# Patient Record
Sex: Female | Born: 1955
Health system: Southern US, Community
[De-identification: ages and names within clinical notes are randomized; demographics above are authoritative.]

## PROBLEM LIST (undated history)

## (undated) DIAGNOSIS — R0609 Other forms of dyspnea: Secondary | ICD-10-CM

## (undated) DIAGNOSIS — Z9981 Dependence on supplemental oxygen: Secondary | ICD-10-CM

## (undated) DIAGNOSIS — U099 Post covid-19 condition, unspecified: Secondary | ICD-10-CM

## (undated) DIAGNOSIS — R0600 Dyspnea, unspecified: Secondary | ICD-10-CM

## (undated) DIAGNOSIS — F32A Depression, unspecified: Secondary | ICD-10-CM

## (undated) DIAGNOSIS — I739 Peripheral vascular disease, unspecified: Secondary | ICD-10-CM

## (undated) DIAGNOSIS — F419 Anxiety disorder, unspecified: Secondary | ICD-10-CM

## (undated) DIAGNOSIS — J449 Chronic obstructive pulmonary disease, unspecified: Secondary | ICD-10-CM

## (undated) DIAGNOSIS — I209 Angina pectoris, unspecified: Secondary | ICD-10-CM

## (undated) DIAGNOSIS — I2510 Atherosclerotic heart disease of native coronary artery without angina pectoris: Secondary | ICD-10-CM

## (undated) DIAGNOSIS — I7090 Unspecified atherosclerosis: Secondary | ICD-10-CM

## (undated) DIAGNOSIS — C801 Malignant (primary) neoplasm, unspecified: Secondary | ICD-10-CM

## (undated) DIAGNOSIS — J189 Pneumonia, unspecified organism: Secondary | ICD-10-CM

## (undated) DIAGNOSIS — R079 Chest pain, unspecified: Secondary | ICD-10-CM

## (undated) HISTORY — PX: COLONOSCOPY: SHX5424

## (undated) HISTORY — PX: ESOPHAGOGASTRODUODENOSCOPY: SHX1529

## (undated) HISTORY — DX: Anxiety disorder, unspecified: F41.9

## (undated) HISTORY — PX: COLONOSCOPY: SHX174

## (undated) HISTORY — DX: Chest pain, unspecified: R07.9

## (undated) HISTORY — DX: Unspecified atherosclerosis: I70.90

## (undated) HISTORY — DX: Malignant (primary) neoplasm, unspecified: C80.1

## (undated) HISTORY — PX: SPINE SURGERY: SHX786

---

## 1976-07-18 HISTORY — PX: ABDOMINAL HYSTERECTOMY: SHX81

## 1986-07-18 HISTORY — PX: OVARIAN CYST REMOVAL: SHX89

## 1997-07-18 HISTORY — PX: NECK SURGERY: SHX720

## 1998-07-30 ENCOUNTER — Inpatient Hospital Stay (HOSPITAL_COMMUNITY): Admission: RE | Admit: 1998-07-30 | Discharge: 1998-07-31 | Payer: Self-pay | Admitting: Neurosurgery

## 1998-07-30 ENCOUNTER — Encounter: Payer: Self-pay | Admitting: Neurosurgery

## 2000-05-30 ENCOUNTER — Encounter: Admission: RE | Admit: 2000-05-30 | Discharge: 2000-05-30 | Payer: Self-pay | Admitting: Internal Medicine

## 2000-05-30 ENCOUNTER — Encounter: Payer: Self-pay | Admitting: Internal Medicine

## 2002-05-16 ENCOUNTER — Inpatient Hospital Stay (HOSPITAL_COMMUNITY): Admission: AD | Admit: 2002-05-16 | Discharge: 2002-05-25 | Payer: Self-pay | Admitting: Internal Medicine

## 2002-05-17 ENCOUNTER — Encounter: Payer: Self-pay | Admitting: Internal Medicine

## 2002-05-19 ENCOUNTER — Encounter (INDEPENDENT_AMBULATORY_CARE_PROVIDER_SITE_OTHER): Payer: Self-pay | Admitting: *Deleted

## 2002-05-20 ENCOUNTER — Encounter: Payer: Self-pay | Admitting: *Deleted

## 2002-05-21 ENCOUNTER — Encounter (INDEPENDENT_AMBULATORY_CARE_PROVIDER_SITE_OTHER): Payer: Self-pay | Admitting: Cardiology

## 2002-08-06 ENCOUNTER — Encounter: Payer: Self-pay | Admitting: Internal Medicine

## 2002-08-06 ENCOUNTER — Encounter: Admission: RE | Admit: 2002-08-06 | Discharge: 2002-08-06 | Payer: Self-pay | Admitting: Internal Medicine

## 2002-08-23 ENCOUNTER — Ambulatory Visit (HOSPITAL_COMMUNITY): Admission: RE | Admit: 2002-08-23 | Discharge: 2002-08-23 | Payer: Self-pay | Admitting: Obstetrics and Gynecology

## 2002-08-23 ENCOUNTER — Encounter: Payer: Self-pay | Admitting: Obstetrics and Gynecology

## 2002-10-08 ENCOUNTER — Encounter: Payer: Self-pay | Admitting: Internal Medicine

## 2002-10-08 ENCOUNTER — Encounter: Admission: RE | Admit: 2002-10-08 | Discharge: 2002-10-08 | Payer: Self-pay | Admitting: Internal Medicine

## 2002-10-28 ENCOUNTER — Emergency Department (HOSPITAL_COMMUNITY): Admission: EM | Admit: 2002-10-28 | Discharge: 2002-10-28 | Payer: Self-pay

## 2002-10-28 ENCOUNTER — Encounter: Payer: Self-pay | Admitting: Emergency Medicine

## 2003-07-19 DIAGNOSIS — Z5189 Encounter for other specified aftercare: Secondary | ICD-10-CM

## 2003-07-19 HISTORY — DX: Encounter for other specified aftercare: Z51.89

## 2004-12-09 ENCOUNTER — Ambulatory Visit: Payer: Self-pay | Admitting: Cardiology

## 2004-12-09 ENCOUNTER — Encounter: Payer: Self-pay | Admitting: Cardiology

## 2004-12-09 ENCOUNTER — Inpatient Hospital Stay (HOSPITAL_COMMUNITY): Admission: EM | Admit: 2004-12-09 | Discharge: 2004-12-11 | Payer: Self-pay | Admitting: Emergency Medicine

## 2004-12-10 ENCOUNTER — Encounter (INDEPENDENT_AMBULATORY_CARE_PROVIDER_SITE_OTHER): Payer: Self-pay | Admitting: Specialist

## 2004-12-29 ENCOUNTER — Encounter (HOSPITAL_COMMUNITY): Admission: RE | Admit: 2004-12-29 | Discharge: 2005-03-29 | Payer: Self-pay | Admitting: Cardiology

## 2005-12-09 ENCOUNTER — Inpatient Hospital Stay (HOSPITAL_COMMUNITY): Admission: AD | Admit: 2005-12-09 | Discharge: 2005-12-09 | Payer: Self-pay | Admitting: Obstetrics and Gynecology

## 2005-12-11 ENCOUNTER — Inpatient Hospital Stay (HOSPITAL_COMMUNITY): Admission: AD | Admit: 2005-12-11 | Discharge: 2005-12-11 | Payer: Self-pay | Admitting: Obstetrics and Gynecology

## 2007-09-27 ENCOUNTER — Encounter: Admission: RE | Admit: 2007-09-27 | Discharge: 2007-09-27 | Payer: Self-pay | Admitting: Neurosurgery

## 2007-10-11 ENCOUNTER — Encounter: Admission: RE | Admit: 2007-10-11 | Discharge: 2007-10-11 | Payer: Self-pay | Admitting: Neurosurgery

## 2007-10-18 ENCOUNTER — Ambulatory Visit (HOSPITAL_BASED_OUTPATIENT_CLINIC_OR_DEPARTMENT_OTHER): Admission: RE | Admit: 2007-10-18 | Discharge: 2007-10-18 | Payer: Self-pay | Admitting: Orthopedic Surgery

## 2008-02-11 DIAGNOSIS — F32A Depression, unspecified: Secondary | ICD-10-CM | POA: Insufficient documentation

## 2008-07-18 HISTORY — PX: CHOLECYSTECTOMY: SHX55

## 2008-10-03 ENCOUNTER — Inpatient Hospital Stay (HOSPITAL_COMMUNITY): Admission: EM | Admit: 2008-10-03 | Discharge: 2008-10-06 | Payer: Self-pay | Admitting: Emergency Medicine

## 2010-05-21 DIAGNOSIS — Z86718 Personal history of other venous thrombosis and embolism: Secondary | ICD-10-CM | POA: Insufficient documentation

## 2010-08-10 DIAGNOSIS — M109 Gout, unspecified: Secondary | ICD-10-CM | POA: Insufficient documentation

## 2010-10-28 LAB — CARDIAC PANEL(CRET KIN+CKTOT+MB+TROPI)
CK, MB: 2.4 ng/mL (ref 0.3–4.0)
Relative Index: INVALID (ref 0.0–2.5)
Total CK: 108 U/L (ref 7–177)
Troponin I: <0.01 ng/mL (ref 0.00–0.06)
Troponin I: <0.01 ng/mL (ref 0.00–0.06)

## 2010-10-28 LAB — BASIC METABOLIC PANEL
CO2: 22 mEq/L (ref 19–32)
Creatinine, Ser: 0.75 mg/dL (ref 0.4–1.2)
GFR calc non Af Amer: >60 mL/min (ref >60)
Glucose, Bld: 99 mg/dL (ref 70–99)

## 2010-10-28 LAB — CBC
Hemoglobin: 13.7 g/dL (ref 12.0–15.0)
MCHC: 34.8 g/dL (ref 30.0–36.0)
MCHC: 35.1 g/dL (ref 30.0–36.0)
MCV: 95.1 fL (ref 78.0–100.0)
Platelets: 208 10*3/uL (ref 150–400)
RDW: 13.9 % (ref 11.5–15.5)

## 2010-10-28 LAB — DIFFERENTIAL
Basophils Absolute: 0 10*3/uL (ref 0.0–0.1)
Eosinophils Absolute: 0.1 10*3/uL (ref 0.0–0.7)
Eosinophils Relative: 1 % (ref 0–5)
Monocytes Absolute: 0.5 10*3/uL (ref 0.1–1.0)

## 2010-10-28 LAB — PROTIME-INR
INR: 1 (ref 0.00–1.49)
INR: 1 (ref 0.00–1.49)
Prothrombin Time: 12.9 seconds (ref 11.6–15.2)
Prothrombin Time: 13.3 seconds (ref 11.6–15.2)

## 2010-10-28 LAB — LIPID PANEL
HDL: 31 mg/dL — ABNORMAL LOW (ref >39)
LDL Cholesterol: 83 mg/dL (ref 0–99)
Total CHOL/HDL Ratio: 4.4 RATIO

## 2010-10-28 LAB — CK TOTAL AND CKMB (NOT AT ARMC)
CK, MB: 2.2 ng/mL (ref 0.3–4.0)
Total CK: 49 U/L (ref 7–177)

## 2010-10-28 LAB — CULTURE, BLOOD (ROUTINE X 2)

## 2010-10-28 LAB — COMPREHENSIVE METABOLIC PANEL
BUN: 14 mg/dL (ref 6–23)
CO2: 24 mEq/L (ref 19–32)
Calcium: 8 mg/dL — ABNORMAL LOW (ref 8.4–10.5)
GFR calc Af Amer: >60 mL/min (ref >60)

## 2010-10-28 LAB — POCT CARDIAC MARKERS
Myoglobin, poc: 94 ng/mL (ref 12–200)
Troponin i, poc: <0.05 ng/mL (ref 0.00–0.09)

## 2010-10-28 LAB — APTT
aPTT: 30 seconds (ref 24–37)
aPTT: 32 seconds (ref 24–37)

## 2010-10-28 LAB — C-REACTIVE PROTEIN: CRP: 0.3 mg/dL — ABNORMAL LOW (ref <0.6)

## 2010-11-30 NOTE — Op Note (Signed)
Maria [name], SCHILLACI                ACCOUNT NO.:  [id number]   MEDICAL RECORD NO.:  [id number]          PATIENT TYPE:  AMB   LOCATION:  DSC                          FACILITY:  [facility]   PHYSICIAN:  Eulas Post, MD    DATE OF BIRTH:  [birth year]   DATE OF PROCEDURE:  10/18/2007  DATE OF DISCHARGE:                               OPERATIVE REPORT   PREOPERATIVE DIAGNOSIS:  Right adhesive capsulitis.   POSTOPERATIVE DIAGNOSIS:  Right adhesive capsulitis.   OPERATIVE PROCEDURE:  1. Right shoulder manipulation under anesthesia.  2. Right shoulder intra-articular injection of Depo-Medrol and      Marcaine.   ANESTHESIA:  General with a regional block.   OPERATIVE FINDINGS:  Preoperatively in the clinic she could only forward  flex the shoulder to about 30 degrees and she could externally rotate to  -10.  Once she was asleep I could forward  flex her to about 110 degrees  and externally rotate to about 25.  After the manipulations her forward  flexion was  to 180 degrees and she externally rotated at 0 degrees of  abduction to 70 degrees.  Her external rotation at 90 degrees of  abduction was to 100 degrees.  The internal rotation at 90 degrees was  to 10 degrees of internal rotation.   OPERATIVE PROCEDURE:  The patient was brought to the operating room and  placed in the supine position.  An interscalene block was administered.  The general anesthesia was then administered.  Manipulation under  anesthesia was performed.  First the arm was brought into external  rotation with the arm at its side.  Then we forwarded flexed maximally.  Satisfactory lysis of the adhesions was achieved audibly.  All of the  above range of motions were tested.  Cross-arm adduction was also  satisfactory.  We then prepped the Neviaser portal and injected a total  of 9 mL of Marcaine and 1 mL of Depo-Medrol into the shoulder.  This was  into the glenohumeral joint itself.  The patient tolerated the procedure  well and there was no complications.  She returned to the PACU with her  right arm elevated over her head with her hand behind her head.  She is  going to plan to begin physical therapy tomorrow to try and maintain the  motion that we have achieved here in the operating room.      Eulas Post, MD  Electronically Signed     JPL/MEDQ  D:  10/18/2007  T:  10/18/2007  Job:  [id number]

## 2010-11-30 NOTE — Discharge Summary (Signed)
NAMEKATHLEENA, [name]                ACCOUNT NO.:  [id number]   MEDICAL RECORD NO.:  [id number]          PATIENT TYPE:  INP   LOCATION:  5153                         FACILITY:  [facility]   PHYSICIAN:  Mohan N. Sharyn Lull, M.D. DATE OF BIRTH:  08/24/1955   DATE OF ADMISSION:  10/02/2008  DATE OF DISCHARGE:  10/06/2008                               DISCHARGE SUMMARY   ADMITTING DIAGNOSES:  1. Chest pain, rule out myocardial infarction.  2. Tobacco abuse.  3. Depression.  4. History of pulmonary embolism.  5. Positive family history of coronary artery disease.   DISCHARGE DIAGNOSES:  1. Possible early resolving pneumonia.  2. History of tobacco abuse.  3. Depression.  4. History of pulmonary embolism in the past.  5. Positive family history of coronary artery disease.   DISCHARGE HOME MEDICATIONS:  1. Enteric-coated aspirin 81 mg 1 tablet daily.  2. Avelox 400 mg 1 tablet daily for 7 days.  3. Nitrostat 0.4 mg sublingual use as directed.  4. Amitriptyline 1 tablet daily at night as before.   DIET:  Low-salt, low-cholesterol.   Increase activity as tolerated.   Repeat chest x-ray in 1 week.   Follow up with me in 1 week.   CONDITION AT DISCHARGE:  Stable.   BRIEF HISTORY AND HOSPITAL COURSE:  [name] is a 55 year old white  female with past medical history significant for depression, tobacco  abuse, history of PE in the past, morbid obesity, and positive family  history of coronary artery disease.  She came to the ER complaining of  retrosternal chest pain as if someone is sitting on the chest.  The  patient described the chest pain as sharp and increases occasionally  with deep breathing and radiating to the left arm, associated with  nausea and mild shortness of breath and diaphoresis.  The patient denies  exertional chest pain.  Denies palpitation, lightheadedness, or syncope.  Denies chest pain at present.  States had pulmonary embolism  approximately 6 or 7 years ago and  took Coumadin for less than 1 year.  The patient denies any cough, fever, or chills.  Denies any urinary  complaints.   PAST MEDICAL HISTORY:  As above.   PAST SURGICAL HISTORY:  Had hysterectomy in the past, cholecystectomy in  the past, and tonsillectomy in the past.   SOCIAL HISTORY:  She is married, has 4 children.  Smoked 1-pack per day  for 40 years.  No history of alcohol abuse.  Worked in Estate manager/land agent.   FAMILY HISTORY:  Positive for coronary artery disease.   PHYSICAL EXAMINATION:  GENERAL:  She is alert, awake, and oriented x3,  in no acute distress.  VITAL SIGNS:  Blood pressure 94/59, pulse was 59 and regular, and  afebrile.  HEENT:  Conjunctivae pink.  NECK: Supple.  No JVD.  No bruit.  LUNGS:  Clear to auscultation without rhonchi or rales.  CARDIOVASCULAR:  S1 and S2 was normal.  There was soft systolic murmur.  ABDOMEN:  Soft.  Bowel sounds are present and nontender.  EXTREMITIES:  There is no clubbing, cyanosis, or  edema.   LABORATORIES:  CT of the chest showed no evidence of dissection nor PE.  EKG showed normal sinus rhythm with nonspecific T-wave changes.  Her  hemoglobin was 13.7, hematocrit 39.1, and white count of 12.7.  Today's  labs; hemoglobin is 11.2, hematocrit 32.3, and white count of 8.6.  Blood cultures have been negative.  Cholesterol is 136, triglyceride  111, HDL 31 which is low, and LDL is 83.  C-reactive protein is 0.3.  Two sets of cardiac enzymes were negative.  Chest x-ray showed  questionable early developing right lower lobe infiltrate, chronic  interstitial lung changes.  Persantine Myoview scan showed no evidence  of reversible ischemia with EF of 74%.   BRIEF HISTORY AND HOSPITAL COURSE:  The patient was admitted to  Telemetry Unit.  MI was ruled out by serial enzymes and EKG.  The  patient subsequently underwent Persantine Myoview, which showed no  evidence of reversible ischemia as above.  The patient developed cough   and pleuritic chest pain, was started on IV Rocephin with improvement in  her symptoms.  The patient is eager to go home and all dressed up.  The  patient remained afebrile during the hospital stay.  We will change her  Rocephin IV to Avelox p.o.  The patient is advised to go to ED if she  develops high-grade fever or worsening cough with mucus production or  high-grade fever.  The patient will be discharged home on above  medications.  We will repeat the chest x-ray in 1 week.  The patient has  been advised to avoid smoking to which she agrees.  The patient will be  followed up in my office in 1 week.      Eduardo Osier. Sharyn Lull, M.D.  Electronically Signed     MNH/MEDQ  D:  10/06/2008  T:  10/07/2008  Job:  [id number]

## 2010-12-03 NOTE — Discharge Summary (Signed)
[name], Maria [name]                ACCOUNT NO.:  [id number]   MEDICAL RECORD NO.:  [id number]          PATIENT TYPE:  INP   LOCATION:  4743                         FACILITY:  [facility]   PHYSICIAN:  Corinna L. Lendell Caprice, MDDATE OF BIRTH:  Aug 12, 1955   DATE OF ADMISSION:  12/08/2004  DATE OF DISCHARGE:  12/11/2004                                 DISCHARGE SUMMARY   DISCHARGE DIAGNOSES:  1.  Chest slash/abdominal pain secondary to biliary colic.  2.  Cholelithiasis.  3.  Chronic cholecystitis.  4.  A history of pulmonary embolus.  5.  History of hematoma of the neck requiring  emergent tracheostomy.  6.  Tobacco abuse, counseled against.  7.  Status post laparoscopic cholecystectomy by Dr. Abbey Chatters on      12/10/2004.   CONSULTATIONS:  Dr. Luther Parody, Dr. Abbey Chatters, Dr. Sharyn Lull.   DISCHARGE MEDICATIONS:  Tylox one to two p.o. q.4 h p.r.n. pain.  She may  continue her other outpatient medications.   PROCEDURE:  As above.   FOLLOW UP:  Follow up with Dr. Abbey Chatters in 2 weeks.   ACTIVITY:  No heavy exertion or lifting for 2 weeks.   PERTINENT LABORATORY DATA:  Multiple cardiac enzymes were negative. A  complete metabolic panel was unremarkable. Coagulation panel was normal.  D-  dimer 0.48. CBC unremarkable. A CT of the chest  showed no pulmonary embolus, mild COPD, cholelithiasis with mild  intrahepatic and extrahepatic biliary ductal dilatation. chest x-ray  Negative. Ultrasound of the abdomen showed cholelithiasis and common bile  duct dilatation, suggesting common bile duct stones. Repeat chest x-ray  showed minimal basilar atelectasis. Multiple EKGs showed initially normal  sinus rhythm with possible anterior infarct. Subsequent EKG showed sinus  bradycardia. Echocardiogram showed ejection fraction of 55-65%. No regional  wall motion abnormalities, cholangiogram intraoperatively showed no stones   HISTORY AND HOSPITAL COURSE:  [name] is a pleasant 55 year old white  female, unassigned patient who presented with atypical epigastric and  substernal chest pressure and pain. She ruled out for myocardial infarction  and had a normal echocardiogram. Given her cholelithiasis and dilated common  duct there was concern that the pain may be due to biliary colic. GI was  consulted and recommended surgery consult. Surgery agreed and performed a  laparoscopic cholecystectomy. When the patient awoke the PACU area she began  complaining of 10 at of 10 severe  crushing substernal chest pressure. Workup was negative. However, cardiology  was consulted and felt that this was not a cardiac issue but that supportive  care should be done. The patient remained in sinus rhythm on telemetry he  had no further chest pain and is being discharged to home.      CLS/MEDQ  D:  12/11/2004  T:  12/11/2004  Job:  [id number]

## 2010-12-03 NOTE — Consult Note (Signed)
NAME:  Maria [name], Maria [name]                          ACCOUNT NO.:  [id number]   MEDICAL RECORD NO.:  [id number]                   PATIENT TYPE:  INP   LOCATION:  4706                                 FACILITY:  [facility]   PHYSICIAN:  Lowell C. Catha Gosselin, M.D.               DATE OF BIRTH:  07-13-1956   DATE OF CONSULTATION:  05/21/2002  DATE OF DISCHARGE:                                   CONSULTATION   REASON FOR CONSULTATION:  Hypercoagulable workup.   REFERRING PHYSICIAN:  Antony Madura, M.D.   HISTORY OF PRESENT ILLNESS:  The patient is a 55 year old woman who  presented on May 16, 2002, with sudden onset of shortness of breath and  chest pain.  Chest CT (done at [facility]) revealed pulmonary embolism  with a fairly large filling defect demonstrated in the proximal right  pulmonary artery.  The patient was admitted to [facility] and  started on heparin.  Abdominal/pelvic CTs were done to evaluate for a  possible occult malignancy with findings of biliary obstruction,  cholelithiasis, COPD, linear atelectasis or scar formation both posterior  lung bases and two possible small rectal masses posteriorly on the right.  A  GI consult was obtained with the patient evaluated by Dr. Luther Parody.  She  underwent EGD which showed a questionable prominent ampulla which was  brushed with cytology from the [id number] (negative for malignant cells) and  an otherwise normal examination.  Colonoscopy was negative with no pathology  seen in the rectal area.  MRCP showed common bile duct dilatation down to  the distal most common bile duct and extensive cholelithiasis.  Pelvic  ultrasound showed no evidence of pelvic or adnexal mass, normal appearance  of the right ovary, nonvisualization of the left ovary, and patient noted to  be status post hysterectomy.  Lower extremity venous Dopplers were negative  for DVT.  Results of a two-dimensional echocardiogram are pending.  Surgery  is  following due to cholelithiasis, elevated LFTs with no surgery planned  presently.  The patient is currently heparin and being converted to  Coumadin.   PAST MEDICAL HISTORY:  1. Migraines.  2. Status post partial hysterectomy in 1979; status post what the patient     thought was removal of both ovaries 15 years ago.  3. Status post neck surgery.   MEDICATIONS:  Heparin and Coumadin.   HOME MEDICATIONS:  Herbal estrogen product for the past two weeks.  Prior to  that the patient had been on a prescription estrogen medication.   ALLERGIES:  No known drug allergies.   FAMILY HISTORY:  Mother is living and has a history of diabetes mellitus,  MI, CAD status post CABG. Father has a history of COPD; four brothers and  one sister living all reported to be in good health.  Maternal grandmother  deceased with stomach cancer.   SOCIAL HISTORY:  The patient lives  in [place]. She is married.  She has  four children. She is employed as a Sport and exercise psychologist. She reports a  positive tobacco history stating that she quit smoking six weeks ago at 1/2  pack per day for 35 years. She denies EtOH use.    GYN HISTORY:  1. Pap/pelvic examination in September of 2003; negative.  2. Mammogram in November of 2001; negative.  3. Hormone replacement therapy for 15 years up until approximately two to     four weeks ago at which time she started an over-the-counter herbal     estrogen supplement.   She denies no personal or family history of miscarriage/difficulty  conceiving.   REVIEW OF SYSTEMS:  The patient denies any weight loss or anorexia. Her  energy has been poor for the past several weeks. She denies any fever or  nightsweats.  On the day of admission, she reports sudden onset of chest  pain and shortness of breath.  She denies any unusual headaches or visual  changes. She denies any bleeding. She denies any calf pain or lower  extremity swelling.  She has had no recent change in her  bowel habits and  denies any rectal bleeding. She has had no hematuria or dysuria.  She denies  any vaginal discharge or bleeding. She has noticed no breast masses.  She  does report breast tenderness approximately two weeks.   PHYSICAL EXAMINATION:  VITAL SIGNS:  Temperature 99.2, heart rate 60,  respirations 20, blood pressure 85/51, oxygen saturation 97% on room air.  GENERAL:  Pleasant, well-nourished, Caucasian female in no acute distress.  HEENT:  Normocephalic and atraumatic.  Pupils equal, round, and reactive to  light.  Extraocular movements are intact.  Sclerae anicteric.  Oropharynx is  clear; full upper dentures, partial lower plate.  LYMPH: No palpable cervical, supraclavicular, axillary, or inguinal lymph  nodes.  CHEST:  Breath sounds are markedly diminished.  No breast masses palpated.  HEART:  Regular rate and rhythm.  ABDOMEN:  Soft, tender over the right upper quadrant.  EXTREMITIES:  No edema; calves are soft and nontender.  NEUROLOGY:  Alert and oriented x3.  Motor strength is 5/5.   LABORATORY DATA:  Hemoglobin 10.7, white count 7, platelets 206,000, MCV 96,  total bilirubin 0.8, alkaline phosphatase 92, SGOT 64, SGPT 174, total  protein 5.7, albumin 3.0. CEA less than 0.5 and AFP 3.9.   ADMISSION LABS:  Hemoglobin 12.2, white count 9.3, platelets 265,000. Sodium  135, potassium 3.5, BUN 13, creatinine 0.8, glucose 97, calcium 8.2, total  protein 6.1, albumin 3.2, alkaline phosphatase 50, SGOT 37, SGPT 30.   RADIOLOGY:  1. Chest CT; large filling defect proximal right pulmonary artery. There may     be some minimal clot formation in the proximal left pulmonary artery as     well.  2. Abdominal CT; biliary obstruction, cholelithiasis, linear atelectasis or     scar formation at both posterior lung bases, COPD.  3. Pelvic CT; two possible small rectal masses with associated mild     perirectal adenopathy.  IMPRESSION:  The patient is a 55 year old woman who  presented with sudden  onset of chest pain and dyspnea and was found to have pulmonary emboli, as  well as biliary obstruction and cholelithiasis.  Two weeks prior she had  started an over-the-counter herbal estrogen product.   Lower extremity Dopplers were negative for evidence of DVT.  Colonoscopy was  negative.  EGD with question of prominent ampulla with negative  cytology.  Pelvic ultrasound was negative for mass.  Two-dimensional echocardiogram  results are pending. We will begin a hypercoagulable workup and chest factor  V Leiden, prothrombin gene mutation, and anticardiolipin antibody panel.  She will need at least six months of Coumadin therapy.  If she is found to  have a hypercoagulable state, she would need lifelong anticoagulation.  We  will arrange for her to have follow-up in approximately four weeks with Dr.  Truett Perna to follow-up on the pending studies and potentially check an AT3  level (off heparin). We cannot check protein S and C levels until she is off  of Coumadin.  We would also recommend that she have a mammogram as an  outpatient.  We will continue to follow with you. The patient is seen and  examined by Dr. Catha Gosselin; chart reviewed.     Lonna Cobb, N.P.                         Quintin Alto C. Catha Gosselin, M.D.    LT/MEDQ  D:  05/23/2002  T:  05/24/2002  Job:  [id number]   cc:   Antony Madura, M.D.  [address]  [place]  [place] [address]  Fax: [phone]   Velora Heckler, M.D.  [phone] [address] [place]  [place] [address]  Fax: [phone]   Althea Grimmer. Luther Parody, M.D.  [address]  [place]  [place] [address]  Fax: [phone]

## 2010-12-03 NOTE — Consult Note (Signed)
NAME:  Maria [name], Maria [name]                          ACCOUNT NO.:  [id number]   MEDICAL RECORD NO.:  [id number]                   PATIENT TYPE:  INP   LOCATION:  4706                                 FACILITY:  [facility]   PHYSICIAN:  Althea Grimmer. Luther Parody, M.D.            DATE OF BIRTH:  [birth year]   DATE OF CONSULTATION:  05/18/2002  DATE OF DISCHARGE:                                   CONSULTATION   REASON FOR CONSULTATION:  The patient is a 55 year old female who entered  the hospital on May 16, 2002 with sudden onset of substernal chest  discomfort and shortness of breath.  This was established to be secondary to  pulmonary embolism seen on chest CT and she is currently heparinized.  She  denies any obvious overt reason for her pulmonary emboli.  She has no  gastrointestinal symptoms, extremity swelling, trauma, or cellulitis.  She  specifically denies change in appetite, dysphagia, heartburn, epigastric or  lower abdominal pain, constipation, diarrhea, melena, or hematochezia.  She  says she has recently lost 10 pounds through intentional dieting.  She has  been a smoker and quit smoking five years ago with the use of an over-the-  counter herbal supplement.  In addition, she is status post hysterectomy in  the remote past.  She was on prescribed estrogen supplements but began  taking an over-the-counter herbal estrogen several weeks ago.   PAST MEDICAL HISTORY:  Pertinent for migraines.   PAST SURGICAL HISTORY:  Total hysterectomy.  It is noted that her ovaries  were removed, yet the recent pelvic CT suggests that ovaries are still in  place and this needs to be sorted out with the radiologist.]   CURRENT MEDICATIONS:  At home include natural estrogen.  In hospital, she  has been on heparin drip.  Coumadin has just been started and she is on  Xanax.   ALLERGIES:  She denies any allergies.   FAMILY HISTORY:  Notable for a mother with gallstones and a father with  ulcer disease  requiring partial gastrectomy and two grandparents with  stomach cancer.   REVIEW OF SYMPTOMS:  GENERAL:  A 10 pound intentional weight loss.  No night  sweats.  ENDOCRINE:  No known diabetes or thyroid problems.  SKIN:  No rash  or pruritus.  EYES:  No icterus or change in vision.  ENT:  No aphthous,  ulcers, or chronic sore throat.  RESPIRATORY:  Recent onset of shortness of  breath with exertion.  See above.  CARDIAC:  No palpitations or history of  valvular heart disease.  GASTROINTESTINAL:  As above.  GENITOURINARY:  No  dysuria or hematuria.  Remainder of review of systems is negative.   PHYSICAL EXAMINATION:  GENERAL:  She is a well-developed, well-nourished  adult female in no acute distress.  VITAL SIGNS:  Afebrile.  Blood pressure 99/58, pulse is 55 and regular.  SKIN:  Normal.  HEENT:  Eyes are anicteric.  Oropharynx reveals upper dentures without  aphthous ulcers.  NECK:  Supple without thyromegaly or adenopathy.  There is no cervical or  inguinal adenopathy.  CHEST:  Clear.  HEART:  Regular rate and rhythm without murmurs, rubs, or gallops.  ABDOMEN:  Soft without mass or organomegaly.  There is mild right lower  quadrant tenderness to deep palpation.  There is no rebound or hernia.  Bowel sounds are normal.  RECTAL:  Not performed.  EXTREMITIES:  Without clubbing, cyanosis, edema, or rash.   LABORATORY DATA:  Laboratory test reveal hemoglobin of 12.1 with a white  blood count of 9.4, platelet count is normal.  Prothrombin time is 14, PTT  161, albumin 3.2.  Liver function tests are completely normal.  CT scan  suggests COPD with pulmonary emboli.  Abdominal CT scan demonstrates biliary  dilatation with a common bile duct of 12.6 mm.  There are stones within the  gallbladder.  There is no pancreatic head mass seen.  There are perirectal  small masses versus adenopathy seen.   IMPRESSION:  A 55 year old female who presented with pulmonary emboli of  unclear etiology.   Possibilities would include an undetected malignancy or  possible even these herbal supplements which she has been using which could  affect her coagulation status.  Her CT scan of the abdomen and pelvis is of  some concern in that the common bile duct is considerably dilated and she  does have gallstones, yet no obstruction point is seen.  The question would  be raised of an ampullary mass.  Her family history of stomach cancer and  ulcer disease is also of concern.  The CT scan of the rectum raises a  question of a rectal masses or perirectal adenopathy.   RECOMMENDATIONS:  I believe the patient should have both upper and lower  endoscopy in order to rule out neoplastic lesions of the bowel, the stomach,  or the ampulla.  She may indeed require ERCP or MRCP to further sort out the  reason for dilation of her bowel duct.  Her gallstones at this point seem to  be asymptomatic.  Both upper and lower endoscopy are discussed with the  patient in terms of technique, preparation, and risks of complications  including bleeding and perforation.  She agrees to proceed and they will be  performed tomorrow morning after a clear liquid diet and Phospho-Soda prep.  Please see the orders.  Her heparin will be held beginning at 4 a.m.  May 19, 2002 and Coumadin should not be started until her  gastrointestinal evaluation is completed.  The CT scan will be reviewed with  the radiologist in order to clarify what he read as ovaries which should not  be present.                                                Althea Grimmer. Luther Parody, M.D.    PJS/MEDQ  D:  05/18/2002  T:  05/20/2002  Job:  [id number]   cc:   Antony Madura, M.D.  [address]  [place]  [place] [address]  Fax: [phone]

## 2010-12-03 NOTE — Consult Note (Signed)
NAME:  Maria [name], Maria [name] NO.:  [id number]   MEDICAL RECORD NO.:  [id number]                   PATIENT TYPE:  INP   LOCATION:  4706                                 FACILITY:  [facility]   PHYSICIAN:  Velora Heckler, M.D.                DATE OF BIRTH:  08-Aug-1955   DATE OF CONSULTATION:  05/20/2002  DATE OF DISCHARGE:                                   CONSULTATION   PRIMARY CARE PHYSICIAN:  Antony Madura, M.D.   REASON FOR CONSULTATION:  Cholelithiasis, chest pain, abnormal liver  function tests, biliary dilatation.   HISTORY OF PRESENT ILLNESS:  The patient is a 55 year old white female in  her normal state of good health until May 16, 2002, when she experienced  sudden onset of substernal chest pain. The patient notes pain to be  substernal and right anterior chest wall radiating directly to the back.  This occurred with concurrent shortness of breath.  The patient was seen by  Dr. Su Hilt and underwent CT scan of the chest at [facility]. This  demonstrated a right pulmonary artery embolism.  The patient was admitted  emergently and started on a heparin infusion.  Work-up for source of  pulmonary embolism included laboratory studies, a CT scan of the chest, a CT  scan of the abdomen, an upper and lower endoscopy.  CT scan of the abdomen  demonstrated a dilated common bile duct at 12 mm.  The patient, on May 19, 2002, developed mildly elevated liver transaminases.  Subsequently MR  cholangiogram was performed today, May 20, 2002, which was negative for  common bile duct stones.  The patient does have gallstones. The patient also  underwent ERCP with brushings and cytology was negative for malignancy.  General surgery is now consulted for consideration of cholecystectomy and  recommendations regarding pulmonary embolism.   PAST MEDICAL HISTORY:  Status post abdominal hysterectomy 20 years ago.  The  patient was told both ovaries were  removed but pelvic CT scan suggests that  the right ovary remains and appears normal.   MEDICATIONS:  Estrogen supplementation.   ALLERGIES:  No known drug allergies.   SOCIAL HISTORY:  The patient is married.  She denies alcohol use.  She  denies drug abuse.  She is a smoker, having a one pack a day history for 35  years and quit four to five weeks ago while taking an herbal supplement.   FAMILY HISTORY:  This is notable for gallstones in the patient's mother and  sisters both of whom required cholecystectomy.   REVIEW OF SYSTEMS:  A 15-system review is without significant other  positives except as noted above.   PHYSICAL EXAMINATION:  GENERAL APPEARANCE:  A 55 year old well-developed,  well-nourished white female on ward 4700 at [place]. [facility].  VITAL SIGNS:  Afebrile.  Vital signs stable.  HEENT:  Normocephalic and atraumatic.  Sclerae are clear.  Dentition is  good.  Voice quality is normal.  NECK:  Symmetric. Thyroid is normal without nodularity.  There is no  anterior or posterior cervical adenopathy.  There are no supraclavicular  masses.  LUNGS:  Clear to auscultation, although breath sounds are diminished on the  right secondary to splinting due to pain. There is no rub on auscultation.  There is no costovertebral angle tenderness.  CARDIOVASCULAR:  Regular rate and rhythm.  ABDOMEN:  Soft without distention.  There are no masses.  There is no  tenderness.  There are bowel sounds present.  EXTREMITIES:  Nontender without edema.  There is no ecchymosis.  There are  no palpable cords in either calf.  There is no Homan's sign bilaterally.  NEUROLOGIC:  The patient is alert and oriented without focal deficit.   LABORATORY DATA:  May 19, 2002, white count 8.2, hemoglobin 12.4. SGOT  56, SGPT 219.   IMPRESSION:  1. Pulmonary embolus to right pulmonary artery of undetermined source or     etiology.  2. Cholelithiasis.  3. Elevated liver function  tests.  4. Dilated common bile duct.   PLAN:  1. Continue heparin drip.  2. Repeat liver function tests and serum amylase levels in a.m. on May 21, 2002.  3. Have ordered venous Duplex study and vascular laboratory of the lower     extremities to rule out deep venous thrombosis as source of pulmonary     embolus.  4. Consider hematology consult to work up procoagulable states including     protein C, protein S and antithrombin III among others.  5. Consider cardiac echo if venous Duplex of the lower extremities is     negative to rule out an intercardiac thrombus or patent foramen ovale.  6. If liver function tests decrease to normal range and the patient remains     asymptomatic from gallstones, I would hold off on cholecystectomy for the     foreseeable future and treat with therapeutic anticoagulation for six     months or as per recommended by hematology.  Certainly if the patient     becomes symptomatic from her underlying cholelithiasis, we will have to     consider interrupting her anticoagulation and preceding with     cholecystectomy and cholangiography.                                               Velora Heckler, M.D.    TMG/MEDQ  D:  05/20/2002  T:  05/21/2002  Job:  [id number]   cc:   Antony Madura, M.D.  [address]  [place]  [place] [address]  Fax: [phone]   Althea Grimmer. Luther Parody, M.D.  [address], [address]  [place]  [place] [address]  Fax: [phone]

## 2010-12-03 NOTE — H&P (Signed)
NAMEPALMER, SHOREY                ACCOUNT NO.:  [id number]   MEDICAL RECORD NO.:  [id number]          PATIENT TYPE:  EMS   LOCATION:  MAJO                         FACILITY:  [facility]   PHYSICIAN:  Melissa L. Ladona Ridgel, MD  DATE OF BIRTH:  May 20, 1956   DATE OF ADMISSION:  12/08/2004  DATE OF DISCHARGE:                                HISTORY & PHYSICAL   CHIEF COMPLAINT:  Chest pain, acute in onset.   PRIMARY CARE PHYSICIAN:  [facility], Dr. Estevan Oaks.  She is scheduled  to see him again on December 30, 2004.   HISTORY OF PRESENT ILLNESS:  The patient is a 55 year old white female with  a past medical history only for a pulmonary embolism, likely related to  being on hormone therapy.  She was sitting in church this evening when she  developed the acute onset of sharp chest discomfort located in the xiphoid  area which is where she points.  She states it went to her back and it was  associated with lightheadedness, sweating, slight nausea which resolved.  No  changes in her vision or headache.  She had no pain in her arms or neck.  The patient was given nitroglycerin in the EMS rig which eased her pain, but  not the pressure.   REVIEW OF SYSTEMS:  Reveals no fever or chills.  No history of reflux or  heartburn.  No shortness of breath.  She is currently having  a lot of job  and home stress.  She denies dysuria, constipation, diarrhea, myalgias.  All  other review of systems are negative.   PAST MEDICAL HISTORY:  As stated she had a pulmonary embolism secondary to  likely hormone therapy with the diagnosis.  She denies any hypertension or  diabetes.   PAST SURGICAL HISTORY:  She has had a hysterectomy.  She had an emergent  trache secondary to a hematoma that formed spontaneously while she was on  Coumadin being treated for a blood clot.  Since that event, she has not had  any blood thinners.   SOCIAL HISTORY:  She smokes 1/2 pack a day of cigarettes.  She denies any  ethanol or  illicit drug use.  Mother is living with CABG x4, diabetes, and  dialysis.  Father is deceased secondary to emphysema.   ALLERGIES:  No known drug allergies.   MEDICATIONS:  She takes no medications or supplements.   PHYSICAL EXAMINATION:  VITAL SIGNS:  Temperature is 98.4, blood pressure  107/49, pulse is 65, respirations 16, saturation is 100% on 2 liters.  GENERAL:  This is a well-developed, well-nourished, white female in no acute  distress.  She describes a mild pressure sensation at the xiphoid area, but  states that the pain is no longer there.  HEENT:  She is normocephalic and atraumatic.  Pupils equal, round, and  reactive to light.  Extraocular muscles are intact.  She does have dentures.  NECK:  Supple.  There is no JVD.  No lymphadenopathy and no carotid bruits.  CHEST:  Crackles at the left base that clear with deep inspiration.  She has  no rhonchi, rales, or wheezes.  HEART:  Regular rate and rhythm.  Positive S1 and S2.  No S3 or S4.  No  murmurs, rubs, or gallops.  ABDOMEN:  Soft, nondistended, with minimal tenderness in the mid epigastrium  area which is reproducible with palpation over the xiphoid and lower sternal  area.  She has positive bowel sounds and no evidence for hepatosplenomegaly.  EXTREMITIES:  No cyanosis, clubbing, or edema.  NEUROLOGY:  Awake, alert and oriented x3.  Cranial nerves II-XII grossly  intact.  Power is 5/5.  DTR's are 2+.  Sensation is grossly intact.   LABORATORY DATA:  Sodium 138, potassium 3.6, chloride 108, CO2 25.  D-dimer  within normal limits.  BUN is 17, creatinine 0.7, glucose is 103, hemoglobin  is 13.6, hematocrit is 40.0.  Point of care enzymes are negative x2.   The CT scan of the chest shows some ductal dilatation and cholelithiasis.  Negative for PE.  X-ray shows COPD.   EKG is 84 beats per minute with no acute ST T wave changes.   ASSESSMENT:  This is a 55 year old white female with a past medical history  for PE who  felt to be secondary to hypercoagulable state related to taking  hormones.  The patient also had a complicated course related to being  anticoagulated and then she developed a spontaneous hematoma that  compromised her airway requiring a tracheostomy.  The patient presents with  the acute onset of chest pain/pressure.  Possible etiologies mainly cardiac,  but the CT shows cholelithiasis with extrahepatic ductal dilatation and I  question whether or not she may be passing a gallstone.  CT is negative for  pulmonary embolism.   Problem 1.  Cardiovascular.  We will admit her to telemetry, continue serial  cardiac enzymes, nitroglycerin for now.  Continue her aspirin.  At this  time, I will not anticoagulate her unless her cardiac enzymes become  positive.  She has had a history of bleeding spontaneously with hematoma  formation.  Will add Protonix and I will write an order for a low dose beta  blocker with parameters as her heart rate is 65 and she may not tolerate  this.  Will check a fasting lipid panel and risk factors like hemoglobin  A1C.   Problem 2.  GI.  I have a higher suspicion for possible passage of  gallstone.  Will check LFT's, amylase and lipase, and PPI and consider  giving her a low dose morphine for pain.   Problem 3.  GU.  Check a UA, C&S, and UDS.   Problem 4.  Endocrine.  There is no current issue.  Will check a hemoglobin  A1C and fasting lipid panel as stated.   Problem 5.  Pulmonary.  She has a history of PE related to hormone use.  She  has been off hormones for some time and has not been on any blood thinners  secondary to the hematoma that compromised her airway back in 2003 with her  original PE.   Problem 6.  DVT prophylaxis.  For now with SED.     MLT/MEDQ  D:  12/09/2004  T:  12/09/2004  Job:  [id number]   cc:   Estevan Oaks, M.D., [facility]

## 2010-12-03 NOTE — Op Note (Signed)
Maria [name], [name]                ACCOUNT NO.:  [id number]   MEDICAL RECORD NO.:  [id number]          PATIENT TYPE:  INP   LOCATION:  4743                         FACILITY:  [facility]   PHYSICIAN:  Adolph Pollack, M.D.DATE OF BIRTH:  1956-06-07   DATE OF PROCEDURE:  12/10/2004  DATE OF DISCHARGE:                                 OPERATIVE REPORT   PREOPERATIVE DIAGNOSIS:  Symptomatic cholelithiasis.   POSTOPERATIVE DIAGNOSIS:  Symptomatic cholelithiasis with chronic  cholecystitis.   PROCEDURE:  Laparoscopic cholecystectomy with intraoperative cholangiogram.   SURGEON:  Adolph Pollack, M.D.   ASSISTANT:  Gabrielle Dare. Janee Morn, M.D.   ANESTHESIA:  General anesthesia.   INDICATIONS FOR PROCEDURE:  Maria [name] is a 55 year old female who has had  known gallbladder disease and even a history of common bile duct dilatation.  She had some substernal right-sided chest-type pain and was admitted on Dec 09, 2004.  She was ruled out for cardiac disease.  Of note was that she had  an ultrasound which again substantiated gallstones and mildly dilated common  bile duct.  Liver function tests were negative.  Cardiac work-up has been  complete.  Now she presents for laparoscopic cholecystectomy.  The procedure  and the risks were discussed with her preoperatively.   DESCRIPTION OF PROCEDURE:  She is seen in the holding area and brought to  the operating room, placed supine on the operating table and general  anesthetic was administered.  The abdominal wall was sterilely prepped and  draped.  Dilute Marcaine solution was infiltrated in the subumbilical region  and a small subumbilical incision was made through the skin and subcutaneous  tissue, fascia and peritoneum, entering the peritoneal cavity under direct  vision.  The pursestring suture of 0 Vicryl was placed around the fascial  edges.  A Hasson trocar was introduced to the peritoneal cavity and  pneumoperitoneum was created by  insufflation of CO2 gas.   Next, the laparoscope introduced.  She was placed in reverse Trendelenburg  position, the right side tilted slightly upward.  An 11 mm trocar was placed  through an epigastric incision and two 5 mm trocars placed through the right  mid abdomen.  The fundus of the gallbladder was grasped and had chronic  inflammatory changes.  It was retracted toward the shoulder.  There were  omental duodenal adhesions which were taken down without injury to the  duodenum.  The infundibulum was then grasped and retracted laterally.  Using  blunt dissection the infundibulum was mobilized.  I then isolated the cystic  duct.  I created a window around it.  A clip was placed in the cystic duct  gallbladder junction.  A small incision was made in the cystic duct with  some reflux of bile.  A cholangiocatheter was passed through the anterior  abdominal wall, placed into the cystic duct and a cholangiogram was  performed.   Under real time fluoroscopy, dilute contrast material was injected into the  cystic duct which was a moderate to long length.  Common bile duct appeared  to be slightly dilated but I  did not see any obvious obstructing stones.  Common hepatic, small mound left hepatic and right hepatic ducts were also  seen.  Final report were pending radiologist interpretation.   The cholangiocatheter was removed, and the cystic duct was clipped three  times proximally and divided.  I then identified an anterior and posterior  branch of the cystic artery, clipped them and divided them.  The gallbladder  was dissected free from the liver bed intact using electrocautery.  It was  placed in an Endopouch bag.   The gallbladder fossa was irrigated.  Bleeding points controlled with  cautery.  It was irrigated again and inspected and no bile leakage or  bleeding was noted.  The irrigation fluid was evacuated.   The gallbladder and the Endopouch bag was removed through the  subumbilical  port and the subumbilical fascial defect was closed by tightening up and  tying down the pursestring suture.  The remaining trocars were removed and a  pneumoperitoneum was released.  Skin incisions were closed with 4-0 Monocryl  subcuticular stitches followed by Steri-Strips and sterile dressings.   She tolerated the procedure well without any apparent complications and was  taken to the recovery room in satisfactory condition.      TJR/MEDQ  D:  12/10/2004  T:  12/10/2004  Job:  [id number]   cc:   Althea Grimmer. Luther Parody, M.D.  [address], [address]  [place]  [place] [address]  Fax: [phone]

## 2010-12-03 NOTE — Consult Note (Signed)
NAMESERENIDY, WALTZ                ACCOUNT NO.:  [id number]   MEDICAL RECORD NO.:  [id number]          PATIENT TYPE:  INP   LOCATION:  1823                         FACILITY:  [facility]   PHYSICIAN:  Althea Grimmer. Santogade, M.D.DATE OF BIRTH:  1956-04-18   DATE OF CONSULTATION:  12/09/2004  DATE OF DISCHARGE:                                   CONSULTATION   Maria [name] is a 55 year old female whom I am asked to see for chest discomfort  and gallstones with an abnormal imaging of her biliary system.  She  presented to the emergency room last night complaining of substernal chest  pain.  She was treated with morphine and IV nitroglycerin.  She says that  the sharp component of the chest discomfort is gone but she still feels like  there is a load of bricks sitting on her chest.  An EKG does not clearly  show any ischemic changes, however, it is read as showing rule out  anteroseptal MI of indeterminate duration.  She previously had a history of  a pulmonary embolism with chest pain.  She is not on anticoagulation at  present and a current CT scan is negative for PE.  In November of 2003, when  she presented with chest discomfort and had a pulmonary embolism, she was  noted to have an abnormal imaging of her biliary system with a common bile  duct that was dilated to at least 1.2 cm.  An MRI was performed that was not  a definite bile duct stone seen, although she had gallstones, she has not  had a cholecystectomy.  She currently denies epigastric pain, nausea or  vomiting, or melena.  Liver function tests, amylase and lipase are normal.  Currently, both ultrasound and CT scan demonstrate a contracted gallbladder  with multiple stones.  Her CBD is 9 mm.  No common bile duct stones are  demonstrated, however.  In November of 2003, she also had an upper endoscopy  that was normal.  Cytology of the ampullary area, which seemed somewhat  prominent was negative.  Colonoscopy was negative.   PAST MEDICAL  HISTORY:  1.  Migraines.  2.  Total abdominal hysterectomy.  3.  Above noted  history of pulmonary embolism.   CURRENT MEDICATIONS:  None.   ALLERGIES:  None.   FAMILY HISTORY:  Pertinent for gallstones.   SOCIAL HISTORY:  She is married.  Smokes a pack of cigarettes per day.  Does  not drink alcohol.   REVIEW OF SYMPTOMS:  GENERAL:  No weight loss or night sweats.  ENDOCRINE:  No history of diabetes or thyroid problems.  SKIN:  No rash or pruritus.  EYES:  No icterus or change in vision.  ENT:  No aphthous ulcers or chronic  sore throat.  RESPIRATORY:  No shortness of breath, cough or wheezing.  CARDIAC:  See above.  GI:  See above.  GU:  No dysuria or hematuria.  The  remainder of her review of systems is negative.   PHYSICAL EXAMINATION:  GENERAL APPEARANCE:  She is a well-developed, well-  nourished adult female  appearing in no acute distress.  VITAL SIGNS:  Afebrile, blood pressure 140/70, pulse 96.  SKIN:  Normal.  HEENT:  Eyes:  Anicteric.  Oropharynx unremarkable without aphthous ulcers.  NECK:  Supple without thyromegaly.  There is no cervical adenopathy.  CHEST:  Clear.  CARDIOVASCULAR:  Regular rate and rhythm.  ABDOMEN:  Soft with active bowel sounds.  There is minimal right upper  quadrant tenderness to deep palpation. There is no mass, rebound or hernia.  RECTAL:  Exam not performed  EXTREMITIES:  Without clubbing, cyanosis, or edema.  No rash.   IMPRESSION AND RECOMMENDATIONS:  I suspect this female's chest discomfort  and minimal right upper quadrant discomfort is indeed related to gallstones.  She may have passed a common bile duct stone or may indeed still be passing  one.  However, with ultrasound and CT scan that do not image a stone in the  common bile duct and with normal liver function tests and lipase, I do not  think there are strong indications at this time to proceed with an ERCP.  Similarly, an MRCP might not be necessary at this time.  I would  suggest  surgical consultation for laparoscopic cholecystectomy if her cardiac status  remains stable.  Repeat liver function tests and lipase are ordered to make  certain she does not have a developing biliary obstruction or cholangitis.       PJS/MEDQ  D:  12/09/2004  T:  12/09/2004  Job:  [id number]

## 2010-12-03 NOTE — Consult Note (Signed)
Maria Maria [name], Maria [name]                ACCOUNT NO.:  [id number]   MEDICAL RECORD NO.:  [id number]          PATIENT TYPE:  INP   LOCATION:  4743                         FACILITY:  [facility]   PHYSICIAN:  Adolph Pollack, M.D.DATE OF BIRTH:  Aug 13, 1955   DATE OF CONSULTATION:  DATE OF DISCHARGE:                                   CONSULTATION   REFERRING PHYSICIAN:  Althea Grimmer. Luther Parody, M.D.   REASON FOR CONSULTATION:  Substernal and epigastric pain, cholelithiasis,  dilated common bile duct.   HISTORY OF PRESENT ILLNESS:  [name] is a 55 year old female who had the  abrupt onset of lower substernal chest pain that was sharp in nature,  radiating to the back and had a little radiation to the left shoulder.  Once  the sharp pain subsided, then she felt a pressure type sensation in the  epigastrium and substernal area.  No nausea.  No fever or chills.  Of note  was that she had had a previous episode like this and was felt to have  cholelithiasis and possible had passed a common duct stone, however, she is  also diagnosed with a pulmonary embolism at that time and placed on  anticoagulation.  She was lost to follow-up with respect to her  cholecystectomy which was recommended at the time.  She presented to the  emergency department and a slight abnormality of the EKG was noted.  She  subsequently was admitted to the hospital to rule out a myocardial  infarction but I was asked to see her as well for this possibly being  biliary colic in nature.   PAST MEDICAL HISTORY:  1. Pulmonary embolus.  2. Spontaneous bleed in the neck secondary to overanticoagulation.  3. Pneumonia.     PAST SURGICAL HISTORY:  1. Tracheostomy.  2. Hysterectomy.     ALLERGIES:  None.   MEDICATIONS:  None.   SOCIAL HISTORY:  Married.  Smokes a half pack of cigarettes a day.  Denies  alcohol use.  Works at [employer].   FAMILY HISTORY:  Positive for some heart disease as well as strokes and  diabetes.   REVIEW OF SYMPTOMS:  CARDIAC:  She denies any known heart disease or  hypertension.  PULMONARY:  She denies any COPD, denies any asthma.  GI:  She  denies peptic ulcer disease, hepatitis, diverticulitis.  She does have known  cholelithiasis and had a common bile duct dilatation in the past up to 1.2  cm.  Because of the pulmonary embolus and complications of anticoagulation,  she was not able to make arrangements for elective cholecystectomy.  GU:  No  kidney stones or urinary tract infections.  ENDOCRINE:  Denies thyroid  disease, diabetes, hypercholesterolemia.  NEUROLOGIC:  Denies strokes or  seizures.  HEMATOLOGIC:  He has had a blood transfusion and pulmonary  embolism.  Currently no longer anticoagulated.   PHYSICAL EXAMINATION:  GENERAL APPEARANCE:  A well-developed, well-nourished  female who is in no acute distress.  Very pleasant and cooperative.  VITAL SIGNS:  Temperature 97.7, pulse 60, blood pressure 87/54.  HEENT:  Eyes:  Extraocular movements intact.  There is no icterus present.  NECK:  A lower midline scar.  No palpable thyromegaly.  LUNGS:  Breath sounds are equal and clear.  Respirations unlabored.  CARDIOVASCULAR:  Heart demonstrates a regular rate and rhythm, no murmur  heard, no JVD or lower extremity edema.  ABDOMEN:  Soft.  Lower transverse scar is present.  There is mild epigastric  and right upper quadrant tenderness but no guarding and no Murphy's sign.  No hepatomegaly.  No palpable masses.  MUSCULOSKELETAL:  She has good muscle tone, full range of motion, no back  lesions.  SKIN:  No jaundice.   LABORATORY DATA:  Liver function tests are normal.  Lipase normal.  White  cell count 10,000 within normal range, hemoglobin 11.6.   Ultrasound was reviewed and demonstrates some cholelithiasis with a  contracted gallbladder.  There is no pericholecystic fluid and no  gallbladder wall thickening.  There is a dilated common bile duct measuring  about 9  mm but no obvious stone seen.   IMPRESSION:  Substernal chest pain/epigastric pain - nature of the pain  seems to be more consistent with biliary colic.  So far cardiac enzymes are  negative, however, this needs to be ruled out.  She did have a CT to rule  out a recurrent pulmonary embolism and this is negative as well.  If the  cardiac work-ups end up being negative in the next  eight to 12 hours, then  I have suggested she could proceed with laparoscopic cholecystectomy.  I  went over the  procedure and the risks.  The risks include but are not limited bleeding,  infection, wound healing problems, injury to common bile duct, liver or  intestine, bile leak and risk of general anesthesia. She seems to understand  this and agrees with the plan.      TJR/MEDQ  D:  12/09/2004  T:  12/09/2004  Job:  [id number]   cc:   Althea Grimmer. Luther Parody, M.D.  [address]  [place]  [place] [address]  Fax: [phone]   Karel Jarvis, M.D.  [facility]

## 2010-12-03 NOTE — Discharge Summary (Signed)
NAME:  Maria [name], Maria [name]                          ACCOUNT NO.:  [id number]   MEDICAL RECORD NO.:  [id number]                   PATIENT TYPE:  INP   LOCATION:  4706                                 FACILITY:  [facility]   PHYSICIAN:  Antony Madura, M.D.             DATE OF BIRTH:  10/15/1955   DATE OF ADMISSION:  05/16/2002  DATE OF DISCHARGE:  05/25/2002                                 DISCHARGE SUMMARY   FINAL DIAGNOSES:  1. Pulmonary embolus this admission.  2. Asymptomatic gallstones with increased liver function tests and increased     common bile duct.   HISTORY OF PRESENT ILLNESS:  The patient is a 55 year old white female who  was admitted because of sudden onset of chest pain and shortness of breath  with associated dizziness the day of admission.  A spiral CT scan at [facility] on  the day of admission showed pulmonary embolus to the right pulmonary artery  with associated D-dimer elevated at 0.58.  Admission to the hospital was  felt necessary for supportive care of pulmonary embolus.   HOSPITAL COURSE:  The patient was admitted to the telemetry unit where  telemetry remained normal.  The patient was immediately started on IV  heparin as well as Coumadin.  The patient was subjected to a CAT scan of the  abdomen to rule out occult carcinoma, and this showed gallstones that  appeared to be asymptomatic and also an increased common bile duct measuring  12.5 mm.  Also in the rectal area, the CAT scan was suspicious for rectal  nodules, as well as pericolonic lymph nodes.  For this reason, a consult was  placed to GI, and the patient was seen at the courtesy of Dr. Roosvelt Harps who performed upper endoscopy and colonoscopy.  Specifically, the  patient received endoscopic retrograde cholangiopancreatography with  brushings of the ampulla, and these were negative for malignancy.  Also,  colonoscopy was normal, showing no colonic pathology.  Because of  asymptomatic gallstones, the  patient was also seen at the courtesy of Dr.  Darnell Level who after reviewing the history recommended that the patient  receive a cholecystectomy and possible cholangiography only after treatment  of the pulmonary embolus, but not in the foreseeable future.  Also  recommended was a 2-D echocardiogram which was performed and this was  normal.  Also recommended was a hematology consult to workup for a  hypercoagulable state, and the patient was seen at the courtesy of  hematology who ordered multiple tests, including factor 5 leiden,  prothrombin gene mutation testing, and anti-cardiolipin antibodies.  The  preliminary data on these were normal with further outpatient followup  pending with Dr. Mancel Bale as an outpatient.  The patient also received  a venous duplex scan of the lower extremities and this was negative for deep  vein thrombophlebitis.  The remainder of the hospital course was  unremarkable, and  discharge planning was arranged.  The patient was started  on Coumadin with two day overlap of therapeutic Coumadin's with a discharge  INR being in the 1.9 to 2.2 range.  Although the etiology of the patient's  pulmonary embolus was uncertain, it was speculated that it may have been  related to the patient's outpatient estrogen therapy which was discontinued.  Possibly interacting with some outpatient herbal therapies that the patient  was taking at the time of admission.  In any case, the current  recommendation is to maintain the patient on therapeutic Coumadin for six  months following discharge without outpatient followup with Dr. Darnell Level  in the event that liver function tests become progressively elevated or if  the gallstones become symptomatic.   LABORATORY DATA:  Factor 5 leiden mutation was non-detected.  Prothrombin  gene mutation:  No mutation detected.  White blood cell count 9.6,  hemoglobin 12.2, hematocrit 36.1.  INR 1.9 on the a.m. of dictation.  SGOT  30 on  admission with increases after admission to 219. SGPT 50 on admission  with slight increase up to 105 after admission.  CA normal.  AFB tumor  marker normal.  CK-MB normal.  Urinalysis normal.   MRCP showed no gallstones in the common bile duct.  Transabdominal pelvic  ultrasound showed no evidence of pelvic or adnexal mass, normal appearing  right ovary.  A 2-D echocardiogram was normal in all respects.   DISCHARGE MEDICATIONS:  1. Coumadin 6 mg q.d.  2. Actigall 300 mg b.i.d.  3. Darvocet-N 100 one q.4h. p.r.n. pain.   DIET:  Regular.   ACTIVITY:  Sedentary around the house until seen as an outpatient.   FOLLOWUP:  In my office on Tuesday, with followup INR.   CONDITION ON DISCHARGE:  Improved.                                                 Antony Madura, M.D.    RWR/MEDQ  D:  05/25/2002  T:  05/27/2002  Job:  [id number]   cc:   Althea Grimmer. Luther Parody, M.D.  [address]  [place]  [place] [address]  Fax: [phone]

## 2011-04-12 LAB — POCT HEMOGLOBIN-HEMACUE: Hemoglobin: 15.5 — ABNORMAL HIGH

## 2012-06-04 DIAGNOSIS — D351 Benign neoplasm of parathyroid gland: Secondary | ICD-10-CM | POA: Insufficient documentation

## 2012-06-04 DIAGNOSIS — M21619 Bunion of unspecified foot: Secondary | ICD-10-CM | POA: Insufficient documentation

## 2012-06-04 DIAGNOSIS — M7750 Other enthesopathy of unspecified foot: Secondary | ICD-10-CM | POA: Insufficient documentation

## 2012-07-20 ENCOUNTER — Other Ambulatory Visit (HOSPITAL_COMMUNITY): Payer: Self-pay | Admitting: Cardiovascular Disease

## 2012-07-20 DIAGNOSIS — M79673 Pain in unspecified foot: Secondary | ICD-10-CM

## 2012-07-20 DIAGNOSIS — I739 Peripheral vascular disease, unspecified: Secondary | ICD-10-CM

## 2012-07-23 ENCOUNTER — Ambulatory Visit (HOSPITAL_COMMUNITY)
Admission: RE | Admit: 2012-07-23 | Discharge: 2012-07-23 | Disposition: A | Discharge status: Home / Self Care | Payer: BC Managed Care – PPO | Source: Ambulatory Visit | Attending: Cardiovascular Disease | Admitting: Cardiovascular Disease

## 2012-07-23 DIAGNOSIS — I739 Peripheral vascular disease, unspecified: Secondary | ICD-10-CM

## 2012-07-23 DIAGNOSIS — M79609 Pain in unspecified limb: Secondary | ICD-10-CM | POA: Insufficient documentation

## 2012-07-23 DIAGNOSIS — M79673 Pain in unspecified foot: Secondary | ICD-10-CM

## 2012-07-23 NOTE — Progress Notes (Signed)
Lower extremity arterial duplex completed 

## 2014-01-02 ENCOUNTER — Ambulatory Visit: Payer: Self-pay | Admitting: Obstetrics & Gynecology

## 2014-07-14 ENCOUNTER — Encounter: Payer: Self-pay | Admitting: *Deleted

## 2014-07-15 ENCOUNTER — Encounter: Payer: Self-pay | Admitting: Obstetrics & Gynecology

## 2015-02-06 ENCOUNTER — Encounter: Payer: Self-pay | Admitting: *Deleted

## 2015-04-15 DIAGNOSIS — Z87891 Personal history of nicotine dependence: Secondary | ICD-10-CM | POA: Insufficient documentation

## 2015-07-14 DIAGNOSIS — J42 Unspecified chronic bronchitis: Secondary | ICD-10-CM | POA: Insufficient documentation

## 2016-02-15 LAB — HM COLONOSCOPY

## 2016-06-01 LAB — LIPID PANEL
Cholesterol: 161 (ref 0–200)
HDL: 58 (ref 35–70)
LDL Cholesterol: 81

## 2016-06-01 LAB — HEMOGLOBIN A1C: Hemoglobin A1C: 5.9

## 2016-10-31 LAB — HM HEPATITIS C SCREENING LAB: HM Hepatitis Screen: NEGATIVE

## 2016-11-04 DIAGNOSIS — K5909 Other constipation: Secondary | ICD-10-CM | POA: Insufficient documentation

## 2016-11-09 ENCOUNTER — Encounter (HOSPITAL_COMMUNITY): Payer: Self-pay | Admitting: *Deleted

## 2016-11-09 ENCOUNTER — Emergency Department (HOSPITAL_COMMUNITY)
Admission: EM | Admit: 2016-11-09 | Discharge: 2016-11-10 | Disposition: A | Discharge status: Home / Self Care | Payer: BLUE CROSS/BLUE SHIELD | Attending: Emergency Medicine | Admitting: Emergency Medicine

## 2016-11-09 ENCOUNTER — Emergency Department (HOSPITAL_COMMUNITY): Payer: BLUE CROSS/BLUE SHIELD

## 2016-11-09 DIAGNOSIS — K5901 Slow transit constipation: Secondary | ICD-10-CM

## 2016-11-09 DIAGNOSIS — R140 Abdominal distension (gaseous): Secondary | ICD-10-CM | POA: Insufficient documentation

## 2016-11-09 DIAGNOSIS — F17210 Nicotine dependence, cigarettes, uncomplicated: Secondary | ICD-10-CM | POA: Diagnosis not present

## 2016-11-09 DIAGNOSIS — K5900 Constipation, unspecified: Secondary | ICD-10-CM | POA: Diagnosis present

## 2016-11-09 LAB — COMPREHENSIVE METABOLIC PANEL
ALT: 17 U/L (ref 14–54)
ANION GAP: 10 (ref 5–15)
AST: 19 U/L (ref 15–41)
Albumin: 3.9 g/dL (ref 3.5–5.0)
Alkaline Phosphatase: 68 U/L (ref 38–126)
BILIRUBIN TOTAL: 0.6 mg/dL (ref 0.3–1.2)
BUN: 10 mg/dL (ref 6–20)
CHLORIDE: 105 mmol/L (ref 101–111)
CO2: 23 mmol/L (ref 22–32)
Calcium: 9.4 mg/dL (ref 8.9–10.3)
Creatinine, Ser: 0.69 mg/dL (ref 0.44–1.00)
GFR calc non Af Amer: >60 mL/min (ref >60)
Glucose, Bld: 91 mg/dL (ref 65–99)
Potassium: 3.3 mmol/L — ABNORMAL LOW (ref 3.5–5.1)
Sodium: 138 mmol/L (ref 135–145)
TOTAL PROTEIN: 6.8 g/dL (ref 6.5–8.1)

## 2016-11-09 LAB — CBC WITH DIFFERENTIAL/PLATELET
BASOS ABS: 0 10*3/uL (ref 0.0–0.1)
Basophils Relative: 0 %
EOS PCT: 1 %
Eosinophils Absolute: 0.2 10*3/uL (ref 0.0–0.7)
HEMATOCRIT: 39.2 % (ref 36.0–46.0)
Hemoglobin: 13.1 g/dL (ref 12.0–15.0)
LYMPHS PCT: 39 %
Lymphs Abs: 4.1 10*3/uL — ABNORMAL HIGH (ref 0.7–4.0)
MCH: 30.8 pg (ref 26.0–34.0)
MCHC: 33.4 g/dL (ref 30.0–36.0)
MCV: 92 fL (ref 78.0–100.0)
MONO ABS: 0.6 10*3/uL (ref 0.1–1.0)
MONOS PCT: 6 %
Neutro Abs: 5.8 10*3/uL (ref 1.7–7.7)
Neutrophils Relative %: 54 %
PLATELETS: 304 10*3/uL (ref 150–400)
RBC: 4.26 MIL/uL (ref 3.87–5.11)
RDW: 13 % (ref 11.5–15.5)
WBC: 10.7 10*3/uL — ABNORMAL HIGH (ref 4.0–10.5)

## 2016-11-09 LAB — LIPASE, BLOOD: Lipase: 19 U/L (ref 11–51)

## 2016-11-09 MED ORDER — PEG 3350-KCL-NABCB-NACL-NASULF 236 G PO SOLR
4000.0000 mL | Freq: Once | ORAL | 0 refills | Status: AC
Start: 2016-11-09 — End: 2016-11-09

## 2016-11-09 MED ORDER — POLYETHYLENE GLYCOL 3350 17 GM/SCOOP PO POWD: ORAL | 0 refills | Status: DC

## 2016-11-09 NOTE — ED Provider Notes (Signed)
[facility] DEPT Provider Note   CSN: [id number] Arrival date & time: 11/09/16  1814     History   Chief Complaint Chief Complaint  Patient presents with  . Abdominal Pain  . Constipation    HPI Maria [name] is a 61 y.o. female.  The history is provided by the patient.  Constipation   This is a new problem. Episode onset: 5 weeks ago. The stool is described as pellet like. Pertinent negatives include no abdominal pain. There is no fiber in the patient's diet. She does not exercise regularly. There has been adequate water intake. She has tried osmotic agents and psyllium for the symptoms. The treatment provided no relief. Risk factors include obesity. Her past medical history does not include irritable bowel syndrome.    Past Medical History:  Diagnosis Date  . Atherosclerosis 1/0612014   aorta, iliacs and CFA bilaterally no greater than 0-49% - lower arterial doppler.  . Chest pain     There are no active problems to display for this patient.   Past Surgical History:  Procedure Laterality Date  . ABDOMINAL HYSTERECTOMY  1978  . NECK SURGERY  1999  . OVARIAN CYST REMOVAL  1988    OB History    No data available       Home Medications    Prior to Admission medications   Not on File    Family History Family History  Problem Relation Age of Onset  . Heart failure Mother   . Diabetes Mother   . Kidney disease Mother   . Hypertension Mother   . COPD Father   . Cancer Brother     lung  . Diabetes Daughter     Social History Social History  Substance Use Topics  . Smoking status: Current Every Day Smoker  . Smokeless tobacco: Not on file  . Alcohol use No     Allergies   Patient has no known allergies.   Review of Systems Review of Systems  Gastrointestinal: Positive for constipation. Negative for abdominal pain.  All other systems reviewed and are negative.    Physical Exam Updated Vital Signs BP 108/60   Pulse 62   Temp 98.7 F  (37.1 C) (Oral)   Resp 18   SpO2 96%   Physical Exam  Constitutional: She is oriented to person, place, and time. She appears well-developed and well-nourished. No distress.  HENT:  Head: Normocephalic.  Nose: Nose normal.  Eyes: Conjunctivae are normal.  Neck: Neck supple. No tracheal deviation present.  Cardiovascular: Normal rate, regular rhythm and normal heart sounds.   Pulmonary/Chest: Effort normal and breath sounds normal. No respiratory distress.  Abdominal: Soft. She exhibits no distension. There is no tenderness. There is no rebound and no guarding.  Neurological: She is alert and oriented to person, place, and time.  Skin: Skin is warm and dry.  Psychiatric: She has a normal mood and affect.  Vitals reviewed.    ED Treatments / Results  Labs (all labs ordered are listed, but only abnormal results are displayed) Labs Reviewed  CBC WITH DIFFERENTIAL/PLATELET - Abnormal; Notable for the following:       Result Value   WBC 10.7 (*)    Lymphs Abs 4.1 (*)    All other components within normal limits  COMPREHENSIVE METABOLIC PANEL - Abnormal; Notable for the following:    Potassium 3.3 (*)    All other components within normal limits  LIPASE, BLOOD    EKG  EKG Interpretation  None       Radiology Dg Abd 2 Views  Result Date: 11/09/2016 CLINICAL DATA:  Chronic constipation and subacute onset of generalized abdominal distention. Initial encounter. EXAM: ABDOMEN - 2 VIEW COMPARISON:  Abdominal ultrasound performed 12/09/2004 FINDINGS: The visualized bowel gas pattern is unremarkable. Scattered air and stool filled loops of colon are seen; no abnormal dilatation of small bowel loops is seen to suggest small bowel obstruction. No free intra-abdominal air is identified on the provided upright view. Clips are noted within the right upper quadrant, reflecting prior cholecystectomy. The visualized osseous structures are within normal limits; the sacroiliac joints are  unremarkable in appearance. The visualized lung bases are essentially clear. IMPRESSION: Unremarkable bowel gas pattern; no free intra-abdominal air seen. Moderate amount of stool noted in the colon. Electronically Signed   By: Garald Balding M.D.   On: 11/09/2016 22:29    Procedures Procedures (including critical care time)  Medications Ordered in ED Medications - No data to display   Initial Impression / Assessment and Plan / ED Course  I have reviewed the triage vital signs and the nursing notes.  Pertinent labs & imaging results that were available during my care of the patient were reviewed by me and considered in my medical decision making (see chart for details).     61 y.o. female presents with lack of bowel movement for 5 weeks according to her. I find this unlikely given her moderate amount of stool on plain film and lack of stool impaction in the rectal vault. She was instructed on MiraLAX cleanout and was provided GoLYTELY for home to help with her symptoms.  Final Clinical Impressions(s) / ED Diagnoses   Final diagnoses:  Abdominal distention  Slow transit constipation    New Prescriptions New Prescriptions   No medications on file     [name], MD 11/10/16 1249

## 2016-11-09 NOTE — ED Triage Notes (Signed)
Pt reports being sent here from an ucc for constipation, no bowel movement x 5 weeks. Reports no relief with multiple otc laxatives. Has nausea, no vomiting.

## 2017-01-30 DIAGNOSIS — F419 Anxiety disorder, unspecified: Secondary | ICD-10-CM | POA: Insufficient documentation

## 2017-07-18 DIAGNOSIS — J439 Emphysema, unspecified: Secondary | ICD-10-CM

## 2017-07-18 HISTORY — DX: Emphysema, unspecified: J43.9

## 2018-03-16 DIAGNOSIS — M7602 Gluteal tendinitis, left hip: Secondary | ICD-10-CM | POA: Diagnosis not present

## 2018-03-16 DIAGNOSIS — M7062 Trochanteric bursitis, left hip: Secondary | ICD-10-CM | POA: Diagnosis not present

## 2018-03-16 DIAGNOSIS — Z9049 Acquired absence of other specified parts of digestive tract: Secondary | ICD-10-CM | POA: Diagnosis not present

## 2018-03-16 DIAGNOSIS — M25552 Pain in left hip: Secondary | ICD-10-CM | POA: Diagnosis not present

## 2018-03-16 DIAGNOSIS — Z86718 Personal history of other venous thrombosis and embolism: Secondary | ICD-10-CM | POA: Diagnosis not present

## 2018-03-16 DIAGNOSIS — M1612 Unilateral primary osteoarthritis, left hip: Secondary | ICD-10-CM | POA: Diagnosis not present

## 2018-03-16 DIAGNOSIS — Z90722 Acquired absence of ovaries, bilateral: Secondary | ICD-10-CM | POA: Diagnosis not present

## 2018-03-16 DIAGNOSIS — J449 Chronic obstructive pulmonary disease, unspecified: Secondary | ICD-10-CM | POA: Diagnosis not present

## 2018-03-23 DIAGNOSIS — M25552 Pain in left hip: Secondary | ICD-10-CM | POA: Diagnosis not present

## 2018-03-27 DIAGNOSIS — M25552 Pain in left hip: Secondary | ICD-10-CM | POA: Diagnosis not present

## 2018-04-04 DIAGNOSIS — M7602 Gluteal tendinitis, left hip: Secondary | ICD-10-CM | POA: Diagnosis not present

## 2018-04-04 DIAGNOSIS — R29898 Other symptoms and signs involving the musculoskeletal system: Secondary | ICD-10-CM | POA: Diagnosis not present

## 2018-04-04 DIAGNOSIS — M7601 Gluteal tendinitis, right hip: Secondary | ICD-10-CM | POA: Diagnosis not present

## 2018-04-04 DIAGNOSIS — M25652 Stiffness of left hip, not elsewhere classified: Secondary | ICD-10-CM | POA: Diagnosis not present

## 2018-04-04 DIAGNOSIS — M25552 Pain in left hip: Secondary | ICD-10-CM | POA: Diagnosis not present

## 2018-04-04 DIAGNOSIS — R102 Pelvic and perineal pain: Secondary | ICD-10-CM | POA: Diagnosis not present

## 2018-04-11 DIAGNOSIS — M779 Enthesopathy, unspecified: Secondary | ICD-10-CM | POA: Diagnosis not present

## 2018-04-12 DIAGNOSIS — R262 Difficulty in walking, not elsewhere classified: Secondary | ICD-10-CM | POA: Diagnosis not present

## 2018-04-12 DIAGNOSIS — J449 Chronic obstructive pulmonary disease, unspecified: Secondary | ICD-10-CM | POA: Diagnosis not present

## 2018-04-12 DIAGNOSIS — Z9049 Acquired absence of other specified parts of digestive tract: Secondary | ICD-10-CM | POA: Diagnosis not present

## 2018-04-12 DIAGNOSIS — Z87891 Personal history of nicotine dependence: Secondary | ICD-10-CM | POA: Diagnosis not present

## 2018-04-12 DIAGNOSIS — Z86711 Personal history of pulmonary embolism: Secondary | ICD-10-CM | POA: Diagnosis not present

## 2018-04-12 DIAGNOSIS — F329 Major depressive disorder, single episode, unspecified: Secondary | ICD-10-CM | POA: Diagnosis not present

## 2018-04-12 DIAGNOSIS — M545 Low back pain: Secondary | ICD-10-CM | POA: Diagnosis not present

## 2018-04-12 DIAGNOSIS — M25552 Pain in left hip: Secondary | ICD-10-CM | POA: Diagnosis not present

## 2018-04-12 DIAGNOSIS — M7918 Myalgia, other site: Secondary | ICD-10-CM | POA: Insufficient documentation

## 2018-04-12 DIAGNOSIS — M6788 Other specified disorders of synovium and tendon, other site: Secondary | ICD-10-CM | POA: Diagnosis not present

## 2018-04-12 DIAGNOSIS — Z90722 Acquired absence of ovaries, bilateral: Secondary | ICD-10-CM | POA: Diagnosis not present

## 2018-04-12 DIAGNOSIS — K5909 Other constipation: Secondary | ICD-10-CM | POA: Diagnosis not present

## 2018-04-12 DIAGNOSIS — D72829 Elevated white blood cell count, unspecified: Secondary | ICD-10-CM | POA: Diagnosis not present

## 2018-04-12 DIAGNOSIS — G8929 Other chronic pain: Secondary | ICD-10-CM | POA: Diagnosis not present

## 2018-04-12 DIAGNOSIS — Z86718 Personal history of other venous thrombosis and embolism: Secondary | ICD-10-CM | POA: Diagnosis not present

## 2018-04-12 DIAGNOSIS — F419 Anxiety disorder, unspecified: Secondary | ICD-10-CM | POA: Diagnosis not present

## 2018-04-13 DIAGNOSIS — R262 Difficulty in walking, not elsewhere classified: Secondary | ICD-10-CM | POA: Diagnosis not present

## 2018-04-13 DIAGNOSIS — K5909 Other constipation: Secondary | ICD-10-CM | POA: Diagnosis not present

## 2018-04-13 DIAGNOSIS — F329 Major depressive disorder, single episode, unspecified: Secondary | ICD-10-CM | POA: Diagnosis not present

## 2018-04-13 DIAGNOSIS — Z6829 Body mass index (BMI) 29.0-29.9, adult: Secondary | ICD-10-CM | POA: Diagnosis not present

## 2018-04-13 DIAGNOSIS — M545 Low back pain: Secondary | ICD-10-CM | POA: Diagnosis not present

## 2018-04-13 DIAGNOSIS — Z86718 Personal history of other venous thrombosis and embolism: Secondary | ICD-10-CM | POA: Diagnosis not present

## 2018-04-13 DIAGNOSIS — M25552 Pain in left hip: Secondary | ICD-10-CM | POA: Diagnosis not present

## 2018-04-13 DIAGNOSIS — Z87891 Personal history of nicotine dependence: Secondary | ICD-10-CM | POA: Diagnosis not present

## 2018-04-13 DIAGNOSIS — G8929 Other chronic pain: Secondary | ICD-10-CM | POA: Diagnosis not present

## 2018-04-13 DIAGNOSIS — J449 Chronic obstructive pulmonary disease, unspecified: Secondary | ICD-10-CM | POA: Diagnosis not present

## 2018-04-13 DIAGNOSIS — M7602 Gluteal tendinitis, left hip: Secondary | ICD-10-CM | POA: Diagnosis not present

## 2018-04-13 DIAGNOSIS — Z90722 Acquired absence of ovaries, bilateral: Secondary | ICD-10-CM | POA: Diagnosis not present

## 2018-04-13 DIAGNOSIS — D72829 Elevated white blood cell count, unspecified: Secondary | ICD-10-CM | POA: Diagnosis not present

## 2018-04-13 DIAGNOSIS — Z9049 Acquired absence of other specified parts of digestive tract: Secondary | ICD-10-CM | POA: Diagnosis not present

## 2018-04-13 DIAGNOSIS — R102 Pelvic and perineal pain: Secondary | ICD-10-CM | POA: Diagnosis not present

## 2018-04-13 DIAGNOSIS — Z86711 Personal history of pulmonary embolism: Secondary | ICD-10-CM | POA: Diagnosis not present

## 2018-04-13 DIAGNOSIS — M6788 Other specified disorders of synovium and tendon, other site: Secondary | ICD-10-CM | POA: Diagnosis not present

## 2018-04-13 DIAGNOSIS — F419 Anxiety disorder, unspecified: Secondary | ICD-10-CM | POA: Diagnosis not present

## 2018-04-20 DIAGNOSIS — Z6829 Body mass index (BMI) 29.0-29.9, adult: Secondary | ICD-10-CM | POA: Diagnosis not present

## 2018-04-20 DIAGNOSIS — M7062 Trochanteric bursitis, left hip: Secondary | ICD-10-CM | POA: Diagnosis not present

## 2018-04-30 DIAGNOSIS — Z6829 Body mass index (BMI) 29.0-29.9, adult: Secondary | ICD-10-CM | POA: Diagnosis not present

## 2018-04-30 DIAGNOSIS — M7062 Trochanteric bursitis, left hip: Secondary | ICD-10-CM | POA: Diagnosis not present

## 2018-05-11 DIAGNOSIS — Z1239 Encounter for other screening for malignant neoplasm of breast: Secondary | ICD-10-CM | POA: Diagnosis not present

## 2018-05-11 DIAGNOSIS — Z006 Encounter for examination for normal comparison and control in clinical research program: Secondary | ICD-10-CM | POA: Diagnosis not present

## 2018-05-11 DIAGNOSIS — Z1231 Encounter for screening mammogram for malignant neoplasm of breast: Secondary | ICD-10-CM | POA: Diagnosis not present

## 2018-06-18 DIAGNOSIS — G8929 Other chronic pain: Secondary | ICD-10-CM | POA: Diagnosis not present

## 2018-06-18 DIAGNOSIS — I10 Essential (primary) hypertension: Secondary | ICD-10-CM | POA: Diagnosis not present

## 2018-06-18 DIAGNOSIS — M25552 Pain in left hip: Secondary | ICD-10-CM | POA: Diagnosis not present

## 2018-06-18 DIAGNOSIS — J449 Chronic obstructive pulmonary disease, unspecified: Secondary | ICD-10-CM | POA: Diagnosis not present

## 2018-07-02 DIAGNOSIS — J09X9 Influenza due to identified novel influenza A virus with other manifestations: Secondary | ICD-10-CM | POA: Diagnosis not present

## 2018-08-22 DIAGNOSIS — Z6830 Body mass index (BMI) 30.0-30.9, adult: Secondary | ICD-10-CM | POA: Diagnosis not present

## 2018-08-22 DIAGNOSIS — J111 Influenza due to unidentified influenza virus with other respiratory manifestations: Secondary | ICD-10-CM | POA: Diagnosis not present

## 2018-12-11 DIAGNOSIS — G4733 Obstructive sleep apnea (adult) (pediatric): Secondary | ICD-10-CM | POA: Diagnosis not present

## 2018-12-11 DIAGNOSIS — G479 Sleep disorder, unspecified: Secondary | ICD-10-CM | POA: Diagnosis not present

## 2018-12-24 DIAGNOSIS — R51 Headache: Secondary | ICD-10-CM | POA: Diagnosis not present

## 2018-12-24 DIAGNOSIS — H02401 Unspecified ptosis of right eyelid: Secondary | ICD-10-CM | POA: Diagnosis not present

## 2018-12-24 DIAGNOSIS — R05 Cough: Secondary | ICD-10-CM | POA: Diagnosis not present

## 2018-12-26 DIAGNOSIS — I609 Nontraumatic subarachnoid hemorrhage, unspecified: Secondary | ICD-10-CM | POA: Diagnosis not present

## 2018-12-26 DIAGNOSIS — Z6830 Body mass index (BMI) 30.0-30.9, adult: Secondary | ICD-10-CM | POA: Diagnosis not present

## 2018-12-27 DIAGNOSIS — R51 Headache: Secondary | ICD-10-CM | POA: Diagnosis not present

## 2018-12-27 DIAGNOSIS — I609 Nontraumatic subarachnoid hemorrhage, unspecified: Secondary | ICD-10-CM | POA: Diagnosis not present

## 2018-12-28 DIAGNOSIS — M5481 Occipital neuralgia: Secondary | ICD-10-CM | POA: Diagnosis not present

## 2019-01-10 DIAGNOSIS — D485 Neoplasm of uncertain behavior of skin: Secondary | ICD-10-CM | POA: Diagnosis not present

## 2019-01-24 DIAGNOSIS — D485 Neoplasm of uncertain behavior of skin: Secondary | ICD-10-CM | POA: Diagnosis not present

## 2019-01-31 DIAGNOSIS — M76892 Other specified enthesopathies of left lower limb, excluding foot: Secondary | ICD-10-CM | POA: Diagnosis not present

## 2019-01-31 DIAGNOSIS — M1612 Unilateral primary osteoarthritis, left hip: Secondary | ICD-10-CM | POA: Diagnosis not present

## 2019-02-01 DIAGNOSIS — G479 Sleep disorder, unspecified: Secondary | ICD-10-CM | POA: Diagnosis not present

## 2019-02-01 DIAGNOSIS — G4733 Obstructive sleep apnea (adult) (pediatric): Secondary | ICD-10-CM | POA: Diagnosis not present

## 2019-02-20 DIAGNOSIS — X58XXXA Exposure to other specified factors, initial encounter: Secondary | ICD-10-CM | POA: Diagnosis not present

## 2019-02-20 DIAGNOSIS — M25552 Pain in left hip: Secondary | ICD-10-CM | POA: Diagnosis not present

## 2019-02-20 DIAGNOSIS — S76012A Strain of muscle, fascia and tendon of left hip, initial encounter: Secondary | ICD-10-CM | POA: Diagnosis not present

## 2019-02-25 ENCOUNTER — Encounter: Payer: Self-pay | Admitting: Physical Therapy

## 2019-02-25 ENCOUNTER — Other Ambulatory Visit: Payer: Self-pay

## 2019-02-25 ENCOUNTER — Ambulatory Visit: Payer: BC Managed Care – PPO | Attending: Internal Medicine | Admitting: Physical Therapy

## 2019-02-25 DIAGNOSIS — M25552 Pain in left hip: Secondary | ICD-10-CM | POA: Diagnosis not present

## 2019-02-25 DIAGNOSIS — M25652 Stiffness of left hip, not elsewhere classified: Secondary | ICD-10-CM

## 2019-02-25 DIAGNOSIS — R262 Difficulty in walking, not elsewhere classified: Secondary | ICD-10-CM | POA: Insufficient documentation

## 2019-02-25 NOTE — Therapy (Signed)
[facility] [address]. [place], [place], [address] Phone: [phone]   Fax:  [phone]  Physical Therapy Evaluation  Patient Details  Name: Maria [name] MRN: [id number] Date of Birth: 11-Mar-1956 Referring Provider (PT): Chelsea Primus, MD   Encounter Date: 02/25/2019    Past Medical History:  Diagnosis Date  . Atherosclerosis 1/0612014   aorta, iliacs and CFA bilaterally no greater than 0-49% - lower arterial doppler.  . Chest pain     Past Surgical History:  Procedure Laterality Date  . ABDOMINAL HYSTERECTOMY  1978  . NECK SURGERY  1999  . OVARIAN CYST REMOVAL  1988    There were no vitals filed for this visit.       [facility] PT Assessment - 02/25/19 0001      Assessment   Onset Date/Surgical Date  12/26/18    Next MD Visit  not yet scheduled    Prior Therapy  Not for this issue      Precautions   Precautions  Fall      Balance Screen   Has the patient fallen in the past 6 months  Yes      [place] residence    Living Arrangements  Spouse/significant other    Type of [place] to enter    Entrance Stairs-Number of Steps  4; 8    Entrance Stairs-Rails  None      Prior Function   Level of Independence  Independent        SUBJECTIVE Chief complaint:  Patient reports this has happened before and felt like a severe cramp in the L hip. Patient notes that time the hip bone hurt and she received injections to address the pain. She notes this time it hurts twice as bad as it did then. She has to prop up with pillows to get comfortable to sleep. Patient notes that the injections did not work this time. The MD is ordering an MRI of the spine to rule in/out anything of a nerve origin. Pain is severely on the proximal lateral aspect of the thigh with some radiating pain to the inguinal region. Patient notes that she has had urgency incontinence, but no  changes to bowel/bladder otherwise. Patient notes night sweats. Patient also reports decreased appetite. Patient notes weight gain of 20 lbs over the past few months. Patient notes increased cramping in L lower leg. Referring Dx: chronic left hip pain MD: Adonis Housekeeper MD Pain: 10/10 Present, 10/10 Best, 10-11/10 Worst: Aggravating factors: weight bearing, pivoting on painful leg,  Easing factors: ice, pain meds,  24 hour pain behavior: patient notes it stays the same throughout How long can you sit: 0 min How long can you stand: 0 min How long can you walk: 0 min Recent back/hip trauma: No Prior history of L hip injury or pain: No Pain quality: pain quality: sharp Radiating pain: Yes  Numbness/Tingling: No Follow-up appointment with MD: No Dominant hand: right Imaging: Yes ; positive for glute min tear and mild osseous changes; lumbar spine MRI ordered    OBJECTIVE  Mental Status Patient is oriented to person, place and time.  Recent memory is intact.  Remote memory is intact.  Attention span and concentration are intact.  Expressive speech is intact.  Patient's fund of knowledge is within normal limits for educational level.  SENSATION: Grossly intact to light touch bilateral LEs as determined  by testing dermatomes L2-S2 Proprioception and hot/cold testing deferred on this date   MUSCULOSKELETAL: Tremor: None Bulk: Normal Tone: Normal  Posture R lateral lean with L hip in 0 degrees of flexion  Gait Quad cane, step-to gait, R lateral lean, limited weight bearing on LLE   Palpation Patient has increased pain with light touch to the lateral L hip   Strength (out of 5): unable to formally assess LLE due to 11/10 pain R 5 Hip flexion 4 Hip ER 4 Hip IR 4 Hip abduction 4 Hip adduction 4 Hip extension 4 Knee extension 4 Knee flexion 4/Ankle dorsiflexion *Indicates pain   AROM (degrees): unable to formally assess LLE due to 11/10 pain R/L (all movements  include overpressure unless otherwise stated) Lumbar forward flexion (0-65):  Lumbar extension (0-30): Lumbar lateral flexion (0-25): R:  L:  Lumbar rotation: Hip IR (0-45): R: L: Hip ER (0-45): R: L: Hip Flexion (0-125):  Hip Abduction (0-40): R: L: Hip extension (0-15): R: L: *Indicates pain  Joint Mobility Long axis L hip distraction, grade I, not tolerated well   ASSESSMENT Patient is a 63 year old presenting to clinic with chief complaints of L hip pain of high intensity and decreased function. Upon examination, patient demonstrates deficits in pain, gait, ROM, and strength as evidenced by decreased tolerance to weight bearing on LLE, inability to actively flex L hip, and step-to gait pattern with quad cane with antalgic pattern. Patient's responses on FOTO (LEFS) outcome measures (18) indicate severe functional limitations/disability/distress. Patient's progress may be limited due to high pain intensity and unknown origin of pain; however, patient's motivation is advantageous. Patient was able to achieve diaphragmatic breathing pain coping skill during today's evaluation and responded positively to educational interventions. At this time, further physical therapy will be dependent on the results of her lumbar MRI to rule out anything more significant. Patient will benefit from continued skilled therapeutic intervention to address deficits in pain, gait, LLE ROM, and LLE strength in order to increase function, and improve overall QOL.  TREATMENT  Neuromuscular Re-education: Pain coping skills training:   Diaphragmatic breathing for nervous system downtraining  Guided Imagery meditation  Patient educated on prognosis, POC, and provided with HEP including: diaphragmatic breathing and guided imagery. Patient articulated understanding and returned demonstration. Patient will benefit from further education in order to maximize compliance and understanding for long-term therapeutic gains.     Objective measurements completed on examination: See above findings.     PT Long Term Goals - 02/27/19 1250      PT LONG TERM GOAL #1   Title  Pt will be independent with HEP in order to improve strength and decrease back pain in order to improve pain-free function at home and work.    Baseline  IE: provided    Time  8    Period  Weeks    Status  New    Target Date  04/24/19      PT LONG TERM GOAL #2   Title  Pt will improve FOTO (LEFS) score to 43 in order to indicate a clinically significant improvement in function.    Baseline  IE: 18    Time  8    Period  Weeks    Status  New    Target Date  04/24/19      PT LONG TERM GOAL #3   Title  Pt will decrease worst hip pain as reported on NPRS by at least 2 points in order to demonstrate  clinically significant reduction in hip pain.    Baseline  IE: 11/10    Time  8    Period  Weeks    Status  New    Target Date  04/24/19      PT LONG TERM GOAL #4   Title  Patient will demonstrate step-through gait pattern during ambulation with symmetrical weight bearing on BLE with LRAD for distances > 500 feet in order to return to PLOF.    Baseline  IE: quad cane, <50% WB on LLE, step-to gait, <100 feet    Time  8    Period  Weeks    Status  New    Target Date  04/24/19       Plan - 02/27/19 1243    Clinical Impression Statement  Patient is a 63 year old presenting to clinic with chief complaints of L hip pain of high intensity and decreased function. Upon examination, patient demonstrates deficits in pain, gait, ROM, and strength as evidenced by decreased tolerance to weight bearing on LLE, inability to actively flex L hip, and step-to gait pattern with quad cane with antalgic pattern. Patient's responses on FOTO (LEFS) outcome measures (18) indicate severe functional limitations/disability/distress. Patient's progress may be limited due to high pain intensity and unknown origin of pain; however, patient's motivation is advantageous.  Patient was able to achieve diaphragmatic breathing pain coping skill during today's evaluation and responded positively to educational interventions. At this time, further physical therapy will be dependent on the results of her lumbar MRI to rule out anything more significant. Patient will benefit from continued skilled therapeutic intervention to address deficits in pain, gait, LLE ROM, and LLE strength in order to increase function, and improve overall QOL.    Personal Factors and Comorbidities  Fitness;Behavior Pattern;Past/Current Experience;Profession;Sex;Time since onset of injury/illness/exacerbation    Examination-Activity Limitations  Bathing;Bed Mobility;Bend;Sit;Sleep;Squat;Stairs;Stand;Lift    Examination-Participation Restrictions  Meal Prep;Cleaning;Community Activity;Yard Work;Laundry;Interpersonal Relationship;Driving;Shop    Stability/Clinical Decision Making  Evolving/Moderate complexity    Clinical Decision Making  Moderate    Rehab Potential  Fair    PT Frequency  2x / week    PT Duration  8 weeks    PT Treatment/Interventions  ADLs/Self Care Home Management;Cryotherapy;Moist Heat;Functional mobility training;Therapeutic activities;Therapeutic exercise;Balance training;Gait training;Stair training;Neuromuscular re-education;Patient/family education;Dry needling;Passive range of motion;Manual techniques;Taping;Joint Manipulations    PT Next Visit Plan  pain coping skills training    PT Home Exercise Plan  Diaphragmatic breathing    Consulted and Agree with Plan of Care  Patient          Patient will benefit from skilled therapeutic intervention in order to improve the following deficits and impairments:  Abnormal gait, Decreased range of motion, Difficulty walking, Pain, Decreased activity tolerance, Decreased balance, Hypomobility, Improper body mechanics, Decreased strength, Decreased mobility  Visit Diagnosis: 1. Pain in left hip   2. Difficulty in walking, not  elsewhere classified   3. Decreased range of left hip movement        Problem List There are no active problems to display for this patient.  Myles Gip PT, DPT [phone] 02/25/2019, 3:52 PM  [facility] [address] [place], [place], [address] Phone: [phone]   Fax:  [phone]  Name: FAYE STROHMAN MRN: [id number] Date of Birth: 01-08-1956

## 2019-02-26 ENCOUNTER — Emergency Department (HOSPITAL_COMMUNITY): Payer: BC Managed Care – PPO

## 2019-02-26 ENCOUNTER — Encounter (HOSPITAL_COMMUNITY): Payer: Self-pay | Admitting: Emergency Medicine

## 2019-02-26 ENCOUNTER — Other Ambulatory Visit: Payer: Self-pay

## 2019-02-26 ENCOUNTER — Inpatient Hospital Stay (HOSPITAL_COMMUNITY)
Admission: EM | Admit: 2019-02-26 | Discharge: 2019-03-03 | DRG: 177 | Disposition: A | Discharge status: Home Health | Payer: BC Managed Care – PPO | Attending: Internal Medicine | Admitting: Internal Medicine

## 2019-02-26 DIAGNOSIS — Z86718 Personal history of other venous thrombosis and embolism: Secondary | ICD-10-CM | POA: Diagnosis not present

## 2019-02-26 DIAGNOSIS — I4510 Unspecified right bundle-branch block: Secondary | ICD-10-CM | POA: Diagnosis present

## 2019-02-26 DIAGNOSIS — A09 Infectious gastroenteritis and colitis, unspecified: Secondary | ICD-10-CM

## 2019-02-26 DIAGNOSIS — R110 Nausea: Secondary | ICD-10-CM | POA: Diagnosis not present

## 2019-02-26 DIAGNOSIS — I951 Orthostatic hypotension: Secondary | ICD-10-CM | POA: Diagnosis not present

## 2019-02-26 DIAGNOSIS — J9811 Atelectasis: Secondary | ICD-10-CM | POA: Diagnosis present

## 2019-02-26 DIAGNOSIS — R4020 Unspecified coma: Secondary | ICD-10-CM | POA: Diagnosis not present

## 2019-02-26 DIAGNOSIS — G8929 Other chronic pain: Secondary | ICD-10-CM | POA: Diagnosis present

## 2019-02-26 DIAGNOSIS — Z801 Family history of malignant neoplasm of trachea, bronchus and lung: Secondary | ICD-10-CM | POA: Diagnosis not present

## 2019-02-26 DIAGNOSIS — Z841 Family history of disorders of kidney and ureter: Secondary | ICD-10-CM

## 2019-02-26 DIAGNOSIS — E785 Hyperlipidemia, unspecified: Secondary | ICD-10-CM | POA: Diagnosis present

## 2019-02-26 DIAGNOSIS — J1282 Pneumonia due to coronavirus disease 2019: Secondary | ICD-10-CM | POA: Diagnosis present

## 2019-02-26 DIAGNOSIS — M25652 Stiffness of left hip, not elsewhere classified: Secondary | ICD-10-CM | POA: Diagnosis not present

## 2019-02-26 DIAGNOSIS — Z825 Family history of asthma and other chronic lower respiratory diseases: Secondary | ICD-10-CM

## 2019-02-26 DIAGNOSIS — Z86711 Personal history of pulmonary embolism: Secondary | ICD-10-CM | POA: Diagnosis not present

## 2019-02-26 DIAGNOSIS — Z90710 Acquired absence of both cervix and uterus: Secondary | ICD-10-CM

## 2019-02-26 DIAGNOSIS — J42 Unspecified chronic bronchitis: Secondary | ICD-10-CM

## 2019-02-26 DIAGNOSIS — Z8542 Personal history of malignant neoplasm of other parts of uterus: Secondary | ICD-10-CM | POA: Diagnosis not present

## 2019-02-26 DIAGNOSIS — Z79899 Other long term (current) drug therapy: Secondary | ICD-10-CM

## 2019-02-26 DIAGNOSIS — Z833 Family history of diabetes mellitus: Secondary | ICD-10-CM | POA: Diagnosis not present

## 2019-02-26 DIAGNOSIS — J449 Chronic obstructive pulmonary disease, unspecified: Secondary | ICD-10-CM

## 2019-02-26 DIAGNOSIS — R55 Syncope and collapse: Secondary | ICD-10-CM | POA: Diagnosis not present

## 2019-02-26 DIAGNOSIS — J9601 Acute respiratory failure with hypoxia: Secondary | ICD-10-CM | POA: Diagnosis present

## 2019-02-26 DIAGNOSIS — R0902 Hypoxemia: Secondary | ICD-10-CM | POA: Diagnosis not present

## 2019-02-26 DIAGNOSIS — M25552 Pain in left hip: Secondary | ICD-10-CM | POA: Diagnosis not present

## 2019-02-26 DIAGNOSIS — E860 Dehydration: Secondary | ICD-10-CM | POA: Diagnosis present

## 2019-02-26 DIAGNOSIS — U071 COVID-19: Principal | ICD-10-CM | POA: Diagnosis present

## 2019-02-26 DIAGNOSIS — R262 Difficulty in walking, not elsewhere classified: Secondary | ICD-10-CM | POA: Diagnosis not present

## 2019-02-26 DIAGNOSIS — I2510 Atherosclerotic heart disease of native coronary artery without angina pectoris: Secondary | ICD-10-CM | POA: Diagnosis present

## 2019-02-26 DIAGNOSIS — J1289 Other viral pneumonia: Secondary | ICD-10-CM | POA: Diagnosis present

## 2019-02-26 DIAGNOSIS — J440 Chronic obstructive pulmonary disease with acute lower respiratory infection: Secondary | ICD-10-CM | POA: Diagnosis present

## 2019-02-26 DIAGNOSIS — Z8249 Family history of ischemic heart disease and other diseases of the circulatory system: Secondary | ICD-10-CM

## 2019-02-26 DIAGNOSIS — F17210 Nicotine dependence, cigarettes, uncomplicated: Secondary | ICD-10-CM | POA: Diagnosis present

## 2019-02-26 DIAGNOSIS — I10 Essential (primary) hypertension: Secondary | ICD-10-CM | POA: Diagnosis not present

## 2019-02-26 HISTORY — DX: Chronic obstructive pulmonary disease, unspecified: J44.9

## 2019-02-26 LAB — BASIC METABOLIC PANEL
Anion gap: 13 (ref 5–15)
BUN: 15 mg/dL (ref 8–23)
CO2: 20 mmol/L — ABNORMAL LOW (ref 22–32)
Calcium: 8.7 mg/dL — ABNORMAL LOW (ref 8.9–10.3)
Chloride: 102 mmol/L (ref 98–111)
Creatinine, Ser: 0.75 mg/dL (ref 0.44–1.00)
GFR calc Af Amer: >60 mL/min (ref >60)
GFR calc non Af Amer: >60 mL/min (ref >60)
Glucose, Bld: 92 mg/dL (ref 70–99)
Potassium: 3.2 mmol/L — ABNORMAL LOW (ref 3.5–5.1)
Sodium: 135 mmol/L (ref 135–145)

## 2019-02-26 LAB — SARS CORONAVIRUS 2 BY RT PCR (HOSPITAL ORDER, PERFORMED IN ~~LOC~~ HOSPITAL LAB): SARS Coronavirus 2: POSITIVE — AB

## 2019-02-26 LAB — TROPONIN I (HIGH SENSITIVITY): Troponin I (High Sensitivity): 7 ng/L (ref <18)

## 2019-02-26 LAB — CBC
HCT: 44.6 % (ref 36.0–46.0)
Hemoglobin: 14.8 g/dL (ref 12.0–15.0)
MCH: 31 pg (ref 26.0–34.0)
MCHC: 33.2 g/dL (ref 30.0–36.0)
MCV: 93.3 fL (ref 80.0–100.0)
Platelets: 255 10*3/uL (ref 150–400)
RBC: 4.78 MIL/uL (ref 3.87–5.11)
RDW: 12.7 % (ref 11.5–15.5)
WBC: 3.9 10*3/uL — ABNORMAL LOW (ref 4.0–10.5)
nRBC: 0 % (ref 0.0–0.2)

## 2019-02-26 LAB — CBG MONITORING, ED: Glucose-Capillary: 93 mg/dL (ref 70–99)

## 2019-02-26 MED ORDER — SENNA 8.6 MG PO TABS
1.0000 | ORAL_TABLET | Freq: Two times a day (BID) | ORAL | Status: DC
  Administered 2019-02-27 – 2019-03-01 (×5): 8.6 mg via ORAL
  Filled 2019-02-26 (×9): qty 1

## 2019-02-26 MED ORDER — FLUTICASONE-UMECLIDIN-VILANT 100-62.5-25 MCG/INH IN AEPB: 1.0000 | INHALATION_SPRAY | Freq: Every day | RESPIRATORY_TRACT | Status: DC

## 2019-02-26 MED ORDER — AEROCHAMBER PLUS FLO-VU LARGE MISC
1.0000 | Freq: Once | Status: AC
  Administered 2019-02-27: 1
  Filled 2019-02-26: qty 1

## 2019-02-26 MED ORDER — VENLAFAXINE HCL ER 75 MG PO CP24
75.0000 mg | ORAL_CAPSULE | Freq: Every day | ORAL | Status: DC
  Administered 2019-02-26 – 2019-03-03 (×6): 75 mg via ORAL
  Filled 2019-02-26 (×7): qty 1

## 2019-02-26 MED ORDER — FLUTICASONE FUROATE-VILANTEROL 100-25 MCG/INH IN AEPB
1.0000 | INHALATION_SPRAY | Freq: Every day | RESPIRATORY_TRACT | Status: DC
  Administered 2019-02-27 – 2019-03-03 (×5): 1 via RESPIRATORY_TRACT
  Filled 2019-02-26: qty 28

## 2019-02-26 MED ORDER — AMLODIPINE BESYLATE 5 MG PO TABS
10.0000 mg | ORAL_TABLET | Freq: Every day | ORAL | Status: DC
  Administered 2019-02-27: 10 mg via ORAL
  Filled 2019-02-26: qty 2

## 2019-02-26 MED ORDER — UMECLIDINIUM BROMIDE 62.5 MCG/INH IN AEPB
1.0000 | INHALATION_SPRAY | Freq: Every day | RESPIRATORY_TRACT | Status: DC
  Administered 2019-02-27 – 2019-03-03 (×5): 1 via RESPIRATORY_TRACT
  Filled 2019-02-26: qty 7

## 2019-02-26 MED ORDER — SODIUM CHLORIDE 0.9 % IV BOLUS
1000.0000 mL | Freq: Once | INTRAVENOUS | Status: AC
  Administered 2019-02-26: 1000 mL via INTRAVENOUS

## 2019-02-26 MED ORDER — TRAMADOL HCL 50 MG PO TABS
50.0000 mg | ORAL_TABLET | Freq: Four times a day (QID) | ORAL | Status: DC | PRN
  Administered 2019-03-01: 50 mg via ORAL
  Filled 2019-02-26: qty 1

## 2019-02-26 MED ORDER — NORTRIPTYLINE HCL 25 MG PO CAPS
25.0000 mg | ORAL_CAPSULE | Freq: Every day | ORAL | Status: DC
  Administered 2019-02-26 – 2019-03-02 (×5): 25 mg via ORAL
  Filled 2019-02-26 (×6): qty 1

## 2019-02-26 MED ORDER — SODIUM CHLORIDE 0.9% FLUSH: 3.0000 mL | Freq: Once | INTRAVENOUS | Status: DC

## 2019-02-26 MED ORDER — SODIUM CHLORIDE 0.9 % IV BOLUS
1000.0000 mL | Freq: Once | INTRAVENOUS | Status: AC
  Administered 2019-02-26: 15:00:00 1000 mL via INTRAVENOUS

## 2019-02-26 MED ORDER — ACETAMINOPHEN 500 MG PO TABS
1000.0000 mg | ORAL_TABLET | Freq: Once | ORAL | Status: AC
  Administered 2019-02-26: 1000 mg via ORAL
  Filled 2019-02-26: qty 2

## 2019-02-26 MED ORDER — PREGABALIN 25 MG PO CAPS
50.0000 mg | ORAL_CAPSULE | Freq: Three times a day (TID) | ORAL | Status: DC
  Administered 2019-02-26 – 2019-03-03 (×14): 50 mg via ORAL
  Filled 2019-02-26 (×14): qty 2

## 2019-02-26 MED ORDER — DEXTROSE-NACL 5-0.45 % IV SOLN
INTRAVENOUS | Status: DC
  Administered 2019-02-27: 02:00:00 via INTRAVENOUS

## 2019-02-26 MED ORDER — ACETAMINOPHEN 325 MG PO TABS
650.0000 mg | ORAL_TABLET | Freq: Four times a day (QID) | ORAL | Status: DC | PRN
  Administered 2019-02-28 – 2019-03-01 (×2): 650 mg via ORAL
  Filled 2019-02-26 (×2): qty 2

## 2019-02-26 MED ORDER — ALBUTEROL SULFATE HFA 108 (90 BASE) MCG/ACT IN AERS
2.0000 | INHALATION_SPRAY | Freq: Four times a day (QID) | RESPIRATORY_TRACT | Status: DC | PRN
  Administered 2019-02-27 – 2019-03-03 (×4): 2 via RESPIRATORY_TRACT
  Filled 2019-02-26: qty 6.7

## 2019-02-26 MED ORDER — ONDANSETRON 4 MG PO TBDP
4.0000 mg | ORAL_TABLET | Freq: Once | ORAL | Status: AC
  Administered 2019-02-26: 4 mg via ORAL
  Filled 2019-02-26: qty 1

## 2019-02-26 MED ORDER — ENOXAPARIN SODIUM 40 MG/0.4ML ~~LOC~~ SOLN
40.0000 mg | SUBCUTANEOUS | Status: DC
  Administered 2019-02-27 – 2019-03-03 (×5): 40 mg via SUBCUTANEOUS
  Filled 2019-02-26 (×5): qty 0.4

## 2019-02-26 NOTE — ED Notes (Signed)
Pt unable to ambulate in the room due to feeling too weak and dizzy, pt orthostatic upon assessment. EDP made aware, more fluids ordered

## 2019-02-26 NOTE — ED Notes (Signed)
[facility] Call

## 2019-02-26 NOTE — ED Provider Notes (Signed)
[facility] EMERGENCY DEPARTMENT Provider Note   CSN: [id number] Arrival date & time: 02/26/19  1300    History   Chief Complaint Chief Complaint  Patient presents with   Loss of Consciousness   Chest Pain    HPI Maria [name] is a 63 y.o. female.     HPI  Patient presents from home for evaluation of nausea, diarrhea, chest heaviness/shortness of breath, and 2 episodes of syncope.  She reports that she had been well until yesterday morning when she had persistent nausea.  Multiple episodes of diarrhea yesterday.  Felt general malaise.  Earlier this morning she woke up to go to the bathroom.  Her husband observed her and provide a history that she was on the toilet, got up and felt dizzy, sat back down.  He turned to do something and turned back around to see that she had stood up and started to walk, and fell to the floor.  Had around 10 seconds of syncope.  No seizure activity.  Awoke at baseline.  Back to sleep for several hours and had a very similar episode later in the morning which went to the bathroom, had large-volume diarrhea.  Felt too weak to rise from the toilet.  He attempted to help her and went to get something.  Return and she had ambulated into the bedroom and syncopized onto their bed for around a minute.  Awoke at baseline again like previous.  On my exam the patient endorses chest heaviness, shortness of breath, and full body weakness/fatigue.   Past Medical History:  Diagnosis Date   Atherosclerosis 1/0612014   aorta, iliacs and CFA bilaterally no greater than 0-49% - lower arterial doppler.   Chest pain    COPD (chronic obstructive pulmonary disease) ([place])     There are no active problems to display for this patient.   Past Surgical History:  Procedure Laterality Date   ABDOMINAL HYSTERECTOMY  [place]   OVARIAN CYST REMOVAL  1988     OB History   No obstetric history on file.      Home Medications     Prior to Admission medications   Medication Sig Start Date End Date Taking? Authorizing Provider  amLODipine (NORVASC) 5 MG tablet Take 10 mg by mouth daily. 09/17/18 09/17/19 Yes [provider]  celecoxib (CELEBREX) 200 MG capsule Take 200 mg by mouth daily. 02/21/19  Yes [provider]  diclofenac sodium (VOLTAREN) 1 % GEL Apply 2 g topically 4 (four) times daily as needed (pain).  11/26/18 11/26/19 Yes [provider]  Fluticasone-Umeclidin-Vilant (TRELEGY ELLIPTA) 100-62.5-25 MCG/INH AEPB Inhale 1 puff into the lungs daily. 01/04/19  Yes [provider]  nortriptyline (PAMELOR) 25 MG capsule Take 25 mg by mouth at bedtime. 12/28/18 12/28/19 Yes [provider]  pregabalin (LYRICA) 50 MG capsule Take 50 mg by mouth 3 (three) times daily.  12/03/18  Yes [provider]  venlafaxine XR (EFFEXOR-XR) 75 MG 24 hr capsule Take 75 mg by mouth daily. 02/21/19  Yes [provider]  VENTOLIN HFA 108 (90 Base) MCG/ACT inhaler Inhale 2 puffs into the lungs every 6 (six) hours as needed for shortness of breath. 02/23/19  Yes [provider]    Family History Family History  Problem Relation Age of Onset   Heart failure Mother    Diabetes Mother    Kidney disease Mother    Hypertension Mother    COPD Father  Cancer Brother        lung   Diabetes Daughter     Social History Social History   Tobacco Use   Smoking status: Current Every Day Smoker   Smokeless tobacco: Never Used  Substance Use Topics   Alcohol use: No    Alcohol/week: 0.0 standard drinks   Drug use: No     Allergies   Patient has no known allergies.   Review of Systems Review of Systems  Constitutional: Negative for fever.  HENT: Negative for ear pain and sore throat.   Eyes: Negative for pain and visual disturbance.  Respiratory: Positive for chest tightness and shortness of breath. Negative for cough.   Cardiovascular: Negative for chest  pain and palpitations.  Gastrointestinal: Positive for diarrhea and nausea. Negative for abdominal pain and vomiting.  Genitourinary: Negative for dysuria and hematuria.  Musculoskeletal: Negative for arthralgias and back pain.  Skin: Negative for color change and rash.  Neurological: Positive for syncope and light-headedness. Negative for seizures.  Hematological: Negative for adenopathy.  All other systems reviewed and are negative.    Physical Exam Updated Vital Signs BP (!) 105/55    Pulse 63    Temp 99.9 F (37.7 C) (Oral)    Resp (!) 21    Ht 5\' 6"  (1.676 m)    Wt 82.6 kg    SpO2 97%    BMI 29.38 kg/m   Physical Exam Vitals signs and nursing note reviewed.  Constitutional:      General: She is not in acute distress.    Appearance: She is well-developed. She is ill-appearing. She is not toxic-appearing.  HENT:     Head: Normocephalic and atraumatic.  Eyes:     Conjunctiva/sclera: Conjunctivae normal.  Neck:     Musculoskeletal: Neck supple.  Cardiovascular:     Rate and Rhythm: Normal rate and regular rhythm.     Heart sounds: No murmur.  Pulmonary:     Effort: Pulmonary effort is normal. No respiratory distress.     Comments: Clear equal bilaterally sounds in the upper lobes.  In bilateral lower lobes crackles with diminished air movement. Abdominal:     Palpations: Abdomen is soft.     Tenderness: There is no abdominal tenderness.  Musculoskeletal:     Right lower leg: No edema.     Left lower leg: No edema.  Skin:    General: Skin is warm and dry.  Neurological:     Mental Status: She is alert and oriented to person, place, and time.      ED Treatments / Results  Labs (all labs ordered are listed, but only abnormal results are displayed) Labs Reviewed  SARS CORONAVIRUS 2 ([facility]) - Abnormal; Notable for the following components:      Result Value   SARS Coronavirus 2 POSITIVE (*)    All other components  within normal limits  BASIC METABOLIC PANEL - Abnormal; Notable for the following components:   Potassium 3.2 (*)    CO2 20 (*)    Calcium 8.7 (*)    All other components within normal limits  CBC - Abnormal; Notable for the following components:   WBC 3.9 (*)    All other components within normal limits  CBG MONITORING, ED  TROPONIN I (HIGH SENSITIVITY)  TROPONIN I (HIGH SENSITIVITY)    EKG EKG Interpretation  Date/Time:  Tuesday February 26 2019 12:59:30 EDT Ventricular Rate:  74 PR Interval:  132 QRS  Duration: 92 QT Interval:  414 QTC Calculation: 459 R Axis:   57 Text Interpretation:  Normal sinus rhythm Low voltage QRS Incomplete right bundle branch block Septal infarct , age undetermined Abnormal ECG similar to Mar 2010 Confirmed by Sherwood Gambler [phone]) on 02/26/2019 2:10:21 PM   Radiology Dg Chest Portable 1 View  Result Date: 02/26/2019 CLINICAL DATA:  Weakness with syncope EXAM: PORTABLE CHEST 1 VIEW COMPARISON:  October 02, 2008 FINDINGS: There is slight bibasilar atelectasis. There is no edema or consolidation. Heart size and pulmonary vascularity are normal. No adenopathy. There is aortic atherosclerosis. There is postoperative change in the lower cervical region. IMPRESSION: Bibasilar atelectasis. No edema or consolidation. Heart size within normal limits. Aortic Atherosclerosis (ICD10-I70.0). Electronically Signed   By: Lowella Grip III M.D.   On: 02/26/2019 15:35    Procedures Procedures (including critical care time)  Medications Ordered in ED Medications  sodium chloride flush (NS) 0.9 % injection 3 mL (0 mLs Intravenous Hold 02/26/19 1402)  ondansetron (ZOFRAN-ODT) disintegrating tablet 4 mg (4 mg Oral Given 02/26/19 1457)  acetaminophen (TYLENOL) tablet 1,000 mg (1,000 mg Oral Given 02/26/19 1457)  sodium chloride 0.9 % bolus 1,000 mL (0 mLs Intravenous Stopped 02/26/19 1639)  sodium chloride 0.9 % bolus 1,000 mL (1,000 mLs Intravenous New Bag/Given 02/26/19  1700)     Initial Impression / Assessment and Plan / ED Course  I have reviewed the triage vital signs and the nursing notes.  Pertinent labs & imaging results that were available during my care of the patient were reviewed by me and considered in my medical decision making (see chart for details).        Ms. Mcgonagle is a 63 year old female with a history of COPD, constipation, anxiety, depressive disorder, history of DVT not on anticoagulation, OSA, occipital neuralgia, hypertension, chronic left hip pain.  On amlodipine for hypertension.  I reviewed her medical records.  Here with primary complaint of weakness and syncope.  Findings are ultimately notable for mild hypokalemia at 3.2, slightly low bicarbonate 20.  I suspect these are from dehydration.  She has mild leukopenia at 3.9 with no anemia.  She was positive for coronavirus.  Chest x-ray shows bibasilar atelectasis.  She was satting around 96% on 2 L of oxygen.  On room air satting around 90 to 91%.  She does not have significantly increased work of breathing.  She exhibits orthostasis and had difficulty tolerating standing blood pressure, having to sit before reading is complete.  Nursing staff made multiple attempts to ambulate her but she continues to complain of weakness and lightheadedness.  This seems more consistent with orthostasis then respiratory etiology, but either way she appears to be the most appropriate for admission for continued treatment and observation.  Discussed with hospitalist service who is in agreement.  Patient was subsequently admitted to the hospitalist service.  Final Clinical Impressions(s) / ED Diagnoses   Final diagnoses:  Syncope and collapse  COVID-19 virus infection  Diarrhea of infectious origin  Orthostatic hypotension  Chronic bronchitis, unspecified chronic bronchitis type [facility])    ED Discharge Orders    None       Tillie Fantasia, MD 02/26/19 Erskin Burnet    Sherwood Gambler, MD 02/27/19  [phone]

## 2019-02-26 NOTE — ED Triage Notes (Signed)
Patient presents to the ED by EMS with c/o weakness with syncope today witnessed by husband. She is a/o x4 at triage and reports 2-3 days of diarrhea along with chest tightness. Zofran 4mg  PTA. Denies blood thinners.

## 2019-02-26 NOTE — ED Notes (Signed)
Pt given water, offered food but pt refused

## 2019-02-26 NOTE — H&P (Signed)
History and Physical    Maria [name]:[id number] DOB: 1955-07-21 DOA: 02/26/2019  PCP: Chelsea Primus, MD (Confirm with patient/family/NH records and if not entered, this has to be entered at [facility] point of entry) Patient coming from: Coming from home  I have personally briefly reviewed patient's old medical records in [place]  Chief Complaint: Syncope x2  HPI: Maria [name] is a 63 y.o. female with medical history significant for COPD atherosclerosis.  Reports a several day history of increasing weakness, mild shortness of breath worse than her usual, nausea and multiple loose stools.  Admits that she has had poor p.o. intake for several days.  He does not report having any high fevers.  Because of her persistent episodes of lightheadedness and 2 episodes of syncope he presents for evaluation to the [facility] emergency department.    ED Course: Patient is hemodynamically stable in the emergency department.  She was COVID tested and was positive.  Chest x-ray revealed bibasilar atelectasis but was otherwise unremarkable.  She was monitored on telemetry with no arrhythmias noted.  Patient was orthostatic when she was moved from a supine to standing position.  She is referred for admission for symptoms of COVID-19 including significant dehydration.  Review of Systems: As per HPI otherwise 10 point review of systems negative.    Past Medical History:  Diagnosis Date   Atherosclerosis [id number]   aorta, iliacs and CFA bilaterally no greater than 0-49% - lower arterial doppler.   Chest pain    COPD (chronic obstructive pulmonary disease) (HCC)   History of DVT-PE 12 years ago with hemorrhage secondary to anticoagulation.  Required tracheostomy  Past Surgical History:  Procedure Laterality Date   ABDOMINAL HYSTERECTOMY  [place]   OVARIAN CYST REMOVAL  1988     reports that she has been smoking. She has never used smokeless tobacco. She reports that she does  not drink alcohol or use drugs.  No Known Allergies  Family History  Problem Relation Age of Onset   Heart failure Mother    Diabetes Mother    Kidney disease Mother    Hypertension Mother    COPD Father    Cancer Brother        lung   Diabetes Daughter    Social history -patient is married.  4 children, 2 boys 2 girls.  4 grandchildren.  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  She lives with her husband.  Prior to Admission medications   Medication Sig Start Date End Date Taking? Authorizing Provider  amLODipine (NORVASC) 5 MG tablet Take 10 mg by mouth daily. 09/17/18 09/17/19 Yes [provider]  celecoxib (CELEBREX) 200 MG capsule Take 200 mg by mouth daily. 02/21/19  Yes [provider]  diclofenac sodium (VOLTAREN) 1 % GEL Apply 2 g topically 4 (four) times daily as needed (pain).  11/26/18 11/26/19 Yes [provider]  Fluticasone-Umeclidin-Vilant (TRELEGY ELLIPTA) 100-62.5-25 MCG/INH AEPB Inhale 1 puff into the lungs daily. 01/04/19  Yes [provider]  nortriptyline (PAMELOR) 25 MG capsule Take 25 mg by mouth at bedtime. 12/28/18 12/28/19 Yes [provider]  pregabalin (LYRICA) 50 MG capsule Take 50 mg by mouth 3 (three) times daily.  12/03/18  Yes [provider]  venlafaxine XR (EFFEXOR-XR) 75 MG 24 hr capsule Take 75 mg by mouth daily. 02/21/19  Yes [provider]  VENTOLIN HFA 108 (90 Base) MCG/ACT inhaler Inhale 2 puffs  into the lungs every 6 (six) hours as needed for shortness of breath. 02/23/19  Yes [provider]    Physical Exam: Vitals:   02/26/19 1930 02/26/19 2000 02/26/19 2030 02/26/19 2100  BP: 110/61 (!) 104/58 (!) 108/59 (!) 112/59  Pulse: (!) 57 (!) 55 (!) 55 (!) 57  Resp: 18 20 18 19   Temp:      TempSrc:      SpO2: 99% 98% 98% 97%  Weight:      Height:        Constitutional: NAD, calm, comfortable Vitals:   02/26/19 1930 02/26/19 2000 02/26/19 2030  02/26/19 2100  BP: 110/61 (!) 104/58 (!) 108/59 (!) 112/59  Pulse: (!) 57 (!) 55 (!) 55 (!) 57  Resp: 18 20 18 19   Temp:      TempSrc:      SpO2: 99% 98% 98% 97%  Weight:      Height:       General appearance: Overweight woman in no acute distress who looks fatigued  eyes: PERRL, lids and conjunctivae normal ENMT: Mucous membranes are moist. Posterior pharynx clear of any exudate or lesions.edentulous with full dentures Neck: Anterior neck with an old tracheostomy scar normal, supple, no masses, no thyromegaly Respiratory: clear to auscultation bilaterally, no wheezing, minimal crackles left base. Normal respiratory effort. No accessory muscle use.  Cardiovascular: Regular rate and rhythm, no murmurs / rubs / gallops. No extremity edema. 2+ pedal pulses. No carotid bruits.  Abdomen: Obese, no tenderness, no masses palpated. No hepatosplenomegaly. Bowel sounds positive.  Musculoskeletal: no clubbing / cyanosis. No joint deformity upper and lower extremities. Good ROM, no contractures. Normal muscle tone.  Skin: no rashes, lesions, ulcers. No induration Neurologic: CN 2-12 grossly intact. Sensation intact. . Strength 5/5 in all 4.  Psychiatric: Normal judgment and insight. Alert and oriented x 3. Normal mood.     Labs on Admission: I have personally reviewed following labs and imaging studies  CBC: Recent Labs  Lab 02/26/19 1314  WBC 3.9*  HGB 14.8  HCT 44.6  MCV 93.3  PLT 681   Basic Metabolic Panel: Recent Labs  Lab 02/26/19 1314  NA 135  K 3.2*  CL 102  CO2 20*  GLUCOSE 92  BUN 15  CREATININE 0.75  CALCIUM 8.7*   GFR: Estimated Creatinine Clearance: 79 mL/min (by C-G formula based on SCr of 0.75 mg/dL). Liver Function Tests: No results for input(s): AST, ALT, ALKPHOS, BILITOT, PROT, ALBUMIN in the last 168 hours. No results for input(s): LIPASE, AMYLASE in the last 168 hours. No results for input(s): AMMONIA in the last 168 hours. Coagulation Profile: No  results for input(s): INR, PROTIME in the last 168 hours. Cardiac Enzymes: No results for input(s): CKTOTAL, CKMB, CKMBINDEX, TROPONINI in the last 168 hours. BNP (last 3 results) No results for input(s): PROBNP in the last 8760 hours. HbA1C: No results for input(s): HGBA1C in the last 72 hours. CBG: Recent Labs  Lab 02/26/19 1405  GLUCAP 93   Lipid Profile: No results for input(s): CHOL, HDL, LDLCALC, TRIG, CHOLHDL, LDLDIRECT in the last 72 hours. Thyroid Function Tests: No results for input(s): TSH, T4TOTAL, FREET4, T3FREE, THYROIDAB in the last 72 hours. Anemia Panel: No results for input(s): VITAMINB12, FOLATE, FERRITIN, TIBC, IRON, RETICCTPCT in the last 72 hours. Urine analysis: No results found for: COLORURINE, APPEARANCEUR, LABSPEC, PHURINE, GLUCOSEU, HGBUR, BILIRUBINUR, KETONESUR, PROTEINUR, UROBILINOGEN, NITRITE, LEUKOCYTESUR  Radiological Exams on Admission: Dg Chest Portable 1 View  Result Date: 02/26/2019 CLINICAL  DATA:  Weakness with syncope EXAM: PORTABLE CHEST 1 VIEW COMPARISON:  October 02, 2008 FINDINGS: There is slight bibasilar atelectasis. There is no edema or consolidation. Heart size and pulmonary vascularity are normal. No adenopathy. There is aortic atherosclerosis. There is postoperative change in the lower cervical region. IMPRESSION: Bibasilar atelectasis. No edema or consolidation. Heart size within normal limits. Aortic Atherosclerosis (ICD10-I70.0). Electronically Signed   By: Lowella Grip III M.D.   On: 02/26/2019 15:35    EKG: Independently reviewed. NSR, low voltage. Old septal infarct  Assessment/Plan Active Problems:   COVID-19 virus infection   COPD (chronic obstructive pulmonary disease) (HCC)   Orthostatic hypotension   Syncope  (please populate well all problems here in Problem List. (For example, if patient is on BP meds at home and you resume or decide to hold them, it is a problem that needs to be her. Same for CAD, COPD, HLD and so  on)   1.  Syncope -patient with episodes of syncope and near syncope most likely secondary to orthostatic hypotension due to dehydration.  History of cardiac arrhythmias noted.  He has had no arrhythmias on telemetry. Plan telemetry observation  Hydration with IV fluids  Monitor orthostatic vital signs daily  2.  COVID-19 virus infection -patient with very typical symptoms of weakness and fatigue, nausea, diarrhea, malaise and mild increase in her baseline shortness of breath.  Patient has no acute changes on chest x-ray.  Room air oxygen saturation was 90%.  Given her age and her COPD history she is at increased risk for progressive symptoms. Plan admit to [facility]  No steroids or antiviral agents unless her condition should worsen.  3.  COPD -patient has COPD and is managed at home with her dose inhalers. Plan continue home medication.  We will add an AeroChamber to her metered-dose inhalers for better   penetration.  DVT prophylaxis: Lovenox (Lovenox/Heparin/SCD's/anticoagulated/None (if comfort care) Code Status: Full code (Full/Partial (specify details) Family Communication: Spoke with the patient's husband by telephone explaining her diagnosis and the plan of treatment.  (Specify name, relationship. Do not write "discussed with patient". Specify tel # if discussed over the phone) Disposition Plan: Home in 2 midnights (specify when and where you expect patient to be discharged) Consults called: None (with names) Admission status: Observation-telemetry (inpatient / obs / tele / medical floor / SDU)  Examiner was in full PPE: Double gloves, gown, N 95 mask, surgical mask, face shield.  Appropriate and doffing procedures were followed as well as hand hygiene.  Adella Hare MD [facility] Pager [phone]  If 7PM-7AM, please contact night-coverage [email or web address] Password [facility]  02/26/2019, 9:40 PM

## 2019-02-27 ENCOUNTER — Other Ambulatory Visit: Payer: Self-pay

## 2019-02-27 DIAGNOSIS — M25552 Pain in left hip: Secondary | ICD-10-CM | POA: Diagnosis present

## 2019-02-27 DIAGNOSIS — E785 Hyperlipidemia, unspecified: Secondary | ICD-10-CM | POA: Diagnosis present

## 2019-02-27 DIAGNOSIS — I10 Essential (primary) hypertension: Secondary | ICD-10-CM | POA: Diagnosis present

## 2019-02-27 DIAGNOSIS — I4510 Unspecified right bundle-branch block: Secondary | ICD-10-CM | POA: Diagnosis present

## 2019-02-27 DIAGNOSIS — A09 Infectious gastroenteritis and colitis, unspecified: Secondary | ICD-10-CM

## 2019-02-27 DIAGNOSIS — J440 Chronic obstructive pulmonary disease with acute lower respiratory infection: Secondary | ICD-10-CM | POA: Diagnosis present

## 2019-02-27 DIAGNOSIS — U071 COVID-19: Secondary | ICD-10-CM | POA: Diagnosis present

## 2019-02-27 DIAGNOSIS — Z86711 Personal history of pulmonary embolism: Secondary | ICD-10-CM | POA: Diagnosis not present

## 2019-02-27 DIAGNOSIS — G8929 Other chronic pain: Secondary | ICD-10-CM | POA: Diagnosis present

## 2019-02-27 DIAGNOSIS — Z79899 Other long term (current) drug therapy: Secondary | ICD-10-CM | POA: Diagnosis not present

## 2019-02-27 DIAGNOSIS — F17210 Nicotine dependence, cigarettes, uncomplicated: Secondary | ICD-10-CM | POA: Diagnosis present

## 2019-02-27 DIAGNOSIS — Z8542 Personal history of malignant neoplasm of other parts of uterus: Secondary | ICD-10-CM | POA: Diagnosis not present

## 2019-02-27 DIAGNOSIS — Z90710 Acquired absence of both cervix and uterus: Secondary | ICD-10-CM | POA: Diagnosis not present

## 2019-02-27 DIAGNOSIS — Z825 Family history of asthma and other chronic lower respiratory diseases: Secondary | ICD-10-CM | POA: Diagnosis not present

## 2019-02-27 DIAGNOSIS — R55 Syncope and collapse: Secondary | ICD-10-CM | POA: Diagnosis not present

## 2019-02-27 DIAGNOSIS — Z86718 Personal history of other venous thrombosis and embolism: Secondary | ICD-10-CM | POA: Diagnosis not present

## 2019-02-27 DIAGNOSIS — I2510 Atherosclerotic heart disease of native coronary artery without angina pectoris: Secondary | ICD-10-CM | POA: Diagnosis present

## 2019-02-27 DIAGNOSIS — Z801 Family history of malignant neoplasm of trachea, bronchus and lung: Secondary | ICD-10-CM | POA: Diagnosis not present

## 2019-02-27 DIAGNOSIS — Z841 Family history of disorders of kidney and ureter: Secondary | ICD-10-CM | POA: Diagnosis not present

## 2019-02-27 DIAGNOSIS — J9811 Atelectasis: Secondary | ICD-10-CM | POA: Diagnosis present

## 2019-02-27 DIAGNOSIS — J1289 Other viral pneumonia: Secondary | ICD-10-CM | POA: Diagnosis present

## 2019-02-27 DIAGNOSIS — Z8249 Family history of ischemic heart disease and other diseases of the circulatory system: Secondary | ICD-10-CM | POA: Diagnosis not present

## 2019-02-27 DIAGNOSIS — I951 Orthostatic hypotension: Secondary | ICD-10-CM | POA: Diagnosis present

## 2019-02-27 DIAGNOSIS — Z833 Family history of diabetes mellitus: Secondary | ICD-10-CM | POA: Diagnosis not present

## 2019-02-27 DIAGNOSIS — J9601 Acute respiratory failure with hypoxia: Secondary | ICD-10-CM | POA: Diagnosis present

## 2019-02-27 DIAGNOSIS — E860 Dehydration: Secondary | ICD-10-CM | POA: Diagnosis present

## 2019-02-27 LAB — COMPREHENSIVE METABOLIC PANEL
ALT: 27 U/L (ref 0–44)
AST: 31 U/L (ref 15–41)
Albumin: 3.1 g/dL — ABNORMAL LOW (ref 3.5–5.0)
Alkaline Phosphatase: 65 U/L (ref 38–126)
Anion gap: 10 (ref 5–15)
BUN: 14 mg/dL (ref 8–23)
CO2: 21 mmol/L — ABNORMAL LOW (ref 22–32)
Calcium: 7.9 mg/dL — ABNORMAL LOW (ref 8.9–10.3)
Chloride: 107 mmol/L (ref 98–111)
Creatinine, Ser: 0.6 mg/dL (ref 0.44–1.00)
GFR calc Af Amer: >60 mL/min (ref >60)
GFR calc non Af Amer: >60 mL/min (ref >60)
Glucose, Bld: 89 mg/dL (ref 70–99)
Potassium: 3.2 mmol/L — ABNORMAL LOW (ref 3.5–5.1)
Sodium: 138 mmol/L (ref 135–145)
Total Bilirubin: 0.2 mg/dL — ABNORMAL LOW (ref 0.3–1.2)
Total Protein: 6.1 g/dL — ABNORMAL LOW (ref 6.5–8.1)

## 2019-02-27 LAB — CBC WITH DIFFERENTIAL/PLATELET
Abs Immature Granulocytes: 0.01 10*3/uL (ref 0.00–0.07)
Basophils Absolute: 0 10*3/uL (ref 0.0–0.1)
Basophils Relative: 0 %
Eosinophils Absolute: 0 10*3/uL (ref 0.0–0.5)
Eosinophils Relative: 0 %
HCT: 39.2 % (ref 36.0–46.0)
Hemoglobin: 12.8 g/dL (ref 12.0–15.0)
Immature Granulocytes: 0 %
Lymphocytes Relative: 32 %
Lymphs Abs: 1.1 10*3/uL (ref 0.7–4.0)
MCH: 31 pg (ref 26.0–34.0)
MCHC: 32.7 g/dL (ref 30.0–36.0)
MCV: 94.9 fL (ref 80.0–100.0)
Monocytes Absolute: 0.3 10*3/uL (ref 0.1–1.0)
Monocytes Relative: 8 %
Neutro Abs: 2.1 10*3/uL (ref 1.7–7.7)
Neutrophils Relative %: 60 %
Platelets: 205 10*3/uL (ref 150–400)
RBC: 4.13 MIL/uL (ref 3.87–5.11)
RDW: 13 % (ref 11.5–15.5)
WBC: 3.5 10*3/uL — ABNORMAL LOW (ref 4.0–10.5)
nRBC: 0 % (ref 0.0–0.2)

## 2019-02-27 LAB — C-REACTIVE PROTEIN: CRP: 1.9 mg/dL — ABNORMAL HIGH (ref <1.0)

## 2019-02-27 LAB — PHOSPHORUS: Phosphorus: 2.9 mg/dL (ref 2.5–4.6)

## 2019-02-27 LAB — MAGNESIUM: Magnesium: 2.1 mg/dL (ref 1.7–2.4)

## 2019-02-27 LAB — D-DIMER, QUANTITATIVE: D-Dimer, Quant: 1.95 ug/mL-FEU — ABNORMAL HIGH (ref 0.00–0.50)

## 2019-02-27 LAB — FERRITIN: Ferritin: 384 ng/mL — ABNORMAL HIGH (ref 11–307)

## 2019-02-27 MED ORDER — SODIUM CHLORIDE 0.9 % IV SOLN
Freq: Once | INTRAVENOUS | Status: AC
  Administered 2019-02-27: 04:00:00 via INTRAVENOUS

## 2019-02-27 MED ORDER — POTASSIUM CHLORIDE CRYS ER 20 MEQ PO TBCR
40.0000 meq | EXTENDED_RELEASE_TABLET | Freq: Once | ORAL | Status: AC
  Administered 2019-02-27: 40 meq via ORAL
  Filled 2019-02-27: qty 2

## 2019-02-27 MED ORDER — DEXAMETHASONE 6 MG PO TABS: 6.0000 mg | ORAL_TABLET | Freq: Every day | ORAL | Status: DC

## 2019-02-27 MED ORDER — SODIUM CHLORIDE 0.9 % IV SOLN
200.0000 mg | Freq: Once | INTRAVENOUS | Status: AC
  Administered 2019-02-27: 200 mg via INTRAVENOUS
  Filled 2019-02-27: qty 40

## 2019-02-27 MED ORDER — METHYLPREDNISOLONE SODIUM SUCC 40 MG IJ SOLR
40.0000 mg | Freq: Two times a day (BID) | INTRAMUSCULAR | Status: DC
  Administered 2019-02-27 – 2019-03-02 (×7): 40 mg via INTRAVENOUS
  Filled 2019-02-27 (×7): qty 1

## 2019-02-27 MED ORDER — SODIUM CHLORIDE 0.9 % IV SOLN
100.0000 mg | INTRAVENOUS | Status: AC
  Administered 2019-02-28 – 2019-03-03 (×4): 100 mg via INTRAVENOUS
  Filled 2019-02-27 (×4): qty 20

## 2019-02-27 MED ORDER — SODIUM CHLORIDE 0.9 % IV SOLN
INTRAVENOUS | Status: AC
  Administered 2019-02-27 – 2019-02-28 (×2): via INTRAVENOUS

## 2019-02-27 NOTE — Progress Notes (Signed)
PROGRESS NOTE                                                                                                                                                                                                             Patient Demographics:    Maria [name], is a 63 y.o. female, DOB - 1955/11/30, HWT:[id number]  Outpatient Primary MD for the patient is Chelsea Primus, MD   Admit date - 02/26/2019   LOS - 0  Chief Complaint  Patient presents with  . Loss of Consciousness  . Chest Pain       Brief Narrative: Patient is a 63 y.o. female with PMHx of COPD, remote history of PE approximately 15 years back, remote history of uterine cancer s/p hysterectomy-presenting with 3-day history of nausea, diarrhea and 2 episodes of syncope.  She also has complaints of mild cough and worsening shortness of breath with ambulation.  She was found to have COVID-19 and admitted to the hospitalist service.   Subjective:    Maria [name] today still feels terrible-and diarrhea has slowed down-but she has myalgias and generalized weakness/fatigue.  Her O2 saturation on room air this morning (per RN) was around 84-86%-she now is requiring 2 L of oxygen to maintain O2 saturation   Assessment  & Plan :   Acute Hypoxic Resp Failure due to Covid 19 Viral pneumonia: Requiring 2 L of oxygen to maintain O2 saturation (O2 saturation fell to 84-86% on room air).  Will change Decadron to Solu-Medrol, appears to have chest x-ray changes consistent with COVID pneumonia (my personal reading)-we will order Remdesivir.  She is very early into her illness-and could a worsening of her hypoxemia.  Fever: Afebrile  O2 requirements: 2 L via [place]  COVID-19 Labs: Recent Labs    02/27/19 0345  DDIMER 1.95*  FERRITIN 384*  CRP 1.9*    COVID-19 Medications: Steroids:Decardron 8/11>>8/12, Solumedrol 8/12 Remdesivir:8/12>> Actemra:Not indicated Convalescent  Plasma:N/A N/A Research Studies:  Other medications: Diuretics:no indication Antibiotics:no indication  Diarrhea: Secondary to COVID-19-has improved-continue PRN Imodium  Syncope: History very suggestive of orthostatic hypotension due to diarrhea-continue to hydrate, recheck orthostatic vital signs.  Ambulate with PT.  Telemetry appears unremarkable.  HTN: Blood pressure currently stable-given concern for orthostatic hypotension-stop all antihypertensives and monitor.  COPD: No signs of exacerbation-continue bronchodilators  Remote history of pulmonary embolism (>  15years back): No longer on anticoagulation-on prophylactic anticoagulation with Lovenox  Remote history of uterine cancer s/p hysterectomy (> 30 years back)  ABG: No results found for: PHART, PCO2ART, PO2ART, HCO3, TCO2, ACIDBASEDEF, O2SAT  Vent Settings:N/A  Condition - Stable  Family Communication  :  Spouse updated over the phone  Code Status :  Full Code  Diet :  Diet Order            Diet Heart Room service appropriate? Yes; Fluid consistency: Thin  Diet effective now               Disposition Plan  :  Remain hospitalized-not stable for discharge at this point  Consults  :  None  Procedures  :  None  DVT Prophylaxis  :  Lovenox  Lab Results  Component Value Date   PLT 205 02/27/2019    Antibiotics  :    Anti-infectives (From admission, onward)   None      Inpatient Medications  Scheduled Meds: . amLODipine  10 mg Oral Daily  . enoxaparin (LOVENOX) injection  40 mg Subcutaneous Q24H  . fluticasone furoate-vilanterol  1 puff Inhalation Daily   And  . umeclidinium bromide  1 puff Inhalation Daily  . methylPREDNISolone (SOLU-MEDROL) injection  40 mg Intravenous Q12H  . nortriptyline  25 mg Oral QHS  . pregabalin  50 mg Oral TID  . senna  1 tablet Oral BID  . sodium chloride flush  3 mL Intravenous Once  . venlafaxine XR  75 mg Oral Daily   Continuous Infusions: . sodium chloride  75 mL/hr at 02/27/19 1045   PRN Meds:.acetaminophen, albuterol, traMADol   Time Spent in minutes  25   See all Orders from today for further details   Oren Binet M.D on 02/27/2019 at 11:40 AM  To page go to [email or web address] - use universal password  [facility] -  Office  [phone]    Objective:   Vitals:   02/26/19 2337 02/27/19 0000 02/27/19 0100 02/27/19 0205  BP: (!) 105/47 (!) 113/57 (!) 119/52   Pulse: (!) 57 (!) 56 (!) 59   Resp:  20 (!) 23   Temp: 98.6 F (37 C)   98.8 F (37.1 C)  TempSrc: Oral   Oral  SpO2: 97% 98% 97%   Weight:      Height:        Wt Readings from Last 3 Encounters:  02/26/19 82.6 kg     Intake/Output Summary (Last 24 hours) at 02/27/2019 1140 Last data filed at 02/27/2019 2836 Gross per 24 hour  Intake 2593.13 ml  Output -  Net 2593.13 ml     Physical Exam Gen Exam:Alert awake-not in any distress HEENT:atraumatic, normocephalic Chest: B/L clear to auscultation anteriorly CVS:S1S2 regular Abdomen:soft non tender, non distended Extremities:no edema Neurology: Non focal Skin: no rash   Data Review:    CBC Recent Labs  Lab 02/26/19 1314 02/27/19 0345  WBC 3.9* 3.5*  HGB 14.8 12.8  HCT 44.6 39.2  PLT 255 205  MCV 93.3 94.9  MCH 31.0 31.0  MCHC 33.2 32.7  RDW 12.7 13.0  LYMPHSABS  --  1.1  MONOABS  --  0.3  EOSABS  --  0.0  BASOSABS  --  0.0    Chemistries  Recent Labs  Lab 02/26/19 1314 02/27/19 0345  NA 135 138  K 3.2* 3.2*  CL 102 107  CO2 20* 21*  GLUCOSE 92 89  BUN 15 14  CREATININE  0.75 0.60  CALCIUM 8.7* 7.9*  MG  --  2.1  AST  --  31  ALT  --  27  ALKPHOS  --  65  BILITOT  --  0.2*   ------------------------------------------------------------------------------------------------------------------ No results for input(s): CHOL, HDL, LDLCALC, TRIG, CHOLHDL, LDLDIRECT in the last 72 hours.  No results found for: HGBA1C  ------------------------------------------------------------------------------------------------------------------ No results for input(s): TSH, T4TOTAL, T3FREE, THYROIDAB in the last 72 hours.  Invalid input(s): FREET3 ------------------------------------------------------------------------------------------------------------------ Recent Labs    02/27/19 0345  FERRITIN 384*    Coagulation profile No results for input(s): INR, PROTIME in the last 168 hours.  Recent Labs    02/27/19 0345  DDIMER 1.95*    Cardiac Enzymes No results for input(s): CKMB, TROPONINI, MYOGLOBIN in the last 168 hours.  Invalid input(s): CK ------------------------------------------------------------------------------------------------------------------ No results found for: BNP  Micro Results Recent Results (from the past 240 hour(s))  SARS Coronavirus 2 [facility] order, Performed in [facility] hospital lab) Nasopharyngeal Nasopharyngeal Swab     Status: Abnormal   Collection Time: 02/26/19  2:22 PM   Specimen: Nasopharyngeal Swab  Result Value Ref Range Status   SARS Coronavirus 2 POSITIVE (A) NEGATIVE Final    Comment: RESULT CALLED TO, READ BACK BY AND VERIFIED WITH: Sheilah Pigeon RN 15:30 02/26/19 ([email or web address]) (NOTE) If result is NEGATIVE SARS-CoV-2 target nucleic acids are NOT DETECTED. The SARS-CoV-2 RNA is generally detectable in upper and lower  respiratory specimens during the acute phase of infection. The lowest  concentration of SARS-CoV-2 viral copies this assay can detect is 250  copies / mL. A negative result does not preclude SARS-CoV-2 infection  and should not be used as the sole basis for treatment or other  patient management decisions.  A negative result may occur with  improper specimen collection / handling, submission of specimen other  than nasopharyngeal swab, presence of viral mutation(s) within the  areas targeted by this assay, and inadequate number of viral copies  (<250  copies / mL). A negative result must be combined with clinical  observations, patient history, and epidemiological information. If result is POSITIVE SARS-CoV-2 target nucleic acids are DETECTED.  The SARS-CoV-2 RNA is generally detectable in upper and lower  respiratory specimens during the acute phase of infection.  Positive  results are indicative of active infection with SARS-CoV-2.  Clinical  correlation with patient history and other diagnostic information is  necessary to determine patient infection status.  Positive results do  not rule out bacterial infection or co-infection with other viruses. If result is PRESUMPTIVE POSTIVE SARS-CoV-2 nucleic acids MAY BE PRESENT.   A presumptive positive result was obtained on the submitted specimen  and confirmed on repeat testing.  While 2019 novel coronavirus  (SARS-CoV-2) nucleic acids may be present in the submitted sample  additional confirmatory testing may be necessary for epidemiological  and / or clinical management purposes  to differentiate between  SARS-CoV-2 and other Sarbecovirus currently known to infect humans.  If clinically indicated additional testing with an alternate test  methodology [phone]) i s advised. The SARS-CoV-2 RNA is generally  detectable in upper and lower respiratory specimens during the acute  phase of infection. The expected result is Negative. Fact Sheet for Patients:  StrictlyIdeas.no Fact Sheet for Healthcare Providers: BankingDealers.co.za This test is not yet approved or cleared by the [place] FDA and has been authorized for detection and/or diagnosis of SARS-CoV-2 by FDA under an Emergency Use Authorization (EUA).  This EUA will remain in effect (meaning this  test can be used) for the duration of the COVID-19 declaration under Section 564(b)(1) of the Act, 21 U.S.C. section 360bbb-3(b)(1), unless the authorization is terminated or revoked  sooner. Performed at [facility], [place] [address] [place], [place] [address]     Radiology Reports Dg Chest Portable 1 View  Result Date: 02/26/2019 CLINICAL DATA:  Weakness with syncope EXAM: PORTABLE CHEST 1 VIEW COMPARISON:  October 02, 2008 FINDINGS: There is slight bibasilar atelectasis. There is no edema or consolidation. Heart size and pulmonary vascularity are normal. No adenopathy. There is aortic atherosclerosis. There is postoperative change in the lower cervical region. IMPRESSION: Bibasilar atelectasis. No edema or consolidation. Heart size within normal limits. Aortic Atherosclerosis (ICD10-I70.0). Electronically Signed   By: Lowella Grip III M.D.   On: 02/26/2019 15:35

## 2019-02-27 NOTE — Progress Notes (Signed)
Pt was admitted for COVID. O2 sats drop to mid 80s today. Suspect for COVID PNA. Remdesivir started.   ALT wnl Scr<1  Remdesivir 200mg  IV x1 then 100mg  IV q24 x4  Onnie Boer, PharmD, [place], AAHIVP, CPP Infectious Disease Pharmacist 02/27/2019 12:17 PM

## 2019-02-27 NOTE — Plan of Care (Addendum)
Patient on 2L oxygen via [place],  alert and responsive with no distress noted.  Tolerated medications with no adverse reactions noted.  Denies pain/discomfort at this time.   Bed in lowest position, callbell within reach and continue to monitor for safety.  Problem: Respiratory: Goal: Will maintain a patent airway Outcome: Progressing   Problem: Activity: Goal: Ability to tolerate increased activity will improve Outcome: Progressing Goal: Will verbalize the importance of balancing activity with adequate rest periods Outcome: Progressing   Problem: Respiratory: Goal: Ability to maintain adequate ventilation will improve Outcome: Progressing

## 2019-02-27 NOTE — Progress Notes (Signed)
Patients oxygen saturation dropped into 80s with just room air today. Had to apply oxygen at 2 lpm via nasal cannula. sats at 95%.

## 2019-02-27 NOTE — ED Notes (Signed)
[facility] eta about 40 mins

## 2019-02-28 DIAGNOSIS — J1289 Other viral pneumonia: Secondary | ICD-10-CM

## 2019-02-28 LAB — CBC WITH DIFFERENTIAL/PLATELET
Abs Immature Granulocytes: 0.01 10*3/uL (ref 0.00–0.07)
Basophils Absolute: 0 10*3/uL (ref 0.0–0.1)
Basophils Relative: 0 %
Eosinophils Absolute: 0 10*3/uL (ref 0.0–0.5)
Eosinophils Relative: 0 %
HCT: 38 % (ref 36.0–46.0)
Hemoglobin: 12.2 g/dL (ref 12.0–15.0)
Immature Granulocytes: 0 %
Lymphocytes Relative: 34 %
Lymphs Abs: 0.8 10*3/uL (ref 0.7–4.0)
MCH: 30.8 pg (ref 26.0–34.0)
MCHC: 32.1 g/dL (ref 30.0–36.0)
MCV: 96 fL (ref 80.0–100.0)
Monocytes Absolute: 0.1 10*3/uL (ref 0.1–1.0)
Monocytes Relative: 6 %
Neutro Abs: 1.4 10*3/uL — ABNORMAL LOW (ref 1.7–7.7)
Neutrophils Relative %: 60 %
Platelets: 179 10*3/uL (ref 150–400)
RBC: 3.96 MIL/uL (ref 3.87–5.11)
RDW: 12.9 % (ref 11.5–15.5)
WBC: 2.3 10*3/uL — ABNORMAL LOW (ref 4.0–10.5)
nRBC: 0 % (ref 0.0–0.2)

## 2019-02-28 LAB — COMPREHENSIVE METABOLIC PANEL
ALT: 32 U/L (ref 0–44)
AST: 40 U/L (ref 15–41)
Albumin: 2.9 g/dL — ABNORMAL LOW (ref 3.5–5.0)
Alkaline Phosphatase: 65 U/L (ref 38–126)
Anion gap: 8 (ref 5–15)
BUN: 14 mg/dL (ref 8–23)
CO2: 22 mmol/L (ref 22–32)
Calcium: 8.2 mg/dL — ABNORMAL LOW (ref 8.9–10.3)
Chloride: 107 mmol/L (ref 98–111)
Creatinine, Ser: 0.6 mg/dL (ref 0.44–1.00)
GFR calc Af Amer: >60 mL/min (ref >60)
GFR calc non Af Amer: >60 mL/min (ref >60)
Glucose, Bld: 104 mg/dL — ABNORMAL HIGH (ref 70–99)
Potassium: 4.3 mmol/L (ref 3.5–5.1)
Sodium: 137 mmol/L (ref 135–145)
Total Bilirubin: 0.3 mg/dL (ref 0.3–1.2)
Total Protein: 5.9 g/dL — ABNORMAL LOW (ref 6.5–8.1)

## 2019-02-28 LAB — FERRITIN: Ferritin: 536 ng/mL — ABNORMAL HIGH (ref 11–307)

## 2019-02-28 LAB — C-REACTIVE PROTEIN: CRP: 1.5 mg/dL — ABNORMAL HIGH (ref <1.0)

## 2019-02-28 LAB — MAGNESIUM: Magnesium: 1.9 mg/dL (ref 1.7–2.4)

## 2019-02-28 LAB — D-DIMER, QUANTITATIVE: D-Dimer, Quant: 1.44 ug/mL-FEU — ABNORMAL HIGH (ref 0.00–0.50)

## 2019-02-28 MED ORDER — BENZONATATE 100 MG PO CAPS
200.0000 mg | ORAL_CAPSULE | Freq: Three times a day (TID) | ORAL | Status: DC
  Administered 2019-02-28 – 2019-03-03 (×9): 200 mg via ORAL
  Filled 2019-02-28 (×9): qty 2

## 2019-02-28 MED ORDER — HYDROCOD POLST-CPM POLST ER 10-8 MG/5ML PO SUER: 5.0000 mL | Freq: Two times a day (BID) | ORAL | Status: DC | PRN

## 2019-02-28 NOTE — Progress Notes (Signed)
PROGRESS NOTE                                                                                                                                                                                                             Patient Demographics:    Maria [name], is a 63 y.o. female, DOB - 1955/10/12, DZH:[id number]  Outpatient Primary MD for the patient is Chelsea Primus, MD   Admit date - 02/26/2019   LOS - 1  Chief Complaint  Patient presents with  . Loss of Consciousness  . Chest Pain       Brief Narrative: Patient is a 63 y.o. female with PMHx of COPD, remote history of PE approximately 15 years back, remote history of uterine cancer s/p hysterectomy-presenting with 3-day history of nausea, diarrhea and 2 episodes of syncope.  She also has complaints of mild cough and worsening shortness of breath with ambulation.  She was found to have COVID-19 and admitted to the hospitalist service.   Subjective:    Maria [name] today feels better-does not feel dizzy when standing up today.  No diarrhea.  Cough is better.   Assessment  & Plan :   Acute Hypoxic Resp Failure due to Covid 19 Viral pneumonia: Improved-titrated to room air.  Continue Solu-Medrol and Remdesivir.  Fever: Afebrile  O2 requirements: Titrated to room air (2 L yesterday)  COVID-19 Labs: Recent Labs    02/27/19 0345 02/28/19 0115  DDIMER 1.95* 1.44*  FERRITIN 384* 536*  CRP 1.9* 1.5*    COVID-19 Medications: Steroids:Decardron 8/11>>8/12, Solumedrol 8/12 Remdesivir:8/12>> Actemra:Not indicated Convalescent Plasma:N/A N/A Research Studies:  Other medications: Diuretics:no indication Antibiotics:no indication  Diarrhea: Secondary to COVID-19-has essentially resolved.    Syncope: History very suggestive of orthostatic hypotension due to diarrhea-symptomatology has improved today.  Recheck orthostatic vital signs tomorrow morning.  Continue to  ambulate with PT.  Telemetry unremarkable.  HTN: BP controlled-continue to hold all antihypertensives for now.   COPD: No signs of exacerbation-continue bronchodilators  Chronic left hip pain: Has been having this pain for the past 2 months-walks with the help of walker-follows with orthopedics at [facility]-orthopedics arranging for outpatient MRI per patient/family.  Continue supportive care.  Remote history of pulmonary embolism (> 15years back): No longer on anticoagulation-on prophylactic anticoagulation with Lovenox  Remote history of uterine cancer s/p hysterectomy (> 30 years  back)  ABG: No results found for: PHART, PCO2ART, PO2ART, HCO3, TCO2, ACIDBASEDEF, O2SAT  Vent Settings:N/A  Condition - Stable  Family Communication  :  Spouse updated over the phone 8/13  Code Status :  Full Code  Diet :  Diet Order            Diet Heart Room service appropriate? Yes; Fluid consistency: Thin  Diet effective now               Disposition Plan  :  Remain hospitalized-not stable for discharge at this point  Consults  :  None  Procedures  :  None  DVT Prophylaxis  :  Lovenox  Lab Results  Component Value Date   PLT 179 02/28/2019    Antibiotics  :    Anti-infectives (From admission, onward)   Start     Dose/Rate Route Frequency Ordered Stop   02/28/19 1300  remdesivir 100 mg in sodium chloride 0.9 % 250 mL IVPB     100 mg 500 mL/hr over 30 Minutes Intravenous Every 24 hours 02/27/19 1203 03/04/19 1259   02/27/19 1300  remdesivir 200 mg in sodium chloride 0.9 % 250 mL IVPB     200 mg 500 mL/hr over 30 Minutes Intravenous Once 02/27/19 1203 02/27/19 1357      Inpatient Medications  Scheduled Meds: . benzonatate  200 mg Oral TID  . enoxaparin (LOVENOX) injection  40 mg Subcutaneous Q24H  . fluticasone furoate-vilanterol  1 puff Inhalation Daily   And  . umeclidinium bromide  1 puff Inhalation Daily  . methylPREDNISolone (SOLU-MEDROL) injection  40 mg Intravenous Q12H   . nortriptyline  25 mg Oral QHS  . pregabalin  50 mg Oral TID  . senna  1 tablet Oral BID  . sodium chloride flush  3 mL Intravenous Once  . venlafaxine XR  75 mg Oral Daily   Continuous Infusions: . remdesivir 100 mg in NS 250 mL 100 mg (02/28/19 1200)   PRN Meds:.acetaminophen, albuterol, chlorpheniramine-HYDROcodone, traMADol   Time Spent in minutes  25   See all Orders from today for further details   Oren Binet M.D on 02/28/2019 at 2:54 PM  To page go to [email or web address] - use universal password  [facility] -  Office  [phone]    Objective:   Vitals:   02/27/19 0205 02/27/19 1945 02/28/19 0427 02/28/19 0725  BP:  117/60 124/61 (!) 115/58  Pulse:  62 (!) 56 65  Resp:  18  17  Temp: 98.8 F (37.1 C) 99.6 F (37.6 C) 97.7 F (36.5 C) 97.9 F (36.6 C)  TempSrc: Oral Oral Oral Oral  SpO2:  95% 96% 96%  Weight:      Height:        Wt Readings from Last 3 Encounters:  02/26/19 82.6 kg     Intake/Output Summary (Last 24 hours) at 02/28/2019 1454 Last data filed at 02/28/2019 0940 Gross per 24 hour  Intake 2920 ml  Output 900 ml  Net 2020 ml   Physical Exam Gen Exam:Alert awake-not in any distress HEENT:atraumatic, normocephalic Chest: B/L clear to auscultation anteriorly CVS:S1S2 regular Abdomen:soft non tender, non distended Extremities:no edema Neurology: Non focal Skin: no rash   Data Review:    CBC Recent Labs  Lab 02/26/19 1314 02/27/19 0345 02/28/19 0115  WBC 3.9* 3.5* 2.3*  HGB 14.8 12.8 12.2  HCT 44.6 39.2 38.0  PLT 255 205 179  MCV 93.3 94.9 96.0  MCH 31.0 31.0 30.8  MCHC 33.2 32.7 32.1  RDW 12.7 13.0 12.9  LYMPHSABS  --  1.1 0.8  MONOABS  --  0.3 0.1  EOSABS  --  0.0 0.0  BASOSABS  --  0.0 0.0    Chemistries  Recent Labs  Lab 02/26/19 1314 02/27/19 0345 02/28/19 0115  NA 135 138 137  K 3.2* 3.2* 4.3  CL 102 107 107  CO2 20* 21* 22  GLUCOSE 92 89 104*  BUN 15 14 14   CREATININE 0.75 0.60 0.60   CALCIUM 8.7* 7.9* 8.2*  MG  --  2.1 1.9  AST  --  31 40  ALT  --  27 32  ALKPHOS  --  65 65  BILITOT  --  0.2* 0.3   ------------------------------------------------------------------------------------------------------------------ No results for input(s): CHOL, HDL, LDLCALC, TRIG, CHOLHDL, LDLDIRECT in the last 72 hours.  No results found for: HGBA1C ------------------------------------------------------------------------------------------------------------------ No results for input(s): TSH, T4TOTAL, T3FREE, THYROIDAB in the last 72 hours.  Invalid input(s): FREET3 ------------------------------------------------------------------------------------------------------------------ Recent Labs    02/27/19 0345 02/28/19 0115  FERRITIN 384* 536*    Coagulation profile No results for input(s): INR, PROTIME in the last 168 hours.  Recent Labs    02/27/19 0345 02/28/19 0115  DDIMER 1.95* 1.44*    Cardiac Enzymes No results for input(s): CKMB, TROPONINI, MYOGLOBIN in the last 168 hours.  Invalid input(s): CK ------------------------------------------------------------------------------------------------------------------ No results found for: BNP  Micro Results Recent Results (from the past 240 hour(s))  SARS Coronavirus 2 [facility] order, Performed in [facility] hospital lab) Nasopharyngeal Nasopharyngeal Swab     Status: Abnormal   Collection Time: 02/26/19  2:22 PM   Specimen: Nasopharyngeal Swab  Result Value Ref Range Status   SARS Coronavirus 2 POSITIVE (A) NEGATIVE Final    Comment: RESULT CALLED TO, READ BACK BY AND VERIFIED WITH: Sheilah Pigeon RN 15:30 02/26/19 ([email or web address]) (NOTE) If result is NEGATIVE SARS-CoV-2 target nucleic acids are NOT DETECTED. The SARS-CoV-2 RNA is generally detectable in upper and lower  respiratory specimens during the acute phase of infection. The lowest  concentration of SARS-CoV-2 viral copies this assay can detect is 250  copies / mL. A  negative result does not preclude SARS-CoV-2 infection  and should not be used as the sole basis for treatment or other  patient management decisions.  A negative result may occur with  improper specimen collection / handling, submission of specimen other  than nasopharyngeal swab, presence of viral mutation(s) within the  areas targeted by this assay, and inadequate number of viral copies  (<250 copies / mL). A negative result must be combined with clinical  observations, patient history, and epidemiological information. If result is POSITIVE SARS-CoV-2 target nucleic acids are DETECTED.  The SARS-CoV-2 RNA is generally detectable in upper and lower  respiratory specimens during the acute phase of infection.  Positive  results are indicative of active infection with SARS-CoV-2.  Clinical  correlation with patient history and other diagnostic information is  necessary to determine patient infection status.  Positive results do  not rule out bacterial infection or co-infection with other viruses. If result is PRESUMPTIVE POSTIVE SARS-CoV-2 nucleic acids MAY BE PRESENT.   A presumptive positive result was obtained on the submitted specimen  and confirmed on repeat testing.  While 2019 novel coronavirus  (SARS-CoV-2) nucleic acids may be present in the submitted sample  additional confirmatory testing may be necessary for epidemiological  and / or clinical management purposes  to differentiate between  SARS-CoV-2 and other Sarbecovirus currently  known to infect humans.  If clinically indicated additional testing with an alternate test  methodology [id number]) i s advised. The SARS-CoV-2 RNA is generally  detectable in upper and lower respiratory specimens during the acute  phase of infection. The expected result is Negative. Fact Sheet for Patients:  StrictlyIdeas.no Fact Sheet for Healthcare Providers: BankingDealers.co.za This test is not  yet approved or cleared by the [place] FDA and has been authorized for detection and/or diagnosis of SARS-CoV-2 by FDA under an Emergency Use Authorization (EUA).  This EUA will remain in effect (meaning this test can be used) for the duration of the COVID-19 declaration under Section 564(b)(1) of the Act, 21 U.S.C. section 360bbb-3(b)(1), unless the authorization is terminated or revoked sooner. Performed at [facility], [place] [address], [place],  [address]     Radiology Reports Dg Chest Portable 1 View  Result Date: 02/26/2019 CLINICAL DATA:  Weakness with syncope EXAM: PORTABLE CHEST 1 VIEW COMPARISON:  October 02, 2008 FINDINGS: There is slight bibasilar atelectasis. There is no edema or consolidation. Heart size and pulmonary vascularity are normal. No adenopathy. There is aortic atherosclerosis. There is postoperative change in the lower cervical region. IMPRESSION: Bibasilar atelectasis. No edema or consolidation. Heart size within normal limits. Aortic Atherosclerosis (ICD10-I70.0). Electronically Signed   By: Lowella Grip III M.D.   On: 02/26/2019 15:35

## 2019-02-28 NOTE — Progress Notes (Signed)
Delivered to patient: Purple cell phone, white charger, and pair of black glasses

## 2019-02-28 NOTE — Plan of Care (Signed)
Patient on 2L oxygen and is alert and responsive with no distress noted.  Tolerated her medications with no adverse reactions manifested.  Denies pain and complains.  Bed in lowest position, callbell within reach and continue to monitor for safety. Problem: Education: Goal: Knowledge of risk factors and measures for prevention of condition will improve Outcome: Progressing   Problem: Respiratory: Goal: Will maintain a patent airway Outcome: Progressing Goal: Complications related to the disease process, condition or treatment will be avoided or minimized Outcome: Progressing   Problem: Activity: Goal: Ability to tolerate increased activity will improve Outcome: Progressing Goal: Will verbalize the importance of balancing activity with adequate rest periods Outcome: Progressing   Problem: Respiratory: Goal: Ability to maintain adequate ventilation will improve Outcome: Progressing

## 2019-02-28 NOTE — Evaluation (Signed)
Physical Therapy Evaluation Patient Details Name: Maria [name] MRN: [id number] DOB: 06/05/1956 Today's Date: 02/28/2019   History of Present Illness  Patient is a 63 y.o. female with PMHx of COPD, remote history of PE approximately 15 years back, remote history of uterine cancer s/p hysterectomy-presenting with 3-day history of nausea, diarrhea and 2 episodes of syncope.  She also has complaints of mild cough and worsening shortness of breath with ambulation.  She was found to have COVID-19 and admitted to the hospitalist service. Patient seen at [facility] on8/11 for left hip pain. Per PT, waitingg for MRI of lumbar spine to r/O othr pathology-has not been done as pt. now in hospital.  Clinical Impression  The patient reports Left hip pain for several weeks. Had OPPT eval 8/11. On hold for further treatment pending  MRI results(not  Yet done). The patient presents with LLE antalgic gait, relies on the RW. Pt admitted with above diagnosis. Pt currently with functional limitations due to the deficits listed below (see PT Problem List). Pt will benefit from skilled PT to increase their independence and safety with mobility to allow discharge to the venue listed below.       Follow Up Recommendations Outpatient PT(once MRI completed if cleared.)    Equipment Recommendations  None recommended by PT    Recommendations for Other Services   OT    Precautions / Restrictions Precautions Precautions: Fall Precaution Comments: syncopal episodes PTA      Mobility  Bed Mobility               General bed mobility comments: in recliner  Transfers Overall transfer level: Needs assistance Equipment used: Rolling walker (2 wheeled) Transfers: Sit to/from Stand Sit to Stand: Min assist         General transfer comment: extra effort to lean forward in recliner  Ambulation/Gait Ambulation/Gait assistance: Min assist Gait Distance (Feet): 25 Feet Assistive device: Rolling walker (2  wheeled) Gait Pattern/deviations: Step-to pattern;Antalgic Gait velocity: decr   General Gait Details: antalgic left leg, pt reports increased pain L hip/pelvis  Stairs            Wheelchair Mobility    Modified Rankin (Stroke Patients Only)       Balance Overall balance assessment: No apparent balance deficits (not formally assessed)                                           Pertinent Vitals/Pain Pain Assessment: 0-10 Pain Score: 8  Pain Location: left hip and posterior pelvis Pain Descriptors / Indicators: Aching;Cramping;Discomfort;Penetrating Pain Intervention(s): Repositioned    Home Living Family/patient expects to be discharged to:: Private residence Living Arrangements: Spouse/significant other Available Help at Discharge: Family Type of Home: House Home Access: Stairs to enter Entrance Stairs-Rails: Psychiatric nurse of Steps: 4 Home Layout: One level Home Equipment: Environmental consultant - 2 wheels;Cane - single point;Grab bars - tub/shower;Hand held shower head;Shower seat - built in      Prior Function Level of Independence: Independent with assistive device(s)         Comments: was still working, 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     [employer]        Extremity/Trunk Assessment   Upper Extremity Assessment Upper Extremity Assessment: Overall WFL for tasks assessed    Lower Extremity Assessment Lower Extremity Assessment: LLE deficits/detail LLE Deficits / Details: limited hip  extension, tends to keep flexed when standing LLE Sensation: WNL    Cervical / Trunk Assessment Cervical / Trunk Assessment: (rotated to right, offseting WB on LLE)  Communication      Cognition   Behavior During Therapy: [facility] for tasks assessed/performed Overall Cognitive Status: Within Functional Limits for tasks assessed                                        General Comments General comments (skin  integrity, edema, etc.): used RW for decr. WB LLE    Exercises     Assessment/Plan    PT Assessment Patient needs continued PT services  PT Problem List Decreased strength;Decreased mobility;Decreased range of motion;Decreased activity tolerance;Pain;Decreased balance       PT Treatment Interventions DME instruction;Therapeutic activities;Gait training;Therapeutic exercise;Stair training;Functional mobility training;Patient/family education    PT Goals (Current goals can be found in the Care Plan section)  Acute Rehab PT Goals Patient Stated Goal: pain relief, be back normal PT Goal Formulation: With patient Time For Goal Achievement: 03/14/19 Potential to Achieve Goals: Good    Frequency Min 3X/week   Barriers to discharge        Co-evaluation               AM-PAC PT "6 Clicks" Mobility  Outcome Measure Help needed turning from your back to your side while in a flat bed without using bedrails?: A Little Help needed moving from lying on your back to sitting on the side of a flat bed without using bedrails?: A Little Help needed moving to and from a bed to a chair (including a wheelchair)?: A Little Help needed standing up from a chair using your arms (e.g., wheelchair or bedside chair)?: A Little Help needed to walk in hospital room?: A Little Help needed climbing 3-5 steps with a railing? : A Lot 6 Click Score: 17    End of Session   Activity Tolerance: Patient limited by pain Patient left: in chair;with call bell/phone within reach Nurse Communication: Mobility status PT Visit Diagnosis: Difficulty in walking, not elsewhere classified (R26.2);Pain Pain - Right/Left: Left Pain - part of body: Hip    Time: 1010-1040 PT Time Calculation (min) (ACUTE ONLY): 30 min   Charges:   PT Evaluation $PT Eval Low Complexity: 1 Low PT Treatments $Gait Training: 8-22 mins        Tresa Endo PT Acute Rehabilitation Services Pager [phone] Office  [phone]   Claretha Cooper 02/28/2019, 10:56 AM

## 2019-03-01 LAB — COMPREHENSIVE METABOLIC PANEL
ALT: 51 U/L — ABNORMAL HIGH (ref 0–44)
AST: 46 U/L — ABNORMAL HIGH (ref 15–41)
Albumin: 3.1 g/dL — ABNORMAL LOW (ref 3.5–5.0)
Alkaline Phosphatase: 71 U/L (ref 38–126)
Anion gap: 8 (ref 5–15)
BUN: 16 mg/dL (ref 8–23)
CO2: 25 mmol/L (ref 22–32)
Calcium: 8.6 mg/dL — ABNORMAL LOW (ref 8.9–10.3)
Chloride: 105 mmol/L (ref 98–111)
Creatinine, Ser: 0.57 mg/dL (ref 0.44–1.00)
GFR calc Af Amer: >60 mL/min (ref >60)
GFR calc non Af Amer: >60 mL/min (ref >60)
Glucose, Bld: 146 mg/dL — ABNORMAL HIGH (ref 70–99)
Potassium: 4 mmol/L (ref 3.5–5.1)
Sodium: 138 mmol/L (ref 135–145)
Total Bilirubin: 0.2 mg/dL — ABNORMAL LOW (ref 0.3–1.2)
Total Protein: 6.4 g/dL — ABNORMAL LOW (ref 6.5–8.1)

## 2019-03-01 LAB — CBC WITH DIFFERENTIAL/PLATELET
Abs Immature Granulocytes: 0.04 10*3/uL (ref 0.00–0.07)
Basophils Absolute: 0 10*3/uL (ref 0.0–0.1)
Basophils Relative: 0 %
Eosinophils Absolute: 0 10*3/uL (ref 0.0–0.5)
Eosinophils Relative: 0 %
HCT: 39.4 % (ref 36.0–46.0)
Hemoglobin: 12.8 g/dL (ref 12.0–15.0)
Immature Granulocytes: 1 %
Lymphocytes Relative: 17 %
Lymphs Abs: 1 10*3/uL (ref 0.7–4.0)
MCH: 30.5 pg (ref 26.0–34.0)
MCHC: 32.5 g/dL (ref 30.0–36.0)
MCV: 94 fL (ref 80.0–100.0)
Monocytes Absolute: 0.3 10*3/uL (ref 0.1–1.0)
Monocytes Relative: 5 %
Neutro Abs: 4.6 10*3/uL (ref 1.7–7.7)
Neutrophils Relative %: 77 %
Platelets: 199 10*3/uL (ref 150–400)
RBC: 4.19 MIL/uL (ref 3.87–5.11)
RDW: 12.9 % (ref 11.5–15.5)
WBC: 5.9 10*3/uL (ref 4.0–10.5)
nRBC: 0 % (ref 0.0–0.2)

## 2019-03-01 LAB — FERRITIN: Ferritin: 745 ng/mL — ABNORMAL HIGH (ref 11–307)

## 2019-03-01 LAB — HIV ANTIBODY (ROUTINE TESTING W REFLEX): HIV Screen 4th Generation wRfx: NONREACTIVE

## 2019-03-01 LAB — MAGNESIUM: Magnesium: 1.9 mg/dL (ref 1.7–2.4)

## 2019-03-01 LAB — D-DIMER, QUANTITATIVE: D-Dimer, Quant: 1.34 ug/mL-FEU — ABNORMAL HIGH (ref 0.00–0.50)

## 2019-03-01 LAB — C-REACTIVE PROTEIN: CRP: 0.8 mg/dL (ref <1.0)

## 2019-03-01 NOTE — Progress Notes (Signed)
Occupational Therapy Evaluation Patient Details Name: Maria [name] MRN: [id number] DOB: 1955-12-02 Today's Date: 03/01/2019    History of Present Illness Patient is a 63 y.o. female with PMHx of COPD, remote history of PE approximately 15 years back, remote history of uterine cancer s/p hysterectomy-presenting with 3-day history of nausea, diarrhea and 2 episodes of syncope.  She also has complaints of mild cough and worsening shortness of breath with ambulation.  She was found to have COVID-19 and admitted to the hospitalist service. Patient [facility] at [facility] on[facility]/11 for left hip pain. Per PT, waitingg for MRI of lumbar spine to r/O othr pathology-has not been done as pt. now in hospital.   Clinical Impression   PTA, pt modified independent with mobility @ RW level, but had difficulty with LB ADL due to pain. 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   VSS during assessment on RA. Pt states she feels "much better". Will follow acutely to educate on use of AE for LB ADL to maximize functional level of independence and facilitate safe DC home.     Follow Up Recommendations  No OT follow up;Supervision - Intermittent    Equipment Recommendations  3 in 1 bedside commode    Recommendations for Other Services       Precautions / Restrictions Precautions Precautions: Fall      Mobility Bed Mobility Overal bed mobility: Modified Independent                Transfers Overall transfer level: Modified independent Equipment used: Rolling walker (2 wheeled) Transfers: Sit to/from [facility] Sit to Stand: Modified independent (Device/Increase time) Stand pivot transfers: Modified independent (Device/Increase time)            Balance Overall balance assessment: No apparent balance deficits (not formally assessed)                                         ADL either performed or assessed with clinical judgement   ADL Overall ADL's :  Needs assistance/impaired     Grooming: Set up;Standing   Upper Body Bathing: Set up;Sitting   Lower Body Bathing: Minimal assistance;Sit to/from stand   Upper Body Dressing : Set up;Sitting   Lower Body Dressing: Minimal assistance;Sit to/from stand   Toilet Transfer: BSC;RW;Modified Independent;Stand-pivot   Toileting- Clothing Manipulation and Hygiene: Set up;Sit to/from stand       Functional mobility during ADLs: Rolling walker;Modified independent General ADL Comments: Difficulty reaching feet; would benefit from AE     Vision Baseline Vision/History: Wears glasses       Perception     Praxis      Pertinent Vitals/Pain Pain Assessment: 0-10 Pain Score: 8  Pain Location: left hip and posterior pelvis Pain Descriptors / Indicators: Aching;Cramping;Discomfort;Penetrating Pain Intervention(s): Limited activity within patient's tolerance(walking on it makes it worse)     Hand Dominance Right   Extremity/Trunk Assessment Upper Extremity Assessment Upper Extremity Assessment: LUE deficits/detail LUE Deficits / Details: L shoulder pain occasionally   Lower Extremity Assessment Lower Extremity Assessment: LLE deficits/detail LLE Deficits / Details: limited hip extension, tends to keep flexed when standing   Cervical / Trunk Assessment Cervical / Trunk Assessment: Normal   Communication     Cognition Arousal/Alertness: Awake/alert Behavior During Therapy: WFL for tasks assessed/performed Overall Cognitive Status: Within Functional Limits for tasks assessed  General Comments       Exercises     Shoulder Instructions      Home Living Family/patient expects to be discharged to:: Private residence Living Arrangements: Spouse/significant other Available Help at Discharge: Family;Available 24 hours/day Type of Home: House Home Access: Stairs to enter [employer] of Steps: 4 Entrance  Stairs-Rails: Right;Left Home Layout: One level     Bathroom Shower/Tub: Occupational psychologist: Standard Bathroom Accessibility: Yes How Accessible: Accessible via walker Home Equipment: [place] - 2 wheels;Cane - single point;Grab bars - tub/shower;Hand held shower head;Shower seat - built in          Prior Functioning/Environment Level of Independence: Independent with assistive device(s)        Comments: was still working, 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 [place]        OT Problem List: Decreased strength;Decreased range of motion;Decreased activity tolerance;Decreased knowledge of use of DME or AE;Cardiopulmonary status limiting activity;Pain      OT Treatment/Interventions: Self-care/ADL training;Energy conservation;DME and/or AE instruction;Therapeutic activities;Patient/family education    OT Goals(Current goals can be found in the care plan section) Acute Rehab OT Goals Patient Stated Goal: pain relief, be back normal OT Goal Formulation: With patient Time For Goal Achievement: 03/15/19 Potential to Achieve Goals: Good  OT Frequency: Min 2X/week   Barriers to D/C:            Co-evaluation              AM-PAC OT "6 Clicks" Daily Activity     Outcome Measure Help from another person eating meals?: None Help from another person taking care of personal grooming?: A Little Help from another person toileting, which includes using toliet, bedpan, or urinal?: A Little Help from another person bathing (including washing, rinsing, drying)?: A Little Help from another person to put on and taking off regular upper body clothing?: A Little Help from another person to put on and taking off regular lower body clothing?: A Little 6 Click Score: 19   End of Session Equipment Utilized During Treatment: Rolling walker Nurse Communication: Mobility status  Activity Tolerance: Patient tolerated treatment well Patient left: in bed;with call  bell/phone within reach(per pt request due to painful L hip)  OT Visit Diagnosis: Other abnormalities of gait and mobility (R26.89);Muscle weakness (generalized) (M62.81);Pain Pain - Right/Left: Left Pain - part of body: Hip                Time: 1346-1410 OT Time Calculation (min): 24 min Charges:  OT General Charges $OT Visit: 1 Visit OT Evaluation $OT Eval Moderate Complexity: 1 Mod OT Treatments $Self Care/Home Management : 8-22 mins  Maurie Boettcher, OT/L   Acute OT Clinical Specialist [place] Pager [phone] Office [phone]   [facility] 03/01/2019, 2:12 PM

## 2019-03-01 NOTE — Progress Notes (Signed)
OT Treatment Note  Completed education regarding AE to assist with ADL. Pt modified independent with use of AE. Pt states she feels much better. OT goals met. OT signing off.     03/01/19 1700  OT Visit Information  Last OT Received On 03/01/19  Assistance Needed +1  History of Present Illness Patient is a 63 y.o. female with PMHx of COPD, remote history of PE approximately 15 years back, remote history of uterine cancer s/p hysterectomy-presenting with 3-day history of nausea, diarrhea and 2 episodes of syncope.  She also has complaints of mild cough and worsening shortness of breath with ambulation.  She was found to have COVID-19 and admitted to the hospitalist service. Patient seen at [facility] on8/11 for left hip pain. Per PT, waitingg for MRI of lumbar spine to r/O othr pathology-has not been done as pt. now in hospital.  Precautions  Precautions Fall  Pain Assessment  Pain Assessment 0-10  Pain Score 8  Pain Location left hip and posterior pelvis  Pain Descriptors / Indicators Aching;Cramping;Discomfort;Penetrating  Pain Intervention(s) Limited activity within patient's tolerance;Ice applied  Cognition  Arousal/Alertness Awake/alert  Behavior During Therapy WFL for tasks assessed/performed  Overall Cognitive Status Within Functional Limits for tasks assessed  Upper Extremity Assessment  Upper Extremity Assessment LUE deficits/detail  ADL  General ADL Comments Pt educated on use of AE for LB ADL. PT modified independent with use of AE with greatly reduce pain. Using reacher, sock aid, long handled shoehorna dn elastic shoestrings  General Comments  General comments (skin integrity, edema, etc.) Educated on strategies to reduce risk of falls  OT - End of Session  Activity Tolerance Patient tolerated treatment well  Patient left in bed;with call bell/phone within reach  Nurse Communication Mobility status  OT Assessment/Plan  OT Plan All goals met and education completed, patient  discharged from OT services  OT Visit Diagnosis Other abnormalities of gait and mobility (R26.89);Muscle weakness (generalized) (M62.81);Pain  Pain - Right/Left Left  Pain - part of body Hip  Follow Up Recommendations No OT follow up;Supervision - Intermittent  OT Equipment 3 in 1 bedside commode  AM-PAC OT "6 Clicks" Daily Activity Outcome Measure (Version 2)  Help from another person eating meals? 4  Help from another person taking care of personal grooming? 3  Help from another person toileting, which includes using toliet, bedpan, or urinal? 3  Help from another person bathing (including washing, rinsing, drying)? 3  Help from another person to put on and taking off regular upper body clothing? 3  Help from another person to put on and taking off regular lower body clothing? 3  6 Click Score 19  OT Goal Progression  Progress towards OT goals Goals met/education completed, patient discharged from OT  Acute Rehab OT Goals  Patient Stated Goal pain relief, be back normal  OT Goal Formulation With patient  Time For Goal Achievement 03/15/19  Potential to Achieve Goals Good  ADL Goals  Pt Will Perform Lower Body Bathing with modified independence;with adaptive equipment;sit to/from stand  Pt Will Perform Lower Body Dressing with modified independence;with adaptive equipment;sit to/from stand  Additional ADL Goal #1 Pt will independently verbalize 3 strategies to reduce risk of falls  OT Time Calculation  OT Start Time (ACUTE ONLY) 1622  OT Stop Time (ACUTE ONLY) 1639  OT Time Calculation (min) 17 min  OT General Charges  $OT Visit 1 Visit  OT Treatments  $Self Care/Home Management  8-22 mins  [facility], OT/L  Acute OT Clinical Specialist [place] Pager [phone] Office [phone]

## 2019-03-01 NOTE — Progress Notes (Signed)
PROGRESS NOTE                                                                                                                                                                                                             Patient Demographics:    Maria [name], is a 63 y.o. female, DOB - 13-Mar-1956, EXN:[id number]  Outpatient Primary MD for the patient is Chelsea Primus, MD   Admit date - 02/26/2019   LOS - 2  Chief Complaint  Patient presents with  . Loss of Consciousness  . Chest Pain       Brief Narrative: Patient is a 63 y.o. female with PMHx of COPD, remote history of PE approximately 15 years back, remote history of uterine cancer s/p hysterectomy-presenting with 3-day history of nausea, diarrhea, cough and shortness of breath along with 2 episodes of syncope.  She was found to have orthostatic hypotension.  She was subsequently admitted to the hospitalist service.    Subjective:    Maria [name] continues to feel better.  No vomiting or diarrhea.  She is no longer dizzy on standing up.   Assessment  & Plan :   Acute Hypoxic Resp Failure due to Covid 19 Viral pneumonia: Improved-continue Solu-Medrol and Remdesivir.  Fever: Afebrile  O2 requirements: Remains on room air (on 8/12-developed hypoxemia today mid 80s range and required oxygen)  COVID-19 Labs: Recent Labs    02/27/19 0345 02/28/19 0115 03/01/19 0322  DDIMER 1.95* 1.44* 1.34*  FERRITIN 384* 536* 745*  CRP 1.9* 1.5* 0.8    COVID-19 Medications: Steroids:Decardron 8/11>>8/12, Solumedrol 8/12>> Remdesivir:8/12>> Actemra:Not indicated Convalescent Plasma:N/A N/A Research Studies:  Other medications: Diuretics:no indication Antibiotics:no indication  Diarrhea: Secondary to COVID-19-has essentially resolved.    Syncope: History very suggestive of orthostatic hypotension due to diarrhea-symptomatology has improved today.  Orthostatic vital signs  are now negative.  Telemetry unremarkable.    HTN: BP controlled-but slowly creeping up-continue to hold antihypertensive and resume in the next few days    COPD: No signs of exacerbation-continue bronchodilators  Chronic left hip pain: Has been having this pain for the past 2 months-walks with the help of walker-follows with orthopedics at [facility]-orthopedics arranging for outpatient MRI per patient/family.  Continue supportive care.  Remote history of pulmonary embolism (> 15years back): No longer on anticoagulation-on prophylactic anticoagulation with Lovenox  Remote history  of uterine cancer s/p hysterectomy (> 30 years back)  ABG: No results found for: PHART, PCO2ART, PO2ART, HCO3, TCO2, ACIDBASEDEF, O2SAT  Vent Settings:N/A  Condition - Stable  Family Communication  :  Spouse updated over the phone 8/14  Code Status :  Full Code  Diet :  Diet Order            Diet Heart Room service appropriate? Yes; Fluid consistency: Thin  Diet effective now               Disposition Plan  :  Remain hospitalized-clinically improving-potential discharge on 8/16 or 8/17 after she completes 5 days of Remdesivir  Consults  :  None  Procedures  :  None  DVT Prophylaxis  :  Lovenox  Lab Results  Component Value Date   PLT 199 03/01/2019    Antibiotics  :    Anti-infectives (From admission, onward)   Start     Dose/Rate Route Frequency Ordered Stop   02/28/19 1300  remdesivir 100 mg in sodium chloride 0.9 % 250 mL IVPB     100 mg 500 mL/hr over 30 Minutes Intravenous Every 24 hours 02/27/19 1203 03/04/19 1259   02/27/19 1300  remdesivir 200 mg in sodium chloride 0.9 % 250 mL IVPB     200 mg 500 mL/hr over 30 Minutes Intravenous Once 02/27/19 1203 02/27/19 1357      Inpatient Medications  Scheduled Meds: . benzonatate  200 mg Oral TID  . enoxaparin (LOVENOX) injection  40 mg Subcutaneous Q24H  . fluticasone furoate-vilanterol  1 puff Inhalation Daily   And  . umeclidinium  bromide  1 puff Inhalation Daily  . methylPREDNISolone (SOLU-MEDROL) injection  40 mg Intravenous Q12H  . nortriptyline  25 mg Oral QHS  . pregabalin  50 mg Oral TID  . senna  1 tablet Oral BID  . sodium chloride flush  3 mL Intravenous Once  . venlafaxine XR  75 mg Oral Daily   Continuous Infusions: . remdesivir 100 mg in NS 250 mL 100 mg (03/01/19 1215)   PRN Meds:.acetaminophen, albuterol, chlorpheniramine-HYDROcodone, traMADol   Time Spent in minutes  25   See all Orders from today for further details   Oren Binet M.D on 03/01/2019 at 3:20 PM  To page go to [email or web address] - use universal password  [facility] -  Office  [phone]    Objective:   Vitals:   03/01/19 0300 03/01/19 0400 03/01/19 0730 03/01/19 1140  BP:   135/65 131/71  Pulse: (!) 57 (!) 56 63 63  Resp: (!) 23 (!) 23 14 15   Temp:   98.6 F (37 C) 98.5 F (36.9 C)  TempSrc:      SpO2: 90% 91% 92% 91%  Weight:      Height:        Wt Readings from Last 3 Encounters:  02/26/19 82.6 kg     Intake/Output Summary (Last 24 hours) at 03/01/2019 1520 Last data filed at 02/28/2019 1808 Gross per 24 hour  Intake 850 ml  Output -  Net 850 ml   Physical Exam Gen Exam:Alert awake-not in any distress HEENT:atraumatic, normocephalic Chest: B/L clear to auscultation anteriorly CVS:S1S2 regular Abdomen:soft non tender, non distended Extremities:no edema Neurology: Non focal Skin: no rash   Data Review:    CBC Recent Labs  Lab 02/26/19 1314 02/27/19 0345 02/28/19 0115 03/01/19 0322  WBC 3.9* 3.5* 2.3* 5.9  HGB 14.8 12.8 12.2 12.8  HCT 44.6 39.2 38.0 39.4  PLT 255 205 179 199  MCV 93.3 94.9 96.0 94.0  MCH 31.0 31.0 30.8 30.5  MCHC 33.2 32.7 32.1 32.5  RDW 12.7 13.0 12.9 12.9  LYMPHSABS  --  1.1 0.8 1.0  MONOABS  --  0.3 0.1 0.3  EOSABS  --  0.0 0.0 0.0  BASOSABS  --  0.0 0.0 0.0    Chemistries  Recent Labs  Lab 02/26/19 1314 02/27/19 0345 02/28/19 0115 03/01/19 0322   NA 135 138 137 138  K 3.2* 3.2* 4.3 4.0  CL 102 107 107 105  CO2 20* 21* 22 25  GLUCOSE 92 89 104* 146*  BUN 15 14 14 16   CREATININE 0.75 0.60 0.60 0.57  CALCIUM 8.7* 7.9* 8.2* 8.6*  MG  --  2.1 1.9 1.9  AST  --  31 40 46*  ALT  --  27 32 51*  ALKPHOS  --  65 65 71  BILITOT  --  0.2* 0.3 0.2*   ------------------------------------------------------------------------------------------------------------------ No results for input(s): CHOL, HDL, LDLCALC, TRIG, CHOLHDL, LDLDIRECT in the last 72 hours.  No results found for: HGBA1C ------------------------------------------------------------------------------------------------------------------ No results for input(s): TSH, T4TOTAL, T3FREE, THYROIDAB in the last 72 hours.  Invalid input(s): FREET3 ------------------------------------------------------------------------------------------------------------------ Recent Labs    02/28/19 0115 03/01/19 0322  FERRITIN 536* 745*    Coagulation profile No results for input(s): INR, PROTIME in the last 168 hours.  Recent Labs    02/28/19 0115 03/01/19 0322  DDIMER 1.44* 1.34*    Cardiac Enzymes No results for input(s): CKMB, TROPONINI, MYOGLOBIN in the last 168 hours.  Invalid input(s): CK ------------------------------------------------------------------------------------------------------------------ No results found for: BNP  Micro Results Recent Results (from the past 240 hour(s))  SARS Coronavirus 2 [facility] order, Performed in [facility] hospital lab) Nasopharyngeal Nasopharyngeal Swab     Status: Abnormal   Collection Time: 02/26/19  2:22 PM   Specimen: Nasopharyngeal Swab  Result Value Ref Range Status   SARS Coronavirus 2 POSITIVE (A) NEGATIVE Final    Comment: RESULT CALLED TO, READ BACK BY AND VERIFIED WITH: Sheilah Pigeon RN 15:30 02/26/19 ([email or web address]) (NOTE) If result is NEGATIVE SARS-CoV-2 target nucleic acids are NOT DETECTED. The SARS-CoV-2 RNA is generally  detectable in upper and lower  respiratory specimens during the acute phase of infection. The lowest  concentration of SARS-CoV-2 viral copies this assay can detect is 250  copies / mL. A negative result does not preclude SARS-CoV-2 infection  and should not be used as the sole basis for treatment or other  patient management decisions.  A negative result may occur with  improper specimen collection / handling, submission of specimen other  than nasopharyngeal swab, presence of viral mutation(s) within the  areas targeted by this assay, and inadequate number of viral copies  (<250 copies / mL). A negative result must be combined with clinical  observations, patient history, and epidemiological information. If result is POSITIVE SARS-CoV-2 target nucleic acids are DETECTED.  The SARS-CoV-2 RNA is generally detectable in upper and lower  respiratory specimens during the acute phase of infection.  Positive  results are indicative of active infection with SARS-CoV-2.  Clinical  correlation with patient history and other diagnostic information is  necessary to determine patient infection status.  Positive results do  not rule out bacterial infection or co-infection with other viruses. If result is PRESUMPTIVE POSTIVE SARS-CoV-2 nucleic acids MAY BE PRESENT.   A presumptive positive result was obtained on the submitted specimen  and confirmed on repeat testing.  While 2019  novel coronavirus  (SARS-CoV-2) nucleic acids may be present in the submitted sample  additional confirmatory testing may be necessary for epidemiological  and / or clinical management purposes  to differentiate between  SARS-CoV-2 and other Sarbecovirus currently known to infect humans.  If clinically indicated additional testing with an alternate test  methodology [phone]) i s advised. The SARS-CoV-2 RNA is generally  detectable in upper and lower respiratory specimens during the acute  phase of infection. The  expected result is Negative. Fact Sheet for Patients:  StrictlyIdeas.no Fact Sheet for Healthcare Providers: BankingDealers.co.za This test is not yet approved or cleared by the [place] FDA and has been authorized for detection and/or diagnosis of SARS-CoV-2 by FDA under an Emergency Use Authorization (EUA).  This EUA will remain in effect (meaning this test can be used) for the duration of the COVID-19 declaration under Section 564(b)(1) of the Act, 21 U.S.C. section 360bbb-3(b)(1), unless the authorization is terminated or revoked sooner. Performed at [facility], [place] [address], [place], [place] [address]     Radiology Reports Dg Chest Portable 1 View  Result Date: 02/26/2019 CLINICAL DATA:  Weakness with syncope EXAM: PORTABLE CHEST 1 VIEW COMPARISON:  October 02, 2008 FINDINGS: There is slight bibasilar atelectasis. There is no edema or consolidation. Heart size and pulmonary vascularity are normal. No adenopathy. There is aortic atherosclerosis. There is postoperative change in the lower cervical region. IMPRESSION: Bibasilar atelectasis. No edema or consolidation. Heart size within normal limits. Aortic Atherosclerosis (ICD10-I70.0). Electronically Signed   By: Lowella Grip III M.D.   On: 02/26/2019 15:35

## 2019-03-02 LAB — CBC WITH DIFFERENTIAL/PLATELET
Abs Immature Granulocytes: 0.04 10*3/uL (ref 0.00–0.07)
Basophils Absolute: 0 10*3/uL (ref 0.0–0.1)
Basophils Relative: 0 %
Eosinophils Absolute: 0 10*3/uL (ref 0.0–0.5)
Eosinophils Relative: 0 %
HCT: 40.3 % (ref 36.0–46.0)
Hemoglobin: 13.3 g/dL (ref 12.0–15.0)
Immature Granulocytes: 1 %
Lymphocytes Relative: 23 %
Lymphs Abs: 1.1 10*3/uL (ref 0.7–4.0)
MCH: 30.6 pg (ref 26.0–34.0)
MCHC: 33 g/dL (ref 30.0–36.0)
MCV: 92.9 fL (ref 80.0–100.0)
Monocytes Absolute: 0.3 10*3/uL (ref 0.1–1.0)
Monocytes Relative: 6 %
Neutro Abs: 3.4 10*3/uL (ref 1.7–7.7)
Neutrophils Relative %: 70 %
Platelets: 211 10*3/uL (ref 150–400)
RBC: 4.34 MIL/uL (ref 3.87–5.11)
RDW: 12.9 % (ref 11.5–15.5)
WBC: 4.9 10*3/uL (ref 4.0–10.5)
nRBC: 0 % (ref 0.0–0.2)

## 2019-03-02 LAB — COMPREHENSIVE METABOLIC PANEL
ALT: 131 U/L — ABNORMAL HIGH (ref 0–44)
AST: 95 U/L — ABNORMAL HIGH (ref 15–41)
Albumin: 3.2 g/dL — ABNORMAL LOW (ref 3.5–5.0)
Alkaline Phosphatase: 83 U/L (ref 38–126)
Anion gap: 8 (ref 5–15)
BUN: 17 mg/dL (ref 8–23)
CO2: 26 mmol/L (ref 22–32)
Calcium: 8.7 mg/dL — ABNORMAL LOW (ref 8.9–10.3)
Chloride: 105 mmol/L (ref 98–111)
Creatinine, Ser: 0.55 mg/dL (ref 0.44–1.00)
GFR calc Af Amer: >60 mL/min (ref >60)
GFR calc non Af Amer: >60 mL/min (ref >60)
Glucose, Bld: 147 mg/dL — ABNORMAL HIGH (ref 70–99)
Potassium: 3.8 mmol/L (ref 3.5–5.1)
Sodium: 139 mmol/L (ref 135–145)
Total Bilirubin: 0.4 mg/dL (ref 0.3–1.2)
Total Protein: 6.6 g/dL (ref 6.5–8.1)

## 2019-03-02 LAB — C-REACTIVE PROTEIN: CRP: 1.2 mg/dL — ABNORMAL HIGH (ref <1.0)

## 2019-03-02 LAB — D-DIMER, QUANTITATIVE: D-Dimer, Quant: 1.12 ug/mL-FEU — ABNORMAL HIGH (ref 0.00–0.50)

## 2019-03-02 LAB — MAGNESIUM: Magnesium: 2 mg/dL (ref 1.7–2.4)

## 2019-03-02 LAB — FERRITIN: Ferritin: 799 ng/mL — ABNORMAL HIGH (ref 11–307)

## 2019-03-02 MED ORDER — METHYLPREDNISOLONE SODIUM SUCC 40 MG IJ SOLR
40.0000 mg | Freq: Every day | INTRAMUSCULAR | Status: DC
  Administered 2019-03-03: 40 mg via INTRAVENOUS
  Filled 2019-03-02: qty 1

## 2019-03-02 NOTE — Progress Notes (Signed)
PROGRESS NOTE                                                                                                                                                                                                             Patient Demographics:    Maria [name], is a 63 y.o. female, DOB - 02/23/1956, XVQ:[id number]  Outpatient Primary MD for the patient is Chelsea Primus, MD   Admit date - 02/26/2019   LOS - 3  Chief Complaint  Patient presents with  . Loss of Consciousness  . Chest Pain       Brief Narrative: Patient is a 63 y.o. female with PMHx of COPD, remote history of PE approximately 15 years back, remote history of uterine cancer s/p hysterectomy-presenting with 3-day history of nausea, diarrhea, cough and shortness of breath along with 2 episodes of syncope.  She was found to have orthostatic hypotension.  She was subsequently admitted to the hospitalist service.    Subjective:   Patient in bed, appears comfortable, denies any headache, no fever, no chest pain or pressure, no shortness of breath , no abdominal pain. No focal weakness. Not dizzy.   Assessment  & Plan :   Acute Hypoxic Resp Failure due to Covid 19 Viral pneumonia: Improved-continue Solu-Medrol and Remdesivir.  Fever: Afebrile  O2 requirements: Remains on room air (on 8/12-developed hypoxemia today mid 80s range and required oxygen)  COVID-19 Labs: Recent Labs    02/28/19 0115 03/01/19 0322 03/02/19 0155  DDIMER 1.44* 1.34* 1.12*  FERRITIN 536* 745* 799*  CRP 1.5* 0.8 1.2*    COVID-19 Medications: Steroids:Decardron 8/11>>8/12, Solumedrol 8/12>> Remdesivir:8/12>> Actemra:Not indicated Convalescent Plasma:N/A N/A Research Studies:  Other medications: Diuretics:no indication Antibiotics:no indication  Diarrhea: Secondary to COVID-19-has essentially resolved.    Syncope: History very suggestive of orthostatic hypotension due to  diarrhea-symptomatology has improved today.  Orthostatic vital signs are now negative.  Telemetry unremarkable.    HTN: BP controlled-but slowly creeping up-continue to hold antihypertensive and resume in the next few days    COPD: No signs of exacerbation-continue bronchodilators  Chronic left hip pain: Has been having this pain for the past 2 months-walks with the help of walker-follows with orthopedics at [facility]-orthopedics arranging for outpatient MRI per patient/family.  Continue supportive care.  Remote history of pulmonary embolism (> 15years back): No longer on anticoagulation-on  prophylactic anticoagulation with Lovenox  Remote history of uterine cancer s/p hysterectomy (> 30 years back)  ABG: No results found for: PHART, PCO2ART, PO2ART, HCO3, TCO2, ACIDBASEDEF, O2SAT  Vent Settings:N/A  Condition - Stable  Family Communication  :  Spouse updated over the phone 8/14  Code Status :  Full Code  Diet :  Diet Order            Diet Heart Room service appropriate? Yes; Fluid consistency: Thin  Diet effective now               Disposition Plan  : Discharge on 03/03/2019 after she finishes her Remdisvir course.  Consults  :  None  Procedures  :  None  DVT Prophylaxis  :  Lovenox  Lab Results  Component Value Date   PLT 211 03/02/2019    Antibiotics  :    Anti-infectives (From admission, onward)   Start     Dose/Rate Route Frequency Ordered Stop   02/28/19 1300  remdesivir 100 mg in sodium chloride 0.9 % 250 mL IVPB     100 mg 500 mL/hr over 30 Minutes Intravenous Every 24 hours 02/27/19 1203 03/04/19 1259   02/27/19 1300  remdesivir 200 mg in sodium chloride 0.9 % 250 mL IVPB     200 mg 500 mL/hr over 30 Minutes Intravenous Once 02/27/19 1203 02/27/19 1357      Inpatient Medications  Scheduled Meds: . benzonatate  200 mg Oral TID  . enoxaparin (LOVENOX) injection  40 mg Subcutaneous Q24H  . fluticasone furoate-vilanterol  1 puff Inhalation Daily   And  .  umeclidinium bromide  1 puff Inhalation Daily  . [START ON 03/03/2019] methylPREDNISolone (SOLU-MEDROL) injection  40 mg Intravenous Daily  . nortriptyline  25 mg Oral QHS  . pregabalin  50 mg Oral TID  . senna  1 tablet Oral BID  . sodium chloride flush  3 mL Intravenous Once  . venlafaxine XR  75 mg Oral Daily   Continuous Infusions: . remdesivir 100 mg in NS 250 mL 100 mg (03/01/19 1215)   PRN Meds:.acetaminophen, albuterol, chlorpheniramine-HYDROcodone, traMADol   Time Spent in minutes  25   See all Orders from today for further details   Lala Lund M.D on 03/02/2019 at 11:06 AM  To page go to [email or web address] - use universal password  [facility] -  Office  [phone]    Objective:   Vitals:   03/01/19 1900 03/02/19 0448 03/02/19 0700 03/02/19 0800  BP: 114/67 128/67 120/62   Pulse: 64 (!) 57 (!) 55 (!) 55  Resp: 17 (!) 21 20 19   Temp: 97.9 F (36.6 C) 98.1 F (36.7 C) 98.1 F (36.7 C)   TempSrc: Oral Oral Oral   SpO2: 96% 93% 91% 92%  Weight:      Height:        Wt Readings from Last 3 Encounters:  02/26/19 82.6 kg     Intake/Output Summary (Last 24 hours) at 03/02/2019 1106 Last data filed at 03/01/2019 1500 Gross per 24 hour  Intake -  Output 350 ml  Net -350 ml   Physical Exam  Awake Alert,   No new F.N deficits, Normal affect [place].AT,PERRAL Supple Neck,No JVD, No cervical lymphadenopathy appriciated.  Symmetrical Chest wall movement, Good air movement bilaterally, CTAB RRR,No Gallops, Rubs or new Murmurs, No Parasternal Heave +ve B.Sounds, Abd Soft, No tenderness, No organomegaly appriciated, No rebound - guarding or rigidity. No Cyanosis, Clubbing or edema, No new Rash or  bruise    Data Review:    CBC Recent Labs  Lab 02/26/19 1314 02/27/19 0345 02/28/19 0115 03/01/19 0322 03/02/19 0155  WBC 3.9* 3.5* 2.3* 5.9 4.9  HGB 14.8 12.8 12.2 12.8 13.3  HCT 44.6 39.2 38.0 39.4 40.3  PLT 255 205 179 199 211  MCV 93.3 94.9 96.0 94.0  92.9  MCH 31.0 31.0 30.8 30.5 30.6  MCHC 33.2 32.7 32.1 32.5 33.0  RDW 12.7 13.0 12.9 12.9 12.9  LYMPHSABS  --  1.1 0.8 1.0 1.1  MONOABS  --  0.3 0.1 0.3 0.3  EOSABS  --  0.0 0.0 0.0 0.0  BASOSABS  --  0.0 0.0 0.0 0.0    Chemistries  Recent Labs  Lab 02/26/19 1314 02/27/19 0345 02/28/19 0115 03/01/19 0322 03/02/19 0155  NA 135 138 137 138 139  K 3.2* 3.2* 4.3 4.0 3.8  CL 102 107 107 105 105  CO2 20* 21* 22 25 26   GLUCOSE 92 89 104* 146* 147*  BUN 15 14 14 16 17   CREATININE 0.75 0.60 0.60 0.57 0.55  CALCIUM 8.7* 7.9* 8.2* 8.6* 8.7*  MG  --  2.1 1.9 1.9 2.0  AST  --  31 40 46* 95*  ALT  --  27 32 51* 131*  ALKPHOS  --  65 65 71 83  BILITOT  --  0.2* 0.3 0.2* 0.4   ------------------------------------------------------------------------------------------------------------------ No results for input(s): CHOL, HDL, LDLCALC, TRIG, CHOLHDL, LDLDIRECT in the last 72 hours.  No results found for: HGBA1C ------------------------------------------------------------------------------------------------------------------ No results for input(s): TSH, T4TOTAL, T3FREE, THYROIDAB in the last 72 hours.  Invalid input(s): FREET3 ------------------------------------------------------------------------------------------------------------------ Recent Labs    03/01/19 0322 03/02/19 0155  FERRITIN 745* 799*    Coagulation profile No results for input(s): INR, PROTIME in the last 168 hours.  Recent Labs    03/01/19 0322 03/02/19 0155  DDIMER 1.34* 1.12*    Cardiac Enzymes No results for input(s): CKMB, TROPONINI, MYOGLOBIN in the last 168 hours.  Invalid input(s): CK ------------------------------------------------------------------------------------------------------------------ No results found for: BNP  Micro Results Recent Results (from the past 240 hour(s))  SARS Coronavirus 2 [facility] order, Performed in [facility] hospital lab) Nasopharyngeal Nasopharyngeal Swab      Status: Abnormal   Collection Time: 02/26/19  2:22 PM   Specimen: Nasopharyngeal Swab  Result Value Ref Range Status   SARS Coronavirus 2 POSITIVE (A) NEGATIVE Final    Comment: RESULT CALLED TO, READ BACK BY AND VERIFIED WITH: Sheilah Pigeon RN 15:30 02/26/19 ([email or web address]) (NOTE) If result is NEGATIVE SARS-CoV-2 target nucleic acids are NOT DETECTED. The SARS-CoV-2 RNA is generally detectable in upper and lower  respiratory specimens during the acute phase of infection. The lowest  concentration of SARS-CoV-2 viral copies this assay can detect is 250  copies / mL. A negative result does not preclude SARS-CoV-2 infection  and should not be used as the sole basis for treatment or other  patient management decisions.  A negative result may occur with  improper specimen collection / handling, submission of specimen other  than nasopharyngeal swab, presence of viral mutation(s) within the  areas targeted by this assay, and inadequate number of viral copies  (<250 copies / mL). A negative result must be combined with clinical  observations, patient history, and epidemiological information. If result is POSITIVE SARS-CoV-2 target nucleic acids are DETECTED.  The SARS-CoV-2 RNA is generally detectable in upper and lower  respiratory specimens during the acute phase of infection.  Positive  results are indicative of  active infection with SARS-CoV-2.  Clinical  correlation with patient history and other diagnostic information is  necessary to determine patient infection status.  Positive results do  not rule out bacterial infection or co-infection with other viruses. If result is PRESUMPTIVE POSTIVE SARS-CoV-2 nucleic acids MAY BE PRESENT.   A presumptive positive result was obtained on the submitted specimen  and confirmed on repeat testing.  While 2019 novel coronavirus  (SARS-CoV-2) nucleic acids may be present in the submitted sample  additional confirmatory testing may be necessary for  epidemiological  and / or clinical management purposes  to differentiate between  SARS-CoV-2 and other Sarbecovirus currently known to infect humans.  If clinically indicated additional testing with an alternate test  methodology [phone]) i s advised. The SARS-CoV-2 RNA is generally  detectable in upper and lower respiratory specimens during the acute  phase of infection. The expected result is Negative. Fact Sheet for Patients:  StrictlyIdeas.no Fact Sheet for Healthcare Providers: BankingDealers.co.za This test is not yet approved or cleared by the [place] FDA and has been authorized for detection and/or diagnosis of SARS-CoV-2 by FDA under an Emergency Use Authorization (EUA).  This EUA will remain in effect (meaning this test can be used) for the duration of the COVID-19 declaration under Section 564(b)(1) of the Act, 21 U.S.C. section 360bbb-3(b)(1), unless the authorization is terminated or revoked sooner. Performed at [facility], [place] [address], [place], [place] [address]     Radiology Reports Dg Chest Portable 1 View  Result Date: 02/26/2019 CLINICAL DATA:  Weakness with syncope EXAM: PORTABLE CHEST 1 VIEW COMPARISON:  October 02, 2008 FINDINGS: There is slight bibasilar atelectasis. There is no edema or consolidation. Heart size and pulmonary vascularity are normal. No adenopathy. There is aortic atherosclerosis. There is postoperative change in the lower cervical region. IMPRESSION: Bibasilar atelectasis. No edema or consolidation. Heart size within normal limits. Aortic Atherosclerosis (ICD10-I70.0). Electronically Signed   By: Lowella Grip III M.D.   On: 02/26/2019 15:35

## 2019-03-02 NOTE — Progress Notes (Signed)
Spoke with Spouse questions answered and updated.

## 2019-03-03 LAB — COMPREHENSIVE METABOLIC PANEL
ALT: 139 U/L — ABNORMAL HIGH (ref 0–44)
AST: 67 U/L — ABNORMAL HIGH (ref 15–41)
Albumin: 2.8 g/dL — ABNORMAL LOW (ref 3.5–5.0)
Alkaline Phosphatase: 77 U/L (ref 38–126)
Anion gap: 8 (ref 5–15)
BUN: 20 mg/dL (ref 8–23)
CO2: 25 mmol/L (ref 22–32)
Calcium: 8.7 mg/dL — ABNORMAL LOW (ref 8.9–10.3)
Chloride: 105 mmol/L (ref 98–111)
Creatinine, Ser: 0.52 mg/dL (ref 0.44–1.00)
GFR calc Af Amer: >60 mL/min (ref >60)
GFR calc non Af Amer: >60 mL/min (ref >60)
Glucose, Bld: 164 mg/dL — ABNORMAL HIGH (ref 70–99)
Potassium: 3.6 mmol/L (ref 3.5–5.1)
Sodium: 138 mmol/L (ref 135–145)
Total Bilirubin: 0.3 mg/dL (ref 0.3–1.2)
Total Protein: 5.9 g/dL — ABNORMAL LOW (ref 6.5–8.1)

## 2019-03-03 LAB — CBC WITH DIFFERENTIAL/PLATELET
Abs Immature Granulocytes: 0.07 10*3/uL (ref 0.00–0.07)
Basophils Absolute: 0 10*3/uL (ref 0.0–0.1)
Basophils Relative: 1 %
Eosinophils Absolute: 0 10*3/uL (ref 0.0–0.5)
Eosinophils Relative: 0 %
HCT: 39.4 % (ref 36.0–46.0)
Hemoglobin: 13.3 g/dL (ref 12.0–15.0)
Immature Granulocytes: 1 %
Lymphocytes Relative: 34 %
Lymphs Abs: 2.1 10*3/uL (ref 0.7–4.0)
MCH: 31 pg (ref 26.0–34.0)
MCHC: 33.8 g/dL (ref 30.0–36.0)
MCV: 91.8 fL (ref 80.0–100.0)
Monocytes Absolute: 0.7 10*3/uL (ref 0.1–1.0)
Monocytes Relative: 11 %
Neutro Abs: 3.2 10*3/uL (ref 1.7–7.7)
Neutrophils Relative %: 53 %
Platelets: 229 10*3/uL (ref 150–400)
RBC: 4.29 MIL/uL (ref 3.87–5.11)
RDW: 12.8 % (ref 11.5–15.5)
WBC: 6.1 10*3/uL (ref 4.0–10.5)
nRBC: 0 % (ref 0.0–0.2)

## 2019-03-03 LAB — FERRITIN: Ferritin: 794 ng/mL — ABNORMAL HIGH (ref 11–307)

## 2019-03-03 LAB — MAGNESIUM: Magnesium: 2 mg/dL (ref 1.7–2.4)

## 2019-03-03 LAB — D-DIMER, QUANTITATIVE: D-Dimer, Quant: 1.28 ug/mL-FEU — ABNORMAL HIGH (ref 0.00–0.50)

## 2019-03-03 LAB — C-REACTIVE PROTEIN: CRP: 1.3 mg/dL — ABNORMAL HIGH (ref <1.0)

## 2019-03-03 MED ORDER — PREDNISONE 5 MG PO TABS: ORAL_TABLET | ORAL | 0 refills | Status: DC

## 2019-03-03 NOTE — TOC Initial Note (Signed)
Transition of Care [facility]) - Initial/Assessment Note    Patient Details  Name: Maria [name] MRN: [id number] Date of Birth: [birth year]  Transition of Care [facility]) CM/SW Contact:    Erenest Rasher, RN Phone Number: [phone] 03/03/2019, 10:44 AM  Clinical Narrative:                 Spoke to pt and offered choice for [facility]. She is agreeable to [facility]. Alvis Lemmings can accept referral. Contacted [facility] to provide her a RW and 3n1 bedside commode to pt's room from [place].   Expected Discharge Plan: [place] Barriers to Discharge: No Barriers Identified   Patient Goals and CMS Choice Patient states their goals for this hospitalization and ongoing recovery are:: get better CMS Medicare.gov Compare Post Acute Care list provided to:: Patient Choice offered to / list presented to : Patient  Expected Discharge Plan and Services Expected Discharge Plan: [place]   Discharge Planning Services: CM Consult Post Acute Care Choice: Home Health, Durable Medical Equipment Living arrangements for the past 2 months: Single Family Home Expected Discharge Date: 03/03/19               DME Arranged: 3-N-1, Gilford Rile rolling DME Agency: [employer] Date DME Agency Contacted: 03/03/19 Time DME Agency Contacted: 53 Representative spoke with at DME Agency: [place] Arranged: RN, PT, OT          Prior Living Arrangements/Services Living arrangements for the past 2 months: [place] with:: Spouse Patient language and need for interpreter reviewed:: Yes Do you feel safe going back to the place where you live?: Yes      Need for Family Participation in Patient Care: Yes (Comment) Care giver support system in place?: Yes (comment)   Criminal Activity/Legal Involvement Pertinent to Current Situation/Hospitalization: No - Comment as needed  Activities of Daily Living Home Assistive Devices/Equipment: Cane (specify quad or straight) ADL Screening  (condition at time of admission) Patient's cognitive ability adequate to safely complete daily activities?: Yes Is the patient deaf or have difficulty hearing?: No Does the patient have difficulty seeing, even when wearing glasses/contacts?: No Does the patient have difficulty concentrating, remembering, or making decisions?: No Patient able to express need for assistance with ADLs?: Yes Does the patient have difficulty dressing or bathing?: Yes Independently performs ADLs?: No Communication: Independent Dressing (OT): Needs assistance Is this a change from baseline?: Pre-admission baseline Grooming: Needs assistance Is this a change from baseline?: Pre-admission baseline Feeding: Independent Bathing: Needs assistance Is this a change from baseline?: Pre-admission baseline Toileting: Needs assistance Is this a change from baseline?: Pre-admission baseline In/Out Bed: Needs assistance Is this a change from baseline?: Pre-admission baseline Walks in Home: Needs assistance Is this a change from baseline?: Pre-admission baseline Does the patient have difficulty walking or climbing stairs?: Yes Weakness of Legs: Both Weakness of Arms/Hands: Both  Permission Sought/Granted Permission sought to share information with : Case Manager Permission granted to share information with : Yes, Verbal Permission Granted  Share Information with NAME: Dreonna Hussein  Permission granted to share info w AGENCY: [place] [facility]  Permission granted to share info w Relationship: husband  Permission granted to share info w Contact Information: [phone]  Emotional Assessment       Orientation: : Oriented to Self, Oriented to Place, Oriented to  Time, Oriented to Situation      Admission diagnosis:  Orthostatic hypotension [I95.1] Syncope and collapse [R55]  Diarrhea of infectious origin [A09] Chronic bronchitis, unspecified chronic bronchitis type ([place]) [J42] COVID-19 virus infection [U07.1] Patient  Active Problem List   Diagnosis Date Noted  . Pneumonia due to COVID-19 virus 02/27/2019  . COVID-19 virus infection 02/26/2019  . COPD (chronic obstructive pulmonary disease) ([place]) 02/26/2019  . Syncope 02/26/2019  . Orthostatic hypotension 02/26/2019   PCP:  Chelsea Primus, MD Pharmacy:   [facility] [address], [place] - [place] [address] [place] [address] Phone: [phone] Fax: [phone]     Social Determinants of Health (SDOH) Interventions    Readmission Risk Interventions No flowsheet data found.

## 2019-03-03 NOTE — Discharge Instructions (Signed)
COVID-19 Frequently Asked Questions COVID-19 (coronavirus disease) is an infection that is caused by a large family of viruses. Some viruses cause illness in people and others cause illness in animals like camels, cats, and bats. In some cases, the viruses that cause illness in animals can spread to humans. Where did the coronavirus come from? In December 2019, [place] told the [facility]) of several cases of lung disease (human respiratory illness). These cases were linked to an open seafood and livestock market in the city of [place]. The link to the seafood and livestock market suggests that the virus may have spread from animals to humans. However, since that first outbreak in December, the virus has also been shown to spread from person to person. What is the name of the disease and the virus? Disease name Early on, this disease was called novel coronavirus. This is because scientists determined that the disease was caused by a new (novel) respiratory virus. The World Health Organization [facility]) has now named the disease COVID-19, or coronavirus disease. Virus name The virus that causes the disease is called severe acute respiratory syndrome coronavirus 2 (SARS-CoV-2). More information on disease and virus naming World Health Organization [facility]): www.who.int/emergencies/diseases/novel-coronavirus-2019/technical-guidance/naming-the-coronavirus-disease-(covid-2019)-and-the-virus-that-causes-it Who is at risk for complications from coronavirus disease? Some people may be at higher risk for complications from coronavirus disease. This includes older adults and people who have chronic diseases, such as heart disease, diabetes, and lung disease. If you are at higher risk for complications, take these extra precautions:  Avoid close contact with people who are sick or have a fever or cough. Stay at least 3-6 ft (1-2 m) away from them, if possible.  Wash your hands often with soap and  water for at least 20 seconds.  Avoid touching your face, mouth, nose, or eyes.  Keep supplies on hand at home, such as food, medicine, and cleaning supplies.  Stay home as much as possible.  Avoid social gatherings and travel. How does coronavirus disease spread? The virus that causes coronavirus disease spreads easily from person to person (is contagious). There are also cases of community-spread disease. This means the disease has spread to:  People who have no known contact with other infected people.  People who have not traveled to areas where there are known cases. It appears to spread from one person to another through droplets from coughing or sneezing. Can I get the virus from touching surfaces or objects? There is still a lot that we do not know about the virus that causes coronavirus disease. Scientists are basing a lot of information on what they know about similar viruses, such as:  Viruses cannot generally survive on surfaces for long. They need a human body (host) to survive.  It is more likely that the virus is spread by close contact with people who are sick (direct contact), such as through: ? Shaking hands or hugging. ? Breathing in respiratory droplets that travel through the air. This can happen when an infected person coughs or sneezes on or near other people.  It is less likely that the virus is spread when a person touches a surface or object that has the virus on it (indirect contact). The virus may be able to enter the body if the person touches a surface or object and then touches his or her face, eyes, nose, or mouth. Can a person spread the virus without having symptoms of the disease? It may be possible for the virus to spread before a  person has symptoms of the disease, but this is most likely not the main way the virus is spreading. It is more likely for the virus to spread by being in close contact with people who are sick and breathing in the respiratory  droplets of a sick person's cough or sneeze. What are the symptoms of coronavirus disease? Symptoms vary from person to person and can range from mild to severe. Symptoms may include:  Fever.  Cough.  Tiredness, weakness, or fatigue.  Fast breathing or feeling short of breath. These symptoms can appear anywhere from 2 to 14 days after you have been exposed to the virus. If you develop symptoms, call your health care provider. People with severe symptoms may need hospital care. If I am exposed to the virus, how long does it take before symptoms start? Symptoms of coronavirus disease may appear anywhere from 2 to 14 days after a person has been exposed to the virus. If you develop symptoms, call your health care provider. Should I be tested for this virus? Your health care provider will decide whether to test you based on your symptoms, history of exposure, and your risk factors. How does a health care provider test for this virus? Health care providers will collect samples to send for testing. Samples may include:  Taking a swab of fluid from the nose.  Taking fluid from the lungs by having you cough up mucus (sputum) into a sterile cup.  Taking a blood sample.  Taking a stool or urine sample. Is there a treatment or vaccine for this virus? Currently, there is no vaccine to prevent coronavirus disease. Also, there are no medicines like antibiotics or antivirals to treat the virus. A person who becomes sick is given supportive care, which means rest and fluids. A person may also relieve his or her symptoms by using over-the-counter medicines that treat sneezing, coughing, and runny nose. These are the same medicines that a person takes for the common cold. If you develop symptoms, call your health care provider. People with severe symptoms may need hospital care. What can I do to protect myself and my family from this virus?     You can protect yourself and your family by taking the  same actions that you would take to prevent the spread of other viruses. Take the following actions:  Wash your hands often with soap and water for at least 20 seconds. If soap and water are not available, use alcohol-based hand sanitizer.  Avoid touching your face, mouth, nose, or eyes.  Cough or sneeze into a tissue, sleeve, or elbow. Do not cough or sneeze into your hand or the air. ? If you cough or sneeze into a tissue, throw it away immediately and wash your hands.  Disinfect objects and surfaces that you frequently touch every day.  Avoid close contact with people who are sick or have a fever or cough. Stay at least 3-6 ft (1-2 m) away from them, if possible.  Stay home if you are sick, except to get medical care. Call your health care provider before you get medical care.  Make sure your vaccines are up to date. Ask your health care provider what vaccines you need. What should I do if I need to travel? Follow travel recommendations from your local health authority, the CDC, and WHO. Travel information and advice  Centers for Disease Control and Prevention (CDC): BodyEditor.hu  World Health Organization [facility]): ThirdIncome.ca Know the risks and take action to protect your  health  You are at higher risk of getting coronavirus disease if you are traveling to areas with an outbreak or if you are exposed to travelers from areas with an outbreak.  Wash your hands often and practice good hygiene to lower the risk of catching or spreading the virus. What should I do if I am sick? General instructions to stop the spread of infection  Wash your hands often with soap and water for at least 20 seconds. If soap and water are not available, use alcohol-based hand sanitizer.  Cough or sneeze into a tissue, sleeve, or elbow. Do not cough or sneeze into your hand or the air.  If you cough or  sneeze into a tissue, throw it away immediately and wash your hands.  Stay home unless you must get medical care. Call your health care provider or local health authority before you get medical care.  Avoid public areas. Do not take public transportation, if possible.  If you can, wear a mask if you must go out of the house or if you are in close contact with someone who is not sick. Keep your home clean  Disinfect objects and surfaces that are frequently touched every day. This may include: ? Counters and tables. ? Doorknobs and light switches. ? Sinks and faucets. ? Electronics such as phones, remote controls, keyboards, computers, and tablets.  Wash dishes in hot, soapy water or use a dishwasher. Air-dry your dishes.  Wash laundry in hot water. Prevent infecting other household members  Let healthy household members care for children and pets, if possible. If you have to care for children or pets, wash your hands often and wear a mask.  Sleep in a different bedroom or bed, if possible.  Do not share personal items, such as razors, toothbrushes, deodorant, combs, brushes, towels, and washcloths. Where to find more information Centers for Disease Control and Prevention (CDC)  Information and news updates: https://www.butler-gonzalez.com/ World Health Organization [facility])  Information and news updates: MissExecutive.com.ee  Coronavirus health topic: https://www.castaneda.info/  Questions and answers on COVID-19: OpportunityDebt.at  Global tracker: who.sprinklr.com American Academy of Pediatrics (AAP)  Information for families: www.healthychildren.org/English/health-issues/conditions/chest-lungs/Pages/2019-Novel-Coronavirus.aspx The coronavirus situation is changing rapidly. Check your local health authority website or the CDC and [facility] websites for updates and news. When should I contact a health care  provider?  Contact your health care provider if you have symptoms of an infection, such as fever or cough, and you: ? Have been near anyone who is known to have coronavirus disease. ? Have come into contact with a person who is suspected to have coronavirus disease. ? Have traveled outside of the country. When should I get emergency medical care?  Get help right away by calling your local emergency services (911 in the [place] if you have: ? Trouble breathing. ? Pain or pressure in your chest. ? Confusion. ? Blue-tinged lips and fingernails. ? Difficulty waking from sleep. ? Symptoms that get worse. Let the emergency medical personnel know if you think you have coronavirus disease. Summary  A new respiratory virus is spreading from person to person and causing COVID-19 (coronavirus disease).  The virus that causes COVID-19 appears to spread easily. It spreads from one person to another through droplets from coughing or sneezing.  Older adults and those with chronic diseases are at higher risk of disease. If you are at higher risk for complications, take extra precautions.  There is currently no vaccine to prevent coronavirus disease. There are no medicines, such as  antibiotics or antivirals, to treat the virus.  You can protect yourself and your family by washing your hands often, avoiding touching your face, and covering your coughs and sneezes. This information is not intended to replace advice given to you by your health care provider. Make sure you discuss any questions you have with your health care provider. Document Released: 10/30/2018 Document Revised: 10/30/2018 Document Reviewed: 10/30/2018 Elsevier Patient Education  2020 [place].  COVID-19: How to Protect Yourself and Others Know how it spreads  There is currently no vaccine to prevent coronavirus disease 2019 (COVID-19).  The best way to prevent illness is to avoid being exposed to this virus.  The virus is  thought to spread mainly from person-to-person. ? Between people who are in close contact with one another (within about 6 feet). ? Through respiratory droplets produced when an infected person coughs, sneezes or talks. ? These droplets can land in the mouths or noses of people who are nearby or possibly be inhaled into the lungs. ? Some recent studies have suggested that COVID-19 may be spread by people who are not showing symptoms. Everyone should Clean your hands often  Wash your hands often with soap and water for at least 20 seconds especially after you have been in a public place, or after blowing your nose, coughing, or sneezing.  If soap and water are not readily available, use a hand sanitizer that contains at least 60% alcohol. Cover all surfaces of your hands and rub them together until they feel dry.  Avoid touching your eyes, nose, and mouth with unwashed hands. Avoid close contact  Stay home if you are sick.  Avoid close contact with people who are sick.  Put distance between yourself and other people. ? Remember that some people without symptoms may be able to spread virus. ? This is especially important for people who are at higher risk of getting very GainPain.com.cy Cover your mouth and nose with a cloth face cover when around others  You could spread COVID-19 to others even if you do not feel sick.  Everyone should wear a cloth face cover when they have to go out in public, for example to the grocery store or to pick up other necessities. ? Cloth face coverings should not be placed on young children under age 32, anyone who has trouble breathing, or is unconscious, incapacitated or otherwise unable to remove the mask without assistance.  The cloth face cover is meant to protect other people in case you are infected.  Do NOT use a facemask meant for a Dietitian.  Continue to keep about 6  feet between yourself and others. The cloth face cover is not a substitute for social distancing. Cover coughs and sneezes  If you are in a private setting and do not have on your cloth face covering, remember to always cover your mouth and nose with a tissue when you cough or sneeze or use the inside of your elbow.  Throw used tissues in the trash.  Immediately wash your hands with soap and water for at least 20 seconds. If soap and water are not readily available, clean your hands with a hand sanitizer that contains at least 60% alcohol. Clean and disinfect  Clean AND disinfect frequently touched surfaces daily. This includes tables, doorknobs, light switches, countertops, handles, desks, phones, keyboards, toilets, faucets, and sinks. RackRewards.fr  If surfaces are dirty, clean them: Use detergent or soap and water prior to disinfection.  Then, use a  household disinfectant. You can see a list of EPA-registered household disinfectants here. michellinders.com 11/20/2018 This information is not intended to replace advice given to you by your health care provider. Make sure you discuss any questions you have with your health care provider. Document Released: 10/30/2018 Document Revised: 11/28/2018 Document Reviewed: 10/30/2018 Elsevier Patient Education  2020 [place]. Follow with Primary MD Chelsea Primus, MD in 7 days   Get CBC, CMP, 2 view Chest X ray -  checked next visit within 1 week by Primary MD   Activity: As tolerated with Full fall precautions use walker/cane & assistance as needed  Disposition Home    Diet: Heart Healthy    Special Instructions: If you have smoked or chewed Tobacco  in the last 2 yrs please stop smoking, stop any regular Alcohol  and or any Recreational drug use.  On your next visit with your primary care physician please Get Medicines reviewed and adjusted.  Please request your  Prim.MD to go over all Hospital Tests and Procedure/Radiological results at the follow up, please get all Hospital records sent to your Prim MD by signing hospital release before you go home.  If you experience worsening of your admission symptoms, develop shortness of breath, life threatening emergency, suicidal or homicidal thoughts you must seek medical attention immediately by calling 911 or calling your MD immediately  if symptoms less severe.  You Must read complete instructions/literature along with all the possible adverse reactions/side effects for all the Medicines you take and that have been prescribed to you. Take any new Medicines after you have completely understood and accpet all the possible adverse reactions/side effects.      Person Under Monitoring Name: Maria [name]  Location: [place] [address]   Infection Prevention Recommendations for Individuals Confirmed to have, or Being Evaluated for, 2019 Novel Coronavirus (COVID-19) Infection Who Receive Care at Home  Individuals who are confirmed to have, or are being evaluated for, COVID-19 should follow the prevention steps below until a healthcare provider or local or state health department says they can return to normal activities.  Stay home except to get medical care You should restrict activities outside your home, except for getting medical care. Do not go to work, school, or public areas, and do not use public transportation or taxis.  Call ahead before visiting your doctor Before your medical appointment, call the healthcare provider and tell them that you have, or are being evaluated for, COVID-19 infection. This will help the healthcare providers office take steps to keep other people from getting infected. Ask your healthcare provider to call the local or state health department.  Monitor your symptoms Seek prompt medical attention if your illness is worsening (e.g., difficulty  breathing). Before going to your medical appointment, call the healthcare provider and tell them that you have, or are being evaluated for, COVID-19 infection. Ask your healthcare provider to call the local or state health department.  Wear a facemask You should wear a facemask that covers your nose and mouth when you are in the same room with other people and when you visit a healthcare provider. People who live with or visit you should also wear a facemask while they are in the same room with you.  Separate yourself from other people in your home As much as possible, you should stay in a different room from other people in your home. Also, you should use a separate bathroom, if available.  Avoid sharing household items  You should not share dishes, drinking glasses, cups, eating utensils, towels, bedding, or other items with other people in your home. After using these items, you should wash them thoroughly with soap and water.  Cover your coughs and sneezes Cover your mouth and nose with a tissue when you cough or sneeze, or you can cough or sneeze into your sleeve. Throw used tissues in a lined trash can, and immediately wash your hands with soap and water for at least 20 seconds or use an alcohol-based hand rub.  Wash your [facility] your hands often and thoroughly with soap and water for at least 20 seconds. You can use an alcohol-based hand sanitizer if soap and water are not available and if your hands are not visibly dirty. Avoid touching your eyes, nose, and mouth with unwashed hands.   Prevention Steps for Caregivers and Household Members of Individuals Confirmed to have, or Being Evaluated for, COVID-19 Infection Being Cared for in the Home  If you live with, or provide care at home for, a person confirmed to have, or being evaluated for, COVID-19 infection please follow these guidelines to prevent infection:  Follow healthcare providers instructions Make sure that you  understand and can help the patient follow any healthcare provider instructions for all care.  Provide for the patients basic needs You should help the patient with basic needs in the home and provide support for getting groceries, prescriptions, and other personal needs.  Monitor the patients symptoms If they are getting sicker, call his or her medical provider and tell them that the patient has, or is being evaluated for, COVID-19 infection. This will help the healthcare providers office take steps to keep other people from getting infected. Ask the healthcare provider to call the local or state health department.  Limit the number of people who have contact with the patient  If possible, have only one caregiver for the patient.  Other household members should stay in another home or place of residence. If this is not possible, they should stay  in another room, or be separated from the patient as much as possible. Use a separate bathroom, if available.  Restrict visitors who do not have an essential need to be in the home.  Keep older adults, very young children, and other sick people away from the patient Keep older adults, very young children, and those who have compromised immune systems or chronic health conditions away from the patient. This includes people with chronic heart, lung, or kidney conditions, diabetes, and cancer.  Ensure good ventilation Make sure that shared spaces in the home have good air flow, such as from an air conditioner or an opened window, weather permitting.  Wash your hands often  Wash your hands often and thoroughly with soap and water for at least 20 seconds. You can use an alcohol based hand sanitizer if soap and water are not available and if your hands are not visibly dirty.  Avoid touching your eyes, nose, and mouth with unwashed hands.  Use disposable paper towels to dry your hands. If not available, use dedicated cloth towels and replace  them when they become wet.  Wear a facemask and gloves  Wear a disposable facemask at all times in the room and gloves when you touch or have contact with the patients blood, body fluids, and/or secretions or excretions, such as sweat, saliva, sputum, nasal mucus, vomit, urine, or feces.  Ensure the mask fits over your nose and mouth tightly, and do  not touch it during use.  Throw out disposable facemasks and gloves after using them. Do not reuse.  Wash your hands immediately after removing your facemask and gloves.  If your personal clothing becomes contaminated, carefully remove clothing and launder. Wash your hands after handling contaminated clothing.  Place all used disposable facemasks, gloves, and other waste in a lined container before disposing them with other household waste.  Remove gloves and wash your hands immediately after handling these items.  Do not share dishes, glasses, or other household items with the patient  Avoid sharing household items. You should not share dishes, drinking glasses, cups, eating utensils, towels, bedding, or other items with a patient who is confirmed to have, or being evaluated for, COVID-19 infection.  After the person uses these items, you should wash them thoroughly with soap and water.  Wash laundry thoroughly  Immediately remove and wash clothes or bedding that have blood, body fluids, and/or secretions or excretions, such as sweat, saliva, sputum, nasal mucus, vomit, urine, or feces, on them.  Wear gloves when handling laundry from the patient.  Read and follow directions on labels of laundry or clothing items and detergent. In general, wash and dry with the warmest temperatures recommended on the label.  Clean all areas the individual has used often  Clean all touchable surfaces, such as counters, tabletops, doorknobs, bathroom fixtures, toilets, phones, keyboards, tablets, and bedside tables, every day. Also, clean any surfaces that  may have blood, body fluids, and/or secretions or excretions on them.  Wear gloves when cleaning surfaces the patient has come in contact with.  Use a diluted bleach solution (e.g., dilute bleach with 1 part bleach and 10 parts water) or a household disinfectant with a label that says EPA-registered for coronaviruses. To make a bleach solution at home, add 1 tablespoon of bleach to 1 quart (4 cups) of water. For a larger supply, add  cup of bleach to 1 gallon (16 cups) of water.  Read labels of cleaning products and follow recommendations provided on product labels. Labels contain instructions for safe and effective use of the cleaning product including precautions you should take when applying the product, such as wearing gloves or eye protection and making sure you have good ventilation during use of the product.  Remove gloves and wash hands immediately after cleaning.  Monitor yourself for signs and symptoms of illness Caregivers and household members are considered close contacts, should monitor their health, and will be asked to limit movement outside of the home to the extent possible. Follow the monitoring steps for close contacts listed on the symptom monitoring form.   ? If you have additional questions, contact your local health department or call the epidemiologist on call at [phone] (available 24/7). ? This guidance is subject to change. For the most up-to-date guidance from [facility], please refer to their website: [email or web address]

## 2019-03-03 NOTE — Discharge Summary (Signed)
Maria [name] [id number] DOB: [birth year] DOA: 02/26/2019  PCP: Chelsea Primus, MD  Admit date: 02/26/2019  Discharge date: 03/03/2019  Admitted From: Home   Disposition:  Home   Recommendations for Outpatient Follow-up:   Follow up with PCP in 1-2 weeks  PCP Please obtain BMP/CBC, 2 view CXR in 1week,  (see Discharge instructions)   PCP Please follow up on the following pending results:    Home Health: PT,RN   Equipment/Devices: Rolling walker, 3in1  Consultations: None   Discharge Condition: Stable    CODE STATUS: Full    Diet Recommendation: Heart Healthy     Chief Complaint  Patient presents with  . Loss of Consciousness  . Chest Pain     Brief history of present illness from the day of admission and additional interim summary    Patient is a 63 y.o. female with PMHx of COPD, remote history of PE approximately 15 years back, remote history of uterine cancer s/p hysterectomy-presenting with 3-day history of nausea, diarrhea, cough and shortness of breath along with 2 episodes of syncope.  She was found to have orthostatic hypotension.  She was subsequently admitted to the hospitalist service.                                                                  Hospital Course    Acute Hypoxic Resp Failure due to Covid 19 Viral pneumonia: Improved-continue Solu-Medrol and Remdesivir.  Now stable on room air and completely symptom-free, finished her Remdisvir dose today.  Will be placed on a short course of oral steroids and discharged home with PCP follow-up.  Home health PT and RN also ordered.  Diarrhea: Secondary to COVID-19-has essentially resolved.    Syncope: History very suggestive of orthostatic hypotension due to diarrhea-symptomatology has improved today.  Orthostatic vital signs are now  negative.  Telemetry unremarkable.    She is now completely symptom-free.  HTN: BP now stable continue home regimen.   COPD: No signs of exacerbation-continue bronchodilators  Chronic left hip pain: Has been having this pain for the past 2 months-walks with the help of walker-follows with orthopedics at [facility]-orthopedics arranging for outpatient MRI per patient/family.  Continue supportive care.  Remote history of pulmonary embolism (> 15years back): No longer on anticoagulation-on prophylactic anticoagulation with Lovenox  Remote history of uterine cancer s/p hysterectomy (> 30 years back)  Discharge diagnosis     Active Problems:   COVID-19 virus infection   COPD (chronic obstructive pulmonary disease) (HCC)   Syncope   Orthostatic hypotension   Pneumonia due to COVID-19 virus    Discharge instructions    Discharge Instructions    Diet - low sodium heart healthy   Complete by: As directed    Discharge instructions   Complete by: As directed  Follow with Primary MD Chelsea Primus, MD in 7 days   Get CBC, CMP, 2 view Chest X ray -  checked next visit within 1 week by Primary MD   Activity: As tolerated with Full fall precautions use walker/cane & assistance as needed  Disposition Home    Diet: Heart Healthy    Special Instructions: If you have smoked or chewed Tobacco  in the last 2 yrs please stop smoking, stop any regular Alcohol  and or any Recreational drug use.  On your next visit with your primary care physician please Get Medicines reviewed and adjusted.  Please request your Prim.MD to go over all Hospital Tests and Procedure/Radiological results at the follow up, please get all Hospital records sent to your Prim MD by signing hospital release before you go home.  If you experience worsening of your admission symptoms, develop shortness of breath, life threatening emergency, suicidal or homicidal thoughts you must seek medical attention immediately by  calling 911 or calling your MD immediately  if symptoms less severe.  You Must read complete instructions/literature along with all the possible adverse reactions/side effects for all the Medicines you take and that have been prescribed to you. Take any new Medicines after you have completely understood and accpet all the possible adverse reactions/side effects.   Increase activity slowly   Complete by: As directed       Discharge Medications   Allergies as of 03/03/2019   No Known Allergies     Medication List    STOP taking these medications   celecoxib 200 MG capsule Commonly known as: CELEBREX     TAKE these medications   amLODipine 5 MG tablet Commonly known as: NORVASC Take 10 mg by mouth daily.   diclofenac sodium 1 % Gel Commonly known as: VOLTAREN Apply 2 g topically 4 (four) times daily as needed (pain).   nortriptyline 25 MG capsule Commonly known as: PAMELOR Take 25 mg by mouth at bedtime.   predniSONE 5 MG tablet Commonly known as: DELTASONE Label  & dispense according to the schedule below. take 8 Pills PO for 3 days, 6 Pills PO for 3 days, 4 Pills PO for 3 days, 2 Pills PO for 3 days, 1 Pills PO for 3 days, 1/2 Pill  PO for 3 days then STOP. Total 65 pills.   pregabalin 50 MG capsule Commonly known as: LYRICA Take 50 mg by mouth 3 (three) times daily.   Trelegy Ellipta 100-62.5-25 MCG/INH Aepb Generic drug: Fluticasone-Umeclidin-Vilant Inhale 1 puff into the lungs daily.   venlafaxine XR 75 MG 24 hr capsule Commonly known as: EFFEXOR-XR Take 75 mg by mouth daily.   Ventolin HFA 108 (90 Base) MCG/ACT inhaler Generic drug: albuterol Inhale 2 puffs into the lungs every 6 (six) hours as needed for shortness of breath.            Durable Medical Equipment  (From admission, onward)         Start     Ordered   03/02/19 1103  For home use only DME Walker rolling  Once    Comments: 5 wheel  Question:  Patient needs a walker to treat with the  following condition  Answer:  Syncope   03/02/19 1103          Follow-up Information    Chelminski, Eddie Dibbles, MD. Schedule an appointment as soon as possible for a visit in 1 week(s).   Specialty: Internal Medicine Contact information: [id number] [facility]  [place] [address] [phone]           Major procedures and Radiology Reports - PLEASE review detailed and final reports thoroughly  -         Dg Chest Portable 1 View  Result Date: 02/26/2019 CLINICAL DATA:  Weakness with syncope EXAM: PORTABLE CHEST 1 VIEW COMPARISON:  October 02, 2008 FINDINGS: There is slight bibasilar atelectasis. There is no edema or consolidation. Heart size and pulmonary vascularity are normal. No adenopathy. There is aortic atherosclerosis. There is postoperative change in the lower cervical region. IMPRESSION: Bibasilar atelectasis. No edema or consolidation. Heart size within normal limits. Aortic Atherosclerosis (ICD10-I70.0). Electronically Signed   By: Lowella Grip III M.D.   On: 02/26/2019 15:35    Micro Results     Recent Results (from the past 240 hour(s))  SARS Coronavirus 2 [facility] order, Performed in [facility] hospital lab) Nasopharyngeal Nasopharyngeal Swab     Status: Abnormal   Collection Time: 02/26/19  2:22 PM   Specimen: Nasopharyngeal Swab  Result Value Ref Range Status   SARS Coronavirus 2 POSITIVE (A) NEGATIVE Final    Comment: RESULT CALLED TO, READ BACK BY AND VERIFIED WITH: Sheilah Pigeon RN 15:30 02/26/19 ([email or web address]) (NOTE) If result is NEGATIVE SARS-CoV-2 target nucleic acids are NOT DETECTED. The SARS-CoV-2 RNA is generally detectable in upper and lower  respiratory specimens during the acute phase of infection. The lowest  concentration of SARS-CoV-2 viral copies this assay can detect is 250  copies / mL. A negative result does not preclude SARS-CoV-2 infection  and should not be used as the sole basis for treatment or other  patient  management decisions.  A negative result may occur with  improper specimen collection / handling, submission of specimen other  than nasopharyngeal swab, presence of viral mutation(s) within the  areas targeted by this assay, and inadequate number of viral copies  (<250 copies / mL). A negative result must be combined with clinical  observations, patient history, and epidemiological information. If result is POSITIVE SARS-CoV-2 target nucleic acids are DETECTED.  The SARS-CoV-2 RNA is generally detectable in upper and lower  respiratory specimens during the acute phase of infection.  Positive  results are indicative of active infection with SARS-CoV-2.  Clinical  correlation with patient history and other diagnostic information is  necessary to determine patient infection status.  Positive results do  not rule out bacterial infection or co-infection with other viruses. If result is PRESUMPTIVE POSTIVE SARS-CoV-2 nucleic acids MAY BE PRESENT.   A presumptive positive result was obtained on the submitted specimen  and confirmed on repeat testing.  While 2019 novel coronavirus  (SARS-CoV-2) nucleic acids may be present in the submitted sample  additional confirmatory testing may be necessary for epidemiological  and / or clinical management purposes  to differentiate between  SARS-CoV-2 and other Sarbecovirus currently known to infect humans.  If clinically indicated additional testing with an alternate test  methodology [phone]) i s advised. The SARS-CoV-2 RNA is generally  detectable in upper and lower respiratory specimens during the acute  phase of infection. The expected result is Negative. Fact Sheet for Patients:  StrictlyIdeas.no Fact Sheet for Healthcare Providers: BankingDealers.co.za This test is not yet approved or cleared by the [place] FDA and has been authorized for detection and/or diagnosis of SARS-CoV-2 by FDA under  an Emergency Use Authorization (EUA).  This EUA will remain in effect (meaning this test can be used) for the duration of the  COVID-19 declaration under Section 564(b)(1) of the Act, 21 U.S.C. section 360bbb-3(b)(1), unless the authorization is terminated or revoked sooner. Performed at [facility], [place] [address], [place], [place] [address]     Today   Subjective    Maria [name] today has no headache,no chest abdominal pain,no new weakness tingling or numbness, feels much better wants to go home today.     Objective   Blood pressure 136/71, pulse (!) 56, temperature 98.5 F (36.9 C), temperature source Oral, resp. rate 20, height 5\' 6"  (1.676 m), weight 82.6 kg, SpO2 92 %.   Intake/Output Summary (Last 24 hours) at 03/03/2019 0901 Last data filed at 03/02/2019 1800 Gross per 24 hour  Intake 730 ml  Output -  Net 730 ml    Exam  Awake Alert, Oriented x 3, No new F.N deficits, Normal affect [place].AT,PERRAL Supple Neck,No JVD, No cervical lymphadenopathy appriciated.  Symmetrical Chest wall movement, Good air movement bilaterally, CTAB RRR,No Gallops,Rubs or new Murmurs, No Parasternal Heave +ve B.Sounds, Abd Soft, Non tender, No organomegaly appriciated, No rebound -guarding or rigidity. No Cyanosis, Clubbing or edema, No new Rash or bruise   Data Review   CBC w Diff:  Lab Results  Component Value Date   WBC 6.1 03/03/2019   HGB 13.3 03/03/2019   HCT 39.4 03/03/2019   PLT 229 03/03/2019   LYMPHOPCT 34 03/03/2019   MONOPCT 11 03/03/2019   EOSPCT 0 03/03/2019   BASOPCT 1 03/03/2019    CMP:  Lab Results  Component Value Date   NA 138 03/03/2019   K 3.6 03/03/2019   CL 105 03/03/2019   CO2 25 03/03/2019   BUN 20 03/03/2019   CREATININE 0.52 03/03/2019   PROT 5.9 (L) 03/03/2019   ALBUMIN 2.8 (L) 03/03/2019   BILITOT 0.3 03/03/2019   ALKPHOS 77 03/03/2019   AST 67 (H) 03/03/2019   ALT 139 (H) 03/03/2019  .   Total Time in preparing paper work, data  evaluation and todays exam - 49 minutes  Lala Lund M.D on 03/03/2019 at 9:01 AM  [facility]   Office  [phone]

## 2019-03-03 NOTE — Progress Notes (Signed)
Pt receiving last dose of remdesivir, d/c education given to pt. BSC and walker delivered to pt for discharge. Pt has no further questions at this time. Husband will arrive for pick up around 12:30

## 2019-03-12 DIAGNOSIS — Z20828 Contact with and (suspected) exposure to other viral communicable diseases: Secondary | ICD-10-CM | POA: Diagnosis not present

## 2019-03-12 DIAGNOSIS — M5416 Radiculopathy, lumbar region: Secondary | ICD-10-CM | POA: Diagnosis not present

## 2019-03-13 ENCOUNTER — Telehealth: Payer: Self-pay

## 2019-03-13 NOTE — Telephone Encounter (Signed)
Left message for patient to call back  

## 2019-03-13 NOTE — Telephone Encounter (Signed)
Called spoke with patient. She states she wants to be seen for her COPD. Patient was just in [place] from 8/11-8/16 for a positive Covid screen on 8/11. Patient states her symptoms started on then evening of 8/10 with diarrhea.  Per our protocol for seeing patient who were hospitalized for covid 19 <21 of onset should be isolated or video visit. Patient would be a consult. I asked patient if she feels from a health standpoint could she wait until 03/19/19 which would be 21 days from onset of symptoms. She wasn't sure.   Patient is having SOB with exertion states her sats drop from 85-89% but sats sitting still are 92-95%. She states she went to her PCP and they only checked her sitting and stated she was fine didn't send with O2. Patient also states her lungs burn when she takes a deep breath and she is sleeping sitting up because she cant when she lays down.   SG please advise if patient needs to be seen prior to 03/19/19 and we wear all PPE or what other options are for the patient.

## 2019-03-13 NOTE — Telephone Encounter (Signed)
She needs to be seen with a video visit first, and then we can determine if she needs to be seen in the office. Does she have an oxygen saturation monitor? If she does and she can walk while on the video call we can qualify her for oxygen with ambulation if she drops below 88%. We can determine if she needs a CXR during that virtual visit. Remember to tell her that if she develops worsening shortness of breath that does not resolve with sitting down, she needs to seek emergency care. Thanks

## 2019-03-14 NOTE — Telephone Encounter (Signed)
Spoke with patient. She stated that the first day of symptoms started on August 10th with diarrhea. Then she went downhill after August 10th.

## 2019-03-14 NOTE — Telephone Encounter (Signed)
No need for another covid test but 03/19/2019 puts her exactly at 21 days from admission (not > 21 days) . I Think her symptoms started some days before the admit. Could you pleas ask her onset of first symptom so we are all comfortable that 03/19/2019 is > 21 days since first symptom

## 2019-03-14 NOTE — Telephone Encounter (Signed)
Spoke with the pt and notified ok to keep appt date and time

## 2019-03-14 NOTE — Telephone Encounter (Signed)
Spoke with Maria [name], Supervisor, and was advised the pt is unable to do a video visit as a consult. Called and spoke to pt. Informed her of the inability to do a video visit but we can go ahead and schedule a consult appt for 9/1. This has been scheduled with MR for 03/19/2019 at 0900. Pt aware. Pt is currently waiting for her pulse/ox to be delivered. Pt aware to seek emergency care if she were to develop sudden increase in SOB, CP, fever, swelling.   MR please advise if you would like to have another COVID test prior to pt's appt with you on 03/19/2019. Thanks.

## 2019-03-14 NOTE — Telephone Encounter (Signed)
Ok then thanks. 03/19/2019 will put her at 22 days. So good to come to office

## 2019-03-18 ENCOUNTER — Telehealth: Payer: Self-pay | Admitting: Internal Medicine

## 2019-03-18 NOTE — Telephone Encounter (Signed)
See 8/26 phone note.  Also re-verified with MR who advised again that pt is ok to keep her 9/1 consult appt.   Spoke with pt, aware of recs.  Nothing further needed at this time- will close encounter.

## 2019-03-19 ENCOUNTER — Encounter: Payer: Self-pay | Admitting: Internal Medicine

## 2019-03-19 ENCOUNTER — Other Ambulatory Visit: Payer: Self-pay

## 2019-03-19 ENCOUNTER — Ambulatory Visit: Payer: BC Managed Care – PPO | Admitting: Internal Medicine

## 2019-03-19 ENCOUNTER — Other Ambulatory Visit: Payer: Self-pay | Admitting: General Surgery

## 2019-03-19 DIAGNOSIS — G933 Postviral fatigue syndrome: Secondary | ICD-10-CM | POA: Diagnosis not present

## 2019-03-19 DIAGNOSIS — Z8709 Personal history of other diseases of the respiratory system: Secondary | ICD-10-CM

## 2019-03-19 DIAGNOSIS — R0600 Dyspnea, unspecified: Secondary | ICD-10-CM

## 2019-03-19 DIAGNOSIS — Z8619 Personal history of other infectious and parasitic diseases: Secondary | ICD-10-CM

## 2019-03-19 DIAGNOSIS — E559 Vitamin D deficiency, unspecified: Secondary | ICD-10-CM

## 2019-03-19 DIAGNOSIS — R0602 Shortness of breath: Secondary | ICD-10-CM

## 2019-03-19 DIAGNOSIS — Z87891 Personal history of nicotine dependence: Secondary | ICD-10-CM

## 2019-03-19 DIAGNOSIS — R0902 Hypoxemia: Secondary | ICD-10-CM

## 2019-03-19 DIAGNOSIS — Z8616 Personal history of COVID-19: Secondary | ICD-10-CM

## 2019-03-19 DIAGNOSIS — G9331 Postviral fatigue syndrome: Secondary | ICD-10-CM

## 2019-03-19 HISTORY — DX: Hypoxemia: R09.02

## 2019-03-19 LAB — HEPATIC FUNCTION PANEL
ALT: 20 U/L (ref 0–35)
AST: 14 U/L (ref 0–37)
Albumin: 3.7 g/dL (ref 3.5–5.2)
Alkaline Phosphatase: 73 U/L (ref 39–117)
Bilirubin, Direct: 0.1 mg/dL (ref 0.0–0.3)
Total Bilirubin: 0.5 mg/dL (ref 0.2–1.2)
Total Protein: 6.9 g/dL (ref 6.0–8.3)

## 2019-03-19 LAB — CBC WITH DIFFERENTIAL/PLATELET
Basophils Absolute: 0.1 10*3/uL (ref 0.0–0.1)
Basophils Relative: 0.4 % (ref 0.0–3.0)
Eosinophils Absolute: 0.1 10*3/uL (ref 0.0–0.7)
Eosinophils Relative: 1 % (ref 0.0–5.0)
HCT: 40 % (ref 36.0–46.0)
Hemoglobin: 13.3 g/dL (ref 12.0–15.0)
Lymphocytes Relative: 31 % (ref 12.0–46.0)
Lymphs Abs: 3.7 10*3/uL (ref 0.7–4.0)
MCHC: 33.3 g/dL (ref 30.0–36.0)
MCV: 94 fl (ref 78.0–100.0)
Monocytes Absolute: 0.5 10*3/uL (ref 0.1–1.0)
Monocytes Relative: 4.4 % (ref 3.0–12.0)
Neutro Abs: 7.5 10*3/uL (ref 1.4–7.7)
Neutrophils Relative %: 63.2 % (ref 43.0–77.0)
Platelets: 306 10*3/uL (ref 150.0–400.0)
RBC: 4.26 Mil/uL (ref 3.87–5.11)
RDW: 13.7 % (ref 11.5–15.5)
WBC: 11.9 10*3/uL — ABNORMAL HIGH (ref 4.0–10.5)

## 2019-03-19 LAB — MAGNESIUM: Magnesium: 2 mg/dL (ref 1.5–2.5)

## 2019-03-19 LAB — VITAMIN D 25 HYDROXY (VIT D DEFICIENCY, FRACTURES): VITD: 22.37 ng/mL — ABNORMAL LOW (ref 30.00–100.00)

## 2019-03-19 LAB — BASIC METABOLIC PANEL
BUN: 17 mg/dL (ref 6–23)
CO2: 28 mEq/L (ref 19–32)
Calcium: 9.3 mg/dL (ref 8.4–10.5)
Chloride: 99 mEq/L (ref 96–112)
Creatinine, Ser: 0.8 mg/dL (ref 0.40–1.20)
GFR: 72.66 mL/min (ref >60.00)
Glucose, Bld: 80 mg/dL (ref 70–99)
Potassium: 3.7 mEq/L (ref 3.5–5.1)
Sodium: 138 mEq/L (ref 135–145)

## 2019-03-19 LAB — TSH: TSH: 3.04 u[IU]/mL (ref 0.35–4.50)

## 2019-03-19 LAB — PHOSPHORUS: Phosphorus: 3.4 mg/dL (ref 2.3–4.6)

## 2019-03-19 MED ORDER — IPRATROPIUM-ALBUTEROL 0.5-2.5 (3) MG/3ML IN SOLN: RESPIRATORY_TRACT | 2 refills | Status: DC

## 2019-03-19 MED ORDER — BUDESONIDE 0.25 MG/2ML IN SUSP: 0.2500 mg | Freq: Two times a day (BID) | RESPIRATORY_TRACT | 2 refills | Status: DC

## 2019-03-19 NOTE — Patient Instructions (Addendum)
ICD-10-CM   1. History of COPD  Z87.09   2. History of cigarette smoking  Z87.891   3. History of 2019 novel coronavirus disease (COVID-19)  Z86.19   4. Postviral fatigue syndrome  G93.3   5. Dyspnea, unspecified type  R06.00     Do cbc, bmet, mag, phos, lft, TSH,  Do Vit D level Do HRCT Do ECho Stop  trelegy  STart  duoneb 4 times daily + as needed Start pulmicort nebulizer twice daily  Check ONO   Followup - with  APP next 1-2 weeks but after completing above - flu shot at followup based on lab

## 2019-03-19 NOTE — Addendum Note (Signed)
Addended by: Suzzanne Cloud E on: 03/19/2019 09:56 AM   Modules accepted: Orders

## 2019-03-19 NOTE — Progress Notes (Signed)
OV 03/19/2019  Subjective:  Patient ID: Maria [name], female , DOB: 1955/09/16 , age 63 y.o. , MRN: [id number] , ADDRESS: [id number]   03/19/2019 -   Chief Complaint  Patient presents with   Hospitalization Follow-up    Post covid symptoms   Post COVID  follow-up in the pulmonary office  HPI Maria [name] 63 y.o. 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  And also poor intake.  Was diagnosed to have acute COVID-19.  She remembers being on oxygen.  At the time prior to admission she is had chronic hip pain based on her history and review of the records for which she was using a cane.  She spent a total of 5 days at [facility] [place] and was discharged on March 03, 2019.  She was treated with oxygen, Solu-Medrol and REM does Advair.  Last set of laboratories reviewed on March 03, 2019 which showed that she had normal hemoglobin and creatinine and his CRP had improved.  The only chest x-ray that was available is 1 on admission in February 26, 2019 but she has some basal infiltrates.  She now presents to the pulmonary clinic on March 19, 2019.  It is 21 days since admission and she is over 21 days since her first set of symptoms.  She tells me that she is a lot more fatigue.  She is so fatigued that she is not even able to take her inhaler properly which is Trelegy for her COPD.  She feels short of breath all the time.  She is requiring a lot of help taking a shower.  Her hip pain continues.  She is now needing the use of a walker.  Her ECOG is 4.  Her appetite is good.  She is currently not using oxygen either at night or in the daytime.     ROS - per HPI     has a past  medical history of Atherosclerosis [id number]), Chest pain, and COPD (chronic obstructive pulmonary disease) ([place]).   reports that she quit smoking about 3 years ago. Her smoking use included cigarettes. She has never used smokeless tobacco.  Past Surgical History:  Procedure Laterality Date   ABDOMINAL HYSTERECTOMY  [place]   OVARIAN CYST REMOVAL  1988    No Known Allergies  Immunization History  Administered Date(s) Administered   Influenza, Seasonal, Injecte, Preservative Fre 04/28/2008, 04/09/2010   Influenza-Unspecified 06/01/2008, 05/10/2016, 03/18/2017   Pneumococcal Polysaccharide-23 04/15/2015   Tdap 11/17/2009    Family History  Problem Relation Age of Onset   Heart failure Mother    Diabetes Mother    Kidney disease Mother    Hypertension Mother    COPD Father    Cancer Brother        lung   Diabetes Daughter      Current Outpatient Medications:    amLODipine (NORVASC) 5 MG tablet, Take 10 mg by mouth daily. Per patient on hold until her BP gets back to normal as of 03/19/19 office visit., Disp: , Rfl:    diclofenac sodium (VOLTAREN) 1 % GEL, Apply 2 g topically 4 (four) times daily as needed (pain). , Disp: ,  Rfl:    Fluticasone-Umeclidin-Vilant (TRELEGY ELLIPTA) 100-62.5-25 MCG/INH AEPB, Inhale 1 puff into the lungs daily., Disp: , Rfl:    nortriptyline (PAMELOR) 25 MG capsule, Take 25 mg by mouth at bedtime., Disp: , Rfl:    predniSONE (DELTASONE) 5 MG tablet, Label  & dispense according to the schedule below. take 8 Pills PO for 3 days, 6 Pills PO for 3 days, 4 Pills PO for 3 days, 2 Pills PO for 3 days, 1 Pills PO for 3 days, 1/2 Pill  PO for 3 days then STOP. Total 65 pills., Disp: 65 tablet, Rfl: 0   pregabalin (LYRICA) 50 MG capsule, Take 50 mg by mouth 3 (three) times daily. Per patient on hold until her BP gets back to normal as of 03/19/19 office visit., Disp: , Rfl:    venlafaxine XR (EFFEXOR-XR) 75 MG 24 hr capsule,  Take 75 mg by mouth daily., Disp: , Rfl:    VENTOLIN HFA 108 (90 Base) MCG/ACT inhaler, Inhale 2 puffs into the lungs every 6 (six) hours as needed for shortness of breath., Disp: , Rfl:       Objective:   Vitals:   03/19/19 0857  BP: 106/68  Pulse: 77  Temp: 97.7 F (36.5 C)  SpO2: 99%  Weight: 186 lb 3.2 oz (84.5 kg)  Height: 5' 6.5" (1.689 m)    Estimated body mass index is 29.6 kg/m as calculated from the following:   Height as of this encounter: 5' 6.5" (1.689 m).   Weight as of this encounter: 186 lb 3.2 oz (84.5 kg).  @WEIGHTCHANGE @  [employer]   03/19/19 0857  Weight: 186 lb 3.2 oz (84.5 kg)     Physical Exam  General Appearance:    Alert, cooperative, no distress, appears stated age -looks older, Deconditioned looking -very much so, OBESE  -yes, Sitting on Wheelchair -sitting on her walker  Head:    Normocephalic, without obvious abnormality, atraumatic  Eyes:    PERRL, conjunctiva/corneas clear,  Ears:    Normal TM's and external ear canals, both ears  Nose:   Nares normal, septum midline, mucosa normal, no drainage    or sinus tenderness. OXYGEN ON  - m . Patient is @ ra   Throat:  She has a facemask on  Neck:   Supple, symmetrical, trachea midline, no adenopathy;    thyroid:  no enlargement/tenderness/nodules; no carotid   bruit or JVD  Back:     Symmetric, no curvature, ROM normal, no CVA tenderness  Lungs:     Distress - mp , Wheeze mp, Barrell Chest - mp, Purse lip breathing - no, Crackles -some basal dry crackles.  She did cough  Chest Wall:    No tenderness or deformity.    Heart:    Regular rate and rhythm, S1 and S2 normal, no rub   or gallop, Murmur - no  Breast Exam:    NOT DONE  Abdomen:     Soft, non-tender, bowel sounds active all four quadrants,    no masses, no organomegaly. Visceral obesity - uyes  Genitalia:   NOT DONE  Rectal:   NOT DONE  Extremities:   Extremities - normal, Has Cane - at home, Clubbing - no, Edema - no  Pulses:    2+ and symmetric all extremities  Skin:   Stigmata of Connective Tissue Disease - no  Lymph nodes:   Cervical, supraclavicular, and axillary nodes normal  Psychiatric:  Neurologic:   Pleasant - yes, Anxious - yes,  Flat affect - yes  CAm-ICU - neg, Alert and Oriented x 3 - yes, Moves all 4s - yes, Speech - normal, Cognition - intact           Assessment:       ICD-10-CM   1. History of COPD  Z87.09   2. History of cigarette smoking  Z87.891   3. History of 2019 novel coronavirus disease (COVID-19)  Z86.19   4. Postviral fatigue syndrome  G93.3   5. Dyspnea, unspecified type  R06.00 CBC w/Diff    Basic Metabolic Panel (BMET)    Magnesium    Phosphorus    Hepatic function panel    TSH    ECHOCARDIOGRAM COMPLETE    Pulse oximetry, overnight  6. Vitamin D deficiency  E55.9 Vitamin D (25 hydroxy)  7. Shortness of breath  R06.02        Plan:     Patient Instructions     ICD-10-CM   1. History of COPD  Z87.09   2. History of cigarette smoking  Z87.891   3. History of 2019 novel coronavirus disease (COVID-19)  Z86.19   4. Postviral fatigue syndrome  G93.3   5. Dyspnea, unspecified type  R06.00     Do cbc, bmet, mag, phos, lft, TSH,  Do Vit D level Do HRCT Do ECho Stop  trelegy  STart  duoneb 4 times daily + as needed Start pulmicort nebulizer twice daily  Check ONO   Followup - with  APP next 1-2 weeks but after completing above - flu shot at followup based on lab      SIGNATURE    Dr. Brand Males, M.D., F.C.C.P,  Pulmonary and Critical Care Medicine Staff Physician, [place] Director - Interstitial Lung Disease  Program  Pulmonary [place] [facility] [place], [place], [address]  Pager: [phone], If no answer or between  15:00h - 7:00h: call [phone] Telephone: [phone]  9:40 AM 03/19/2019

## 2019-03-21 ENCOUNTER — Telehealth: Payer: Self-pay | Admitting: Internal Medicine

## 2019-03-21 DIAGNOSIS — Z8709 Personal history of other diseases of the respiratory system: Secondary | ICD-10-CM

## 2019-03-21 DIAGNOSIS — J449 Chronic obstructive pulmonary disease, unspecified: Secondary | ICD-10-CM | POA: Diagnosis not present

## 2019-03-21 NOTE — Telephone Encounter (Signed)
Called and spoke to pt. Pt states she hasnt received a call yet about her neb machine. Per pt's chart this order was never placed. Order has now been placed. Pt states she has already picked up her neb meds. Pt is aware she will receive a call from DME for neb machine.   PCCs please advise. The pt does not have a DME but needs an ONO and neb machine, figured they could come from the same DME. Thanks.

## 2019-03-21 NOTE — Telephone Encounter (Signed)
I have sent both the ONO order & the Nebulizer order to [facility].

## 2019-03-21 NOTE — Telephone Encounter (Signed)
Noted Will sign off 

## 2019-03-22 ENCOUNTER — Ambulatory Visit (HOSPITAL_COMMUNITY): Payer: BC Managed Care – PPO | Attending: Cardiovascular Disease

## 2019-03-22 ENCOUNTER — Telehealth: Payer: Self-pay | Admitting: Internal Medicine

## 2019-03-22 ENCOUNTER — Other Ambulatory Visit: Payer: Self-pay

## 2019-03-22 DIAGNOSIS — R0600 Dyspnea, unspecified: Secondary | ICD-10-CM | POA: Diagnosis not present

## 2019-03-22 NOTE — Telephone Encounter (Signed)
Called and spoke with pt. Pt is inquiring if she would be able to have a portable oxygen tank when moving around the house or when going out. Pt states she has a pulse oximeter which she keeps on her finger and she states it has read in 84-85% after exertion with HR of 110. She states it resolves >90% after catching her breath an resting.   Pt states she is not currently on any home O2 and reports having a nebulizer compressor with [facility]. She has an appt scheduled with [facility] 04/02/2019 and, as I was about to offer a sooner appt for POC qualification, pt stated she had tested positive for COVID 02/26/2019 and stayed at the [facility] for five days. I let her know we will plan on keeping the original appt 04/02/2019 with [facility] and suggested she call our office on Tuesday 03/26/2019 (since Eustaquio Maize is not in the office today) to update [name] on how she feels and see if she would be able to have a sooner appt based on what our providers are comfortable with. [facility] protocol states that pts do not require a repeat COVID test (as long as they have quarantined after discharge for 2 weeks and are not showing symptoms) to be see in-office. Pt verbalized understanding to this with no further questions. I am routing this message to [place] as an [place]. Nothing further needed at this time.

## 2019-03-23 DIAGNOSIS — J449 Chronic obstructive pulmonary disease, unspecified: Secondary | ICD-10-CM | POA: Diagnosis not present

## 2019-03-23 DIAGNOSIS — R0902 Hypoxemia: Secondary | ICD-10-CM | POA: Diagnosis not present

## 2019-03-26 ENCOUNTER — Telehealth: Payer: Self-pay | Admitting: Internal Medicine

## 2019-03-26 DIAGNOSIS — Z8616 Personal history of COVID-19: Secondary | ICD-10-CM

## 2019-03-26 DIAGNOSIS — Z8619 Personal history of other infectious and parasitic diseases: Secondary | ICD-10-CM

## 2019-03-26 NOTE — Telephone Encounter (Signed)
Spoke with pt, she states when she is sitting still she can breathe fine. When she gets up to do anything, she is unable to catch her breath. She tested positive for Covid on 02/26/2019 and was hospitalized at [facility]. She still has a loss of taste and smell and increased SOB but doesn't have any other Covid symptoms. She denies fever and wants to know if she can come in sooner to see a NP to see if she qualifies for oxygen. She would have to come into the office to qualify but wanted to see ifr she needed Covid testing before she comes in. She does have an appt with BW on 04/03/2019 but would like this appt moved up. Beth has openings tomorrow. Please advise.

## 2019-03-26 NOTE — Telephone Encounter (Signed)
Spoke with patient. She is aware of the COVID testing procedure. Since she lives in [place], I advised her to go to [place] her that I would place order today, she verbalized understanding.   Nothing further needed at time of call.

## 2019-03-26 NOTE — Telephone Encounter (Signed)
Yes please order repeat covid testing and then can be seen in office earlier

## 2019-03-27 ENCOUNTER — Other Ambulatory Visit: Payer: Self-pay

## 2019-03-27 ENCOUNTER — Other Ambulatory Visit
Admission: RE | Admit: 2019-03-27 | Discharge: 2019-03-27 | Disposition: A | Discharge status: Home / Self Care | Payer: BC Managed Care – PPO | Source: Ambulatory Visit | Attending: Primary Care | Admitting: Primary Care

## 2019-03-27 ENCOUNTER — Ambulatory Visit: Payer: BC Managed Care – PPO | Admitting: Primary Care

## 2019-03-27 DIAGNOSIS — Z01812 Encounter for preprocedural laboratory examination: Secondary | ICD-10-CM | POA: Insufficient documentation

## 2019-03-27 DIAGNOSIS — U071 COVID-19: Secondary | ICD-10-CM | POA: Insufficient documentation

## 2019-03-27 DIAGNOSIS — Z20828 Contact with and (suspected) exposure to other viral communicable diseases: Secondary | ICD-10-CM | POA: Diagnosis not present

## 2019-03-27 LAB — SARS CORONAVIRUS 2 (TAT 6-24 HRS): SARS Coronavirus 2: POSITIVE — AB

## 2019-03-27 NOTE — Progress Notes (Signed)
Covid testing was still positive, needs to be televisit. I will discuss further with MR.

## 2019-03-30 ENCOUNTER — Telehealth: Payer: Self-pay | Admitting: Pulmonary Disease

## 2019-03-30 MED ORDER — PREDNISONE 20 MG PO TABS: 40.0000 mg | ORAL_TABLET | Freq: Every day | ORAL | 0 refills | Status: AC

## 2019-03-30 NOTE — Telephone Encounter (Signed)
[place] Telephone Encounter  Patient contacted weekend answering page system. I called back and patient reports hypoxemia to 85-86% with exertion that resolves with rest to >90%. She recently completed her steroids and has noticed the hypoxemia in the last week. She is scheduled to be seen in clinic for ambulatory O2 however calls to request oxygen now if possible.  I advised her to present to the ED for evaluation and stated that she may need to be admitted. She became tearful about returning and stated she could not afford another hospitalization at this time. I will prescribe steroids for now until her next office appointment but strongly advised her to come to the ED if her saturations were <88% with improvement with rest.  Plan: Recommended ED evaluation. Patient declined. Prednisone 40 mg x 5 days  Rodman Pickle, M.D. [facility] Pulmonary/Critical Care Medicine 03/30/2019 2:02 PM

## 2019-04-01 ENCOUNTER — Telehealth: Payer: Self-pay | Admitting: Internal Medicine

## 2019-04-01 NOTE — Telephone Encounter (Signed)
Call returned to patient, she states she would like oxygen sent to her home. She reports her oxygen is dropping down to 85% with exertion. She reports that her appt was made a televisit due to her positive covid test but she is having a hard time with her oxygen. Patient is barely able to speak a complete sentence. I explained that unfortunatley oxygen is something that you have to be in office to be qualified for. I explained that because her COVID test is current she would probably need to present to ER however I would touch bases with provider first. Voiced understanding. Advised patient to stay as immobile as possible until she hears back from [name]. If symptoms worsen to call 911. She reports nebs are not working and inhaler gives her no relief.   B Mack please advise. See other phone messages. Advised to go to ER 09/12 and pred taper sent in. Pt confirms she is taking the prednisone.

## 2019-04-01 NOTE — Telephone Encounter (Signed)
Call returned to patient, made aware of recommendations. She declines to go to ER. She reports she does not want to go to ER. I made her aware if her symptoms worsen she needs to call 911. I advised her to no wait until it was too late to contact emergency help as these things can happen fast. She voiced understanding. Declined tele-visit. Nothing further needed at this time.

## 2019-04-01 NOTE — Telephone Encounter (Signed)
Agree with stated information above.  If patient needs oxygen then likely she will need to present to our office.  With the acute worsening symptoms as well as lower oxygen levels.  And difficulty to talk in complete sentences on triage phone per this message.  I would recommend the patient presents to the emergency room for further evaluation.  If patient's unwilling to do this then pt can have a televisit first with APP. Likely pt will need emergency eval based off of information so far.   Wyn Quaker FNP

## 2019-04-02 ENCOUNTER — Ambulatory Visit: Payer: BC Managed Care – PPO | Admitting: Primary Care

## 2019-04-02 NOTE — Progress Notes (Signed)
Virtual Visit via Telephone Note  I connected with Maria [name] on 04/02/19 at  9:30 AM EDT by telephone and verified that I am speaking with the correct person using two identifiers.  Location: Patient: Home Provider: Office   I discussed the limitations, risks, security and privacy concerns of performing an evaluation and management service by telephone and the availability of in person appointments. I also discussed with the patient that there may be a patient responsible charge related to this service. The patient expressed understanding and agreed to proceed.   History of Present Illness: 63 year old female, former smoker quit in 2017 (0-pack-year history).  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  Patient of [name], seen for initial consult/post COVID follow-up on 03/19/2019.  Admitted on February 26, 2019 with several day history of increased weakness, shortness of breath and nausea/diarrhea.  He was diagnosed with COVID-19 and spent a total of 5 days at [facility].  Chest x-ray on admission showed some basal infiltrates.  Treated with oxygen, Solu-Medrol and Advair.  During follow-up visit she reports feeling a lot more fatigued and reports feeling short of breath all the time.  She is maintained on Trelegy for COPD.  Not currently on oxygen.  Labs, HRCT, ONO and echocardiogram ordered.  Trelegy was stopped and she was started on Pulmicort nebulizer twice daily and DuoNeb's 4 times a day and as needed.  Ordered for new nebulizer machine with [facility]. Follow-up in 1 to 2 weeks after testing and needs flu shot during that visit.  04/03/2019 Presents today for a 2-week follow-up.  Echocardiogram showed normal systolic function with estimated EF 60 to 65%.  Left ventricle diastolic doppler consistent with impaired relaxation-determinant filling  pressures.  Right ventricle with normal systolic function, increased wall thickness-systolic pressure could not be assessed.  HRCT is scheduled for 9/29.  Patient called on 9/4 asking about portable oxygen tank order.  Reports that her O2 level has read 84 to 85% after exertion with a heart rate of 110.  She reports that her oxygen level recovers greater than 90% after resting.  Is not currently on oxygen at home will need to be walked in office to qualify for POC. Repeat COVID testing was positive, patient reports that she still has lost her taste and smell with increased shortness of breath.   She is feeling a little better today. Family is checking on her. No other help at home. Reports feeling fine when sitting but her O2 drops as low as 85% on exertion. She is able to recover with rest. Taking prednisone taper as prescribed, feels it has helped her chest tightness. She has dry/hacking cough with no mucus production. She is eating and drinking ok. Denies fever.   Observations/Objective:  - No significant wheezing or cough - Mild shortness of breath while speaking  Assessment and Plan: She is feeling a little better today. Labs reviewed and appear normal except for vit D level. WBC mildly elevated likely d/t current prednisone use. Completed ONO last week with [facility], awaiting results to be faxed. HRCT scheduled for 9/29. Echocardiogram appears normal other than left ventricle with impaired relaxation.   Covid-19: - Slowly improving, reports decreased stamina. Afebrile.  - Needs home health to check ambulatory O2 - Awaiting ONO results, requesting from [place] care  - HRCT scheduled for 9/29 - Advised patient if respiratory status worsens or  O2 sustaining <88% present to ED   COPD: - Continue Pulmicort twice daily and Duoneb's QID/prn - Continue prednisone 20mg  x 1 additional week; then taper to 10mg  x 1 week  - RX tessalon perles 200mg  TID prn cough   Vit D deficiency: - Vit D level  22 - Rx 2,000 units PO daily   Follow Up Instructions:   - Televisit in 2 weeks with NP  I discussed the assessment and treatment plan with the patient. The patient was provided an opportunity to ask questions and all were answered. The patient agreed with the plan and demonstrated an understanding of the instructions.   The patient was advised to call back or seek an in-person evaluation if the symptoms worsen or if the condition fails to improve as anticipated.  I provided 22 minutes of non-face-to-face time during this encounter.   Martyn Ehrich, NP

## 2019-04-03 ENCOUNTER — Encounter: Payer: Self-pay | Admitting: Primary Care

## 2019-04-03 ENCOUNTER — Ambulatory Visit (INDEPENDENT_AMBULATORY_CARE_PROVIDER_SITE_OTHER): Payer: BC Managed Care – PPO | Admitting: Primary Care

## 2019-04-03 ENCOUNTER — Other Ambulatory Visit: Payer: Self-pay

## 2019-04-03 DIAGNOSIS — Z8709 Personal history of other diseases of the respiratory system: Secondary | ICD-10-CM

## 2019-04-03 DIAGNOSIS — R0602 Shortness of breath: Secondary | ICD-10-CM | POA: Diagnosis not present

## 2019-04-03 DIAGNOSIS — Z8619 Personal history of other infectious and parasitic diseases: Secondary | ICD-10-CM

## 2019-04-03 DIAGNOSIS — Z8616 Personal history of COVID-19: Secondary | ICD-10-CM

## 2019-04-03 MED ORDER — VITAMIN D3 50 MCG (2000 UT) PO CAPS: 2000.0000 [IU] | ORAL_CAPSULE | Freq: Every day | ORAL | 1 refills | Status: DC

## 2019-04-03 MED ORDER — PREDNISONE 10 MG PO TABS: ORAL_TABLET | ORAL | 0 refills | Status: DC

## 2019-04-03 MED ORDER — BENZONATATE 200 MG PO CAPS: 200.0000 mg | ORAL_CAPSULE | Freq: Three times a day (TID) | ORAL | 1 refills | Status: DC | PRN

## 2019-04-03 NOTE — Patient Instructions (Addendum)
It was a pleasure speaking with you today Maria [name]   Results: - Labs look pretty good except for low vitamin D level and slight WBC elevation - I am requesting results of your overnight oximetry test with [facility] and will review later today or tomorrow  Recommendations:  - Please take 2,000 units Vitamin D - over the counter  - Delsym cough medication twice daily for cough  - Prednisone 20mg  x 1 week; 10 mg x 1 week  RX: - Tessalone perles three times a day for cough   Referral: - Home health eval/treat - check ambulatory O2 level   We will hopefully get a nurse out to you as soon as possible to check your O2 saturation on ambulation and if you need oxygen will place order (last resort would be a VIDEO visit so we could visualize your O2 reading)  Follow-up: - Televisit 2 weeks with NP  - HRCT is schedule for 9/29  - Please go to ED if you develop acute respiratory symptoms or O2 sustaining <88 %    COVID-19 COVID-19 is a respiratory infection that is caused by a virus called severe acute respiratory syndrome coronavirus 2 (SARS-CoV-2). The disease is also known as coronavirus disease or novel coronavirus. In some people, the virus may not cause any symptoms. In others, it may cause a serious infection. The infection can get worse quickly and can lead to complications, such as:  Pneumonia, or infection of the lungs.  Acute respiratory distress syndrome or ARDS. This is fluid build-up in the lungs.  Acute respiratory failure. This is a condition in which there is not enough oxygen passing from the lungs to the body.  Sepsis or septic shock. This is a serious bodily reaction to an infection.  Blood clotting problems.  Secondary infections due to bacteria or fungus. The virus that causes COVID-19 is contagious. This means that it can spread from person to person through droplets from coughs and sneezes (respiratory secretions). What are the causes? This illness is caused by a  virus. You may catch the virus by:  Breathing in droplets from an infected person's cough or sneeze.  Touching something, like a table or a doorknob, that was exposed to the virus (contaminated) and then touching your mouth, nose, or eyes. What increases the risk? Risk for infection You are more likely to be infected with this virus if you:  Live in or travel to an area with a COVID-19 outbreak.  Come in contact with a sick person who recently traveled to an area with a COVID-19 outbreak.  Provide care for or live with a person who is infected with COVID-19. Risk for serious illness You are more likely to become seriously ill from the virus if you:  Are 52 years of age or older.  Have a long-term disease that lowers your body's ability to fight infection (immunocompromised).  Live in a nursing home or long-term care facility.  Have a long-term (chronic) disease such as: ? Chronic lung disease, including chronic obstructive pulmonary disease or asthma ? Heart disease. ? Diabetes. ? Chronic kidney disease. ? Liver disease.  Are obese. What are the signs or symptoms? Symptoms of this condition can range from mild to severe. Symptoms may appear any time from 2 to 14 days after being exposed to the virus. They include:  A fever.  A cough.  Difficulty breathing.  Chills.  Muscle pains.  A sore throat.  Loss of taste or smell. Some people  may also have stomach problems, such as nausea, vomiting, or diarrhea. Other people may not have any symptoms of COVID-19. How is this diagnosed? This condition may be diagnosed based on:  Your signs and symptoms, especially if: ? You live in an area with a COVID-19 outbreak. ? You recently traveled to or from an area where the virus is common. ? You provide care for or live with a person who was diagnosed with COVID-19.  A physical exam.  Lab tests, which may include: ? A nasal swab to take a sample of fluid from your nose. ? A  throat swab to take a sample of fluid from your throat. ? A sample of mucus from your lungs (sputum). ? Blood tests.  Imaging tests, which may include, X-rays, CT scan, or ultrasound. How is this treated? At present, there is no medicine to treat COVID-19. Medicines that treat other diseases are being used on a trial basis to see if they are effective against COVID-19. Your health care provider will talk with you about ways to treat your symptoms. For most people, the infection is mild and can be managed at home with rest, fluids, and over-the-counter medicines. Treatment for a serious infection usually takes places in a hospital intensive care unit (ICU). It may include one or more of the following treatments. These treatments are given until your symptoms improve.  Receiving fluids and medicines through an IV.  Supplemental oxygen. Extra oxygen is given through a tube in the nose, a face mask, or a hood.  Positioning you to lie on your stomach (prone position). This makes it easier for oxygen to get into the lungs.  Continuous positive airway pressure (CPAP) or bi-level positive airway pressure (BPAP) machine. This treatment uses mild air pressure to keep the airways open. A tube that is connected to a motor delivers oxygen to the body.  Ventilator. This treatment moves air into and out of the lungs by using a tube that is placed in your windpipe.  Tracheostomy. This is a procedure to create a hole in the neck so that a breathing tube can be inserted.  Extracorporeal membrane oxygenation (ECMO). This procedure gives the lungs a chance to recover by taking over the functions of the heart and lungs. It supplies oxygen to the body and removes carbon dioxide. Follow these instructions at home: Lifestyle  If you are sick, stay home except to get medical care. Your health care provider will tell you how long to stay home. Call your health care provider before you go for medical care.  Rest at  home as told by your health care provider.  Do not use any products that contain nicotine or tobacco, such as cigarettes, e-cigarettes, and chewing tobacco. If you need help quitting, ask your health care provider.  Return to your normal activities as told by your health care provider. Ask your health care provider what activities are safe for you. General instructions  Take over-the-counter and prescription medicines only as told by your health care provider.  Drink enough fluid to keep your urine pale yellow.  Keep all follow-up visits as told by your health care provider. This is important. How is this prevented?  There is no vaccine to help prevent COVID-19 infection. However, there are steps you can take to protect yourself and others from this virus. To protect yourself:   Do not travel to areas where COVID-19 is a risk. The areas where COVID-19 is reported change often. To identify high-risk areas  and travel restrictions, check the CDC travel website: FatFares.com.br  If you live in, or must travel to, an area where COVID-19 is a risk, take precautions to avoid infection. ? Stay away from people who are sick. ? Wash your hands often with soap and water for 20 seconds. If soap and water are not available, use an alcohol-based hand sanitizer. ? Avoid touching your mouth, face, eyes, or nose. ? Avoid going out in public, follow guidance from your state and local health authorities. ? If you must go out in public, wear a cloth face covering or face mask. ? Disinfect objects and surfaces that are frequently touched every day. This may include:  Counters and tables.  Doorknobs and light switches.  Sinks and faucets.  Electronics, such as phones, remote controls, keyboards, computers, and tablets. To protect others: If you have symptoms of COVID-19, take steps to prevent the virus from spreading to others.  If you think you have a COVID-19 infection, contact your  health care provider right away. Tell your health care team that you think you may have a COVID-19 infection.  Stay home. Leave your house only to seek medical care. Do not use public transport.  Do not travel while you are sick.  Wash your hands often with soap and water for 20 seconds. If soap and water are not available, use alcohol-based hand sanitizer.  Stay away from other members of your household. Let healthy household members care for children and pets, if possible. If you have to care for children or pets, wash your hands often and wear a mask. If possible, stay in your own room, separate from others. Use a different bathroom.  Make sure that all people in your household wash their hands well and often.  Cough or sneeze into a tissue or your sleeve or elbow. Do not cough or sneeze into your hand or into the air.  Wear a cloth face covering or face mask. Where to find more information  Centers for Disease Control and Prevention: PurpleGadgets.be  World Health Organization: https://www.castaneda.info/ Contact a health care provider if:  You live in or have traveled to an area where COVID-19 is a risk and you have symptoms of the infection.  You have had contact with someone who has COVID-19 and you have symptoms of the infection. Get help right away if:  You have trouble breathing.  You have pain or pressure in your chest.  You have confusion.  You have bluish lips and fingernails.  You have difficulty waking from sleep.  You have symptoms that get worse. These symptoms may represent a serious problem that is an emergency. Do not wait to see if the symptoms will go away. Get medical help right away. Call your local emergency services (911 in the [place] Do not drive yourself to the hospital. Let the emergency medical personnel know if you think you have COVID-19. Summary  COVID-19 is a respiratory infection that is caused by a  virus. It is also known as coronavirus disease or novel coronavirus. It can cause serious infections, such as pneumonia, acute respiratory distress syndrome, acute respiratory failure, or sepsis.  The virus that causes COVID-19 is contagious. This means that it can spread from person to person through droplets from coughs and sneezes.  You are more likely to develop a serious illness if you are 63 years of age or older, have a weak immunity, live in a nursing home, or have chronic disease.  There is no medicine  to treat COVID-19. Your health care provider will talk with you about ways to treat your symptoms.  Take steps to protect yourself and others from infection. Wash your hands often and disinfect objects and surfaces that are frequently touched every day. Stay away from people who are sick and wear a mask if you are sick. This information is not intended to replace advice given to you by your health care provider. Make sure you discuss any questions you have with your health care provider. Document Released: 08/09/2018 Document Revised: 11/29/2018 Document Reviewed: 08/09/2018 Elsevier Patient Education  2020 [employer].   This information is directly available on the CDC website: RunningShows.co.za.html    Source:CDC Reference to specific commercial products, manufacturers, companies, or trademarks does not constitute its endorsement or recommendation by the [place], [place], or Centers for [employer] and Prevention.

## 2019-04-05 ENCOUNTER — Telehealth: Payer: Self-pay | Admitting: Primary Care

## 2019-04-05 DIAGNOSIS — G933 Postviral fatigue syndrome: Secondary | ICD-10-CM

## 2019-04-05 DIAGNOSIS — R0602 Shortness of breath: Secondary | ICD-10-CM

## 2019-04-05 DIAGNOSIS — I951 Orthostatic hypotension: Secondary | ICD-10-CM

## 2019-04-05 DIAGNOSIS — J449 Chronic obstructive pulmonary disease, unspecified: Secondary | ICD-10-CM

## 2019-04-05 DIAGNOSIS — G9331 Postviral fatigue syndrome: Secondary | ICD-10-CM

## 2019-04-05 DIAGNOSIS — G4734 Idiopathic sleep related nonobstructive alveolar hypoventilation: Secondary | ICD-10-CM

## 2019-04-05 NOTE — Telephone Encounter (Signed)
Call returned to patient, confirmed DOB, made aware of results per BW. Voiced understanding. Order placed. Also inquring about home health referral. I made her aware I do not see that the order was placed however I would go ahead and place the order today and due to timing (Friday at 5pm) they would be reaching out to her next week. Voiced understanding. Nothing further needed at this time.

## 2019-04-05 NOTE — Telephone Encounter (Signed)
ONO on RA showed O2 low 70% with baseline 87%. Spent 10 hours 38 mins <88%. Needs order for nocturnal oxygen 2L.   Please check on home health referral

## 2019-04-08 ENCOUNTER — Telehealth: Payer: Self-pay

## 2019-04-08 NOTE — Telephone Encounter (Signed)
Pt needs O2 qualification based on last week's telephone visit with Beth.  We have tried to schedule home nursing O2 eval, but d/t pt's insurance and recent covid + status (5.5 wks out from original covid testing), is not eligible for home health nursing. Spoke with [place], Tammy Parrett, Burman Nieves, and Dr. Halford Chessman, who verified that while we do not have an office protocol for these situations, at [facility] their protocol is to take a pt off of negative pressure rooms after 20 days, so pt should be considered safe to come in to office as long as pt is not experiencing fevers or new symptoms.    I spoke with pt who verified that she has not been experiencing any fevers.  Pt is scheduled to come in tomorrow at 9:30 to see [facility] and have a qualifying walk test- this will need to include movement in the room per Burman Nieves.    Nothing further needed at this time- will close encounter.

## 2019-04-08 NOTE — Telephone Encounter (Signed)
Order addended at [facility] spoke with Judeen Hammans and order needed to be redone d/t insurance purposes.  Nothing further needed at this time- will close encounter.

## 2019-04-08 NOTE — Addendum Note (Signed)
Addended by: Len Blalock on: 04/08/2019 09:57 AM   Modules accepted: Orders

## 2019-04-08 NOTE — Addendum Note (Signed)
Addended by: Len Blalock on: 04/08/2019 09:36 AM   Modules accepted: Orders

## 2019-04-09 ENCOUNTER — Ambulatory Visit: Payer: BC Managed Care – PPO | Admitting: Primary Care

## 2019-04-09 ENCOUNTER — Other Ambulatory Visit: Payer: Self-pay

## 2019-04-09 ENCOUNTER — Encounter: Payer: Self-pay | Admitting: Primary Care

## 2019-04-09 VITALS — BP 128/70 | HR 73 | Ht 66.0 in | Wt 187.4 lb

## 2019-04-09 DIAGNOSIS — U071 COVID-19: Secondary | ICD-10-CM

## 2019-04-09 DIAGNOSIS — Z9981 Dependence on supplemental oxygen: Secondary | ICD-10-CM

## 2019-04-09 DIAGNOSIS — J9611 Chronic respiratory failure with hypoxia: Secondary | ICD-10-CM | POA: Diagnosis not present

## 2019-04-09 DIAGNOSIS — J449 Chronic obstructive pulmonary disease, unspecified: Secondary | ICD-10-CM

## 2019-04-09 DIAGNOSIS — J1289 Other viral pneumonia: Secondary | ICD-10-CM

## 2019-04-09 DIAGNOSIS — R0902 Hypoxemia: Secondary | ICD-10-CM | POA: Diagnosis not present

## 2019-04-09 MED ORDER — PROMETHAZINE-CODEINE 6.25-10 MG/5ML PO SYRP: 5.0000 mL | ORAL_SOLUTION | Freq: Three times a day (TID) | ORAL | 0 refills | Status: DC | PRN

## 2019-04-09 NOTE — Assessment & Plan Note (Signed)
-   Lungs mostly clear on exam, faint rales at bases  - Referral to DME for incentive spirometer (5-10/hr) - Scheduled for HRCT on 9/29

## 2019-04-09 NOTE — Assessment & Plan Note (Addendum)
-   Continue Pulmicort neb twice daily and Duoneb QID

## 2019-04-09 NOTE — Patient Instructions (Addendum)
It was a pleasure meeting you today, I think you are making progress slowly  Recommendations: - Continue Pulmicort twice daily - Continue Duoneb four times day - Use incentive spirometer 5 deep breaths every hour you are awake   Oxygen: - Referral to aero care for new oxygen - Wear 3L on exertion and at night - Monitor O2 level and keep >88-90%  Cough: - Continue Delsym cough syrup twice daily for another 1-2 weeks - Take tessalon perles three times a day as needed for cough - RX promethazine with codeine- take 19ml every 8 hours as needed for cough  (do not combine with alcohol or other sedation medication. Do not drive while taking)  Follow-up: - Change follow-up visit to the week of Oct 5th (MR if available, otherwise Beth NP)   Home Oxygen Use, Adult When a medical condition keeps you from getting enough oxygen, your health care provider may instruct you to take extra oxygen at home. Your health care provider will let you know:  When to take oxygen.  For how long to take oxygen.  How quickly oxygen should be delivered (flow rate), in liters per minute (LPM or L/M). Home oxygen can be given through:  A mask.  A nasal cannula. This is a device or tube that goes in the nostrils.  A transtracheal catheter. This is a small, flexible tube placed in the trachea.  A tracheostomy. This is a surgically made opening in the trachea. These devices are connected with tubing to an oxygen source, such as:  A tank. Tanks hold oxygen in gas form. They must be replaced when the oxygen is used up.  A liquid oxygen device. This holds oxygen in liquid form. It must be replaced when the oxygen is used up.  An oxygen concentrator machine. This filters oxygen in the room. It uses electricity, so you must have a backup cylinder of oxygen in case the power goes out. Supplies needed: To use oxygen, you will need:  A mask, nasal cannula, transtracheal catheter, or tracheostomy.  An oxygen  tank, a liquid oxygen device, or an oxygen concentrator.  The tape that your health care provider recommends (optional). If you use a transtracheal catheter and your prescribed flow rate is 1 LPM or greater, you will also need a humidifier. Risks and complications  Fire. This can happen if the oxygen is exposed to a heat source, flame, or spark.  Injury to skin. This can happen if liquid oxygen touches your skin.  Organ damage. This can happen if you get too little oxygen. How to use oxygen Your health care provider or a representative from your [facility] will show you how to use your oxygen device. Follow her or his instructions. The instructions may look something like this: 1. Wash your hands. 2. If you use an oxygen concentrator, make sure it is plugged in. 3. Place one end of the tube into the port on the tank, device, or machine. 4. Place the mask over your nose and mouth. Or, place the nasal cannula and secure it with tape if instructed. If you use a tracheostomy or transtracheal catheter, connect it to the oxygen source as directed. 5. Make sure the liter-flow setting on the machine is at the level prescribed by your health care provider. 6. Turn on the machine or adjust the knob on the tank or device to the correct liter-flow setting. 7. When you are done, turn off and unplug the machine, or turn the knob  to OFF. How to clean and care for the oxygen supplies Nasal cannula  Clean it with a warm, wet cloth daily or as needed.  Wash it with a liquid soap once a week.  Rinse it thoroughly once or twice a week.  Replace it every 2-4 weeks.  If you have an infection, such as a cold or pneumonia, change the cannula when you get better. Mask  Replace it every 2-4 weeks.  If you have an infection, such as a cold or pneumonia, change the mask when you get better. Humidifier bottle  Wash the bottle between each refill: ? Wash it with soap and warm water. ? Rinse it  thoroughly. ? Disinfect it and its top. ? Air-dry it.  Make sure it is dry before you refill it. Oxygen concentrator  Clean the air filter at least twice a week according to directions from your home medical equipment and service company.  Wipe down the cabinet every day. To do this: ? Unplug the unit. ? Wipe down the cabinet with a damp cloth. ? Dry the cabinet. Other equipment  Change any extra tubing every 1-3 months.  Follow instructions from your health care provider about taking care of any other equipment. Safety tips Fire safety tips   Keep your oxygen and oxygen supplies at least 5 ft away from sources of heat, flames, and sparks at all times.  Do not allow smoking near your oxygen. Put up "no smoking" signs in your home. Avoid smoking areas when in public.  Do not use materials that can burn (are flammable) while you use oxygen.  When you go to a restaurant with portable oxygen, ask to be seated in the nonsmoking section.  Keep a Data processing manager close by. Let your fire department know that you have oxygen in your home.  Test your home smoke detectors regularly. Traveling  Secure your oxygen tank in the vehicle so that it does not move around. Follow instructions from your medical device company about how to safely secure your tank.  Make sure you have enough oxygen for the amount of time you will be away from home.  If you are planning air travel, contact the airline to find out if they allow the use of an approved portable oxygen concentrator. You may also need documents from your health care provider and medical device company before you travel. General safety tips  If you use an oxygen cylinder, make sure it is in a stand or secured to an object that will not move (fixed object).  If you use liquid oxygen, make sure its container is kept upright.  If you use an oxygen concentrator: ? Dance movement psychotherapist company. Make sure you are given priority service in  the event that your power goes out. ? Avoid using extension cords, if possible. Follow these instructions at home:  Use oxygen only as told by your health care provider.  Do not use alcohol or other drugs that make you relax (sedating drugs) unless instructed. They can slow down your breathing rate and make it hard to get in enough oxygen.  Know how and when to order a refill of oxygen.  Always keep a spare tank of oxygen. Plan ahead for holidays when you may not be able to get a prescription filled.  Use water-based lubricants on your lips or nostrils. Do not use oil-based products like petroleum jelly.  To prevent skin irritation on your cheeks or behind your ears, tuck some gauze under the  tubing. Contact a health care provider if:  You get headaches often.  You have shortness of breath.  You have a lasting cough.  You have anxiety.  You are sleepy all the time.  You develop an illness that affects your breathing.  You cannot exercise at your regular level.  You are restless.  You have difficult or irregular breathing, and it is getting worse.  You have a fever.  You have persistent redness under your nose. Get help right away if:  You are confused.  You have blue lips or fingernails.  You are struggling to breathe. Summary  Your health care provider or a representative from your [facility] will show you how to use your oxygen device. Follow her or his instructions.  If you use an oxygen concentrator, make sure it is plugged in.  Make sure the liter-flow setting on the machine is at the level prescribed by your health care provider.  Keep your oxygen and oxygen supplies at least 5 ft away from sources of heat, flames, and sparks at all times. This information is not intended to replace advice given to you by your health care provider. Make sure you discuss any questions you have with your health care provider. Document Released: 09/24/2003  Document Revised: 12/21/2017 Document Reviewed: 01/26/2016 Elsevier Patient Education  2020 [employer].

## 2019-04-09 NOTE — Assessment & Plan Note (Addendum)
-   She is 42 days out since her first positive COVID test. Reports ongoing fatigue, dyspnea and dry cough. Afebrile. She is making slow progress but continues to improve a little each day - Qualified for home oxygen today, requiring 3L on exertion and at night  - HRCT scheduled for next week

## 2019-04-09 NOTE — Assessment & Plan Note (Signed)
-   Ambulatory walk showed desaturation to 85% on RA - Requiring 3L on exertion to keep O2>90% and 2-3 L at night  - Referral to [facility] care for new oxygen start - Follow up in 2 weeks

## 2019-04-09 NOTE — Addendum Note (Signed)
Addended by: Martyn Ehrich on: 04/09/2019 11:28 AM   Modules accepted: Orders

## 2019-04-09 NOTE — Progress Notes (Addendum)
@Patient  ID: Maria [name], female    DOB: February 19, 1956, 63 y.o.   MRN: [id number]  Chief Complaint  Patient presents with  . Follow-up    needs O2 qualifying walk.      Referring provider: Chelsea Primus, MD  HPI: 63 year old female, former smoker quit in 2017 (0-pack-year history).  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  Patient of [name], seen for initial consult/post COVID follow-up on 03/19/2019.  Admitted on February 26, 2019 with several day history of increased weakness, shortness of breath and nausea/diarrhea.  He was diagnosed with COVID-19 and spent a total of 5 days at [facility].  Chest x-ray on admission showed some basal infiltrates.  Treated with oxygen, Solu-Medrol and Advair.  During follow-up visit she reports feeling a lot more fatigued and reports feeling short of breath all the time.  She is maintained on Trelegy for COPD.  Not currently on oxygen.  Labs, HRCT, ONO and echocardiogram ordered.  Trelegy was stopped and she was started on Pulmicort nebulizer twice daily and DuoNeb's 4 times a day and as needed.  Ordered for new nebulizer machine with [facility] care. Follow-up in 1 to 2 weeks after testing and needs flu shot during that visit.  Previous [facility] pulmonary encounters: 04/03/2019 Presents today for a 2-week follow-up.  Echocardiogram showed normal systolic function with estimated EF 60 to 65%.  Left ventricle diastolic doppler consistent with impaired relaxation-determinant filling pressures.  Right ventricle with normal systolic function, increased wall thickness-systolic pressure could not be assessed.  HRCT is scheduled for 9/29.  Patient called on 9/4 asking about portable oxygen tank order.  Reports that her O2 level has read 84 to 85% after exertion with a heart rate of 110.  She reports that her oxygen level recovers greater  than 90% after resting.  Is not currently on oxygen at home will need to be walked in office to qualify for POC. Repeat COVID testing was positive, patient reports that she still has lost her taste and smell with increased shortness of breath.   She is feeling a little better today. Family is checking on her. No other help at home. Reports feeling fine when sitting but her O2 drops as low as 85% on exertion. She is able to recover with rest. Taking prednisone taper as prescribed, feels it has helped her chest tightness. She has dry/hacking cough with no mucus production. She is eating and drinking ok. Denies fever.   04/09/2019 Patient presents today for 1 week follow-up. Unfortunately we were unable to get her set up with home health d/t her insurance. She is here today for qualifying O2 walk. She continues to have some shortness of breath and dry cough. Low energy. Her taste and smell as returned some. She is drinking plenty of fluids.  Reports that her cough is not quite as bad, states the more she talks she has to cough. Has trouble sleeping at night d/t her respiratory symptoms. Feels delsym and tessalon perles have been helpful. ONO on RA showed O2 low 70% with baseline 87%. Ambulatory O2 today showed O2 desaturation 84% on RA, requiring 3L. Using [facility]ero care for nebulizer supplies. Denies fever, weight loss, hemoptysis, purulent mucus.   No Known Allergies  Immunization History  Administered Date(s) Administered  . Influenza, Seasonal, Injecte, Preservative Fre 04/28/2008, 04/09/2010  . Influenza-Unspecified 06/01/2008, 05/10/2016, 03/18/2017  . Pneumococcal Polysaccharide-23 04/15/2015  .  Tdap 11/17/2009    Past Medical History:  Diagnosis Date  . Atherosclerosis [id number]   aorta, iliacs and CFA bilaterally no greater than 0-49% - lower arterial doppler.  . Chest pain   . COPD (chronic obstructive pulmonary disease) (HCC)     Tobacco History: Social History   Tobacco Use  Smoking  Status Former Smoker  . Types: Cigarettes  . Quit date: 03/18/2016  . Years since quitting: 3.0  Smokeless Tobacco Never Used   Counseling given: Not Answered   Outpatient Medications Prior to Visit  Medication Sig Dispense Refill  . amLODipine (NORVASC) 5 MG tablet Take 10 mg by mouth daily. Per patient on hold until her BP gets back to normal as of 03/19/19 office visit.    Marland Kitchen benzonatate (TESSALON) 200 MG capsule Take 1 capsule (200 mg total) by mouth 3 (three) times daily as needed for cough. 30 capsule 1  . budesonide (PULMICORT) 0.25 MG/2ML nebulizer solution Take 2 mLs (0.25 mg total) by nebulization 2 (two) times daily. 120 mL 2  . Cholecalciferol (VITAMIN D3) 50 MCG (2000 UT) capsule Take 1 capsule (2,000 Units total) by mouth daily. 30 capsule 1  . diclofenac sodium (VOLTAREN) 1 % GEL Apply 2 g topically 4 (four) times daily as needed (pain).     Marland Kitchen ipratropium-albuterol (DUONEB) 0.5-2.5 (3) MG/3ML SOLN 4 x daily and as neededDx J44.9 360 mL 2  . nortriptyline (PAMELOR) 25 MG capsule Take 25 mg by mouth at bedtime.    . predniSONE (DELTASONE) 10 MG tablet Take 2 tabs x 1 week; 1 tab x 1 week 21 tablet 0  . pregabalin (LYRICA) 50 MG capsule Take 50 mg by mouth 3 (three) times daily. Per patient on hold until her BP gets back to normal as of 03/19/19 office visit.    Marland Kitchen venlafaxine XR (EFFEXOR-XR) 75 MG 24 hr capsule Take 75 mg by mouth daily.    . VENTOLIN HFA 108 (90 Base) MCG/ACT inhaler Inhale 2 puffs into the lungs every 6 (six) hours as needed for shortness of breath.     No facility-administered medications prior to visit.     Review of Systems  Review of Systems  Constitutional: Positive for fatigue. Negative for fever.  HENT: Negative.   Respiratory: Positive for cough and shortness of breath. Negative for wheezing.   Cardiovascular: Negative.    Physical Exam  BP 128/70 (BP Location: Right Arm, Cuff Size: Normal)   Pulse 73   Ht 5\' 6"  (1.676 m)   Wt 187 lb 6.4 oz (85  kg)   SpO2 97%   BMI 30.25 kg/m  Physical Exam Constitutional:      Appearance: Normal appearance.     Comments: Appears fatigued  HENT:     Head: Normocephalic and atraumatic.     Right Ear: Tympanic membrane normal.     Left Ear: Tympanic membrane normal.     Mouth/Throat:     Mouth: Mucous membranes are moist.     Pharynx: Oropharynx is clear.  Neck:     Musculoskeletal: Normal range of motion and neck supple.  Cardiovascular:     Rate and Rhythm: Normal rate and regular rhythm.  Pulmonary:     Effort: Pulmonary effort is normal. No respiratory distress.     Breath sounds: No wheezing or rhonchi.     Comments: Mostly clear, slight rale right lower base Skin:    General: Skin is warm and dry.  Neurological:     General: No  focal deficit present.     Mental Status: She is alert and oriented to person, place, and time. Mental status is at baseline.  Psychiatric:        Mood and Affect: Mood normal.        Behavior: Behavior normal.        Thought Content: Thought content normal.        Judgment: Judgment normal.      Lab Results:  CBC    Component Value Date/Time   WBC 11.9 (H) 03/19/2019 0956   RBC 4.26 03/19/2019 0956   HGB 13.3 03/19/2019 0956   HCT 40.0 03/19/2019 0956   PLT 306.0 03/19/2019 0956   MCV 94.0 03/19/2019 0956   MCH 31.0 03/03/2019 0154   MCHC 33.3 03/19/2019 0956   RDW 13.7 03/19/2019 0956   LYMPHSABS 3.7 03/19/2019 0956   MONOABS 0.5 03/19/2019 0956   EOSABS 0.1 03/19/2019 0956   BASOSABS 0.1 03/19/2019 0956    BMET    Component Value Date/Time   NA 138 03/19/2019 0956   K 3.7 03/19/2019 0956   CL 99 03/19/2019 0956   CO2 28 03/19/2019 0956   GLUCOSE 80 03/19/2019 0956   BUN 17 03/19/2019 0956   CREATININE 0.80 03/19/2019 0956   CALCIUM 9.3 03/19/2019 0956   GFRNONAA >60 03/03/2019 0154   GFRAA >60 03/03/2019 0154    BNP No results found for: BNP  ProBNP No results found for: PROBNP  Imaging: No results found.    Assessment & Plan:   COPD (chronic obstructive pulmonary disease) (HCC) - Continue Pulmicort neb twice daily and Duoneb QID  COVID-19 virus infection - She is 42 days out since her first positive COVID test. Reports ongoing fatigue, dyspnea and dry cough. Afebrile. She is making slow progress but continues to improve a little each day - Qualified for home oxygen today, requiring 3L on exertion and at night  - HRCT scheduled for next week  Pneumonia due to COVID-19 virus - Lungs mostly clear on exam, faint rales at bases  - Referral to DME for incentive spirometer (5-10/hr) - Scheduled for HRCT on 9/29   Chronic respiratory failure with hypoxia ([place]) - Ambulatory walk showed desaturation to 85% on RA - Requiring 3L on exertion to keep O2>90% and 2-3 L at night  - Referral to [facility] for new oxygen start - Follow up in 2 weeks    Martyn Ehrich, NP 04/09/2019

## 2019-04-10 DIAGNOSIS — J1289 Other viral pneumonia: Secondary | ICD-10-CM | POA: Diagnosis not present

## 2019-04-10 DIAGNOSIS — J449 Chronic obstructive pulmonary disease, unspecified: Secondary | ICD-10-CM | POA: Diagnosis not present

## 2019-04-10 DIAGNOSIS — J9611 Chronic respiratory failure with hypoxia: Secondary | ICD-10-CM | POA: Diagnosis not present

## 2019-04-10 DIAGNOSIS — U071 COVID-19: Secondary | ICD-10-CM | POA: Diagnosis not present

## 2019-04-16 ENCOUNTER — Ambulatory Visit (INDEPENDENT_AMBULATORY_CARE_PROVIDER_SITE_OTHER)
Admission: RE | Admit: 2019-04-16 | Discharge: 2019-04-16 | Disposition: A | Discharge status: Home / Self Care | Payer: BC Managed Care – PPO | Source: Ambulatory Visit | Attending: Internal Medicine | Admitting: Internal Medicine

## 2019-04-16 ENCOUNTER — Other Ambulatory Visit: Payer: Self-pay

## 2019-04-16 ENCOUNTER — Inpatient Hospital Stay: Admission: RE | Admit: 2019-04-16 | Payer: BC Managed Care – PPO | Source: Ambulatory Visit

## 2019-04-16 DIAGNOSIS — J9809 Other diseases of bronchus, not elsewhere classified: Secondary | ICD-10-CM | POA: Diagnosis not present

## 2019-04-16 DIAGNOSIS — J432 Centrilobular emphysema: Secondary | ICD-10-CM | POA: Diagnosis not present

## 2019-04-16 DIAGNOSIS — R0602 Shortness of breath: Secondary | ICD-10-CM

## 2019-04-17 ENCOUNTER — Ambulatory Visit: Payer: BC Managed Care – PPO | Admitting: Primary Care

## 2019-04-22 ENCOUNTER — Encounter: Payer: Self-pay | Admitting: Primary Care

## 2019-04-22 ENCOUNTER — Telehealth: Payer: Self-pay | Admitting: Primary Care

## 2019-04-22 ENCOUNTER — Ambulatory Visit: Payer: BC Managed Care – PPO | Admitting: Primary Care

## 2019-04-22 ENCOUNTER — Encounter (HOSPITAL_COMMUNITY): Payer: Self-pay | Admitting: *Deleted

## 2019-04-22 ENCOUNTER — Other Ambulatory Visit: Payer: Self-pay

## 2019-04-22 ENCOUNTER — Telehealth (HOSPITAL_COMMUNITY): Payer: Self-pay

## 2019-04-22 VITALS — BP 118/76 | HR 99 | Ht 66.0 in | Wt 187.4 lb

## 2019-04-22 DIAGNOSIS — J1289 Other viral pneumonia: Secondary | ICD-10-CM

## 2019-04-22 DIAGNOSIS — J9611 Chronic respiratory failure with hypoxia: Secondary | ICD-10-CM

## 2019-04-22 DIAGNOSIS — J84112 Idiopathic pulmonary fibrosis: Secondary | ICD-10-CM

## 2019-04-22 DIAGNOSIS — Z23 Encounter for immunization: Secondary | ICD-10-CM

## 2019-04-22 DIAGNOSIS — J479 Bronchiectasis, uncomplicated: Secondary | ICD-10-CM | POA: Insufficient documentation

## 2019-04-22 DIAGNOSIS — J1282 Pneumonia due to coronavirus disease 2019: Secondary | ICD-10-CM

## 2019-04-22 DIAGNOSIS — J449 Chronic obstructive pulmonary disease, unspecified: Secondary | ICD-10-CM | POA: Diagnosis not present

## 2019-04-22 DIAGNOSIS — J441 Chronic obstructive pulmonary disease with (acute) exacerbation: Secondary | ICD-10-CM | POA: Diagnosis not present

## 2019-04-22 DIAGNOSIS — U071 COVID-19: Secondary | ICD-10-CM

## 2019-04-22 MED ORDER — FLUTTER DEVI: 0 refills | Status: AC

## 2019-04-22 NOTE — Telephone Encounter (Signed)
The facility calling (not listed in phone note) is pulmonary rehab. Attempted to call, but they are closed for the day.  wcb 11/21/2018

## 2019-04-22 NOTE — Assessment & Plan Note (Addendum)
-   Continue Pulmicort nebulizer twice daily and Duoneb QID  - Consider changing to inhaler form if doing well at next follow-up  - Repeat PFTs 3 months  - Received influenza vaccine today

## 2019-04-22 NOTE — Telephone Encounter (Signed)
Called [name] and spoke with [name] adv her the referral that was put in was for physical therapy and signed by NP, also we could not accept the Dx that was put in. [name] a new order would need to be put in with [name] signature and new DX. She stated she would have DR put new order in.

## 2019-04-22 NOTE — Telephone Encounter (Signed)
Called and spoke with pt in regards to PR, pt stated she would like to participate.

## 2019-04-22 NOTE — Patient Instructions (Addendum)
Mucinex twice daily as needed for chest congestion   Use Flutter valve every day 2-3 times a day  Continue nebulizer as prescribed  Follow up in 2 months with PFTs/ office visit with [name] only please   Refer to pulmonary rehab or physical therapy if not re: deconditioning/COPD    Bronchiectasis  Bronchiectasis is a condition in which the airways in the lungs (bronchi) are damaged and widened. The condition makes it hard for the lungs to get rid of mucus, and it causes mucus to gather in the bronchi. This condition often leads to lung infections, which can make the condition worse. What are the causes? You can be born with this condition or you can develop it later in life. Common causes of this condition include:  Cystic fibrosis.  Repeated lung infections, such as pneumonia or tuberculosis.  An object or other blockage in the lungs.  Breathing in fluid, food, or other objects (aspiration).  A problem with the immune system and lung structure that is present at birth (congenital). Sometimes the cause is not known. What are the signs or symptoms? Common symptoms of this condition include:  A daily cough that brings up mucus and lasts for more than 3 weeks.  Lung infections that happen often.  Shortness of breath and wheezing.  Weakness and fatigue. How is this diagnosed? This condition is diagnosed with tests, such as:  Chest X-rays or CT scans. These are done to check for changes in the lungs.  Breathing tests. These are done to check how well your lungs are working.  A test of a sample of your saliva (sputum culture). This test is done to check for infection.  Blood tests and other tests. These are done to check for related diseases or causes. How is this treated? Treatment for this condition depends on the severity of the illness and its cause. Treatment may include:  Medicines that loosen mucus so it can be coughed up (expectorants).  Medicines that  relax the muscles of the bronchi (bronchodilators).  Antibiotic medicines to prevent or treat infection.  Physical therapy to help clear mucus from the lungs. Techniques may include: ? Postural drainage. This is when you sit or lie in certain positions so that mucus can drain by gravity. ? Chest percussion. This involves tapping the chest or back with a cupped hand. ? Chest vibration. For this therapy, a hand or special equipment vibrates your chest and back.  Surgery to remove the affected part of the lung. This may be done in severe cases. Follow these instructions at home: Medicines  Take over-the-counter and prescription medicines only as told by your health care provider.  If you were prescribed an antibiotic medicine, take it as told by your health care provider. Do not stop taking the antibiotic even if you start to feel better.  Avoid taking sedatives and antihistamines unless your health care provider tells you to take them. These medicines tend to thicken the mucus in the lungs. Managing symptoms  Perform breathing exercises or techniques to clear your lungs as told by your health care provider.  Consider using a cold steam vaporizer or humidifier in your room or home to help loosen secretions.  If you have a cough that gets worse at night, try sleeping in a semi-upright position. General instructions  Get plenty of rest.  Drink enough fluid to keep your urine clear or pale yellow.  Stay inside when pollution and ozone levels are high.  Stay up to  date with vaccinations and immunizations.  Avoid cigarette smoke and other lung irritants.  Do not use any products that contain nicotine or tobacco, such as cigarettes and e-cigarettes. If you need help quitting, ask your health care provider.  Keep all follow-up visits as told by your health care provider. This is important. Contact a health care provider if:  You cough up more sputum than before and the sputum is  yellow or green in color.  You have a fever.  You cannot control your cough and are losing sleep. Get help right away if:  You cough up blood.  You have chest pain.  You have increasing shortness of breath.  You have pain that gets worse or is not controlled with medicines.  You have a fever and your symptoms suddenly get worse. Summary  Bronchiectasis is a condition in which the airways in the lungs (bronchi) are damaged and widened. The condition makes it hard for the lungs to get rid of mucus, and it causes mucus to gather in the bronchi.  Treatment usually includes therapy to help clear mucus from the lungs.  Stay up to date with vaccinations and immunizations. This information is not intended to replace advice given to you by your health care provider. Make sure you discuss any questions you have with your health care provider. Document Released: 05/01/2007 Document Revised: 06/16/2017 Document Reviewed: 08/08/2016 Elsevier Patient Education  2020 [employer].    Pulmonary Fibrosis  Pulmonary fibrosis is a type of lung disease that causes scarring. Over time, the scar tissue builds up in the air sacs of your lungs (alveoli). This makes it hard for you to breathe. Less oxygen can get into your blood. Scarring from pulmonary fibrosis gets worse over time. This damage is permanent and may lead to other serious health problems. What are the causes? There are many different causes of pulmonary fibrosis. Sometimes the cause is not known. This is called idiopathic pulmonary fibrosis. Other causes include:  Exposure to chemicals and substances found in agricultural, farm, Architect, or factory work. These include mold, asbestos, silica, metal dusts, and toxic fumes.  Sarcoidosis. In this disease, areas of inflammatory cells (granulomas) form and most often affect the lungs.  Autoimmune diseases. These include diseases such as rheumatoid arthritis, systemic sclerosis, or  connective tissue disease.  Taking certain medicines. These include drugs used in radiation therapy or used to treat seizures, heart problems, and some infections. What increases the risk? You are more likely to develop this condition if:  You have a family history of the disease.  You are older. The condition is more common in older adults.  You have a history of smoking.  You have a job that exposes you to certain chemicals.  You have gastroesophageal reflux disease (GERD). What are the signs or symptoms? Symptoms of this condition include:  Difficulty breathing that gets worse with activity.  Shortness of breath (dyspnea).  Dry, hacking cough.  Rapid, shallow breathing during exercise or while at rest.  Bluish skin and lips.  Loss of appetite.  Weakness.  Weight loss and fatigue.  Rounded and enlarged fingertips (clubbing). How is this diagnosed? This condition may be diagnosed based on:  Your symptoms and medical history.  A physical exam. You may also have tests, including:  A test that involves looking inside your lungs with an instrument (bronchoscopy).  Imaging studies of your lungs and heart.  Tests to measure how well you are breathing (pulmonary function tests).  Blood tests.  Tests to see how well your lungs work while you are walking (pulmonary stress test).  A procedure to remove a lung tissue sample to look at it under a microscope (biopsy). How is this treated? There is no cure for pulmonary fibrosis. Treatment focuses on managing symptoms and preventing scarring from getting worse. This may include:  Medicines, such as: ? Steroids to prevent permanent lung changes. ? Medicines to suppress your body's defense system (immune system). ? Medicines to help with lung function by reducing inflammation or scarring.  Ongoing monitoring with X-rays and lab work.  Oxygen therapy.  Pulmonary rehabilitation.  Surgery. In some cases, a lung  transplant is possible. Follow these instructions at home:     Medicines  Take over-the-counter and prescription medicines only as told by your health care provider.  Keep your vaccinations up to date as recommended by your health care provider. General instructions  Do not use any products that contain nicotine or tobacco, such as cigarettes and e-cigarettes. If you need help quitting, ask your health care provider.  Get regular exercise, but do not overexert yourself. Ask your health care provider to suggest some activities that are safe for you to do. ? If you have physical limitations, you may get exercise by walking, using a stationary bike, or doing chair exercises. ? Ask your health care provider about using oxygen while exercising.  If you are exposed to chemicals and substances at work, make sure that you wear a mask or respirator at all times.  Join a pulmonary rehabilitation program or a support group for people with pulmonary fibrosis.  Eat small meals often so you do not get too full. Overeating can make breathing trouble worse.  Maintain a healthy weight. Lose weight if you need to.  Do breathing exercises as directed by your health care provider.  Keep all follow-up visits as told by your health care provider. This is important. Contact a health care provider if you:  Have symptoms that do not get better with medicines.  Are not able to be as active as usual.  Have trouble taking a deep breath.  Have a fever or chills.  Have blue lips or skin.  Have clubbing of your fingers. Get help right away if you:  Have a sudden worsening of your symptoms.  Have chest pain.  Cough up mucus that is dark in color.  Have a lot of headaches.  Get very confused or sleepy. Summary  Pulmonary fibrosis is a type of lung disease that causes scar tissue to build up in the air sacs of your lungs (alveoli) over time. Less oxygen can get into your blood. This makes it  hard for you to breathe.  Scarring from pulmonary fibrosis gets worse over time. This damage is permanent and may lead to other serious health problems.  You are more likely to develop this condition if you have a family history of the condition or a job that exposes you to certain chemicals.  There is no cure for pulmonary fibrosis. Treatment focuses on managing symptoms and preventing scarring from getting worse. This information is not intended to replace advice given to you by your health care provider. Make sure you discuss any questions you have with your health care provider. Document Released: 09/24/2003 Document Revised: 08/09/2017 Document Reviewed: 08/09/2017 Elsevier Patient Education  2020 [employer].

## 2019-04-22 NOTE — Assessment & Plan Note (Deleted)
-   Resolved, no active symptoms

## 2019-04-22 NOTE — Assessment & Plan Note (Signed)
-   O2 stable at rest; requires 2-3 L on exertion - Recommending pulmonary rehab

## 2019-04-22 NOTE — Progress Notes (Signed)
@Patient  ID: Maria [name], female    DOB: May 31, 1956, 63 y.o.   MRN: [id number]  Chief Complaint  Patient presents with   Follow-up    sob-same,doing good with oxygen on 3L, tried walking around at home without gets to sob,cough-dry,Incentive spir. using,no fcs    Referring provider: Chelsea Primus, MD  HPI: 63 year old female, former smoker quit in 2017 (0-pack-year history).  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  Patient of [name], seen for initial consult/post COVID follow-up on 03/19/2019.  Admitted on February 26, 2019 with several day history of increased weakness, shortness of breath and nausea/diarrhea.  He was diagnosed with COVID-19 and spent a total of 5 days at [facility].  Chest x-ray on admission showed some basal infiltrates.  Treated with oxygen, Solu-Medrol and Advair.  During follow-up visit she reports feeling a lot more fatigued and reports feeling short of breath all the time.  She is maintained on Trelegy for COPD.  Not currently on oxygen.  Labs, HRCT, ONO and echocardiogram ordered.  Trelegy was stopped and she was started on Pulmicort nebulizer twice daily and DuoNeb's 4 times a day and as needed.  Ordered for new nebulizer machine with [facility]. Follow-up in 1 to 2 weeks after testing and needs flu shot during that visit.  Previous [facility] pulmonary encounters: 04/03/2019 Presents today for a 2-week follow-up.  Echocardiogram showed normal systolic function with estimated EF 60 to 65%.  Left ventricle diastolic doppler consistent with impaired relaxation-determinant filling pressures.  Right ventricle with normal systolic function, increased wall thickness-systolic pressure could not be assessed.  HRCT is scheduled for 9/29.  Patient called on 9/4 asking about portable oxygen tank order.  Reports that her O2 level has read 84 to  85% after exertion with a heart rate of 110.  She reports that her oxygen level recovers greater than 90% after resting.  Is not currently on oxygen at home will need to be walked in office to qualify for POC. Repeat COVID testing was positive, patient reports that she still has lost her taste and smell with increased shortness of breath.   She is feeling a little better today. Family is checking on her. No other help at home. Reports feeling fine when sitting but her O2 drops as low as 85% on exertion. She is able to recover with rest. Taking prednisone taper as prescribed, feels it has helped her chest tightness. She has dry/hacking cough with no mucus production. She is eating and drinking ok. Denies fever.   04/09/2019 Patient presents today for 1 week follow-up. Unfortunately we were unable to get her set up with home health d/t her insurance. She is here today for qualifying O2 walk. She continues to have some shortness of breath and dry cough. Low energy. Her taste and smell as returned some. She is drinking plenty of fluids. Reports that her cough is not quite as bad, states the more she talks she has to cough. Has trouble sleeping at night d/t her respiratory symptoms. Feels delsym and tessalon perles have been helpful. ONO on RA showed O2 low 70% with baseline 87%. Ambulatory O2 today showed O2 desaturation 84% on RA, requiring 3L. Using [facility] [facility] for nebulizer supplies. Denies fever, weight loss, hemoptysis, purulent mucus.   04/22/2019 Patient presents today for 2 week follow-up. Breathing is the same. Continues to have a dry cough. Completed  prednisone 2 days ago. Using incentive spirometry. She has tried to go without her oxygen at home but states that she gets too short winded. 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 Diffuse bronchial wall thickening with moderate  centrilobular and paraseptal emphysema; imaging findings suggestive of underlying COPD. She had COPD symptoms before getting COVID. Her breathing was getting a little worse before her diagnosis.  Sleeping better at night with oxygen. Ambulating with rolling walker, still deconditioned/weak. Due for influenza vaccine today.   Oxygen readings:  Resting O2 93% on RA  Ambulatory O2 98-100 on 3L Ambulatory O2 85% on RA  No Known Allergies  Immunization History  Administered Date(s) Administered   Influenza, Seasonal, Injecte, Preservative Fre 04/28/2008, 04/09/2010   Influenza,inj,Quad PF,6+ Mos 04/22/2019   Influenza-Unspecified 06/01/2008, 05/10/2016, 03/18/2017   Pneumococcal Polysaccharide-23 04/15/2015   Tdap 11/17/2009    Past Medical History:  Diagnosis Date   Atherosclerosis [id number]/0612014   aorta, iliacs and CFA bilaterally no greater than 0-49% - lower arterial doppler.   Chest pain    COPD (chronic obstructive pulmonary disease) (HCC)     Tobacco History: Social History   Tobacco Use  Smoking Status Former Smoker   Types: Cigarettes   Quit date: 03/18/2016   Years since quitting: 3.0  Smokeless Tobacco Never Used   Counseling given: Not Answered   Outpatient Medications Prior to Visit  Medication Sig Dispense Refill   amLODipine (NORVASC) 5 MG tablet Take 10 mg by mouth daily. Per patient on hold until her BP gets back to normal as of 03/19/19 office visit.     benzonatate (TESSALON) 200 MG capsule Take 1 capsule (200 mg total) by mouth 3 (three) times daily as needed for cough. 30 capsule 1   budesonide (PULMICORT) 0.25 MG/2ML nebulizer solution Take 2 mLs (0.25 mg total) by nebulization 2 (two) times daily. 120 mL 2   Cholecalciferol (VITAMIN D3) 50 MCG (2000 UT) capsule Take 1 capsule (2,000 Units total) by mouth daily. 30 capsule 1   diclofenac sodium (VOLTAREN) 1 % GEL Apply 2 g topically 4 (four) times daily as needed (pain).       ipratropium-albuterol (DUONEB) 0.5-2.5 (3) MG/3ML SOLN 4 x daily and as neededDx J44.9 360 mL 2   nortriptyline (PAMELOR) 25 MG capsule Take 25 mg by mouth at bedtime.     pregabalin (LYRICA) 50 MG capsule Take 50 mg by mouth 3 (three) times daily. Per patient on hold until her BP gets back to normal as of 03/19/19 office visit.     promethazine-codeine (PHENERGAN WITH CODEINE) 6.25-10 MG/5ML syrup Take 5 mLs by mouth every 8 (eight) hours as needed for cough. 200 mL 0   venlafaxine XR (EFFEXOR-XR) 75 MG 24 hr capsule Take 75 mg by mouth daily.     VENTOLIN HFA 108 (90 Base) MCG/ACT inhaler Inhale 2 puffs into the lungs every 6 (six) hours as needed for shortness of breath.     predniSONE (DELTASONE) 10 MG tablet Take 2 tabs x 1 week; 1 tab x 1 week (Patient not taking: Reported on 04/22/2019) 21 tablet 0   No facility-administered medications prior to visit.    Review of Systems  Review of Systems  Constitutional: Positive for fatigue.  HENT: Negative.   Respiratory: Positive for cough and shortness of breath. Negative for wheezing.   Cardiovascular: Negative.   Neurological: Positive for weakness.  Physical Exam  BP 118/76 (BP Location: Right Arm, Cuff Size: Normal)    Pulse 99    Ht 5\' 6"  (1.676 m)    Wt 187 lb 6.4 oz (85 kg)    SpO2 100%    BMI 30.25 kg/m  Physical Exam Constitutional:      Appearance: Normal appearance.  HENT:     Head: Normocephalic and atraumatic.  Neck:     Musculoskeletal: Normal range of motion and neck supple.  Cardiovascular:     Rate and Rhythm: Normal rate and regular rhythm.  Pulmonary:     Effort: Pulmonary effort is normal. No respiratory distress.     Comments: Fine crackles bilateral bases Musculoskeletal:     Comments: Ambulating with rolling walker  Skin:    General: Skin is warm and dry.  Neurological:     General: No focal deficit present.     Mental Status: She is alert and oriented to person, place, and time. Mental status is at  baseline.  Psychiatric:        Mood and Affect: Mood normal.        Behavior: Behavior normal.        Thought Content: Thought content normal.        Judgment: Judgment normal.      Lab Results:  CBC    Component Value Date/Time   WBC 11.9 (H) 03/19/2019 0956   RBC 4.26 03/19/2019 0956   HGB 13.3 03/19/2019 0956   HCT 40.0 03/19/2019 0956   PLT 306.0 03/19/2019 0956   MCV 94.0 03/19/2019 0956   MCH 31.0 03/03/2019 0154   MCHC 33.3 03/19/2019 0956   RDW 13.7 03/19/2019 0956   LYMPHSABS 3.7 03/19/2019 0956   MONOABS 0.5 03/19/2019 0956   EOSABS 0.1 03/19/2019 0956   BASOSABS 0.1 03/19/2019 0956    BMET    Component Value Date/Time   NA 138 03/19/2019 0956   K 3.7 03/19/2019 0956   CL 99 03/19/2019 0956   CO2 28 03/19/2019 0956   GLUCOSE 80 03/19/2019 0956   BUN 17 03/19/2019 0956   CREATININE 0.80 03/19/2019 0956   CALCIUM 9.3 03/19/2019 0956   GFRNONAA >60 03/03/2019 0154   GFRAA >60 03/03/2019 0154    BNP No results found for: BNP  ProBNP No results found for: PROBNP  Imaging: Ct Chest High Resolution  Result Date: 04/16/2019 CLINICAL DATA:  63 year old female with history of shortness of breath. EXAM: CT CHEST WITHOUT CONTRAST TECHNIQUE: Multidetector CT imaging of the chest was performed following the standard protocol without intravenous contrast. High resolution imaging of the lungs, as well as inspiratory and expiratory imaging, was performed. COMPARISON:  Chest CT 10/02/2008. FINDINGS: Cardiovascular: Heart size is normal. There is no significant pericardial fluid, thickening or pericardial calcification. There is aortic atherosclerosis, as well as atherosclerosis of the great vessels of the mediastinum and the coronary arteries, including calcified atherosclerotic plaque in the right coronary artery. Mediastinum/Nodes: No pathologically enlarged mediastinal or hilar lymph nodes. Please note that accurate exclusion of hilar adenopathy is limited on  noncontrast CT scans. Esophagus is unremarkable in appearance. No axillary lymphadenopathy. Lungs/Pleura: Diffuse bronchial wall thickening with moderate centrilobular and paraseptal emphysema. High-resolution images also demonstrate areas of septal thickening, thickening of the peribronchovascular interstitium and mild cylindrical bronchiectasis in the lower lobes of the lungs bilaterally (right greater than left), where there is also some regional architectural distortion. No honeycombing. Similar findings are present to a lesser degree in the posterior aspect of  the right upper lobe. Inspiratory and expiratory imaging is unremarkable. No acute consolidative airspace disease. No pleural effusions. No suspicious appearing pulmonary nodules or masses are noted. Upper Abdomen: Aortic atherosclerosis.  Status post cholecystectomy. Musculoskeletal: Orthopedic fixation hardware in the lower cervical spine incidentally noted. There are no aggressive appearing lytic or blastic lesions noted in the visualized portions of the skeleton. IMPRESSION: 1. 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 Given the presence of some bronchiectasis, this is technically characterized as probable UIP per current ATS guidelines, however, that is not favored. If there is persistent clinical concern for interstitial lung disease, repeat high-resolution chest CT would be recommended in 12 months to assess for temporal changes in the appearance of the lung parenchyma. 2. Diffuse bronchial wall thickening with moderate centrilobular and paraseptal emphysema; imaging findings suggestive of underlying COPD. 3. Aortic atherosclerosis, in addition to right coronary artery disease. Please note that although the presence of coronary artery calcium documents the presence of coronary artery disease, the severity of this  disease and any potential stenosis cannot be assessed on this non-gated CT examination. Assessment for potential risk factor modification, dietary therapy or pharmacologic therapy may be warranted, if clinically indicated. Aortic Atherosclerosis (ICD10-I70.0) and Emphysema (ICD10-J43.9). Electronically Signed   By: Vinnie Langton M.D.   On: 04/16/2019 13:12     Assessment & Plan:   COVID-19 virus infection - Stable; She is 55 days out since first testing positive for COVID. 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   Pneumonia due to COVID-19 virus - O2 stable at rest; requires 2-3 L on exertion - Recommending pulmonary rehab   COPD (chronic obstructive pulmonary disease) (HCC) - Continue Pulmicort nebulizer twice daily and Duoneb QID  - Consider changing to inhaler form if doing well at next follow-up  - Repeat PFTs 3 months  - Received influenza vaccine today   Bronchiectasis without complication (HCC) - Dry cough, no mucus production - PRN mucinex 600mg  twice daily  - Flutter 2-3 times a day    Martyn Ehrich, NP 04/22/2019

## 2019-04-22 NOTE — Assessment & Plan Note (Deleted)
-   O2 stable at rest; requires 2-3 L on exertion - Recommending pulmonary rehab

## 2019-04-22 NOTE — Progress Notes (Signed)
Conferred with Dr. Nelda Marseille Pulmonary Rehab Medical Director regarding pt appropriateness to proceed with group exercise.  Pt with known history and hospitalization for Covid on 8/11-8/16.  Pt with onset of symptoms on 8/10.Marland Kitchen  Pt tested positive with subsequent testing on 9/8.  Pt with lingering symptoms associated with covid.  Shortness of breath continues, dependent on oxygen therapy, fatigue, cough and some improvement in taste and smell.  Dr. Glorious Peach feels retestest is necessary and warranted.  Pt  will need a negative test prior to moving forward with scheduling.  Pt seen by Geraldo Pitter Np with [name] notified of this request.  Will hold for now for scheduling as we await results from the test and new referral signed by MD. .Maurice Small RN, BSN Cardiac and Pulmonary Rehab Nurse Navigator

## 2019-04-22 NOTE — Assessment & Plan Note (Addendum)
-   Stable; She is 55 days out since first testing positive for COVID. 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

## 2019-04-22 NOTE — Assessment & Plan Note (Signed)
-   Dry cough, no mucus production - PRN mucinex 600mg  twice daily  - Flutter 2-3 times a day

## 2019-04-23 ENCOUNTER — Telehealth: Payer: Self-pay | Admitting: Primary Care

## 2019-04-23 DIAGNOSIS — J449 Chronic obstructive pulmonary disease, unspecified: Secondary | ICD-10-CM | POA: Diagnosis not present

## 2019-04-23 NOTE — Telephone Encounter (Signed)
Immunization records has been printed and faxed to the number the pt provided in her email. Nothing further needed at this time.

## 2019-04-23 NOTE — Telephone Encounter (Signed)
Please see referral to pulmonary rehab, there is documentation stating that this order has been placed on hold at this time. Message will be closed.

## 2019-04-23 NOTE — Telephone Encounter (Signed)
Pulmonary rehab needs negative test before working with patient. Please order and let patient know.

## 2019-04-26 NOTE — Telephone Encounter (Signed)
Can you schedule this for pt? 

## 2019-04-26 NOTE — Telephone Encounter (Signed)
Talked with Jonelle Sidle about this.

## 2019-05-01 NOTE — Telephone Encounter (Signed)
Spoke with pt, she is going to wait until 05/29/2019 to have another Covid test because MR advised her that she would test positive for 90 days. The referral looks like its on hold until then. Nothing further is needed.

## 2019-05-09 DIAGNOSIS — J449 Chronic obstructive pulmonary disease, unspecified: Secondary | ICD-10-CM | POA: Diagnosis not present

## 2019-05-24 ENCOUNTER — Other Ambulatory Visit: Payer: Self-pay | Admitting: Primary Care

## 2019-05-24 DIAGNOSIS — E559 Vitamin D deficiency, unspecified: Secondary | ICD-10-CM

## 2019-05-24 DIAGNOSIS — J449 Chronic obstructive pulmonary disease, unspecified: Secondary | ICD-10-CM | POA: Diagnosis not present

## 2019-05-29 NOTE — Telephone Encounter (Signed)
Yes that is fine, sent Needs vit d level check, lab ordered

## 2019-05-29 NOTE — Telephone Encounter (Signed)
Called and spoke with pt letting her know Beth refilled med and stated to her that we needed her to come by office to have vit d level rechecked. Pt verbalized understanding and said she would try to come by office tomorrow. Nothing further needed.

## 2019-05-29 NOTE — Telephone Encounter (Signed)
Beth, please advise if it is okay to refill med for pt or if it needs to be sent to pcp.

## 2019-06-03 ENCOUNTER — Other Ambulatory Visit (INDEPENDENT_AMBULATORY_CARE_PROVIDER_SITE_OTHER): Payer: BC Managed Care – PPO

## 2019-06-03 DIAGNOSIS — E559 Vitamin D deficiency, unspecified: Secondary | ICD-10-CM | POA: Diagnosis not present

## 2019-06-04 LAB — VITAMIN D 25 HYDROXY (VIT D DEFICIENCY, FRACTURES): VITD: 35.22 ng/mL (ref 30.00–100.00)

## 2019-06-04 NOTE — Progress Notes (Signed)
Please let patient know vit d level is normal. I believe I refilled medication, recommend she follow up with PCP for further refills/management

## 2019-06-05 DIAGNOSIS — M25552 Pain in left hip: Secondary | ICD-10-CM | POA: Diagnosis not present

## 2019-06-05 DIAGNOSIS — J9611 Chronic respiratory failure with hypoxia: Secondary | ICD-10-CM | POA: Diagnosis not present

## 2019-06-05 DIAGNOSIS — G8929 Other chronic pain: Secondary | ICD-10-CM | POA: Diagnosis not present

## 2019-06-05 DIAGNOSIS — I10 Essential (primary) hypertension: Secondary | ICD-10-CM | POA: Diagnosis not present

## 2019-06-05 NOTE — Progress Notes (Signed)
LMOMTCB x 1 

## 2019-06-09 DIAGNOSIS — J449 Chronic obstructive pulmonary disease, unspecified: Secondary | ICD-10-CM | POA: Diagnosis not present

## 2019-06-11 NOTE — Progress Notes (Signed)
LMOM TCB x2

## 2019-06-12 DIAGNOSIS — Z1231 Encounter for screening mammogram for malignant neoplasm of breast: Secondary | ICD-10-CM | POA: Diagnosis not present

## 2019-06-12 DIAGNOSIS — Z006 Encounter for examination for normal comparison and control in clinical research program: Secondary | ICD-10-CM | POA: Diagnosis not present

## 2019-06-12 LAB — HM MAMMOGRAPHY

## 2019-06-18 ENCOUNTER — Telehealth: Payer: Self-pay | Admitting: Internal Medicine

## 2019-06-18 ENCOUNTER — Telehealth: Payer: Self-pay

## 2019-06-18 NOTE — Telephone Encounter (Signed)
Error

## 2019-06-18 NOTE — Telephone Encounter (Signed)
Called to schedule COVID test for pt for 12/7 PFT.  Pt returned my call & states that she gets tested at work & wants to know if she can use the COVID test from her work instead of getting tested by a [facility] facility.  Please advise.  Pt can be reached from 7:30-5 at [phone] or after 5 on her cell # [phone]

## 2019-06-18 NOTE — Telephone Encounter (Signed)
LMTCB she will need to be tested by a [facility] facility.

## 2019-06-19 NOTE — Telephone Encounter (Signed)
Patient is returning [place] phone call to schedule Covid 19 test.  Would like to go to [facility] on 06/21/2019.  Patient phone number is [phone].

## 2019-06-20 NOTE — Telephone Encounter (Signed)
Spoke to pt & gave her appt info.  Nothing further needed. 

## 2019-06-20 NOTE — Telephone Encounter (Signed)
I have pt scheduled at [facility] on 12/4.  Per Caryl Pina pt is to come from 8-10:30.  Left vm for pt to call me back for appt info.

## 2019-06-21 ENCOUNTER — Other Ambulatory Visit
Admission: RE | Admit: 2019-06-21 | Discharge: 2019-06-21 | Disposition: A | Discharge status: Home / Self Care | Payer: BC Managed Care – PPO | Source: Ambulatory Visit | Attending: Primary Care | Admitting: Primary Care

## 2019-06-21 DIAGNOSIS — Z20828 Contact with and (suspected) exposure to other viral communicable diseases: Secondary | ICD-10-CM | POA: Insufficient documentation

## 2019-06-21 DIAGNOSIS — Z01812 Encounter for preprocedural laboratory examination: Secondary | ICD-10-CM | POA: Diagnosis not present

## 2019-06-21 LAB — SARS CORONAVIRUS 2 (TAT 6-24 HRS): SARS Coronavirus 2: NEGATIVE

## 2019-06-23 DIAGNOSIS — J449 Chronic obstructive pulmonary disease, unspecified: Secondary | ICD-10-CM | POA: Diagnosis not present

## 2019-06-24 ENCOUNTER — Ambulatory Visit (INDEPENDENT_AMBULATORY_CARE_PROVIDER_SITE_OTHER): Payer: BC Managed Care – PPO | Admitting: Internal Medicine

## 2019-06-24 ENCOUNTER — Other Ambulatory Visit: Payer: Self-pay

## 2019-06-24 DIAGNOSIS — J84112 Idiopathic pulmonary fibrosis: Secondary | ICD-10-CM

## 2019-06-24 LAB — PULMONARY FUNCTION TEST
DL/VA % pred: 60 %
DL/VA: 2.51 ml/min/mmHg/L
DLCO unc % pred: 57 %
DLCO unc: 12.24 ml/min/mmHg
FEF 25-75 Post: 1.25 L/sec
FEF 25-75 Pre: 0.61 L/sec
FEF2575-%Change-Post: 104 %
FEF2575-%Pred-Post: 52 %
FEF2575-%Pred-Pre: 25 %
FEV1-%Change-Post: 21 %
FEV1-%Pred-Post: 73 %
FEV1-%Pred-Pre: 60 %
FEV1-Post: 1.99 L
FEV1-Pre: 1.64 L
FEV1FVC-%Change-Post: 4 %
FEV1FVC-%Pred-Pre: 72 %
FEV6-%Change-Post: 18 %
FEV6-%Pred-Post: 98 %
FEV6-%Pred-Pre: 83 %
FEV6-Post: 3.32 L
FEV6-Pre: 2.81 L
FEV6FVC-%Change-Post: 2 %
FEV6FVC-%Pred-Post: 102 %
FEV6FVC-%Pred-Pre: 99 %
FVC-%Change-Post: 15 %
FVC-%Pred-Post: 96 %
FVC-%Pred-Pre: 83 %
FVC-Post: 3.39 L
FVC-Pre: 2.93 L
Post FEV1/FVC ratio: 59 %
Post FEV6/FVC ratio: 98 %
Pre FEV1/FVC ratio: 56 %
Pre FEV6/FVC Ratio: 96 %
RV % pred: 110 %
RV: 2.35 L
TLC % pred: 108 %
TLC: 5.81 L

## 2019-06-24 NOTE — Progress Notes (Signed)
PFT done today. 

## 2019-07-02 ENCOUNTER — Telehealth: Payer: Self-pay | Admitting: Internal Medicine

## 2019-07-02 NOTE — Telephone Encounter (Signed)
Pt had the covid test performed due to it needing to be done prior to PFT which pt had done 06/24/19.  Called and spoke with pt in regards to this and asked pt why she had to get tested today. 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 Pt said she has no symptoms.  Spoke with Aaron Edelman in regards to this and he said with pt not having any symptoms and this being required for her job, she is still fine to come to office. Stated this to pt and she verbalized understanding. Nothing further needed.

## 2019-07-03 ENCOUNTER — Encounter: Payer: Self-pay | Admitting: Internal Medicine

## 2019-07-03 ENCOUNTER — Encounter (HOSPITAL_COMMUNITY): Payer: Self-pay | Admitting: *Deleted

## 2019-07-03 ENCOUNTER — Ambulatory Visit: Payer: BC Managed Care – PPO | Admitting: Internal Medicine

## 2019-07-03 ENCOUNTER — Other Ambulatory Visit: Payer: Self-pay

## 2019-07-03 VITALS — BP 118/70 | HR 83 | Ht 66.0 in | Wt 184.8 lb

## 2019-07-03 DIAGNOSIS — J849 Interstitial pulmonary disease, unspecified: Secondary | ICD-10-CM | POA: Diagnosis not present

## 2019-07-03 DIAGNOSIS — Z8619 Personal history of other infectious and parasitic diseases: Secondary | ICD-10-CM

## 2019-07-03 DIAGNOSIS — Z8616 Personal history of COVID-19: Secondary | ICD-10-CM

## 2019-07-03 DIAGNOSIS — J439 Emphysema, unspecified: Secondary | ICD-10-CM

## 2019-07-03 DIAGNOSIS — J8410 Pulmonary fibrosis, unspecified: Secondary | ICD-10-CM | POA: Diagnosis not present

## 2019-07-03 LAB — SEDIMENTATION RATE: Sed Rate: 41 mm/hr — ABNORMAL HIGH (ref 0–30)

## 2019-07-03 NOTE — Progress Notes (Signed)
Received second referral from [name] for this pt to participate in Pulmonary with the diagnosis of ILD.  Pt was referred to [facility] in October, however she had two positive COVID test along with complaint of symptoms that were concerning for continued COVID.  Referred with Dr. Nelda Marseille, Medical Director of [facility] Pulmonary Rehab, who advised [name] to wait until pt had a negative test before engaging in group exercise. Pt was advised and wished to wait until she completed scheduled PFT which required pre testing for Covid. Clinical review of pt follow up appt on 12/16  Pulmonary office note.  Pt with Covid Risk Score - 2.  Pt appropriate for scheduling for Pulmonary rehab with noted weakness and fatigue.  Will forward to support staff for scheduling and verification of insurance eligibility/benefits with pt consent. Cherre Huger, BSN Cardiac and Training and development officer

## 2019-07-03 NOTE — Progress Notes (Signed)
OV 03/19/2019  Subjective:  Patient ID: Maria [name], female , DOB: 1955/07/31 , age 63 y.o. , MRN: [id number] , ADDRESS: [place] [id number]   03/19/2019 -   Chief Complaint  Patient presents with  . Hospitalization Follow-up    Post covid symptoms   Post COVID  follow-up in the pulmonary office  HPI Maria [name] 63 y.o. 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  And also poor intake.  Was diagnosed to have acute COVID-19.  She remembers being on oxygen.  At the time prior to admission she is had chronic hip pain based on her history and review of the records for which she was using a cane.  She spent a total of 5 days at [facility] and was discharged on March 03, 2019.  She was treated with oxygen, Solu-Medrol and REM does Advair.  Last set of laboratories reviewed on March 03, 2019 which showed that she had normal hemoglobin and creatinine and his CRP had improved.  The only chest x-ray that was available is 1 on admission in February 26, 2019 but she has some basal infiltrates.  She now presents to the pulmonary clinic on March 19, 2019.  It is 21 days since admission and she is over 21 days since her first set of symptoms.  She tells me that she is a lot more fatigue.  She is so fatigued that she is not even able to take her inhaler properly which is Trelegy for her COPD.  She feels short of breath all the time.  She is requiring a lot of help taking a shower.  Her hip pain continues.  She is now needing the use of a walker.  Her ECOG is 4.  Her appetite is good.  She is currently not using oxygen either at night or in the daytime.        04/03/2019 Presents today for a  2-week follow-up.  Echocardiogram showed normal systolic function with estimated EF 60 to 65%.  Left ventricle diastolic doppler consistent with impaired relaxation-determinant filling pressures.  Right ventricle with normal systolic function, increased wall thickness-systolic pressure could not be assessed.  HRCT is scheduled for 9/29.  Patient called on 9/4 asking about portable oxygen tank order.  Reports that her O2 level has read 84 to 85% after exertion with a heart rate of 110.  She reports that her oxygen level recovers greater than 90% after resting.  Is not currently on oxygen at home will need to be walked in office to qualify for POC. Repeat COVID testing was positive, patient reports that she still has lost her taste and smell with increased shortness of breath.   She is feeling a little better today. Family is checking on her. No other help at home. Reports feeling fine when sitting but her O2 drops as low as 85% on exertion. She is able to recover with rest. Taking prednisone taper as prescribed, feels it has helped her chest tightness. She has dry/hacking cough with no mucus production. She is eating and drinking ok. Denies fever.   04/09/2019 Patient presents today for 1 week follow-up. Unfortunately we were unable to get her set up with home health d/t  her insurance. She is here today for qualifying O2 walk. She continues to have some shortness of breath and dry cough. Low energy. Her taste and smell as returned some. She is drinking plenty of fluids. Reports that her cough is not quite as bad, states the more she talks she has to cough. Has trouble sleeping at night d/t her respiratory symptoms. Feels delsym and tessalon perles have been helpful. ONO on RA showed O2 low 70% with baseline 87%. Ambulatory O2 today showed O2 desaturation 84% on RA, requiring 3L. Using [employer] for nebulizer supplies. Denies fever, weight loss, hemoptysis, purulent mucus.   04/22/2019 Patient presents today  for 2 week follow-up. Breathing is the same. Continues to have a dry cough. Completed prednisone 2 days ago. Using incentive spirometry. She has tried to go without her oxygen at home but states that she gets too short winded. 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 Diffuse bronchial wall thickening with moderate centrilobular and paraseptal emphysema; imaging findings suggestive of underlying COPD. She had COPD symptoms before getting COVID. Her breathing was getting a little worse before her diagnosis.  Sleeping better at night with oxygen. Ambulating with rolling walker, still deconditioned/weak. Due for influenza vaccine today.   Oxygen readings:  Resting O2 93% on RA  Ambulatory O2 98-100 on 3L Ambulatory O2 85% on RA  OV 07/03/2019  Subjective:  Patient ID: Maria [name], female , DOB: 28-Mar-1956 , age 63 y.o. , MRN: [id number] , ADDRESS: [place] [id number]   07/03/2019 -   Chief Complaint  Patient presents with  . Follow-up    PFT performed 12/7 and pt is here following up after that. Pt states she is still becoming SOB with exertion.  Follow-up emphysema Follow-up interstitial lung disease post COVID-19 in August 2020 Follow-up chronic hypoxemic respiratory failure secondary to both   HPI Maria [name] 63 y.o. -presents for the above issues.  She says her condition is returned to normal.  She is back working as a Forensic psychologist.  However she continues to be still significantly symptomatic.  She had a pulmonary function test June 24, 2019.  I visualized the result.  Shows isolated reduction in diffusion capacity was only moderate at 57%.  FVC and FEV1 are normal.  She continues to be significantly symptomatic as documented below.  She is taking Trelegy.  She says she desaturates to 85% with exertion echocardiogram recently was  normal.  She is asking if she should take the Moderna COVID-19 vaccine available in January 2021  SYMPTOM SCALE - ILD 07/03/2019   O2 use 2L witg exertion  Shortness of Breath 0 -> 5 scale with 5 being worst (score 6 If unable to do)  At rest 2  Simple tasks - showers, clothes change, eating, shaving 2  Household (dishes, doing bed, laundry) 4.54  Shopping 5  Walking level at own pace 5  Walking keeping up with others of same age 51  Walking up Stairs 5  Walking up [place] 5  Total (40 - 48) Dyspnea Score 33.5  How bad is your cough? moderte  How bad is your fatigue moderate        IMPRESSION: CT chest 04/16/2019 1. 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  Given the presence of some bronchiectasis, this is technically characterized as probable UIP per current ATS guidelines, however, that is not favored. If there is persistent clinical concern for interstitial lung disease, repeat high-resolution chest CT would be recommended in 12 months to assess for temporal changes in the appearance of the lung parenchyma. 2. Diffuse bronchial wall thickening with moderate centrilobular and paraseptal emphysema; imaging findings suggestive of underlying COPD. 3. Aortic atherosclerosis, in addition to right coronary artery disease. Please note that although the presence of coronary artery calcium documents the presence of coronary artery disease, the severity of this disease and any potential stenosis cannot be assessed on this non-gated CT examination. Assessment for potential risk factor modification, dietary therapy or pharmacologic therapy may be warranted, if clinically indicated.  Aortic Atherosclerosis (ICD10-I70.0) and Emphysema (ICD10-J43.9).   Electronically Signed   By: Vinnie Langton M.D.   On: 04/16/2019 13:12  ROS - per HPI      has a past medical history of Atherosclerosis (5/0932671), Chest pain, and COPD (chronic obstructive pulmonary disease) ([place]).   reports that she quit smoking about 3 years ago. Her smoking use included cigarettes. She has never used smokeless tobacco.  Past Surgical History:  Procedure Laterality Date  . ABDOMINAL HYSTERECTOMY  1978  . NECK SURGERY  1999  . OVARIAN CYST REMOVAL  1988    No Known Allergies  Immunization History  Administered Date(s) Administered  . Influenza, Seasonal, Injecte, Preservative Fre 04/28/2008, 04/09/2010  . Influenza,inj,Quad PF,6+ Mos 04/22/2019  . Influenza-Unspecified 06/01/2008, 05/10/2016, 03/18/2017  . Pneumococcal Polysaccharide-23 04/15/2015  . Tdap 11/17/2009    Family History  Problem Relation Age of Onset  . Heart failure Mother   . Diabetes Mother   . Kidney disease Mother   . Hypertension Mother   . COPD Father   . Cancer Brother        lung  . Diabetes Daughter      Current Outpatient Medications:  .  benzonatate (TESSALON) 200 MG capsule, Take 1 capsule (200 mg total) by mouth 3 (three) times daily as needed for cough., Disp: 30 capsule, Rfl: 1 .  budesonide (PULMICORT) 0.25 MG/2ML nebulizer solution, Take 2 mLs (0.25 mg total) by nebulization 2 (two) times daily., Disp: 120 mL, Rfl: 2 .  diclofenac sodium (VOLTAREN) 1 % GEL, Apply 2 g topically 4 (four) times daily as needed (pain). , Disp: , Rfl:  .  ipratropium-albuterol (DUONEB) 0.5-2.5 (3) MG/3ML SOLN, 4 x daily and as neededDx J44.9, Disp: 360 mL, Rfl: 2 .  nortriptyline (PAMELOR) 25 MG capsule, Take 25 mg by mouth at bedtime., Disp: , Rfl:  .  promethazine-codeine (PHENERGAN WITH CODEINE) 6.25-10 MG/5ML syrup, Take 5 mLs by mouth every 8 (eight) hours as needed for cough., Disp: 200 mL, Rfl: 0 .  Respiratory Therapy Supplies (FLUTTER) DEVI, Use as directed, Disp: 1 each, Rfl: 0 .  VENTOLIN HFA 108 (90 Base) MCG/ACT inhaler, Inhale 2 puffs into the lungs every 6 (six)  hours as needed for shortness of breath., Disp: , Rfl:  .  amLODipine (NORVASC) 5 MG tablet, Take 10 mg by mouth daily. Per patient on hold until her BP gets back to normal as of 03/19/19 office visit., Disp: , Rfl:  .  Cholecalciferol (VITAMIN D3) 50 MCG (2000 UT) capsule, TAKE 1 CAPSULE BY MOUTH EVERY DAY, Disp: 30 capsule, Rfl: 1 .  Fluticasone-Umeclidin-Vilant (TRELEGY ELLIPTA) 100-62.5-25 MCG/INH AEPB, INHALE 1 PUFF ONCE DAILY, Disp: , Rfl:  .  pregabalin (LYRICA)  50 MG capsule, Take 50 mg by mouth 3 (three) times daily. Per patient on hold until her BP gets back to normal as of 03/19/19 office visit., Disp: , Rfl:  .  venlafaxine XR (EFFEXOR-XR) 75 MG 24 hr capsule, Take 75 mg by mouth daily., Disp: , Rfl:       Objective:   Vitals:   07/03/19 0846  BP: 118/70  Pulse: 83  SpO2: 98%  Weight: 184 lb 12.8 oz (83.8 kg)  Height: _0  (1.676 m)    Estimated body mass index is 29.83 kg/m as calculated from the following:   Height as of this encounter: _1  (1.676 m).   Weight as of this encounter: 184 lb 12.8 oz (83.8 kg).  _2 @  [employer]   07/03/19 0846  Weight: 184 lb 12.8 oz (83.8 kg)     Physical Exam  General Appearance:    Alert, cooperative, no distress, appears stated age - yes , Deconditioned looking - no , OBESE  - no, Sitting on Wheelchair -  no  Head:    Normocephalic, without obvious abnormality, atraumatic  Eyes:    PERRL, conjunctiva/corneas clear,  Ears:    Normal TM's and external ear canals, both ears  Nose:   Nares normal, septum midline, mucosa normal, no drainage    or sinus tenderness. OXYGEN ON  - yes . Patient is @ 2L   Throat:   Lips, mucosa, and tongue normal; teeth and gums normal. Cyanosis on lips - no  Neck:   Supple, symmetrical, trachea midline, no adenopathy;    thyroid:  no enlargement/tenderness/nodules; no carotid   bruit or JVD  Back:     Symmetric, no curvature, ROM normal, no CVA tenderness  Lungs:     Distress - no ,  Wheeze no, Barrell Chest - no, Purse lip breathing - no, Crackles - no   Chest Wall:    No tenderness or deformity.    Heart:    Regular rate and rhythm, S1 and S2 normal, no rub   or gallop, Murmur - no  Breast Exam:    NOT DONE  Abdomen:     Soft, non-tender, bowel sounds active all four quadrants,    no masses, no organomegaly. Visceral obesity - yes  Genitalia:   NOT DONE  Rectal:   NOT DONE  Extremities:   Extremities - normal, Has Cane - no, Clubbing - no, Edema - no  Pulses:   2+ and symmetric all extremities  Skin:   Stigmata of Connective Tissue Disease - no  Lymph nodes:   Cervical, supraclavicular, and axillary nodes normal  Psychiatric:  Neurologic:   Pleasant - yes, Anxious - no, Flat affect - no  CAm-ICU - neg, Alert and Oriented x 3 - yes, Moves all 4s - yes, Speech - normal, Cognition - intact           Assessment:       ICD-10-CM   1. History of 2019 novel coronavirus disease (COVID-19)  Z86.19   2. Pulmonary fibrosis, postinflammatory (HCC)  J84.10   3. ILD (interstitial lung disease) ([place])  J84.9   4. Pulmonary emphysema, unspecified emphysema type ([place])  J43.9        Plan:     Patient Instructions     ICD-10-CM   1. History of 2019 novel coronavirus disease (COVID-19)  Z86.19   2. Pulmonary fibrosis, postinflammatory (HCC)  J84.10   3. ILD (interstitial lung disease) ([place])  J84.9  4. Pulmonary emphysema, unspecified emphysema type ([place])  J43.9      Covid-19 vaccine: tough to say if vaccine will benefit you. Ultimately is a personal choice. On balance I think you will gain benefit if you take it 4 or 6 months after your first covid-19 vaccine  ILD  - do Serum: ESR, ACE, ANA, DS-DNA, RF, anti-CCP,  Total CK,  Aldolase,  SsA, ssb, scl-70, HP panel - if these are abnormal we can consider steroid treatment  - natural course of post covid-19 fibrosis is not known  - if getting worse then we can cosndier non-steroidal anti-fibrotics - refer pulmnary  rehab  Emphysema  - check alpha 1 07/03/2019   - continue trelegy scheduled and nebulizer prn  Chronic resp failuyre  - continue o2 with exertiona - do repeat spirometry/dlco in march/april 2021  Followup  ILD clinic or 30 minute slot in march / April 2021 after breathing test    > 50% of this > 25 min visit spent in  face to face counseling or coordination of care - by this undersigned MD - Dr Brand Males. This includes one or more of the following documented above: discussion of test results, diagnostic or treatment recommendations, prognosis, risks and benefits of management options, instructions, education, compliance or risk-factor reduction    SIGNATURE    Dr. Brand Males, M.D., F.C.C.P,  Pulmonary and Critical Care Medicine Staff Physician, [place] Director - Interstitial Lung Disease  Program  Pulmonary [facility] [place], [place], [address]  Pager: [phone], If no answer or between  15:00h - 7:00h: call [phone] Telephone: [phone]  9:26 AM 07/03/2019

## 2019-07-03 NOTE — Patient Instructions (Addendum)
ICD-10-CM   1. History of 2019 novel coronavirus disease (COVID-19)  Z86.19   2. Pulmonary fibrosis, postinflammatory (HCC)  J84.10   3. ILD (interstitial lung disease) (HCC)  J84.9   4. Pulmonary emphysema, unspecified emphysema type ([place])  J43.9      Covid-19 vaccine: tough to say if vaccine will benefit you. Ultimately is a personal choice. On balance I think you will gain benefit if you take it 4 or 6 months after your first covid-19 vaccine  ILD  - do Serum: ESR, ACE, ANA, DS-DNA, RF, anti-CCP,  Total CK,  Aldolase,  SsA, ssb, scl-70, HP panel - if these are abnormal we can consider steroid treatment  - natural course of post covid-19 fibrosis is not known  - if getting worse then we can cosndier non-steroidal anti-fibrotics - refer pulmnary rehab  Emphysema  - check alpha 1 07/03/2019   - continue trelegy scheduled and nebulizer prn  Chronic resp failuyre  - continue o2 with exertiona - do repeat spirometry/dlco in march/april 2021  Followup  ILD clinic or 30 minute slot in march / April 2021 after breathing test

## 2019-07-04 ENCOUNTER — Other Ambulatory Visit: Payer: Self-pay | Admitting: Internal Medicine

## 2019-07-04 DIAGNOSIS — Z8709 Personal history of other diseases of the respiratory system: Secondary | ICD-10-CM

## 2019-07-09 ENCOUNTER — Telehealth (HOSPITAL_COMMUNITY): Payer: Self-pay

## 2019-07-09 DIAGNOSIS — J449 Chronic obstructive pulmonary disease, unspecified: Secondary | ICD-10-CM | POA: Diagnosis not present

## 2019-07-09 NOTE — Telephone Encounter (Signed)
Pt insurance is active and benefits verified through [facility]. Co-pay $0.00, DED $1.500.00/$1,500.00 met, out of pocket $4,000.00/$4,000.00 met, co-insurance 20%. No pre-authorization required. Nash S./[employer], 07/09/2019 @ 419PM, PFR#[id number]  Pass to [place] for scheduling.

## 2019-07-11 LAB — ANGIOTENSIN CONVERTING ENZYME: Angiotensin-Converting Enzyme: 21 U/L (ref 9–67)

## 2019-07-11 LAB — HYPERSENSITIVITY PNUEMONITIS PROFILE
ASPERGILLUS FUMIGATUS: NEGATIVE
Faenia retivirgula: NEGATIVE
Pigeon Serum: NEGATIVE
S. VIRIDIS: NEGATIVE
T. CANDIDUS: NEGATIVE
T. VULGARIS: NEGATIVE

## 2019-07-11 LAB — ANA: Anti Nuclear Antibody (ANA): POSITIVE — AB

## 2019-07-11 LAB — ALDOLASE: Aldolase: 5.3 U/L (ref <=8.1)

## 2019-07-11 LAB — SJOGREN'S SYNDROME ANTIBODS(SSA + SSB)
SSA (Ro) (ENA) Antibody, IgG: <1 AI
SSB (La) (ENA) Antibody, IgG: <1 AI

## 2019-07-11 LAB — ANTI-NUCLEAR AB-TITER (ANA TITER): ANA Titer 1: 1:40 {titer} — ABNORMAL HIGH

## 2019-07-11 LAB — CK TOTAL AND CKMB (NOT AT ARMC)
CK, MB: 2.2 ng/mL (ref 0–5.0)
Relative Index: 5.2 — ABNORMAL HIGH (ref 0–4.0)
Total CK: 42 U/L (ref 29–143)

## 2019-07-11 LAB — ALPHA-1 ANTITRYPSIN PHENOTYPE: A-1 Antitrypsin, Ser: 103 mg/dL (ref 83–199)

## 2019-07-11 LAB — ANTI-SCLERODERMA ANTIBODY: Scleroderma (Scl-70) (ENA) Antibody, IgG: <1 AI

## 2019-07-11 LAB — CYCLIC CITRUL PEPTIDE ANTIBODY, IGG: Cyclic Citrullin Peptide Ab: 17 UNITS

## 2019-07-11 LAB — RHEUMATOID FACTOR: Rheumatoid fact SerPl-aCnc: 23 IU/mL — ABNORMAL HIGH (ref <14)

## 2019-07-11 LAB — ANTI-DNA ANTIBODY, DOUBLE-STRANDED: ds DNA Ab: 1 IU/mL

## 2019-07-23 ENCOUNTER — Encounter (HOSPITAL_COMMUNITY): Payer: Self-pay

## 2019-07-24 DIAGNOSIS — J449 Chronic obstructive pulmonary disease, unspecified: Secondary | ICD-10-CM | POA: Diagnosis not present

## 2019-08-01 ENCOUNTER — Telehealth: Payer: Self-pay | Admitting: Primary Care

## 2019-08-01 DIAGNOSIS — J8410 Pulmonary fibrosis, unspecified: Secondary | ICD-10-CM

## 2019-08-01 NOTE — Telephone Encounter (Signed)
Her ANA, sed rate and RF were elevated being fibrosis could be due to connective tissue disease. I am reaching out to MR to see if he wants [name] to refer her to rheumatology to rule this out as a causes (these can be falsely elevated at times).

## 2019-08-01 NOTE — Telephone Encounter (Signed)
ESR 42 and so cosndier 4-6 week steroid taper. Make sure glucose etc., are being monitoried  ANA, RF elevations are barely elevated. Would consider that as normal for age/gender. No need for rheum referralk  Needs to see me midway through pre taper

## 2019-08-01 NOTE — Telephone Encounter (Signed)
Hx covid pneumonia, fibrosis on HRCT favoring post infectious or inflammatory fibrosis. Labs showed elevated ANA (nuclear, speckled), sed rate and RF  Do you want me to refer her to rheumatology or started steroid therapy per your note?

## 2019-08-02 MED ORDER — PREDNISONE 10 MG PO TABS: ORAL_TABLET | ORAL | 0 refills | Status: DC

## 2019-08-02 NOTE — Telephone Encounter (Signed)
Attempted to call pt but unable to reach. Left message for pt to return call. 

## 2019-08-02 NOTE — Telephone Encounter (Signed)
[phone] pt calling back

## 2019-08-02 NOTE — Telephone Encounter (Signed)
Spoke with the pt  She is aware of response per [facility]  Lab order placed  I have scheduled her with MR for 08/22/19 ok per Raquel Sarna

## 2019-08-02 NOTE — Telephone Encounter (Signed)
Please let patient know I spoke with [name], he does not feel she needs to see rheum. Elevated likely d/t age. He recommend steroid taper. Needs glucose checked in 1 week. Needs FU with MR only in 2-3 weeks.

## 2019-08-05 ENCOUNTER — Other Ambulatory Visit: Payer: Self-pay

## 2019-08-05 ENCOUNTER — Telehealth (HOSPITAL_COMMUNITY): Payer: Self-pay | Admitting: *Deleted

## 2019-08-05 ENCOUNTER — Encounter (HOSPITAL_COMMUNITY)
Admission: RE | Admit: 2019-08-05 | Discharge: 2019-08-05 | Disposition: A | Discharge status: Home / Self Care | Payer: BC Managed Care – PPO | Source: Ambulatory Visit | Attending: Internal Medicine | Admitting: Internal Medicine

## 2019-08-05 DIAGNOSIS — Z8616 Personal history of COVID-19: Secondary | ICD-10-CM | POA: Insufficient documentation

## 2019-08-05 NOTE — Telephone Encounter (Signed)
Called to confirm her appointment for walk test /orientation to virtual pulmonary rehab on Tuesday, August 06, 2019 @ 3 pm.

## 2019-08-05 NOTE — Telephone Encounter (Signed)
Patient confirmed appt in pulmonary rehab for walk test/orientation for tomorrow 08/06/2019 @ 3 pm.           Confirm Consent - In the setting of the current Covid19 crisis, you are scheduled for a phone visit with your Cardiac or Pulmonary team member.  Just as we do with many in-gym visits, in order for you to participate in this visit, we must obtain consent.  If you'd like, I can send this to your mychart (if signed up) or email for you to review.  Otherwise, I can obtain your verbal consent now.  By agreeing to a telephone visit, we'd like you to understand that the technology does not allow for your Cardiac or Pulmonary Rehab team member to perform a physical assessment, and thus may limit their ability to fully assess your ability to perform exercise programs. If your provider identifies any concerns that need to be evaluated in person, we will make arrangements to do so.  Finally, though the technology is pretty good, we cannot assure that it will always work on either your or our end and we cannot ensure that we have a secure connection.  Cardiac and Pulmonary Rehab Telehealth visits and "At Home" cardiac and pulmonary rehab are provided at no cost to you.        Are you willing to proceed?"        STAFF: Did the patient verbally acknowledge consent to telehealth visit? Document YES/NO here: Yes     Rosebud Poles RN  Cardiac and Pulmonary Rehab Staff        Date 08/05/19     @ Time 2:06 PM

## 2019-08-06 ENCOUNTER — Encounter (HOSPITAL_COMMUNITY)
Admission: RE | Admit: 2019-08-06 | Discharge: 2019-08-06 | Disposition: A | Discharge status: Home / Self Care | Payer: BC Managed Care – PPO | Source: Ambulatory Visit | Attending: Internal Medicine | Admitting: Internal Medicine

## 2019-08-06 VITALS — BP 132/70 | HR 92 | Ht 66.0 in | Wt 182.5 lb

## 2019-08-06 DIAGNOSIS — Z8616 Personal history of COVID-19: Secondary | ICD-10-CM

## 2019-08-06 NOTE — Progress Notes (Signed)
Maria [name] 64 y.o. female Pulmonary Rehab Orientation Note Patient arrived today in Cardiac and Pulmonary Rehab for orientation and walk test  to Pulmonary Rehab. She walked from the [facility] parking lot without difficulty. She does carry portable oxygen. Per pt, she uses oxygen intermittently. She sleeps with 3L continuous at night, 3 L pulsed with her POC with activity, and no oxygen when at rest.  Color good, skin warm and dry. Patient is oriented to time and place. Patient's medical history, psychosocial health, and medications reviewed. Psychosocial assessment reveals pt lives with their spouse. 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 Pt hobbies include crocheting and reading. Pt reports her stress level is low. Areas of stress/anxiety include Health.  Pt does not exhibit  signs of depression. She does have difficulty staying asleep, but does not feel it is due to depression. PHQ2/9 score 0/6. Pt shows good  coping skills with positive outlook . Will continue to monitor and evaluate progress toward psychosocial goal(s) of continue mental wellbeing. Physical assessment reveals heart rate is normal, breath sounds clear to auscultation, no wheezes, rales, or rhonchi. Grip strength equal, strong. Distal pulses 3+ bilateral posterior tibial pulses . Patient reports she does take medications as prescribed. Patient states she follows a Regular diet. The patient reports no specific efforts to gain or lose weight.. Patient's weight will be monitored closely. Demonstration and practice of PLB using pulse oximeter. Patient able to return demonstration satisfactorily. Safety and hand hygiene in the exercise area reviewed with patient. Patient voices understanding of the information reviewed. Department expectations discussed with patient and achievable goals were set. The patient shows enthusiasm about attending the program and we look forward to working with  this nice lady. The patient completed a 6 min walk test today, 08/06/19. Patient will be participating in virtual pulmonary rehab. [phone]

## 2019-08-07 NOTE — Progress Notes (Signed)
Reviewed home exercise guidelines with patient including endpoints, temperature precautions, target heart rate and rate of perceived exertion. Pt plans to use NuStep machine as her mode of home exercise. Pt instructed to always wear her oxygen during exercise titrating as necessary to maintain SaO2 above 88%. Pt has a pulse oximeter to measure heart rate and SaO2. Patient will exercise 3 minutes, rest 1 minute for 30 minutes, 2-3 days/week. Pt voices understanding of instructions given.  Sol Passer, MS, ACSM CEP

## 2019-08-07 NOTE — Progress Notes (Signed)
Pulmonary Individual Treatment Plan  Patient Details  Name: Maria [name] MRN: [id number] Date of Birth: 1956/05/29 Referring Provider:     Pulmonary Rehab Walk Test from 08/06/2019 in [place]  Referring Provider  Brand Males, MD      Initial Encounter Date:    Pulmonary Rehab Walk Test from 08/06/2019 in [place]  Date  08/06/19      Visit Diagnosis: History of 2019 novel coronavirus disease (COVID-19)  Patient's Home Medications on Admission:   Current Outpatient Medications:  .  budesonide (PULMICORT) 0.25 MG/2ML nebulizer solution, USE 1 VIAL IN NEBULIZER TWICE A DAY, Disp: 120 mL, Rfl: 1 .  diclofenac sodium (VOLTAREN) 1 % GEL, Apply 2 g topically 4 (four) times daily as needed (pain). , Disp: , Rfl:  .  Fluticasone-Umeclidin-Vilant (TRELEGY ELLIPTA) 100-62.5-25 MCG/INH AEPB, INHALE 1 PUFF ONCE DAILY, Disp: , Rfl:  .  ipratropium-albuterol (DUONEB) 0.5-2.5 (3) MG/3ML SOLN, USE 1 AMPULE IN NEBULIZER 4 TIMES A DAY AS NEEDED, Disp: 360 mL, Rfl: 1 .  predniSONE (DELTASONE) 10 MG tablet, Take 4 tabs qd x 1 week; then 3 tabs qd x 1 week; then 2 tabs qd x 1 week; then 1 tab qd x 1 week; then 1/2 tab qd x 1 week, Disp: 75 tablet, Rfl: 0 .  Respiratory Therapy Supplies (FLUTTER) DEVI, Use as directed, Disp: 1 each, Rfl: 0 .  VENTOLIN HFA 108 (90 Base) MCG/ACT inhaler, Inhale 2 puffs into the lungs every 6 (six) hours as needed for shortness of breath., Disp: , Rfl:  .  amLODipine (NORVASC) 5 MG tablet, Take 10 mg by mouth daily. Per patient on hold until her BP gets back to normal as of 03/19/19 office visit., Disp: , Rfl:  .  benzonatate (TESSALON) 200 MG capsule, Take 1 capsule (200 mg total) by mouth 3 (three) times daily as needed for cough. (Patient not taking: Reported on 08/06/2019), Disp: 30 capsule, Rfl: 1 .  Cholecalciferol (VITAMIN D3) 50 MCG (2000 UT) capsule, TAKE 1 CAPSULE BY MOUTH EVERY DAY (Patient not  taking: Reported on 08/06/2019), Disp: 30 capsule, Rfl: 1 .  nortriptyline (PAMELOR) 25 MG capsule, Take 25 mg by mouth at bedtime., Disp: , Rfl:  .  pregabalin (LYRICA) 50 MG capsule, Take 50 mg by mouth 3 (three) times daily. Per patient on hold until her BP gets back to normal as of 03/19/19 office visit., Disp: , Rfl:  .  promethazine-codeine (PHENERGAN WITH CODEINE) 6.25-10 MG/5ML syrup, Take 5 mLs by mouth every 8 (eight) hours as needed for cough. (Patient not taking: Reported on 08/06/2019), Disp: 200 mL, Rfl: 0 .  venlafaxine XR (EFFEXOR-XR) 75 MG 24 hr capsule, Take 75 mg by mouth daily., Disp: , Rfl:   Past Medical History: Past Medical History:  Diagnosis Date  . Atherosclerosis 1/[id number]   aorta, iliacs and CFA bilaterally no greater than 0-49% - lower arterial doppler.  . Chest pain   . COPD (chronic obstructive pulmonary disease) (HCC)     Tobacco Use: Social History   Tobacco Use  Smoking Status Former Smoker  . Types: Cigarettes  . Quit date: 03/18/2016  . Years since quitting: 3.3  Smokeless Tobacco Never Used    Labs: Recent Review Scientist, physiological    Labs for ITP Cardiac and Pulmonary Rehab Latest Ref Rng & Units 10/04/2008   Cholestrol 0 - 200 mg/dL 136 ATP III CLASSIFICATION: <200     mg/dL  Desirable 200-239  mg/dL   Borderline High >=240    mg/dL   High   LDLCALC 0 - 99 mg/dL 83 Total Cholesterol/HDL:CHD Risk Coronary Heart Disease Risk Table Men   Women 1/2 Average Risk   3.4   3.3 Average Risk       5.0   4.4 2 X Average Risk   9.6   7.1 3 X Average Risk  23.4   11.0 Use the calculated Patient Ratio above and the CHD Risk Table to determine the patient's CHD Risk. ATP III CLASSIFICATION (LDL): <100     mg/dL   Optimal 100-129  mg/dL   Near or Above Optimal 130-159  mg/dL   Borderline 160-189  mg/dL   High >190     mg/dL   Very High   HDL >39 mg/dL 31(L)   Trlycerides <150 mg/dL 111      Capillary Blood Glucose: Lab Results  Component Value  Date   GLUCAP 93 02/26/2019     Pulmonary Assessment Scores: Pulmonary Assessment Scores    Row Name 08/06/19 1545         ADL UCSD   ADL Phase  Entry     SOB Score total  91       CAT Score   CAT Score  26       mMRC Score   mMRC Score  4       UCSD: Self-administered rating of dyspnea associated with activities of daily living (ADLs) 6-point scale (0 = "not at all" to 5 = "maximal or unable to do because of breathlessness")  Scoring Scores range from 0 to 120.  Minimally important difference is 5 units  CAT: CAT can identify the health impairment of COPD patients and is better correlated with disease progression.  CAT has a scoring range of zero to 40. The CAT score is classified into four groups of low (less than 10), medium (10 - 20), high (21-30) and very high (31-40) based on the impact level of disease on health status. A CAT score over 10 suggests significant symptoms.  A worsening CAT score could be explained by an exacerbation, poor medication adherence, poor inhaler technique, or progression of COPD or comorbid conditions.  CAT MCID is 2 points  mMRC: mMRC (Modified Medical Research Council) Dyspnea Scale is used to assess the degree of baseline functional disability in patients of respiratory disease due to dyspnea. No minimal important difference is established. A decrease in score of 1 point or greater is considered a positive change.   Pulmonary Function Assessment: Pulmonary Function Assessment - 08/06/19 1523      Breath   Shortness of Breath  Yes;Limiting activity;Fear of Shortness of Breath       Exercise Target Goals: Exercise Program Goal: Individual exercise prescription set using results from initial 6 min walk test and THRR while considering  patient's activity barriers and safety.   Exercise Prescription Goal: Initial exercise prescription builds to 30-45 minutes a day of aerobic activity, 2-3 days per week.  Home exercise guidelines will be  given to patient during program as part of exercise prescription that the participant will acknowledge.  Activity Barriers & Risk Stratification: Activity Barriers & Cardiac Risk Stratification - 08/06/19 1519      Activity Barriers & Cardiac Risk Stratification   Activity Barriers  Joint Problems;Deconditioning;Muscular Weakness;Shortness of Breath;Other (comment)    Comments  Patient has chronic left hip pain and shoulder pain.       6  Minute Walk: 6 Minute Walk    Row Name 08/06/19 1544         6 Minute Walk   Phase  Initial     Distance  587 feet     Walk Time  6 minutes     # of Rest Breaks  3     MPH  1.11     METS  2.13     RPE  14     Perceived Dyspnea   3     VO2 Peak  7.46     Symptoms  Yes (comment)     Comments  Patient c/o lightheadedness/dizziness. Resolved with rest.     Resting HR  84 bpm     Resting BP  150/70     Resting Oxygen Saturation   97 % 3 L pulsed     Exercise Oxygen Saturation  during 6 min walk  94 % 3LP     Max Ex. HR  97 bpm     Max Ex. BP  158/78     2 Minute Post BP  128/78       Interval HR   1 Minute HR  89     2 Minute HR  95     3 Minute HR  95     4 Minute HR  97     5 Minute HR  91     6 Minute HR  82     2 Minute Post HR  77     Interval Heart Rate?  Yes       Interval Oxygen   Interval Oxygen?  Yes     Baseline Oxygen Saturation %  97 %     1 Minute Oxygen Saturation %  95 %     1 Minute Liters of Oxygen  3 L pulsed     2 Minute Oxygen Saturation %  94 %     2 Minute Liters of Oxygen  3 L pulsed     3 Minute Oxygen Saturation %  95 %     3 Minute Liters of Oxygen  3 L pulsed     4 Minute Oxygen Saturation %  95 %     4 Minute Liters of Oxygen  3 L pulsed     5 Minute Oxygen Saturation %  97 %     5 Minute Liters of Oxygen  3 L pulsed     6 Minute Oxygen Saturation %  97 %     6 Minute Liters of Oxygen  3 L pulsed     2 Minute Post Oxygen Saturation %  97 %     2 Minute Post Liters of Oxygen  3 L pulsed         Oxygen Initial Assessment: Oxygen Initial Assessment - 08/06/19 1522      Home Oxygen   Home Oxygen Device  Portable Concentrator;Home Concentrator;E-Tanks    Sleep Oxygen Prescription  Continuous    Liters per minute  3    Home Exercise Oxygen Prescription  Pulsed    Liters per minute  3    Home at Rest Exercise Oxygen Prescription  None    Compliance with Home Oxygen Use  No      Initial 6 min Walk   Oxygen Used  Pulsed    Liters per minute  3      Program Oxygen Prescription   Program Oxygen Prescription  Pulsed    Liters per  minute  3      Intervention   Short Term Goals  To learn and exhibit compliance with exercise, home and travel O2 prescription;To learn and understand importance of maintaining oxygen saturations>88%;To learn and demonstrate proper use of respiratory medications;To learn and understand importance of monitoring SPO2 with pulse oximeter and demonstrate accurate use of the pulse oximeter.;To learn and demonstrate proper pursed lip breathing techniques or other breathing techniques.    Long  Term Goals  Exhibits compliance with exercise, home and travel O2 prescription;Verbalizes importance of monitoring SPO2 with pulse oximeter and return demonstration;Maintenance of O2 saturations>88%;Exhibits proper breathing techniques, such as pursed lip breathing or other method taught during program session;Compliance with respiratory medication       Oxygen Re-Evaluation:   Oxygen Discharge (Final Oxygen Re-Evaluation):   Initial Exercise Prescription: Initial Exercise Prescription - 08/07/19 1100      Date of Initial Exercise RX and Referring Provider   Date  08/06/19    Referring Provider  Brand Males, MD      Oxygen   Oxygen  Intermittent    Liters  3      NuStep   Level  1    Minutes  30      Prescription Details   Frequency (times per week)  2-3    Duration  Progress to 30 minutes of continuous aerobic without signs/symptoms of physical  distress      Intensity   THRR 40-80% of Max Heartrate  63-126    Ratings of Perceived Exertion  11-13    Perceived Dyspnea  0-4      Progression   Progression  Continue to progress workloads to maintain intensity without signs/symptoms of physical distress.      Resistance Training   Training Prescription  Yes    Weight  --   orange band   Reps  10-15       Perform Capillary Blood Glucose checks as needed.  Exercise Prescription Changes:   Exercise Comments: Exercise Comments    Row Name 08/06/19 1600           Exercise Comments  Reviewed home exercise plan with patient.          Exercise Goals and Review: Exercise Goals    Row Name 08/06/19 1600             Exercise Goals   Increase Physical Activity  Yes       Intervention  Provide advice, education, support and counseling about physical activity/exercise needs.;Develop an individualized exercise prescription for aerobic and resistive training based on initial evaluation findings, risk stratification, comorbidities and participant's personal goals.       Expected Outcomes  Short Term: Attend rehab on a regular basis to increase amount of physical activity.;Long Term: Exercising regularly at least 3-5 days a week.;Long Term: Add in home exercise to make exercise part of routine and to increase amount of physical activity.       Increase Strength and Stamina  Yes       Intervention  Provide advice, education, support and counseling about physical activity/exercise needs.;Develop an individualized exercise prescription for aerobic and resistive training based on initial evaluation findings, risk stratification, comorbidities and participant's personal goals.       Expected Outcomes  Short Term: Increase workloads from initial exercise prescription for resistance, speed, and METs.;Short Term: Perform resistance training exercises routinely during rehab and add in resistance training at home;Long Term: Improve  cardiorespiratory fitness, muscular endurance and strength  as measured by increased METs and functional capacity (6MWT)       Able to understand and use rate of perceived exertion (RPE) scale  Yes       Intervention  Provide education and explanation on how to use RPE scale       Expected Outcomes  Short Term: Able to use RPE daily in rehab to express subjective intensity level;Long Term:  Able to use RPE to guide intensity level when exercising independently       Able to understand and use Dyspnea scale  Yes       Intervention  Provide education and explanation on how to use Dyspnea scale       Expected Outcomes  Short Term: Able to use Dyspnea scale daily in rehab to express subjective sense of shortness of breath during exertion;Long Term: Able to use Dyspnea scale to guide intensity level when exercising independently       Knowledge and understanding of Target Heart Rate Range (THRR)  Yes       Intervention  Provide education and explanation of THRR including how the numbers were predicted and where they are located for reference       Expected Outcomes  Short Term: Able to state/look up THRR;Long Term: Able to use THRR to govern intensity when exercising independently;Short Term: Able to use daily as guideline for intensity in rehab       Understanding of Exercise Prescription  Yes       Intervention  Provide education, explanation, and written materials on patient's individual exercise prescription       Expected Outcomes  Short Term: Able to explain program exercise prescription;Long Term: Able to explain home exercise prescription to exercise independently          Exercise Goals Re-Evaluation :   Discharge Exercise Prescription (Final Exercise Prescription Changes):   Nutrition:  Target Goals: Understanding of nutrition guidelines, daily intake of sodium 1500mg , cholesterol 200mg , calories 30% from fat and 7% or less from saturated fats, daily to have 5 or more servings of fruits  and vegetables.  Biometrics: Pre Biometrics - 08/06/19 1520      Pre Biometrics   Height  5\' 6"  (1.676 m)    Weight  82.8 kg    BMI (Calculated)  29.48    Grip Strength  25.5 kg        Nutrition Therapy Plan and Nutrition Goals:   Nutrition Assessments:   Nutrition Goals Re-Evaluation:   Nutrition Goals Discharge (Final Nutrition Goals Re-Evaluation):   Psychosocial: Target Goals: Acknowledge presence or absence of significant depression and/or stress, maximize coping skills, provide positive support system. Participant is able to verbalize types and ability to use techniques and skills needed for reducing stress and depression.  Initial Review & Psychosocial Screening: Initial Psych Review & Screening - 08/06/19 1524      Initial Review   Current issues with  None Identified      Family Dynamics   Good Support System?  Yes      Barriers   Psychosocial barriers to participate in program  There are no identifiable barriers or psychosocial needs.      Screening Interventions   Interventions  Encouraged to exercise       Quality of Life Scores:  Scores of 19 and below usually indicate a poorer quality of life in these areas.  A difference of  2-3 points is a clinically meaningful difference.  A difference of 2-3 points in the  total score of the Quality of Life Index has been associated with significant improvement in overall quality of life, self-image, physical symptoms, and general health in studies assessing change in quality of life.  PHQ-9: Recent Review Flowsheet Data    Depression screen [facility] 2/9 08/06/2019   Decreased Interest 0   Down, Depressed, Hopeless 0   PHQ - 2 Score 0   Altered sleeping 3    Tired, decreased energy 3    Change in appetite 0   Feeling bad or failure about yourself  0   Trouble concentrating 0   Moving slowly or fidgety/restless 0   Suicidal thoughts 0   PHQ-9 Score 6   Difficult doing work/chores Not difficult at all      Interpretation of Total Score  Total Score Depression Severity:  1-4 = Minimal depression, 5-9 = Mild depression, 10-14 = Moderate depression, 15-19 = Moderately severe depression, 20-27 = Severe depression   Psychosocial Evaluation and Intervention: Psychosocial Evaluation - 08/06/19 1524      Psychosocial Evaluation & Interventions   Interventions  Encouraged to exercise with the program and follow exercise prescription    Continue Psychosocial Services   No Follow up required       Psychosocial Re-Evaluation:   Psychosocial Discharge (Final Psychosocial Re-Evaluation):   Education: Education Goals: Education classes will be provided on a weekly basis, covering required topics. Participant will state understanding/return demonstration of topics presented.  Learning Barriers/Preferences: Learning Barriers/Preferences - 08/06/19 1524      Learning Barriers/Preferences   Learning Barriers  None    Learning Preferences  Audio;Computer/Internet;Group Instruction;Individual Instruction;Pictoral;Skilled Demonstration;Verbal Instruction;Video;Written Material       Education Topics: Risk Factor Reduction:  -Group instruction that is supported by a PowerPoint presentation. Instructor discusses the definition of a risk factor, different risk factors for pulmonary disease, and how the heart and lungs work together.     Nutrition for Pulmonary Patient:  -Group instruction provided by PowerPoint slides, verbal discussion, and written materials to support subject matter. The instructor gives an explanation and review of healthy diet recommendations, which includes a discussion on weight management, recommendations for fruit and vegetable consumption, as well as protein, fluid, caffeine, fiber, sodium, sugar, and alcohol. Tips for eating when patients are short of breath are discussed.   Pursed Lip Breathing:  -Group instruction that is supported by demonstration and informational  handouts. Instructor discusses the benefits of pursed lip and diaphragmatic breathing and detailed demonstration on how to preform both.     Oxygen Safety:  -Group instruction provided by PowerPoint, verbal discussion, and written material to support subject matter. There is an overview of "What is Oxygen" and "Why do we need it".  Instructor also reviews how to create a safe environment for oxygen use, the importance of using oxygen as prescribed, and the risks of noncompliance. There is a brief discussion on traveling with oxygen and resources the patient may utilize.   Oxygen Equipment:  -Group instruction provided by [facility] Staff utilizing handouts, written materials, and equipment demonstrations.   Signs and Symptoms:  -Group instruction provided by written material and verbal discussion to support subject matter. Warning signs and symptoms of infection, stroke, and heart attack are reviewed and when to call the physician/911 reinforced. Tips for preventing the spread of infection discussed.   Advanced Directives:  -Group instruction provided by verbal instruction and written material to support subject matter. Instructor reviews Advanced Directive laws and proper instruction for filling out document.  Pulmonary Video:  -Group video education that reviews the importance of medication and oxygen compliance, exercise, good nutrition, pulmonary hygiene, and pursed lip and diaphragmatic breathing for the pulmonary patient.   Exercise for the Pulmonary Patient:  -Group instruction that is supported by a PowerPoint presentation. Instructor discusses benefits of exercise, core components of exercise, frequency, duration, and intensity of an exercise routine, importance of utilizing pulse oximetry during exercise, safety while exercising, and options of places to exercise outside of rehab.     Pulmonary Medications:  -Verbally interactive group education provided by instructor with  focus on inhaled medications and proper administration.   Anatomy and Physiology of the Respiratory System and Intimacy:  -Group instruction provided by PowerPoint, verbal discussion, and written material to support subject matter. Instructor reviews respiratory cycle and anatomical components of the respiratory system and their functions. Instructor also reviews differences in obstructive and restrictive respiratory diseases with examples of each. Intimacy, Sex, and Sexuality differences are reviewed with a discussion on how relationships can change when diagnosed with pulmonary disease. Common sexual concerns are reviewed.   MD DAY -A group question and answer session with a medical doctor that allows participants to ask questions that relate to their pulmonary disease state.   OTHER EDUCATION -Group or individual verbal, written, or video instructions that support the educational goals of the pulmonary rehab program.   Holiday Eating Survival Tips:  -Group instruction provided by PowerPoint slides, verbal discussion, and written materials to support subject matter. The instructor gives patients tips, tricks, and techniques to help them not only survive but enjoy the holidays despite the onslaught of food that accompanies the holidays.   Knowledge Questionnaire Score: Knowledge Questionnaire Score - 08/06/19 1545      Knowledge Questionnaire Score   Pre Score  17/18       Core Components/Risk Factors/Patient Goals at Admission: Personal Goals and Risk Factors at Admission - 08/06/19 1525      Core Components/Risk Factors/Patient Goals on Admission   Improve shortness of breath with ADL's  Yes    Intervention  Provide education, individualized exercise plan and daily activity instruction to help decrease symptoms of SOB with activities of daily living.    Expected Outcomes  Short Term: Improve cardiorespiratory fitness to achieve a reduction of symptoms when performing ADLs;Long  Term: Be able to perform more ADLs without symptoms or delay the onset of symptoms       Core Components/Risk Factors/Patient Goals Review:  Goals and Risk Factor Review    Row Name 08/06/19 1525             Core Components/Risk Factors/Patient Goals Review   Personal Goals Review  Develop more efficient breathing techniques such as purse lipped breathing and diaphragmatic breathing and practicing self-pacing with activity.;Increase knowledge of respiratory medications and ability to use respiratory devices properly.;Improve shortness of breath with ADL's          Core Components/Risk Factors/Patient Goals at Discharge (Final Review):  Goals and Risk Factor Review - 08/06/19 1525      Core Components/Risk Factors/Patient Goals Review   Personal Goals Review  Develop more efficient breathing techniques such as purse lipped breathing and diaphragmatic breathing and practicing self-pacing with activity.;Increase knowledge of respiratory medications and ability to use respiratory devices properly.;Improve shortness of breath with ADL's       ITP Comments:   Comments: Reviewed home exercise guidelines with patient including endpoints, temperature precautions, target heart rate and rate of perceived exertion. Pt plans  to use NuStep machine as her mode of home exercise. Pt instructed to always wear her oxygen during exercise titrating as necessary to maintain SaO2 above 88%. Pt has a pulse oximeter to measure heart rate and SaO2. Patient will exercise 3 minutes, rest 1 minute for 30 minutes, 2-3 days/week. Pt voices understanding of instructions given. Sol Passer, MS, ACSM CEP

## 2019-08-09 ENCOUNTER — Other Ambulatory Visit (INDEPENDENT_AMBULATORY_CARE_PROVIDER_SITE_OTHER): Payer: BC Managed Care – PPO

## 2019-08-09 DIAGNOSIS — J8410 Pulmonary fibrosis, unspecified: Secondary | ICD-10-CM

## 2019-08-09 DIAGNOSIS — J449 Chronic obstructive pulmonary disease, unspecified: Secondary | ICD-10-CM | POA: Diagnosis not present

## 2019-08-09 LAB — BASIC METABOLIC PANEL
BUN: 18 mg/dL (ref 6–23)
CO2: 26 mEq/L (ref 19–32)
Calcium: 9.9 mg/dL (ref 8.4–10.5)
Chloride: 104 mEq/L (ref 96–112)
Creatinine, Ser: 0.72 mg/dL (ref 0.40–1.20)
GFR: 81.95 mL/min (ref >60.00)
Glucose, Bld: 88 mg/dL (ref 70–99)
Potassium: 3.6 mEq/L (ref 3.5–5.1)
Sodium: 141 mEq/L (ref 135–145)

## 2019-08-14 ENCOUNTER — Telehealth (HOSPITAL_COMMUNITY): Payer: Self-pay

## 2019-08-14 ENCOUNTER — Encounter (HOSPITAL_COMMUNITY)
Admission: RE | Admit: 2019-08-14 | Discharge: 2019-08-14 | Disposition: A | Discharge status: Home / Self Care | Payer: Self-pay | Source: Ambulatory Visit | Attending: Internal Medicine | Admitting: Internal Medicine

## 2019-08-14 ENCOUNTER — Other Ambulatory Visit: Payer: Self-pay

## 2019-08-14 ENCOUNTER — Telehealth: Payer: Self-pay | Admitting: Internal Medicine

## 2019-08-14 DIAGNOSIS — Z8616 Personal history of COVID-19: Secondary | ICD-10-CM | POA: Insufficient documentation

## 2019-08-14 NOTE — Telephone Encounter (Signed)
Yes that is fine. They have a visit with MR on 2/4. If anything changes ED

## 2019-08-14 NOTE — Progress Notes (Signed)
Virtual Cardiac Rehab Note:  Successful telephone encounter with [name] to discuss recent symptoms of dizziness and lightheadedness reported via [employer] app 08/14/19 post exercise. [name] states she is currently using 3 L O2 continuous flow at night and 3 L pulse flow via portable concentrator during day/with exercise. [name] has to walk a significant distance to get to the gym at her employment. States she has to stop 4 times for symptomatic desaturation. Reports lowest sats with exertion 82. States HR "was extremely high" but can't remember the exact number. Sats recover with rest breaks and symptoms of dizziness/lightheadedness/SOB improve. Encouraged patient to utilize pursed lip breathing with exertion and not wait to use for symptomatic shortness of breath.  This RN contacted [facility] to discuss symptoms and to request a possible increase in patients pulse flow during exertion. Magda Paganini states she will send message to [name].   Plan: Will follow up with patient once new orders received  Sanskriti Greenlaw E. Rollene Rotunda RN, BSN [place]. [facility] Cardiac and Pulmonary Rehab [place] Direct: [phone]

## 2019-08-14 NOTE — Telephone Encounter (Signed)
Cardiac Rehab Virtual Note:  Unsuccessful telephone encounter to Maria [name] to follow up on cardiac rehab plan of care and home exercise, and symptoms with exercise logged today on the [employer] Virtual Cardiac Rehab App. Ms. Trammell documents she is having dizziness or lightheadedness post 23min exercise on the [employer].  HIPAA compliant VM message left requesting call back to [phone]. Message also sent through [employer] app.  Plan: Will await call back  Starlina Lapre E. Rollene Rotunda RN, BSN . [facility] [place] Direct: [phone]

## 2019-08-14 NOTE — Telephone Encounter (Signed)
Spoke with pt and notified of recs per [facility] and she verbalized understanding  Nothing further needed

## 2019-08-14 NOTE — Progress Notes (Signed)
Maria [name] 64 y.o. female Nutrition Note - VIRTUAL PULMONARY REHAB  Past Medical History:  Diagnosis Date  . Atherosclerosis [id number]   aorta, iliacs and CFA bilaterally no greater than 0-49% - lower arterial doppler.  . Chest pain   . COPD (chronic obstructive pulmonary disease) (HCC)      Medications reviewed.   Current Outpatient Medications:  .  amLODipine (NORVASC) 5 MG tablet, Take 10 mg by mouth daily. Per patient on hold until her BP gets back to normal as of 03/19/19 office visit., Disp: , Rfl:  .  benzonatate (TESSALON) 200 MG capsule, Take 1 capsule (200 mg total) by mouth 3 (three) times daily as needed for cough. (Patient not taking: Reported on 08/06/2019), Disp: 30 capsule, Rfl: 1 .  budesonide (PULMICORT) 0.25 MG/2ML nebulizer solution, USE 1 VIAL IN NEBULIZER TWICE A DAY, Disp: 120 mL, Rfl: 1 .  Cholecalciferol (VITAMIN D3) 50 MCG (2000 UT) capsule, TAKE 1 CAPSULE BY MOUTH EVERY DAY (Patient not taking: Reported on 08/06/2019), Disp: 30 capsule, Rfl: 1 .  diclofenac sodium (VOLTAREN) 1 % GEL, Apply 2 g topically 4 (four) times daily as needed (pain). , Disp: , Rfl:  .  Fluticasone-Umeclidin-Vilant (TRELEGY ELLIPTA) 100-62.5-25 MCG/INH AEPB, INHALE 1 PUFF ONCE DAILY, Disp: , Rfl:  .  ipratropium-albuterol (DUONEB) 0.5-2.5 (3) MG/3ML SOLN, USE 1 AMPULE IN NEBULIZER 4 TIMES A DAY AS NEEDED, Disp: 360 mL, Rfl: 1 .  nortriptyline (PAMELOR) 25 MG capsule, Take 25 mg by mouth at bedtime., Disp: , Rfl:  .  predniSONE (DELTASONE) 10 MG tablet, Take 4 tabs qd x 1 week; then 3 tabs qd x 1 week; then 2 tabs qd x 1 week; then 1 tab qd x 1 week; then 1/2 tab qd x 1 week, Disp: 75 tablet, Rfl: 0 .  pregabalin (LYRICA) 50 MG capsule, Take 50 mg by mouth 3 (three) times daily. Per patient on hold until her BP gets back to normal as of 03/19/19 office visit., Disp: , Rfl:  .  promethazine-codeine (PHENERGAN WITH CODEINE) 6.25-10 MG/5ML syrup, Take 5 mLs by mouth every 8 (eight) hours as needed  for cough. (Patient not taking: Reported on 08/06/2019), Disp: 200 mL, Rfl: 0 .  Respiratory Therapy Supplies (FLUTTER) DEVI, Use as directed, Disp: 1 each, Rfl: 0 .  venlafaxine XR (EFFEXOR-XR) 75 MG 24 hr capsule, Take 75 mg by mouth daily., Disp: , Rfl:  .  VENTOLIN HFA 108 (90 Base) MCG/ACT inhaler, Inhale 2 puffs into the lungs every 6 (six) hours as needed for shortness of breath., Disp: , Rfl:    Ht Readings from Last 1 Encounters:  08/06/19 5\' 6"  (1.676 m)     Wt Readings from Last 3 Encounters:  08/06/19 182 lb 8.7 oz (82.8 kg)  07/03/19 184 lb 12.8 oz (83.8 kg)  04/22/19 187 lb 6.4 oz (85 kg)     There is no height or weight on file to calculate BMI.   Social History   Tobacco Use  Smoking Status Former Smoker  . Types: Cigarettes  . Quit date: 03/18/2016  . Years since quitting: 3.4  Smokeless Tobacco Never Used     Nutrition Note  Spoke with pt. Nutrition Plan and Nutrition Survey goals reviewed with pt.  Maria [name] reports standing to eat because that helps her breathing. She does not typically skip meals and if she does, she has a protein shake. She has not had any unintended weight loss nor poor appetite. She tries to avoid  large meals for breathing sake. We discussed limiting sodium. Maria [name] reports leg swelling that she has not disclosed to her doctor. We discussed and cardiac rehab RN has provided information to Maria [name]'s  Pulmonologist.  She is wearing compression sock when she can.  She denies reflux with food intake. Recommended adequate protein, fruits, and veggies.   Pt with dx of COPD. Per discussion, pt does not use canned/convenience foods often. Pt does not add salt to food. Pt does not eat out frequently.   Pt expressed understanding of the information reviewed.   Nutrition Diagnosis ? Food-and nutrition-related knowledge deficit related to lack of exposure to information as related to diagnosis of COPD  Nutrition Intervention ? Pt's individual nutrition  plan reviewed with pt.  ? Continue client-centered nutrition education by RD, as part of interdisciplinary care.  Goal(s) ? Pt to build a healthy plate including vegetables, fruits, whole grains, and low-fat dairy products in a heart healthy meal plan. ? Pt to read labels and begin limiting foods with >300 mg sodium ? Pt to have quick and easy meals to have when she feels too tired to cook   Plan:   Will provide client-centered nutrition education as part of interdisciplinary care  Monitor and evaluate progress toward nutrition goal with team.   Michaele Offer, MS, RDN, LDN

## 2019-08-14 NOTE — Telephone Encounter (Signed)
Spoke with Truddie Crumble with Pulmonary Rehab  Pt is attending virtual rehab and logged symptoms of dizziness and lightheadedness with exertion today  She states that she called the pt and pt reported to her that her sats with exertion on 3lpm pulsed are in the low 80's  Portia asking if we can have pt increase her o2 to 4-6 lpm pulsed with exertion  Please advise thanks

## 2019-08-21 ENCOUNTER — Other Ambulatory Visit: Payer: Self-pay | Admitting: Internal Medicine

## 2019-08-22 ENCOUNTER — Other Ambulatory Visit (INDEPENDENT_AMBULATORY_CARE_PROVIDER_SITE_OTHER): Payer: BC Managed Care – PPO

## 2019-08-22 ENCOUNTER — Other Ambulatory Visit: Payer: Self-pay | Admitting: General Surgery

## 2019-08-22 ENCOUNTER — Telehealth (HOSPITAL_COMMUNITY): Payer: Self-pay | Admitting: *Deleted

## 2019-08-22 ENCOUNTER — Other Ambulatory Visit: Payer: Self-pay

## 2019-08-22 ENCOUNTER — Encounter: Payer: Self-pay | Admitting: Internal Medicine

## 2019-08-22 ENCOUNTER — Ambulatory Visit: Payer: BC Managed Care – PPO | Admitting: Internal Medicine

## 2019-08-22 ENCOUNTER — Telehealth: Payer: Self-pay | Admitting: Internal Medicine

## 2019-08-22 VITALS — BP 122/76 | HR 96 | Temp 97.2°F | Ht 66.0 in | Wt 182.4 lb

## 2019-08-22 DIAGNOSIS — J9611 Chronic respiratory failure with hypoxia: Secondary | ICD-10-CM

## 2019-08-22 DIAGNOSIS — R0609 Other forms of dyspnea: Secondary | ICD-10-CM

## 2019-08-22 DIAGNOSIS — I519 Heart disease, unspecified: Secondary | ICD-10-CM | POA: Diagnosis not present

## 2019-08-22 DIAGNOSIS — Z8616 Personal history of COVID-19: Secondary | ICD-10-CM

## 2019-08-22 DIAGNOSIS — R0600 Dyspnea, unspecified: Secondary | ICD-10-CM

## 2019-08-22 DIAGNOSIS — R0602 Shortness of breath: Secondary | ICD-10-CM

## 2019-08-22 DIAGNOSIS — Z148 Genetic carrier of other disease: Secondary | ICD-10-CM

## 2019-08-22 DIAGNOSIS — J849 Interstitial pulmonary disease, unspecified: Secondary | ICD-10-CM | POA: Diagnosis not present

## 2019-08-22 DIAGNOSIS — J439 Emphysema, unspecified: Secondary | ICD-10-CM

## 2019-08-22 LAB — BASIC METABOLIC PANEL
BUN: 13 mg/dL (ref 6–23)
CO2: 28 mEq/L (ref 19–32)
Calcium: 9.7 mg/dL (ref 8.4–10.5)
Chloride: 103 mEq/L (ref 96–112)
Creatinine, Ser: 0.73 mg/dL (ref 0.40–1.20)
GFR: 80.65 mL/min (ref >60.00)
Glucose, Bld: 116 mg/dL — ABNORMAL HIGH (ref 70–99)
Potassium: 4.3 mEq/L (ref 3.5–5.1)
Sodium: 140 mEq/L (ref 135–145)

## 2019-08-22 NOTE — Patient Instructions (Addendum)
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  - wondering if this is worse despite ongoing prednisone burst  Pulmonary emphysema, unspecified emphysema type (HCC) Alpha-1-antitrypsin deficiency carrier  - you have emphysema due to prior smoking but there is also genetic component to this - at this point just do trelegy - we will have to monitor lung function closely over time - because there is an infusion for the alpha 1 but you will only qualify if there is decline in lung function  Diastolic dysfunction, left ventricle  - contributing to shortness of breath  Plan 0 check cbc with diff and bmet - check blood d-dimer 08/22/2019   - if high - will do CTA Chest and HRCT    - If negative- get repeat HRCT supine and prone alone  - get 2D echo next few to several days   - finish prednisone taper as before  - use o2 - 2L at rest but with exertion increase such that pulse ox is > 88%  Followup  - telephone visit with me on APP in next 5-15 days to discuss results  - if tests are non contributory might need more encouragement with rehab

## 2019-08-22 NOTE — Progress Notes (Signed)
2020  Subjective:  Patient ID: Maria [name], female , DOB: 1956-07-16 , age 64 y.o. , MRN: [id number] , ADDRESS: [place] [id number]   03/19/2019 -   Chief Complaint  Patient presents with  . Hospitalization Follow-up    Post covid symptoms   Post COVID  follow-up in the pulmonary office  HPI Maria [name] 64 y.o. 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  And also poor intake.  Was diagnosed to have acute COVID-19.  She remembers being on oxygen.  At the time prior to admission she is had chronic hip pain based on her history and review of the records for which she was using a cane.  She spent a total of 5 days at [facility] the [place] [facility] and was discharged on March 03, 2019.  She was treated with oxygen, Solu-Medrol and REM does Advair.  Last set of laboratories reviewed on March 03, 2019 which showed that she had normal hemoglobin and creatinine and his CRP had improved.  The only chest x-ray that was available is 1 on admission in February 26, 2019 but she has some basal infiltrates.  She now presents to the pulmonary clinic on March 19, 2019.  It is 21 days since admission and she is over 21 days since her first set of symptoms.  She tells me that she is a lot more fatigue.  She is so fatigued that she is not even able to take her inhaler properly which is Trelegy for her COPD.  She feels short of breath all the time.  She is requiring a lot of help taking a shower.  Her hip pain continues.  She is now needing the use of a walker.  Her ECOG is 4.  Her appetite is good.  She is currently not using oxygen either at night or in the daytime.   Results for DOTSIE, GILLETTE" (MRN [id number]) as  of 08/22/2019 12:57  Ref. Range 03/01/2019 03:22 03/02/2019 01:55 03/03/2019 01:54  D-Dimer, America Brown Latest Ref Range: 0.00 - 0.50 ug/mL-FEU 1.34 (H) 1.12 (H) 1.28 (H)       04/03/2019 Presents today for a 2-week follow-up.  Echocardiogram showed normal systolic function with estimated EF 60 to 65%.  Left ventricle diastolic doppler consistent with impaired relaxation-determinant filling pressures.  Right ventricle with normal systolic function, increased wall thickness-systolic pressure could not be assessed.  HRCT is scheduled for 9/29.  Patient called on 9/4 asking about portable oxygen tank order.  Reports that her O2 level has read 84 to 85% after exertion with a heart rate of 110.  She reports that her oxygen level recovers greater than 90% after resting.  Is not currently on oxygen at home will need to be walked in office to qualify for POC. Repeat COVID testing was positive, patient reports that she still has lost her taste and smell with increased shortness of breath.   She is feeling a little better today. Family is checking on her. No other help at home. Reports feeling fine when sitting but her O2 drops as low as 85% on exertion. She is able to recover with rest. Taking prednisone taper as prescribed, feels it has helped her chest tightness. She has dry/hacking  cough with no mucus production. She is eating and drinking ok. Denies fever.   04/09/2019 Patient presents today for 1 week follow-up. Unfortunately we were unable to get her set up with home health d/t her insurance. She is here today for qualifying O2 walk. She continues to have some shortness of breath and dry cough. Low energy. Her taste and smell as returned some. She is drinking plenty of fluids. Reports that her cough is not quite as bad, states the more she talks she has to cough. Has trouble sleeping at night d/t her respiratory symptoms. Feels delsym and tessalon perles have been helpful. ONO on RA showed O2 low 70% with baseline  87%. Ambulatory O2 today showed O2 desaturation 84% on RA, requiring 3L. Using [employer] for nebulizer supplies. Denies fever, weight loss, hemoptysis, purulent mucus.   04/22/2019 Patient presents today for 2 week follow-up. Breathing is the same. Continues to have a dry cough. Completed prednisone 2 days ago. Using incentive spirometry. She has tried to go without her oxygen at home but states that she gets too short winded. 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 Diffuse bronchial wall thickening with moderate centrilobular and paraseptal emphysema; imaging findings suggestive of underlying COPD. She had COPD symptoms before getting COVID. Her breathing was getting a little worse before her diagnosis.  Sleeping better at night with oxygen. Ambulating with rolling walker, still deconditioned/weak. Due for influenza vaccine today.   Oxygen readings:  Resting O2 93% on RA  Ambulatory O2 98-100 on 3L Ambulatory O2 85% on RA  OV 07/03/2019  Subjective:  Patient ID: Maria [name], female , DOB: [birth year] , age 64 y.o. , MRN: [id number] , ADDRESS: [place] [id number]   07/03/2019 -   Chief Complaint  Patient presents with  . Follow-up    PFT performed 12/7 and pt is here following up after that. Pt states she is still becoming SOB with exertion.  Follow-up emphysema Follow-up interstitial lung disease post COVID-19 in August 2020 (had elevatd d-dimer) Follow-up chronic hypoxemic respiratory failure secondary to both   HPI Maria [name] 64 y.o. -presents for the above issues.  She says her condition is returned to normal.  She is back working as a Forensic psychologist.  However she continues to be still significantly symptomatic.  She had a pulmonary function test June 24, 2019.  I visualized the result.  Shows isolated reduction in diffusion  capacity was only moderate at 57%.  FVC and FEV1 are normal.  She continues to be significantly symptomatic as documented below.  She is taking Trelegy.  She says she desaturates to 85% with exertion echocardiogram recently was normal.  She is asking if she should take the Moderna COVID-19 vaccine available in January 2021         IMPRESSION: CT chest 04/16/2019 1. 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 Given the presence of some bronchiectasis, this is technically characterized as probable UIP per current ATS guidelines, however, that is not favored. If there is persistent clinical concern for interstitial lung disease, repeat high-resolution chest CT would be recommended in 12 months to assess for temporal changes in the appearance of the lung parenchyma. 2. Diffuse bronchial wall thickening with moderate centrilobular and paraseptal  emphysema; imaging findings suggestive of underlying COPD. 3. Aortic atherosclerosis, in addition to right coronary artery disease. Please note that although the presence of coronary artery calcium documents the presence of coronary artery disease, the severity of this disease and any potential stenosis cannot be assessed on this non-gated CT examination. Assessment for potential risk factor modification, dietary therapy or pharmacologic therapy may be warranted, if clinically indicated.  Aortic Atherosclerosis (ICD10-I70.0) and Emphysema (ICD10-J43.9).   Electronically Signed   By: Vinnie Langton M.D.   On: 04/16/2019 13:12   OV 08/22/2019  Subjective:  Patient ID: Maria [name], female , DOB: [birth year] , age 20 y.o. , MRN: [id number] , ADDRESS: [place] [id number]   08/22/2019 -   Chief Complaint  Patient presents with  . Follow-up    History of 2019 Novel coronavirus disease  (COVID-19)   Follow-up emphysema - alpha 1 MZ in dec 2020 Follow-up interstitial lung disease post COVID-19 in August 2020 Follow-up chronic hypoxemic respiratory failure secondary to both Ex smoker Echo sept 202 0 - ef 65% and diast dysfn  HPI Maria [name] 64 y.o. -presents for follow-up of the above issues.  In the interim she did have serologic work-up for her ILD that was discovered post Covid setting.  Her ANA and rheumatoid factor were trace positive.  Her ESR was 41.  Therefore we committed her to few to several week prednisone taper.  She says she is on a 5-week taper.  She is got 2 more weeks of this.  She says since her last visit she is feeling worse.  The worsening is in terms of shortness of breath.  Also has dizziness and floaters when she exerts.  In fact on a virtual rehab class she had to get on more oxygen.  Her dyspnea symptom scores are worse than before but cough and fatigue around the same.  She is requiring 3 L now with exertion.  Although in rehab the asked her to increase it to 6 L which helped but this was not documented because of worsening hypoxemia.  Today when we walked her she had to stop several times because of dizziness and fatigue although she did not desaturate.  Her lung function itself in December 2020 shows only isolated reduction in DLCO.   Of note she is alpha-1 MZ from her labs at last visit.  SYMPTOM SCALE - ILD 07/03/2019  08/22/2019   O2 use 2L witg exertion 2L rest, 3L sexertion  Shortness of Breath 0 -> 5 scale with 5 being worst (score 6 If unable to do)   At rest 2 3  Simple tasks - showers, clothes change, eating, shaving 2 4  Household (dishes, doing bed, laundry) 4.54 5  Shopping 5 5  Walking level at own pace 5 4  Walking up Stairs 5 5  Total (40 - 48) Dyspnea Score 23.5 26  How bad is your cough? moderte 3  How bad is your fatigue moderate 3   Simple office walk 185 feet x  3 laps goal with forehead probe 08/22/2019   O2 used 3L    Number laps completed Did only 2 of 3. Stopped 8 times and site down x 2. Got dizzy and floaters in eyes  Comments about pace Very slow pace  Resting Pulse Ox/HR 99% and 91/min  Final Pulse Ox/HR 99% during entire walk  Desaturated </= 88% x  Desaturated <= 3% points x  Got Tachycardic >/= 90/min yes  Symptoms  at end of test As above  Miscellaneous comments Sub maximal exertion     Las\bs - alph 1 MZ in dec 2020 - autoimmune  - ANA 1:40, RF 23, and ESR 40s   PFT dec 2020  - isolated reduction in dlco 57%  ROS - per HPI     has a past medical history of Atherosclerosis ([id number]), Chest pain, and COPD (chronic obstructive pulmonary disease) ([place]).   reports that she quit smoking about 3 years ago. Her smoking use included cigarettes. She has never used smokeless tobacco.  Past Surgical History:  Procedure Laterality Date  . ABDOMINAL HYSTERECTOMY  1978  . NECK SURGERY  1999  . OVARIAN CYST REMOVAL  1988    No Known Allergies  Immunization History  Administered Date(s) Administered  . Influenza, Seasonal, Injecte, Preservative Fre 04/28/2008, 04/09/2010  . Influenza,inj,Quad PF,6+ Mos 04/22/2019  . Influenza-Unspecified 06/01/2008, 05/10/2016, 03/18/2017  . Pneumococcal Polysaccharide-23 04/15/2015  . Tdap 11/17/2009    Family History  Problem Relation Age of Onset  . Heart failure Mother   . Diabetes Mother   . Kidney disease Mother   . Hypertension Mother   . COPD Father   . Cancer Brother        lung  . Diabetes Daughter      Current Outpatient Medications:  .  budesonide (PULMICORT) 0.25 MG/2ML nebulizer solution, USE 1 VIAL IN NEBULIZER TWICE A DAY, Disp: 120 mL, Rfl: 1 .  diclofenac sodium (VOLTAREN) 1 % GEL, Apply 2 g topically 4 (four) times daily as needed (pain). , Disp: , Rfl:  .  Fluticasone-Umeclidin-Vilant (TRELEGY ELLIPTA) 100-62.5-25 MCG/INH AEPB, INHALE 1 PUFF ONCE DAILY, Disp: , Rfl:  .  ipratropium-albuterol (DUONEB) 0.5-2.5 (3)  MG/3ML SOLN, USE 1 AMPULE IN NEBULIZER 4 TIMES A DAY AS NEEDED, Disp: 360 mL, Rfl: 1 .  nortriptyline (PAMELOR) 25 MG capsule, Take 25 mg by mouth at bedtime., Disp: , Rfl:  .  predniSONE (DELTASONE) 10 MG tablet, Take 4 tabs qd x 1 week; then 3 tabs qd x 1 week; then 2 tabs qd x 1 week; then 1 tab qd x 1 week; then 1/2 tab qd x 1 week, Disp: 75 tablet, Rfl: 0 .  Respiratory Therapy Supplies (FLUTTER) DEVI, Use as directed, Disp: 1 each, Rfl: 0 .  VENTOLIN HFA 108 (90 Base) MCG/ACT inhaler, Inhale 2 puffs into the lungs every 6 (six) hours as needed for shortness of breath., Disp: , Rfl:       Objective:   Vitals:   08/22/19 1227  BP: 122/76  Pulse: 96  Temp: (!) 97.2 F (36.2 C)  SpO2: 98%  Weight: 182 lb 6.4 oz (82.7 kg)  Height: '5\' 6"'  (1.676 m)    Estimated body mass index is 29.44 kg/m as calculated from the following:   Height as of this encounter: '5\' 6"'  (1.676 m).   Weight as of this encounter: 182 lb 6.4 oz (82.7 kg).  '@WEIGHTCHANGE' @  [employer]   08/22/19 1227  Weight: 182 lb 6.4 oz (82.7 kg)     Physical Exam  General Appearance:    Alert, cooperative, no distress, appears stated age - yes , Deconditioned looking - no , OBESE  - no, Sitting on Wheelchair -  o  Head:    Normocephalic, without obvious abnormality, atraumatic  Eyes:    PERRL, conjunctiva/corneas clear,  Ears:    Normal TM's and external ear canals, both ears  Nose:   Nares normal, septum midline, mucosa  normal, no drainage    or sinus tenderness. OXYGEN ON  - yes . Patient is @ 2L   Throat:   Lips, mucosa, and tongue normal; teeth and gums normal. Cyanosis on lips - no  Neck:   Supple, symmetrical, trachea midline, no adenopathy;    thyroid:  no enlargement/tenderness/nodules; no carotid   bruit or JVD  Back:     Symmetric, no curvature, ROM normal, no CVA tenderness  Lungs:     Distress - no , Wheeze no, Barrell Chest - no, Purse lip breathing - no, Crackles - no   Chest Wall:    No tenderness  or deformity.    Heart:    Regular rate and rhythm, S1 and S2 normal, no rub   or gallop, Murmur - no  Breast Exam:    NOT DONE  Abdomen:     Soft, non-tender, bowel sounds active all four quadrants,    no masses, no organomegaly. Visceral obesity - yes  Genitalia:   NOT DONE  Rectal:   NOT DONE  Extremities:   Extremities - normal, Has Cane - no, Clubbing - no, Edema - no  Pulses:   2+ and symmetric all extremities  Skin:   Stigmata of Connective Tissue Disease - no  Lymph nodes:   Cervical, supraclavicular, and axillary nodes normal  Psychiatric:  Neurologic:   Pleasant - yes, Anxious - no, Flat affect - no  CAm-ICU - neg, Alert and Oriented x 3 - yes, Moves all 4s - yes, Speech - normal, Cognition - intact           Assessment:       ICD-10-CM   1. Chronic respiratory failure with hypoxia (HCC)  J96.11   2. Dyspnea on exertion  R06.00   3. History of 2019 novel coronavirus disease (COVID-19)  Z86.16   4. ILD (interstitial lung disease) ([place])  J84.9   5. Pulmonary emphysema, unspecified emphysema type ([place])  J43.9   6. Alpha-1-antitrypsin deficiency carrier  Z14.8   7. Diastolic dysfunction, left ventricle  I51.9    She clearly has symptoms out of proportion to the level of her ILD.  She has dizziness and fatigue as well.  This is relatively new and worse since her last visit.      Plan:     Patient Instructions  Chronic respiratory failure with hypoxia (HCC) Dyspnea on exertion  - due to all of above but need to see if any new issue or any of below is worse  - there might be deconditioning too   History of 2019 novel coronavirus disease (COVID-19) ILD (interstitial lung disease) ([place])  - most likely post inflammatory fibrosis  - wondering if this is worse despite ongoing prednisone burst  Pulmonary emphysema, unspecified emphysema type (HCC) Alpha-1-antitrypsin deficiency carrier  - you have emphysema due to prior smoking but there is also genetic component  to this - at this point just do trelegy - we will have to monitor lung function closely over time - because there is an infusion for the alpha 1 but you will only qualify if there is decline in lung function  Diastolic dysfunction, left ventricle  - contributing to shortness of breath  Plan 0 check cbc with diff and bmet - check blood d-dimer 08/22/2019   - if high - will do CTA Chest and HRCT    - If negative- get repeat HRCT supine and prone alone  - get 2D echo next few to several days   -  finish prednisone taper as before  - use o2 - 2L at rest but with exertion increase such that pulse ox is > 88%  Followup  - telephone visit with me on APP in next 5-15 days to discuss results  - if tests are non contributory might need more encouragement with rehab       After the visit some of the lab return have been seen.  Her blood sugar slightly higher than usual.  Will check hemoglobin A1c in case her symptoms are from diabetes related to steroids.   ( Level 05 visit: Estb 40-54 min   in  visit type: on-site physical face to visit  in total care time and counseling or/and coordination of care by this undersigned MD - Dr Brand Males. This includes one or more of the following on this same day 08/22/2019: pre-charting, chart review, note writing, documentation discussion of test results, diagnostic or treatment recommendations, prognosis, risks and benefits of management options, instructions, education, compliance or risk-factor reduction. It excludes time spent by the [place] or office staff in the care of the patient. Actual time 63 min)   SIGNATURE    Dr. Brand Males, M.D., F.C.C.P,  Pulmonary and Critical Care Medicine Staff Physician, [place] Director - Interstitial Lung Disease  Program  Pulmonary [place] at [place], [place], [address]  Pager: [phone], If no answer or between  15:00h - 7:00h: call [phone] Telephone: [phone]  1:15 PM 08/22/2019

## 2019-08-22 NOTE — Telephone Encounter (Signed)
Thanks

## 2019-08-22 NOTE — Telephone Encounter (Signed)
Follow up call to patient re: virtual pulmonary rehab.  LMTCB for questions or concerns re: her home exercise.

## 2019-08-22 NOTE — Telephone Encounter (Signed)
Sending to triage - this lady complaints of exertional dizziness and floaters in her eyes. She is on prednisone . On bmet today sugars slightly high - can you please call lab and see if they can run HgbA1C?  Thanks    SIGNATURE    Dr. Brand Males, M.D., F.C.C.P,  Pulmonary and Critical Care Medicine Staff Physician, [place] Director - Interstitial Lung Disease  Program  Pulmonary [place] at [place], [place], [address]  Pager: [phone], If no answer or between  15:00h - 7:00h: call [phone] Telephone: [phone]  4:10 PM 08/22/2019

## 2019-08-22 NOTE — Telephone Encounter (Signed)
Called the [facility] lab and was advised to send an 'add on' form and fax to [phone]. This has been completed and faxed, confirmation received. Nothing further needed at this time.    Will send to MR as FYI.

## 2019-08-23 ENCOUNTER — Telehealth: Payer: Self-pay | Admitting: Internal Medicine

## 2019-08-23 LAB — CBC WITH DIFFERENTIAL/PLATELET
Basophils Absolute: 0 10*3/uL (ref 0.0–0.2)
Basos: 0 %
EOS (ABSOLUTE): 0 10*3/uL (ref 0.0–0.4)
Eos: 0 %
Hematocrit: 39.3 % (ref 34.0–46.6)
Hemoglobin: 13.6 g/dL (ref 11.1–15.9)
Immature Grans (Abs): 0 10*3/uL (ref 0.0–0.1)
Immature Granulocytes: 0 %
Lymphocytes Absolute: 2.5 10*3/uL (ref 0.7–3.1)
Lymphs: 20 %
MCH: 31.6 pg (ref 26.6–33.0)
MCHC: 34.6 g/dL (ref 31.5–35.7)
MCV: 91 fL (ref 79–97)
Monocytes Absolute: 0.4 10*3/uL (ref 0.1–0.9)
Monocytes: 3 %
Neutrophils Absolute: 9.3 10*3/uL — ABNORMAL HIGH (ref 1.4–7.0)
Neutrophils: 77 %
Platelets: 381 10*3/uL (ref 150–450)
RBC: 4.31 x10E6/uL (ref 3.77–5.28)
RDW: 12.5 % (ref 11.7–15.4)
WBC: 12.3 10*3/uL — ABNORMAL HIGH (ref 3.4–10.8)

## 2019-08-23 LAB — D-DIMER, QUANTITATIVE: D-DIMER: 0.56 mg/L FEU — ABNORMAL HIGH (ref 0.00–0.49)

## 2019-08-23 NOTE — Telephone Encounter (Signed)
ATC the lab - line rang numerous times with no answer and no option to LM.

## 2019-08-23 NOTE — Telephone Encounter (Signed)
Results - given to patient [name]    -> Blood labs shows sligh bump in d-dimer - > get duplex LE venous rule out DVT . In interim if worse go to ER    -> high sugar relatively -> Check HgbA1c - alsi informed to patient  - keep CT for 08/29/2019    SIGNATURE    Dr. Brand Males, M.D., F.C.C.P,  Pulmonary and Critical Care Medicine Staff Physician, [place] Director - Interstitial Lung Disease  Program  Pulmonary [place] at [place], [place], [address]  Pager: [phone], If no answer or between  15:00h - 7:00h: call [phone] Telephone: [phone]  5:39 PM 08/23/2019      LABS  Results for Maria [name], Maria [name]" (MRN [id number]) as of 08/23/2019 17:32  Ref. Range 08/22/2019 13:59  D-dimer Latest Ref Range: 0.00 - 0.49 mg/L FEU 0.56 (H)    PULMONARY No results for input(s): PHART, PCO2ART, PO2ART, HCO3, TCO2, O2SAT in the last 168 hours.  Invalid input(s): PCO2, PO2  CBC Recent Labs  Lab 08/22/19 1359  HGB 13.6  HCT 39.3  WBC 12.3*  PLT 381    COAGULATION No results for input(s): INR in the last 168 hours.  CARDIAC  No results for input(s): TROPONINI in the last 168 hours. No results for input(s): PROBNP in the last 168 hours.   CHEMISTRY Recent Labs  Lab 08/22/19 1359  NA 140  K 4.3  CL 103  CO2 28  GLUCOSE 116*  BUN 13  CREATININE 0.73  CALCIUM 9.7   Estimated Creatinine Clearance: 79.1 mL/min (by C-G formula based on SCr of 0.73 mg/dL).   LIVER No results for input(s): AST, ALT, ALKPHOS, BILITOT, PROT, ALBUMIN, INR in the last 168 hours.   INFECTIOUS No results for input(s): LATICACIDVEN, PROCALCITON in the last 168 hours.   ENDOCRINE CBG (last 3)  No results for input(s): GLUCAP in the last 72 hours.       IMAGING x48h  - image(s) personally visualized  -   highlighted in bold No results found.

## 2019-08-24 DIAGNOSIS — J449 Chronic obstructive pulmonary disease, unspecified: Secondary | ICD-10-CM | POA: Diagnosis not present

## 2019-08-26 ENCOUNTER — Other Ambulatory Visit: Payer: Self-pay

## 2019-08-26 ENCOUNTER — Encounter (HOSPITAL_COMMUNITY)
Admission: RE | Admit: 2019-08-26 | Discharge: 2019-08-26 | Disposition: A | Discharge status: Home / Self Care | Payer: BC Managed Care – PPO | Source: Ambulatory Visit | Attending: Internal Medicine | Admitting: Internal Medicine

## 2019-08-26 DIAGNOSIS — Z8616 Personal history of COVID-19: Secondary | ICD-10-CM | POA: Insufficient documentation

## 2019-08-26 NOTE — Progress Notes (Signed)
Called patient re: logged symptom of shortness of breath in the [facility] virtual app.  She had an in person appointment with [name] 08/22/19 and lab work showed glucose 116, fasting.  MD stopped her prednisone which she has been on for several months.  He is also wondering her c/o dizziness and seeing "spots" is related to elevated glucose. She also had a mildly elevated d-dimer and is scheduled for a duplex scan to r/o DVT, also scheduled for CT scan 08/29/19. He instructed her to take it easy and not to do strenuous activity. She is experiencing more shortness of breath than normal with exercise today and required 6 L of oxygen to keep her saturation 90-92%. After looking at the whole senario, I instructed patient to not exercise until all her test results are back.  I do not think she realized she should not be exercising.  She agreed and will not exercise until after she receives those results.

## 2019-08-26 NOTE — Telephone Encounter (Signed)
noted 

## 2019-08-27 ENCOUNTER — Ambulatory Visit: Payer: Self-pay | Admitting: Internal Medicine

## 2019-08-27 ENCOUNTER — Telehealth: Payer: Self-pay | Admitting: Internal Medicine

## 2019-08-27 ENCOUNTER — Encounter: Payer: Self-pay | Admitting: Family Medicine

## 2019-08-27 ENCOUNTER — Ambulatory Visit: Payer: BC Managed Care – PPO | Admitting: Family Medicine

## 2019-08-27 ENCOUNTER — Other Ambulatory Visit: Payer: Self-pay

## 2019-08-27 VITALS — BP 112/84 | HR 84 | Temp 97.4°F | Ht 65.25 in | Wt 178.2 lb

## 2019-08-27 DIAGNOSIS — K130 Diseases of lips: Secondary | ICD-10-CM | POA: Insufficient documentation

## 2019-08-27 DIAGNOSIS — R350 Frequency of micturition: Secondary | ICD-10-CM | POA: Diagnosis not present

## 2019-08-27 DIAGNOSIS — R739 Hyperglycemia, unspecified: Secondary | ICD-10-CM

## 2019-08-27 DIAGNOSIS — J9611 Chronic respiratory failure with hypoxia: Secondary | ICD-10-CM

## 2019-08-27 DIAGNOSIS — U071 COVID-19: Secondary | ICD-10-CM

## 2019-08-27 DIAGNOSIS — R0600 Dyspnea, unspecified: Secondary | ICD-10-CM

## 2019-08-27 DIAGNOSIS — R3129 Other microscopic hematuria: Secondary | ICD-10-CM | POA: Diagnosis not present

## 2019-08-27 LAB — POCT URINALYSIS DIPSTICK
Bilirubin, UA: NEGATIVE
Blood, UA: POSITIVE
Glucose, UA: NEGATIVE
Ketones, UA: NEGATIVE
Leukocytes, UA: NEGATIVE
Nitrite, UA: NEGATIVE
Protein, UA: NEGATIVE
Spec Grav, UA: 1.015 (ref 1.010–1.025)
Urobilinogen, UA: 0.2 E.U./dL
pH, UA: 6.5 (ref 5.0–8.0)

## 2019-08-27 NOTE — Telephone Encounter (Signed)
lmtcb for pt.  Orders placed for doppler, A1c, and HRCT.   PCC's the current HRCT the pt is scheduled for is under the incorrect 'ordering provider', can you link the new HRCT order to the date of the scan?

## 2019-08-27 NOTE — Assessment & Plan Note (Signed)
Worsening SOB. Reviewed Pulm note. In rehab and on inhalers. Planning repeat ECHO and [name] for DVT rule out. Cont pulm care.

## 2019-08-27 NOTE — Telephone Encounter (Signed)
Have linked the correct order dated 2/9 by MR.

## 2019-08-27 NOTE — Telephone Encounter (Signed)
Maria [name]  Same as note from 08/23/2019   -> Blood labs shows sligh bump in d-dimer - > get duplex LE venous rule out DVT . In interim if worse go to ER    -> high sugar relatively -> Check HgbA1c - alsi informed to patient  - keep CT for 08/29/2019 but ensure is HRCT   - make results followup with me via telephone  - also please get me start date/dose of prednisone and when it will end? - was not clear from BE[facility] notes

## 2019-08-27 NOTE — Progress Notes (Signed)
Subjective:     Maria [name] is a 64 y.o. female presenting for Establish Care (previous PCP was Dr Tempie Donning with [facility]) and Shortness of Breath (follows Dr Chase Caller and has a test ordered for this week to follow up and ? blood clot)     HPI   #SOB - s/p Covid - seeing pulmonology - oxygen dependent - has been back to work since October - some limitations but able to do her job  Will get a film in her mouth - off and on during the day - feels like her mouth is stuck together - has dentures - and removes these at night - stays very dry with the oxygen - drinking water - white film  #urine concern - drinks a lot of water - feeling pressure over the last few days - no burning - no frequency - worsening urgency from baseline     Review of Systems   Social History   Tobacco Use  Smoking Status Former Smoker  . Packs/day: 1.00  . Years: 45.00  . Pack years: 45.00  . Types: Cigarettes  . Quit date: 03/18/2016  . Years since quitting: 3.4  Smokeless Tobacco Never Used        Objective:    BP Readings from Last 3 Encounters:  08/27/19 112/84  08/22/19 122/76  08/06/19 132/70   Wt Readings from Last 3 Encounters:  08/27/19 178 lb 4 oz (80.9 kg)  08/22/19 182 lb 6.4 oz (82.7 kg)  08/06/19 182 lb 8.7 oz (82.8 kg)    BP 112/84   Pulse 84   Temp (!) 97.4 F (36.3 C)   Ht 5' 5.25" (1.657 m)   Wt 178 lb 4 oz (80.9 kg)   SpO2 99% Comment: on 2 liters of oxygen  BMI 29.44 kg/m    Physical Exam Constitutional:      General: She is not in acute distress.    Appearance: She is well-developed. She is not diaphoretic.  HENT:     Head: Normocephalic and atraumatic.     Right Ear: External ear normal.     Left Ear: External ear normal.     Nose: Nose normal.  Eyes:     Conjunctiva/sclera: Conjunctivae normal.  Cardiovascular:     Rate and Rhythm: Normal rate and regular rhythm.     Heart sounds: No murmur.  Pulmonary:     Effort: Pulmonary  effort is normal.     Breath sounds: Decreased breath sounds present. No wheezing or rhonchi.     Comments: On oxygen Musculoskeletal:     Cervical back: Neck supple.  Skin:    General: Skin is warm and dry.     Capillary Refill: Capillary refill takes less than 2 seconds.  Neurological:     Mental Status: She is alert. Mental status is at baseline.  Psychiatric:        Mood and Affect: Mood normal.        Behavior: Behavior normal.           Assessment & Plan:   Problem List Items Addressed This Visit      Respiratory   Chronic respiratory failure with hypoxia (HCC)    Worsening SOB. Reviewed Pulm note. In rehab and on inhalers. Planning repeat ECHO and [name] for DVT rule out. Cont pulm care.         Digestive   Cheilitis    Lip symptom possible cheilitis. As no findings on exam today -  advised getting dentures re-fitted and using vaseline. Return if worsening.         Genitourinary   Other microscopic hematuria - Primary    Pt with some pressure. Advised monitoring symptoms. Repeat urine in 1 month. Urine micro to further evaluate blood on UA. Urology if persisting.       Relevant Orders   Urinalysis, microscopic only    Other Visit Diagnoses    Urinary frequency       Relevant Orders   POCT urinalysis dipstick (Completed)       Return in about 4 weeks (around 09/24/2019).  [name], MD

## 2019-08-27 NOTE — Telephone Encounter (Signed)
Patient is returning phone call.  Patient phone number is [phone].

## 2019-08-27 NOTE — Patient Instructions (Addendum)
#   Film on mouth - consider getting dentures adjusted - use Vaseline - continue good oral care  #Breathing - continue with pulmonology and I will continue to follow along  #Urine  - shows some blood   - continue to watch your symptoms - if worsening - belly pain, difficulty urinating then let me know - return in 1 month for urine recheck

## 2019-08-27 NOTE — Assessment & Plan Note (Signed)
Pt with some pressure. Advised monitoring symptoms. Repeat urine in 1 month. Urine micro to further evaluate blood on UA. Urology if persisting.

## 2019-08-27 NOTE — Assessment & Plan Note (Signed)
Lip symptom possible cheilitis. As no findings on exam today - advised getting dentures re-fitted and using vaseline. Return if worsening.

## 2019-08-27 NOTE — Telephone Encounter (Signed)
I called and spoke with the pt and notified of all results and recs per Dr Chase Caller  She verbalized understanding  Pred started on 08/02/2019 and will end on 09/08/2019  MR- your first opening is not until 09/23/2019 and she did not really want to wait this long for telephone results   Forwarding to MR and also to [facility] to see about getting new HRCT order linked to date of scan scheduled for 08/29/19, thanks

## 2019-08-28 ENCOUNTER — Encounter (HOSPITAL_COMMUNITY): Payer: BC Managed Care – PPO

## 2019-08-28 ENCOUNTER — Encounter: Payer: Self-pay | Admitting: Family Medicine

## 2019-08-28 LAB — URINALYSIS, MICROSCOPIC ONLY

## 2019-08-29 ENCOUNTER — Other Ambulatory Visit (INDEPENDENT_AMBULATORY_CARE_PROVIDER_SITE_OTHER): Payer: BC Managed Care – PPO

## 2019-08-29 ENCOUNTER — Encounter (HOSPITAL_COMMUNITY): Payer: Self-pay

## 2019-08-29 ENCOUNTER — Ambulatory Visit (HOSPITAL_COMMUNITY)
Admission: RE | Admit: 2019-08-29 | Discharge: 2019-08-29 | Disposition: A | Discharge status: Home / Self Care | Payer: BC Managed Care – PPO | Source: Ambulatory Visit | Attending: Internal Medicine | Admitting: Internal Medicine

## 2019-08-29 ENCOUNTER — Other Ambulatory Visit: Payer: Self-pay

## 2019-08-29 DIAGNOSIS — J432 Centrilobular emphysema: Secondary | ICD-10-CM | POA: Diagnosis not present

## 2019-08-29 DIAGNOSIS — R739 Hyperglycemia, unspecified: Secondary | ICD-10-CM

## 2019-08-29 DIAGNOSIS — U071 COVID-19: Secondary | ICD-10-CM | POA: Diagnosis not present

## 2019-08-29 DIAGNOSIS — J1282 Pneumonia due to coronavirus disease 2019: Secondary | ICD-10-CM | POA: Insufficient documentation

## 2019-08-29 LAB — HEMOGLOBIN A1C: Hgb A1c MFr Bld: 6.2 % (ref 4.6–6.5)

## 2019-08-30 ENCOUNTER — Telehealth: Payer: Self-pay | Admitting: Internal Medicine

## 2019-08-30 NOTE — Telephone Encounter (Signed)
Spoke with pt, advised her of test results from CT and MR recommendations. She understood  Her start date, she couldn't remember and reported in Jan (5 weeks) but her finish date is 08/31/2019  Her echo is scheduled to be done 2/18

## 2019-08-30 NOTE — Telephone Encounter (Signed)
   CT does not show any evidence of post Covid fibrosis.  She only has emphysema.  She is already on Trelegy.  I do not understand the worsening shortness of breath particularly with prednisone on board.  It could be a prednisone side effect.  Or it could be cardiac issues.  Plan  -Can you find out from her please to start date of the prednisone and the final stop date of the prednisone and the current dosing  -Also get echocardiogram to make sure the heart is okay.  Last echocardiogram was in September 2020 and she only had diastolic dysfunction.  CT CHEST HIGH RESOLUTION  Result Date: 08/30/2019 CLINICAL DATA:  Dyspnea on exertion for several weeks with chest pressure and cough. EXAM: CT CHEST WITHOUT CONTRAST TECHNIQUE: Multidetector CT imaging of the chest was performed following the standard protocol without intravenous contrast. High resolution imaging of the lungs, as well as inspiratory and expiratory imaging, was performed. COMPARISON:  04/16/2019 high-resolution chest CT. FINDINGS: Cardiovascular: Normal heart size. No significant pericardial effusion/thickening. Atherosclerotic nonaneurysmal thoracic aorta. Top-normal caliber main pulmonary artery (3.0 cm diameter), stable. Mediastinum/Nodes: No discrete thyroid nodules. Unremarkable esophagus. No pathologically enlarged axillary, mediastinal or hilar lymph nodes, noting limited sensitivity for the detection of hilar adenopathy on this noncontrast study. Lungs/Pleura: No pneumothorax. No pleural effusion. Moderate to severe centrilobular and paraseptal emphysema with diffuse bronchial wall thickening. No central airway stenoses. No acute consolidative airspace disease, lung masses or significant pulmonary nodules. No significant lobular air trapping or significant evidence of tracheobronchomalacia on the expiration sequence. Similar to decreased parenchymal band at the dependent right lung base. No significant regions of subpleural reticulation,  ground-glass attenuation, traction bronchiectasis, architectural distortion or frank honeycombing. Upper abdomen: Cholecystectomy. Musculoskeletal: No aggressive appearing focal osseous lesions. Surgical hardware from ACDF in the lower cervical spine. Mild thoracic spondylosis. IMPRESSION: 1. No compelling evidence of interstitial lung disease. Chronic parenchymal band at the dependent right lung base compatible with nonspecific postinfectious/postinflammatory scarring. 2. Moderate to severe centrilobular and paraseptal emphysema with diffuse bronchial wall thickening, suggesting COPD. 3. Aortic Atherosclerosis (ICD10-I70.0) and Emphysema (ICD10-J43.9). Electronically Signed   By: Ilona Sorrel M.D.   On: 08/30/2019 08:52

## 2019-08-30 NOTE — Progress Notes (Signed)
Hgba1c is adequate < 6.5 - suggesting no diabetes though PCP [name], MD could want it below 6. But there does not seem to be frank diabetes though is possioble that prednisone is making her have blurred vision

## 2019-09-02 NOTE — Telephone Encounter (Signed)
mychart message sent by pt requesting to know the results of the CT. [name], please advise on this for pt.

## 2019-09-03 ENCOUNTER — Telehealth (HOSPITAL_COMMUNITY): Payer: Self-pay | Admitting: *Deleted

## 2019-09-03 NOTE — Telephone Encounter (Signed)
Called to check on patient, she has experienced more shortness of breath than normal the last week, has had a CT scan done and will have an echo and lower extremity doppler studies to rule out DVT.  LMTCB for an update.

## 2019-09-04 NOTE — Telephone Encounter (Signed)
Will close this message. See another older thread that I sent back to triage 09/04/2019
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
## 2019-09-04 NOTE — Telephone Encounter (Signed)
I am closing this thread because there is another thread going on from today

## 2019-09-04 NOTE — Telephone Encounter (Signed)
She sent another phone message a MyChart message asking for clear explanation of the CT chest results  . -I think it will be difficult to have further clarity on explaining the CT chest results through the triage desk   Plan  -Let her finish her echocardiogram and lower extremity duplex on September 09, 2019  -She needs a face-to-face or video visit or a telephone visit with myself or nurse practitioner probably next week to go over the results and current work-up todate -that will include the duplex, echo and a CT chest

## 2019-09-05 ENCOUNTER — Ambulatory Visit (HOSPITAL_COMMUNITY): Payer: BC Managed Care – PPO

## 2019-09-09 ENCOUNTER — Ambulatory Visit (HOSPITAL_COMMUNITY)
Admission: RE | Admit: 2019-09-09 | Discharge: 2019-09-09 | Disposition: A | Discharge status: Home / Self Care | Payer: BC Managed Care – PPO | Source: Ambulatory Visit | Attending: Internal Medicine | Admitting: Internal Medicine

## 2019-09-09 ENCOUNTER — Other Ambulatory Visit: Payer: Self-pay

## 2019-09-09 ENCOUNTER — Ambulatory Visit (HOSPITAL_BASED_OUTPATIENT_CLINIC_OR_DEPARTMENT_OTHER)
Admission: RE | Admit: 2019-09-09 | Discharge: 2019-09-09 | Disposition: A | Discharge status: Home / Self Care | Payer: BC Managed Care – PPO | Source: Ambulatory Visit | Attending: Internal Medicine | Admitting: Internal Medicine

## 2019-09-09 DIAGNOSIS — I519 Heart disease, unspecified: Secondary | ICD-10-CM | POA: Diagnosis not present

## 2019-09-09 DIAGNOSIS — R0600 Dyspnea, unspecified: Secondary | ICD-10-CM

## 2019-09-09 DIAGNOSIS — J449 Chronic obstructive pulmonary disease, unspecified: Secondary | ICD-10-CM | POA: Diagnosis not present

## 2019-09-09 DIAGNOSIS — J9611 Chronic respiratory failure with hypoxia: Secondary | ICD-10-CM | POA: Diagnosis not present

## 2019-09-09 DIAGNOSIS — R0609 Other forms of dyspnea: Secondary | ICD-10-CM

## 2019-09-09 NOTE — Progress Notes (Signed)
  Echocardiogram 2D Echocardiogram has been performed.  Darlina Sicilian M 09/09/2019, 3:28 PM

## 2019-09-09 NOTE — Progress Notes (Signed)
VASCULAR LAB PRELIMINARY  PRELIMINARY  PRELIMINARY  PRELIMINARY  Bilateral lower extremity venous duplex completed.    Preliminary report:  Se CV proc for preliminary results.   Heyli Min, RVT 09/09/2019, 2:27 PM

## 2019-09-10 NOTE — Progress Notes (Signed)
Interstitial Lung Disease Multidisciplinary Conference   Maria [name]    MRN [id number]    DOB [birth year]  Primary Care Physician:Cody, Jobe Marker, MD  Referring Physician: Ann Lions  Time of Conference: 7.30am- 8.30am Date of conference: 08/27/2019 Location of Conference: -  Virtual  Participating Pulmonary: Dr. Brand Males, MD - yes,  Dr Marshell Garfinkel, MD - yes Pathology: Dr Jaquita Folds, MD - yes , Dr Enid Cutter - n Radiology: Dr Salvatore Marvel MD - yes, Dr Vinnie Langton MD - no,  Dr Lorin Picket, MD - no , Dr Eddie Candle - no Others: Alethia Berthold and Dr Elsworth Soho  Brief History:Maria [name] is prior smoker, alpha 1 MZ with emphysema, and diast dysfn. Maria [name] had covid in augu 2020 and then hypoxemic at discharged. Maria [name] went from ECOG 4 on wheelchair to walking with 2-3L [place]. Seen in late 2020 -> ANA and RF trace positive, and ESR 41. On pred taper x 4 week and mid way through right now. But feeling worse - symptom score went from 20 0> 23 for dyspnea, Walked in office today and got dizzy with floaters - did not desaturate. Repeat workup in progress. Repeat CT and ECHO being done. What is case conference thinks of her HRCT In sept 2020  Serology: Done on July 03, 2019 shows normal alpha-1 antitrypsin:.  Rheumatoid factor XX 3 and ANA 1: 40 nuclear speckled pattern.  Not significant.  Rest of serology negative.  MDD discussion of CT scan    - Date or time period of scan: -April 16, 2019 and high-resolution CT chest again on 10/27/2019.  The February 2021 CT chest shows emphysema and right lower lobe scarring band but otherwise no ILD.  Conference radiologist agreed with the official reading.  There is no interstitial lung disease.  There is just right lower lobe parenchymal band.  No honeycombing.  No progression.  - What is the final conclusion per 2018 ATS/Fleischner Criteria -  -no interstitial lung disease.  Only emphysema present.  Pathology discussion of biopsy  -no  biopsy  PFTs: Pulmonary function test on June 24, 2019 shows isolated reduction in diffusion capacity to 57%.  Labs: x*  MDD Impression/Recs:  -Patient does not have interstitial lung disease   Time Spent in preparation and discussion:  > 71 min    SIGNATURE   Dr. Brand Males, M.D., F.C.C.P,  Pulmonary and Critical Care Medicine Staff Physician, [place] Director - Interstitial Lung Disease  Program  Pulmonary [place] [facility] [place], [place], [address]  Pager: [phone], If no answer or between  15:00h - 7:00h: call [phone] Telephone: [phone]  10:38 AM 09/10/2019 ...................................................................................................................Marland Kitchen References: Diagnosis of Hypersensitivity Pneumonitis in Adults. An Official ATS/JRS/ALAT Clinical Practice Guideline. Ragu G et al, [place] Aug 1;202(3):e36-e69.       Diagnosis of Idiopathic Pulmonary Fibrosis. An Official ATS/ERS/JRS/ALAT Clinical Practice Guideline. Raghu G et al, [place]. 2018 Sep 1;198(5):e44-e68.   IPF Suspected   Histopath ology Pattern      UIP  Probable UIP  Indeterminate for  UIP  Alternative  diagnosis    UIP  IPF  IPF  IPF  Non-IPF dx   HRCT   Probabe UIP  IPF  IPF  IPF (Likely)**  Non-IPF dx  Pattern  Indeterminate for UIP  IPF  IPF (Likely)**  Indeterminate  for IPF**  Non-IPF dx    Alternative diagnosis  IPF (Likely)**/ non-IPF dx  Non-IPF dx  Non-IPF dx  Non-IPF dx     Idiopathic pulmonary fibrosis diagnosis based upon HRCT and Biopsy paterns.  ** IPF is the likely diagnosis when any of following features are present:  . Moderate-to-severe traction bronchiectasis/bronchiolectasis (defined as mild traction bronchiectasis/bronchiolectasis in four or more lobes including the lingual as a lobe, or moderate to  severe traction bronchiectasis in two or more lobes) in a man over age 64 years or in a woman over age 24 years . Extensive (>30%) reticulation on HRCT and an age >70 years  . Increased neutrophils and/or absence of lymphocytosis in BAL fluid  . Multidisciplinary discussion reaches a confident diagnosis of IPF.   **Indeterminate for IPF  . Without an adequate biopsy is unlikely to be IPF  . With an adequate biopsy may be reclassified to a more specific diagnosis after multidisciplinary discussion and/or additional consultation.   dx = diagnosis; HRCT = high-resolution computed tomography; IPF = idiopathic pulmonary fibrosis; UIP = usual interstitial pneumonia.

## 2019-09-11 ENCOUNTER — Telehealth (HOSPITAL_COMMUNITY): Payer: Self-pay | Admitting: *Deleted

## 2019-09-11 NOTE — Telephone Encounter (Signed)
Called to follow up with patient, she has had a CT scan of lung, echo, and doppler studies to rule out deep vein thrombus.  She continues to be short of breath with any activity and is requiring oxygen all the time.  She meets with [name] 09/16/2019 to discuss results, she is not exercising until she meets with him.

## 2019-09-12 ENCOUNTER — Other Ambulatory Visit: Payer: Self-pay | Admitting: Internal Medicine

## 2019-09-12 DIAGNOSIS — Z8709 Personal history of other diseases of the respiratory system: Secondary | ICD-10-CM

## 2019-09-12 NOTE — Progress Notes (Signed)
No dvt

## 2019-09-12 NOTE — Progress Notes (Signed)
Echo normal

## 2019-09-13 ENCOUNTER — Telehealth: Payer: Self-pay | Admitting: Internal Medicine

## 2019-09-13 NOTE — Telephone Encounter (Signed)
I spoke with the pt and notified of results/recs per MR and she verbalized understanding  Nothing further needed Will keep appt 09/16/19 with [facility]

## 2019-09-13 NOTE — Telephone Encounter (Signed)
She has emphysema with alpha 1 MZ  Plan  - this requires annula PFT monitrign - she is seeing Derl Barrow 09/16/2019 as part of followup

## 2019-09-13 NOTE — Telephone Encounter (Signed)
-----   Message from Len Blalock, [place] sent at 07/09/2019  8:34 AM EST ----- MR please advise on results.  Thanks!

## 2019-09-16 ENCOUNTER — Other Ambulatory Visit: Payer: Self-pay | Admitting: General Surgery

## 2019-09-16 ENCOUNTER — Ambulatory Visit: Payer: BC Managed Care – PPO | Admitting: Primary Care

## 2019-09-16 ENCOUNTER — Other Ambulatory Visit: Payer: Self-pay

## 2019-09-16 ENCOUNTER — Encounter: Payer: Self-pay | Admitting: Primary Care

## 2019-09-16 DIAGNOSIS — J849 Interstitial pulmonary disease, unspecified: Secondary | ICD-10-CM

## 2019-09-16 DIAGNOSIS — E8801 Alpha-1-antitrypsin deficiency: Secondary | ICD-10-CM | POA: Insufficient documentation

## 2019-09-16 DIAGNOSIS — R0609 Other forms of dyspnea: Secondary | ICD-10-CM

## 2019-09-16 DIAGNOSIS — J449 Chronic obstructive pulmonary disease, unspecified: Secondary | ICD-10-CM

## 2019-09-16 DIAGNOSIS — R0602 Shortness of breath: Secondary | ICD-10-CM

## 2019-09-16 DIAGNOSIS — U071 COVID-19: Secondary | ICD-10-CM

## 2019-09-16 DIAGNOSIS — J1282 Pneumonia due to coronavirus disease 2019: Secondary | ICD-10-CM

## 2019-09-16 DIAGNOSIS — J9611 Chronic respiratory failure with hypoxia: Secondary | ICD-10-CM

## 2019-09-16 DIAGNOSIS — R0600 Dyspnea, unspecified: Secondary | ICD-10-CM

## 2019-09-16 DIAGNOSIS — J438 Other emphysema: Secondary | ICD-10-CM

## 2019-09-16 MED ORDER — TRELEGY ELLIPTA 100-62.5-25 MCG/INH IN AEPB: 2.0000 | INHALATION_SPRAY | Freq: Every day | RESPIRATORY_TRACT | 6 refills | Status: DC

## 2019-09-16 NOTE — Assessment & Plan Note (Addendum)
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

## 2019-09-16 NOTE — Assessment & Plan Note (Addendum)
-   Attending pulmonary rehab - Using 2L at rest and 4-5L on exertion to keep O2 >88%

## 2019-09-16 NOTE — Assessment & Plan Note (Addendum)
-   PFTs in Dec 2020 showed FEV1 1.99 (73%), ratio 59, +BD - Continue Trelegy 1 puff daily; prn albuterol rescue inhaler 2 puffs every 4-6 hours as needed for shortness of breath/wheezing

## 2019-09-16 NOTE — Progress Notes (Signed)
$'@Patient'A$  ID: Maria [name], female    DOB: 09/16/1955, 64 y.o.   MRN: [id number]  Chief Complaint  Patient presents with  . Results    Discuss results of HRCT, Echocardiogram, Vascular Ultrasound    Referring provider: [name], MD  HPI:  64 year old female, former smoker quit in 2017 (0-pack-year history).  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  Patient of [name], seen for initial consult/post COVID follow-up on 03/19/2019.  Admitted on February 26, 2019 with several day history of increased weakness, shortness of breath and nausea/diarrhea.  He was diagnosed with COVID-19 and spent a total of 5 days at [facility].  Chest x-ray on admission showed some basal infiltrates.  Treated with oxygen, Solu-Medrol and Advair.  During follow-up visit she reports feeling a lot more fatigued and reports feeling short of breath all the time.  She is maintained on Trelegy for COPD.  Not currently on oxygen.  Labs, HRCT, ONO and echocardiogram ordered.  Trelegy was stopped and she was started on Pulmicort nebulizer twice daily and DuoNeb's 4 times a day and as needed.  Ordered for new nebulizer machine with aero care. Follow-up in 1 to 2 weeks after testing and needs flu shot during that visit.  Previous [facility] pulmonary encounters: 04/03/2019- FU with Volanda Napoleon, NP Presents today for a 2-week follow-up.  Echocardiogram showed normal systolic function with estimated EF 60 to 65%.  Left ventricle diastolic doppler consistent with impaired relaxation-determinant filling pressures.  Right ventricle with normal systolic function, increased wall thickness-systolic pressure could not be assessed.  HRCT is scheduled for 9/29.  Patient called on 9/4 asking about portable oxygen tank order.  Reports that her O2 level has read 84 to 85% after exertion with a heart rate of 110.   She reports that her oxygen level recovers greater than 90% after resting.  Is not currently on oxygen at home will need to be walked in office to qualify for POC. Repeat COVID testing was positive, patient reports that she still has lost her taste and smell with increased shortness of breath.   She is feeling a little better today. Family is checking on her. No other help at home. Reports feeling fine when sitting but her O2 drops as low as 85% on exertion. She is able to recover with rest. Taking prednisone taper as prescribed, feels it has helped her chest tightness. She has dry/hacking cough with no mucus production. She is eating and drinking ok. Denies fever.   04/09/2019- FU with Volanda Napoleon, NP Patient presents today for 1 week follow-up. Unfortunately we were unable to get her set up with home health d/t her insurance. She is here today for qualifying O2 walk. She continues to have some shortness of breath and dry cough. Low energy. Her taste and smell as returned some. She is drinking plenty of fluids. Reports that her cough is not quite as bad, states the more she talks she has to cough. Has trouble sleeping at night d/t her respiratory symptoms. Feels delsym and tessalon perles have been helpful. ONO on RA showed O2 low 70% with baseline 87%. Ambulatory O2 today showed O2 desaturation 84% on RA, requiring 3L. Using [employer] for nebulizer supplies. Denies fever, weight loss, hemoptysis, purulent mucus.   04/22/2019- FU with Volanda Napoleon, NP Patient presents today for 2 week follow-up. Breathing is the same. Continues to have a  dry cough. Completed prednisone 2 days ago. Using incentive spirometry. She has tried to go without her oxygen at home but states that she gets too short winded. 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 Diffuse bronchial wall thickening with moderate  centrilobular and paraseptal emphysema; imaging findings suggestive of underlying COPD. She had COPD symptoms before getting COVID. Her breathing was getting a little worse before her diagnosis.  Sleeping better at night with oxygen. Ambulating with rolling walker, still deconditioned/weak. Due for influenza vaccine today.   07/03/19- FU with [name]  Follow-up emphysema  Follow-up interstitial lung disease post COVID-19 in August 2020 (had elevatd d-dimer) Follow-up chronic hypoxemic respiratory failure secondary to both Maria [name] 64 y.o. -presents for the above issues.  She says her condition is returned to normal.  She is back working as a Forensic psychologist.  However she continues to be still significantly symptomatic.  She had a pulmonary function test June 24, 2019.  I visualized the result.  Shows isolated reduction in diffusion capacity was only moderate at 57%.  FVC and FEV1 are normal.  She continues to be significantly symptomatic as documented below.  She is taking Trelegy.  She says she desaturates to 85% with exertion echocardiogram recently was normal.  She is asking if she should take the Moderna COVID-19 vaccine available in January 2021  08/22/19- FU with [name] Follow-up emphysema - alpha 1 MZ in dec 2020 Follow-up interstitial lung disease post COVID-19 in August 2020 Follow-up chronic hypoxemic respiratory failure secondary to both Ex smoker Echo sept 202 0 - ef 65% and diast dysfn Maria [name] 64 y.o. -presents for follow-up of the above issues.  In the interim she did have serologic work-up for her ILD that was discovered post Covid setting.  Her ANA and rheumatoid factor were trace positive.  Her ESR was 41.  Therefore we committed her to few to several week prednisone taper.  She says she is on a 5-week taper.  She is got 2 more weeks of this.  She says since her last visit she is feeling worse.  The worsening is in terms of shortness of breath.  Also  has dizziness and floaters when she exerts.  In fact on a virtual rehab class she had to get on more oxygen.  Her dyspnea symptom scores are worse than before but cough and fatigue around the same.  She is requiring 3 L now with exertion.  Although in rehab the asked her to increase it to 6 L which helped but this was not documented because of worsening hypoxemia.  Today when we walked her she had to stop several times because of dizziness and fatigue although she did not desaturate.  Her lung function itself in December 2020 shows only isolated reduction in DLCO.  09/16/2019 Patient presents today for 2 week follow-up visit to discuss recent testing results. She is at baseline. No acute complaints.  She is attending pulmonary rehab. Ran out of Trelegy inhaler three days ago, needs refill. Using 2L oxygen at rest and 4-5L on exertion. HRCT showed no evidence of ILD, nonspecific postinfectious/inflammatory scarring right lung base. Echocardiogram was normal. Bilateral lower extremity ultrasound negative for DVT. Alpha-1 showed MZ phenotype. Her father had emphysema. Reviewed all testing with patient at length and answered any questions. Plan repeat PFTs annually, if decline in lung function may qualify  for infusion for alpha-1 deficiency.     SIGNIFICANT TESTING:  Pulmonary: Dec 2020- FEV1 1.99 (73%), ratio 59, +BD, isolated reduction in DLCO 57%  Dec 2020- alpha 1 MZ   Cardiac: 09/09/19- Echocardiogram normal; EF 60-65% Lower extremity doppler- negative for dvt   Imaging: HRCT 08/30/19 1. No compelling evidence of interstitial lung disease. Chronic parenchymal band at the dependent right lung base compatible with nonspecific postinfectious/postinflammatory scarring. 2. Moderate to severe centrilobular and paraseptal emphysema with diffuse bronchial wall thickening, suggesting COPD. 3. Aortic Atherosclerosis (ICD10-I70.0) and Emphysema (ICD10-J43.9).  CT CHEST 04/16/2019 1. Although there are some  fibrotic changes in the lower lobes of the lungs bilaterally, the distribution in the appearance favors areas of post infectious or inflammatory fibrosis. Given the presence of some bronchiectasis, this is technically characterized as probable UIP per current ATS guidelines, however, that is not favored. If there is persistent clinical concern for interstitial lung disease, repeat high-resolution chest CT would be recommended in 12 months to assess for temporal changes in the appearance of the lung parenchyma. 2. Diffuse bronchial wall thickening with moderate centrilobular and paraseptal emphysema; imaging findings suggestive of underlying COPD.  No Known Allergies  Immunization History  Administered Date(s) Administered  . Influenza, Seasonal, Injecte, Preservative Fre 04/28/2008, 04/09/2010  . Influenza,inj,Quad PF,6+ Mos 04/22/2019  . Influenza-Unspecified 06/01/2008, 05/10/2016, 03/18/2017  . Pneumococcal Polysaccharide-23 04/15/2015  . Tdap 11/17/2009    Past Medical History:  Diagnosis Date  . Atherosclerosis 1/0612014   aorta, iliacs and CFA bilaterally no greater than 0-49% - lower arterial doppler.  . Blood transfusion without reported diagnosis 2005  . Cancer ([place]) Ovarian 1978  . Chest pain   . COPD (chronic obstructive pulmonary disease) ([place])   . Emphysema of lung ([place]) 2019  . Oxygen deficiency Sept 2020    Tobacco History: Social History   Tobacco Use  Smoking Status Former Smoker  . Packs/day: 1.00  . Years: 45.00  . Pack years: 45.00  . Types: Cigarettes  . Quit date: 03/18/2016  . Years since quitting: 3.4  Smokeless Tobacco Never Used   Counseling given: Not Answered   Outpatient Medications Prior to Visit  Medication Sig Dispense Refill  . budesonide (PULMICORT) 0.25 MG/2ML nebulizer solution USE 1 VIAL IN NEBULIZER TWICE A DAY 120 mL 1  . diclofenac sodium (VOLTAREN) 1 % GEL Apply 2 g topically 4 (four) times daily as needed (pain).     Marland Kitchen  ipratropium-albuterol (DUONEB) 0.5-2.5 (3) MG/3ML SOLN USE 1 AMPULE IN NEBULIZER 4 TIMES A DAY AS NEEDED    (Dx: J84.112, J44.9) 360 mL 5  . nortriptyline (PAMELOR) 25 MG capsule Take 25 mg by mouth at bedtime.    . OXYGEN Inhale into the lungs. 2 liters at rest and 5 liters with any activity.-    . predniSONE (DELTASONE) 10 MG tablet Take 4 tabs qd x 1 week; then 3 tabs qd x 1 week; then 2 tabs qd x 1 week; then 1 tab qd x 1 week; then 1/2 tab qd x 1 week 75 tablet 0  . Respiratory Therapy Supplies (FLUTTER) DEVI Use as directed 1 each 0  . VENTOLIN HFA 108 (90 Base) MCG/ACT inhaler Inhale 2 puffs into the lungs every 6 (six) hours as needed for shortness of breath.    . Fluticasone-Umeclidin-Vilant (TRELEGY ELLIPTA) 100-62.5-25 MCG/INH AEPB INHALE 1 PUFF ONCE DAILY     No facility-administered medications prior to visit.   Review of Systems  Review of Systems  Constitutional:  Negative.   Respiratory: Positive for shortness of breath and wheezing. Negative for cough and chest tightness.   Cardiovascular: Negative.    Physical Exam  BP 132/70 (BP Location: Left Arm, Patient Position: Sitting, Cuff Size: Normal)   Pulse 91   Temp 98.1 F (36.7 C)   Ht '5\' 6"'$  (1.676 m)   Wt 179 lb (81.2 kg)   SpO2 99% Comment: on room air  BMI 28.89 kg/m  Physical Exam Constitutional:      General: She is not in acute distress.    Appearance: Normal appearance. She is not ill-appearing.  HENT:     Head: Normocephalic and atraumatic.     Mouth/Throat:     Mouth: Mucous membranes are moist.     Pharynx: Oropharynx is clear. No oropharyngeal exudate or posterior oropharyngeal erythema.  Cardiovascular:     Rate and Rhythm: Normal rate and regular rhythm.  Pulmonary:     Effort: Pulmonary effort is normal.     Breath sounds: No wheezing or rhonchi.     Comments: Diminished; wearing pulsed oxygen  Musculoskeletal:     Cervical back: Normal range of motion and neck supple.  Skin:    General: Skin  is warm and dry.  Neurological:     General: No focal deficit present.     Mental Status: She is alert and oriented to person, place, and time. Mental status is at baseline.  Psychiatric:        Mood and Affect: Mood normal.        Behavior: Behavior normal.        Thought Content: Thought content normal.        Judgment: Judgment normal.      Lab Results:  CBC    Component Value Date/Time   WBC 12.3 (H) 08/22/2019 1359   WBC 11.9 (H) 03/19/2019 0956   RBC 4.31 08/22/2019 1359   RBC 4.26 03/19/2019 0956   HGB 13.6 08/22/2019 1359   HCT 39.3 08/22/2019 1359   PLT 381 08/22/2019 1359   MCV 91 08/22/2019 1359   MCH 31.6 08/22/2019 1359   MCH 31.0 03/03/2019 0154   MCHC 34.6 08/22/2019 1359   MCHC 33.3 03/19/2019 0956   RDW 12.5 08/22/2019 1359   LYMPHSABS 2.5 08/22/2019 1359   MONOABS 0.5 03/19/2019 0956   EOSABS 0.0 08/22/2019 1359   BASOSABS 0.0 08/22/2019 1359    BMET    Component Value Date/Time   NA 140 08/22/2019 1359   K 4.3 08/22/2019 1359   CL 103 08/22/2019 1359   CO2 28 08/22/2019 1359   GLUCOSE 116 (H) 08/22/2019 1359   BUN 13 08/22/2019 1359   CREATININE 0.73 08/22/2019 1359   CALCIUM 9.7 08/22/2019 1359   GFRNONAA >60 03/03/2019 0154   GFRAA >60 03/03/2019 0154    BNP No results found for: BNP  ProBNP No results found for: PROBNP  Imaging: CT CHEST HIGH RESOLUTION  Result Date: 08/30/2019 CLINICAL DATA:  Dyspnea on exertion for several weeks with chest pressure and cough. EXAM: CT CHEST WITHOUT CONTRAST TECHNIQUE: Multidetector CT imaging of the chest was performed following the standard protocol without intravenous contrast. High resolution imaging of the lungs, as well as inspiratory and expiratory imaging, was performed. COMPARISON:  04/16/2019 high-resolution chest CT. FINDINGS: Cardiovascular: Normal heart size. No significant pericardial effusion/thickening. Atherosclerotic nonaneurysmal thoracic aorta. Top-normal caliber main pulmonary  artery (3.0 cm diameter), stable. Mediastinum/Nodes: No discrete thyroid nodules. Unremarkable esophagus. No pathologically enlarged axillary, mediastinal or hilar lymph  nodes, noting limited sensitivity for the detection of hilar adenopathy on this noncontrast study. Lungs/Pleura: No pneumothorax. No pleural effusion. Moderate to severe centrilobular and paraseptal emphysema with diffuse bronchial wall thickening. No central airway stenoses. No acute consolidative airspace disease, lung masses or significant pulmonary nodules. No significant lobular air trapping or significant evidence of tracheobronchomalacia on the expiration sequence. Similar to decreased parenchymal band at the dependent right lung base. No significant regions of subpleural reticulation, ground-glass attenuation, traction bronchiectasis, architectural distortion or frank honeycombing. Upper abdomen: Cholecystectomy. Musculoskeletal: No aggressive appearing focal osseous lesions. Surgical hardware from ACDF in the lower cervical spine. Mild thoracic spondylosis. IMPRESSION: 1. No compelling evidence of interstitial lung disease. Chronic parenchymal band at the dependent right lung base compatible with nonspecific postinfectious/postinflammatory scarring. 2. Moderate to severe centrilobular and paraseptal emphysema with diffuse bronchial wall thickening, suggesting COPD. 3. Aortic Atherosclerosis (ICD10-I70.0) and Emphysema (ICD10-J43.9). Electronically Signed   By: Ilona Sorrel M.D.   On: 08/30/2019 08:52   ECHOCARDIOGRAM COMPLETE  Result Date: 09/09/2019    ECHOCARDIOGRAM REPORT   Patient Name:   Maria [name] Date of Exam: 09/09/2019 Medical Rec #:  [id number]      Height:       65.2 in Accession #:    [id number]     Weight:       178.2 lb Date of Birth:  1955/12/06       BSA:          1.889 m Patient Age:    9 years       BP:           112/84 mmHg Patient Gender: F              HR:           84 bpm. Exam Location:  Outpatient Procedure: 2D  Echo and Strain Analysis Indications:    Dyspnea 786.09 / R06.00  History:        Patient has prior history of Echocardiogram examinations, most                 recent 03/22/2019. COPD, Signs/Symptoms:Shortness of Breath and                 Chest Pain; Risk Factors:Former Smoker. History of COVID.  Sonographer:    Darlina Sicilian RDCS Referring Phys: [place]  1. Normal GLS -18.7. Left ventricular ejection fraction, by estimation, is 60 to 65%. The left ventricle has normal function. The left ventricle has no regional wall motion abnormalities. Left ventricular diastolic parameters were normal.  2. Right ventricular systolic function is normal. The right ventricular size is normal.  3. The mitral valve is normal in structure and function. No evidence of mitral valve regurgitation. No evidence of mitral stenosis.  4. The aortic valve is normal in structure and function. Aortic valve regurgitation is not visualized. No aortic stenosis is present.  5. The inferior vena cava is normal in size with greater than 50% respiratory variability, suggesting right atrial pressure of 3 mmHg. FINDINGS  Left Ventricle: Normal GLS -18.7. Left ventricular ejection fraction, by estimation, is 60 to 65%. The left ventricle has normal function. The left ventricle has no regional wall motion abnormalities. The left ventricular internal cavity size was normal  in size. There is no left ventricular hypertrophy. Left ventricular diastolic parameters were normal. Right Ventricle: The right ventricular size is normal. No increase in right ventricular wall thickness. Right ventricular systolic function is normal.  Left Atrium: Left atrial size was normal in size. Right Atrium: Right atrial size was normal in size. Pericardium: There is no evidence of pericardial effusion. Mitral Valve: The mitral valve is normal in structure and function. Normal mobility of the mitral valve leaflets. No evidence of mitral valve  regurgitation. No evidence of mitral valve stenosis. Tricuspid Valve: The tricuspid valve is normal in structure. Tricuspid valve regurgitation is not demonstrated. No evidence of tricuspid stenosis. Aortic Valve: The aortic valve is normal in structure and function. Aortic valve regurgitation is not visualized. No aortic stenosis is present. Pulmonic Valve: The pulmonic valve was normal in structure. Pulmonic valve regurgitation is not visualized. No evidence of pulmonic stenosis. Aorta: The aortic root is normal in size and structure. Venous: The inferior vena cava is normal in size with greater than 50% respiratory variability, suggesting right atrial pressure of 3 mmHg. IAS/Shunts: No atrial level shunt detected by color flow Doppler.  LEFT VENTRICLE PLAX 2D LVIDd:         4.20 cm LVIDs:         3.10 cm LV PW:         0.90 cm LV IVS:        0.90 cm LVOT diam:     1.80 cm LVOT Area:     2.54 cm  RIGHT VENTRICLE RV S prime:     12.30 cm/s TAPSE (M-mode): 1.8 cm LEFT ATRIUM             Index       RIGHT ATRIUM          Index LA diam:        3.20 cm 1.69 cm/m  RA Area:     8.04 cm LA Vol (A2C):   18.4 ml 9.74 ml/m  RA Volume:   15.50 ml 8.21 ml/m LA Vol (A4C):   24.9 ml 13.18 ml/m LA Biplane Vol: 22.5 ml 11.91 ml/m   AORTA Ao Root diam: 2.90 cm MITRAL VALVE MV Area (PHT): 4.21 cm     SHUNTS MV Decel Time: 180 msec     Systemic Diam: 1.80 cm MV E velocity: 119.00 cm/s MV A velocity: 61.80 cm/s MV E/A ratio:  1.93 Jenkins Rouge MD Electronically signed by Jenkins Rouge MD Signature Date/Time: 09/09/2019/4:39:23 PM    Final    VAS [name] LOWER EXTREMITY VENOUS (DVT)  Result Date: 09/10/2019  Lower Venous DVTStudy Indications: SOB. Other Indications: Recent Covid-19 infection. Comparison Study: No prior study on file Performing Technologist: Sharion Dove RVS  Examination Guidelines: A complete evaluation includes B-mode imaging, spectral Doppler, color Doppler, and power Doppler as needed of all accessible  portions of each vessel. Bilateral testing is considered an integral part of a complete examination. Limited examinations for reoccurring indications may be performed as noted. The reflux portion of the exam is performed with the patient in reverse Trendelenburg.  +---------+---------------+---------+-----------+----------+--------------+ RIGHT    CompressibilityPhasicitySpontaneityPropertiesThrombus Aging +---------+---------------+---------+-----------+----------+--------------+ CFV      Full           Yes      Yes                                 +---------+---------------+---------+-----------+----------+--------------+ SFJ      Full                                                        +---------+---------------+---------+-----------+----------+--------------+  FV Prox  Full                                                        +---------+---------------+---------+-----------+----------+--------------+ FV Mid   Full                                                        +---------+---------------+---------+-----------+----------+--------------+ FV DistalFull                                                        +---------+---------------+---------+-----------+----------+--------------+ PFV      Full                                                        +---------+---------------+---------+-----------+----------+--------------+ POP      Full           Yes      Yes                                 +---------+---------------+---------+-----------+----------+--------------+ PTV      Full                                                        +---------+---------------+---------+-----------+----------+--------------+ PERO     Full                                                        +---------+---------------+---------+-----------+----------+--------------+   +---------+---------------+---------+-----------+----------+--------------+ LEFT      CompressibilityPhasicitySpontaneityPropertiesThrombus Aging +---------+---------------+---------+-----------+----------+--------------+ CFV      Full           Yes      Yes                                 +---------+---------------+---------+-----------+----------+--------------+ SFJ      Full                                                        +---------+---------------+---------+-----------+----------+--------------+ FV Prox  Full                                                        +---------+---------------+---------+-----------+----------+--------------+  FV Mid   Full                                                        +---------+---------------+---------+-----------+----------+--------------+ FV DistalFull                                                        +---------+---------------+---------+-----------+----------+--------------+ PFV      Full                                                        +---------+---------------+---------+-----------+----------+--------------+ POP      Full                    Yes                                 +---------+---------------+---------+-----------+----------+--------------+ PTV      Full                                                        +---------+---------------+---------+-----------+----------+--------------+ PERO     Full                                                        +---------+---------------+---------+-----------+----------+--------------+     Summary: BILATERAL: - No evidence of deep vein thrombosis seen in the lower extremities, bilaterally.   *See table(s) above for measurements and observations. Electronically signed by Ruta Hinds MD on 09/10/2019 at 11:47:04 AM.    Final      Assessment & Plan:   Pneumonia due to COVID-19 virus - ILD felt to be post inflammatory/infectious  - 08/30/19 HRCT showed no compelling evidence of ILD, chronic parenchymal band  depended right lung base compatible with nonspecific postinfectious/postinflammatory scarring   Stage 2 moderate COPD by GOLD classification ([place]) - PFTs in Dec 2020 showed FEV1 1.99 (73%), ratio 59, +BD - Continue Trelegy 1 puff daily; prn albuterol rescue inhaler 2 puffs every 4-6 hours as needed for shortness of breath/wheezing   Emphysema due to alpha-1-antitrypsin deficiency ([facility]) - Moderate to severe emphysema from prior smoking history with genetic component - Alpha 1 phenotype MZ in Dec 2020; DLCO 57% on pulmonary function testing - Plan repeat PFTs annually, next due December 2021. If decline in lung function consider infusion for alpha-1 deficiency   Chronic respiratory failure with hypoxia [facility]) - Attending pulmonary rehab - Using 2L at rest and 4-5L on exertion to keep O2 >88%   Martyn Ehrich, NP 09/16/2019

## 2019-09-16 NOTE — Assessment & Plan Note (Addendum)
-   Moderate to severe emphysema from prior smoking history with genetic component - Alpha 1 phenotype MZ in Dec 2020; DLCO 57% on pulmonary function testing - Plan repeat PFTs annually, next due December 2021. If decline in lung function consider infusion for alpha-1 deficiency

## 2019-09-16 NOTE — Patient Instructions (Addendum)
Testing results: - HRCT no compelling evidence of ILD. Banding Right lung base compatible with postinfectious/inflammatory scarring . Moderate to sever emphysema.   - Alpha 1 phenotype MZ (genetic comonent to emphysema as well as smoking history,  there is an infusion for alpha 1 but only qualify if there is a decline in lung function)  - Echocardiogram normal  Recommendations: - Annual PFTs to monitor lung function  - Continue Trelegy 1 puff daily and Albuterol rescue inhaler 2 puffs every 4-6 hours as needed for shortness of breath/wheezing   Orders: FULL PFTs due December 2021 with DLCO   Follow-up: 3 months with [name] or Eustaquio Maize NP

## 2019-09-18 ENCOUNTER — Other Ambulatory Visit: Payer: Self-pay

## 2019-09-18 ENCOUNTER — Telehealth (HOSPITAL_COMMUNITY): Payer: Self-pay | Admitting: *Deleted

## 2019-09-18 ENCOUNTER — Encounter (HOSPITAL_COMMUNITY)
Admission: RE | Admit: 2019-09-18 | Discharge: 2019-09-18 | Disposition: A | Discharge status: Home / Self Care | Payer: BC Managed Care – PPO | Source: Ambulatory Visit | Attending: Internal Medicine | Admitting: Internal Medicine

## 2019-09-18 ENCOUNTER — Encounter: Payer: Self-pay | Admitting: Family Medicine

## 2019-09-18 DIAGNOSIS — Z8616 Personal history of COVID-19: Secondary | ICD-10-CM | POA: Insufficient documentation

## 2019-09-18 NOTE — Telephone Encounter (Signed)
Follow up call to Maria [name], she has been advised that she may resume exercising.  All her testing for DVT's and pulmonary embolism are negative.  She resumed exercising on the nustep 3 minutes with a 1 minute break for 30 minutes.  Today she increased to 4 minutes and then a 1 minute break and will continue adding a minute each day to her exercise time until she reaches 30 minutes without a break.  Encouraged to call [phone] for any questions.

## 2019-09-25 ENCOUNTER — Ambulatory Visit: Payer: BC Managed Care – PPO | Admitting: Family Medicine

## 2019-09-30 ENCOUNTER — Encounter: Payer: Self-pay | Admitting: Family Medicine

## 2019-09-30 ENCOUNTER — Other Ambulatory Visit: Payer: Self-pay

## 2019-09-30 ENCOUNTER — Ambulatory Visit (INDEPENDENT_AMBULATORY_CARE_PROVIDER_SITE_OTHER): Payer: BC Managed Care – PPO | Admitting: Family Medicine

## 2019-09-30 VITALS — BP 124/62 | HR 71 | Temp 97.8°F | Resp 12 | Ht 65.5 in | Wt 179.5 lb

## 2019-09-30 DIAGNOSIS — R7303 Prediabetes: Secondary | ICD-10-CM | POA: Insufficient documentation

## 2019-09-30 DIAGNOSIS — Z13220 Encounter for screening for lipoid disorders: Secondary | ICD-10-CM

## 2019-09-30 DIAGNOSIS — I700 Atherosclerosis of aorta: Secondary | ICD-10-CM | POA: Insufficient documentation

## 2019-09-30 DIAGNOSIS — Z0000 Encounter for general adult medical examination without abnormal findings: Secondary | ICD-10-CM | POA: Diagnosis not present

## 2019-09-30 DIAGNOSIS — S76012D Strain of muscle, fascia and tendon of left hip, subsequent encounter: Secondary | ICD-10-CM | POA: Diagnosis not present

## 2019-09-30 DIAGNOSIS — S76012A Strain of muscle, fascia and tendon of left hip, initial encounter: Secondary | ICD-10-CM | POA: Insufficient documentation

## 2019-09-30 LAB — LIPID PANEL
Cholesterol: 193 mg/dL (ref 0–200)
HDL: 56 mg/dL (ref >39.00)
LDL Cholesterol: 122 mg/dL — ABNORMAL HIGH (ref 0–99)
NonHDL: 136.89
Total CHOL/HDL Ratio: 3
Triglycerides: 76 mg/dL (ref 0.0–149.0)
VLDL: 15.2 mg/dL (ref 0.0–40.0)

## 2019-09-30 MED ORDER — ATORVASTATIN CALCIUM 10 MG PO TABS: 10.0000 mg | ORAL_TABLET | Freq: Every day | ORAL | 3 refills | Status: DC

## 2019-09-30 MED ORDER — ASPIRIN EC 81 MG PO TBEC: 81.0000 mg | DELAYED_RELEASE_TABLET | Freq: Every day | ORAL | 3 refills | Status: DC

## 2019-09-30 NOTE — Progress Notes (Signed)
Annual Exam   Chief Complaint:  Chief Complaint  Patient presents with  . Annual Exam    discuss hip pain x 2 years.    History of Present Illness:  Ms. KERSTEN SALMONS is a 64 y.o. No obstetric history on file. who LMP was No LMP recorded. Patient has had a hysterectomy., presents today for her annual examination.   #Hip pain - feels like the bone is twisting the more that she is walking - has tried medicines and shots before - was primary care and went to an orthopedic  - shots would only last a few days - having to grab her cane to walk - PT made it worse  Nutrition She does get adequate calcium and Vitamin D in her diet. Diet: veggies, red meat, more chicken than anything else, fruit Exercise: rehab - pulmonary  Safety The patient wears seatbelts: yes.     The patient feels safe at home and in their relationships: yes.   Menstrual S/p hysterectomy  GYN She is not sexually active.    Cervical Cancer Screening:   No cervix  Breast Cancer Screening There is no\ FH of breast cancer. There is no\ FH of ovarian cancer. BRCA screening Not Indicated.  Last Mammogram: negative The patient does want a mammogram this year.    Colon Cancer Screening Age 7-75 yo - benefits outweigh the risk. Adults 22-85 yo who have never been screened benefit.  Benefits: 134000 people in 2016 will be diagnosed and 49,000 will die - early detection helps [place]: Complications 2/2 to colonoscopy High Risk (Colonoscopy): genetic disorder (Lynch syndrome or familial adenomatous polyposis), personal hx of IBD, previous adenomatous polyp, or previous colorectal cancer, FamHx start 10 years before the age at diagnosis, increased in males and black race  Options:  FIT - looks for hemoglobin (blood in the stool) - specific and fairly sensitive - must be done annually Cologuard - looks for DNA and blood - more sensitive - therefore can have more false positives, every 3 years Colonoscopy  - every 10 years if normal - sedation, bowl prep, must have someone drive you  Up to date until 2027  Lung Cancer Screening Annual screening for adults age 25-80 yo with 30 year pack history? Yes Current Tobacco user? No Quit less than 15 years ago? Yes Interested in low dose CT for lung cancer screening? Already has CT  Weight Wt Readings from Last 3 Encounters:  09/30/19 179 lb 8 oz (81.4 kg)  09/16/19 179 lb (81.2 kg)  08/27/19 178 lb 4 oz (80.9 kg)   Patient has high BMI  BMI Readings from Last 1 Encounters:  09/30/19 29.42 kg/m     Chronic disease screening Blood pressure monitoring:  BP Readings from Last 3 Encounters:  09/30/19 124/62  09/16/19 132/70  08/27/19 112/84    Lipid Monitoring: Indication for screening: age >4, obesity, diabetes, family hx, CV risk factors.  Lipid screening: yes  Lab Results  Component Value Date   CHOL 161 06/01/2016   HDL 58 06/01/2016   Thor 81 06/01/2016   TRIG 111 10/04/2008   CHOLHDL 4.4 10/04/2008     Diabetes Screening: age >41, overweight, family hx, PCOS, hx of gestational diabetes, at risk ethnicity Diabetes Screening screening: Yes  Lab Results  Component Value Date   HGBA1C 6.2 08/29/2019     Past Medical History:  Diagnosis Date  . Atherosclerosis 1/0612014   aorta, iliacs and CFA bilaterally no greater than 0-49% - lower arterial  doppler.  . Blood transfusion without reported diagnosis 2005  . Cancer ([place]) Ovarian 1978  . Chest pain   . COPD (chronic obstructive pulmonary disease) ([place])   . Emphysema of lung ([place]) 2019  . Oxygen deficiency Sept 2020    Past Surgical History:  Procedure Laterality Date  . ABDOMINAL HYSTERECTOMY  1978  . CHOLECYSTECTOMY  2010  . NECK SURGERY  1999  . OVARIAN CYST REMOVAL  1988  . SPINE SURGERY  Neck 2002    Prior to Admission medications   Medication Sig Start Date End Date Taking? Authorizing Provider  budesonide (PULMICORT) 0.25 MG/2ML nebulizer solution  USE 1 VIAL IN NEBULIZER TWICE A DAY 08/21/19  Yes Brand Males, MD  diclofenac sodium (VOLTAREN) 1 % GEL Apply 2 g topically 4 (four) times daily as needed (pain).  11/26/18 11/26/19 Yes [provider]  Fluticasone-Umeclidin-Vilant (TRELEGY ELLIPTA) 100-62.5-25 MCG/INH AEPB Take 2 puffs by mouth daily before breakfast. 09/16/19  Yes Martyn Ehrich, NP  ipratropium-albuterol (DUONEB) 0.5-2.5 (3) MG/3ML SOLN USE 1 AMPULE IN NEBULIZER 4 TIMES A DAY AS NEEDED    (Dx: O70.962, J44.9) 09/12/19  Yes Brand Males, MD  nortriptyline (PAMELOR) 25 MG capsule Take 25 mg by mouth at bedtime. 12/28/18 12/28/19 Yes [provider]  OXYGEN Inhale into the lungs. 2 liters at rest and 5 liters with any activity.-   Yes [provider]  Respiratory Therapy Supplies (FLUTTER) DEVI Use as directed 04/22/19  Yes Martyn Ehrich, NP  VENTOLIN HFA 108 (90 Base) MCG/ACT inhaler Inhale 2 puffs into the lungs every 6 (six) hours as needed for shortness of breath. 02/23/19  Yes [provider]  aspirin EC 81 MG tablet Take 1 tablet (81 mg total) by mouth daily. 09/30/19   [name], MD  atorvastatin (LIPITOR) 10 MG tablet Take 1 tablet (10 mg total) by mouth daily. 09/30/19   [name], MD    No Known Allergies  Gynecologic History: No LMP recorded. Patient has had a hysterectomy.  Obstetric History: No obstetric history on file.  Social History   Socioeconomic History  . Marital status: Married    Spouse name: Christia Reading ([place])  . Number of children: 3  . Years of education: Some college  . Highest education level: Not on file  Occupational History  . Not on file  Tobacco Use  . Smoking status: Former Smoker    Packs/day: 1.00    Years: 45.00    Pack years: 45.00    Types: Cigarettes    Quit date: 03/18/2016    Years since quitting: 3.5  . Smokeless tobacco: Never Used  Substance and Sexual Activity  . Alcohol use: No    Alcohol/week: 0.0 standard drinks   . Drug use: No  . Sexual activity: Not Currently  Other Topics Concern  . 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 Difficulty of Paying Living Expenses:   Food Insecurity:   . Worried About Charity fundraiser in the Last Year:   . Arboriculturist in the Last Year:   Transportation Needs:   . Film/video editor (Medical):   Marland Kitchen Lack of Transportation (Non-Medical):   Physical Activity:   . Days of Exercise per Week:   . Minutes of Exercise per Session:   Stress:   . Feeling of Stress :   Social Connections:   . Frequency of Communication with Friends and Family:   . Frequency of Social Gatherings with Friends and Family:   . Attends Religious Services:   . Active Member of Clubs or Organizations:   . Attends Archivist Meetings:   Marland Kitchen Marital Status:   Intimate Partner Violence:   . Fear of Current or Ex-Partner:   . Emotionally Abused:   Marland Kitchen Physically Abused:   . Sexually Abused:     Family History  Problem Relation Age of Onset  . Heart failure Mother   . Diabetes Mother   . Kidney disease Mother   . Hypertension Mother   . Arthritis Mother   . Heart disease Mother   . Obesity Mother   . COPD Father   . Hearing loss Father   . Lung cancer Brother   . Asthma Son   . Diabetes Son   . Birth defects Son   . COPD Brother   . COPD Brother     Review of Systems  Constitutional: Negative for chills and fever.  HENT:  Negative for congestion and sore throat.   Eyes: Negative for blurred vision and double vision.  Respiratory: Negative for shortness of breath.   Cardiovascular: Negative for chest pain.  Gastrointestinal: Negative for heartburn, nausea and vomiting.  Genitourinary: Negative.   Musculoskeletal: Positive for joint pain (hip). Negative for myalgias.  Skin: Negative for rash.  Neurological: Negative for dizziness and headaches.  Endo/Heme/Allergies: Does not bruise/bleed easily.  Psychiatric/Behavioral: Negative for depression. The patient is not nervous/anxious.      Physical Exam BP 124/62   Pulse 71   Temp 97.8 F (36.6 C)   Resp 12   Ht 5' 5.5" (1.664 m)   Wt 179 lb 8 oz (81.4 kg)   SpO2 100% Comment: on 3 liters of O2  BMI 29.42 kg/m    BP Readings from Last 3 Encounters:  09/30/19 124/62  09/16/19 132/70  08/27/19 112/84      Physical Exam Constitutional:      General: She is not in acute distress.    Appearance: She is well-developed. She is not diaphoretic.  HENT:     Head: Normocephalic and atraumatic.     Right Ear: External ear normal.     Left Ear: External ear normal.     Nose: Nose normal.  Eyes:     General: No scleral icterus.    Conjunctiva/sclera: Conjunctivae normal.  Cardiovascular:     Rate and Rhythm: Normal rate and regular rhythm.     Heart sounds: No murmur.  Pulmonary:     Effort: Pulmonary effort is normal. No respiratory distress.     Breath sounds: Normal breath sounds. No wheezing.     Comments: Wearing [place] 2L Abdominal:     General: Bowel sounds are normal. There is no distension.     Palpations: Abdomen is  soft. There is no mass.     Tenderness: There is no abdominal tenderness. There is no guarding or rebound.  Musculoskeletal:        General: Normal range of motion.     Cervical back: Neck supple.  Lymphadenopathy:     Cervical: No cervical adenopathy.  Skin:    General: Skin is warm and dry.     Capillary Refill: Capillary  refill takes less than 2 seconds.  Neurological:     Mental Status: She is alert and oriented to person, place, and time.     Deep Tendon Reflexes: Reflexes normal.  Psychiatric:        Behavior: Behavior normal.      Results:  PHQ-9:    Pulmonary Rehab Walk Test from 08/06/2019 in [facility]  PHQ-9 Total Score  6        Assessment: 64 y.o. No obstetric history on file. female here for routine annual physical examination.  Plan: Problem List Items Addressed This Visit      Cardiovascular and Mediastinum   Aortic atherosclerosis ([place])   Relevant Medications   aspirin EC 81 MG tablet   atorvastatin (LIPITOR) 10 MG tablet     Musculoskeletal and Integument   Tear of left gluteus minimus tendon    Reviewed care everywhere with MRI in 02/2019 and noting tear. Advised PT but patient would like ortho second opinion as PT made it worse the first attempt at going. Has also failed several treatments including injections and pain medication.       Relevant Orders   Ambulatory referral to Orthopedic Surgery     Other   Prediabetes    Other Visit Diagnoses    Screening for hyperlipidemia    -  Primary   Relevant Orders   Lipid panel   Annual physical exam          Screening: -- Blood pressure screen normal -- cholesterol screening: will obtain -- Weight screening: overweight: continue to monitor -- Diabetes Screening: prediabetes, will repeat - anticipate it could be related to steroids -- Nutrition: normal - encouraged healthy eating habits  The ASCVD Risk score Mikey Bussing DC Jr., et al., 2013) failed to calculate for the following reasons:   Cannot find a previous HDL lab   Cannot find a previous total cholesterol lab  -- Statin therapy for Age 30-75 with CVD risk >7.5%  Psych -- Depression screening (PHQ-9):    Pulmonary Rehab Walk Test from 08/06/2019 in [facility]  PHQ-9 Total Score  6        Safety -- tobacco screening: not using -- alcohol screening:  low-risk usage. -- no evidence of domestic violence or intimate partner violence.   Cancer Screening -- pap smear not collected per ASCCP guidelines -- family history of breast cancer screening: done. not at high risk. -- Mammogram - information for getting discussed with patient  Immunizations -- flu vaccine up to date -- TDAP q10 years up to date -- PPSV-23 (19-64 with chronic disease or smoking) up to date   Encouraged regular vision (has dentures) and healthy lifestyle    [name], MD

## 2019-09-30 NOTE — Patient Instructions (Addendum)
Check cholesterol today Orthopedic referral today for hip pain Keep eating more veggies      Preventive Care 100-63 Years Old, Female Preventive care refers to visits with your health care provider and lifestyle choices that can promote health and wellness. This includes:  A yearly physical exam. This may also be called an annual well check.  Regular dental visits and eye exams.  Immunizations.  Screening for certain conditions.  Healthy lifestyle choices, such as eating a healthy diet, getting regular exercise, not using drugs or products that contain nicotine and tobacco, and limiting alcohol use. What can I expect for my preventive care visit? Physical exam Your health care provider will check your:  Height and weight. This may be used to calculate body mass index (BMI), which tells if you are at a healthy weight.  Heart rate and blood pressure.  Skin for abnormal spots. Counseling Your health care provider may ask you questions about your:  Alcohol, tobacco, and drug use.  Emotional well-being.  Home and relationship well-being.  Sexual activity.  Eating habits.  Work and work Statistician.  Method of birth control.  Menstrual cycle.  Pregnancy history. What immunizations do I need?  Influenza (flu) vaccine  This is recommended every year. Tetanus, diphtheria, and pertussis (Tdap) vaccine  You may need a Td booster every 10 years. Varicella (chickenpox) vaccine  You may need this if you have not been vaccinated. Zoster (shingles) vaccine  You may need this after age 85. Measles, mumps, and rubella (MMR) vaccine  You may need at least one dose of MMR if you were born in 1957 or later. You may also need a second dose. Pneumococcal conjugate (PCV13) vaccine  You may need this if you have certain conditions and were not previously vaccinated. Pneumococcal polysaccharide (PPSV23) vaccine  You may need one or two doses if you smoke cigarettes or if  you have certain conditions. Meningococcal conjugate (MenACWY) vaccine  You may need this if you have certain conditions. Hepatitis A vaccine  You may need this if you have certain conditions or if you travel or work in places where you may be exposed to hepatitis A. Hepatitis B vaccine  You may need this if you have certain conditions or if you travel or work in places where you may be exposed to hepatitis B. Haemophilus influenzae type b (Hib) vaccine  You may need this if you have certain conditions. Human papillomavirus (HPV) vaccine  If recommended by your health care provider, you may need three doses over 6 months. You may receive vaccines as individual doses or as more than one vaccine together in one shot (combination vaccines). Talk with your health care provider about the risks and benefits of combination vaccines. What tests do I need? Blood tests  Lipid and cholesterol levels. These may be checked every 5 years, or more frequently if you are over 64 years old.  Hepatitis C test.  Hepatitis B test. Screening  Lung cancer screening. You may have this screening every year starting at age 62 if you have a 30-pack-year history of smoking and currently smoke or have quit within the past 15 years.  Colorectal cancer screening. All adults should have this screening starting at age 77 and continuing until age 9. Your health care provider may recommend screening at age 17 if you are at increased risk. You will have tests every 1-10 years, depending on your results and the type of screening test.  Diabetes screening. This is done by  checking your blood sugar (glucose) after you have not eaten for a while (fasting). You may have this done every 1-3 years.  Mammogram. This may be done every 1-2 years. Talk with your health care provider about when you should start having regular mammograms. This may depend on whether you have a family history of breast cancer.  BRCA-related cancer  screening. This may be done if you have a family history of breast, ovarian, tubal, or peritoneal cancers.  Pelvic exam and Pap test. This may be done every 3 years starting at age 37. Starting at age 44, this may be done every 5 years if you have a Pap test in combination with an HPV test. Other tests  Sexually transmitted disease (STD) testing.  Bone density scan. This is done to screen for osteoporosis. You may have this scan if you are at high risk for osteoporosis. Follow these instructions at home: Eating and drinking  Eat a diet that includes fresh fruits and vegetables, whole grains, lean protein, and low-fat dairy.  Take vitamin and mineral supplements as recommended by your health care provider.  Do not drink alcohol if: ? Your health care provider tells you not to drink. ? You are pregnant, may be pregnant, or are planning to become pregnant.  If you drink alcohol: ? Limit how much you have to 0-1 drink a day. ? Be aware of how much alcohol is in your drink. In the [place] one drink equals one 12 oz bottle of beer (355 mL), one 5 oz glass of wine (148 mL), or one 1 oz glass of hard liquor (44 mL). Lifestyle  Take daily care of your teeth and gums.  Stay active. Exercise for at least 30 minutes on 5 or more days each week.  Do not use any products that contain nicotine or tobacco, such as cigarettes, e-cigarettes, and chewing tobacco. If you need help quitting, ask your health care provider.  If you are sexually active, practice safe sex. Use a condom or other form of birth control (contraception) in order to prevent pregnancy and STIs (sexually transmitted infections).  If told by your health care provider, take low-dose aspirin daily starting at age 10. What's next?  Visit your health care provider once a year for a well check visit.  Ask your health care provider how often you should have your eyes and teeth checked.  Stay up to date on all vaccines. This  information is not intended to replace advice given to you by your health care provider. Make sure you discuss any questions you have with your health care provider. Document Revised: 03/15/2018 Document Reviewed: 03/15/2018 Elsevier Patient Education  2020 [employer].

## 2019-09-30 NOTE — Assessment & Plan Note (Signed)
Reviewed care everywhere with MRI in 02/2019 and noting tear. Advised PT but patient would like ortho second opinion as PT made it worse the first attempt at going. Has also failed several treatments including injections and pain medication.

## 2019-10-01 ENCOUNTER — Telehealth: Payer: Self-pay | Admitting: Internal Medicine

## 2019-10-01 NOTE — Telephone Encounter (Signed)
Routing to myself to follow up on. Will give to MR tomorrow 3/17.

## 2019-10-04 NOTE — Telephone Encounter (Signed)
Forms have been given to MR. Will update once received back from him.

## 2019-10-07 DIAGNOSIS — M1612 Unilateral primary osteoarthritis, left hip: Secondary | ICD-10-CM | POA: Diagnosis not present

## 2019-10-07 DIAGNOSIS — M25552 Pain in left hip: Secondary | ICD-10-CM | POA: Diagnosis not present

## 2019-10-07 DIAGNOSIS — S76012A Strain of muscle, fascia and tendon of left hip, initial encounter: Secondary | ICD-10-CM | POA: Diagnosis not present

## 2019-10-07 DIAGNOSIS — G8929 Other chronic pain: Secondary | ICD-10-CM | POA: Diagnosis not present

## 2019-10-07 NOTE — Telephone Encounter (Signed)
Forms sent back to [facility]

## 2019-10-16 ENCOUNTER — Other Ambulatory Visit: Payer: Self-pay | Admitting: Internal Medicine

## 2019-10-16 DIAGNOSIS — J849 Interstitial pulmonary disease, unspecified: Secondary | ICD-10-CM

## 2019-10-22 DIAGNOSIS — J449 Chronic obstructive pulmonary disease, unspecified: Secondary | ICD-10-CM | POA: Diagnosis not present

## 2019-10-23 ENCOUNTER — Encounter: Payer: Self-pay | Admitting: Family Medicine

## 2019-11-07 DIAGNOSIS — J449 Chronic obstructive pulmonary disease, unspecified: Secondary | ICD-10-CM | POA: Diagnosis not present

## 2019-11-18 ENCOUNTER — Encounter: Payer: Self-pay | Admitting: Primary Care

## 2019-11-18 ENCOUNTER — Other Ambulatory Visit: Payer: Self-pay

## 2019-11-18 ENCOUNTER — Ambulatory Visit: Payer: BC Managed Care – PPO | Admitting: Primary Care

## 2019-11-18 ENCOUNTER — Other Ambulatory Visit (INDEPENDENT_AMBULATORY_CARE_PROVIDER_SITE_OTHER): Payer: BC Managed Care – PPO

## 2019-11-18 VITALS — BP 108/68 | HR 77 | Temp 97.9°F | Ht 65.0 in | Wt 186.0 lb

## 2019-11-18 DIAGNOSIS — J9611 Chronic respiratory failure with hypoxia: Secondary | ICD-10-CM | POA: Diagnosis not present

## 2019-11-18 DIAGNOSIS — R0602 Shortness of breath: Secondary | ICD-10-CM | POA: Diagnosis not present

## 2019-11-18 DIAGNOSIS — J438 Other emphysema: Secondary | ICD-10-CM | POA: Diagnosis not present

## 2019-11-18 DIAGNOSIS — J449 Chronic obstructive pulmonary disease, unspecified: Secondary | ICD-10-CM

## 2019-11-18 DIAGNOSIS — E8801 Alpha-1-antitrypsin deficiency: Secondary | ICD-10-CM

## 2019-11-18 LAB — CBC WITH DIFFERENTIAL/PLATELET
Basophils Absolute: 0 10*3/uL (ref 0.0–0.1)
Basophils Relative: 0.4 % (ref 0.0–3.0)
Eosinophils Absolute: 0.2 10*3/uL (ref 0.0–0.7)
Eosinophils Relative: 1.5 % (ref 0.0–5.0)
HCT: 36.2 % (ref 36.0–46.0)
Hemoglobin: 12.1 g/dL (ref 12.0–15.0)
Lymphocytes Relative: 26.7 % (ref 12.0–46.0)
Lymphs Abs: 3.1 10*3/uL (ref 0.7–4.0)
MCHC: 33.5 g/dL (ref 30.0–36.0)
MCV: 93.9 fl (ref 78.0–100.0)
Monocytes Absolute: 0.6 10*3/uL (ref 0.1–1.0)
Monocytes Relative: 5.3 % (ref 3.0–12.0)
Neutro Abs: 7.8 10*3/uL — ABNORMAL HIGH (ref 1.4–7.7)
Neutrophils Relative %: 66.1 % (ref 43.0–77.0)
Platelets: 361 10*3/uL (ref 150.0–400.0)
RBC: 3.86 Mil/uL — ABNORMAL LOW (ref 3.87–5.11)
RDW: 13.4 % (ref 11.5–15.5)
WBC: 11.8 10*3/uL — ABNORMAL HIGH (ref 4.0–10.5)

## 2019-11-18 MED ORDER — AZELASTINE HCL 0.1 % NA SOLN: 1.0000 | Freq: Two times a day (BID) | NASAL | 1 refills | Status: DC

## 2019-11-18 MED ORDER — TRELEGY ELLIPTA 200-62.5-25 MCG/INH IN AEPB: 1.0000 | INHALATION_SPRAY | Freq: Every day | RESPIRATORY_TRACT | 0 refills | Status: DC

## 2019-11-18 MED ORDER — PREDNISONE 10 MG PO TABS: ORAL_TABLET | ORAL | 0 refills | Status: DC

## 2019-11-18 MED ORDER — TRELEGY ELLIPTA 200-62.5-25 MCG/INH IN AEPB: 2.0000 | INHALATION_SPRAY | Freq: Every day | RESPIRATORY_TRACT | 0 refills | Status: DC

## 2019-11-18 NOTE — Progress Notes (Signed)
'@Patient'  ID: Maria [name], female    DOB: 1956/01/13, 64 y.o.   MRN: [id number]  Chief Complaint  Patient presents with  . Acute Visit    C/o increased sob x 1 wk., dry cough,chest tightness/pain across chest to mid-back    Referring provider: [name], MD  HPI: 64 year old female, former smoker quit in 2017 (0-pack-year history).  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  Patient of [name], seen for initial consult/post COVID follow-up on 03/19/2019.  Admitted on February 26, 2019 with several day history of increased weakness, shortness of breath and nausea/diarrhea.  He was diagnosed with COVID-19 and spent a total of 5 days at [facility].  Chest x-ray on admission showed some basal infiltrates.  Treated with oxygen, Solu-Medrol and Advair.  During follow-up visit she reports feeling a lot more fatigued and reports feeling short of breath all the time.  She is maintained on Trelegy for COPD.  Not currently on oxygen.  Labs, HRCT, ONO and echocardiogram ordered.  Trelegy was stopped and she was started on Pulmicort nebulizer twice daily and DuoNeb's 4 times a day and as needed.  Ordered for new nebulizer machine with aero care. Follow-up in 1 to 2 weeks after testing and needs flu shot during that visit.  Previous [facility] pulmonary encounters: 04/03/2019- FU with Volanda Napoleon, NP Presents today for a 2-week follow-up.  Echocardiogram showed normal systolic function with estimated EF 60 to 65%.  Left ventricle diastolic doppler consistent with impaired relaxation-determinant filling pressures.  Right ventricle with normal systolic function, increased wall thickness-systolic pressure could not be assessed.  HRCT is scheduled for 9/29.  Patient called on 9/4 asking about portable oxygen tank order.  Reports that her O2 level has read 84 to 85% after exertion with  a heart rate of 110.  She reports that her oxygen level recovers greater than 90% after resting.  Is not currently on oxygen at home will need to be walked in office to qualify for POC. Repeat COVID testing was positive, patient reports that she still has lost her taste and smell with increased shortness of breath.   She is feeling a little better today. Family is checking on her. No other help at home. Reports feeling fine when sitting but her O2 drops as low as 85% on exertion. She is able to recover with rest. Taking prednisone taper as prescribed, feels it has helped her chest tightness. She has dry/hacking cough with no mucus production. She is eating and drinking ok. Denies fever.   04/09/2019- FU with Volanda Napoleon, NP Patient presents today for 1 week follow-up. Unfortunately we were unable to get her set up with home health d/t her insurance. She is here today for qualifying O2 walk. She continues to have some shortness of breath and dry cough. Low energy. Her taste and smell as returned some. She is drinking plenty of fluids. Reports that her cough is not quite as bad, states the more she talks she has to cough. Has trouble sleeping at night d/t her respiratory symptoms. Feels delsym and tessalon perles have been helpful. ONO on RA showed O2 low 70% with baseline 87%. Ambulatory O2 today showed O2 desaturation 84% on RA, requiring 3L. Using [employer] for nebulizer supplies. Denies fever, weight loss, hemoptysis, purulent mucus.   04/22/2019- FU with Volanda Napoleon, NP Patient presents today for 2 week follow-up. Breathing is  the same. Continues to have a dry cough. Completed prednisone 2 days ago. Using incentive spirometry. She has tried to go without her oxygen at home but states that she gets too short winded. 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 Diffuse bronchial wall thickening  with moderate centrilobular and paraseptal emphysema; imaging findings suggestive of underlying COPD. She had COPD symptoms before getting COVID. Her breathing was getting a little worse before her diagnosis.  Sleeping better at night with oxygen. Ambulating with rolling walker, still deconditioned/weak. Due for influenza vaccine today.   07/03/19- FU with [name]  Follow-up emphysema  Follow-up interstitial lung disease post COVID-19 in August 2020 (had elevatd d-dimer) Follow-up chronic hypoxemic respiratory failure secondary to both Maria [name] 64 y.o. -presents for the above issues.  She says her condition is returned to normal.  She is back working as a Forensic psychologist.  However she continues to be still significantly symptomatic.  She had a pulmonary function test June 24, 2019.  I visualized the result.  Shows isolated reduction in diffusion capacity was only moderate at 57%.  FVC and FEV1 are normal.  She continues to be significantly symptomatic as documented below.  She is taking Trelegy.  She says she desaturates to 85% with exertion echocardiogram recently was normal.  She is asking if she should take the Moderna COVID-19 vaccine available in January 2021  08/22/19- FU with [name] Follow-up emphysema - alpha 1 MZ in dec 2020 Follow-up interstitial lung disease post COVID-19 in August 2020 Follow-up chronic hypoxemic respiratory failure secondary to both Ex smoker Echo sept 202 0 - ef 65% and diast dysfn Maria [name] 64 y.o. -presents for follow-up of the above issues.  In the interim she did have serologic work-up for her ILD that was discovered post Covid setting.  Her ANA and rheumatoid factor were trace positive.  Her ESR was 41.  Therefore we committed her to few to several week prednisone taper.  She says she is on a 5-week taper.  She is got 2 more weeks of this.  She says since her last visit she is feeling worse.  The worsening is in terms of shortness of  breath.  Also has dizziness and floaters when she exerts.  In fact on a virtual rehab class she had to get on more oxygen.  Her dyspnea symptom scores are worse than before but cough and fatigue around the same.  She is requiring 3 L now with exertion.  Although in rehab the asked her to increase it to 6 L which helped but this was not documented because of worsening hypoxemia.  Today when we walked her she had to stop several times because of dizziness and fatigue although she did not desaturate.  Her lung function itself in December 2020 shows only isolated reduction in DLCO.  09/16/2019 Patient presents today for 2 week follow-up visit to discuss recent testing results. She is at baseline. No acute complaints.  She is attending pulmonary rehab. Ran out of Trelegy inhaler three days ago, needs refill. Using 2L oxygen at rest and 4-5L on exertion. HRCT showed no evidence of ILD, nonspecific postinfectious/inflammatory scarring right lung base. Echocardiogram was normal. Bilateral lower extremity ultrasound negative for DVT. Alpha-1 showed MZ phenotype. Her father had emphysema. Reviewed all testing with patient at length and answered any questions. Plan repeat PFTs annually, if  decline in lung function may qualify for infusion for alpha-1 deficiency.    11/18/2019  Patient presents today with reports of increased shortness of breath x 1 week. Prior to that her breathing was mostly baseline. She has some days better than others. Associated chest tightness and dry cough. Experiences tightness across her chest which is worse when lying down. She feels better when sleeping in recliner. Reports constant postnasal drip.Sill doing pulmonary rehab over the phone and during exercises at home. Weight fluctuates.    Imaging: 08/30/19 HRCT - no evidence of ILD, nonspecific postinfectious/inflammatory scarring right lung base. Moderate to severe centrilobular and paraseptal emphysema with diffuse bronchial wall  thickening, suggesting COPD  PFTs:  06/24/19- Post FVC 3.39 (96%), FEV1 1.99 (73%), ratio 59, +BD response, DLCO 57% Moderate obstructive airway disease with reversibility. Moderate diffusion defect  No Known Allergies  Immunization History  Administered Date(s) Administered  . Influenza, Seasonal, Injecte, Preservative Fre 04/28/2008, 04/09/2010  . Influenza,inj,Quad PF,6+ Mos 04/22/2019  . Influenza-Unspecified 06/01/2008, 05/10/2016, 03/18/2017  . Moderna SARS-COVID-2 Vaccination 07/30/2019, 08/28/2019  . Pneumococcal Polysaccharide-23 04/15/2015  . Tdap 11/17/2009    Past Medical History:  Diagnosis Date  . Atherosclerosis 1/0612014   aorta, iliacs and CFA bilaterally no greater than 0-49% - lower arterial doppler.  . Blood transfusion without reported diagnosis 2005  . Cancer ([place]) Ovarian 1978  . Chest pain   . COPD (chronic obstructive pulmonary disease) ([place])   . Emphysema of lung ([place]) 2019  . Oxygen deficiency Sept 2020    Tobacco History: Social History   Tobacco Use  Smoking Status Former Smoker  . Packs/day: 1.00  . Years: 45.00  . Pack years: 45.00  . Types: Cigarettes  . Quit date: 03/18/2016  . Years since quitting: 3.6  Smokeless Tobacco Never Used   Counseling given: Not Answered   Outpatient Medications Prior to Visit  Medication Sig Dispense Refill  . aspirin EC 81 MG tablet Take 1 tablet (81 mg total) by mouth daily. 90 tablet 3  . atorvastatin (LIPITOR) 10 MG tablet Take 1 tablet (10 mg total) by mouth daily. 90 tablet 3  . diclofenac sodium (VOLTAREN) 1 % GEL Apply 2 g topically 4 (four) times daily as needed (pain).     Marland Kitchen ipratropium-albuterol (DUONEB) 0.5-2.5 (3) MG/3ML SOLN USE 1 AMPULE IN NEBULIZER 4 TIMES A DAY AS NEEDED    (Dx: J84.112, J44.9) 360 mL 5  . nortriptyline (PAMELOR) 25 MG capsule Take 25 mg by mouth at bedtime.    . OXYGEN Inhale into the lungs. 2 liters at rest and 5 liters with any activity.-    . Respiratory Therapy  Supplies (FLUTTER) DEVI Use as directed 1 each 0  . VENTOLIN HFA 108 (90 Base) MCG/ACT inhaler Inhale 2 puffs into the lungs every 6 (six) hours as needed for shortness of breath.    . budesonide (PULMICORT) 0.25 MG/2ML nebulizer solution USE 1 VIAL IN NEBULIZER TWICE A DAY 120 mL 1  . Fluticasone-Umeclidin-Vilant (TRELEGY ELLIPTA) 100-62.5-25 MCG/INH AEPB Take 2 puffs by mouth daily before breakfast. 60 each 6   No facility-administered medications prior to visit.    Review of Systems  Review of Systems  Constitutional: Positive for unexpected weight change.  Respiratory: Positive for cough, chest tightness and shortness of breath.   Cardiovascular: Positive for leg swelling. Negative for chest pain.   Physical Exam  BP 108/68 (BP Location: Right Arm, Cuff Size: Normal)   Pulse 77   Temp 97.9 F (36.6  C) (Temporal)   Ht '5\' 5"'  (1.651 m)   Wt 186 lb (84.4 kg)   SpO2 100%   BMI 30.95 kg/m  Physical Exam Constitutional:      Appearance: Normal appearance.  HENT:     Head: Normocephalic and atraumatic.  Cardiovascular:     Rate and Rhythm: Normal rate.     Comments: Trace BLE edema Pulmonary:     Effort: Pulmonary effort is normal.     Breath sounds: No stridor. No wheezing.     Comments: Diminished t/o  Musculoskeletal:     Cervical back: Normal range of motion and neck supple.  Neurological:     General: No focal deficit present.     Mental Status: She is alert and oriented to person, place, and time. Mental status is at baseline.  Psychiatric:        Mood and Affect: Mood normal.        Behavior: Behavior normal.        Thought Content: Thought content normal.        Judgment: Judgment normal.      Lab Results:  CBC    Component Value Date/Time   WBC 12.3 (H) 08/22/2019 1359   WBC 11.9 (H) 03/19/2019 0956   RBC 4.31 08/22/2019 1359   RBC 4.26 03/19/2019 0956   HGB 13.6 08/22/2019 1359   HCT 39.3 08/22/2019 1359   PLT 381 08/22/2019 1359   MCV 91  08/22/2019 1359   MCH 31.6 08/22/2019 1359   MCH 31.0 03/03/2019 0154   MCHC 34.6 08/22/2019 1359   MCHC 33.3 03/19/2019 0956   RDW 12.5 08/22/2019 1359   LYMPHSABS 2.5 08/22/2019 1359   MONOABS 0.5 03/19/2019 0956   EOSABS 0.0 08/22/2019 1359   BASOSABS 0.0 08/22/2019 1359    BMET    Component Value Date/Time   NA 140 08/22/2019 1359   K 4.3 08/22/2019 1359   CL 103 08/22/2019 1359   CO2 28 08/22/2019 1359   GLUCOSE 116 (H) 08/22/2019 1359   BUN 13 08/22/2019 1359   CREATININE 0.73 08/22/2019 1359   CALCIUM 9.7 08/22/2019 1359   GFRNONAA >60 03/03/2019 0154   GFRAA >60 03/03/2019 0154    BNP No results found for: BNP  ProBNP No results found for: PROBNP  Imaging: No results found.   Assessment & Plan:   Stage 2 moderate COPD by GOLD classification ([place]) - Acute COPD exacerbation of dyspnea. No bronchitis symptoms. - Sample Trelegy 200, instructed to take ONE puff daily  - RX prednisone taper (27m x 3 days; 324mx 3 days; 2060m 3 days; 2m41m3 days) - Continue Duoneb nebulizer every 6 hours as needed for shortness of breath/wheezing  - Checking labs to rule out other causes of dyspnea   Emphysema due to alpha-1-antitrypsin deficiency (HCC) - Alpha 1 phenotype Mz - Moderate obstruction with reversibility, DLCO 57% on pulmonary function testing in December 2020 - Plan repeat PFTs annually next due December 2021. If decline consider influsion for alpha 1 deficiency   Chronic respiratory failure with hypoxia (HCC) - O2 100% pulsed oxygen  - Attending pulmonary rehab virtually and during exercises at home    ElizMartyn Ehrich 11/18/2019

## 2019-11-18 NOTE — Patient Instructions (Addendum)
Recommendations: Change Trelegy 200- take one puff daily (rinse mouth after use) Stop Pulmicort nebulizer twice daily  Continue Duoneb nebulizer every 6 hours as needed for shortness of breath/wheezing  Prednisone taper as prescribed (40mg  x 3 days; 30mg  x 3 days; 20mg  x 3 days; 10mg  x 3 days) Add Azelastin nasal spray daily  Labs: BNP, CBC and IgE ([place] office)  Follow-up: 1-2 weeks with Maria Maize NP (televisit or office visit is fine)

## 2019-11-18 NOTE — Assessment & Plan Note (Addendum)
-   Acute COPD exacerbation of dyspnea. No bronchitis symptoms. - Sample Trelegy 200, instructed to take ONE puff daily  - RX prednisone taper (40mg  x 3 days; 30mg  x 3 days; 20mg  x 3 days; 10mg  x 3 days) - Continue Duoneb nebulizer every 6 hours as needed for shortness of breath/wheezing  - Checking labs to rule out other causes of dyspnea

## 2019-11-18 NOTE — Assessment & Plan Note (Signed)
-   Alpha 1 phenotype Mz - Moderate obstruction with reversibility, DLCO 57% on pulmonary function testing in December 2020 - Plan repeat PFTs annually next due December 2021. If decline consider influsion for alpha 1 deficiency

## 2019-11-18 NOTE — Assessment & Plan Note (Addendum)
-   O2 100% pulsed oxygen  - Attending pulmonary rehab virtually and during exercises at home

## 2019-11-19 LAB — BRAIN NATRIURETIC PEPTIDE: Pro B Natriuretic peptide (BNP): 53 pg/mL (ref 0.0–100.0)

## 2019-11-19 LAB — IGE: IgE (Immunoglobulin E), Serum: 97 kU/L (ref <=114)

## 2019-11-21 DIAGNOSIS — J449 Chronic obstructive pulmonary disease, unspecified: Secondary | ICD-10-CM | POA: Diagnosis not present

## 2019-11-26 ENCOUNTER — Telehealth (INDEPENDENT_AMBULATORY_CARE_PROVIDER_SITE_OTHER): Payer: BC Managed Care – PPO | Admitting: Primary Care

## 2019-11-26 ENCOUNTER — Encounter: Payer: Self-pay | Admitting: Primary Care

## 2019-11-26 DIAGNOSIS — J449 Chronic obstructive pulmonary disease, unspecified: Secondary | ICD-10-CM | POA: Diagnosis not present

## 2019-11-26 MED ORDER — MONTELUKAST SODIUM 10 MG PO TABS: 10.0000 mg | ORAL_TABLET | Freq: Every day | ORAL | 6 refills | Status: DC

## 2019-11-26 NOTE — Patient Instructions (Addendum)
Orders: Please move up PFTs 4-6 weeks  Rx: Start Singulair 10mg  at bedtime  Recommendations: Continue Trelegy 200 one puff daily Continue flutter valve 5-10 breaths an hours while awake (or 3-4 times a day if possible) Use Duoneb nebulizer three times a day  Continue pulmonary rehab exercises   Follow-up: Next available with [name]

## 2019-11-26 NOTE — Progress Notes (Signed)
Virtual Visit via Video Note  I connected with Maria [name] on 11/26/19 at  2:00 PM EDT by a video enabled telemedicine application and verified that I am speaking with the correct person using two identifiers.  Location: Patient: Home Provider: Office   I discussed the limitations of evaluation and management by telemedicine and the availability of in person appointments. The patient expressed understanding and agreed to proceed.  History of Present Illness:  64 year old female, former smoker quit in 2017 (0-pack-year history).  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  Patient of [name], seen for initial consult/post COVID follow-up on 03/19/2019.  Admitted on February 26, 2019 with several day history of increased weakness, shortness of breath and nausea/diarrhea.  He was diagnosed with COVID-19 and spent a total of 5 days at [facility].  Chest x-ray on admission showed some basal infiltrates.  Treated with oxygen, Solu-Medrol and Advair.  During follow-up visit she reports feeling a lot more fatigued and reports feeling short of breath all the time.  She is maintained on Trelegy for COPD.  Not currently on oxygen.  Labs, HRCT, ONO and echocardiogram ordered.  Trelegy was stopped and she was started on Pulmicort nebulizer twice daily and DuoNeb's 4 times a day and as needed.  Ordered for new nebulizer machine with aero care. Follow-up in 1 to 2 weeks after testing and needs flu shot during that visit.  Previous [facility] pulmonary encounters: 04/03/2019- FU with Volanda Napoleon, NP Presents today for a 2-week follow-up.  Echocardiogram showed normal systolic function with estimated EF 60 to 65%.  Left ventricle diastolic doppler consistent with impaired relaxation-determinant filling pressures.  Right ventricle with normal systolic function, increased wall  thickness-systolic pressure could not be assessed.  HRCT is scheduled for 9/29.  Patient called on 9/4 asking about portable oxygen tank order.  Reports that her O2 level has read 84 to 85% after exertion with a heart rate of 110.  She reports that her oxygen level recovers greater than 90% after resting.  Is not currently on oxygen at home will need to be walked in office to qualify for POC. Repeat COVID testing was positive, patient reports that she still has lost her taste and smell with increased shortness of breath.   She is feeling a little better today. Family is checking on her. No other help at home. Reports feeling fine when sitting but her O2 drops as low as 85% on exertion. She is able to recover with rest. Taking prednisone taper as prescribed, feels it has helped her chest tightness. She has dry/hacking cough with no mucus production. She is eating and drinking ok. Denies fever.   04/09/2019- FU with Volanda Napoleon, NP Patient presents today for 1 week follow-up. Unfortunately we were unable to get her set up with home health d/t her insurance. She is here today for qualifying O2 walk. She continues to have some shortness of breath and dry cough. Low energy. Her taste and smell as returned some. She is drinking plenty of fluids. Reports that her cough is not quite as bad, states the more she talks she has to cough. Has trouble sleeping at night d/t her respiratory symptoms. Feels delsym and tessalon perles have been helpful. ONO on RA showed O2 low 70% with baseline 87%. Ambulatory O2 today showed O2 desaturation 84% on RA, requiring 3L. Using [employer] for nebulizer supplies. Denies fever,  weight loss, hemoptysis, purulent mucus.   04/22/2019- FU with Volanda Napoleon, NP Patient presents today for 2 week follow-up. Breathing is the same. Continues to have a dry cough. Completed prednisone 2 days ago. Using incentive spirometry. She has tried to go without her oxygen at home but states that she gets too short  winded. 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 Diffuse bronchial wall thickening with moderate centrilobular and paraseptal emphysema; imaging findings suggestive of underlying COPD. She had COPD symptoms before getting COVID. Her breathing was getting a little worse before her diagnosis.  Sleeping better at night with oxygen. Ambulating with rolling walker, still deconditioned/weak. Due for influenza vaccine today.   07/03/19- FU with [name]  Follow-up emphysema  Follow-up interstitial lung disease post COVID-19 in August 2020 (had elevatd d-dimer) Follow-up chronic hypoxemic respiratory failure secondary to both Maria [name] 64 y.o. -presents for the above issues.  She says her condition is returned to normal.  She is back working as a Forensic psychologist.  However she continues to be still significantly symptomatic.  She had a pulmonary function test June 24, 2019.  I visualized the result.  Shows isolated reduction in diffusion capacity was only moderate at 57%.  FVC and FEV1 are normal.  She continues to be significantly symptomatic as documented below.  She is taking Trelegy.  She says she desaturates to 85% with exertion echocardiogram recently was normal.  She is asking if she should take the Moderna COVID-19 vaccine available in January 2021  08/22/19- FU with [name] Follow-up emphysema - alpha 1 MZ in dec 2020 Follow-up interstitial lung disease post COVID-19 in August 2020 Follow-up chronic hypoxemic respiratory failure secondary to both Ex smoker Echo sept 202 0 - ef 65% and diast dysfn Maria [name] 64 y.o. -presents for follow-up of the above issues.  In the interim she did have serologic work-up for her ILD that was discovered post Covid setting.  Her ANA and rheumatoid factor were trace positive.  Her ESR was 41.  Therefore we committed  her to few to several week prednisone taper.  She says she is on a 5-week taper.  She is got 2 more weeks of this.  She says since her last visit she is feeling worse.  The worsening is in terms of shortness of breath.  Also has dizziness and floaters when she exerts.  In fact on a virtual rehab class she had to get on more oxygen.  Her dyspnea symptom scores are worse than before but cough and fatigue around the same.  She is requiring 3 L now with exertion.  Although in rehab the asked her to increase it to 6 L which helped but this was not documented because of worsening hypoxemia.  Today when we walked her she had to stop several times because of dizziness and fatigue although she did not desaturate.  Her lung function itself in December 2020 shows only isolated reduction in DLCO.  09/16/2019 Patient presents today for 2 week follow-up visit to discuss recent testing results. She is at baseline. No acute complaints.  She is attending pulmonary rehab. Ran out of Trelegy inhaler three days ago, needs refill. Using 2L oxygen at rest and 4-5L on exertion. HRCT showed no evidence of ILD, nonspecific postinfectious/inflammatory scarring right lung base. Echocardiogram was normal. Bilateral lower extremity ultrasound negative for DVT. Alpha-1 showed MZ  phenotype. Her father had emphysema. Reviewed all testing with patient at length and answered any questions. Plan repeat PFTs annually, if decline in lung function may qualify for infusion for alpha-1 deficiency.    11/18/2019  Patient presents today with reports of increased shortness of breath x 1 week. Prior to that her breathing was mostly baseline. She has some days better than others. Associated chest tightness and dry cough. Experiences tightness across her chest which is worse when lying down. She feels better when sleeping in recliner. Reports constant postnasal drip.Sill doing pulmonary rehab over the phone and during exercises at home. Weight fluctuates.    11/26/2019 Patient contacted today for 2 week follow-up. Her breathing is the same. Reports that her cough is a little worse. She is only taking mucinex in the evening. She did not notice an improvement with prednisone taper. She was previously using two puffs of 100 Trelegy, so no change with new sample of Trelegy 200. She has a flutter valve, uses this in the morning and in the evening twice. She is still attending pulmonary rehab.     Observations/Objective:  - Appears well, able to speak in full sentences - Wearing oxygen   Imaging: 08/30/19 HRCT - no evidence of ILD, nonspecific postinfectious/inflammatory scarring right lung base. Moderate to severe centrilobular and paraseptal emphysema with diffuse bronchial wall thickening, suggesting COPD  PFTs:  06/24/19- Post FVC 3.39 (96%), FEV1 1.99 (73%), ratio 59, +BD response, DLCO 57% Moderate obstructive airway disease with reversibility. Moderate diffusion defect  Assessment and Plan:  Emphysema/ COPD - Increased shortness of breath over the last year - Light tobacco use. Hx Alpha 1 antitrypsin deficiency, phenotype MZ - Continue Trelegy 200 one puff daily; duoneb q6-8 prn shortness of breath/wheezing - Encourager patient to use mucinex 652m twice daily and flutter valve 5-10 breaths every hour while awake  - Needs repeat PFTs, if decline consider infusion  - Rx Singulair 122mat bedtime  Covid-19 pneumonia: - HRCT in February showed no evidence of ILD, nonspecific postinfectious/inflammatory scarring RLL. Moderate to severe centrilobular emphysema   Follow Up Instructions:   - FU next MR, move up PFT  I discussed the assessment and treatment plan with the patient. The patient was provided an opportunity to ask questions and all were answered. The patient agreed with the plan and demonstrated an understanding of the instructions.   The patient was advised to call back or seek an in-person evaluation if the symptoms worsen or  if the condition fails to improve as anticipated.  I provided 18 minutes of non-face-to-face time during this encounter.   ElMartyn EhrichNP

## 2019-11-28 ENCOUNTER — Telehealth: Payer: Self-pay

## 2019-11-28 NOTE — Telephone Encounter (Signed)
Left a voicemail for patient to call our office back to schedule a PFT in 4-6 weeks and next available with Dr.Ramaswamy.

## 2019-12-05 NOTE — Telephone Encounter (Signed)
I'm out of the office, can you forward to AOD or their doctor. Stop Singulair

## 2019-12-05 NOTE — Telephone Encounter (Signed)
Maria [name],  I can't take the medicine Singulair that you gave me for the asthma. I have crazy dreams and I am very ill. I snap off before I know what happened. With my job, I can't do that. The pressure is still in my chest and the sharp pain that was there before is back not constant but steady.  Thanks Cindy   Please advise

## 2019-12-05 NOTE — Telephone Encounter (Signed)
  I can't take the medicine Singulair that you gave me for the asthma. I have crazy dreams and I am very ill. I snap off before I know what happened. With my job, I can't do that. The pressure is still in my chest and the sharp pain that was there before is back not constant but steady.  Thanks Cindy   Please advise. Patient was last seen by [facility] she is currently out of the office.

## 2019-12-06 NOTE — Telephone Encounter (Signed)
Stop singulair.   If pains in chest/back persist before next appt or get worse -> make face to face to office or go to ER/Urgent care

## 2019-12-07 DIAGNOSIS — J449 Chronic obstructive pulmonary disease, unspecified: Secondary | ICD-10-CM | POA: Diagnosis not present

## 2019-12-17 ENCOUNTER — Encounter: Payer: Self-pay | Admitting: Primary Care

## 2019-12-17 ENCOUNTER — Ambulatory Visit: Payer: BC Managed Care – PPO | Admitting: Primary Care

## 2019-12-17 ENCOUNTER — Other Ambulatory Visit: Payer: Self-pay

## 2019-12-17 VITALS — BP 138/84 | HR 78 | Temp 97.9°F | Ht 63.0 in | Wt 185.2 lb

## 2019-12-17 DIAGNOSIS — J441 Chronic obstructive pulmonary disease with (acute) exacerbation: Secondary | ICD-10-CM

## 2019-12-17 DIAGNOSIS — J438 Other emphysema: Secondary | ICD-10-CM

## 2019-12-17 DIAGNOSIS — E8801 Alpha-1-antitrypsin deficiency: Secondary | ICD-10-CM

## 2019-12-17 DIAGNOSIS — J449 Chronic obstructive pulmonary disease, unspecified: Secondary | ICD-10-CM | POA: Diagnosis not present

## 2019-12-17 DIAGNOSIS — J9611 Chronic respiratory failure with hypoxia: Secondary | ICD-10-CM

## 2019-12-17 MED ORDER — DOXYCYCLINE HYCLATE 100 MG PO TABS: 100.0000 mg | ORAL_TABLET | Freq: Two times a day (BID) | ORAL | 0 refills | Status: AC

## 2019-12-17 NOTE — Progress Notes (Signed)
Pt was walked on 3L of oxygen pt walked at slow pace . Pt was wearing pulse oxygen. Pt maintain levels on the 3Lof pulse oxygen//sb

## 2019-12-17 NOTE — Patient Instructions (Addendum)
Rx: Doxycycline 1 tab twice daily x 7 days   Recommendations: Continue Trelegy 1 puff daily in the morning (rinse mouth after use) Continue Duoneb 2-3 times a day for breakthrough shortness of breath or wheezing  Continue Mucinex 600-1200mg  twice daily with full glass of water  Continue Incentive spirometry and Flutter valve three times a day  Continue Pulmonary rehab exercises as directed   Follow-up: 4-6 weeks after PFTs with Dr. Brantley Persons or NP     Chronic Obstructive Pulmonary Disease Chronic obstructive pulmonary disease (COPD) is a long-term (chronic) lung problem. When you have COPD, it is hard for air to get in and out of your lungs. Usually the condition gets worse over time, and your lungs will never return to normal. There are things you can do to keep yourself as healthy as possible.  Your doctor may treat your condition with: ? Medicines. ? Oxygen. ? Lung surgery.  Your doctor may also recommend: ? Rehabilitation. This includes steps to make your body work better. It may involve a team of specialists. ? Quitting smoking, if you smoke. ? Exercise and changes to your diet. ? Comfort measures (palliative care). Follow these instructions at home: Medicines  Take over-the-counter and prescription medicines only as told by your doctor.  Talk to your doctor before taking any cough or allergy medicines. You may need to avoid medicines that cause your lungs to be dry. Lifestyle  If you smoke, stop. Smoking makes the problem worse. If you need help quitting, ask your doctor.  Avoid being around things that make your breathing worse. This may include smoke, chemicals, and fumes.  Stay active, but remember to rest as well.  Learn and use tips on how to relax.  Make sure you get enough sleep. Most adults need at least 7 hours of sleep every night.  Eat healthy foods. Eat smaller meals more often. Rest before meals. Controlled breathing Learn and use tips on how to  control your breathing as told by your doctor. Try:  Breathing in (inhaling) through your nose for 1 second. Then, pucker your lips and breath out (exhale) through your lips for 2 seconds.  Putting one hand on your belly (abdomen). Breathe in slowly through your nose for 1 second. Your hand on your belly should move out. Pucker your lips and breathe out slowly through your lips. Your hand on your belly should move in as you breathe out.  Controlled coughing Learn and use controlled coughing to clear mucus from your lungs. Follow these steps: 1. Lean your head a little forward. 2. Breathe in deeply. 3. Try to hold your breath for 3 seconds. 4. Keep your mouth slightly open while coughing 2 times. 5. Spit any mucus out into a tissue. 6. Rest and do the steps again 1 or 2 times as needed. General instructions  Make sure you get all the shots (vaccines) that your doctor recommends. Ask your doctor about a flu shot and a pneumonia shot.  Use oxygen therapy and pulmonary rehabilitation if told by your doctor. If you need home oxygen therapy, ask your doctor if you should buy a tool to measure your oxygen level (oximeter).  Make a COPD action plan with your doctor. This helps you to know what to do if you feel worse than usual.  Manage any other conditions you have as told by your doctor.  Avoid going outside when it is very hot, cold, or humid.  Avoid people who have a sickness you can catch (  contagious).  Keep all follow-up visits as told by your doctor. This is important. Contact a doctor if:  You cough up more mucus than usual.  There is a change in the color or thickness of the mucus.  It is harder to breathe than usual.  Your breathing is faster than usual.  You have trouble sleeping.  You need to use your medicines more often than usual.  You have trouble doing your normal activities such as getting dressed or walking around the house. Get help right away if:  You have  shortness of breath while resting.  You have shortness of breath that stops you from: ? Being able to talk. ? Doing normal activities.  Your chest hurts for longer than 5 minutes.  Your skin color is more blue than usual.  Your pulse oximeter shows that you have low oxygen for longer than 5 minutes.  You have a fever.  You feel too tired to breathe normally. Summary  Chronic obstructive pulmonary disease (COPD) is a long-term lung problem.  The way your lungs work will never return to normal. Usually the condition gets worse over time. There are things you can do to keep yourself as healthy as possible.  Take over-the-counter and prescription medicines only as told by your doctor.  If you smoke, stop. Smoking makes the problem worse. This information is not intended to replace advice given to you by your health care provider. Make sure you discuss any questions you have with your health care provider. Document Revised: 06/16/2017 Document Reviewed: 08/08/2016 Elsevier Patient Education  [place]ia Inhibitor Injection What is this medicine? ALPHA-1-PROTEINASE INHIBITOR (AL fa - 1 PRO tee nase in HIB i tor) is a drug that is used to replace an enzyme in patients with lung problems caused by low levels of alpha-1 antitrypsin (ATA). It is not a cure. This medicine may be used for other purposes; ask your health care provider or pharmacist if you have questions. COMMON BRAND NAME(S): Aralast, Aralast NP, Merita Norton, Prolastin, Prolastin C, Zemaira What should I tell my health care provider before I take this medicine? They need to know if you have any of these conditions:  IgA (immunoglobulin A) deficiency  an unusual or allergic reaction to alpha-1-proteinase inhibitor, other medicines, foods, dyes, or preservatives  pregnant or trying to get pregnant  breast-feeding How should I use this medicine? This medicine is for infusion into a vein. Your  healthcare professional will decide if infusion in your home is right for you. You should be trained on how to do infusions by your healthcare professional. Follow the directions on the prescription label. Do not use your medicine more often than directed. It is important that you put your used needles and supplies in a special sharps container. Do not put them in a trash can. If you do not have a sharps container, call your pharmacist or healthcare provider to get one. Talk to your pediatrician regarding the use of this medicine in children. Special care may be needed. Overdosage: If you think you have taken too much of this medicine contact a poison control center or emergency room at once. NOTE: This medicine is only for you. Do not share this medicine with others. What if I miss a dose? This medicine is used once a week. It is important not to miss your dose. Call your health care professional if you are unable to keep an appointment. If you use this medicine at home and  you miss a dose, use it as soon as you can and get back to a weekly schedule. Call your healthcare professional if you are not sure what to do about a missed dose. What may interact with this medicine? Interactions are not expected. This list may not describe all possible interactions. Give your health care provider a list of all the medicines, herbs, non-prescription drugs, or dietary supplements you use. Also tell them if you smoke, drink alcohol, or use illegal drugs. Some items may interact with your medicine. What should I watch for while using this medicine? Tell your doctor or healthcare professional if your symptoms do not start to get better or if they get worse. Your condition will be monitored carefully while you are receiving this medicine. This medicine can cause serious allergic reactions. Ask your doctor or health care professional about an epinephrine pen and/or other supportive care for certain severe allergic  reactions. This medicine is made from human plasma, and there is a small risk that it may contain certain types of viruses or bacteria. All products are processed to kill most viruses and bacteria. If you have questions concerning the risk of infections, discuss them with your doctor or health care professional. What side effects may I notice from receiving this medicine? Side effects that you should report to your doctor or health care professional as soon as possible:  allergic reactions like skin rash, itching or hives, swelling of the face, lips, or tongue  breathing problems  chest pain, tightness  feeling faint or lightheaded, falls  signs and symptoms of infection like fever or chills; cough; sore throat; pain or trouble passing urine  swelling in your legs or feet  unusually weak or tired Side effects that usually do not require medical attention (report to your doctor or health care professional if they continue or are bothersome):  diarrhea  dizziness  headache  hot flashes or flushing  joint or muscle pain  nausea  pain, redness, or irritation at site where injected  runny or stuffy nose  tiredness This list may not describe all possible side effects. Call your doctor for medical advice about side effects. You may report side effects to FDA at 1-800-FDA-1088. Where should I keep my medicine? Keep out of the reach of children. Unopened vials may be stored in a refrigerator between 2 and 8 degrees C (36 and 46 degrees F). Do not freeze. The vials may also be kept at room temperature, below 25 degrees C (77 degrees F) for up to 1 month. Do not shake. Do not re-refrigerate once the product has been stored at room temperature. Keep in the original carton until needed for use. Throw away any unused medicine after the expiration date on the label. NOTE: This sheet is a summary. It may not cover all possible information. If you have questions about this medicine, talk to  your doctor, pharmacist, or health care provider.  2020 Elsevier/Gold Standard (2016-07-25 19:23:01)

## 2019-12-17 NOTE — Assessment & Plan Note (Addendum)
-   Acute bronchitis exacerbation - RX doxycycline 1 tab twice daily x 7 days - Continue Trelegy 200 one puff daily in the morning (rinse mouth after use); Duoneb 2-3 times a day for breakthrough shortness of breath or wheezing  - Continue Mucinex 600-1200mg  twice daily with full glass of water  - Use Incentive spirometry and Flutter valve three times a day  - Pulmonary rehab exercises as directed

## 2019-12-17 NOTE — Assessment & Plan Note (Addendum)
-   Phenotype MZ, level 103 in December  - Pulmonary function testing in December 2020 showed moderate obstruction with reversibility and moderate diffusion defect  - Scheduled for PFTs, if decline in lung function will consider prolastin infusion (genetic component to emphysema)

## 2019-12-17 NOTE — Progress Notes (Signed)
_0  ID: Maria [name], female    DOB: January 18, 1956, 64 y.o.   MRN: [id number]  Chief Complaint  Patient presents with  . Follow-up    pt states tighness in chest coughing up yellow mucus    Referring provider: [name], MD  HPI:  64 year old female, former smoker quit in 2017 (0-pack-year history).  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  Patient of [name], seen for initial consult/post COVID follow-up on 03/19/2019.  Admitted on February 26, 2019 with several day history of increased weakness, shortness of breath and nausea/diarrhea.  He was diagnosed with COVID-19 and spent a total of 5 days at [facility].  Chest x-ray on admission showed some basal infiltrates.  Treated with oxygen, Solu-Medrol and Advair.  During follow-up visit she reports feeling a lot more fatigued and reports feeling short of breath all the time.  She is maintained on Trelegy for COPD.  Not currently on oxygen.  Labs, HRCT, ONO and echocardiogram ordered.  Trelegy was stopped and she was started on Pulmicort nebulizer twice daily and DuoNeb's 4 times a day and as needed.  Ordered for new nebulizer machine with aero care. Follow-up in 1 to 2 weeks after testing and needs flu shot during that visit.  Previous [facility] pulmonary encounters: 04/03/2019- FU with Volanda Napoleon, NP Presents today for a 2-week follow-up.  Echocardiogram showed normal systolic function with estimated EF 60 to 65%.  Left ventricle diastolic doppler consistent with impaired relaxation-determinant filling pressures.  Right ventricle with normal systolic function, increased wall thickness-systolic pressure could not be assessed.  HRCT is scheduled for 9/29.  Patient called on 9/4 asking about portable oxygen tank order.  Reports that her O2 level has read 84 to 85% after exertion with a heart rate of 110.  She  reports that her oxygen level recovers greater than 90% after resting.  Is not currently on oxygen at home will need to be walked in office to qualify for POC. Repeat COVID testing was positive, patient reports that she still has lost her taste and smell with increased shortness of breath.   She is feeling a little better today. Family is checking on her. No other help at home. Reports feeling fine when sitting but her O2 drops as low as 85% on exertion. She is able to recover with rest. Taking prednisone taper as prescribed, feels it has helped her chest tightness. She has dry/hacking cough with no mucus production. She is eating and drinking ok. Denies fever.   04/09/2019- FU with Volanda Napoleon, NP Patient presents today for 1 week follow-up. Unfortunately we were unable to get her set up with home health d/t her insurance. She is here today for qualifying O2 walk. She continues to have some shortness of breath and dry cough. Low energy. Her taste and smell as returned some. She is drinking plenty of fluids. Reports that her cough is not quite as bad, states the more she talks she has to cough. Has trouble sleeping at night d/t her respiratory symptoms. Feels delsym and tessalon perles have been helpful. ONO on RA showed O2 low 70% with baseline 87%. Ambulatory O2 today showed O2 desaturation 84% on RA, requiring 3L. Using aero care for nebulizer supplies. Denies fever, weight loss, hemoptysis, purulent mucus.   04/22/2019- FU with Volanda Napoleon, NP Patient presents today for 2 week follow-up. Breathing is the same. Continues to  have a dry cough. Completed prednisone 2 days ago. Using incentive spirometry. She has tried to go without her oxygen at home but states that she gets too short winded. 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 Diffuse bronchial wall thickening with moderate  centrilobular and paraseptal emphysema; imaging findings suggestive of underlying COPD. She had COPD symptoms before getting COVID. Her breathing was getting a little worse before her diagnosis.  Sleeping better at night with oxygen. Ambulating with rolling walker, still deconditioned/weak. Due for influenza vaccine today.   07/03/19- FU with [name]  Follow-up emphysema  Follow-up interstitial lung disease post COVID-19 in August 2020 (had elevatd d-dimer) Follow-up chronic hypoxemic respiratory failure secondary to both Maria [name] 64 y.o. -presents for the above issues.  She says her condition is returned to normal.  She is back working as a Forensic psychologist.  However she continues to be still significantly symptomatic.  She had a pulmonary function test June 24, 2019.  I visualized the result.  Shows isolated reduction in diffusion capacity was only moderate at 57%.  FVC and FEV1 are normal.  She continues to be significantly symptomatic as documented below.  She is taking Trelegy.  She says she desaturates to 85% with exertion echocardiogram recently was normal.  She is asking if she should take the Moderna COVID-19 vaccine available in January 2021  08/22/19- FU with [name] Follow-up emphysema - alpha 1 MZ in dec 2020 Follow-up interstitial lung disease post COVID-19 in August 2020 Follow-up chronic hypoxemic respiratory failure secondary to both Ex smoker Echo sept 202 0 - ef 65% and diast dysfn Maria [name] 64 y.o. -presents for follow-up of the above issues.  In the interim she did have serologic work-up for her ILD that was discovered post Covid setting.  Her ANA and rheumatoid factor were trace positive.  Her ESR was 41.  Therefore we committed her to few to several week prednisone taper.  She says she is on a 5-week taper.  She is got 2 more weeks of this.  She says since her last visit she is feeling worse.  The worsening is in terms of shortness of breath.  Also  has dizziness and floaters when she exerts.  In fact on a virtual rehab class she had to get on more oxygen.  Her dyspnea symptom scores are worse than before but cough and fatigue around the same.  She is requiring 3 L now with exertion.  Although in rehab the asked her to increase it to 6 L which helped but this was not documented because of worsening hypoxemia.  Today when we walked her she had to stop several times because of dizziness and fatigue although she did not desaturate.  Her lung function itself in December 2020 shows only isolated reduction in DLCO.  09/16/2019 Patient presents today for 2 week follow-up visit to discuss recent testing results. She is at baseline. No acute complaints.  She is attending pulmonary rehab. Ran out of Trelegy inhaler three days ago, needs refill. Using 2L oxygen at rest and 4-5L on exertion. HRCT showed no evidence of ILD, nonspecific postinfectious/inflammatory scarring right lung base. Echocardiogram was normal. Bilateral lower extremity ultrasound negative for DVT. Alpha-1 showed MZ phenotype. Her father had emphysema. Reviewed all testing with patient at length and answered any questions. Plan repeat PFTs annually, if decline in lung function  may qualify for infusion for alpha-1 deficiency.    11/18/2019  Patient presents today with reports of increased shortness of breath x 1 week. Prior to that her breathing was mostly baseline. She has some days better than others. Associated chest tightness and dry cough. Experiences tightness across her chest which is worse when lying down. She feels better when sleeping in recliner. Reports constant postnasal drip.Sill doing pulmonary rehab over the phone and during exercises at home. Weight fluctuates.   11/26/2019 Patient contacted today for 2 week follow-up. Her breathing is the same. Reports that her cough is a little worse. She is only taking mucinex in the evening. She did not notice an improvement with prednisone  taper. She was previously using two puffs of 100 Trelegy, so no change with new sample of Trelegy 200. She has a flutter valve, uses this in the morning and in the evening twice. She is still attending pulmonary rehab. Ordered for PFTs.    12/17/2019 Patient presents today for 1 month follow-up for COPD/emphysema alpha 1 MZ and post-Covid August 2020. She was seen last month for a video visit. We have since stopped Singulair d/t bad dreams. She continues to have chest tightness and reports productive cough with yellow mucus. Her cough has worsened a little over the last 2-3 weeks. CT chest in February showed no evidence of interstitial lung disease, chronic nonspecific postinfectious/postinflammatory scarring right lung base. Moderate to severe centrilobular and paraseptal emphysema with bronchial wall thickening suggesting COPD. She does have atherosclerosis as well. LDL in March was 122, total cholesterol was 193. She takes low dose Lipitor.   She is currently on 3L pulse at rest and 5L on exertion. Continues participating in pulmonary rehab exercises at her work gym and gets in touch with them daily. She has a pulse oximeter at home which she checks, her levels run between 94-95%.   She has been on prednisone previously but did not notice any significant improvement with steroid.  She uses Ventolin inhaler several times a day and duoneb twice a day. She reports short term improvement only. Denies fever, chills. chest pain, GERD symptoms, N/V/D. PFTs have been scheduled.  SYMPTOM SCALE - ILD 07/03/2019  08/22/2019  12/17/2019   O2 use 2L witg exertion 2L rest, 3L sexertion 3L pulsed at rest; 5L on exertion  Shortness of Breath 0 -> 5 scale with 5 being worst (score 6 If unable to do) 0 -> 5 scale with 5 being worst (score 6 If unable to do) 0 -> 5 scale with 5 being worst (score 6 If unable to do)  At rest 2 3 2.5  Simple tasks - showers, clothes change, eating, shaving _0 Household (dishes,  doing bed, laundry) 4._1 Shopping _2 Walking level at own pace 5 4 4.5  Walking up Stairs _3 Total (40 - 48) Dyspnea Score 23._4 How bad is your cough? moderte 3 3.5  How bad is your fatigue moderate 3 4.5    Allergies  Allergen Reactions  . Montelukast     Bad dreams    Immunization History  Administered Date(s) Administered  . Influenza, Seasonal, Injecte, Preservative Fre 04/28/2008, 04/09/2010  . Influenza,inj,Quad PF,6+ Mos 04/22/2019  . Influenza-Unspecified 06/01/2008, 05/10/2016, 03/18/2017  . Moderna SARS-COVID-2 Vaccination 07/30/2019, 08/28/2019  . Pneumococcal Polysaccharide-23 04/15/2015  . Tdap 11/17/2009    Past Medical History:  Diagnosis Date  . Atherosclerosis 1/0612014   aorta,  iliacs and CFA bilaterally no greater than 0-49% - lower arterial doppler.  . Blood transfusion without reported diagnosis 2005  . Cancer ([place]) Ovarian 1978  . Chest pain   . COPD (chronic obstructive pulmonary disease) ([place])   . Emphysema of lung ([place]) 2019  . Oxygen deficiency Sept 2020    Tobacco History: Social History   Tobacco Use  Smoking Status Former Smoker  . Packs/day: 1.00  . Years: 45.00  . Pack years: 45.00  . Types: Cigarettes  . Quit date: 03/18/2016  . Years since quitting: 3.7  Smokeless Tobacco Never Used   Counseling given: Not Answered   Outpatient Medications Prior to Visit  Medication Sig Dispense Refill  . aspirin EC 81 MG tablet Take 1 tablet (81 mg total) by mouth daily. 90 tablet 3  . atorvastatin (LIPITOR) 10 MG tablet Take 1 tablet (10 mg total) by mouth daily. 90 tablet 3  . azelastine (ASTELIN) 0.1 % nasal spray Place 1 spray into both nostrils 2 (two) times daily. Use in each nostril as directed 30 mL 1  . Fluticasone-Umeclidin-Vilant (TRELEGY ELLIPTA) 200-62.5-25 MCG/INH AEPB Inhale 1 puff into the lungs daily. 28 each 0  . ipratropium-albuterol (DUONEB) 0.5-2.5 (3) MG/3ML SOLN USE 1 AMPULE IN NEBULIZER 4 TIMES A DAY  AS NEEDED    (Dx: J84.112, J44.9) 360 mL 5  . nortriptyline (PAMELOR) 25 MG capsule Take 25 mg by mouth at bedtime.    . OXYGEN Inhale into the lungs. 2 liters at rest and 5 liters with any activity.-    . Respiratory Therapy Supplies (FLUTTER) DEVI Use as directed 1 each 0  . VENTOLIN HFA 108 (90 Base) MCG/ACT inhaler Inhale 2 puffs into the lungs every 6 (six) hours as needed for shortness of breath.    . montelukast (SINGULAIR) 10 MG tablet Take 1 tablet (10 mg total) by mouth at bedtime. 30 tablet 6   No facility-administered medications prior to visit.      Review of Systems  Review of Systems  Constitutional: Positive for fatigue.  Respiratory: Positive for cough, chest tightness and shortness of breath.   Cardiovascular: Negative.     Physical Exam  BP 138/84 (BP Location: Left Arm, Cuff Size: Normal)   Pulse 78   Temp 97.9 F (36.6 C) (Oral)   Ht _0  (1.6 m)   Wt 185 lb 3.2 oz (84 kg)   SpO2 99%   BMI 32.81 kg/m  Physical Exam Constitutional:      Appearance: Normal appearance. She is not ill-appearing.  Cardiovascular:     Rate and Rhythm: Normal rate and regular rhythm.  Pulmonary:     Breath sounds: No wheezing.     Comments: Diminished Neurological:     Mental Status: She is alert.  Psychiatric:        Mood and Affect: Mood normal.        Behavior: Behavior normal.        Thought Content: Thought content normal.        Judgment: Judgment normal.      Lab Results:  CBC    Component Value Date/Time   WBC 11.8 (H) 11/18/2019 1326   RBC 3.86 (L) 11/18/2019 1326   HGB 12.1 11/18/2019 1326   HGB 13.6 08/22/2019 1359   HCT 36.2 11/18/2019 1326   HCT 39.3 08/22/2019 1359   PLT 361.0 11/18/2019 1326   PLT 381 08/22/2019 1359   MCV 93.9 11/18/2019 1326   MCV 91 08/22/2019 1359  MCH 31.6 08/22/2019 1359   MCH 31.0 03/03/2019 0154   MCHC 33.5 11/18/2019 1326   RDW 13.4 11/18/2019 1326   RDW 12.5 08/22/2019 1359   LYMPHSABS 3.1 11/18/2019 1326    LYMPHSABS 2.5 08/22/2019 1359   MONOABS 0.6 11/18/2019 1326   EOSABS 0.2 11/18/2019 1326   EOSABS 0.0 08/22/2019 1359   BASOSABS 0.0 11/18/2019 1326   BASOSABS 0.0 08/22/2019 1359    BMET    Component Value Date/Time   NA 140 08/22/2019 1359   K 4.3 08/22/2019 1359   CL 103 08/22/2019 1359   CO2 28 08/22/2019 1359   GLUCOSE 116 (H) 08/22/2019 1359   BUN 13 08/22/2019 1359   CREATININE 0.73 08/22/2019 1359   CALCIUM 9.7 08/22/2019 1359   GFRNONAA >60 03/03/2019 0154   GFRAA >60 03/03/2019 0154    BNP No results found for: BNP  ProBNP    Component Value Date/Time   PROBNP 53.0 11/18/2019 1326    Imaging: No results found.   Assessment & Plan:   Emphysema due to alpha-1-antitrypsin deficiency ([place]) - Phenotype MZ, level 103 in December  - Pulmonary function testing in December 2020 showed moderate obstruction with reversibility and moderate diffusion defect  - Scheduled for PFTs, if decline in lung function will consider prolastin infusion (genetic component to emphysema)  Stage 2 moderate COPD by GOLD classification (HCC) - Acute bronchitis exacerbation - RX doxycycline 1 tab twice daily x 7 days - Continue Trelegy 200 one puff daily in the morning (rinse mouth after use); Duoneb 2-3 times a day for breakthrough shortness of breath or wheezing  - Continue Mucinex 600-1211m twice daily with full glass of water  - Use Incentive spirometry and Flutter valve three times a day  - Pulmonary rehab exercises as directed   [place] respiratory failure with hypoxia [place] - Patient gets fatigued extremely easily  - Ambulatory O2 stayed above 90% on 3L pulsed today    EMartyn Ehrich NP 12/17/2019

## 2019-12-17 NOTE — Assessment & Plan Note (Addendum)
-   Patient gets fatigued extremely easily  - Ambulatory O2 stayed above 90% on 3L pulsed today

## 2019-12-22 DIAGNOSIS — J449 Chronic obstructive pulmonary disease, unspecified: Secondary | ICD-10-CM | POA: Diagnosis not present

## 2020-01-07 DIAGNOSIS — J449 Chronic obstructive pulmonary disease, unspecified: Secondary | ICD-10-CM | POA: Diagnosis not present

## 2020-01-13 ENCOUNTER — Other Ambulatory Visit (HOSPITAL_COMMUNITY)
Admission: RE | Admit: 2020-01-13 | Discharge: 2020-01-13 | Disposition: A | Discharge status: Home / Self Care | Payer: BC Managed Care – PPO | Source: Ambulatory Visit | Attending: Family Medicine | Admitting: Family Medicine

## 2020-01-13 DIAGNOSIS — Z01812 Encounter for preprocedural laboratory examination: Secondary | ICD-10-CM | POA: Diagnosis not present

## 2020-01-13 DIAGNOSIS — Z20822 Contact with and (suspected) exposure to covid-19: Secondary | ICD-10-CM | POA: Insufficient documentation

## 2020-01-13 LAB — SARS CORONAVIRUS 2 (TAT 6-24 HRS): SARS Coronavirus 2: NEGATIVE

## 2020-01-16 ENCOUNTER — Ambulatory Visit (INDEPENDENT_AMBULATORY_CARE_PROVIDER_SITE_OTHER): Payer: BC Managed Care – PPO | Admitting: Internal Medicine

## 2020-01-16 ENCOUNTER — Other Ambulatory Visit: Payer: Self-pay

## 2020-01-16 DIAGNOSIS — R0602 Shortness of breath: Secondary | ICD-10-CM

## 2020-01-16 DIAGNOSIS — J849 Interstitial pulmonary disease, unspecified: Secondary | ICD-10-CM

## 2020-01-16 DIAGNOSIS — R0609 Other forms of dyspnea: Secondary | ICD-10-CM

## 2020-01-16 DIAGNOSIS — R0600 Dyspnea, unspecified: Secondary | ICD-10-CM

## 2020-01-16 LAB — PULMONARY FUNCTION TEST
DL/VA % pred: 61 %
DL/VA: 2.53 ml/min/mmHg/L
DLCO cor % pred: 52 %
DLCO cor: 11.29 ml/min/mmHg
DLCO unc % pred: 50 %
DLCO unc: 10.81 ml/min/mmHg
FEF 25-75 Post: 0.75 L/sec
FEF 25-75 Pre: 0.61 L/sec
FEF2575-%Change-Post: 22 %
FEF2575-%Pred-Post: 31 %
FEF2575-%Pred-Pre: 25 %
FEV1-%Change-Post: 4 %
FEV1-%Pred-Post: 67 %
FEV1-%Pred-Pre: 65 %
FEV1-Post: 1.83 L
FEV1-Pre: 1.75 L
FEV1FVC-%Change-Post: 2 %
FEV1FVC-%Pred-Pre: 71 %
FEV6-%Change-Post: 2 %
FEV6-%Pred-Post: 89 %
FEV6-%Pred-Pre: 87 %
FEV6-Post: 3.03 L
FEV6-Pre: 2.96 L
FEV6FVC-%Change-Post: 0 %
FEV6FVC-%Pred-Post: 96 %
FEV6FVC-%Pred-Pre: 96 %
FVC-%Change-Post: 2 %
FVC-%Pred-Post: 92 %
FVC-%Pred-Pre: 90 %
FVC-Post: 3.25 L
FVC-Pre: 3.17 L
Post FEV1/FVC ratio: 56 %
Post FEV6/FVC ratio: 93 %
Pre FEV1/FVC ratio: 55 %
Pre FEV6/FVC Ratio: 93 %
RV % pred: 86 %
RV: 1.84 L
TLC % pred: 98 %
TLC: 5.25 L

## 2020-01-16 NOTE — Progress Notes (Signed)
PFT done today. 

## 2020-01-21 DIAGNOSIS — J449 Chronic obstructive pulmonary disease, unspecified: Secondary | ICD-10-CM | POA: Diagnosis not present

## 2020-01-24 ENCOUNTER — Other Ambulatory Visit: Payer: Self-pay | Admitting: Primary Care

## 2020-01-24 NOTE — Telephone Encounter (Signed)
Refill request for azelastine (ASTELIN) 0.1 % nasal spray  Last OV: 12/17/2019 Next OV: 02/05/2020  Last ordered by : Derl Barrow Quantity:  Instructions:   Looked at last 2-3 office visit notes which does not list if patient to continue or not please advise  Allergies  Allergen Reactions  . Montelukast     Bad dreams    Current Outpatient Medications on File Prior to Visit  Medication Sig Dispense Refill  . aspirin EC 81 MG tablet Take 1 tablet (81 mg total) by mouth daily. 90 tablet 3  . atorvastatin (LIPITOR) 10 MG tablet Take 1 tablet (10 mg total) by mouth daily. 90 tablet 3  . azelastine (ASTELIN) 0.1 % nasal spray Place 1 spray into both nostrils 2 (two) times daily. Use in each nostril as directed 30 mL 1  . Fluticasone-Umeclidin-Vilant (TRELEGY ELLIPTA) 200-62.5-25 MCG/INH AEPB Inhale 1 puff into the lungs daily. 28 each 0  . ipratropium-albuterol (DUONEB) 0.5-2.5 (3) MG/3ML SOLN USE 1 AMPULE IN NEBULIZER 4 TIMES A DAY AS NEEDED    (Dx: J84.112, J44.9) 360 mL 5  . nortriptyline (PAMELOR) 25 MG capsule Take 25 mg by mouth at bedtime.    . OXYGEN Inhale into the lungs. 2 liters at rest and 5 liters with any activity.-    . Respiratory Therapy Supplies (FLUTTER) DEVI Use as directed 1 each 0  . VENTOLIN HFA 108 (90 Base) MCG/ACT inhaler Inhale 2 puffs into the lungs every 6 (six) hours as needed for shortness of breath.     No current facility-administered medications on file prior to visit.

## 2020-01-28 ENCOUNTER — Other Ambulatory Visit: Payer: Self-pay | Admitting: Primary Care

## 2020-02-05 ENCOUNTER — Other Ambulatory Visit: Payer: Self-pay

## 2020-02-05 ENCOUNTER — Encounter: Payer: Self-pay | Admitting: Primary Care

## 2020-02-05 ENCOUNTER — Ambulatory Visit: Payer: BC Managed Care – PPO | Admitting: Primary Care

## 2020-02-05 VITALS — BP 142/70 | HR 79 | Temp 98.1°F | Ht 65.5 in | Wt 187.4 lb

## 2020-02-05 DIAGNOSIS — J449 Chronic obstructive pulmonary disease, unspecified: Secondary | ICD-10-CM | POA: Diagnosis not present

## 2020-02-05 DIAGNOSIS — J438 Other emphysema: Secondary | ICD-10-CM

## 2020-02-05 DIAGNOSIS — J1282 Pneumonia due to coronavirus disease 2019: Secondary | ICD-10-CM

## 2020-02-05 DIAGNOSIS — E8801 Alpha-1-antitrypsin deficiency: Secondary | ICD-10-CM | POA: Diagnosis not present

## 2020-02-05 DIAGNOSIS — U071 COVID-19: Secondary | ICD-10-CM

## 2020-02-05 MED ORDER — TRELEGY ELLIPTA 200-62.5-25 MCG/INH IN AEPB: 1.0000 | INHALATION_SPRAY | Freq: Every day | RESPIRATORY_TRACT | 5 refills | Status: DC

## 2020-02-05 MED ORDER — PANTOPRAZOLE SODIUM 20 MG PO TBEC: 20.0000 mg | DELAYED_RELEASE_TABLET | Freq: Two times a day (BID) | ORAL | 3 refills | Status: DC

## 2020-02-05 NOTE — Assessment & Plan Note (Signed)
-   HRCT in February 2021 showed no compelling evidence of ILD, chronic parenchymal band dependent right lung base compatible with nonspecific postinfectious/inflammatory scarring - ILD symptom score today was 30 (from 27)

## 2020-02-05 NOTE — Assessment & Plan Note (Signed)
-   Pulmonary function testing showed 6% decline in FEV1.  - Recheck Alpha-1 level today, consideration to start patient on Alpha-1 proteinase inhibitor such as Prolastin

## 2020-02-05 NOTE — Progress Notes (Signed)
_0  ID: Maria [name], female    DOB: 12-Mar-1956, 64 y.o.   MRN: [id number]  Chief Complaint  Patient presents with  . Follow-up    Referring provider: [name], MD  HPI: 64 year old female, former smoker quit in 2017 (0-pack-year history).  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   Patient of [name], seen for initial consult/post COVID follow-up on 03/19/2019.  Admitted on February 26, 2019 with several day history of increased weakness, shortness of breath and nausea/diarrhea.  He was diagnosed with COVID-19 and spent a total of 5 days at [facility]  Chest x-ray on admission showed some basal infiltrates.  Treated with oxygen, Solu-Medrol and Advair.  During follow-up visit she reports feeling a lot more fatigued and reports feeling short of breath all the time.  She is maintained on Trelegy for COPD.  Not currently on oxygen.  Labs, HRCT, ONO and echocardiogram ordered.  Trelegy was stopped and she was started on Pulmicort nebulizer twice daily and DuoNeb's 4 times a day and as needed.  Ordered for new nebulizer machine with aero care. Follow-up in 1 to 2 weeks after testing and needs flu shot during that visit.  Previous [facility] pulmonary encounters: 04/03/2019- FU with Volanda Napoleon, NP Presents today for a 2-week follow-up.  Echocardiogram showed normal systolic function with estimated EF 60 to 65%.  Left ventricle diastolic doppler consistent with impaired relaxation-determinant filling pressures.  Right ventricle with normal systolic function, increased wall thickness-systolic pressure could not be assessed.  HRCT is scheduled for 9/29.  Patient called on 9/4 asking about portable oxygen tank order.  Reports that her O2 level has read 84 to 85% after exertion with a heart rate of 110.  She reports that her oxygen level recovers greater than 90%  after resting.  Is not currently on oxygen at home will need to be walked in office to qualify for POC. Repeat COVID testing was positive, patient reports that she still has lost her taste and smell with increased shortness of breath.   She is feeling a little better today. Family is checking on her. No other help at home. Reports feeling fine when sitting but her O2 drops as low as 85% on exertion. She is able to recover with rest. Taking prednisone taper as prescribed, feels it has helped her chest tightness. She has dry/hacking cough with no mucus production. She is eating and drinking ok. Denies fever.   04/09/2019- FU with Volanda Napoleon, NP Patient presents today for 1 week follow-up. Unfortunately we were unable to get her set up with home health d/t her insurance. She is here today for qualifying O2 walk. She continues to have some shortness of breath and dry cough. Low energy. Her taste and smell as returned some. She is drinking plenty of fluids. Reports that her cough is not quite as bad, states the more she talks she has to cough. Has trouble sleeping at night d/t her respiratory symptoms. Feels delsym and tessalon perles have been helpful. ONO on RA showed O2 low 70% with baseline 87%. Ambulatory O2 today showed O2 desaturation 84% on RA, requiring 3L. Using [facility] care for nebulizer supplies. Denies fever, weight loss, hemoptysis, purulent mucus.   04/22/2019- FU with Volanda Napoleon, NP Patient presents today for 2 week follow-up. Breathing is the same. Continues to have a dry cough. Completed prednisone 2 days ago. Using incentive spirometry.  She has tried to go without her oxygen at home but states that she gets too short winded. 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 Diffuse bronchial wall thickening with moderate centrilobular and paraseptal emphysema; imaging findings suggestive  of underlying COPD. She had COPD symptoms before getting COVID. Her breathing was getting a little worse before her diagnosis.  Sleeping better at night with oxygen. Ambulating with rolling walker, still deconditioned/weak. Due for influenza vaccine today.   07/03/19- FU with [name]  Follow-up emphysema  Follow-up interstitial lung disease post COVID-19 in August 2020 (had elevatd d-dimer) Follow-up chronic hypoxemic respiratory failure secondary to both [name] 64 y.o. -presents for the above issues.  She says her condition is returned to normal.  She is back working as a Forensic psychologist.  However she continues to be still significantly symptomatic.  She had a pulmonary function test June 24, 2019.  I visualized the result.  Shows isolated reduction in diffusion capacity was only moderate at 57%.  FVC and FEV1 are normal.  She continues to be significantly symptomatic as documented below.  She is taking Trelegy.  She says she desaturates to 85% with exertion echocardiogram recently was normal.  She is asking if she should take the Moderna COVID-19 vaccine available in January 2021  08/22/19- FU with [name] Follow-up emphysema - alpha 1 MZ in dec 2020 Follow-up interstitial lung disease post COVID-19 in August 2020 Follow-up chronic hypoxemic respiratory failure secondary to both Ex smoker Echo sept 202 0 - ef 65% and diast dysfn Maria [name] 64 y.o. -presents for follow-up of the above issues.  In the interim she did have serologic work-up for her ILD that was discovered post Covid setting.  Her ANA and rheumatoid factor were trace positive.  Her ESR was 41.  Therefore we committed her to few to several week prednisone taper.  She says she is on a 5-week taper.  She is got 2 more weeks of this.  She says since her last visit she is feeling worse.  The worsening is in terms of shortness of breath.  Also has dizziness and floaters when she exerts.  In fact on a virtual  rehab class she had to get on more oxygen.  Her dyspnea symptom scores are worse than before but cough and fatigue around the same.  She is requiring 3 L now with exertion.  Although in rehab the asked her to increase it to 6 L which helped but this was not documented because of worsening hypoxemia.  Today when we walked her she had to stop several times because of dizziness and fatigue although she did not desaturate.  Her lung function itself in December 2020 shows only isolated reduction in DLCO.  09/16/2019 Patient presents today for 2 week follow-up visit to discuss recent testing results. She is at baseline. No acute complaints.  She is attending pulmonary rehab. Ran out of Trelegy inhaler three days ago, needs refill. Using 2L oxygen at rest and 4-5L on exertion. HRCT showed no evidence of ILD, nonspecific postinfectious/inflammatory scarring right lung base. Echocardiogram was normal. Bilateral lower extremity ultrasound negative for DVT. Alpha-1 showed MZ phenotype. Her father had emphysema. Reviewed all testing with patient at length and answered any questions. Plan repeat PFTs annually, if decline in lung function may qualify for infusion for alpha-1 deficiency.    11/18/2019  Patient presents today with reports of increased shortness of breath x 1 week. Prior to that her breathing was mostly baseline. She has some days better than others. Associated chest tightness and dry cough. Experiences tightness across her chest which is worse when lying down. She feels better when sleeping in recliner. Reports constant postnasal drip.Sill doing pulmonary rehab over the phone and during exercises at home. Weight fluctuates.   11/26/2019 Patient contacted today for 2 week follow-up. Her breathing is the same. Reports that her cough is a little worse. She is only taking mucinex in the evening. She did not notice an improvement with prednisone taper. She was previously using two puffs of 100 Trelegy, so no change  with new sample of Trelegy 200. She has a flutter valve, uses this in the morning and in the evening twice. She is still attending pulmonary rehab. Ordered for PFTs.    12/17/2019 Patient presents today for 1 month follow-up for COPD/emphysema alpha 1 MZ and post-Covid August 2020. She was seen last month for a video visit. We have since stopped Singulair d/t bad dreams. She continues to have chest tightness and reports productive cough with yellow mucus. Her cough has worsened a little over the last 2-3 weeks. CT chest in February showed no evidence of interstitial lung disease, chronic nonspecific postinfectious/postinflammatory scarring right lung base. Moderate to severe centrilobular and paraseptal emphysema with bronchial wall thickening suggesting COPD. She does have atherosclerosis as well. LDL in March was 122, total cholesterol was 193. She takes low dose Lipitor.    She is currently on 3L pulse at rest and 5L on exertion. Continues participating in pulmonary rehab exercises at her work gym and gets in touch with them daily. She has a pulse oximeter at home which she checks, her levels run between 94-95%.   She has been on prednisone previously but did not notice any significant improvement with steroid.  She uses Ventolin inhaler several times a day and duoneb twice a day. She reports short term improvement only. Denies fever, chills. chest pain, GERD symptoms, N/V/D. PFTs have been scheduled.   02/05/2020 - interim hx Patient presents today for 6 week follow-up COPD/emphysema alpha-1 MZ and post covid August 2020.  She has noticed a decline in her breathing. She gets dyspnea easily on exertion. 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 She is current under disciplinary action for being "too slow". She would like prescription to change Trelegy 200 for insurance reasons. She had been taking two puffs of Trelegy 100 Elipta inhaler.  ILD symptom score is increased to 30 (27). Pulmonary  function testing showed 6% decline in FEV1. Consideration to start patient on Alpha-1 replacement.     SYMPTOM SCALE - ILD 07/03/2019  08/22/2019  12/17/2019  02/05/2020   O2 use 2L witg exertion 2L rest, 3L sexertion 3L pulsed at rest; 5L on exertion 3L pulsed; 5-6L on exertoin  Shortness of Breath 0 -> 5 scale with 5 being worst (score 6 If unable to do) 0 -> 5 scale with 5 being worst (score 6 If unable to do) 0 -> 5 scale with 5 being worst (score 6 If unable to do) 0 -> 5 scale with 5 being worst (score 6 If unable to do)  At rest 2 3 2.5 4  Simple tasks - showers, clothes change, eating, shaving _0 Household (dishes, doing bed, laundry) 4._1 Shopping _2 Walking level at own  pace 5 4 4.5 5  Walking up Stairs _0 Total (40 - 48) Dyspnea Score 23._1 How bad is your cough? moderte 3 3.5 4  How bad is your fatigue moderate 3 4.5 5          Pulmonary function testing: 02/05/2020 - FVC 3.25 (92%), FEV1 1.83 (67%), ratio 56, TLC 98%, dlcocor 11.29 (52%)  06/23/20- FVC 3.39 (96%), FEV1 1.99 (73%), ratio 59, TLC 108%, DLCOunc 12.24 (57%)  07/03/19 Alpha -1 phenotype MZ 103  Allergies  Allergen Reactions  . Montelukast     Bad dreams    Immunization History  Administered Date(s) Administered  . Influenza, Seasonal, Injecte, Preservative Fre 04/28/2008, 04/09/2010  . Influenza,inj,Quad PF,6+ Mos 04/22/2019  . Influenza-Unspecified 06/01/2008, 05/10/2016, 03/18/2017  . Moderna SARS-COVID-2 Vaccination 07/30/2019, 08/28/2019  . Pneumococcal Polysaccharide-23 04/15/2015  . Tdap 11/17/2009    Past Medical History:  Diagnosis Date  . Atherosclerosis 1/0612014   aorta, iliacs and CFA bilaterally no greater than 0-49% - lower arterial doppler.  . Blood transfusion without reported diagnosis 2005  . Cancer ([place]) Ovarian 1978  . Chest pain   . COPD (chronic obstructive pulmonary disease) ([place])   . Emphysema of lung ([place]) 2019  . Oxygen deficiency  Sept 2020    Tobacco History: Social History   Tobacco Use  Smoking Status Former Smoker  . Packs/day: 1.00  . Years: 45.00  . Pack years: 45.00  . Types: Cigarettes  . Quit date: 03/18/2016  . Years since quitting: 3.8  Smokeless Tobacco Never Used   Counseling given: Not Answered   Outpatient Medications Prior to Visit  Medication Sig Dispense Refill  . aspirin EC 81 MG tablet Take 1 tablet (81 mg total) by mouth daily. 90 tablet 3  . atorvastatin (LIPITOR) 10 MG tablet Take 1 tablet (10 mg total) by mouth daily. 90 tablet 3  . azelastine (ASTELIN) 0.1 % nasal spray PLACE 1 SPRAY INTO BOTH NOSTRILS 2 (TWO) TIMES DAILY. USE IN EACH NOSTRIL AS DIRECTED 30 mL 1  . ipratropium-albuterol (DUONEB) 0.5-2.5 (3) MG/3ML SOLN USE 1 AMPULE IN NEBULIZER 4 TIMES A DAY AS NEEDED    (Dx: J84.112, J44.9) 360 mL 5  . OXYGEN Inhale into the lungs. 2 liters at rest and 5 liters with any activity.-    . Respiratory Therapy Supplies (FLUTTER) DEVI Use as directed 1 each 0  . VENTOLIN HFA 108 (90 Base) MCG/ACT inhaler Inhale 2 puffs into the lungs every 6 (six) hours as needed for shortness of breath.    . Fluticasone-Umeclidin-Vilant (TRELEGY ELLIPTA) 200-62.5-25 MCG/INH AEPB Inhale 1 puff into the lungs daily. 28 each 0  . TRELEGY ELLIPTA 100-62.5-25 MCG/INH AEPB TAKE 2 PUFFS BY MOUTH DAILY BEFORE BREAKFAST. 360 each 2  . nortriptyline (PAMELOR) 25 MG capsule Take 25 mg by mouth at bedtime.     No facility-administered medications prior to visit.    Review of Systems  Review of Systems  HENT: Negative.   Respiratory: Positive for shortness of breath.   Cardiovascular: Negative.     Physical Exam  BP (!) 142/70 (BP Location: Left Arm, Cuff Size: Normal)   Pulse 79   Temp 98.1 F (36.7 C) (Oral)   Ht 5' 5.5" (1.664 m)   Wt 187 lb 6.4 oz (85 kg)   SpO2 99%   BMI 30.71 kg/m  Physical Exam Constitutional:      Appearance: Normal appearance.  HENT:     Head:  Normocephalic and  atraumatic.     Mouth/Throat:     Mouth: Mucous membranes are moist.     Pharynx: Oropharynx is clear.  Cardiovascular:     Rate and Rhythm: Normal rate and regular rhythm.  Pulmonary:     Comments: Diminished, clear Skin:    General: Skin is warm and dry.  Neurological:     General: No focal deficit present.     Mental Status: She is alert and oriented to person, place, and time. Mental status is at baseline.  Psychiatric:        Mood and Affect: Mood normal.        Behavior: Behavior normal.        Thought Content: Thought content normal.        Judgment: Judgment normal.      Lab Results:  CBC    Component Value Date/Time   WBC 11.8 (H) 11/18/2019 1326   RBC 3.86 (L) 11/18/2019 1326   HGB 12.1 11/18/2019 1326   HGB 13.6 08/22/2019 1359   HCT 36.2 11/18/2019 1326   HCT 39.3 08/22/2019 1359   PLT 361.0 11/18/2019 1326   PLT 381 08/22/2019 1359   MCV 93.9 11/18/2019 1326   MCV 91 08/22/2019 1359   MCH 31.6 08/22/2019 1359   MCH 31.0 03/03/2019 0154   MCHC 33.5 11/18/2019 1326   RDW 13.4 11/18/2019 1326   RDW 12.5 08/22/2019 1359   LYMPHSABS 3.1 11/18/2019 1326   LYMPHSABS 2.5 08/22/2019 1359   MONOABS 0.6 11/18/2019 1326   EOSABS 0.2 11/18/2019 1326   EOSABS 0.0 08/22/2019 1359   BASOSABS 0.0 11/18/2019 1326   BASOSABS 0.0 08/22/2019 1359    BMET    Component Value Date/Time   NA 140 08/22/2019 1359   K 4.3 08/22/2019 1359   CL 103 08/22/2019 1359   CO2 28 08/22/2019 1359   GLUCOSE 116 (H) 08/22/2019 1359   BUN 13 08/22/2019 1359   CREATININE 0.73 08/22/2019 1359   CALCIUM 9.7 08/22/2019 1359   GFRNONAA >60 03/03/2019 0154   GFRAA >60 03/03/2019 0154    BNP No results found for: BNP  ProBNP    Component Value Date/Time   PROBNP 53.0 11/18/2019 1326    Imaging: No results found.   Assessment & Plan:   Emphysema due to alpha-1-antitrypsin deficiency ([place]) - Pulmonary function testing showed 6% decline in FEV1.  - Recheck Alpha-1 level  today, consideration to start patient on Alpha-1 proteinase inhibitor such as Prolastin   Pneumonia due to COVID-19 virus - HRCT in February 2021 showed no compelling evidence of ILD, chronic parenchymal band dependent right lung base compatible with nonspecific postinfectious/inflammatory scarring - ILD symptom score today was 30 (from 55)      Martyn Ehrich, NP 02/05/2020

## 2020-02-05 NOTE — Patient Instructions (Addendum)
Pulmonary function showed decrease in lung function by 6%   Orders: Needs Alpha-1 testing * We will consider starting you on Alpha-1 proteinase inhibitor injection/infusion   Follow-up: 6 weeks with Beth or MR- telephone visit is ok      Alpha-1-proteinase Inhibitor Injection What is this medicine? ALPHA-1-PROTEINASE INHIBITOR (AL fa - 1 PRO tee nase in HIB i tor) is a drug that is used to replace an enzyme in patients with lung problems caused by low levels of alpha-1 antitrypsin (ATA). It is not a cure. This medicine may be used for other purposes; ask your health care provider or pharmacist if you have questions. COMMON BRAND NAME(S): Aralast, Aralast NP, Merita Norton, Prolastin, Prolastin C, Zemaira What should I tell my health care provider before I take this medicine? They need to know if you have any of these conditions:  IgA (immunoglobulin A) deficiency  an unusual or allergic reaction to alpha-1-proteinase inhibitor, other medicines, foods, dyes, or preservatives  pregnant or trying to get pregnant  breast-feeding How should I use this medicine? This medicine is for infusion into a vein. Your healthcare professional will decide if infusion in your home is right for you. You should be trained on how to do infusions by your healthcare professional. Follow the directions on the prescription label. Do not use your medicine more often than directed. It is important that you put your used needles and supplies in a special sharps container. Do not put them in a trash can. If you do not have a sharps container, call your pharmacist or healthcare provider to get one. Talk to your pediatrician regarding the use of this medicine in children. Special care may be needed. Overdosage: If you think you have taken too much of this medicine contact a poison control center or emergency room at once. NOTE: This medicine is only for you. Do not share this medicine with others. What if I miss a  dose? This medicine is used once a week. It is important not to miss your dose. Call your health care professional if you are unable to keep an appointment. If you use this medicine at home and you miss a dose, use it as soon as you can and get back to a weekly schedule. Call your healthcare professional if you are not sure what to do about a missed dose. What may interact with this medicine? Interactions are not expected. This list may not describe all possible interactions. Give your health care provider a list of all the medicines, herbs, non-prescription drugs, or dietary supplements you use. Also tell them if you smoke, drink alcohol, or use illegal drugs. Some items may interact with your medicine. What should I watch for while using this medicine? Tell your doctor or healthcare professional if your symptoms do not start to get better or if they get worse. Your condition will be monitored carefully while you are receiving this medicine. This medicine can cause serious allergic reactions. Ask your doctor or health care professional about an epinephrine pen and/or other supportive care for certain severe allergic reactions. This medicine is made from human plasma, and there is a small risk that it may contain certain types of viruses or bacteria. All products are processed to kill most viruses and bacteria. If you have questions concerning the risk of infections, discuss them with your doctor or health care professional. What side effects may I notice from receiving this medicine? Side effects that you should report to your doctor or health care  professional as soon as possible:  allergic reactions like skin rash, itching or hives, swelling of the face, lips, or tongue  breathing problems  chest pain, tightness  feeling faint or lightheaded, falls  signs and symptoms of infection like fever or chills; cough; sore throat; pain or trouble passing urine  swelling in your legs or  feet  unusually weak or tired Side effects that usually do not require medical attention (report to your doctor or health care professional if they continue or are bothersome):  diarrhea  dizziness  headache  hot flashes or flushing  joint or muscle pain  nausea  pain, redness, or irritation at site where injected  runny or stuffy nose  tiredness This list may not describe all possible side effects. Call your doctor for medical advice about side effects. You may report side effects to FDA at 1-800-FDA-1088. Where should I keep my medicine? Keep out of the reach of children. Unopened vials may be stored in a refrigerator between 2 and 8 degrees C (36 and 46 degrees F). Do not freeze. The vials may also be kept at room temperature, below 25 degrees C (77 degrees F) for up to 1 month. Do not shake. Do not re-refrigerate once the product has been stored at room temperature. Keep in the original carton until needed for use. Throw away any unused medicine after the expiration date on the label. NOTE: This sheet is a summary. It may not cover all possible information. If you have questions about this medicine, talk to your doctor, pharmacist, or health care provider.  2020 Elsevier/Gold Standard (2016-07-25 19:23:01)

## 2020-02-06 DIAGNOSIS — J449 Chronic obstructive pulmonary disease, unspecified: Secondary | ICD-10-CM | POA: Diagnosis not present

## 2020-02-15 LAB — ALPHA-1 ANTITRYPSIN PHENOTYPE: A-1 Antitrypsin, Ser: 125 mg/dL (ref 83–199)

## 2020-02-19 ENCOUNTER — Telehealth: Payer: Self-pay | Admitting: Primary Care

## 2020-02-19 NOTE — Telephone Encounter (Signed)
Patient of yours with emphysema- Alpha 1 MZ in dec 2020. She has had decline in lung function. Starting patient on [place].

## 2020-02-20 ENCOUNTER — Telehealth: Payer: Self-pay | Admitting: Primary Care

## 2020-02-20 NOTE — Telephone Encounter (Signed)
Atc, line rang X3 then ran to fast busy signal.  Wcb.

## 2020-02-20 NOTE — Telephone Encounter (Signed)
Ok thanks a lot. Might have difficulty getting approved for MZ and might need letter showing decline. Just fyi. Reply not needed. Closing message

## 2020-02-21 ENCOUNTER — Other Ambulatory Visit: Payer: Self-pay | Admitting: Primary Care

## 2020-02-21 NOTE — Telephone Encounter (Signed)
ATC, line rang several times then went to fast busy signal x2. Will try back.

## 2020-02-24 NOTE — Telephone Encounter (Signed)
ATC, line rang several times then went to fast busy signal x2. We have attempted to contact [place] several times with no success or call back from her. Per triage protocol, message will be closed.

## 2020-03-04 ENCOUNTER — Telehealth: Payer: Self-pay | Admitting: Primary Care

## 2020-03-04 DIAGNOSIS — E8801 Alpha-1-antitrypsin deficiency: Secondary | ICD-10-CM

## 2020-03-04 NOTE — Telephone Encounter (Signed)
I meant peer-to-peer for [place]. Thanks

## 2020-03-04 NOTE — Telephone Encounter (Signed)
Yes we can recheck alpha-1 level. Prob not an error. [name] told me it may be difficult to get it approved. Is there any way we could possibly do a peer-to-peer if she does not qualify. Her lung function has decreased in a short period of time.

## 2020-03-04 NOTE — Telephone Encounter (Signed)
Perfect, good to know. thanks

## 2020-03-04 NOTE — Telephone Encounter (Signed)
Yes the home care place will do a medical necessity sheet if denied. Then we can do a peer-to-peer through company

## 2020-03-04 NOTE — Telephone Encounter (Signed)
Gavin Potters from [place] states patient's lab is higher than needs to be for qualifications for Merita Norton ,wants to know if she can get another one drawn to see if it was a error  Beth Please advise

## 2020-03-04 NOTE — Telephone Encounter (Signed)
I have placed the order, we will see if she comes to get it drawn. I do not know how peer-to-peers work with labs since they usually get them drawn then are told they its not covered.  Called Gavin Potters RN with [facility] she will call patient and have patient come to office for a redraw.  Nothing further needed at this time. Will route to [place] as [employer]

## 2020-03-05 ENCOUNTER — Other Ambulatory Visit: Payer: BC Managed Care – PPO

## 2020-03-05 DIAGNOSIS — E8801 Alpha-1-antitrypsin deficiency: Secondary | ICD-10-CM

## 2020-03-14 LAB — ALPHA-1 ANTITRYPSIN PHENOTYPE: A-1 Antitrypsin, Ser: 120 mg/dL (ref 83–199)

## 2020-03-18 ENCOUNTER — Ambulatory Visit (INDEPENDENT_AMBULATORY_CARE_PROVIDER_SITE_OTHER): Payer: BC Managed Care – PPO | Admitting: Primary Care

## 2020-03-18 ENCOUNTER — Other Ambulatory Visit: Payer: Self-pay

## 2020-03-18 ENCOUNTER — Encounter: Payer: Self-pay | Admitting: Primary Care

## 2020-03-18 DIAGNOSIS — J449 Chronic obstructive pulmonary disease, unspecified: Secondary | ICD-10-CM

## 2020-03-18 DIAGNOSIS — J432 Centrilobular emphysema: Secondary | ICD-10-CM

## 2020-03-18 NOTE — Patient Instructions (Signed)
-   Continue Trelegy Ellipta 200- one puff daily in the morning - We will start peer-to-peer for medical necessity for Maria [name] - this may or may not take several weeks to get approved  Follow-up: - 6-8 weeks with [name] or Eustaquio Maize NP

## 2020-03-18 NOTE — Progress Notes (Signed)
Virtual Visit via Telephone Note  I connected with Maria [name] on 03/18/20 at  9:00 AM EDT by telephone and verified that I am speaking with the correct person using two identifiers.  Location: Patient: Home Provider: Office   I discussed the limitations, risks, security and privacy concerns of performing an evaluation and management service by telephone and the availability of in person appointments. I also discussed with the patient that there may be a patient responsible charge related to this service. The patient expressed understanding and agreed to proceed.   History of Present Illness:  64 year old female, former smoker quit in 2017 (0-pack-year history).  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   Patient of [name], seen for initial consult/post COVID follow-up on 03/19/2019.  Admitted on February 26, 2019 with several day history of increased weakness, shortness of breath and nausea/diarrhea.  He was diagnosed with COVID-19 and spent a total of 5 days at [facility].  Chest x-ray on admission showed some basal infiltrates.  Treated with oxygen, Solu-Medrol and Advair.  During follow-up visit she reports feeling a lot more fatigued and reports feeling short of breath all the time.  She is maintained on Trelegy for COPD.  Not currently on oxygen.  Labs, HRCT, ONO and echocardiogram ordered.  Trelegy was stopped and she was started on Pulmicort nebulizer twice daily and DuoNeb's 4 times a day and as needed.  Ordered for new nebulizer machine with aero care. Follow-up in 1 to 2 weeks after testing and needs flu shot during that visit.  Previous [facility] pulmonary encounters: 04/03/2019- FU with Volanda Napoleon, NP Presents today for a 2-week follow-up.  Echocardiogram showed normal systolic function with estimated EF 60 to 65%.  Left ventricle diastolic doppler  consistent with impaired relaxation-determinant filling pressures.  Right ventricle with normal systolic function, increased wall thickness-systolic pressure could not be assessed.  HRCT is scheduled for 9/29.  Patient called on 9/4 asking about portable oxygen tank order.  Reports that her O2 level has read 84 to 85% after exertion with a heart rate of 110.  She reports that her oxygen level recovers greater than 90% after resting.  Is not currently on oxygen at home will need to be walked in office to qualify for POC. Repeat COVID testing was positive, patient reports that she still has lost her taste and smell with increased shortness of breath.   She is feeling a little better today. Family is checking on her. No other help at home. Reports feeling fine when sitting but her O2 drops as low as 85% on exertion. She is able to recover with rest. Taking prednisone taper as prescribed, feels it has helped her chest tightness. She has dry/hacking cough with no mucus production. She is eating and drinking ok. Denies fever.   04/09/2019- FU with Volanda Napoleon, NP Patient presents today for 1 week follow-up. Unfortunately we were unable to get her set up with home health d/t her insurance. She is here today for qualifying O2 walk. She continues to have some shortness of breath and dry cough. Low energy. Her taste and smell as returned some. She is drinking plenty of fluids. Reports that her cough is not quite as bad, states the more she talks she has to cough. Has trouble sleeping at night d/t her respiratory symptoms. Feels delsym and tessalon perles have been helpful. ONO on RA showed O2  low 70% with baseline 87%. Ambulatory O2 today showed O2 desaturation 84% on RA, requiring 3L. Using aero care for nebulizer supplies. Denies fever, weight loss, hemoptysis, purulent mucus.   04/22/2019- FU with Volanda Napoleon, NP Patient presents today for 2 week follow-up. Breathing is the same. Continues to have a dry cough. Completed  prednisone 2 days ago. Using incentive spirometry. She has tried to go without her oxygen at home but states that she gets too short winded. 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 Diffuse bronchial wall thickening with moderate centrilobular and paraseptal emphysema; imaging findings suggestive of underlying COPD. She had COPD symptoms before getting COVID. Her breathing was getting a little worse before her diagnosis.  Sleeping better at night with oxygen. Ambulating with rolling walker, still deconditioned/weak. Due for influenza vaccine today.   07/03/19- FU with [name]  Follow-up emphysema  Follow-up interstitial lung disease post COVID-19 in August 2020 (had elevatd d-dimer) Follow-up chronic hypoxemic respiratory failure secondary to both Maria [name] 64 y.o. -presents for the above issues.  She says her condition is returned to normal.  She is back working as a Forensic psychologist.  However she continues to be still significantly symptomatic.  She had a pulmonary function test June 24, 2019.  I visualized the result.  Shows isolated reduction in diffusion capacity was only moderate at 57%.  FVC and FEV1 are normal.  She continues to be significantly symptomatic as documented below.  She is taking Trelegy.  She says she desaturates to 85% with exertion echocardiogram recently was normal.  She is asking if she should take the Moderna COVID-19 vaccine available in January 2021  08/22/19- FU with [name] Follow-up emphysema - alpha 1 MZ in dec 2020 Follow-up interstitial lung disease post COVID-19 in August 2020 Follow-up chronic hypoxemic respiratory failure secondary to both Ex smoker Echo sept 202 0 - ef 65% and diast dysfn Maria [name] 64 y.o. -presents for follow-up of the above issues.  In the interim she did have serologic work-up for her ILD  that was discovered post Covid setting.  Her ANA and rheumatoid factor were trace positive.  Her ESR was 41.  Therefore we committed her to few to several week prednisone taper.  She says she is on a 5-week taper.  She is got 2 more weeks of this.  She says since her last visit she is feeling worse.  The worsening is in terms of shortness of breath.  Also has dizziness and floaters when she exerts.  In fact on a virtual rehab class she had to get on more oxygen.  Her dyspnea symptom scores are worse than before but cough and fatigue around the same.  She is requiring 3 L now with exertion.  Although in rehab the asked her to increase it to 6 L which helped but this was not documented because of worsening hypoxemia.  Today when we walked her she had to stop several times because of dizziness and fatigue although she did not desaturate.  Her lung function itself in December 2020 shows only isolated reduction in DLCO.  09/16/2019 Patient presents today for 2 week follow-up visit to discuss recent testing results. She is at baseline. No acute complaints.  She is attending pulmonary rehab. Ran out of Trelegy inhaler three days ago, needs refill. Using 2L oxygen at rest and 4-5L on exertion. HRCT  showed no evidence of ILD, nonspecific postinfectious/inflammatory scarring right lung base. Echocardiogram was normal. Bilateral lower extremity ultrasound negative for DVT. Alpha-1 showed MZ phenotype. Her father had emphysema. Reviewed all testing with patient at length and answered any questions. Plan repeat PFTs annually, if decline in lung function may qualify for infusion for alpha-1 deficiency.    11/18/2019  Patient presents today with reports of increased shortness of breath x 1 week. Prior to that her breathing was mostly baseline. She has some days better than others. Associated chest tightness and dry cough. Experiences tightness across her chest which is worse when lying down. She feels better when sleeping in  recliner. Reports constant postnasal drip.Sill doing pulmonary rehab over the phone and during exercises at home. Weight fluctuates.   11/26/2019 Patient contacted today for 2 week follow-up. Her breathing is the same. Reports that her cough is a little worse. She is only taking mucinex in the evening. She did not notice an improvement with prednisone taper. She was previously using two puffs of 100 Trelegy, so no change with new sample of Trelegy 200. She has a flutter valve, uses this in the morning and in the evening twice. She is still attending pulmonary rehab. Ordered for PFTs.   12/17/2019 Patient presents today for 1 month follow-up for COPD/emphysema alpha 1 MZ and post-Covid August 2020. She was seen last month for a video visit. We have since stopped Singulair d/t bad dreams. She continues to have chest tightness and reports productive cough with yellow mucus. Her cough has worsened a little over the last 2-3 weeks. CT chest in February showed no evidence of interstitial lung disease, chronic nonspecific postinfectious/postinflammatory scarring right lung base. Moderate to severe centrilobular and paraseptal emphysema with bronchial wall thickening suggesting COPD. She does have atherosclerosis as well. LDL in March was 122, total cholesterol was 193. She takes low dose Lipitor.    She is currently on 3L pulse at rest and 5L on exertion. Continues participating in pulmonary rehab exercises at her work gym and gets in touch with them daily. She has a pulse oximeter at home which she checks, her levels run between 94-95%.   She has been on prednisone previously but did not notice any significant improvement with steroid.  She uses Ventolin inhaler several times a day and duoneb twice a day. She reports short term improvement only. Denies fever, chills. chest pain, GERD symptoms, N/V/D. PFTs have been scheduled.  02/05/2020  Patient presents today for 6 week follow-up COPD/emphysema alpha-1 MZ and  post covid August 2020.  She has noticed a decline in her breathing. She gets dyspnea easily on exertion. 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 She is current under disciplinary action for being "too slow". She would like prescription to change Trelegy 200 for insurance reasons. She had been taking two puffs of Trelegy 100 Elipta inhaler.  ILD symptom score is increased to 30 (27). Pulmonary function testing showed 6% decline in FEV1. Consideration to start patient on Alpha-1 replacement.    03/18/2020 Patient contacted today for follow-up COPD/emphysema Alpha-1 MZ and post-covid August 2020. Will need peer-to-peer to get approval for Merita Norton, patient has had overal decline in lung function. She is still on Trelegy Ellipta 200. Using Ventolin HFA or Duoneb 2-3 times a day. She gets short winded doing normal ADLs. She is on 5L oxygen on exertion. Coughing a little more. She was recently let go from her job on August 10th as she was unable to  perform the duties that were required of her. Social security should be contacting our office for more information.     SYMPTOM SCALE - ILD 07/03/2019  08/22/2019  12/17/2019  02/05/2020  03/18/2020   O2 use 2L witg exertion 2L rest, 3L sexertion 3L pulsed at rest; 5L on exertion 3L pulsed; 5-6L on exertoin 3L at rest; 5L on exertion  Shortness of Breath 0 -> 5 scale with 5 being worst (score 6 If unable to do) 0 -> 5 scale with 5 being worst (score 6 If unable to do) 0 -> 5 scale with 5 being worst (score 6 If unable to do) 0 -> 5 scale with 5 being worst (score 6 If unable to do) 0 -> 5 scale with 5 being worst (score 6 If unable to do)  At rest 2 3 2.'5 4 3  ' Simple tasks - showers, clothes change, eating, shaving '2 4 4 5 5  ' Household (dishes, doing bed, laundry) 4.'5 5 5 5 5  ' Shopping '5 5 5 5 5  ' Walking level at own pace 5 4 4.'5 5 4  ' Walking up Stairs '5 5 6 6 6  ' Total (40 - 48) Dyspnea Score 23.'5 26 27 30 28  ' How bad is your cough? moderate 3 3.'5  4 4  ' How bad is your fatigue moderate 3 4.5 5 4.5           Pulmonary function testing: 02/05/2020 - FVC 3.25 (92%), FEV1 1.83 (67%), ratio 56, TLC 98%, dlcocor 11.29 (52%)  06/23/20- FVC 3.39 (96%), FEV1 1.99 (73%), ratio 59, TLC 108%, DLCOunc 12.24 (57%)  Labs: 07/03/19 Alpha -1 phenotype MZ 103  03/05/20 Alpha-1 phenoptype MZ 120     Observations/Objective:  - Able to speak in full sentences; mild shortness of breath when speaking  Assessment and Plan:  Moderate COPD/emphysema - Alpha 1 phenotype MZ. HRCT in February 2021 showed moderate to severe centrilobular and paraseptal emphysema - Overall, patient is extremely limited by her COPD. She experiences moderate-severe dyspnea with most all ADLs. She was recently let go from her job as she was not able to perform duties required of her. She remains on Triple therapy with Trelegy 200 and is on 5L oxygen on exertion. Waiting to get approval for Merita Norton, will need to start peer-to-peer for medical necessity d/t decline in lung function   ILD: - Patient had Covid-06 March 2019 - HRCT in February 2021 showed no compelling evidence for interstitial lung diease. Chronic parenchymal band at the dependent right lung base compatible with nonspecific postinfectious/postinflammatory scarring  Follow Up Instructions:  - 6-8 weeks with [name] or Eustaquio Maize NP    I discussed the assessment and treatment plan with the patient. The patient was provided an opportunity to ask questions and all were answered. The patient agreed with the plan and demonstrated an understanding of the instructions.   The patient was advised to call back or seek an in-person evaluation if the symptoms worsen or if the condition fails to improve as anticipated.  I provided 15 minutes of non-face-to-face time during this encounter.   Martyn Ehrich, NP

## 2020-03-19 ENCOUNTER — Other Ambulatory Visit: Payer: Self-pay | Admitting: Family Medicine

## 2020-03-19 ENCOUNTER — Ambulatory Visit (INDEPENDENT_AMBULATORY_CARE_PROVIDER_SITE_OTHER): Payer: BC Managed Care – PPO | Admitting: Primary Care

## 2020-03-19 DIAGNOSIS — E782 Mixed hyperlipidemia: Secondary | ICD-10-CM

## 2020-03-19 DIAGNOSIS — E8801 Alpha-1-antitrypsin deficiency: Secondary | ICD-10-CM

## 2020-03-19 DIAGNOSIS — J438 Other emphysema: Secondary | ICD-10-CM

## 2020-03-19 NOTE — Patient Instructions (Signed)
Maria [name] was denied during PEER-TO-PEER

## 2020-03-19 NOTE — Progress Notes (Signed)
Repeat fasting lipids.

## 2020-03-19 NOTE — Progress Notes (Signed)
Peer-to-peer for [employer]. Patient has hx moderate-severe emphysema and COPD. Alpha 1 phentype type MZ, initial serum level 103. She has had 8% decline in FEV1 and 7% decrease in diffusion capacity since December 2020. Patient is not currently smoking, quit in 2017. She has significant impart in her quality of life from COPD symptoms, recently lost her job as she could not perform tasks that were asked of her. She has hard time breathing and is on 5L oxygen at baseline.   Pulmonary testing: 06/23/20 PFTs- FEV1 1.99 (73%) p BD  01/16/20 PFTs - FEV1 1.75 (65%) p trelegy   Based on her phenotype of MZ and normal serum level Glassia medication was denied

## 2020-03-20 ENCOUNTER — Telehealth: Payer: Self-pay | Admitting: Primary Care

## 2020-03-20 NOTE — Telephone Encounter (Signed)
Spoke with Nicki Reaper, aware of recs.  States they will restart process when patient [employer], and if anything further is needed from our office they will reach back out.  Nothing further needed at this time- will close encounter.

## 2020-03-20 NOTE — Telephone Encounter (Signed)
Called and spoke with [place] with Bernita Buffy.     Peer-to-peer was denied for the [place] but Nicki Reaper states that the insurance that the peer-to-peer was denied on is no longer going to be [employer]. Nicki Reaper said that he found out that pt is going to be going onto her [employer] said when pt does get her new insurance, the prior authorization can be redone once pt does obtain new insurance.  Scott wants to know we would like to redo the process to see if we could get medication approved once pt does have the new insurance coverage. Beth, please advise.

## 2020-03-20 NOTE — Telephone Encounter (Signed)
Yes please, thanks Raquel Sarna

## 2020-03-26 ENCOUNTER — Other Ambulatory Visit: Payer: Self-pay | Admitting: Primary Care

## 2020-04-02 ENCOUNTER — Other Ambulatory Visit (INDEPENDENT_AMBULATORY_CARE_PROVIDER_SITE_OTHER): Payer: BC Managed Care – PPO

## 2020-04-02 ENCOUNTER — Other Ambulatory Visit: Payer: Self-pay

## 2020-04-02 DIAGNOSIS — E782 Mixed hyperlipidemia: Secondary | ICD-10-CM | POA: Diagnosis not present

## 2020-04-02 LAB — LIPID PANEL
Cholesterol: 143 mg/dL (ref 0–200)
HDL: 47.5 mg/dL (ref >39.00)
LDL Cholesterol: 77 mg/dL (ref 0–99)
NonHDL: 95.76
Total CHOL/HDL Ratio: 3
Triglycerides: 95 mg/dL (ref 0.0–149.0)
VLDL: 19 mg/dL (ref 0.0–40.0)

## 2020-04-09 ENCOUNTER — Other Ambulatory Visit: Payer: Self-pay | Admitting: Primary Care

## 2020-04-28 ENCOUNTER — Other Ambulatory Visit: Payer: Self-pay | Admitting: Primary Care

## 2020-04-29 ENCOUNTER — Ambulatory Visit: Payer: BC Managed Care – PPO | Admitting: Internal Medicine

## 2020-04-29 ENCOUNTER — Encounter: Payer: Self-pay | Admitting: Internal Medicine

## 2020-04-29 ENCOUNTER — Other Ambulatory Visit: Payer: Self-pay

## 2020-04-29 VITALS — BP 120/70 | HR 74 | Ht 65.0 in | Wt 183.2 lb

## 2020-04-29 DIAGNOSIS — J9611 Chronic respiratory failure with hypoxia: Secondary | ICD-10-CM | POA: Diagnosis not present

## 2020-04-29 DIAGNOSIS — Z87891 Personal history of nicotine dependence: Secondary | ICD-10-CM

## 2020-04-29 DIAGNOSIS — Z7185 Encounter for immunization safety counseling: Secondary | ICD-10-CM | POA: Diagnosis not present

## 2020-04-29 DIAGNOSIS — J438 Other emphysema: Secondary | ICD-10-CM

## 2020-04-29 DIAGNOSIS — Z8616 Personal history of COVID-19: Secondary | ICD-10-CM

## 2020-04-29 DIAGNOSIS — E8801 Alpha-1-antitrypsin deficiency: Secondary | ICD-10-CM

## 2020-04-29 NOTE — Patient Instructions (Addendum)
ICD-10-CM   1. Chronic respiratory failure with hypoxia (HCC)  J96.11   2. Emphysema due to alpha-1-antitrypsin deficiency ([place])  J43.8    E88.01   3. Vaccine counseling  Z71.85   4. Stopped smoking with greater than 40 pack year history  Z87.891   5. History of COVID-19  Z86.16    Overall clinically stable  Plan -Continue oxygen 3 L at rest and 5 L with exertion -Awaiting approval of GLASSIA and commencement  -nurse practitioner Derl Barrow is working on this -Continue Trelegy 3 L at rest and 5 L with exertion -Advised Covid booster vaccine -Do follow-up CT scan of the chest in 6 months from now  Follow-up -6 months or sooner if needed but after CT scan of the chest; CAT score at follow-up  -

## 2020-04-29 NOTE — Progress Notes (Signed)
2020  Subjective:  Patient ID: Maria [name], female , DOB: 1955-08-16 , age 64 y.o. , MRN: [id number] , ADDRESS: [place] [id number]   03/19/2019 -   Chief Complaint  Patient presents with  . Hospitalization Follow-up    Post covid symptoms   Post COVID  follow-up in the pulmonary office  HPI ZHANIYA [name] 64 y.o. 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  And also poor intake.  Was diagnosed to have acute COVID-19.  She remembers being on oxygen.  At the time prior to admission she is had chronic hip pain based on her history and review of the records for which she was using a cane.  She spent a total of 5 days at [facility] [place] [facility] and was discharged on March 03, 2019.  She was treated with oxygen, Solu-Medrol and REM does Advair.  Last set of laboratories reviewed on March 03, 2019 which showed that she had normal hemoglobin and creatinine and his CRP had improved.  The only chest x-ray that was available is 1 on admission in February 26, 2019 but she has some basal infiltrates.  She now presents to the pulmonary clinic on March 19, 2019.  It is 21 days since admission and she is over 21 days since her first set of symptoms.  She tells me that she is a lot more fatigue.  She is so fatigued that she is not even able to take her inhaler properly which is Trelegy for her COPD.  She feels short of breath all the time.  She is requiring a lot of help taking a shower.  Her hip pain continues.  She is now needing the use of a walker.  Her ECOG is 4.  Her appetite is good.  She is currently not using oxygen either at night or in the daytime.   Results for TENSLEY, WERY" (MRN [id number]) as  of 08/22/2019 12:57  Ref. Range 03/01/2019 03:22 03/02/2019 01:55 03/03/2019 01:54  D-Dimer, America Brown Latest Ref Range: 0.00 - 0.50 ug/mL-FEU 1.34 (H) 1.12 (H) 1.28 (H)       04/03/2019 Presents today for a 2-week follow-up.  Echocardiogram showed normal systolic function with estimated EF 60 to 65%.  Left ventricle diastolic doppler consistent with impaired relaxation-determinant filling pressures.  Right ventricle with normal systolic function, increased wall thickness-systolic pressure could not be assessed.  HRCT is scheduled for 9/29.  Patient called on 9/4 asking about portable oxygen tank order.  Reports that her O2 level has read 84 to 85% after exertion with a heart rate of 110.  She reports that her oxygen level recovers greater than 90% after resting.  Is not currently on oxygen at home will need to be walked in office to qualify for POC. Repeat COVID testing was positive, patient reports that she still has lost her taste and smell with increased shortness of breath.   She is feeling a little better today. Family is checking on her. No other help at home. Reports feeling fine when sitting but her O2 drops as low as 85% on exertion. She is able to recover with rest. Taking prednisone taper as prescribed, feels it has helped her chest tightness. She has dry/hacking  cough with no mucus production. She is eating and drinking ok. Denies fever.   04/09/2019 Patient presents today for 1 week follow-up. Unfortunately we were unable to get her set up with home health d/t her insurance. She is here today for qualifying O2 walk. She continues to have some shortness of breath and dry cough. Low energy. Her taste and smell as returned some. She is drinking plenty of fluids. Reports that her cough is not quite as bad, states the more she talks she has to cough. Has trouble sleeping at night d/t her respiratory symptoms. Feels delsym and tessalon perles have been helpful. ONO on RA showed O2 low 70% with baseline  87%. Ambulatory O2 today showed O2 desaturation 84% on RA, requiring 3L. Using [employer] for nebulizer supplies. Denies fever, weight loss, hemoptysis, purulent mucus.   04/22/2019 Patient presents today for 2 week follow-up. Breathing is the same. Continues to have a dry cough. Completed prednisone 2 days ago. Using incentive spirometry. She has tried to go without her oxygen at home but states that she gets too short winded. 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 Diffuse bronchial wall thickening with moderate centrilobular and paraseptal emphysema; imaging findings suggestive of underlying COPD. She had COPD symptoms before getting COVID. Her breathing was getting a little worse before her diagnosis.  Sleeping better at night with oxygen. Ambulating with rolling walker, still deconditioned/weak. Due for influenza vaccine today.   Oxygen readings:  Resting O2 93% on RA  Ambulatory O2 98-100 on 3L Ambulatory O2 85% on RA  OV 07/03/2019  Subjective:  Patient ID: Maria [name], female , DOB: [birth year] , age 27 y.o. , MRN: [id number] , ADDRESS: [place] [id number]   07/03/2019 -   Chief Complaint  Patient presents with  . Follow-up    PFT performed 12/7 and pt is here following up after that. Pt states she is still becoming SOB with exertion.  Follow-up emphysema Follow-up interstitial lung disease post COVID-19 in August 2020 (had elevatd d-dimer) Follow-up chronic hypoxemic respiratory failure secondary to both   HPI Maria [name] 64 y.o. -presents for the above issues.  She says her condition is returned to normal.  She is back working as a Forensic psychologist.  However she continues to be still significantly symptomatic.  She had a pulmonary function test June 24, 2019.  I visualized the result.  Shows isolated reduction in diffusion  capacity was only moderate at 57%.  FVC and FEV1 are normal.  She continues to be significantly symptomatic as documented below.  She is taking Trelegy.  She says she desaturates to 85% with exertion echocardiogram recently was normal.  She is asking if she should take the Moderna COVID-19 vaccine available in January 2021         IMPRESSION: CT chest 04/16/2019 1. 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 Given the presence of some bronchiectasis, this is technically characterized as probable UIP per current ATS guidelines, however, that is not favored. If there is persistent clinical concern for interstitial lung disease, repeat high-resolution chest CT would be recommended in 12 months to assess for temporal changes in the appearance of the lung parenchyma. 2. Diffuse bronchial wall thickening with moderate centrilobular and paraseptal  emphysema; imaging findings suggestive of underlying COPD. 3. Aortic atherosclerosis, in addition to right coronary artery disease. Please note that although the presence of coronary artery calcium documents the presence of coronary artery disease, the severity of this disease and any potential stenosis cannot be assessed on this non-gated CT examination. Assessment for potential risk factor modification, dietary therapy or pharmacologic therapy may be warranted, if clinically indicated.  Aortic Atherosclerosis (ICD10-I70.0) and Emphysema (ICD10-J43.9).   Electronically Signed   By: Vinnie Langton M.D.   On: 04/16/2019 13:12   OV 08/22/2019  Subjective:  Patient ID: Maria [name], female , DOB: 20-Jun-1956 , age 14 y.o. , MRN: [id number] , ADDRESS: [place] [address]   08/22/2019 -   Chief Complaint  Patient presents with  . Follow-up    History of 2019 Novel coronavirus disease  (COVID-19)   Follow-up emphysema - alpha 1 MZ in dec 2020 Follow-up interstitial lung disease post COVID-19 in August 2020 Follow-up chronic hypoxemic respiratory failure secondary to both Ex smoker Echo sept 202 0 - ef 65% and diast dysfn  HPI SUMEYA YONTZ 64 y.o. -presents for follow-up of the above issues.  In the interim she did have serologic work-up for her ILD that was discovered post Covid setting.  Her ANA and rheumatoid factor were trace positive.  Her ESR was 41.  Therefore we committed her to few to several week prednisone taper.  She says she is on a 5-week taper.  She is got 2 more weeks of this.  She says since her last visit she is feeling worse.  The worsening is in terms of shortness of breath.  Also has dizziness and floaters when she exerts.  In fact on a virtual rehab class she had to get on more oxygen.  Her dyspnea symptom scores are worse than before but cough and fatigue around the same.  She is requiring 3 L now with exertion.  Although in rehab the asked her to increase it to 6 L which helped but this was not documented because of worsening hypoxemia.  Today when we walked her she had to stop several times because of dizziness and fatigue although she did not desaturate.  Her lung function itself in December 2020 shows only isolated reduction in DLCO.   Of note she is alpha-1 MZ from her labs at last visit.    OV 04/29/2020   Subjective:  Patient ID: Maria [name], female , DOB: 1955/10/29, age 12 y.o. years. , MRN: [id number],  ADDRESS: [place] [address] PCP  [name], MD Providers : Treatment Team:  Attending Provider: Brand Males, MD Patient Care Team: [name], MD as PCP - General (Family Medicine)   Follow-up emphysema - alpha 1 MZ in dec 2020 Follow-up interstitial lung disease post COVID-19 in August 2020 -> feb 2021 Case conf ILD: NO ILD Follow-up chronic hypoxemic respiratory failure secondary to both Ex  smoker Echo sept 202 0 - ef 65% and diast dysfn Last CT scan of the chest February 2021  Chief Complaint  Patient presents with  . Follow-up    Pt states that she has had good days and bad days since last visit. Pt said that she is still having problems with SOB also has an occ cough.       HPI SHLOKA BALDRIDGE 64 y.o. -returns for follow-up.  Last seen by myself in February 2021.  After that she is seen nurse practitioner.  Alpha-1 replacement was advised by nurse practitioner.  Initially denied by insurance but now apparently approved by insurance.  The still waiting for some of paperwork to be clear.  Currently on Trelegy.  She is on oxygen 3 L at rest 5 L with exertion.  She is fully vaccinated.  She is in need of Covid booster.  Overall doing well.  No flowsheet data found.        Simple office walk 185 feet x  3 laps goal with forehead probe 08/22/2019   O2 used 3L  Number laps completed Did only 2 of 3. Stopped 8 times and site down x 2. Got dizzy and floaters in eyes  Comments about pace Very slow pace  Resting Pulse Ox/HR 99% and 91/min  Final Pulse Ox/HR 99% during entire walk  Desaturated </= 88% x  Desaturated <= 3% points x  Got Tachycardic >/= 90/min yes  Symptoms at end of test As above  Miscellaneous comments Sub maximal exertion     Las\bs - alph 1 MZ in dec 2020 - autoimmune  - ANA 1:40, RF 23, and ESR 40s   PFT dec 2020  - isolated reduction in dlco 57%   PFT Results Latest Ref Rng & Units 01/16/2020 06/24/2019  FVC-Pre L 3.17 2.93  FVC-Predicted Pre % 90 83  FVC-Post L 3.25 3.39  FVC-Predicted Post % 92 96  Pre FEV1/FVC % % 55 56  Post FEV1/FCV % % 56 59  FEV1-Pre L 1.75 1.64  FEV1-Predicted Pre % 65 60  FEV1-Post L 1.83 1.99  DLCO uncorrected ml/min/mmHg 10.81 12.24  DLCO UNC% % 50 57  DLCO corrected ml/min/mmHg 11.29 -  DLCO COR %Predicted % 52 -  DLVA Predicted % 61 60  TLC L 5.25 5.81  TLC % Predicted % 98 108  RV % Predicted % 86 110       has a past medical history of Atherosclerosis (1/0612014), Blood transfusion without reported diagnosis (2005), Cancer ([place]) (Ovarian 1978), Chest pain, COPD (chronic obstructive pulmonary disease) ([place]), Emphysema of lung ([place]) (2019), and Oxygen deficiency (Sept 2020).   reports that she quit smoking about 4 years ago. Her smoking use included cigarettes. She has a 45.00 pack-year smoking history. She has never used smokeless tobacco.  Past Surgical History:  Procedure Laterality Date  . ABDOMINAL HYSTERECTOMY  1978  . CHOLECYSTECTOMY  2010  . NECK SURGERY  1999  . OVARIAN CYST REMOVAL  1988  . SPINE SURGERY  Neck 2002    Allergies  Allergen Reactions  . Montelukast     Bad dreams    Immunization History  Administered Date(s) Administered  . Influenza, Seasonal, Injecte, Preservative Fre 04/28/2008, 04/09/2010  . Influenza,inj,Quad PF,6+ Mos 04/22/2019, 04/25/2020  . Influenza-Unspecified 06/01/2008, 05/10/2016, 03/18/2017  . Moderna SARS-COVID-2 Vaccination 07/30/2019, 08/28/2019  . Pneumococcal Polysaccharide-23 04/15/2015, 04/22/2020  . Tdap 11/17/2009  . Zoster Recombinat (Shingrix) 04/16/2020    Family History  Problem Relation Age of Onset  . Heart failure Mother   . Diabetes Mother   . Kidney disease Mother   . Hypertension Mother   . Arthritis Mother   . Heart disease Mother   . Obesity Mother   . COPD Father   . Hearing loss Father   . Lung cancer Brother   . Asthma Son   . Diabetes Son   . Birth defects Son   . COPD Brother   . COPD Brother      Current Outpatient Medications:  .  aspirin EC 81 MG tablet, Take 1 tablet (81 mg total) by mouth daily., Disp: 90 tablet, Rfl: 3 .  atorvastatin (LIPITOR) 10 MG tablet, Take 1 tablet (10 mg total) by mouth daily., Disp: 90 tablet, Rfl: 3 .  azelastine (ASTELIN) 0.1 % nasal spray, PLACE 1 SPRAY INTO BOTH NOSTRILS 2 (TWO) TIMES DAILY, Disp: 30 mL, Rfl: 1 .  Fluticasone-Umeclidin-Vilant (TRELEGY  ELLIPTA) 200-62.5-25 MCG/INH AEPB, Inhale 1 puff into the lungs daily., Disp: 60 each, Rfl: 5 .  ipratropium-albuterol (DUONEB) 0.5-2.5 (3) MG/3ML SOLN, USE 1 AMPULE IN NEBULIZER 4 TIMES A DAY AS NEEDED    (Dx: J84.112, J44.9), Disp: 360 mL, Rfl: 5 .  OXYGEN, Inhale into the lungs. 2 liters at rest and 5 liters with any activity.-, Disp: , Rfl:  .  pantoprazole (PROTONIX) 20 MG tablet, TAKE 1 TABLET BY MOUTH TWICE A DAY, Disp: 180 tablet, Rfl: 1 .  Respiratory Therapy Supplies (FLUTTER) DEVI, Use as directed, Disp: 1 each, Rfl: 0 .  VENTOLIN HFA 108 (90 Base) MCG/ACT inhaler, Inhale 2 puffs into the lungs every 6 (six) hours as needed for shortness of breath., Disp: , Rfl:  .  nortriptyline (PAMELOR) 25 MG capsule, Take 25 mg by mouth at bedtime., Disp: , Rfl:       Objective:   Vitals:   04/29/20 1010  BP: 120/70  Pulse: 74  SpO2: 99%  Weight: 83.1 kg  Height: '5\' 5"'  (1.651 m)    Estimated body mass index is 30.49 kg/m as calculated from the following:   Height as of this encounter: '5\' 5"'  (1.651 m).   Weight as of this encounter: 83.1 kg.  '@WEIGHTCHANGE' @  Filed Weights   04/29/20 1010  Weight: 83.1 kg     Physical Exam    General: No distress. Looks stable Neuro: Alert and Oriented x 3. GCS 15. Speech normal Psych: Pleasant Resp:  Barrel Chest - no.  Wheeze - no, Crackles - no, No overt respiratory distress CVS: Normal heart sounds. Murmurs - no Ext: Stigmata of Connective Tissue Disease - no HEENT: Normal upper airway. PEERL +. No post nasal drip        Assessment:       ICD-10-CM   1. Chronic respiratory failure with hypoxia (HCC)  J96.11   2. Emphysema due to alpha-1-antitrypsin deficiency ([place])  J43.8    E88.01   3. Vaccine counseling  Z71.85   4. Stopped smoking with greater than 40 pack year history  Z87.891   5. History of COVID-19  Z86.16        Plan:     Patient Instructions     ICD-10-CM   1. Chronic respiratory failure with hypoxia (HCC)   J96.11   2. Emphysema due to alpha-1-antitrypsin deficiency ([place])  J43.8    E88.01   3. Vaccine counseling  Z71.85   4. Stopped smoking with greater than 40 pack year history  Z87.891   5. History of COVID-19  Z86.16    Overall clinically stable  Plan -Continue oxygen 3 L at rest and 5 L with exertion -Awaiting approval of GLASSIA and commencement  -nurse practitioner Derl Barrow is working on this -Continue Trelegy 3 L at rest and 5 L with exertion -Advised Covid booster vaccine -Do follow-up CT scan of the chest in 6 months from now  Follow-up -6 months or sooner if needed but after CT scan of the chest; CAT score at follow-up  -       SIGNATURE  Dr. Brand Males, M.D., F.C.C.P,  Pulmonary and Critical Care Medicine Staff Physician, [place] Director - Interstitial Lung Disease  Program  Pulmonary [place] [facility] [place], [place], [address]  Pager: [phone], If no answer or between  15:00h - 7:00h: call [phone] Telephone: [phone]  10:36 AM 04/29/2020

## 2020-04-29 NOTE — Addendum Note (Signed)
Addended by: Lorretta Harp on: 04/29/2020 10:43 AM   Modules accepted: Orders

## 2020-05-05 ENCOUNTER — Telehealth: Payer: Self-pay | Admitting: Primary Care

## 2020-05-05 NOTE — Telephone Encounter (Signed)
Peer-to-peer for [employer]. Phone called was scheduled with Luvenia Heller, he hung up before I was able to speak with him. Called back and there was 30 min wait. Peer-to-peer to be re-scheduled.  64 year old patient, hx moderate-severe emphysema and COPD. Alpha 1 phentype type MZ, initial serum level 103. She has had 8% decline in FEV1 and 7% decrease in diffusion capacity since December 2020. Patient is not currently smoking, quit in 2017. She has significant impart in her quality of life from COPD symptoms, recently lost her job as she could not perform tasks that were asked of her. She has hard time breathing and is on 3-5L oxygen at baseline.   Pulmonary testing: 06/23/20 PFTs- FEV1 1.99 (73%) p BD  01/16/20 PFTs - FEV1 1.75 (65%) p trelegy

## 2020-05-08 ENCOUNTER — Telehealth: Payer: Self-pay | Admitting: Primary Care

## 2020-05-08 NOTE — Telephone Encounter (Signed)
ERROR

## 2020-05-11 NOTE — Telephone Encounter (Signed)
Good morning Beth,  I know you are out of the office today.  When you have a chance, please review patient message from [place] and advise.  Thank you.

## 2020-05-12 NOTE — Telephone Encounter (Signed)
I was told on Friday we should have an answer in 72 hours. May need to do a second peer-to-peer. I should hopefully hear something this week. The rep from [facility] and home nursing are very involved and we have been working on this.

## 2020-05-18 NOTE — Telephone Encounter (Signed)
Were going to appeal it. If denied again no other options at this time. Would recommend she schedule her next follow-up with MR

## 2020-05-18 NOTE — Telephone Encounter (Signed)
Maria [name], please see mychart message from pt and advise.

## 2020-05-28 NOTE — Telephone Encounter (Signed)
Patient was seen by MR on 04/29/20

## 2020-06-09 NOTE — Telephone Encounter (Signed)
I spoke with the drug rep today, they are still waiting for a response. No updates yet. We have a lot of people keeping a close eye on this.

## 2020-06-09 NOTE — Telephone Encounter (Signed)
Is pharmacy handling approvals for Maria [name]?

## 2020-06-09 NOTE — Telephone Encounter (Signed)
Beth,  Please advise if there is an update on pt's appeal. Thanks.

## 2020-06-09 NOTE — Telephone Encounter (Signed)
Message from patient  I know you do. Thank you Beth for everything you do.

## 2020-06-09 NOTE — Telephone Encounter (Signed)
No, we currently handle asthma biologics and ofev/esbriet.

## 2020-06-30 NOTE — Telephone Encounter (Signed)
We are going to try and appeal it one last time, I will likely do a peer to peer hopefully with a physician. I'm sorry this hasn't worked out. I appreciate her patience. Recommend she has a follow up with MR next

## 2020-06-30 NOTE — Telephone Encounter (Signed)
Beth,  This message was received this morning from Patient.  I received a packet from [facility] saying that denied the fusions. Thank you for all you tried to do.  [employer] routed to [place], NP

## 2020-08-05 NOTE — Telephone Encounter (Signed)
Patient sent email asking about where the alpha 1 injection is with [employer].  Beth please advise.

## 2020-08-05 NOTE — Telephone Encounter (Signed)
Maria [name] can you please let her know we have to send a letter requesting an appeal. They denied approval for glassia. I'm not confident this will change anything but we are still working on it. We have 180 days to appeal their decision. If she is having worsening symptoms related to COPD recommend she make a follow-up visit with [name].   Can you please draft up a letter requesting second level appeal review for the denial of Glassia.   Address is: [employer] [address] [place] Fax [phone]

## 2020-08-06 NOTE — Telephone Encounter (Signed)
Beth, please advise if there is anything in particular that we need to say other than requesting second level appeal review for the denial of Glassia.

## 2020-08-07 ENCOUNTER — Encounter: Payer: Self-pay | Admitting: *Deleted

## 2020-08-07 NOTE — Telephone Encounter (Signed)
Letter has been written and has been signed by [employer]. This has also been faxed to [employer]. Nothing further needed.

## 2020-08-07 NOTE — Telephone Encounter (Signed)
I do not believe so. Name is Maria [name] and Appeal ID is [phone]

## 2020-08-27 ENCOUNTER — Ambulatory Visit: Payer: BC Managed Care – PPO | Admitting: Internal Medicine

## 2020-08-27 ENCOUNTER — Encounter: Payer: Self-pay | Admitting: Internal Medicine

## 2020-08-27 ENCOUNTER — Other Ambulatory Visit: Payer: Self-pay

## 2020-08-27 VITALS — BP 130/80 | HR 72 | Temp 98.0°F | Ht 65.0 in | Wt 184.2 lb

## 2020-08-27 DIAGNOSIS — J449 Chronic obstructive pulmonary disease, unspecified: Secondary | ICD-10-CM

## 2020-08-27 DIAGNOSIS — J9611 Chronic respiratory failure with hypoxia: Secondary | ICD-10-CM | POA: Diagnosis not present

## 2020-08-27 DIAGNOSIS — Z87891 Personal history of nicotine dependence: Secondary | ICD-10-CM

## 2020-08-27 DIAGNOSIS — Z129 Encounter for screening for malignant neoplasm, site unspecified: Secondary | ICD-10-CM

## 2020-08-27 DIAGNOSIS — Z148 Genetic carrier of other disease: Secondary | ICD-10-CM

## 2020-08-27 DIAGNOSIS — R059 Cough, unspecified: Secondary | ICD-10-CM

## 2020-08-27 DIAGNOSIS — U099 Post covid-19 condition, unspecified: Secondary | ICD-10-CM

## 2020-08-27 DIAGNOSIS — R053 Chronic cough: Secondary | ICD-10-CM

## 2020-08-27 MED ORDER — PREDNISONE 10 MG PO TABS: ORAL_TABLET | ORAL | 0 refills | Status: AC

## 2020-08-27 NOTE — Progress Notes (Signed)
2020  Subjective:  Patient ID: Maria [name], female , DOB: [birth year] , age 65 y.o. , MRN: [id number] , ADDRESS: [place] [address]   03/19/2019 -   Chief Complaint  Patient presents with  . Hospitalization Follow-up    Post covid symptoms   Post COVID  follow-up in the pulmonary office  HPI Maria [name] 64 y.o. 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  And also poor intake.  Was diagnosed to have acute COVID-19.  She remembers being on oxygen.  At the time prior to admission she is had chronic hip pain based on her history and review of the records for which she was using a cane.  She spent a total of 5 days at [facility] the [place] [facility] and was discharged on March 03, 2019.  She was treated with oxygen, Solu-Medrol and REM does Advair.  Last set of laboratories reviewed on March 03, 2019 which showed that she had normal hemoglobin and creatinine and his CRP had improved.  The only chest x-ray that was available is 1 on admission in February 26, 2019 but she has some basal infiltrates.  She now presents to the pulmonary clinic on March 19, 2019.  It is 21 days since admission and she is over 21 days since her first set of symptoms.  She tells me that she is a lot more fatigue.  She is so fatigued that she is not even able to take her inhaler properly which is Trelegy for her COPD.  She feels short of breath all the time.  She is requiring a lot of help taking a shower.  Her hip pain continues.  She is now needing the use of a walker.  Her ECOG is 4.  Her appetite is good.  She is currently not using oxygen either at night or in the daytime.   Results for DORENDA, PFANNENSTIEL" (MRN [id number])  as of 08/22/2019 12:57  Ref. Range 03/01/2019 03:22 03/02/2019 01:55 03/03/2019 01:54  D-Dimer, America Brown Latest Ref Range: 0.00 - 0.50 ug/mL-FEU 1.34 (H) 1.12 (H) 1.28 (H)       04/03/2019 Presents today for a 2-week follow-up.  Echocardiogram showed normal systolic function with estimated EF 60 to 65%.  Left ventricle diastolic doppler consistent with impaired relaxation-determinant filling pressures.  Right ventricle with normal systolic function, increased wall thickness-systolic pressure could not be assessed.  HRCT is scheduled for 9/29.  Patient called on 9/4 asking about portable oxygen tank order.  Reports that her O2 level has read 84 to 85% after exertion with a heart rate of 110.  She reports that her oxygen level recovers greater than 90% after resting.  Is not currently on oxygen at home will need to be walked in office to qualify for POC. Repeat COVID testing was positive, patient reports that she still has lost her taste and smell with increased shortness of breath.   She is feeling a little better today. Family is checking on her. No other help at home. Reports feeling fine when sitting but her O2 drops as low as 85% on exertion. She is able to recover with rest. Taking prednisone taper as prescribed, feels it has helped her chest tightness. She has  dry/hacking cough with no mucus production. She is eating and drinking ok. Denies fever.   04/09/2019 Patient presents today for 1 week follow-up. Unfortunately we were unable to get her set up with home health d/t her insurance. She is here today for qualifying O2 walk. She continues to have some shortness of breath and dry cough. Low energy. Her taste and smell as returned some. She is drinking plenty of fluids. Reports that her cough is not quite as bad, states the more she talks she has to cough. Has trouble sleeping at night d/t her respiratory symptoms. Feels delsym and tessalon perles have been helpful. ONO on RA showed O2 low 70% with  baseline 87%. Ambulatory O2 today showed O2 desaturation 84% on RA, requiring 3L. Using [employer] for nebulizer supplies. Denies fever, weight loss, hemoptysis, purulent mucus.   04/22/2019 Patient presents today for 2 week follow-up. Breathing is the same. Continues to have a dry cough. Completed prednisone 2 days ago. Using incentive spirometry. She has tried to go without her oxygen at home but states that she gets too short winded. 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 Diffuse bronchial wall thickening with moderate centrilobular and paraseptal emphysema; imaging findings suggestive of underlying COPD. She had COPD symptoms before getting COVID. Her breathing was getting a little worse before her diagnosis.  Sleeping better at night with oxygen. Ambulating with rolling walker, still deconditioned/weak. Due for influenza vaccine today.   Oxygen readings:  Resting O2 93% on RA  Ambulatory O2 98-100 on 3L Ambulatory O2 85% on RA  OV 07/03/2019  Subjective:  Patient ID: Maria [name], female , DOB: [birth year] , age 39 y.o. , MRN: [id number] , ADDRESS: [place] [address]   07/03/2019 -   Chief Complaint  Patient presents with  . Follow-up    PFT performed 12/7 and pt is here following up after that. Pt states she is still becoming SOB with exertion.  Follow-up emphysema Follow-up interstitial lung disease post COVID-19 in August 2020 (had elevatd d-dimer) Follow-up chronic hypoxemic respiratory failure secondary to both   HPI Maria [name] 65 y.o. -presents for the above issues.  She says her condition is returned to normal.  She is back working as a Forensic psychologist.  However she continues to be still significantly symptomatic.  She had a pulmonary function test June 24, 2019.  I visualized the result.  Shows isolated reduction in  diffusion capacity was only moderate at 57%.  FVC and FEV1 are normal.  She continues to be significantly symptomatic as documented below.  She is taking Trelegy.  She says she desaturates to 85% with exertion echocardiogram recently was normal.  She is asking if she should take the Moderna COVID-19 vaccine available in January 2021         IMPRESSION: CT chest 04/16/2019 1. 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 Given the presence of some bronchiectasis, this is technically characterized as probable UIP per current ATS guidelines, however, that is not favored. If there is persistent clinical concern for interstitial lung disease, repeat high-resolution chest CT would be recommended in 12 months to assess for temporal changes in the appearance of the lung parenchyma. 2. Diffuse bronchial wall thickening with moderate centrilobular and  paraseptal emphysema; imaging findings suggestive of underlying COPD. 3. Aortic atherosclerosis, in addition to right coronary artery disease. Please note that although the presence of coronary artery calcium documents the presence of coronary artery disease, the severity of this disease and any potential stenosis cannot be assessed on this non-gated CT examination. Assessment for potential risk factor modification, dietary therapy or pharmacologic therapy may be warranted, if clinically indicated.  Aortic Atherosclerosis (ICD10-I70.0) and Emphysema (ICD10-J43.9).   Electronically Signed   By: Vinnie Langton M.D.   On: 04/16/2019 13:12   OV 08/22/2019  Subjective:  Patient ID: Maria [name], female , DOB: 02/01/1956 , age 74 y.o. , MRN: [id number] , ADDRESS: [place] [address]   08/22/2019 -   Chief Complaint  Patient presents with  . Follow-up    History of 2019 Novel coronavirus  disease (COVID-19)   Follow-up emphysema - alpha 1 MZ in dec 2020 Follow-up interstitial lung disease post COVID-19 in August 2020 Follow-up chronic hypoxemic respiratory failure secondary to both Ex smoker Echo sept 202 0 - ef 65% and diast dysfn  HPI AAILYAH DUNBAR 65 y.o. -presents for follow-up of the above issues.  In the interim she did have serologic work-up for her ILD that was discovered post Covid setting.  Her ANA and rheumatoid factor were trace positive.  Her ESR was 41.  Therefore we committed her to few to several week prednisone taper.  She says she is on a 5-week taper.  She is got 2 more weeks of this.  She says since her last visit she is feeling worse.  The worsening is in terms of shortness of breath.  Also has dizziness and floaters when she exerts.  In fact on a virtual rehab class she had to get on more oxygen.  Her dyspnea symptom scores are worse than before but cough and fatigue around the same.  She is requiring 3 L now with exertion.  Although in rehab the asked her to increase it to 6 L which helped but this was not documented because of worsening hypoxemia.  Today when we walked her she had to stop several times because of dizziness and fatigue although she did not desaturate.  Her lung function itself in December 2020 shows only isolated reduction in DLCO.   Of note she is alpha-1 MZ from her labs at last visit.    OV 04/29/2020   Subjective:  Patient ID: Maria [name], female , DOB: 1956/02/08, age 57 y.o. years. , MRN: [id number],  ADDRESS: [place] [address] PCP  [name], MD Providers : Treatment Team:  Attending Provider: Brand Males, MD Patient Care Team: [name], MD as PCP - General (Family Medicine)   Follow-up emphysema - alpha 1 MZ in dec 2020 Follow-up interstitial lung disease post COVID-19 in August 2020 -> feb 2021 Case conf ILD: NO ILD Follow-up chronic hypoxemic respiratory failure secondary to  both Ex smoker Echo sept 202 0 - ef 65% and diast dysfn Last CT scan of the chest February 2021  Chief Complaint  Patient presents with  . Follow-up    Pt states that she has had good days and bad days since last visit. Pt said that she is still having problems with SOB also has an occ cough.       HPI Maria [name] 65 y.o. -returns for follow-up.  Last seen by myself in February 2021.  After that she is seen nurse  practitioner.  Alpha-1 replacement was advised by nurse practitioner.  Initially denied by insurance but now apparently approved by insurance.  The still waiting for some of paperwork to be clear.  Currently on Trelegy.  She is on oxygen 3 L at rest 5 L with exertion.  She is fully vaccinated.  She is in need of Covid booster.  Overall doing well.  No flowsheet data found.        Simple office walk 185 feet x  3 laps goal with forehead probe 08/22/2019   O2 used 3L  Number laps completed Did only 2 of 3. Stopped 8 times and site down x 2. Got dizzy and floaters in eyes  Comments about pace Very slow pace  Resting Pulse Ox/HR 99% and 91/min  Final Pulse Ox/HR 99% during entire walk  Desaturated </= 88% x  Desaturated <= 3% points x  Got Tachycardic >/= 90/min yes  Symptoms at end of test As above  Miscellaneous comments Sub maximal exertion     Las\bs - alph 1 MZ in dec 2020 - autoimmune  - ANA 1:40, RF 23, and ESR 40s   PFT dec 2020  - isolated reduction in dlco 57%   PFT Results Latest Ref Rng & Units 01/16/2020 06/24/2019  FVC-Pre L 3.17 2.93  FVC-Predicted Pre % 90 83  FVC-Post L 3.25 3.39  FVC-Predicted Post % 92 96  Pre FEV1/FVC % % 55 56  Post FEV1/FCV % % 56 59  FEV1-Pre L 1.75 1.64  FEV1-Predicted Pre % 65 60  FEV1-Post L 1.83 1.99  DLCO uncorrected ml/min/mmHg 10.81 12.24  DLCO UNC% % 50 57  DLCO corrected ml/min/mmHg 11.29 -  DLCO COR %Predicted % 52 -  DLVA Predicted % 61 60  TLC L 5.25 5.81  TLC % Predicted % 98 108  RV % Predicted  % 86 110     OV 08/27/2020  Subjective:  Patient ID: Maria [name], female , DOB: 1956-07-05 , age 29 y.o. , MRN: [id number] , ADDRESS: [place] [address] PCP [name], MD Patient Care Team: [name], MD as PCP - General (Family Medicine)  This Provider for this visit: Treatment Team:  Attending Provider: Brand Males, MD    08/27/2020 -   Chief Complaint  Patient presents with  . Follow-up    Pt states she still has problems with SOB. States when the weather is colder her breathing is worse. Pt also has complaints of cough which happens all throughout the day.    Follow-up emphysema - alpha 1 MZ in dec 2020 Follow-up interstitial lung disease post COVID-19 in August 2020 -> feb 2021 Case conf ILD: NO ILD Follow-up chronic hypoxemic respiratory failure secondary to both Ex smoker 40 pack quit Echo sept 202 0 - ef 65% and diast dysfn Last CT scan of the chest February 2021  HPI Maria [name] 65 y.o. -returns to see me for the above issues.  Since last visit she has had a Covid vaccine booster.  Overall she is stable but for the last few months has had worsening cough.  The cough is dry.  She continues with oxygen 3-5 L.  We will try to get her alpha-1 replacement but she got denied by insurance.  She showed me the insurance denial letters on August 10, 2020.  Apparently there was a hearing which she was asked to participate but they sent the note is late and on the same day they  denied her appeal.  She feels this is all premeditated by the insurance company.  She is frustrated.  She continues with Trelegy and oxygen.  Review of the records indicate last CT scan of the chest was in February 2021.  She quit smoking less than 15 years ago.  She she is in need of annual CT scan now.  Other than the dry worsening cough she feels well.  There is no worsening wheezing or sputum.   CT Chest data  No results found.    PFT  PFT Results Latest Ref Rng  & Units 01/16/2020 06/24/2019  FVC-Pre L 3.17 2.93  FVC-Predicted Pre % 90 83  FVC-Post L 3.25 3.39  FVC-Predicted Post % 92 96  Pre FEV1/FVC % % 55 56  Post FEV1/FCV % % 56 59  FEV1-Pre L 1.75 1.64  FEV1-Predicted Pre % 65 60  FEV1-Post L 1.83 1.99  DLCO uncorrected ml/min/mmHg 10.81 12.24  DLCO UNC% % 50 57  DLCO corrected ml/min/mmHg 11.29 -  DLCO COR %Predicted % 52 -  DLVA Predicted % 61 60  TLC L 5.25 5.81  TLC % Predicted % 98 108  RV % Predicted % 86 110       has a past medical history of Atherosclerosis (1/0612014), Blood transfusion without reported diagnosis (2005), Cancer ([place]) (Ovarian 1978), Chest pain, COPD (chronic obstructive pulmonary disease) ([place]), Emphysema of lung ([place]) (2019), and Oxygen deficiency (Sept 2020).   reports that she quit smoking about 4 years ago. Her smoking use included cigarettes. She has a 45.00 pack-year smoking history. She has never used smokeless tobacco.  Past Surgical History:  Procedure Laterality Date  . ABDOMINAL HYSTERECTOMY  1978  . CHOLECYSTECTOMY  2010  . NECK SURGERY  1999  . OVARIAN CYST REMOVAL  1988  . SPINE SURGERY  Neck 2002    Allergies  Allergen Reactions  . Montelukast     Bad dreams    Immunization History  Administered Date(s) Administered  . Influenza, Seasonal, Injecte, Preservative Fre 04/28/2008, 04/09/2010  . Influenza,inj,Quad PF,6+ Mos 04/22/2019, 04/25/2020  . Influenza-Unspecified 06/01/2008, 05/10/2016, 03/18/2017  . Moderna Sars-Covid-2 Vaccination 07/30/2019, 08/28/2019, 06/27/2020  . Pneumococcal Polysaccharide-23 04/15/2015, 04/22/2020  . Tdap 11/17/2009  . Zoster Recombinat (Shingrix) 04/16/2020, 06/22/2020    Family History  Problem Relation Age of Onset  . Heart failure Mother   . Diabetes Mother   . Kidney disease Mother   . Hypertension Mother   . Arthritis Mother   . Heart disease Mother   . Obesity Mother   . COPD Father   . Hearing loss Father   . Lung cancer Brother    . Asthma Son   . Diabetes Son   . Birth defects Son   . COPD Brother   . COPD Brother      Current Outpatient Medications:  .  aspirin EC 81 MG tablet, Take 1 tablet (81 mg total) by mouth daily., Disp: 90 tablet, Rfl: 3 .  atorvastatin (LIPITOR) 10 MG tablet, Take 1 tablet (10 mg total) by mouth daily., Disp: 90 tablet, Rfl: 3 .  azelastine (ASTELIN) 0.1 % nasal spray, PLACE 1 SPRAY INTO BOTH NOSTRILS 2 (TWO) TIMES DAILY, Disp: 30 mL, Rfl: 1 .  Fluticasone-Umeclidin-Vilant (TRELEGY ELLIPTA) 200-62.5-25 MCG/INH AEPB, Inhale 1 puff into the lungs daily., Disp: 60 each, Rfl: 5 .  ipratropium-albuterol (DUONEB) 0.5-2.5 (3) MG/3ML SOLN, USE 1 AMPULE IN NEBULIZER 4 TIMES A DAY AS NEEDED    (Dx: J84.112, J44.9), Disp: 701  mL, Rfl: 5 .  OXYGEN, Inhale into the lungs. 2 liters at rest and 5 liters with any activity.-, Disp: , Rfl:  .  pantoprazole (PROTONIX) 20 MG tablet, TAKE 1 TABLET BY MOUTH TWICE A DAY, Disp: 180 tablet, Rfl: 1 .  Respiratory Therapy Supplies (FLUTTER) DEVI, Use as directed, Disp: 1 each, Rfl: 0 .  VENTOLIN HFA 108 (90 Base) MCG/ACT inhaler, Inhale 2 puffs into the lungs every 6 (six) hours as needed for shortness of breath., Disp: , Rfl:       Objective:   Vitals:   08/27/20 0947  BP: 130/80  Pulse: 72  Temp: 98 F (36.7 C)  TempSrc: Temporal  SpO2: 98%  Weight: 184 lb 3.2 oz (83.6 kg)  Height: '5\' 5"'  (1.651 m)    Estimated body mass index is 30.65 kg/m as calculated from the following:   Height as of this encounter: '5\' 5"'  (1.651 m).   Weight as of this encounter: 184 lb 3.2 oz (83.6 kg).  '@WEIGHTCHANGE' @  [employer]   08/27/20 0947  Weight: 184 lb 3.2 oz (83.6 kg)     Physical Exam   General: No distress. Dry cough Neuro: Alert and Oriented x 3. GCS 15. Speech normal Psych: Pleasant Resp:  Barrel Chest - no.  Wheeze - no, Crackles - no, No overt respiratory distress CVS: Normal heart sounds. Murmurs - no Ext: Stigmata of Connective Tissue  Disease - no HEENT: Normal upper airway. PEERL +. No post nasal drip        Assessment:       ICD-10-CM   1. Chronic respiratory failure with hypoxia (HCC)  J96.11   2. Chronic obstructive pulmonary disease, unspecified COPD type ([place])  J44.9   3. Alpha-1-antitrypsin deficiency carrier  Z14.8   4. Cancer screening  Z12.9   5. Stopped smoking with greater than 40 pack year history  Z87.891   6. Chronic cough  R05.3   7. Post-COVID chronic cough  R05.3    U09.9        Plan:     Patient Instructions  Chronic respiratory failure with hypoxia (HCC) Chronic obstructive pulmonary disease, unspecified COPD type (HCC) Alpha-1-antitrypsin deficiency carrier  -Currently no flareup but is having worsening cough and I question if you have COPD slowly getting worse because of the alpha-1 MZ -Too bad get insurance denied alpha-1 replacement  Plan -Continue oxygen therapy as before -Continue Trelegy inhaler -> next visit we could consider switch to BREZTRI -Check arterial blood gas -if carbon dioxide is high he might qualify for noninvasive ventilation at night -Repeat full pulmonary function test in the next few weeks to few months   Cancer screening Stopped smoking with greater than 40 pack year history Chronic cough Post-COVID chronic cough  -Unclear why he had worsening cough in the last few months.  Features do not fit in with COPD flareup  Plan - Take prednisone 40 mg daily x 2 days, then 2m daily x 2 days, then 138mdaily x 2 days, then 17m47maily x 2 days and stop -  -Get CT scan of the chest without contrast  Follow-up -Return to see Dr. RamChase Caller the next few months to review results and course          SIGNATURE    Dr. MurBrand Males.D., F.C.C.P,  Pulmonary and Critical Care Medicine Staff Physician, [facility]  [facility]  [facility]  [facility] [place], [place], [address]  Pager: [phone], If no answer or between  15:00h - 7:00h: call [phone] Telephone: [phone]  10:32 AM 08/27/2020

## 2020-08-27 NOTE — Patient Instructions (Addendum)
Chronic respiratory failure with hypoxia (HCC) Chronic obstructive pulmonary disease, unspecified COPD type (HCC) Alpha-1-antitrypsin deficiency carrier  -Currently no flareup but is having worsening cough and I question if you have COPD slowly getting worse because of the alpha-1 MZ -Too bad get insurance denied alpha-1 replacement  Plan -Continue oxygen therapy as before -Continue Trelegy inhaler -> next visit we could consider switch to BREZTRI -Check arterial blood gas -if carbon dioxide is high he might qualify for noninvasive ventilation at night -Repeat full pulmonary function test in the next few weeks to few months   Cancer screening Stopped smoking with greater than 40 pack year history Chronic cough Post-COVID chronic cough  -Unclear why he had worsening cough in the last few months.  Features do not fit in with COPD flareup  Plan - Take prednisone 40 mg daily x 2 days, then 20mg  daily x 2 days, then 10mg  daily x 2 days, then 5mg  daily x 2 days and stop -  -Get CT scan of the chest without contrast  FOllowup -Return to see [name] in the next few months to review results and course

## 2020-08-27 NOTE — Addendum Note (Signed)
Addended by: Lorretta Harp on: 08/27/2020 10:38 AM   Modules accepted: Orders

## 2020-08-31 ENCOUNTER — Ambulatory Visit (HOSPITAL_COMMUNITY)
Admission: RE | Admit: 2020-08-31 | Discharge: 2020-08-31 | Disposition: A | Discharge status: Home / Self Care | Payer: BC Managed Care – PPO | Source: Ambulatory Visit | Attending: Internal Medicine | Admitting: Internal Medicine

## 2020-08-31 ENCOUNTER — Other Ambulatory Visit: Payer: Self-pay

## 2020-08-31 DIAGNOSIS — J449 Chronic obstructive pulmonary disease, unspecified: Secondary | ICD-10-CM | POA: Insufficient documentation

## 2020-08-31 LAB — BLOOD GAS, ARTERIAL
Acid-base deficit: 0.4 mmol/L (ref 0.0–2.0)
Bicarbonate: 23.6 mmol/L (ref 20.0–28.0)
Drawn by: 20517
FIO2: 32
O2 Saturation: 96.9 %
Patient temperature: 37
pCO2 arterial: 37.3 mmHg (ref 32.0–48.0)
pH, Arterial: 7.417 (ref 7.350–7.450)
pO2, Arterial: 87.3 mmHg (ref 83.0–108.0)

## 2020-09-03 ENCOUNTER — Ambulatory Visit (HOSPITAL_COMMUNITY)
Admission: RE | Admit: 2020-09-03 | Discharge: 2020-09-03 | Disposition: A | Discharge status: Home / Self Care | Payer: BC Managed Care – PPO | Source: Ambulatory Visit | Attending: Internal Medicine | Admitting: Internal Medicine

## 2020-09-03 ENCOUNTER — Other Ambulatory Visit: Payer: Self-pay

## 2020-09-03 DIAGNOSIS — R059 Cough, unspecified: Secondary | ICD-10-CM | POA: Diagnosis present

## 2020-09-13 NOTE — Progress Notes (Signed)
Emphysema on CT. No other findings such as fibrosis or cancer or pneumonia to explain worsening cough. Pls let patinet know

## 2020-09-13 NOTE — Progress Notes (Signed)
Abg normal. Will not be calling in this normal results

## 2020-09-15 ENCOUNTER — Encounter: Payer: Self-pay | Admitting: *Deleted

## 2020-10-01 ENCOUNTER — Encounter: Payer: Self-pay | Admitting: Family Medicine

## 2020-10-01 ENCOUNTER — Other Ambulatory Visit: Payer: Self-pay

## 2020-10-01 ENCOUNTER — Ambulatory Visit (INDEPENDENT_AMBULATORY_CARE_PROVIDER_SITE_OTHER): Payer: BC Managed Care – PPO | Admitting: Family Medicine

## 2020-10-01 VITALS — BP 144/80 | HR 69 | Temp 96.9°F | Ht 65.5 in | Wt 177.8 lb

## 2020-10-01 DIAGNOSIS — Z23 Encounter for immunization: Secondary | ICD-10-CM

## 2020-10-01 DIAGNOSIS — K645 Perianal venous thrombosis: Secondary | ICD-10-CM | POA: Diagnosis not present

## 2020-10-01 DIAGNOSIS — Z0000 Encounter for general adult medical examination without abnormal findings: Secondary | ICD-10-CM | POA: Diagnosis not present

## 2020-10-01 NOTE — Assessment & Plan Note (Signed)
Suspect possible thrombosed hemorrhoid. Encouraged home care. Surgery referral if does not resolve.

## 2020-10-01 NOTE — Patient Instructions (Addendum)
Please call the location of your choice from the menu below to schedule your Mammogram and/or Bone Density appointment.    [place]  [address]. [place] at [facility]   Phone:  [phone]   [address] [place], [place] [address]                                            Services: 3D Mammogram and Bone Density  #Referral I have placed a referral to a specialist for you. You should receive a phone call from the specialty office. Make sure your voicemail is not full and that if you are able to answer your phone to unknown or new numbers.   It may take up to 2 weeks to hear about the referral. If you do not hear anything in 2 weeks, please call our office and ask to speak with the referral coordinator.

## 2020-10-01 NOTE — Telephone Encounter (Signed)
Will route to our [facility]

## 2020-10-01 NOTE — Progress Notes (Signed)
Annual Exam   Chief Complaint:  Annual exam  History of Present Illness:  [name] is a 65 y.o. No obstetric history on file. who LMP was No LMP recorded. Patient has had a hysterectomy., presents today for her annual examination.    Continues to have hip pain Worse in today Has had hip injections with limited improvement PT made her hip her worse  Nutrition She does not get adequate calcium and Vitamin D in her diet. Diet: likes greens, eats cheese, good diet in general, meat and veggies Exercise: tries to walk around the house and do housework - on oxygen  Safety The patient wears seatbelts: yes.     The patient feels safe at home and in their relationships: yes.  Dentures Due for eye doctor  Menstrual:  Occasional night sweats  GYN She is single partner, contraception - post menopausal status.    Cervical Cancer Screening (21-65):   S/p hysterectomy with cervix removed  Breast Cancer Screening (Age 24-74):  There is no FH of breast cancer. There is no FH of ovarian cancer. BRCA screening Not Indicated.  Last Mammogram: 06/12/2019 The patient does want a mammogram this year.    Colon Cancer Screening:  Age 61-75 yo - benefits outweigh the risk. Adults 63-85 yo who have never been screened benefit.  Benefits: 134000 people in 2016 will be diagnosed and 49,000 will die - early detection helps [place]: Complications 2/2 to colonoscopy High Risk (Colonoscopy): genetic disorder (Lynch syndrome or familial adenomatous polyposis), personal hx of IBD, previous adenomatous polyp, or previous colorectal cancer, FamHx start 10 years before the age at diagnosis, increased in males and black race  Options:  FIT - looks for hemoglobin (blood in the stool) - specific and fairly sensitive - must be done annually Cologuard - looks for DNA and blood - more sensitive - therefore can have more false positives, every 3 years Colonoscopy - every 10 years if normal - sedation,  bowl prep, must have someone drive you  Shared decision making and the patient had decided to do 2017.   Social History   Tobacco Use  Smoking Status Former Smoker  . Packs/day: 1.00  . Years: 45.00  . Pack years: 45.00  . Types: Cigarettes  . Quit date: 03/18/2016  . Years since quitting: 4.5  Smokeless Tobacco Never Used    Lung Cancer Screening (Ages 58-80): yes 20 year pack history? Yes Current Tobacco user? No Quit less than 15 years ago? Yes Interested in low dose CT for lung cancer screening? yes  Weight Wt Readings from Last 3 Encounters:  10/01/20 177 lb 12 oz (80.6 kg)  08/27/20 184 lb 3.2 oz (83.6 kg)  04/29/20 183 lb 3.2 oz (83.1 kg)   Patient has high BMI  BMI Readings from Last 1 Encounters:  10/01/20 29.13 kg/m     Chronic disease screening Blood pressure monitoring:  BP Readings from Last 3 Encounters:  10/01/20 (!) 144/80  08/27/20 130/80  04/29/20 120/70    Lipid Monitoring: Indication for screening: age >47, obesity, diabetes, family hx, CV risk factors.  Lipid screening: Yes  Lab Results  Component Value Date   CHOL 143 04/02/2020   HDL 47.50 04/02/2020   LDLCALC 77 04/02/2020   TRIG 95.0 04/02/2020   CHOLHDL 3 04/02/2020     Diabetes Screening: age >32, overweight, family hx, PCOS, hx of gestational diabetes, at risk ethnicity Diabetes Screening screening: Yes  Lab Results  Component Value Date  HGBA1C 6.2 08/29/2019     Past Medical History:  Diagnosis Date  . Atherosclerosis 1/0612014   aorta, iliacs and CFA bilaterally no greater than 0-49% - lower arterial doppler.  . Blood transfusion without reported diagnosis 2005  . Cancer ([place]) Ovarian 1978  . Chest pain   . COPD (chronic obstructive pulmonary disease) ([place])   . Emphysema of lung ([place]) 2019  . Oxygen deficiency Sept 2020    Past Surgical History:  Procedure Laterality Date  . ABDOMINAL HYSTERECTOMY  1978  . CHOLECYSTECTOMY  2010  . NECK SURGERY  1999  .  OVARIAN CYST REMOVAL  1988  . SPINE SURGERY  Neck 2002    Prior to Admission medications   Medication Sig Start Date End Date Taking? Authorizing Provider  aspirin EC 81 MG tablet Take 1 tablet (81 mg total) by mouth daily. 09/30/19  Yes [name], MD  atorvastatin (LIPITOR) 10 MG tablet Take 1 tablet (10 mg total) by mouth daily. 09/30/19  Yes [name], MD  azelastine (ASTELIN) 0.1 % nasal spray PLACE 1 SPRAY INTO BOTH NOSTRILS 2 (TWO) TIMES DAILY 04/10/20  Yes Brand Males, MD  Fluticasone-Umeclidin-Vilant (TRELEGY ELLIPTA) 200-62.5-25 MCG/INH AEPB Inhale 1 puff into the lungs daily. 02/05/20  Yes Martyn Ehrich, NP  ipratropium-albuterol (DUONEB) 0.5-2.5 (3) MG/3ML SOLN USE 1 AMPULE IN NEBULIZER 4 TIMES A DAY AS NEEDED    (Dx: U98.119, J44.9) 09/12/19  Yes Brand Males, MD  OXYGEN Inhale into the lungs. 3 liters at rest and 5 liters with any activity.-   Yes [provider]  pantoprazole (PROTONIX) 20 MG tablet TAKE 1 TABLET BY MOUTH TWICE A DAY 04/28/20  Yes Brand Males, MD  Respiratory Therapy Supplies (FLUTTER) DEVI Use as directed 04/22/19  Yes Martyn Ehrich, NP  VENTOLIN HFA 108 (90 Base) MCG/ACT inhaler Inhale 2 puffs into the lungs every 6 (six) hours as needed for shortness of breath. 02/23/19  Yes [provider]    Allergies  Allergen Reactions  . Montelukast     Bad dreams    Gynecologic History: No LMP recorded. Patient has had a hysterectomy.  Obstetric History: No obstetric history on file.  Social History   Socioeconomic History  . Marital status: Married    Spouse name: Maria Reading ([place])  . Number of children: 3  . Years of education: Some college  . Highest education level: Not on file  Occupational History  . Not on file  Tobacco Use  . Smoking status: Former Smoker    Packs/day: 1.00    Years: 45.00    Pack years: 45.00    Types: Cigarettes    Quit date: 03/18/2016    Years since quitting: 4.5  . Smokeless  tobacco: Never Used  Vaping Use  . Vaping Use: Never used  Substance and Sexual Activity  . Alcohol use: No    Alcohol/week: 0.0 standard drinks  . Drug use: No  . Sexual activity: Not Currently  Other Topics Concern  . 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      Family: Daughter Lynett Fish nearby, son Barbaraann Rondo in [place], and other daughter - 1 hour; 4 grandchildren      Enjoys: crochet, relax at home      Exercise: on hold but has been doing pulmonary rehab  Diet: not currently, but has lost weight      Safety   Seat belts: Yes    Guns: Yes  and secure   Safe in relationships: Yes    Social Determinants of Health   Financial Resource Strain: Not on file  Food Insecurity: Not on file  Transportation Needs: Not on file  Physical Activity: Not on file  Stress: Not on file  Social Connections: Not on file  Intimate Partner Violence: Not on file    Family History  Problem Relation Age of Onset  . Heart failure Mother   . Diabetes Mother   . Kidney disease Mother   . Hypertension Mother   . Arthritis Mother   . Heart disease Mother   . Obesity Mother   . COPD Father   . Hearing loss Father   . Lung cancer Brother   . Asthma Son   . Diabetes Son   . Birth defects Son   . COPD Brother   . COPD Brother     Review of Systems  Constitutional: Negative for chills and fever.  HENT: Negative for congestion and sore throat.   Eyes: Negative for blurred vision and double vision.  Respiratory: Negative for shortness of breath.   Cardiovascular: Negative for chest pain.  Gastrointestinal: Negative for heartburn, nausea and vomiting.       Rectal pain  Genitourinary: Negative.   Musculoskeletal: Negative.  Negative for myalgias.  Skin: Negative for rash.  Neurological: Negative for dizziness and headaches.   Endo/Heme/Allergies: Does not bruise/bleed easily.  Psychiatric/Behavioral: Negative for depression. The patient is not nervous/anxious.      Physical Exam BP (!) 144/80 (BP Location: Left Arm, Patient Position: Sitting, Cuff Size: Normal)   Pulse 69   Temp (!) 96.9 F (36.1 C) (Temporal)   Ht 5' 5.5" (1.664 m)   Wt 177 lb 12 oz (80.6 kg)   SpO2 99%   BMI 29.13 kg/m    BP Readings from Last 3 Encounters:  10/01/20 (!) 144/80  08/27/20 130/80  04/29/20 120/70      Physical Exam Exam conducted with a chaperone present.  Constitutional:      General: She is not in acute distress.    Appearance: She is well-developed. She is not diaphoretic.  HENT:     Head: Normocephalic and atraumatic.     Right Ear: External ear normal.     Left Ear: External ear normal.     Nose: Nose normal.  Eyes:     General: No scleral icterus.    Conjunctiva/sclera: Conjunctivae normal.  Cardiovascular:     Rate and Rhythm: Normal rate and regular rhythm.     Heart sounds: No murmur heard.   Pulmonary:     Effort: Pulmonary effort is normal. No respiratory distress.     Breath sounds: No decreased air movement. No wheezing.     Comments: On O2, frequent coughing with deep breathing Abdominal:     General: Bowel sounds are normal. There is no distension.     Palpations: Abdomen is soft. There is no mass.     Tenderness: There is no abdominal tenderness. There is no guarding or rebound.  Genitourinary:    Rectum: External hemorrhoid (?thrombosed) present.  Musculoskeletal:        General: Normal range of motion.     Cervical back: Neck supple.  Lymphadenopathy:     Cervical: No cervical adenopathy.  Skin:    General: Skin is warm and dry.  Capillary Refill: Capillary refill takes less than 2 seconds.  Neurological:     Mental Status: She is alert and oriented to person, place, and time.     Deep Tendon Reflexes: Reflexes normal.  Psychiatric:        Behavior: Behavior normal.      Results:  PHQ-9:  [place] Office Visit from 10/01/2020 in [facility] at [place]  PHQ-9 Total Score 2        Assessment: 65 y.o. No obstetric history on file. female here for routine annual physical examination.  Plan: Problem List Items Addressed This Visit      Cardiovascular and Mediastinum   Thrombosed hemorrhoids    Suspect possible thrombosed hemorrhoid. Encouraged home care. Surgery referral if does not resolve.       Relevant Orders   Ambulatory referral to General Surgery    Other Visit Diagnoses    Annual physical exam    -  Primary   Need for Td vaccine       Relevant Orders   Td : Tetanus/diphtheria >7yo Preservative  free (Completed)      Screening: -- Blood pressure screen elevated: continued to monitor. -- cholesterol screening: not due for screening -- Weight screening: normal -- Diabetes Screening: not due for screening -- Nutrition: Encouraged healthy diet  The 10-year ASCVD risk score Mikey Bussing DC Jr., et al., 2013) is: 4.9%   Values used to calculate the score:     Age: 15 years     Sex: Female     Is Non-Hispanic African American: No     Diabetic: No     Tobacco smoker: No     Systolic Blood Pressure: 127 mmHg     Is BP treated: No     HDL Cholesterol: 47.5 mg/dL     Total Cholesterol: 143 mg/dL  -- Statin therapy for Age 6-75 with CVD risk >7.5%  Psych -- Depression screening (PHQ-9):  [place] Office Visit from 10/01/2020 in [facility] [place]  PHQ-9 Total Score 2       Safety -- tobacco screening: not using -- alcohol screening:  low-risk usage. -- no evidence of domestic violence or intimate partner violence.   Cancer Screening -- pap smear not collected per ASCCP guidelines -- family history of breast cancer screening: done. not at high risk. -- Mammogram - encouraged getting - she will call -- Colon cancer (age 53+)-- up to date  Immunizations Immunization History   Administered Date(s) Administered  . Influenza, Seasonal, Injecte, Preservative Fre 04/28/2008, 04/09/2010  . Influenza,inj,Quad PF,6+ Mos 04/22/2019, 04/25/2020  . Influenza-Unspecified 06/01/2008, 05/10/2016, 03/18/2017  . Moderna Sars-Covid-2 Vaccination 07/30/2019, 08/28/2019, 06/27/2020  . Pneumococcal Polysaccharide-23 04/15/2015, 04/22/2020  . Td 10/01/2020  . Tdap 11/17/2009  . Zoster Recombinat (Shingrix) 04/16/2020, 06/22/2020    -- flu vaccine up to date -- TDAP q10 years due -- Shingles (age >55) up to date -- PPSV-23 (19-64 with chronic disease or smoking) up to date -- Covid-19 Vaccine up to date   Encouraged healthy diet and exercise. Encouraged regular vision and dental care.    [name], MD

## 2020-10-06 ENCOUNTER — Other Ambulatory Visit: Payer: Self-pay | Admitting: Internal Medicine

## 2020-10-08 ENCOUNTER — Ambulatory Visit: Payer: Self-pay | Admitting: Surgery

## 2020-10-13 ENCOUNTER — Encounter: Payer: Self-pay | Admitting: Surgery

## 2020-10-13 ENCOUNTER — Ambulatory Visit (INDEPENDENT_AMBULATORY_CARE_PROVIDER_SITE_OTHER): Payer: BC Managed Care – PPO | Admitting: Surgery

## 2020-10-13 ENCOUNTER — Other Ambulatory Visit: Payer: Self-pay

## 2020-10-13 VITALS — BP 158/80 | HR 82 | Temp 98.5°F | Ht 65.5 in | Wt 175.0 lb

## 2020-10-13 DIAGNOSIS — K645 Perianal venous thrombosis: Secondary | ICD-10-CM

## 2020-10-13 MED ORDER — HYDROCODONE-ACETAMINOPHEN 10-325 MG PO TABS: 1.0000 | ORAL_TABLET | Freq: Four times a day (QID) | ORAL | 0 refills | Status: DC | PRN

## 2020-10-13 NOTE — Patient Instructions (Addendum)
You may keep gauze on the area and change this several times a day.   You will have some bleeding for a couple of days especially after bowel movements. Use a sitz bath after every bowel movement and also several times a day for comfort and cleaning.   You may take 800 mg Ibuprofen(4 over the counter tabs) every 8 hours. You may rotate this with 2 extra strength Tylenol every 8 hours. So you are taking something every 4 hours.   Call us if this is not enough for pain control.   You want to take a fiber supplement every day. You may take twice a day if needed. You want to have at least 1-2 good soft bowel movements a day.  Follow up here in 2 weeks.

## 2020-10-13 NOTE — Progress Notes (Signed)
Patient ID: Maria [name], female   DOB: 1955-09-21, 65 y.o.   MRN: [id number]  Chief Complaint: Hemorrhoids.  History of Present Illness Maria [name] is a 65 y.o. female with her initial history beginning many years ago.  Most recent issues began in January.  She states she has no known history of thrombosis.  She denies blood in her stools.  Her last colonoscopy was 5 years ago.  She reports primarily pain and burning, denying any itching.  She utilizes sitz bath and cold packs and Tucks wipes and Preparation H.  She reports her bowel movements are about every other day, and quite uncomfortable.  Past Medical History Past Medical History:  Diagnosis Date  . Atherosclerosis 1/0612014   aorta, iliacs and CFA bilaterally no greater than 0-49% - lower arterial doppler.  . Blood transfusion without reported diagnosis 2005  . Cancer ([place]) Ovarian 1978  . Chest pain   . COPD (chronic obstructive pulmonary disease) ([place])   . Emphysema of lung ([place]) 2019  . Oxygen deficiency Sept 2020      Past Surgical History:  Procedure Laterality Date  . ABDOMINAL HYSTERECTOMY  1978  . CHOLECYSTECTOMY  2010  . NECK SURGERY  1999  . OVARIAN CYST REMOVAL  1988  . SPINE SURGERY  Neck 2002    Allergies  Allergen Reactions  . Montelukast     Bad dreams    Current Outpatient Medications  Medication Sig Dispense Refill  . aspirin EC 81 MG tablet Take 1 tablet (81 mg total) by mouth daily. 90 tablet 3  . atorvastatin (LIPITOR) 10 MG tablet Take 1 tablet (10 mg total) by mouth daily. 90 tablet 3  . azelastine (ASTELIN) 0.1 % nasal spray PLACE 1 SPRAY INTO BOTH NOSTRILS 2 (TWO) TIMES DAILY 30 mL 1  . Fluticasone-Umeclidin-Vilant (TRELEGY ELLIPTA) 200-62.5-25 MCG/INH AEPB Inhale 1 puff into the lungs daily. 60 each 5  . ipratropium-albuterol (DUONEB) 0.5-2.5 (3) MG/3ML SOLN USE 1 AMPULE IN NEBULIZER 4 TIMES A DAY AS NEEDED    (Dx: J84.112, J44.9) 360 mL 5  . OXYGEN Inhale into the lungs. 3 liters at rest  and 5 liters with any activity.-    . pantoprazole (PROTONIX) 20 MG tablet TAKE 1 TABLET BY MOUTH TWICE A DAY 180 tablet 1  . Respiratory Therapy Supplies (FLUTTER) DEVI Use as directed 1 each 0  . VENTOLIN HFA 108 (90 Base) MCG/ACT inhaler Inhale 2 puffs into the lungs every 6 (six) hours as needed for shortness of breath.     No current facility-administered medications for this visit.    Family History Family History  Problem Relation Age of Onset  . Heart failure Mother   . Diabetes Mother   . Kidney disease Mother   . Hypertension Mother   . Arthritis Mother   . Heart disease Mother   . Obesity Mother   . COPD Father   . Hearing loss Father   . Lung cancer Brother   . Asthma Son   . Diabetes Son   . Birth defects Son   . COPD Brother   . COPD Brother       Social History Social History   Tobacco Use  . Smoking status: Former Smoker    Packs/day: 1.00    Years: 45.00    Pack years: 45.00    Types: Cigarettes    Quit date: 03/18/2016    Years since quitting: 4.5  . Smokeless tobacco: Never Used  Vaping Use  .  Vaping Use: Never used  Substance Use Topics  . Alcohol use: No    Alcohol/week: 0.0 standard drinks  . Drug use: No        Review of Systems  Constitutional: Positive for malaise/fatigue.  HENT: Negative.   Eyes: Negative.   Respiratory: Positive for cough, shortness of breath (O2 dependent, 3 L at rest, 5 L with activity) and wheezing.   Cardiovascular: Positive for orthopnea.  Gastrointestinal: Positive for constipation. Negative for abdominal pain, blood in stool, diarrhea and melena.  Genitourinary: Negative.   Skin: Negative.   Neurological: Negative.   Psychiatric/Behavioral: Negative.       Physical Exam Blood pressure (!) 158/80, pulse 82, temperature 98.5 F (36.9 C), height 5' 5.5" (1.664 m), weight 175 lb (79.4 kg), SpO2 96 %. Last Weight  Most recent update: 10/13/2020 10:46 AM   Weight  79.4 kg (175 lb)             CONSTITUTIONAL: Well developed, and nourished, appropriately responsive and aware without distress.   EYES: Sclera non-icteric.   EARS, NOSE, MOUTH AND THROAT: Mask worn.  Nasal cannula for O2.  Hearing is intact to voice.  NECK: Trachea is midline, and there is no jugular venous distension.  LYMPH NODES:  Lymph nodes in the neck are not enlarged. RESPIRATORY:  Normal respiratory effort without pathologic use of accessory muscles. CARDIOVASCULAR: Heart is regular in rate and rhythm. GI: The abdomen is  soft, nontender, and nondistended. There were no palpable masses. I did not appreciate hepatosplenomegaly.  GU: DRE: There is a large 3 cm external hemorrhoid, which is not acutely thrombosed but has evidence of being so in the not so distant past.  There is minimal internal hemorrhoidal tissue associated with this pile, located on the patient's left about 9-10 o'clock.  There is no other appreciable external or internal hemorrhoids present.  No evidence of fissure present. MUSCULOSKELETAL:  Symmetrical muscle tone appreciated in all four extremities.    SKIN: Skin turgor is normal. No pathologic skin lesions appreciated.  NEUROLOGIC:  Motor and sensation appear grossly normal.  Cranial nerves are grossly without defect. PSYCH:  Alert and oriented to person, place and time. Affect is appropriate for situation.  Data Reviewed I have personally reviewed what is currently available of the patient's imaging, recent labs and medical records.   Labs:  CBC Latest Ref Rng & Units 11/18/2019 08/22/2019 03/19/2019  WBC 4.0 - 10.5 K/uL 11.8(H) 12.3(H) 11.9(H)  Hemoglobin 12.0 - 15.0 g/dL 12.1 13.6 13.3  Hematocrit 36.0 - 46.0 % 36.2 39.3 40.0  Platelets 150.0 - 400.0 K/uL 361.0 381 306.0   CMP Latest Ref Rng & Units 08/22/2019 08/09/2019 03/19/2019  Glucose 70 - 99 mg/dL 116(H) 88 80  BUN 6 - 23 mg/dL 13 18 17   Creatinine 0.40 - 1.20 mg/dL 0.73 0.72 0.80  Sodium 135 - 145 mEq/L 140 141 138  Potassium 3.5 -  5.1 mEq/L 4.3 3.6 3.7  Chloride 96 - 112 mEq/L 103 104 99  CO2 19 - 32 mEq/L 28 26 28   Calcium 8.4 - 10.5 mg/dL 9.7 9.9 9.3  Total Protein 6.0 - 8.3 g/dL - - 6.9  Total Bilirubin 0.2 - 1.2 mg/dL - - 0.5  Alkaline Phos 39 - 117 U/L - - 73  AST 0 - 37 U/L - - 14  ALT 0 - 35 U/L - - 20      Imaging:  Within last 24 hrs: No results found.  Assessment  Large external left-sided hemorrhoid.  Minimal internal hemorrhoidal disease. Patient Active Problem List   Diagnosis Date Noted  . Thrombosed hemorrhoids 10/01/2020  . Prediabetes 09/30/2019  . Tear of left gluteus minimus tendon 09/30/2019  . Aortic atherosclerosis ([place]) 09/30/2019  . Emphysema due to alpha-1-antitrypsin deficiency ([place]) 09/16/2019  . Cheilitis 08/27/2019  . Other microscopic hematuria 08/27/2019  . Bronchiectasis without complication ([place]) 80/99/8338  . Chronic respiratory failure with hypoxia ([place]) 04/09/2019  . Pneumonia due to COVID-19 virus 02/27/2019  . COVID-19 virus infection 02/26/2019  . Stage 2 moderate COPD by GOLD classification ([place]) 02/26/2019  . Syncope 02/26/2019  . Orthostatic hypotension 02/26/2019    Plan    We discussed excision under local anesthetic, versus purely medical management and deferral of procedure.  She desires to proceed with excision today.  Risks reviewed.  Questions answered and risks accepted.  Procedure: External hemorrhoidectomy, limited. The patient continuing in a kneeling position, we prepped her left perianal area with ChloraPrep, locally anesthetized the base of the large external hemorrhoid with 1% lidocaine with epinephrine.  With adequate anesthesia, I was able to place a Kelly clamp across the base of the hemorrhoid without difficulty, and without evidence of pain.  The left the clamp in place for at least 3 minutes, I excised the hemorrhoid off of the clamp.  Eventually remove the clamp, with no evidence of bleeding.  This was clearly hemorrhoidal tissue and  discarded.  She tolerated this procedure well.  Advised to pursue a goal of 25 to 30 g of fiber daily.  Made aware that the majority of this may be through natural sources, but advised to be aware of actual consumption and to ensure minimal consumption by daily supplementation.  Various forms of supplements discussed.  Strongly advised to consume more fluids to ensure adequate hydration, instructed to watch color of urine to determine adequacy of hydration.  Clarity is pursued in urine output, and bowel activity that correlates to significant meal intake.  Patient is to avoid deferring having bowel movements, advised to take the time at the first sign of sensation, typically following meals and in the morning.  Subsequent utilization of MiraLAX to ensure at least daily movement, ideally twice daily bowel movements.  If multiple doses of MiraLAX are necessary utilize them.  She is to utilize sitz bath's 3 times daily and as needed bowel movements.  She will follow up in 2 weeks or as needed.  Instructions given.  Face-to-face time spent with the patient and accompanying care providers(if present) was 40 minutes, with more than 50% of the time spent counseling, educating, and coordinating care of the patient.      Ronny Bacon M.D., FACS 10/13/2020, 11:55 AM

## 2020-10-15 ENCOUNTER — Other Ambulatory Visit: Payer: Self-pay | Admitting: Family Medicine

## 2020-10-15 DIAGNOSIS — I700 Atherosclerosis of aorta: Secondary | ICD-10-CM

## 2020-10-23 ENCOUNTER — Other Ambulatory Visit (HOSPITAL_COMMUNITY): Payer: BC Managed Care – PPO

## 2020-10-26 ENCOUNTER — Encounter: Payer: BC Managed Care – PPO | Admitting: Internal Medicine

## 2020-10-26 ENCOUNTER — Ambulatory Visit: Payer: BC Managed Care – PPO | Admitting: Internal Medicine

## 2020-10-27 ENCOUNTER — Ambulatory Visit: Payer: BC Managed Care – PPO | Admitting: Surgery

## 2020-10-29 ENCOUNTER — Ambulatory Visit (INDEPENDENT_AMBULATORY_CARE_PROVIDER_SITE_OTHER): Payer: BC Managed Care – PPO | Admitting: Surgery

## 2020-10-29 ENCOUNTER — Other Ambulatory Visit: Payer: Self-pay

## 2020-10-29 ENCOUNTER — Encounter: Payer: Self-pay | Admitting: Surgery

## 2020-10-29 VITALS — BP 97/67 | HR 76 | Temp 98.2°F | Ht 65.0 in | Wt 177.0 lb

## 2020-10-29 DIAGNOSIS — K645 Perianal venous thrombosis: Secondary | ICD-10-CM

## 2020-10-29 NOTE — Progress Notes (Signed)
[facility] POST-OP OFFICE VISIT  10/29/2020  HPI: Maria [name] is a 65 y.o. female 16 days s/p in office excision of thrombosed external hemorrhoid. She reports she still has some pain in the region, mostly with bowel movements and wiping.  But otherwise doing well.  She reports her bowel movements are regular with her fiber supplementation.  She denies any bleeding.  Vital signs: BP 97/67   Pulse 76   Temp 98.2 F (36.8 C) (Oral)   Ht 5\' 5"  (1.651 m)   Wt 177 lb (80.3 kg)   SpO2 97%   BMI 29.45 kg/m    Physical Exam: Constitutional: She appears well. Perianal skin appears without evidence of external hemorrhoids.  Prior excision site has a small granulated area that has not completely covered with epidermis.  No erythema, no induration, no fluctuance.  Minimally tender Skin: Not fully healed at the thrombosed hemorrhoid excision site, but for all intents and purposes practically there.  Assessment/Plan: This is a 65 y.o. female 16 days s/p excision of external thrombosed hemorrhoid on patient's left.  Patient Active Problem List   Diagnosis Date Noted  . Thrombosed external hemorrhoids 10/01/2020  . Prediabetes 09/30/2019  . Tear of left gluteus minimus tendon 09/30/2019  . Aortic atherosclerosis ([place]) 09/30/2019  . Emphysema due to alpha-1-antitrypsin deficiency ([place]) 09/16/2019  . Cheilitis 08/27/2019  . Other microscopic hematuria 08/27/2019  . Bronchiectasis without complication ([place]) 29/92/4268  . Chronic respiratory failure with hypoxia ([place]) 04/09/2019  . Pneumonia due to COVID-19 virus 02/27/2019  . COVID-19 virus infection 02/26/2019  . Stage 2 moderate COPD by GOLD classification ([place]) 02/26/2019  . Syncope 02/26/2019  . Orthostatic hypotension 02/26/2019    -She appears to be progressing nicely, I do not believe there is further need for her to follow-up for further wound evaluation as I anticipate to complete healing over the next few  days/week.  I will plan to see her again as needed.   Ronny Bacon M.D., FACS 10/29/2020, 5:15 PM

## 2020-10-29 NOTE — Patient Instructions (Signed)
The discomfort will improve over the next two weeks. Continue to keep your stools soft.  Follow-up with our office as needed.  Please call and ask to speak with a nurse if you develop questions or concerns.

## 2020-11-02 ENCOUNTER — Other Ambulatory Visit: Payer: Self-pay | Admitting: Primary Care

## 2020-11-05 ENCOUNTER — Telehealth: Payer: Self-pay | Admitting: Internal Medicine

## 2020-11-05 NOTE — Telephone Encounter (Signed)
Called and spoke with Joe calling to f/u about [place]. Informed Joe about appeal letter sent in 07/2020 and we still have not heard back. Joe stated if we have not heard back in 7-10 days, it was most likely denied again. Calling to see if [employer] wants them to close this case or continue to appeal. Pt has inquired about this as well. Please advise [phone].   Derl Barrow, please advise

## 2020-11-06 NOTE — Telephone Encounter (Signed)
We can close the case.  Would recommend patient follow-up with MR first available with him

## 2020-11-06 NOTE — Telephone Encounter (Signed)
Pt has OV scheduled with MR 4/29 having PFT prior 4/28. Called and spoke with Joe letting him know that [place] said that the case can be closed and he verbalized understanding. Nothing further needed.

## 2020-11-10 ENCOUNTER — Other Ambulatory Visit (HOSPITAL_COMMUNITY)
Admission: RE | Admit: 2020-11-10 | Discharge: 2020-11-10 | Disposition: A | Discharge status: Home / Self Care | Payer: BC Managed Care – PPO | Source: Ambulatory Visit | Attending: Internal Medicine | Admitting: Internal Medicine

## 2020-11-10 DIAGNOSIS — Z01812 Encounter for preprocedural laboratory examination: Secondary | ICD-10-CM | POA: Insufficient documentation

## 2020-11-10 DIAGNOSIS — Z20822 Contact with and (suspected) exposure to covid-19: Secondary | ICD-10-CM | POA: Diagnosis not present

## 2020-11-10 LAB — SARS CORONAVIRUS 2 (TAT 6-24 HRS): SARS Coronavirus 2: NEGATIVE

## 2020-11-12 ENCOUNTER — Other Ambulatory Visit: Payer: Self-pay

## 2020-11-12 ENCOUNTER — Ambulatory Visit (INDEPENDENT_AMBULATORY_CARE_PROVIDER_SITE_OTHER): Payer: BC Managed Care – PPO | Admitting: Internal Medicine

## 2020-11-12 DIAGNOSIS — J449 Chronic obstructive pulmonary disease, unspecified: Secondary | ICD-10-CM

## 2020-11-12 LAB — PULMONARY FUNCTION TEST
DL/VA % pred: 57 %
DL/VA: 2.36 ml/min/mmHg/L
DLCO cor % pred: 37 %
DLCO cor: 8.06 ml/min/mmHg
DLCO unc % pred: 37 %
DLCO unc: 8.06 ml/min/mmHg
FEF 25-75 Post: 0.92 L/sec
FEF 25-75 Pre: 0.68 L/sec
FEF2575-%Change-Post: 35 %
FEF2575-%Pred-Post: 39 %
FEF2575-%Pred-Pre: 29 %
FEV1-%Change-Post: 7 %
FEV1-%Pred-Post: 64 %
FEV1-%Pred-Pre: 59 %
FEV1-Post: 1.71 L
FEV1-Pre: 1.6 L
FEV1FVC-%Change-Post: 0 %
FEV1FVC-%Pred-Pre: 70 %
FEV6-%Change-Post: 4 %
FEV6-%Pred-Post: 91 %
FEV6-%Pred-Pre: 86 %
FEV6-Post: 3.04 L
FEV6-Pre: 2.9 L
FEV6FVC-%Change-Post: -3 %
FEV6FVC-%Pred-Post: 99 %
FEV6FVC-%Pred-Pre: 102 %
FVC-%Change-Post: 8 %
FVC-%Pred-Post: 91 %
FVC-%Pred-Pre: 84 %
FVC-Post: 3.18 L
FVC-Pre: 2.94 L
Post FEV1/FVC ratio: 54 %
Post FEV6/FVC ratio: 96 %
Pre FEV1/FVC ratio: 54 %
Pre FEV6/FVC Ratio: 99 %
RV % pred: 88 %
RV: 1.91 L
TLC % pred: 91 %
TLC: 4.89 L

## 2020-11-12 NOTE — Progress Notes (Signed)
PFT done today. 

## 2020-11-13 ENCOUNTER — Encounter: Payer: Self-pay | Admitting: Internal Medicine

## 2020-11-13 ENCOUNTER — Ambulatory Visit: Payer: BC Managed Care – PPO | Admitting: Internal Medicine

## 2020-11-13 VITALS — BP 118/80 | HR 68 | Temp 97.4°F | Ht 66.0 in | Wt 176.0 lb

## 2020-11-13 DIAGNOSIS — Z148 Genetic carrier of other disease: Secondary | ICD-10-CM

## 2020-11-13 DIAGNOSIS — J449 Chronic obstructive pulmonary disease, unspecified: Secondary | ICD-10-CM

## 2020-11-13 DIAGNOSIS — R053 Chronic cough: Secondary | ICD-10-CM | POA: Diagnosis not present

## 2020-11-13 DIAGNOSIS — J9611 Chronic respiratory failure with hypoxia: Secondary | ICD-10-CM | POA: Diagnosis not present

## 2020-11-13 MED ORDER — GABAPENTIN 300 MG PO CAPS: ORAL_CAPSULE | ORAL | 5 refills | Status: DC

## 2020-11-13 MED ORDER — BREZTRI AEROSPHERE 160-9-4.8 MCG/ACT IN AERO: 2.0000 | INHALATION_SPRAY | Freq: Two times a day (BID) | RESPIRATORY_TRACT | 0 refills | Status: DC

## 2020-11-13 NOTE — Addendum Note (Signed)
Addended by: Lorretta Harp on: 11/13/2020 12:37 PM   Modules accepted: Orders

## 2020-11-13 NOTE — Patient Instructions (Addendum)
Chronic respiratory failure with hypoxia (HCC) Chronic obstructive pulmonary disease, unspecified COPD type ([place]) Alpha-1-antitrypsin deficiency carrier Stopped smoking with greater than 40 pack year history Chronic cough Post-COVID chronic cough  -Currently no flareup but is having worsening cough since Jan 2022 even before increase n trelegy dose - Prednisone burst feb 2022 did not help - CT chest 2022 only has emphysema - ABG without hypercapnia - PFT stble since 2020 but diffusion slightly worse - could be technical issue or slight progression in emphysema  - this change makes pepple more short of breath instead of more cough  - No BP med or fish oil to explain worsening cough  - Cough worsening could be DPI Trelegy or cough neuropathy in excess of baseline copd -Too bad get insurance denied alpha-1 replacement  Plan -Continue oxygen therapy as before (3 to 5L ) - switch to BREZTRI 2 puff twice dailyt - use albuterol as needed  - refer voice rehab  - STart/Take gabapentin 300mg  once daily x 7 days, then 300mg  twice daily to continue. If this makes you too sleepy or drowsy call us and we will cut your medication dosing down   FOllowup -4-8 weeks with Tammy Parrett to dicuss cough  - RSI cough score at followup

## 2020-11-13 NOTE — Progress Notes (Signed)
2020  Subjective:  Patient ID: Maria [name], female , DOB: 1955/12/10 , age 65 y.o. , MRN: [id number] , ADDRESS: [place] [address]   03/19/2019 -   Chief Complaint  Patient presents with  . Hospitalization Follow-up    Post covid symptoms   Post COVID  follow-up in the pulmonary office  HPI Maria [name] 65 y.o. 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  And also poor intake.  Was diagnosed to have acute COVID-19.  She remembers being on oxygen.  At the time prior to admission she is had chronic hip pain based on her history and review of the records for which she was using a cane.  She spent a total of 5 days at [facility] [place] [facility] and was discharged on March 03, 2019.  She was treated with oxygen, Solu-Medrol and REM does Advair.  Last set of laboratories reviewed on March 03, 2019 which showed that she had normal hemoglobin and creatinine and his CRP had improved.  The only chest x-ray that was available is 1 on admission in February 26, 2019 but she has some basal infiltrates.  She now presents to the pulmonary clinic on March 19, 2019.  It is 21 days since admission and she is over 21 days since her first set of symptoms.  She tells me that she is a lot more fatigue.  She is so fatigued that she is not even able to take her inhaler properly which is Trelegy for her COPD.  She feels short of breath all the time.  She is requiring a lot of help taking a shower.  Her hip pain continues.  She is now needing the use of a walker.  Her ECOG is 4.  Her appetite is good.  She is currently not using oxygen either at night or in the daytime.   Results for Maria [name]" (MRN [id number])  as of 08/22/2019 12:57  Ref. Range 03/01/2019 03:22 03/02/2019 01:55 03/03/2019 01:54  D-Dimer, America Brown Latest Ref Range: 0.00 - 0.50 ug/mL-FEU 1.34 (H) 1.12 (H) 1.28 (H)       04/03/2019 Presents today for a 2-week follow-up.  Echocardiogram showed normal systolic function with estimated EF 60 to 65%.  Left ventricle diastolic doppler consistent with impaired relaxation-determinant filling pressures.  Right ventricle with normal systolic function, increased wall thickness-systolic pressure could not be assessed.  HRCT is scheduled for 9/29.  Patient called on 9/4 asking about portable oxygen tank order.  Reports that her O2 level has read 84 to 85% after exertion with a heart rate of 110.  She reports that her oxygen level recovers greater than 90% after resting.  Is not currently on oxygen at home will need to be walked in office to qualify for POC. Repeat COVID testing was positive, patient reports that she still has lost her taste and smell with increased shortness of breath.   She is feeling a little better today. Family is checking on her. No other help at home. Reports feeling fine when sitting but her O2 drops as low as 85% on exertion. She is able to recover with rest. Taking prednisone taper as prescribed, feels it has helped her chest tightness. She has  dry/hacking cough with no mucus production. She is eating and drinking ok. Denies fever.   04/09/2019 Patient presents today for 1 week follow-up. Unfortunately we were unable to get her set up with home health d/t her insurance. She is here today for qualifying O2 walk. She continues to have some shortness of breath and dry cough. Low energy. Her taste and smell as returned some. She is drinking plenty of fluids. Reports that her cough is not quite as bad, states the more she talks she has to cough. Has trouble sleeping at night d/t her respiratory symptoms. Feels delsym and tessalon perles have been helpful. ONO on RA showed O2 low 70% with  baseline 87%. Ambulatory O2 today showed O2 desaturation 84% on RA, requiring 3L. Using [employer] for nebulizer supplies. Denies fever, weight loss, hemoptysis, purulent mucus.   04/22/2019 Patient presents today for 2 week follow-up. Breathing is the same. Continues to have a dry cough. Completed prednisone 2 days ago. Using incentive spirometry. She has tried to go without her oxygen at home but states that she gets too short winded. 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 Diffuse bronchial wall thickening with moderate centrilobular and paraseptal emphysema; imaging findings suggestive of underlying COPD. She had COPD symptoms before getting COVID. Her breathing was getting a little worse before her diagnosis.  Sleeping better at night with oxygen. Ambulating with rolling walker, still deconditioned/weak. Due for influenza vaccine today.   Oxygen readings:  Resting O2 93% on RA  Ambulatory O2 98-100 on 3L Ambulatory O2 85% on RA  OV 07/03/2019  Subjective:  Patient ID: Maria [name], female , DOB: 1955-12-05 , age 80 y.o. , MRN: [id number] , ADDRESS: [place] [address]   07/03/2019 -   Chief Complaint  Patient presents with  . Follow-up    PFT performed 12/7 and pt is here following up after that. Pt states she is still becoming SOB with exertion.  Follow-up emphysema Follow-up interstitial lung disease post COVID-19 in August 2020 (had elevatd d-dimer) Follow-up chronic hypoxemic respiratory failure secondary to both   HPI Maria [name] 65 y.o. -presents for the above issues.  She says her condition is returned to normal.  She is back working as a Forensic psychologist.  However she continues to be still significantly symptomatic.  She had a pulmonary function test June 24, 2019.  I visualized the result.  Shows isolated reduction in  diffusion capacity was only moderate at 57%.  FVC and FEV1 are normal.  She continues to be significantly symptomatic as documented below.  She is taking Trelegy.  She says she desaturates to 85% with exertion echocardiogram recently was normal.  She is asking if she should take the Moderna COVID-19 vaccine available in January 2021         IMPRESSION: CT chest 04/16/2019 1. 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 Given the presence of some bronchiectasis, this is technically characterized as probable UIP per current ATS guidelines, however, that is not favored. If there is persistent clinical concern for interstitial lung disease, repeat high-resolution chest CT would be recommended in 12 months to assess for temporal changes in the appearance of the lung parenchyma. 2. Diffuse bronchial wall thickening with moderate centrilobular and  paraseptal emphysema; imaging findings suggestive of underlying COPD. 3. Aortic atherosclerosis, in addition to right coronary artery disease. Please note that although the presence of coronary artery calcium documents the presence of coronary artery disease, the severity of this disease and any potential stenosis cannot be assessed on this non-gated CT examination. Assessment for potential risk factor modification, dietary therapy or pharmacologic therapy may be warranted, if clinically indicated.  Aortic Atherosclerosis (ICD10-I70.0) and Emphysema (ICD10-J43.9).   Electronically Signed   By: Vinnie Langton M.D.   On: 04/16/2019 13:12   OV 08/22/2019  Subjective:  Patient ID: Maria [name], female , DOB: December 30, 1955 , age 53 y.o. , MRN: [id number] , ADDRESS: [place] [id number]   08/22/2019 -   Chief Complaint  Patient presents with  . Follow-up    History of 2019 Novel coronavirus  disease (COVID-19)   Follow-up emphysema - alpha 1 MZ in dec 2020 Follow-up interstitial lung disease post COVID-19 in August 2020 Follow-up chronic hypoxemic respiratory failure secondary to both Ex smoker Echo sept 202 0 - ef 65% and diast dysfn  HPI Maria [name] 65 y.o. -presents for follow-up of the above issues.  In the interim she did have serologic work-up for her ILD that was discovered post Covid setting.  Her ANA and rheumatoid factor were trace positive.  Her ESR was 41.  Therefore we committed her to few to several week prednisone taper.  She says she is on a 5-week taper.  She is got 2 more weeks of this.  She says since her last visit she is feeling worse.  The worsening is in terms of shortness of breath.  Also has dizziness and floaters when she exerts.  In fact on a virtual rehab class she had to get on more oxygen.  Her dyspnea symptom scores are worse than before but cough and fatigue around the same.  She is requiring 3 L now with exertion.  Although in rehab the asked her to increase it to 6 L which helped but this was not documented because of worsening hypoxemia.  Today when we walked her she had to stop several times because of dizziness and fatigue although she did not desaturate.  Her lung function itself in December 2020 shows only isolated reduction in DLCO.   Of note she is alpha-1 MZ from her labs at last visit.    OV 04/29/2020   Subjective:  Patient ID: Maria [name], female , DOB: 02-06-1956, age 20 y.o. years. , MRN: [id number],  ADDRESS: [place] [address] PCP  [name], MD Providers : Treatment Team:  Attending Provider: Brand Males, MD Patient Care Team: [name], MD as PCP - General (Family Medicine)   Follow-up emphysema - alpha 1 MZ in dec 2020 Follow-up interstitial lung disease post COVID-19 in August 2020 -> feb 2021 Case conf ILD: NO ILD Follow-up chronic hypoxemic respiratory failure secondary to  both Ex smoker Echo sept 202 0 - ef 65% and diast dysfn Last CT scan of the chest February 2021  Chief Complaint  Patient presents with  . Follow-up    Pt states that she has had good days and bad days since last visit. Pt said that she is still having problems with SOB also has an occ cough.       HPI Maria [name] 65 y.o. -returns for follow-up.  Last seen by myself in February 2021.  After that she is seen nurse  practitioner.  Alpha-1 replacement was advised by nurse practitioner.  Initially denied by insurance but now apparently approved by insurance.  The still waiting for some of paperwork to be clear.  Currently on Trelegy.  She is on oxygen 3 L at rest 5 L with exertion.  She is fully vaccinated.  She is in need of Covid booster.  Overall doing well.  No flowsheet data found.        Simple office walk 185 feet x  3 laps goal with forehead probe 08/22/2019   O2 used 3L  Number laps completed Did only 2 of 3. Stopped 8 times and site down x 2. Got dizzy and floaters in eyes  Comments about pace Very slow pace  Resting Pulse Ox/HR 99% and 91/min  Final Pulse Ox/HR 99% during entire walk  Desaturated </= 88% x  Desaturated <= 3% points x  Got Tachycardic >/= 90/min yes  Symptoms at end of test As above  Miscellaneous comments Sub maximal exertion     Las\bs - alph 1 MZ in dec 2020 - autoimmune  - ANA 1:40, RF 23, and ESR 40s   PFT dec 2020  - isolated reduction in dlco 57%   PFT Results Latest Ref Rng & Units 01/16/2020 06/24/2019  FVC-Pre L 3.17 2.93  FVC-Predicted Pre % 90 83  FVC-Post L 3.25 3.39  FVC-Predicted Post % 92 96  Pre FEV1/FVC % % 55 56  Post FEV1/FCV % % 56 59  FEV1-Pre L 1.75 1.64  FEV1-Predicted Pre % 65 60  FEV1-Post L 1.83 1.99  DLCO uncorrected ml/min/mmHg 10.81 12.24  DLCO UNC% % 50 57  DLCO corrected ml/min/mmHg 11.29 -  DLCO COR %Predicted % 52 -  DLVA Predicted % 61 60  TLC L 5.25 5.81  TLC % Predicted % 98 108  RV % Predicted  % 86 110     OV 08/27/2020  Subjective:  Patient ID: Maria [name], female , DOB: 06-Oct-1955 , age 44 y.o. , MRN: [id number] , ADDRESS: [place] [address] PCP [name], MD Patient Care Team: [name], MD as PCP - General (Family Medicine)  This Provider for this visit: Treatment Team:  Attending Provider: Brand Males, MD    08/27/2020 -   Chief Complaint  Patient presents with  . Follow-up    Pt states she still has problems with SOB. States when the weather is colder her breathing is worse. Pt also has complaints of cough which happens all throughout the day.    Follow-up emphysema - alpha 1 MZ in dec 2020 Follow-up interstitial lung disease post COVID-19 in August 2020 -> feb 2021 Case conf ILD: NO ILD Follow-up chronic hypoxemic respiratory failure secondary to both Ex smoker 40 pack quit Echo sept 202 0 - ef 65% and diast dysfn Last CT scan of the chest February 2021  HPI Maria [name] 65 y.o. -returns to see me for the above issues.  Since last visit she has had a Covid vaccine booster.  Overall she is stable but for the last few months has had worsening cough.  The cough is dry.  She continues with oxygen 3-5 L.  We will try to get her alpha-1 replacement but she got denied by insurance.  She showed me the insurance denial letters on August 10, 2020.  Apparently there was a hearing which she was asked to participate but they sent the note is late and on the same day they  denied her appeal.  She feels this is all premeditated by the insurance company.  She is frustrated.  She continues with Trelegy and oxygen.  Review of the records indicate last CT scan of the chest was in February 2021.  She quit smoking less than 15 years ago.  She she is in need of annual CT scan now.  Other than the dry worsening cough she feels well.  There is no worsening wheezing or sputum.   CT Chest data  No results found.    PFT  PFT Results Latest Ref Rng  & Units 01/16/2020 06/24/2019  FVC-Pre L 3.17 2.93  FVC-Predicted Pre % 90 83  FVC-Post L 3.25 3.39  FVC-Predicted Post % 92 96  Pre FEV1/FVC % % 55 56  Post FEV1/FCV % % 56 59  FEV1-Pre L 1.75 1.64  FEV1-Predicted Pre % 65 60  FEV1-Post L 1.83 1.99  DLCO uncorrected ml/min/mmHg 10.81 12.24  DLCO UNC% % 50 57  DLCO corrected ml/min/mmHg 11.29 -  DLCO COR %Predicted % 52 -  DLVA Predicted % 61 60  TLC L 5.25 5.81  TLC % Predicted % 98 108  RV % Predicted % 86 110      OV 11/13/2020  Subjective:  Patient ID: Maria [name], female , DOB: July 03, 1956 , age 74 y.o. , MRN: [id number] , ADDRESS: [place] [address] PCP [name], MD Patient Care Team: [name], MD as PCP - General (Family Medicine)  This Provider for this visit: Treatment Team:  Attending Provider: Brand Males, MD    11/13/2020 -   Chief Complaint  Patient presents with  . Follow-up    PFT performed 4/28.  Pt states her cough is worse since last visit and states she still becomes SOB easily.   Follow-up emphysema - alpha 1 MZ in dec 2020  -- On Trelegy for few years as of 2022.  Dose increased early 2022  -Insurance denied alpha-1 replacement in 2020 07/2020 Follow-up interstitial lung disease post COVID-19 in August 2020 -> feb 2021 Case conf ILD: NO ILD Follow-up chronic hypoxemic respiratory failure secondary to both Ex smoker 40 pack quit Echo sept 202 0 - ef 65% and diast dysfn Last CT scan of the chest February 2021  Chronic coough since jan 2022  HPI Maria [name] 65 y.o. -returns for work-up.  I last saw her in February 2022.  At that time she was reporting worsening cough.  We decided to full work-up.  She had pulmonary function test that shows stability since 2020 although the DLCO is down [?  Technical issues, last hemoglobin 12.1 g% in 2021 May] her oxygen use is the same around 3 L at rest 5 L with exertion.  She uses Trelegy.  She is not on any  antihypertensives.  Not on any acid reflux medication.  Not on fish oil.  Her smoking is in remission.  The cough wakes her up at night.  Cough severity is documented below.  It significantly high suggesting irritable larynx syndrome/cough neuropathy.  She had CT scan of the chest that only shows emphysema.  No fibrosis or pneumonia or nodules or cancer.  She had an arterial blood gas and there is no hypercapnia.  She is frustrated by her quality of life with excessive cough.  The cough is driving most of the high COPD CAT score of 35.    Dr Lorenza Cambridge Reflux Symptom Index (> 13-15 suggestive of LPR cough) 0 ->  5  =  none ->severe problem 11/13/2020   Hoarseness of problem with voice 1  Clearing  Of Throat 1  Excess throat mucus or feeling of post nasal drip 2  Difficulty swallowing food, liquid or tablets 0  Cough after eating or lying down 4.5  Breathing difficulties or choking episodes 3.5  Troublesome or annoying cough 4  Sensation of something sticking in throat or lump in throat 3  Heartburn, chest pain, indigestion, or stomach acid coming up 0  TOTAL 19    CAT Score 11/13/2020  Total CAT Score 35         ABG Feb 2022  Results for SIANI, UTKE" (MRN [id number]) as of 11/13/2020 09:07  Ref. Range 08/31/2020 13:35  Sample type Unknown ARTERIAL DRAW  FIO2 Unknown 32.00  pH, Arterial Latest Ref Range: 7.350 - 7.450  7.417  pCO2 arterial Latest Ref Range: 32.0 - 48.0 mmHg 37.3  pO2, Arterial Latest Ref Range: 83.0 - 108.0 mmHg 87.3   CT Chest data feb 2022   IMPRESSION: 1.  No acute process in the chest. 2. Moderate centrilobular and paraseptal emphysema. No other explanation for patient's symptoms. Emphysema (ICD10-J43.9). 3.  Aortic Atherosclerosis (ICD10-I70.0).   Electronically Signed   By: Abigail Miyamoto M.D.   On: 09/04/2020 09:01   PFT  PFT Results Latest Ref Rng & Units 11/12/2020 01/16/2020 06/24/2019  FVC-Pre L 2.94 3.17 2.93  FVC-Predicted Pre % 84  90 83  FVC-Post L 3.18 3.25 3.39  FVC-Predicted Post % 91 92 96  Pre FEV1/FVC % % 54 55 56  Post FEV1/FCV % % 54 56 59  FEV1-Pre L 1.60 1.75 1.64  FEV1-Predicted Pre % 59 65 60  FEV1-Post L 1.71 1.83 1.99  DLCO uncorrected ml/min/mmHg 8.06 10.81 12.24  DLCO UNC% % 37 50 57  DLCO corrected ml/min/mmHg 8.06 11.29 -  DLCO COR %Predicted % 37 52 -  DLVA Predicted % 57 61 60  TLC L 4.89 5.25 5.81  TLC % Predicted % 91 98 108  RV % Predicted % 88 86 110       has a past medical history of Atherosclerosis (1/0612014), Blood transfusion without reported diagnosis (2005), Cancer ([place]) (Ovarian 1978), Chest pain, COPD (chronic obstructive pulmonary disease) ([place]), Emphysema of lung ([place]) (2019), and Oxygen deficiency (Sept 2020).   reports that she quit smoking about 4 years ago. Her smoking use included cigarettes. She has a 45.00 pack-year smoking history. She has never used smokeless tobacco.  Past Surgical History:  Procedure Laterality Date  . ABDOMINAL HYSTERECTOMY  1978  . CHOLECYSTECTOMY  2010  . NECK SURGERY  1999  . OVARIAN CYST REMOVAL  1988  . SPINE SURGERY  Neck 2002    Allergies  Allergen Reactions  . Montelukast     Bad dreams    Immunization History  Administered Date(s) Administered  . Influenza, Seasonal, Injecte, Preservative Fre 04/28/2008, 04/09/2010  . Influenza,inj,Quad PF,6+ Mos 04/22/2019, 04/25/2020  . Influenza-Unspecified 06/01/2008, 05/10/2016, 03/18/2017  . Moderna Sars-Covid-2 Vaccination 07/30/2019, 08/28/2019, 06/27/2020  . Pneumococcal Polysaccharide-23 04/15/2015, 04/22/2020  . Td 10/01/2020  . Tdap 11/17/2009  . Zoster Recombinat (Shingrix) 04/16/2020, 06/22/2020    Family History  Problem Relation Age of Onset  . Heart failure Mother   . Diabetes Mother   . Kidney disease Mother   . Hypertension Mother   . Arthritis Mother   . Heart disease Mother   . Obesity Mother   . COPD Father   .  Hearing loss Father   . Lung cancer Brother    . Asthma Son   . Diabetes Son   . Birth defects Son   . COPD Brother   . COPD Brother      Current Outpatient Medications:  .  ASPIRIN LOW DOSE 81 MG EC tablet, TAKE 1 TABLET BY MOUTH EVERY DAY, Disp: 90 tablet, Rfl: 3 .  atorvastatin (LIPITOR) 10 MG tablet, TAKE 1 TABLET BY MOUTH EVERY DAY, Disp: 90 tablet, Rfl: 1 .  azelastine (ASTELIN) 0.1 % nasal spray, PLACE 1 SPRAY INTO BOTH NOSTRILS 2 (TWO) TIMES DAILY, Disp: 30 mL, Rfl: 1 .  Budeson-Glycopyrrol-Formoterol (BREZTRI AEROSPHERE) 160-9-4.8 MCG/ACT AERO, Inhale 2 puffs into the lungs in the morning and at bedtime., Disp: 5.9 g, Rfl: 0 .  gabapentin (NEURONTIN) 300 MG capsule, Take 1tab once daily x7 days, then 1 tab twice daily and continue, Disp: 60 capsule, Rfl: 5 .  ipratropium-albuterol (DUONEB) 0.5-2.5 (3) MG/3ML SOLN, USE 1 AMPULE IN NEBULIZER 4 TIMES A DAY AS NEEDED    (Dx: J84.112, J44.9), Disp: 360 mL, Rfl: 5 .  OXYGEN, Inhale into the lungs. 3 liters at rest and 5 liters with any activity.-, Disp: , Rfl:  .  pantoprazole (PROTONIX) 20 MG tablet, TAKE 1 TABLET BY MOUTH TWICE A DAY, Disp: 180 tablet, Rfl: 1 .  Respiratory Therapy Supplies (FLUTTER) DEVI, Use as directed, Disp: 1 each, Rfl: 0 .  VENTOLIN HFA 108 (90 Base) MCG/ACT inhaler, Inhale 2 puffs into the lungs every 6 (six) hours as needed for shortness of breath., Disp: , Rfl:       Objective:   Vitals:   11/13/20 0847  BP: 118/80  Pulse: 68  Temp: (!) 97.4 F (36.3 C)  TempSrc: Temporal  SpO2: 100%  Weight: 176 lb (79.8 kg)  Height: '5\' 6"'  (1.676 m)    Estimated body mass index is 28.41 kg/m as calculated from the following:   Height as of this encounter: '5\' 6"'  (1.676 m).   Weight as of this encounter: 176 lb (79.8 kg).  '@WEIGHTCHANGE' @  [employer]   11/13/20 0847  Weight: 176 lb (79.8 kg)     Physical Exam  General: No distress.Marland Kitchen  He cough with clearing of the throat Neuro: Alert and Oriented x 3. GCS 15. Speech normal Psych:  Pleasant Resp:  Barrel Chest - yes.  Wheeze - no, Crackles - no, No overt respiratory distress CVS: Normal heart sounds. Murmurs - no Ext: Stigmata of Connective Tissue Disease - no HEENT: Normal upper airway. PEERL +. No post nasal drip        Assessment:       ICD-10-CM   1. Chronic respiratory failure with hypoxia (HCC)  J96.11   2. Alpha-1-antitrypsin deficiency carrier  Z14.8   3. Chronic cough  R05.3   4. Chronic obstructive pulmonary disease, unspecified COPD type ([place])  J44.9        Plan:     Patient Instructions  Chronic respiratory failure with hypoxia ([place]) Chronic obstructive pulmonary disease, unspecified COPD type ([place]) Alpha-1-antitrypsin deficiency carrier Stopped smoking with greater than 40 pack year history Chronic cough Post-COVID chronic cough  -Currently no flareup but is having worsening cough since Jan 2022 even before increase n trelegy dose - Prednisone burst feb 2022 did not help - CT chest 2022 only has emphysema - ABG without hypercapnia - PFT stble since 2020 but diffusion slightly worse - could be technical issue or slight progression in emphysema  -  this change makes pepple more short of breath instead of more cough  - No BP med or fish oil to explain worsening cough  - Cough worsening could be DPI Trelegy or cough neuropathy in excess of baseline copd -Too bad get insurance denied alpha-1 replacement  Plan -Continue oxygen therapy as before (3 to 5L [name]) - switch to BREZTRI 2 puff twice dailyt - use albuterol as needed  - refer voice rehab  - STart/Take gabapentin 360m once daily x 7 days, then 3063mtwice daily to continue. If this makes you too sleepy or drowsy call us[place]oreand we will cut your medication dosing down   FOllowup -4-8 weeks with Tammy Parrett to dicuss cough  - RSI cough score at followup        SIGNATURE    Dr. MuBrand MalesM.D., F.C.C.P,  Pulmonary and Critical Care Medicine Staff Physician, CoCambridgeirector - Interstitial Lung Disease  Program  Pulmonary FiJaspert [id number]: [phone] no answer or between  15:00h - 7:00h: call [phone] Telephone: [phone]  9:29 AM 11/13/2020

## 2020-12-11 ENCOUNTER — Other Ambulatory Visit: Payer: Self-pay | Admitting: Adult Health

## 2020-12-11 ENCOUNTER — Ambulatory Visit: Payer: BC Managed Care – PPO | Admitting: Adult Health

## 2020-12-11 ENCOUNTER — Other Ambulatory Visit: Payer: Self-pay

## 2020-12-11 ENCOUNTER — Encounter: Payer: Self-pay | Admitting: Adult Health

## 2020-12-11 DIAGNOSIS — E8801 Alpha-1-antitrypsin deficiency: Secondary | ICD-10-CM

## 2020-12-11 DIAGNOSIS — J449 Chronic obstructive pulmonary disease, unspecified: Secondary | ICD-10-CM | POA: Diagnosis not present

## 2020-12-11 DIAGNOSIS — J438 Other emphysema: Secondary | ICD-10-CM | POA: Diagnosis not present

## 2020-12-11 DIAGNOSIS — R053 Chronic cough: Secondary | ICD-10-CM | POA: Insufficient documentation

## 2020-12-11 DIAGNOSIS — J9611 Chronic respiratory failure with hypoxia: Secondary | ICD-10-CM

## 2020-12-11 MED ORDER — VENTOLIN HFA 108 (90 BASE) MCG/ACT IN AERS: 2.0000 | INHALATION_SPRAY | Freq: Four times a day (QID) | RESPIRATORY_TRACT | 2 refills | Status: DC | PRN

## 2020-12-11 MED ORDER — BREZTRI AEROSPHERE 160-9-4.8 MCG/ACT IN AERO: 2.0000 | INHALATION_SPRAY | Freq: Two times a day (BID) | RESPIRATORY_TRACT | 3 refills | Status: DC

## 2020-12-11 MED ORDER — BREZTRI AEROSPHERE 160-9-4.8 MCG/ACT IN AERO: 2.0000 | INHALATION_SPRAY | Freq: Two times a day (BID) | RESPIRATORY_TRACT | 0 refills | Status: DC

## 2020-12-11 MED ORDER — BENZONATATE 200 MG PO CAPS: 200.0000 mg | ORAL_CAPSULE | Freq: Three times a day (TID) | ORAL | 1 refills | Status: DC | PRN

## 2020-12-11 NOTE — Addendum Note (Signed)
Addended by: Vanessa Barbara on: 12/11/2020 10:27 AM   Modules accepted: Orders

## 2020-12-11 NOTE — Assessment & Plan Note (Signed)
Chronic cough for greater than 1.5 years.  This seemed to occur after COVID-19 infection in August 2020.  Patient has been tried on multiple steroid tapers without significant improvement.  She was recently started on gabapentin twice daily.  Has not had significant improvement.  We will add in cough control regimen.  Trigger prevention.  Plan  Patient Instructions  Add Robitussin DM 2 tsp every 4hrs for cough As needed   Add Tessalon Three times a day  For cough As needed   Add Chlortrimeton (Chlorpheniramine )  4mg  At bedtime   Continue on Gabapentin 300mg  Twice daily  .  Voice rest and sips of water to soothe throat   Continue on BREZTRI 2 puffs Twice daily  , rinse after use.  Continue on Protonix Twice daily   Continue on Oxygen 3lm at rest and 5l/m with activity .  Follow up with [name] or Milt Coye NP 4-6 weeks and As needed   Please contact office for sooner follow up if symptoms do not improve or worsen or seek emergency care

## 2020-12-11 NOTE — Progress Notes (Signed)
@Patient  ID: Maria [name], female    DOB: [birth year], 65 y.o.   MRN: [id number]  Chief Complaint  Patient presents with  . Follow-up    Referring provider: [name], MD  HPI: 65 year old female former smoker followed for moderate to severe COPD with emphysema, alpha-1 phenotype MZ (normal level), Chronic cough post Covid 19, chronic respiratory failure on home oxygen Critical illness ?~2007 at [facility](provoked on HRT) , respiratory failure on vent -trach/decannulation.   TEST/EVENTS :  06/24/19 PFTs- FEV1 1.99 (73%) p BD, DLCO 57% 01/16/20 PFTs - FEV1 1.75 (65%) p trelegy , DLCO 50% November 12, 2020 PFTs show FEV1 64%, ratio 54, FVC 91%, no significant bronchodilator response, DLCO 37%.  High-resolution CT chest August 30, 2019 negative for ILD chronic parenchymal band right lung base compatible with a nonspecific postinflammatory scarring.  Moderate to severe emphysema.  CT chest September 04, 2020 moderate emphysema.  12/11/2020 Follow up : COPD with emphysema, oxygen dependent respiratory failure, chronic cough Patient returns for a 1 month follow-up.  Patient has underlying moderate COPD with emphysema.  She also has alpha-1 phenotype MZ with a normal level. She is maintained on oxygen 3 L at rest and 5 L with activity. Patient has been dealing with an ongoing chronic cough since COVID-19 infection in August  2020.  Cough is persistent and feels getting progressively worse. Cough is very frustrating with cough associated urinary incontinence .  High resolution CT chest in 2021 and repeat CT chest February 2022 showed no evidence of interstitial lung disease.  Moderate emphysema. Patient has been recommended on cough control regimen.  Last visit she was started on gabapentin 300 mg twice daily.  She was also changed from Trelegy to [facility] in case the dry powder inhaler was aggravating her upper airway Patient says her cough is no better , unchanged .  Using  [place] cough drops.  Has been tried on steroid tapers in the past without any improvement.  Is not using any cough syrups.  Denies any fever, discolored mucus, hemoptysis, chest pain cough is all through the day and night . Sometimes worse at night      Allergies  Allergen Reactions  . Montelukast     Bad dreams    Immunization History  Administered Date(s) Administered  . Influenza, Seasonal, Injecte, Preservative Fre 04/28/2008, 04/09/2010  . Influenza,inj,Quad PF,6+ Mos 04/22/2019, 04/25/2020  . Influenza-Unspecified 06/01/2008, 05/10/2016, 03/18/2017  . Moderna Sars-Covid-2 Vaccination 07/30/2019, 08/28/2019, 06/27/2020  . Pneumococcal Polysaccharide-23 04/15/2015, 04/22/2020  . Td 10/01/2020  . Tdap 11/17/2009  . Zoster Recombinat (Shingrix) 04/16/2020, 06/22/2020    Past Medical History:  Diagnosis Date  . Atherosclerosis [id number]   aorta, iliacs and CFA bilaterally no greater than 0-49% - lower arterial doppler.  . Blood transfusion without reported diagnosis 2005  . Cancer ([place]) Ovarian 1978  . Chest pain   . COPD (chronic obstructive pulmonary disease) ([place])   . Emphysema of lung ([place]) 2019  . Oxygen deficiency Sept 2020    Tobacco History: Social History   Tobacco Use  Smoking Status Former Smoker  . Packs/day: 1.00  . Years: 45.00  . Pack years: 45.00  . Types: Cigarettes  . Quit date: 03/18/2016  . Years since quitting: 4.7  Smokeless Tobacco Never Used   Counseling given: Not Answered   Outpatient Medications Prior to Visit  Medication Sig Dispense Refill  . ASPIRIN LOW DOSE 81 MG EC tablet TAKE 1 TABLET BY MOUTH EVERY  DAY 90 tablet 3  . atorvastatin (LIPITOR) 10 MG tablet TAKE 1 TABLET BY MOUTH EVERY DAY 90 tablet 1  . azelastine (ASTELIN) 0.1 % nasal spray PLACE 1 SPRAY INTO BOTH NOSTRILS 2 (TWO) TIMES DAILY 30 mL 1  . Budeson-Glycopyrrol-Formoterol (BREZTRI AEROSPHERE) 160-9-4.8 MCG/ACT AERO Inhale 2 puffs into the lungs in the morning and at  bedtime. 5.9 g 0  . gabapentin (NEURONTIN) 300 MG capsule Take 1tab once daily x7 days, then 1 tab twice daily and continue 60 capsule 5  . ipratropium-albuterol (DUONEB) 0.5-2.5 (3) MG/3ML SOLN USE 1 AMPULE IN NEBULIZER 4 TIMES A DAY AS NEEDED    (Dx: J84.112, J44.9) 360 mL 5  . OXYGEN Inhale into the lungs. 3 liters at rest and 5 liters with any activity.-    . pantoprazole (PROTONIX) 20 MG tablet TAKE 1 TABLET BY MOUTH TWICE A DAY 180 tablet 1  . Respiratory Therapy Supplies (FLUTTER) DEVI Use as directed 1 each 0  . VENTOLIN HFA 108 (90 Base) MCG/ACT inhaler Inhale 2 puffs into the lungs every 6 (six) hours as needed for shortness of breath.     No facility-administered medications prior to visit.     Review of Systems:   Constitutional:   No  weight loss, night sweats,  Fevers, chills, + fatigue, or  lassitude.  HEENT:   No headaches,  Difficulty swallowing,  Tooth/dental problems, or  Sore throat,                No sneezing, itching, ear ache,  +nasal congestion, post nasal drip,   CV:  No chest pain,  Orthopnea, PND, swelling in lower extremities, anasarca, dizziness, palpitations, syncope.   GI  No heartburn, indigestion, abdominal pain, nausea, vomiting, diarrhea, change in bowel habits, loss of appetite, bloody stools.   Resp:    No chest wall deformity  Skin: no rash or lesions.  GU: no dysuria, change in color of urine, no urgency or frequency.  No flank pain, no hematuria   MS:  No joint pain or swelling.  No decreased range of motion.  No back pain.    Physical Exam  BP 130/60 (BP Location: Left Arm, Patient Position: Sitting, Cuff Size: Normal)   Pulse 94   Temp 98.7 F (37.1 C) (Temporal)   Ht 5' 5.5" (1.664 m)   Wt 172 lb 9.6 oz (78.3 kg)   SpO2 95%   BMI 28.29 kg/m   GEN: A/Ox3; pleasant , NAD, on O2    HEENT:  [place]/AT,  NOSE-clear, THROAT-clear, no lesions, no postnasal drip or exudate noted.   NECK:  Supple w/ fair ROM; no JVD; normal carotid  impulses w/o bruits; no thyromegaly or nodules palpated; no lymphadenopathy.    RESP  Clear  P & A; w/o, wheezes/ rales/ or rhonchi. no accessory muscle use, no dullness to percussion  CARD:  RRR, no m/r/g, no peripheral edema, pulses intact, no cyanosis or clubbing.  GI:   Soft & nt; nml bowel sounds; no organomegaly or masses detected.   Musco: Warm bil, no deformities or joint swelling noted.   Neuro: alert, no focal deficits noted.    Skin: Warm, no lesions or rashes    Lab Results:    BNP No results found for: BNP ProBNP   Imaging: No results found.    PFT Results Latest Ref Rng & Units 11/12/2020 01/16/2020 06/24/2019  FVC-Pre L 2.94 3.17 2.93  FVC-Predicted Pre % 84 90 83  FVC-Post L 3.18 3.25  3.39  FVC-Predicted Post % 91 92 96  Pre FEV1/FVC % % 54 55 56  Post FEV1/FCV % % 54 56 59  FEV1-Pre L 1.60 1.75 1.64  FEV1-Predicted Pre % 59 65 60  FEV1-Post L 1.71 1.83 1.99  DLCO uncorrected ml/min/mmHg 8.06 10.81 12.24  DLCO UNC% % 37 50 57  DLCO corrected ml/min/mmHg 8.06 11.29 -  DLCO COR %Predicted % 37 52 -  DLVA Predicted % 57 61 60  TLC L 4.89 5.25 5.81  TLC % Predicted % 91 98 108  RV % Predicted % 88 86 110    No results found for: NITRICOXIDE      Assessment & Plan:   Stage 2 moderate COPD by GOLD classification (HCC) Moderate COPD with emphysema, alpha-1 antitrypsin phenotype MZ.-Normal level Appears to be currently stable however patient has a chronic cough that has been present since COVID-19 in 2020.  Continues to have's high symptom burden with ongoing cough, decreased activity tolerance.  No increased oxygen demands.  High-resolution CT chest has been negative for interstitial lung disease.  Showing moderate emphysema. Pulmonary function testing has showed stable lung volumes but declining diffusing capacity. Continue on triple therapy. Plan  Patient Instructions  Add Robitussin DM 2 tsp every 4hrs for cough As needed   Add Tessalon Three  times a day  For cough As needed   Add Chlortrimeton (Chlorpheniramine )  4mg  At bedtime   Continue on Gabapentin 300mg  Twice daily  .  Voice rest and sips of water to soothe throat   Continue on BREZTRI 2 puffs Twice daily  , rinse after use.  Continue on Protonix Twice daily   Continue on Oxygen 3lm at rest and 5l/m with activity .  Follow up with [name] or Maela Takeda NP 4-6 weeks and As needed   Please contact office for sooner follow up if symptoms do not improve or worsen or seek emergency care        Chronic respiratory failure with hypoxia ([place]) Compensated on present level of oxygen.  No increased oxygen demands.  Continue oxygen to maintain O2 saturations greater than 88 to 90%.  Plan  Patient Instructions  Add Robitussin DM 2 tsp every 4hrs for cough As needed   Add Tessalon Three times a day  For cough As needed   Add Chlortrimeton (Chlorpheniramine )  4mg  At bedtime   Continue on Gabapentin 300mg  Twice daily  .  Voice rest and sips of water to soothe throat   Continue on BREZTRI 2 puffs Twice daily  , rinse after use.  Continue on Protonix Twice daily   Continue on Oxygen 3lm at rest and 5l/m with activity .  Follow up with [name] or Alexys Lobello NP 4-6 weeks and As needed   Please contact office for sooner follow up if symptoms do not improve or worsen or seek emergency care        Emphysema due to alpha-1-antitrypsin deficiency ([place]) Alpha-1 phenotype MZ with normal level.  Patient has been recommended for alpha-1 replacement therapy however her insurance declined in the past.  Chronic cough Chronic cough for greater than 1.5 years.  This seemed to occur after COVID-19 infection in August 2020.  Patient has been tried on multiple steroid tapers without significant improvement.  She was recently started on gabapentin twice daily.  Has not had significant improvement.  We will add in cough control regimen.  Trigger prevention.  Plan  Patient Instructions   Add Robitussin DM 2 tsp  every 4hrs for cough As needed   Add Tessalon Three times a day  For cough As needed   Add Chlortrimeton (Chlorpheniramine )  4mg  At bedtime   Continue on Gabapentin 300mg  Twice daily  .  Voice rest and sips of water to soothe throat   Continue on BREZTRI 2 puffs Twice daily  , rinse after use.  Continue on Protonix Twice daily   Continue on Oxygen 3lm at rest and 5l/m with activity .  Follow up with [name] or Asta Corbridge NP 4-6 weeks and As needed   Please contact office for sooner follow up if symptoms do not improve or worsen or seek emergency care           Rexene Edison, NP 12/11/2020

## 2020-12-11 NOTE — Assessment & Plan Note (Signed)
Alpha-1 phenotype MZ with normal level.  Patient has been recommended for alpha-1 replacement therapy however her insurance declined in the past.

## 2020-12-11 NOTE — Patient Instructions (Signed)
Add Robitussin DM 2 tsp every 4hrs for cough As needed   Add Tessalon Three times a day  For cough As needed   Add Chlortrimeton (Chlorpheniramine )  4mg  At bedtime   Continue on Gabapentin 300mg  Twice daily  .  Voice rest and sips of water to soothe throat   Continue on BREZTRI 2 puffs Twice daily  , rinse after use.  Continue on Protonix Twice daily   Continue on Oxygen 3lm at rest and 5l/m with activity .  Follow up with [name] or Roni Friberg NP 4-6 weeks and As needed   Please contact office for sooner follow up if symptoms do not improve or worsen or seek emergency care

## 2020-12-11 NOTE — Assessment & Plan Note (Signed)
Compensated on present level of oxygen.  No increased oxygen demands.  Continue oxygen to maintain O2 saturations greater than 88 to 90%.  Plan  Patient Instructions  Add Robitussin DM 2 tsp every 4hrs for cough As needed   Add Tessalon Three times a day  For cough As needed   Add Chlortrimeton (Chlorpheniramine )  4mg  At bedtime   Continue on Gabapentin 300mg  Twice daily  .  Voice rest and sips of water to soothe throat   Continue on BREZTRI 2 puffs Twice daily  , rinse after use.  Continue on Protonix Twice daily   Continue on Oxygen 3lm at rest and 5l/m with activity .  Follow up with [name] or Jefrey Raburn NP 4-6 weeks and As needed   Please contact office for sooner follow up if symptoms do not improve or worsen or seek emergency care

## 2020-12-11 NOTE — Assessment & Plan Note (Signed)
Moderate COPD with emphysema, alpha-1 antitrypsin phenotype MZ.-Normal level Appears to be currently stable however patient has a chronic cough that has been present since COVID-19 in 2020.  Continues to have's high symptom burden with ongoing cough, decreased activity tolerance.  No increased oxygen demands.  High-resolution CT chest has been negative for interstitial lung disease.  Showing moderate emphysema. Pulmonary function testing has showed stable lung volumes but declining diffusing capacity. Continue on triple therapy. Plan  Patient Instructions  Add Robitussin DM 2 tsp every 4hrs for cough As needed   Add Tessalon Three times a day  For cough As needed   Add Chlortrimeton (Chlorpheniramine )  4mg  At bedtime   Continue on Gabapentin 300mg  Twice daily  .  Voice rest and sips of water to soothe throat   Continue on BREZTRI 2 puffs Twice daily  , rinse after use.  Continue on Protonix Twice daily   Continue on Oxygen 3lm at rest and 5l/m with activity .  Follow up with [name] or Errin Whitelaw NP 4-6 weeks and As needed   Please contact office for sooner follow up if symptoms do not improve or worsen or seek emergency care

## 2021-01-04 ENCOUNTER — Other Ambulatory Visit: Payer: Self-pay | Admitting: Internal Medicine

## 2021-01-19 ENCOUNTER — Encounter: Payer: Self-pay | Admitting: Internal Medicine

## 2021-01-19 ENCOUNTER — Other Ambulatory Visit: Payer: Self-pay

## 2021-01-19 ENCOUNTER — Ambulatory Visit: Payer: BC Managed Care – PPO | Admitting: Internal Medicine

## 2021-01-19 VITALS — BP 108/74 | HR 76 | Temp 97.8°F | Ht 65.5 in | Wt 171.2 lb

## 2021-01-19 DIAGNOSIS — J449 Chronic obstructive pulmonary disease, unspecified: Secondary | ICD-10-CM | POA: Diagnosis not present

## 2021-01-19 DIAGNOSIS — J9611 Chronic respiratory failure with hypoxia: Secondary | ICD-10-CM | POA: Diagnosis not present

## 2021-01-19 DIAGNOSIS — R053 Chronic cough: Secondary | ICD-10-CM

## 2021-01-19 DIAGNOSIS — Z148 Genetic carrier of other disease: Secondary | ICD-10-CM

## 2021-01-19 MED ORDER — PREDNISONE 10 MG PO TABS: ORAL_TABLET | ORAL | 0 refills | Status: AC

## 2021-01-19 NOTE — Patient Instructions (Addendum)
ICD-10-CM   1. Chronic respiratory failure with hypoxia (HCC)  J96.11     2. Stage 2 moderate COPD by GOLD classification ([place])  J44.9     3. Alpha-1-antitrypsin deficiency carrier  Z14.8     4. Chronic cough  R05.3       Stable disease but still with significant daily cough despite COPD treatment and cough therapy  Plan - Continue Robitussin DM 2 tsp every 4hrs for cough As needed   - continue Tessalon Three times a day  For cough As needed   - continue Chlortrimeton (Chlorpheniramine )  4mg  At bedtime but can take it as needed instead of scheduled - Continue on Gabapentin but increase it from 300mg  Twice daily  to 300 mg 3 times daily - refer voice rehab Mr Garald Balding  - try 8  day empiric prednisone -Take prednisone 40 mg daily x 2 days, then 20mg  daily x 2 days, then 10mg  daily x 2 days, then 5mg  daily x 2 days and stop - Voice rest and sips of water to soothe throat   - Continue on BREZTRI 2 puffs Twice daily  , rinse after use.  - Continue on Protonix Twice daily   Continue on Oxygen 3lm at rest and 5l/m with activity    -Keep her pulse ox greater than 88% [today was 92% on room air at rest  Follow up with  - [name] or [place] NP 2-3 months and As needed    -If cough not improving, then we should just slowly taper up gabapentin off - Please contact office for sooner follow up if symptoms do not improve or worsen or seek emergency care

## 2021-01-19 NOTE — Progress Notes (Signed)
Subjective:  Patient ID: Maria [name], female , DOB: December 17, 1955 , age 65 y.o. , MRN: [id number] , ADDRESS: [place] [address]   03/19/2019 -   Chief Complaint  Patient presents with   Hospitalization Follow-up    Post covid symptoms   Post COVID  follow-up in the pulmonary office  HPI [name] 65 y.o. 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  And also poor intake.  Was diagnosed to have acute COVID-19.  She remembers being on oxygen.  At the time prior to admission she is had chronic hip pain based on her history and review of the records for which she was using a cane.  She spent a total of 5 days at [facility] [place] [facility] and was discharged on March 03, 2019.  She was treated with oxygen, Solu-Medrol and REM does Advair.  Last set of laboratories reviewed on March 03, 2019 which showed that she had normal hemoglobin and creatinine and his CRP had improved.  The only chest x-ray that was available is 1 on admission in February 26, 2019 but she has some basal infiltrates.  She now presents to the pulmonary clinic on March 19, 2019.  It is 21 days since admission and she is over 21 days since her first set of symptoms.  She tells me that she is a lot more fatigue.  She is so fatigued that she is not even able to take her inhaler properly which is Trelegy for her COPD.  She feels short of breath all the time.  She is requiring a lot of help taking a shower.  Her hip pain continues.  She is now needing the use of a walker.  Her ECOG is 4.  Her appetite is good.  She is currently not using oxygen either at night or in the daytime.   Results for TAHIRIH, LAIR" (MRN [id number]) as of  08/22/2019 12:57  Ref. Range 03/01/2019 03:22 03/02/2019 01:55 03/03/2019 01:54  D-Dimer, America Brown Latest Ref Range: 0.00 - 0.50 ug/mL-FEU 1.34 (H) 1.12 (H) 1.28 (H)       04/03/2019 Presents today for a 2-week follow-up. 65 y.o.  Echocardiogram showed normal systolic function with estimated EF 60 to 65%.  Left ventricle diastolic doppler consistent with impaired relaxation-determinant filling pressures.  Right ventricle with normal systolic function, increased wall thickness-systolic pressure could not be assessed.  HRCT is scheduled for 9/29.  Patient called on 9/4 asking about portable oxygen tank order.  Reports that her O2 level has read 84 to 85% after exertion with a heart rate of 110.  She reports that her oxygen level recovers greater than 90% after resting.  Is not currently on oxygen at home will need to be walked in office to qualify for POC. Repeat COVID testing was positive, patient reports that she still has lost her taste and smell with increased shortness of breath.   She is feeling a little better today. Family is checking on her. No other help at home. Reports feeling fine when sitting but her O2 drops as low as 85% on exertion. She is able to recover with rest. Taking prednisone taper as prescribed, feels it has helped her chest tightness. She has dry/hacking cough  with no mucus production. She is eating and drinking ok. Denies fever.   04/09/2019 Patient presents today for 1 week follow-up. Unfortunately we were unable to get her set up with home health d/t her insurance. She is here today for qualifying O2 walk. She continues to have some shortness of breath and dry cough. Low energy. Her taste and smell as returned some. She is drinking plenty of fluids. Reports that her cough is not quite as bad, states the more she talks she has to cough. Has trouble sleeping at night d/t her respiratory symptoms. Feels delsym and tessalon perles have been helpful. ONO on RA showed O2 low 70% with baseline 87%.  Ambulatory O2 today showed O2 desaturation 84% on RA, requiring 3L. Using [employer] for nebulizer supplies. Denies fever, weight loss, hemoptysis, purulent mucus.   04/22/2019 Patient presents today for 2 week follow-up. Breathing is the same. Continues to have a dry cough. Completed prednisone 2 days ago. Using incentive spirometry. She has tried to go without her oxygen at home but states that she gets too short winded. 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 Diffuse bronchial wall thickening with moderate centrilobular and paraseptal emphysema; imaging findings suggestive of underlying COPD. She had COPD symptoms before getting COVID. Her breathing was getting a little worse before her diagnosis.  Sleeping better at night with oxygen. Ambulating with rolling walker, still deconditioned/weak. Due for influenza vaccine today.   Oxygen readings:  Resting O2 93% on RA  Ambulatory O2 98-100 on 3L Ambulatory O2 85% on RA  OV 07/03/2019  Subjective:  Patient ID: Maria [name], female , DOB: 08-Sep-1955 , age 65 y.o. , MRN: [id number] , ADDRESS: [place] [id number]   07/03/2019 -   Chief Complaint  Patient presents with   Follow-up    PFT performed 12/7 and pt is here following up after that. Pt states she is still becoming SOB with exertion.  Follow-up emphysema Follow-up interstitial lung disease post COVID-19 in August 2020 (had elevatd d-dimer) Follow-up chronic hypoxemic respiratory failure secondary to both   HPI Maria [name] 65 y.o. -presents for the above issues.  She says her condition is returned to normal.  She is back working as a Forensic psychologist.  However she continues to be still significantly symptomatic.  She had a pulmonary function test June 24, 2019.  I visualized the result.  Shows isolated reduction in diffusion capacity  was only moderate at 57%.  FVC and FEV1 are normal.  She continues to be significantly symptomatic as documented below.  She is taking Trelegy.  She says she desaturates to 85% with exertion echocardiogram recently was normal.  She is asking if she should take the Moderna COVID-19 vaccine available in January 2021         IMPRESSION: CT chest 04/16/2019 1. 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 Given the presence of some bronchiectasis, this is technically characterized as probable UIP per current ATS guidelines, however, that is not favored. If there is persistent clinical concern for interstitial lung disease, repeat high-resolution chest CT would be recommended in 12 months to assess for temporal changes in the appearance of the lung parenchyma. 2. Diffuse bronchial wall thickening with moderate centrilobular and paraseptal emphysema;  imaging findings suggestive of underlying COPD. 3. Aortic atherosclerosis, in addition to right coronary artery disease. Please note that although the presence of coronary artery calcium documents the presence of coronary artery disease, the severity of this disease and any potential stenosis cannot be assessed on this non-gated CT examination. Assessment for potential risk factor modification, dietary therapy or pharmacologic therapy may be warranted, if clinically indicated.   Aortic Atherosclerosis (ICD10-I70.0) and Emphysema (ICD10-J43.9).     Electronically Signed   By: Vinnie Langton M.D.   On: 04/16/2019 13:12   OV 08/22/2019  Subjective:  Patient ID: Maria [name], female , DOB: 06-Jun-1956 , age 62 y.o. , MRN: [id number] , ADDRESS: [place] [id number]   08/22/2019 -   Chief Complaint  Patient presents with   Follow-up    History of 2019 Novel coronavirus disease (COVID-19)    Follow-up emphysema - alpha 1 MZ in dec 2020 Follow-up interstitial lung disease post COVID-19 in August 2020 Follow-up chronic hypoxemic respiratory failure secondary to both Ex smoker Echo sept 202 0 - ef 65% and diast dysfn  HPI BAKER KOGLER 65 y.o. -presents for follow-up of the above issues.  In the interim she did have serologic work-up for her ILD that was discovered post Covid setting.  Her ANA and rheumatoid factor were trace positive.  Her ESR was 41.  Therefore we committed her to few to several week prednisone taper.  She says she is on a 5-week taper.  She is got 2 more weeks of this.  She says since her last visit she is feeling worse.  The worsening is in terms of shortness of breath.  Also has dizziness and floaters when she exerts.  In fact on a virtual rehab class she had to get on more oxygen.  Her dyspnea symptom scores are worse than before but cough and fatigue around the same.  She is requiring 3 L now with exertion.  Although in rehab the asked her to increase it to 6 L which helped but this was not documented because of worsening hypoxemia.  Today when we walked her she had to stop several times because of dizziness and fatigue although she did not desaturate.  Her lung function itself in December 2020 shows only isolated reduction in DLCO.   Of note she is alpha-1 MZ from her labs at last visit.    OV 04/29/2020   Subjective:  Patient ID: Maria [name], female , DOB: [birth year], age 29 y.o. years. , MRN: [id number],  ADDRESS: [place] [address] PCP  [name], MD Providers : Treatment Team:  Attending Provider: Brand Males, MD Patient Care Team: [name], MD as PCP - General (Family Medicine)   Follow-up emphysema - alpha 1 MZ in dec 2020 Follow-up interstitial lung disease post COVID-19 in August 2020 -> feb 2021 Case conf ILD: NO ILD Follow-up chronic hypoxemic respiratory failure secondary to both Ex smoker Echo sept  202 0 - ef 65% and diast dysfn Last CT scan of the chest February 2021  Chief Complaint  Patient presents with   Follow-up    Pt states that she has had good days and bad days since last visit. Pt said that she is still having problems with SOB also has an occ cough.       HPI NEITA LANDRIGAN 65 y.o. -returns for follow-up.  Last seen by myself in February 2021.  After that she is seen  nurse practitioner.  Alpha-1 replacement was advised by nurse practitioner.  Initially denied by insurance but now apparently approved by insurance.  The still waiting for some of paperwork to be clear.  Currently on Trelegy.  She is on oxygen 3 L at rest 5 L with exertion.  She is fully vaccinated.  She is in need of Covid booster.  Overall doing well.  No flowsheet data found.        Simple office walk 185 feet x  3 laps goal with forehead probe 08/22/2019   O2 used 3L  Number laps completed Did only 2 of 3. Stopped 8 times and site down x 2. Got dizzy and floaters in eyes  Comments about pace Very slow pace  Resting Pulse Ox/HR 99% and 91/min  Final Pulse Ox/HR 99% during entire walk  Desaturated </= 88% x  Desaturated <= 3% points x  Got Tachycardic >/= 90/min yes  Symptoms at end of test As above  Miscellaneous comments Sub maximal exertion     Las\bs - alph 1 MZ in dec 2020 - autoimmune  - ANA 1:40, RF 23, and ESR 40s   PFT dec 2020  - isolated reduction in dlco 57%   OV 08/27/2020  Subjective:  Patient ID: Maria [name], female , DOB: July 10, 1956 , age 29 y.o. , MRN: [id number] , ADDRESS: [place] [id number] PCP [name], MD Patient Care Team: [name], MD as PCP - General (Family Medicine)  This Provider for this visit: Treatment Team:  Attending Provider: Brand Males, MD    08/27/2020 -   Chief Complaint  Patient presents with   Follow-up    Pt states she still has problems with SOB. States when the weather is colder her breathing  is worse. Pt also has complaints of cough which happens all throughout the day.    Follow-up emphysema - alpha 1 MZ in dec 2020 Follow-up interstitial lung disease post COVID-19 in August 2020 -> feb 2021 Case conf ILD: NO ILD Follow-up chronic hypoxemic respiratory failure secondary to both Ex smoker 40 pack quit Echo sept 202 0 - ef 65% and diast dysfn Last CT scan of the chest February 2021  HPI MCKINNLEY COTTIER 65 y.o. -returns to see me for the above issues.  Since last visit she has had a Covid vaccine booster.  Overall she is stable but for the last few months has had worsening cough.  The cough is dry.  She continues with oxygen 3-5 L.  We will try to get her alpha-1 replacement but she got denied by insurance.  She showed me the insurance denial letters on August 10, 2020.  Apparently there was a hearing which she was asked to participate but they sent the note is late and on the same day they denied her appeal.  She feels this is all premeditated by the insurance company.  She is frustrated.  She continues with Trelegy and oxygen.  Review of the records indicate last CT scan of the chest was in February 2021.  She quit smoking less than 15 years ago.  She she is in need of annual CT scan now.  Other than the dry worsening cough she feels well.  There is no worsening wheezing or sputum.   CT Chest data  No results found.       OV 11/13/2020  Subjective:  Patient ID: Maria [name], female , DOB: 1956-05-07 , age 42 y.o. , MRN:  [id number] , ADDRESS: [place] [address] PCP [name], MD Patient Care Team: [name], MD as PCP - General (Family Medicine)  This Provider for this visit: Treatment Team:  Attending Provider: Brand Males, MD    11/13/2020 -   Chief Complaint  Patient presents with   Follow-up    PFT performed 4/28.  Pt states her cough is worse since last visit and states she still becomes SOB easily.   Follow-up emphysema -  alpha 1 MZ in dec 2020  -- On Trelegy for few years as of 2022.  Dose increased early 2022  -Insurance denied alpha-1 replacement in 2020 07/2020 Follow-up interstitial lung disease post COVID-19 in August 2020 -> feb 2021 Case conf ILD: NO ILD Follow-up chronic hypoxemic respiratory failure secondary to both Ex smoker 40 pack quit Echo sept 202 0 - ef 65% and diast dysfn Last CT scan of the chest February 2021  Chronic coough since jan 2022  HPI MCKYNNA VANLOAN 65 y.o. -returns for work-up.  I last saw her in February 2022.  At that time she was reporting worsening cough.  We decided to full work-up.  She had pulmonary function test that shows stability since 2020 although the DLCO is down [?  Technical issues, last hemoglobin 12.1 g% in 2021 May] her oxygen use is the same around 3 L at rest 5 L with exertion.  She uses Trelegy.  She is not on any antihypertensives.  Not on any acid reflux medication.  Not on fish oil.  Her smoking is in remission.  The cough wakes her up at night.  Cough severity is documented below.  It significantly high suggesting irritable larynx syndrome/cough neuropathy.  She had CT scan of the chest that only shows emphysema.  No fibrosis or pneumonia or nodules or cancer.  She had an arterial blood gas and there is no hypercapnia.  She is frustrated by her quality of life with excessive cough.  The cough is driving most of the high COPD CAT score of 35.    Dr Lorenza Cambridge Reflux Symptom Index (> 13-15 suggestive of LPR cough) 0 -> 5  =  none ->severe problem 11/13/2020   Hoarseness of problem with voice 1  Clearing  Of Throat 1  Excess throat mucus or feeling of post nasal drip 2  Difficulty swallowing food, liquid or tablets 0  Cough after eating or lying down 4.5  Breathing difficulties or choking episodes 3.5  Troublesome or annoying cough 4  Sensation of something sticking in throat or lump in throat 3  Heartburn, chest pain, indigestion, or stomach acid coming up  0  TOTAL 19    CAT Score 11/13/2020  Total CAT Score 35         ABG Feb 2022  Results for CARSYNN, BETHUNE "CINDY" (MRN [id number]) as of 11/13/2020 09:07  Ref. Range 08/31/2020 13:35  Sample type Unknown ARTERIAL DRAW  FIO2 Unknown 32.00  pH, Arterial Latest Ref Range: 7.350 - 7.450  7.417  pCO2 arterial Latest Ref Range: 32.0 - 48.0 mmHg 37.3  pO2, Arterial Latest Ref Range: 83.0 - 108.0 mmHg 87.3   CT Chest data feb 2022   IMPRESSION: 1.  No acute process in the chest. 2. Moderate centrilobular and paraseptal emphysema. No other explanation for patient's symptoms. Emphysema (ICD10-J43.9). 3.  Aortic Atherosclerosis (ICD10-I70.0).     Electronically Signed   By: Abigail Miyamoto M.D.   On: 09/04/2020 09:01  12/11/2020 Follow up : COPD with emphysema, oxygen dependent respiratory failure, chronic cough Patient returns for a 1 month follow-up.  Patient has underlying moderate COPD with emphysema.  She also has alpha-1 phenotype MZ with a normal level. She is maintained on oxygen 3 L at rest and 5 L with activity. Patient has been dealing with an ongoing chronic cough since COVID-19 infection in August  2020.  Cough is persistent and feels getting progressively worse. Cough is very frustrating with cough associated urinary incontinence .  High resolution CT chest in 2021 and repeat CT chest February 2022 showed no evidence of interstitial lung disease.  Moderate emphysema. Patient has been recommended on cough control regimen.  Last visit she was started on gabapentin 300 mg twice daily.  She was also changed from Trelegy to [facility] in case the dry powder inhaler was aggravating her upper airway Patient says her cough is no better , unchanged .  Using Halls cough drops.  Has been tried on steroid tapers in the past without any improvement.  Is not using any cough syrups.  Denies any fever, discolored mucus, hemoptysis, chest pain cough is all through the day and night . Sometimes  worse at night     OV 01/19/2021  Subjective:  Patient ID: Maria [name], female , DOB: [birth year] , age 10 y.o. , MRN: [id number] , ADDRESS: [id number] [place] [facility] [id number] PCP [name], MD Patient Care Team: [name], MD as PCP - General (Family Medicine)  This Provider for this visit: Treatment Team:  Attending Provider: Brand Males, MD  Follow-up emphysema - alpha 1 MZ in dec 2020  -- On Trelegy for few years as of 2022.  Dose increased early 2022  -Insurance denied alpha-1 replacement in 2020 07/2020 Follow-up interstitial lung disease post COVID-19 in August 2020 -> feb 2021 Case conf ILD: NO ILD Follow-up chronic hypoxemic respiratory failure secondary to both Ex smoker 40 pack quit Echo sept 202 0 - ef 65% and diast dysfn Last CT scan of the chest February 2022  Chronic coough since jan 2022  01/19/2021 -   Chief Complaint  Patient presents with   Follow-up    Pt states she still has complaints of coughing which is about the same since last visit. States she is coughing up clear thick phlegm. States SOB is worse when she goes outside in the humidity but is fine if she stays inside.     HPI PORTER NAKAMA 65 y.o. -returns for follow-up of her COPD and severe chronic cough.  She continues to use 3 L of oxygen.  She says without oxygen at rest she starts getting dizzy.  However after return the oxygen off her pulse ox was 92% on room air at rest 10 minutes later.  Overall she feels she is stable but she continues to have significant amount of cough that she rates as moderate.  Wakes her up at night.  There is no associated wheezing.  She does clear her throat and has a laryngeal quality to the cough.  Last visit nurse practitioner added Chlor-Trimeton and Tessalon and Robitussin but despite this cough persist.  She continues on gabapentin 3 mg twice daily.  She has never been to voice rehabilitation.  She is not sure if prednisone has helped her cough  but she knows prednisone makes her irritable but she is willing to give it a short-term empiric trial    CAT Score 12/11/2020 11/13/2020  Total CAT  Score 18 35     IMPRESSION: 1.  No acute process in the chest. 2. Moderate centrilobular and paraseptal emphysema. No other explanation for patient's symptoms. Emphysema (ICD10-J43.9). 3.  Aortic Atherosclerosis (ICD10-I70.0).     Electronically Signed   By: Abigail Miyamoto M.D.   On: 09/04/2020 09:01  PFT  PFT Results Latest Ref Rng & Units 11/12/2020 01/16/2020 06/24/2019  FVC-Pre L 2.94 3.17 2.93  FVC-Predicted Pre % 84 90 83  FVC-Post L 3.18 3.25 3.39  FVC-Predicted Post % 91 92 96  Pre FEV1/FVC % % 54 55 56  Post FEV1/FCV % % 54 56 59  FEV1-Pre L 1.60 1.75 1.64  FEV1-Predicted Pre % 59 65 60  FEV1-Post L 1.71 1.83 1.99  DLCO uncorrected ml/min/mmHg 8.06 10.81 12.24  DLCO UNC% % 37 50 57  DLCO corrected ml/min/mmHg 8.06 11.29 -  DLCO COR %Predicted % 37 52 -  DLVA Predicted % 57 61 60  TLC L 4.89 5.25 5.81  TLC % Predicted % 91 98 108  RV % Predicted % 88 86 110       has a past medical history of Atherosclerosis (1/0612014), Blood transfusion without reported diagnosis (2005), Cancer ([place]) (Ovarian 1978), Chest pain, COPD (chronic obstructive pulmonary disease) ([place]), Emphysema of lung ([place]) (2019), and Oxygen deficiency (Sept 2020).   reports that she quit smoking about 4 years ago. Her smoking use included cigarettes. She has a 45.00 pack-year smoking history. She has never used smokeless tobacco.  Past Surgical History:  Procedure Laterality Date   ABDOMINAL HYSTERECTOMY  1978   CHOLECYSTECTOMY  2010   [place]   OVARIAN CYST REMOVAL  1988   SPINE SURGERY  Neck 2002    Allergies  Allergen Reactions   Montelukast     Bad dreams    Immunization History  Administered Date(s) Administered   Influenza, Seasonal, Injecte, Preservative Fre 04/28/2008, 04/09/2010   Influenza,inj,Quad PF,6+ Mos 04/22/2019,  04/25/2020   Influenza-Unspecified 06/01/2008, 05/10/2016, 03/18/2017   Moderna Sars-Covid-2 Vaccination 07/30/2019, 08/28/2019, 06/27/2020   Pneumococcal Polysaccharide-23 04/15/2015, 04/22/2020   Td 10/01/2020   Tdap 11/17/2009   Zoster Recombinat (Shingrix) 04/16/2020, 06/22/2020    Family History  Problem Relation Age of Onset   Heart failure Mother    Diabetes Mother    Kidney disease Mother    Hypertension Mother    Arthritis Mother    Heart disease Mother    Obesity Mother    COPD Father    Hearing loss Father    Lung cancer Brother    Asthma Son    Diabetes Son    Birth defects Son    COPD Brother    COPD Brother      Current Outpatient Medications:    ASPIRIN LOW DOSE 81 MG EC tablet, TAKE 1 TABLET BY MOUTH EVERY DAY, Disp: 90 tablet, Rfl: 3   atorvastatin (LIPITOR) 10 MG tablet, TAKE 1 TABLET BY MOUTH EVERY DAY, Disp: 90 tablet, Rfl: 1   azelastine (ASTELIN) 0.1 % nasal spray, SPRAY 1 SPRAY INTO EACH NOSTRIL TWICE A DAY, Disp: 30 mL, Rfl: 1   benzonatate (TESSALON) 200 MG capsule, Take 1 capsule (200 mg total) by mouth 3 (three) times daily as needed for cough., Disp: 90 capsule, Rfl: 1   Budeson-Glycopyrrol-Formoterol (BREZTRI AEROSPHERE) 160-9-4.8 MCG/ACT AERO, Inhale 2 puffs into the lungs in the morning and at bedtime., Disp: 10.7 g, Rfl: 3   gabapentin (NEURONTIN) 300 MG capsule, Take 1tab once daily x7 days,  then 1 tab twice daily and continue, Disp: 60 capsule, Rfl: 5   ipratropium-albuterol (DUONEB) 0.5-2.5 (3) MG/3ML SOLN, USE 1 AMPULE IN NEBULIZER 4 TIMES A DAY AS NEEDED    (Dx: J84.112, J44.9), Disp: 360 mL, Rfl: 5   OXYGEN, Inhale into the lungs. 3 liters at rest and 5 liters with any activity.-, Disp: , Rfl:    pantoprazole (PROTONIX) 20 MG tablet, TAKE 1 TABLET BY MOUTH TWICE A DAY, Disp: 180 tablet, Rfl: 1   Respiratory Therapy Supplies (FLUTTER) DEVI, Use as directed, Disp: 1 each, Rfl: 0   VENTOLIN HFA 108 (90 Base) MCG/ACT inhaler, Inhale 2 puffs  into the lungs every 6 (six) hours as needed for shortness of breath., Disp: 18 g, Rfl: 2      Objective:   Vitals:   01/19/21 0910  BP: 108/74  Pulse: 76  Temp: 97.8 F (36.6 C)  TempSrc: Oral  SpO2: 100%  Weight: 171 lb 3.2 oz (77.7 kg)  Height: 5' 5.5" (1.664 m)    Estimated body mass index is 28.06 kg/m as calculated from the following:   Height as of this encounter: 5' 5.5" (1.664 m).   Weight as of this encounter: 171 lb 3.2 oz (77.7 kg).  _0 @  [facility] Weights   01/19/21 0910  Weight: 171 lb 3.2 oz (77.7 kg)     Physical Exam  General: No distress. Looks well Neuro: Alert and Oriented x 3. GCS 15. Speech normal Psych: Pleasant Resp:  Barrel Chest - no.  Wheeze - no, Crackles - no, No overt respiratory distress CVS: Normal heart sounds. Murmurs - no Ext: Stigmata of Connective Tissue Disease - no HEENT: Normal upper airway. PEERL +. No post nasal drip        Assessment:       ICD-10-CM   1. Chronic respiratory failure with hypoxia (HCC)  J96.11     2. Stage 2 moderate COPD by GOLD classification ([place])  J44.9     3. Alpha-1-antitrypsin deficiency carrier  Z14.8     4. Chronic cough  R05.3          Plan:     Patient Instructions     ICD-10-CM   1. Chronic respiratory failure with hypoxia (HCC)  J96.11     2. Stage 2 moderate COPD by GOLD classification ([place])  J44.9     3. Alpha-1-antitrypsin deficiency carrier  Z14.8     4. Chronic cough  R05.3       Stable disease but still with significant daily cough despite COPD treatment and cough therapy  Plan - Continue Robitussin DM 2 tsp every 4hrs for cough As needed   - continue Tessalon Three times a day  For cough As needed   - continue Chlortrimeton (Chlorpheniramine )  47m At bedtime but can take it as needed instead of scheduled - Continue on Gabapentin but increase it from 3080mTwice daily  to 300 mg 3 times daily - refer voice rehab Mr CaGarald Balding- try 8  day empiric  prednisone -Take prednisone 40 mg daily x 2 days, then 2042maily x 2 days, then 10m30mily x 2 days, then 5mg 52mly x 2 days and stop - Voice rest and sips of water to soothe throat   - Continue on BREZTRI 2 puffs Twice daily  , rinse after use.  - Continue on Protonix Twice daily   Continue on Oxygen 3lm at rest and 5l/m with activity    -Keep  her pulse ox greater than 88% [today was 92% on room air at rest  Follow up with  - [name] or [place] NP 2-3 months weeks and As needed    -If cough not improving, then we should just slowly taper up gabapentin off - Please contact office for sooner follow up if symptoms do not improve or worsen or seek emergency care      SIGNATURE    Dr. Brand Males, M.D., F.C.C.P,  Pulmonary and Critical Care Medicine Staff Physician, [place] Director - Interstitial Lung Disease  Program  Pulmonary [facility] [place], [place], [address]  Pager: [phone], If no answer or between  15:00h - 7:00h: call [phone] Telephone: [phone]  9:31 AM 01/19/2021

## 2021-01-22 ENCOUNTER — Other Ambulatory Visit: Payer: Self-pay

## 2021-01-22 ENCOUNTER — Ambulatory Visit: Payer: BC Managed Care – PPO | Attending: Internal Medicine

## 2021-01-22 DIAGNOSIS — R498 Other voice and resonance disorders: Secondary | ICD-10-CM | POA: Diagnosis present

## 2021-01-22 NOTE — Patient Instructions (Signed)
   Practice your belly breathing 20 times each hour  Put your hand "between the belly button and the bra strap" and look at your hand coming out as you breathe in  Take in your breath all the way to your butt  [facility] office will call you - if you don't hear from them by Wednesday call  [phone]

## 2021-01-22 NOTE — Therapy (Signed)
[facility] [address] [place], [place], [address] Phone: [phone]   Fax:  [phone]  Speech Language Pathology Evaluation  Patient Details  Name: Maria [name] MRN: [id number] Date of Birth: [birth year] Referring Provider (SLP): Ann Lions., MD   Encounter Date: 01/22/2021   End of Session - 01/22/21 1717     Visit Number 1    Number of Visits 17    Date for SLP Re-Evaluation 03/21/21    SLP Start Time 0933    SLP Stop Time  3810    SLP Time Calculation (min) 42 min    Activity Tolerance Patient tolerated treatment well             Past Medical History:  Diagnosis Date   Atherosclerosis 1/0612014   aorta, iliacs and CFA bilaterally no greater than 0-49% - lower arterial doppler.   Blood transfusion without reported diagnosis 2005   Cancer [facility]) Ovarian 1978   Chest pain    COPD (chronic obstructive pulmonary disease) ([place])    Emphysema of lung ([place]) 2019   Oxygen deficiency Sept 2020    Past Surgical History:  Procedure Laterality Date   ABDOMINAL HYSTERECTOMY  1978   CHOLECYSTECTOMY  2010   [place]  Neck 2002    There were no vitals filed for this visit.   Subjective Assessment - 01/22/21 1349     Subjective "It makes me cough when I talk."    Currently in Pain? No/denies                SLP Evaluation [facility] - 01/22/21 1349       SLP Visit Information   SLP Received On 01/22/21    Referring Provider (SLP) Ann Lions., MD    Onset Date post-COVID (August 2020)    Medical Diagnosis Chronic Cough      Subjective   Subjective Pt incr'd gabapentin from 300mg  twice to x3/day and incr'd steroid dosage last week after visit with [name]. Pt notices coughing episodes are just as frequent but "not as deep" as prior to med changes.   Patient/Family Stated Goal Decr coughing frequency, decr severity'length of coughing  episoes      General Information   HPI Pt with COPD and long smoking hx- post COVID developed a chronic cough. 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 Pt is on portable O2; needs to incr O2 flow with exertion.      Prior Functional Status   Cognitive/Linguistic Baseline Within functional limits      Cognition   Overall Cognitive Status Within Functional Limits for tasks assessed      Auditory Comprehension   Overall Auditory Comprehension Appears within functional limits for tasks assessed      Verbal Expression   Overall Verbal Expression Appears within functional limits for tasks assessed      Motor Speech   Overall Motor Speech --   pt coughed 17 times in the 35 minutes evaluation. In the last 6 minutes during practice with abdominal breathing (AB) pt did not cough once.   Respiration --   shallow chest breather in conversation   Phonation Low vocal intensity   intermittent  Education Details basics of VCD, triggers of VCD, symptoms of VCD, abdominal breathing (AB)     Person(s) Educated Patient    Methods Explanation;Demonstration;Verbal cues;Handout    Comprehension Verbalized understanding;Returned demonstration;Verbal cues required;Need further instruction                     SLP Short Term Goals - 01/22/21 1548                SLP SHORT TERM GOAL #1    Title pt will demo AB at rest 80% success in 3 sessions    Time 4    Period Weeks    Status New    Target Date 02/20/21         SLP SHORT TERM GOAL #2    Title pt will report s/sx VCD reduced by 50%    Time 4    Period Weeks    Status New    Target Date 02/20/21         SLP SHORT TERM GOAL #3    Title pt will report use of relaxation techniques reduce s/sx VCD "some of the time" (with "none", "some", "all" as options) in 2 sessions    Time 4     Period Weeks    Status New    Target Date 02/20/21         SLP SHORT TERM GOAL #4    Title pt will demo AB in 16/20 sentence respones, in 2 sessions    Time 4    Period Weeks    Status New    Target Date 02/20/21                     SLP Long Term Goals - 01/22/21 1552                SLP LONG TERM GOAL #1    Title pt will report reduction of s/sx VCD by 75%    Time 8    Period Weeks    Status New    Target Date 03/21/21         SLP LONG TERM GOAL #2    Title pt will converse for 8 minutes in simple conversation using AB 75% of the time in 2 sessions    Time 8    Period Weeks    Status New    Target Date 03/21/21         SLP LONG TERM GOAL #3    Title pt will clear throat twice in a 40-minute session in 3 sessions    Time 8    Period Weeks    Status New    Target Date 03/21/21                            Plan - 01/22/21 1402     Clinical Impression Statement Pt presents today with overt s/sx of vocal cord dysfunction/irritable larynx/cyclical cough (VCD), including frequent cough (17 times in ~35 minutes), and a feeling of tightness in the throat and chest.Most significant sign and symptom of VCD today was couging. As pt spoke, rate of cough and throat clear increased. Pt reports other triggers of VCD as strong odors/scents (bleach, strong sweet-smelling scents, and perfume) exercise/physical activity, and post-nasal drip. SLP discussed pt is mainly a chest breather in conversation, and this fosters VCD. SLP explained nature of abdominal breathing (AB) and how this mitigates s/sx of  VCD in most speakers.Today pt was able to achieve AB with SLP model, education, and direct verbal cues from SLP. She will practice 20 breaths AB every hour and stated she will set her alarm on her watch to accomplish this. Pt will benefit from skilled ST teaching pt how to use abdominal breathing in conversation, as well as training pt in how to use visual imagery for  relaxation of voice tract.    Speech Therapy Frequency 2x / week    Duration 8 weeks   however pt has concern about financial aspect of ST; told pt x2/week x1 week would be helpful, then could maybe decr to once/week for 4 weeks, and this is dependent on if she works at home and how she does achieving SLP goals.   Treatment/Interventions Environmental controls;Compensatory techniques;SLP instruction and feedback;Patient/family education;Internal/external aids    Potential to Achieve Goals Good    SLP Home Exercise Plan provided today (AB)    Consulted and Agree with Plan of Care Patient             Patient will benefit from skilled therapeutic intervention in order to improve the following deficits and impairments:   Other voice and resonance disorders - Plan: SLP plan of care cert/re-cert    Problem List Patient Active Problem List   Diagnosis Date Noted   Chronic cough 12/11/2020   Thrombosed external hemorrhoids 10/01/2020   Prediabetes 09/30/2019   Tear of left gluteus minimus tendon 09/30/2019   Aortic atherosclerosis ([place]) 09/30/2019   Emphysema due to alpha-1-antitrypsin deficiency ([place]) 09/16/2019   Cheilitis 08/27/2019   Other microscopic hematuria 08/27/2019   Bronchiectasis without complication ([place]) 78/67/5449   Chronic respiratory failure with hypoxia ([place]) 04/09/2019   Pneumonia due to COVID-19 virus 02/27/2019   COVID-19 virus infection 02/26/2019   Stage 2 moderate COPD by GOLD classification ([place]) 02/26/2019   Syncope 02/26/2019   Orthostatic hypotension 02/26/2019    Jailey Booton ,[place], [place]  01/22/2021, 5:17 PM  [facility] [address] [place], [place], [address] Phone: [phone]   Fax:  [phone]  Name: UGOCHI HENZLER MRN: [id number] Date of Birth: 1956-07-12

## 2021-01-24 ENCOUNTER — Other Ambulatory Visit: Payer: Self-pay | Admitting: Internal Medicine

## 2021-01-24 ENCOUNTER — Other Ambulatory Visit: Payer: Self-pay | Admitting: Family Medicine

## 2021-01-24 DIAGNOSIS — I700 Atherosclerosis of aorta: Secondary | ICD-10-CM

## 2021-02-20 ENCOUNTER — Other Ambulatory Visit: Payer: Self-pay | Admitting: Family Medicine

## 2021-02-20 ENCOUNTER — Other Ambulatory Visit: Payer: Self-pay | Admitting: Adult Health

## 2021-02-20 DIAGNOSIS — I700 Atherosclerosis of aorta: Secondary | ICD-10-CM

## 2021-02-22 ENCOUNTER — Other Ambulatory Visit: Payer: Self-pay

## 2021-02-22 ENCOUNTER — Ambulatory Visit: Payer: BC Managed Care – PPO | Attending: Internal Medicine | Admitting: Speech Pathology

## 2021-02-22 ENCOUNTER — Telehealth: Payer: Self-pay | Admitting: Internal Medicine

## 2021-02-22 DIAGNOSIS — R053 Chronic cough: Secondary | ICD-10-CM | POA: Insufficient documentation

## 2021-02-22 DIAGNOSIS — R498 Other voice and resonance disorders: Secondary | ICD-10-CM | POA: Diagnosis present

## 2021-02-22 MED ORDER — BENZONATATE 100 MG PO CAPS: 100.0000 mg | ORAL_CAPSULE | Freq: Three times a day (TID) | ORAL | 1 refills | Status: DC | PRN

## 2021-02-22 NOTE — Telephone Encounter (Signed)
That is fine , Tessalon '100mg'$  Three times a day  As needed  cough

## 2021-02-22 NOTE — Telephone Encounter (Signed)
Called and spoke with Patient.  Tammy, NP recommendations given.  Tessalon '100mg'$  prescription sent to requested [facility].  Nothing further at this time.

## 2021-02-22 NOTE — Telephone Encounter (Signed)
Call returned to patient, confirmed DOB. Patient has changed insurance and only '100mg'$  tessalon is covered. She is requesting a refill. She reports she is still coughing and uses the tessalon sparingly.   TP please advise if willing to refill. Patient states you are familiar with her.

## 2021-02-22 NOTE — Telephone Encounter (Signed)
Pt stated that she has changed insurances and stated that for the medication; benzonatate; not accepted for 200 MG only 100 MG. Pt stated that she is needing for it to be updated so that it will be covered with her insurance and for [name] to contact [facility].  Pls regard; [phone]

## 2021-02-22 NOTE — Therapy (Signed)
[place] MAIN [facility] SERVICES [address] [place], [place], [address] Phone: [phone]   Fax:  [phone]  Speech Language Pathology Treatment  Patient Details  Name: Maria [name] MRN: [id number] Date of Birth: 07-May-1956 Referring Provider (SLP): Ann Lions., MD   Encounter Date: 02/22/2021   End of Session - 02/22/21 1631     Visit Number 2    Number of Visits 17    Date for SLP Re-Evaluation 03/21/21    SLP Start Time 1015   pt arrived late; went to wrong location   SLP Stop Time  1100    SLP Time Calculation (min) 45 min    Activity Tolerance Patient tolerated treatment well             Past Medical History:  Diagnosis Date   Atherosclerosis 1/0612014   aorta, iliacs and CFA bilaterally no greater than 0-49% - lower arterial doppler.   Blood transfusion without reported diagnosis 2005   Cancer [facility]) Ovarian 1978   Chest pain    COPD (chronic obstructive pulmonary disease) ([place])    Emphysema of lung ([place]) 2019   Oxygen deficiency Sept 2020    Past Surgical History:  Procedure Laterality Date   ABDOMINAL HYSTERECTOMY  1978   CHOLECYSTECTOMY  2010   [place]  Neck 2002    There were no vitals filed for this visit.   Subjective Assessment - 02/22/21 1625     Subjective Reports some cold-like symptoms since Friday    Currently in Pain? No/denies                   ADULT SLP TREATMENT - 02/22/21 1626       General Information   Behavior/Cognition Alert;Cooperative;Pleasant mood    HPI Pt with COPD and long smoking hx- post COVID developed a chronic cough. 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 Pt is on portable O2; needs to incr O2 flow with exertion.      Treatment Provided   Treatment provided  Cognitive-Linquistic      Pain Assessment   Pain Assessment No/denies pain      Cognitive-Linquistic Treatment   Treatment focused on Other (comment)   chronic cough   Skilled Treatment SLP educated pt on potential triggers for cough as well as prevention/management strategies. She is sipping water/dry swallowing, and has been trying to practice abdominal breathing. "I tried to hold my breath but it doesn't help." Coughing instances 10x in the first 20 minutes of session. Reviewed abdominal breathing trained at eval; pt benefitted from use of mirror for visual feedback as well as elevating her legs in a chair for training while partially reclined. Occasional mod cues for abdominal vs chest movement and slower rate. Trained in pursed lip breathing, exhale through straw. Pt continued to have coughing episodes, but at much decreased frequency when using these strategies (5 instances in remaining 25 minutes of session).      Assessment / Recommendations / Plan   Plan Continue with current plan of care      Progression Toward Goals   Progression toward goals Progressing toward goals              SLP Education - 02/22/21 1630  Education Details abdominal breathing, vocal hygiene, pursed lip and straw breathing    Person(s) Educated Patient    Methods Explanation    Comprehension Verbalized understanding;Need further instruction                  Plan - 02/22/21 1631     Clinical Impression Statement Pt presents today with overt s/sx of vocal cord dysfunction/irritable larynx/cyclical cough (VCD), including frequent cough (15 times in ~45 minutes), and a feeling of tightness in the throat and chest. Most significant sign and symptom of VCD today was coughing. As pt spoke, rate of cough and throat clear increased. Pt reports other triggers of VCD as strong odors/scents (bleach, strong sweet-smelling scents, and perfume) exercise/physical activity, and post-nasal drip. Visual cues  and modification of position (to partially reclined) were beneficial for training in abdominal breathing today. Pt reports she will practice this while in her recliner. Pt will benefit from skilled ST teaching pt how to use abdominal breathing in conversation, as well as training pt in how to use visual imagery for relaxation of voice tract.    Speech Therapy Frequency 2x / week    Duration 8 weeks   however pt has concern about financial aspect of ST; told pt x2/week x1 week would be helpful, then could maybe decr to once/week for 4 weeks, and this is dependent on if she works at home and how she does achieving SLP goals.   Treatment/Interventions Environmental controls;Compensatory techniques;SLP instruction and feedback;Patient/family education;Internal/external aids    Potential to Achieve Goals Good    SLP Home Exercise Plan provided today (AB)    Consulted and Agree with Plan of Care Patient             Patient will benefit from skilled therapeutic intervention in order to improve the following deficits and impairments:   Other voice and resonance disorders    Problem List Patient Active Problem List   Diagnosis Date Noted   Chronic cough 12/11/2020   Thrombosed external hemorrhoids 10/01/2020   Prediabetes 09/30/2019   Tear of left gluteus minimus tendon 09/30/2019   Aortic atherosclerosis ([place]) 09/30/2019   Emphysema due to alpha-1-antitrypsin deficiency ([place]) 09/16/2019   Cheilitis 08/27/2019   Other microscopic hematuria 08/27/2019   Bronchiectasis without complication ([place]) [id number]   Chronic respiratory failure with hypoxia ([place]) 04/09/2019   Pneumonia due to COVID-19 virus 02/27/2019   COVID-19 virus infection 02/26/2019   Stage 2 moderate COPD by GOLD classification ([place]) 02/26/2019   Syncope 02/26/2019   Orthostatic hypotension 02/26/2019   Deneise Lever, [place], CCC-SLP Speech-Language Pathologist  Aliene Altes 02/22/2021, 4:34 PM  [place] MAIN [facility] SERVICES [address] [place], [place], [address] Phone: [phone]   Fax:  [phone]   Name: ALISHBA LITZENBERG MRN: [id number] Date of Birth: 1956/05/22

## 2021-02-25 ENCOUNTER — Other Ambulatory Visit: Payer: Self-pay

## 2021-02-25 ENCOUNTER — Ambulatory Visit: Payer: BC Managed Care – PPO | Admitting: Speech Pathology

## 2021-02-25 DIAGNOSIS — R498 Other voice and resonance disorders: Secondary | ICD-10-CM | POA: Diagnosis not present

## 2021-02-25 NOTE — Therapy (Signed)
[place] MAIN [facility] SERVICES [address] [place], [place], [address] Phone: [phone]   Fax:  [phone]  Speech Language Pathology Treatment  Patient Details  Name: Maria [name] MRN: [id number] Date of Birth: 07-Dec-1955 Referring Provider (SLP): Ann Lions., MD   Encounter Date: 02/25/2021   End of Session - 02/25/21 1433     Visit Number 3    Number of Visits 17    Date for SLP Re-Evaluation 03/21/21    SLP Start Time [id number]    SLP Stop Time  1400    SLP Time Calculation (min) 55 min    Activity Tolerance Patient tolerated treatment well             Past Medical History:  Diagnosis Date   Atherosclerosis 1/0612014   aorta, iliacs and CFA bilaterally no greater than 0-49% - lower arterial doppler.   Blood transfusion without reported diagnosis 2005   Cancer [facility]) Ovarian 1978   Chest pain    COPD (chronic obstructive pulmonary disease) ([place])    Emphysema of lung ([place]) 2019   Oxygen deficiency Sept 2020    Past Surgical History:  Procedure Laterality Date   ABDOMINAL HYSTERECTOMY  1978   CHOLECYSTECTOMY  2010   [place]  Neck 2002    There were no vitals filed for this visit.   Subjective Assessment - 02/25/21 1313     Subjective "You'll have to give me a minute." out of breath after walking down hallway    Currently in Pain? Yes    Pain Score 1     Pain Location Head                   ADULT SLP TREATMENT - 02/25/21 1314       General Information   Behavior/Cognition Alert;Cooperative;Pleasant mood    HPI Pt with COPD and long smoking hx- post COVID developed a chronic cough. 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 Pt is on portable O2; needs to incr O2 flow with exertion.      Treatment Provided    Treatment provided Cognitive-Linquistic      Cognitive-Linquistic Treatment   Treatment focused on Other (comment)   Chronic cough   Skilled Treatment Patient reports placing her tongue along alveolar ridge and using this cue at rest to remind her of abdominal breathing. Reports significant reduction in the "tickle cough" over the last 2 days. Pt coughed twice when ambulating down hallway. Requested a moment to catch her breath, subsequently using more abdominal centered breathing, more frequent breaths in conversation than previous session. Total coughing episodes in treatment room reduced to 4 in 55 minutes (from 15 previous session in 45 minutes). Coughs were on 3 occasions triggered by laughter, once when noted to speak on residual capacity. Education on cough physiology, vagus nerve, and role of abdominal breathing. Encouraged pt when she notes coughing in conversation, to take 3-5 slow abdominal breaths before resuming conversation.      Assessment / Recommendations / Plan   Plan Continue with current plan of care      Progression Toward Goals   Progression toward goals Progressing toward goals  SLP Education - 02/25/21 1433     Education Details abdominal breathing, cough physiology    Person(s) Educated Patient    Methods Explanation    Comprehension Verbalized understanding                  Plan - 02/25/21 1434     Clinical Impression Statement Reduced s/sx vocal cord dysfunction/irritable larynx/cyclical cough (VCD) with abdominal breathing; only 4 instances in 55 minute session. Most significant sign and symptom of VCD today was coughing. Laughter was noted to be a trigger for cough. Continue ST and training for use of abdominal breathing in conversation, as well as training pt in how to use visual imagery for relaxation of voice tract.    Speech Therapy Frequency 2x / week    Duration 8 weeks   however pt has concern about financial aspect of ST; told  pt x2/week x1 week would be helpful, then could maybe decr to once/week for 4 weeks, and this is dependent on if she works at home and how she does achieving SLP goals.   Treatment/Interventions Environmental controls;Compensatory techniques;SLP instruction and feedback;Patient/family education;Internal/external aids    Potential to Achieve Goals Good    SLP Home Exercise Plan provided today (AB)    Consulted and Agree with Plan of Care Patient             Patient will benefit from skilled therapeutic intervention in order to improve the following deficits and impairments:   Other voice and resonance disorders    Problem List Patient Active Problem List   Diagnosis Date Noted   Chronic cough 12/11/2020   Thrombosed external hemorrhoids 10/01/2020   Prediabetes 09/30/2019   Tear of left gluteus minimus tendon 09/30/2019   Aortic atherosclerosis ([place]) 09/30/2019   Emphysema due to alpha-1-antitrypsin deficiency ([place]) 09/16/2019   Cheilitis 08/27/2019   Other microscopic hematuria 08/27/2019   Bronchiectasis without complication ([place]) [id number]   Chronic respiratory failure with hypoxia ([place]) 04/09/2019   Pneumonia due to COVID-19 virus 02/27/2019   COVID-19 virus infection 02/26/2019   Stage 2 moderate COPD by GOLD classification ([place]) 02/26/2019   Syncope 02/26/2019   Orthostatic hypotension 02/26/2019   Deneise Lever, [place], CCC-SLP Speech-Language Pathologist  Aliene Altes 02/25/2021, 2:36 PM  [place] MAIN [facility] SERVICES [address] [place], [place], [address] Phone: [phone]   Fax:  [phone]   Name: Maria [name] MRN: [id number] Date of Birth: Aug 19, 1955

## 2021-03-01 ENCOUNTER — Ambulatory Visit: Payer: BC Managed Care – PPO | Admitting: Speech Pathology

## 2021-03-04 ENCOUNTER — Ambulatory Visit: Payer: BC Managed Care – PPO | Admitting: Speech Pathology

## 2021-03-04 ENCOUNTER — Other Ambulatory Visit: Payer: Self-pay

## 2021-03-04 DIAGNOSIS — R498 Other voice and resonance disorders: Secondary | ICD-10-CM | POA: Diagnosis not present

## 2021-03-04 DIAGNOSIS — R053 Chronic cough: Secondary | ICD-10-CM

## 2021-03-04 NOTE — Therapy (Signed)
[place] MAIN [facility] SERVICES [address] [place], [place], [address] Phone: [phone]   Fax:  [phone]  Speech Language Pathology Treatment  Patient Details  Name: Maria [name] MRN: [id number] Date of Birth: May 30, 1956 Referring Provider (SLP): Ann Lions., MD   Encounter Date: 03/04/2021   End of Session - 03/04/21 1701     Visit Number 4    Number of Visits 17    Date for SLP Re-Evaluation 03/21/21    SLP Start Time 1105    SLP Stop Time  1200    SLP Time Calculation (min) 55 min    Activity Tolerance Patient tolerated treatment well             Past Medical History:  Diagnosis Date   Atherosclerosis 1/0612014   aorta, iliacs and CFA bilaterally no greater than 0-49% - lower arterial doppler.   Blood transfusion without reported diagnosis 2005   Cancer [facility]) Ovarian 1978   Chest pain    COPD (chronic obstructive pulmonary disease) ([place])    Emphysema of lung ([place]) 2019   Oxygen deficiency Sept 2020    Past Surgical History:  Procedure Laterality Date   ABDOMINAL HYSTERECTOMY  1978   CHOLECYSTECTOMY  2010   [place]  Neck 2002    There were no vitals filed for this visit.   Subjective Assessment - 03/04/21 1112     Subjective "I'm the closest to normal I've been in months."                   ADULT SLP TREATMENT - 03/04/21 1657       General Information   Behavior/Cognition Alert;Cooperative;Pleasant mood    HPI Pt with COPD and long smoking hx- post COVID developed a chronic cough. 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 Pt is on portable O2; needs to incr O2 flow with exertion.      Treatment Provided   Treatment provided Cognitive-Linquistic      Cognitive-Linquistic Treatment   Treatment  focused on Other (comment)   chronic cough   Skilled Treatment Patient self-monitored for abdominal breathing and used strategies (sipping water, relaxation, breathing) for cough suppression during conversation. Only 3 brief coughing episodes this session. Discussed plan and will decrease frequency to 1x per week; pt going out of town next week and is to return in 2 weeks.      Assessment / Recommendations / Plan   Plan Other (Comment)   decrease frequency due to progress     Progression Toward Goals   Progression toward goals Progressing toward goals                    Plan - 03/04/21 1701     Clinical Impression Statement Continues to improve s/sx vocal cord dysfunction/irritable larynx/cyclical cough (VCD) with abdominal breathing; only 3 instances in 55 minute session. Patient self-monitoring in conversation. Will reduce frequency to 1x per week; likely d/c 1-2 visits if pt maintaining carryover and vocal function. Continue ST and training for use of abdominal breathing in conversation, as well as training pt in how to use visual imagery for relaxation of voice tract.    Speech Therapy Frequency 2x / week  Duration 8 weeks   however pt has concern about financial aspect of ST; told pt x2/week x1 week would be helpful, then could maybe decr to once/week for 4 weeks, and this is dependent on if she works at home and how she does achieving SLP goals.   Treatment/Interventions Environmental controls;Compensatory techniques;SLP instruction and feedback;Patient/family education;Internal/external aids    Potential to Achieve Goals Good    SLP Home Exercise Plan provided today (AB)    Consulted and Agree with Plan of Care Patient             Patient will benefit from skilled therapeutic intervention in order to improve the following deficits and impairments:   Chronic cough    Problem List Patient Active Problem List   Diagnosis Date Noted   Chronic cough 12/11/2020    Thrombosed external hemorrhoids 10/01/2020   Prediabetes 09/30/2019   Tear of left gluteus minimus tendon 09/30/2019   Aortic atherosclerosis ([place]) 09/30/2019   Emphysema due to alpha-1-antitrypsin deficiency ([place]) 09/16/2019   Cheilitis 08/27/2019   Other microscopic hematuria 08/27/2019   Bronchiectasis without complication ([place]) [id number]   Chronic respiratory failure with hypoxia ([place]) 04/09/2019   Pneumonia due to COVID-19 virus 02/27/2019   COVID-19 virus infection 02/26/2019   Stage 2 moderate COPD by GOLD classification ([place]) 02/26/2019   Syncope 02/26/2019   Orthostatic hypotension 02/26/2019   Deneise Lever, [place], CCC-SLP Speech-Language Pathologist  Aliene Altes 03/04/2021, 5:02 PM  [facility] [address] [place], [place], [address] Phone: [phone]   Fax:  [phone]   Name: Maria [name] MRN: [id number] Date of Birth: [birth year]

## 2021-03-08 ENCOUNTER — Ambulatory Visit: Payer: BC Managed Care – PPO | Admitting: Speech Pathology

## 2021-03-11 ENCOUNTER — Encounter: Payer: BC Managed Care – PPO | Admitting: Speech Pathology

## 2021-03-15 ENCOUNTER — Other Ambulatory Visit: Payer: Self-pay

## 2021-03-15 ENCOUNTER — Ambulatory Visit: Payer: BC Managed Care – PPO | Admitting: Family Medicine

## 2021-03-15 ENCOUNTER — Encounter: Payer: BC Managed Care – PPO | Admitting: Speech Pathology

## 2021-03-15 VITALS — BP 100/72 | HR 63 | Temp 97.6°F | Wt 169.5 lb

## 2021-03-15 DIAGNOSIS — H93A2 Pulsatile tinnitus, left ear: Secondary | ICD-10-CM | POA: Diagnosis not present

## 2021-03-15 DIAGNOSIS — R350 Frequency of micturition: Secondary | ICD-10-CM

## 2021-03-15 DIAGNOSIS — N393 Stress incontinence (female) (male): Secondary | ICD-10-CM

## 2021-03-15 DIAGNOSIS — Z23 Encounter for immunization: Secondary | ICD-10-CM | POA: Diagnosis not present

## 2021-03-15 LAB — POC URINALSYSI DIPSTICK (AUTOMATED)
Bilirubin, UA: NEGATIVE
Blood, UA: POSITIVE
Glucose, UA: NEGATIVE
Ketones, UA: NEGATIVE
Leukocytes, UA: NEGATIVE
Nitrite, UA: NEGATIVE
Protein, UA: NEGATIVE
Spec Grav, UA: 1.015 (ref 1.010–1.025)
Urobilinogen, UA: 0.2 E.U./dL
pH, UA: 5.5 (ref 5.0–8.0)

## 2021-03-15 LAB — URINALYSIS, MICROSCOPIC ONLY: RBC / HPF: NONE SEEN (ref 0–?)

## 2021-03-15 NOTE — Assessment & Plan Note (Signed)
Chronic cough and did try pelvic PT w/o significant improvement. Suspect some prolapse given difficulty emptying symptoms as well. Referral to urology to evaluate.

## 2021-03-15 NOTE — Progress Notes (Signed)
Subjective:     Maria [name] is a 65 y.o. female presenting for Urinary Frequency (X 1 week With an odor and dark color), Dysuria (On and off x 1 week), and Tinnitus (L ear x 2 weeks )     Urinary Frequency  Associated symptoms include frequency.  Dysuria  Associated symptoms include frequency.   #Dysuria - drinking lots of water - dysuria on and off - sometimes burning and sometimes not - stress incontinence w/ coughing - endorses some difficulty emptying - this has been ongoing for while - frequency x 1 week - waxing and waning   #tinnitus - left side - no hx of sinus symptoms - 2 weeks - some pain with turning - no congestion, sinus pressure - constant runny nose - nothing new - will also get a rubbing nose  - pulsing ringing - like the heartbeat   Review of Systems  Genitourinary:  Positive for dysuria and frequency.    Social History   Tobacco Use  Smoking Status Former   Packs/day: 1.00   Years: 45.00   Pack years: 45.00   Types: Cigarettes   Quit date: 03/18/2016   Years since quitting: 4.9  Smokeless Tobacco Never        Objective:    BP Readings from Last 3 Encounters:  03/15/21 100/72  01/19/21 108/74  12/11/20 130/60   Wt Readings from Last 3 Encounters:  03/15/21 169 lb 8 oz (76.9 kg)  01/19/21 171 lb 3.2 oz (77.7 kg)  12/11/20 172 lb 9.6 oz (78.3 kg)    BP 100/72   Pulse 63   Temp 97.6 F (36.4 C) (Temporal)   Wt 169 lb 8 oz (76.9 kg)   SpO2 100%   BMI 27.78 kg/m    Physical Exam Constitutional:      General: She is not in acute distress.    Appearance: She is well-developed. She is not diaphoretic.  HENT:     Head: Normocephalic and atraumatic.     Right Ear: Ear canal and external ear normal. No middle ear effusion.     Left Ear: Ear canal and external ear normal.  No middle ear effusion. Tympanic membrane is scarred. Tympanic membrane is not perforated, erythematous or retracted.     Nose: Nose normal. No  congestion.     Mouth/Throat:     Mouth: Mucous membranes are moist.     Pharynx: No posterior oropharyngeal erythema.  Eyes:     Extraocular Movements: Extraocular movements intact.     Conjunctiva/sclera: Conjunctivae normal.  Neck:     Vascular: Carotid bruit (left side) present.  Cardiovascular:     Rate and Rhythm: Normal rate and regular rhythm.  Pulmonary:     Effort: Pulmonary effort is normal.     Comments: Breathing comfortably on oxygen Musculoskeletal:     Cervical back: Neck supple.  Skin:    General: Skin is warm and dry.     Capillary Refill: Capillary refill takes less than 2 seconds.  Neurological:     Mental Status: She is alert. Mental status is at baseline.  Psychiatric:        Mood and Affect: Mood normal.        Behavior: Behavior normal.          Assessment & Plan:   Problem List Items Addressed This Visit       Nervous and Auditory   Pulsatile tinnitus of left ear    Ear drum w/o fluid.  Trial of flonase and given bruit will get [name] to evaluate blood vessels. If no cause and persisting will plan ENT referral.       Relevant Orders   VAS US CAROTID     Other   Stress incontinence    Chronic cough and did try pelvic PT w/o significant improvement. Suspect some prolapse given difficulty emptying symptoms as well. Referral to urology to evaluate.       Relevant Orders   Ambulatory referral to Urology   Urinary frequency - Primary    UA with blood but no infectious signs. Given waxing and waning symptoms will send for culture and micro to further assess blood.       Relevant Orders   POCT Urinalysis Dipstick (Automated) (Completed)   Urine Culture   Urine Microscopic   Other Visit Diagnoses     Need for influenza vaccination       Relevant Orders   Flu Vaccine QUAD 26moIM (Fluarix, Fluzone & Alfiuria Quad PF)        Return if symptoms worsen or fail to improve.  JLesleigh Noe MD  This visit occurred during the SARS-CoV-2  public health emergency.  Safety protocols were in place, including screening questions prior to the visit, additional usage of staff PPE, and extensive cleaning of exam room while observing appropriate contact time as indicated for disinfecting solutions.

## 2021-03-15 NOTE — Patient Instructions (Signed)
Will send culture  Ear - try flonase - decongestants to see if that helps - ultrasound of neck - if worsening and ultrasound normal will have you see ENT  Urinary  #Referral I have placed a referral to a specialist for you. You should receive a phone call from the specialty office. Make sure your voicemail is not full and that if you are able to answer your phone to unknown or new numbers.   It may take up to 2 weeks to hear about the referral. If you do not hear anything in 2 weeks, please call our office and ask to speak with the referral coordinator.

## 2021-03-15 NOTE — Assessment & Plan Note (Signed)
UA with blood but no infectious signs. Given waxing and waning symptoms will send for culture and micro to further assess blood.

## 2021-03-15 NOTE — Assessment & Plan Note (Signed)
Ear drum w/o fluid. Trial of flonase and given bruit will get [name] to evaluate blood vessels. If no cause and persisting will plan ENT referral.

## 2021-03-16 LAB — URINE CULTURE
MICRO NUMBER:: 12303641
Result:: NO GROWTH
SPECIMEN QUALITY:: ADEQUATE

## 2021-03-18 ENCOUNTER — Other Ambulatory Visit: Payer: Self-pay

## 2021-03-18 ENCOUNTER — Ambulatory Visit: Payer: 59 | Attending: Internal Medicine | Admitting: Speech Pathology

## 2021-03-18 DIAGNOSIS — R053 Chronic cough: Secondary | ICD-10-CM

## 2021-03-18 DIAGNOSIS — R498 Other voice and resonance disorders: Secondary | ICD-10-CM | POA: Diagnosis present

## 2021-03-18 NOTE — Therapy (Signed)
[place] MAIN [facility] SERVICES [address] [place], [place], [address] Phone: [phone]   Fax:  [phone]  Speech Language Pathology Treatment and Discharge Summary  Patient Details  Name: Maria [name] MRN: [id number] Date of Birth: Sep 04, 1955 Referring Provider (SLP): Ann Lions., MD   Encounter Date: 03/18/2021   End of Session - 03/18/21 1306     Visit Number 5    Number of Visits 17    Date for SLP Re-Evaluation 03/21/21    SLP Start Time 1110    SLP Stop Time  1140    SLP Time Calculation (min) 30 min    Activity Tolerance Patient tolerated treatment well             Past Medical History:  Diagnosis Date   Atherosclerosis 1/0612014   aorta, iliacs and CFA bilaterally no greater than 0-49% - lower arterial doppler.   Blood transfusion without reported diagnosis 2005   Cancer [facility]) Ovarian 1978   Chest pain    COPD (chronic obstructive pulmonary disease) ([place])    Emphysema of lung ([place]) 2019   Oxygen deficiency Sept 2020    Past Surgical History:  Procedure Laterality Date   ABDOMINAL HYSTERECTOMY  1978   CHOLECYSTECTOMY  2010   [place]  Neck 2002    There were no vitals filed for this visit.   Subjective Assessment - 03/18/21 1115     Subjective "They're working." re; strategies    Currently in Pain? No/denies                   ADULT SLP TREATMENT - 03/18/21 0001       General Information   Behavior/Cognition Alert;Cooperative;Pleasant mood    HPI Pt with COPD and long smoking hx- post COVID developed a chronic cough. 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 Pt is on portable O2; needs to incr O2 flow with exertion.      Cognitive-Linquistic Treatment   Treatment focused on Other (comment)    chronic cough   Skilled Treatment Patient reports she is continuing to manage/suppress cough using breathing strategies.  Utilized relaxation, breathing, and guided imagery in session today with no coughing episodes. Patient reports confidence in being able to maintain these strategies on her own. In agreement with d/c today.      Assessment / Recommendations / Plan   Plan Other (Comment)   decrease frequency due to progress     Progression Toward Goals   Progression toward goals Goals met, education completed, patient discharged from [facility] - 03/18/21 1305     Education Details resources for guided imagery              SLP Short Term Goals - 03/18/21 1309       SLP SHORT TERM GOAL #1   Title pt will demo AB at rest 80% success in 3 sessions    Status Achieved      SLP SHORT TERM GOAL #2   Title pt will report s/sx VCD reduced by 50%    Status Achieved      SLP SHORT TERM GOAL #3   Title  pt will report use of relaxation techniques reduce s/sx VCD "some of the time" (with "none", "some", "all" as options) in 2 sessions    Status Achieved      SLP SHORT TERM GOAL #4   Title pt will demo AB in 16/20 sentence respones, in 2 sessions    Status Achieved              SLP Long Term Goals - 03/18/21 [place] #1   Title pt will report reduction of s/sx VCD by 75%    Time 8    Period Weeks    Status Achieved      SLP LONG TERM GOAL #2   Title pt will converse for 8 minutes in simple conversation using AB 75% of the time in 2 sessions    Time 8    Period Weeks    Status Achieved      SLP LONG TERM GOAL #3   Title pt will clear throat twice in a 40-minute session in 3 sessions    Time 8    Period Weeks    Status Achieved              Plan - 03/18/21 1306     Clinical Impression Statement Patient is independently managing s/sx vocal cord dysfunction/irritable larynx/cyclical cough (VCD) with abdominal  breathing; no coughing episodes today. Patient self-monitoring in conversation. Patient has achieved LTGs and is in agreement with d/c from [place] today.    Speech Therapy Frequency --   d/c   Duration --   d/c   Treatment/Interventions Environmental controls;Compensatory techniques;SLP instruction and feedback;Patient/family education;Internal/external aids    Potential to Achieve Goals Good    SLP Home Exercise Plan provided today (AB)    Consulted and Agree with Plan of Care Patient             Patient will benefit from skilled therapeutic intervention in order to improve the following deficits and impairments:   Chronic cough  Other voice and resonance disorders    Problem List Patient Active Problem List   Diagnosis Date Noted   Stress incontinence 03/15/2021   Urinary frequency 03/15/2021   Pulsatile tinnitus of left ear 03/15/2021   Chronic cough 12/11/2020   Thrombosed external hemorrhoids 10/01/2020   Prediabetes 09/30/2019   Tear of left gluteus minimus tendon 09/30/2019   Aortic atherosclerosis ([place]) 09/30/2019   Emphysema due to alpha-1-antitrypsin deficiency ([place]) 09/16/2019   Cheilitis 08/27/2019   Other microscopic hematuria 08/27/2019   Bronchiectasis without complication ([place]) 29/51/8841   Chronic respiratory failure with hypoxia ([place]) 04/09/2019   Pneumonia due to COVID-19 virus 02/27/2019   COVID-19 virus infection 02/26/2019   Stage 2 moderate COPD by GOLD classification ([place]) 02/26/2019   Syncope 02/26/2019   Orthostatic hypotension 02/26/2019   SPEECH THERAPY DISCHARGE SUMMARY  Visits from Start of Care: 5  Current functional level related to goals / functional outcomes: No throat clears or coughing episode during 30 minute session today; reports she is suppressing her "tickle" cough close to 100% of the time.   Remaining deficits: Occasional cough due to COPD   Education / Equipment: Breathing and relaxation strategies   Patient agrees to  discharge. Patient goals were met. Patient is being discharged due to meeting the stated rehab goals.Deneise Lever, [place], [place] Speech-Language Pathologist   Aliene Altes 03/18/2021, 1:11 PM  [facility]  MAIN [facility] SERVICES [place], [place], [address] Phone: [phone]   Fax:  [phone]   Name: MICHON KACZMAREK MRN: [id number] Date of Birth: 05/05/1956

## 2021-03-20 ENCOUNTER — Other Ambulatory Visit: Payer: Self-pay | Admitting: Internal Medicine

## 2021-03-23 ENCOUNTER — Encounter: Payer: BC Managed Care – PPO | Admitting: Speech Pathology

## 2021-03-25 ENCOUNTER — Encounter: Payer: BC Managed Care – PPO | Admitting: Speech Pathology

## 2021-03-29 ENCOUNTER — Encounter: Payer: BC Managed Care – PPO | Admitting: Speech Pathology

## 2021-04-01 ENCOUNTER — Encounter: Payer: BC Managed Care – PPO | Admitting: Speech Pathology

## 2021-04-05 ENCOUNTER — Encounter: Payer: BC Managed Care – PPO | Admitting: Speech Pathology

## 2021-04-08 ENCOUNTER — Encounter: Payer: BC Managed Care – PPO | Admitting: Speech Pathology

## 2021-04-12 ENCOUNTER — Other Ambulatory Visit: Payer: Self-pay

## 2021-04-12 ENCOUNTER — Encounter: Payer: Self-pay | Admitting: Urology

## 2021-04-12 ENCOUNTER — Ambulatory Visit (INDEPENDENT_AMBULATORY_CARE_PROVIDER_SITE_OTHER): Payer: 59 | Admitting: Urology

## 2021-04-12 ENCOUNTER — Encounter: Payer: BC Managed Care – PPO | Admitting: Speech Pathology

## 2021-04-12 ENCOUNTER — Encounter: Payer: Self-pay | Admitting: Family Medicine

## 2021-04-12 VITALS — BP 158/77 | HR 77 | Ht 65.5 in | Wt 163.0 lb

## 2021-04-12 DIAGNOSIS — R32 Unspecified urinary incontinence: Secondary | ICD-10-CM | POA: Diagnosis not present

## 2021-04-12 DIAGNOSIS — N3946 Mixed incontinence: Secondary | ICD-10-CM

## 2021-04-12 LAB — URINALYSIS, COMPLETE
Bilirubin, UA: NEGATIVE
Glucose, UA: NEGATIVE
Ketones, UA: NEGATIVE
Leukocytes,UA: NEGATIVE
Nitrite, UA: NEGATIVE
Protein,UA: NEGATIVE
Specific Gravity, UA: 1.01 (ref 1.005–1.030)
Urobilinogen, Ur: 0.2 mg/dL (ref 0.2–1.0)
pH, UA: 6 (ref 5.0–7.5)

## 2021-04-12 LAB — MICROSCOPIC EXAMINATION

## 2021-04-12 MED ORDER — MIRABEGRON ER 25 MG PO TB24: 25.0000 mg | ORAL_TABLET | Freq: Every day | ORAL | 0 refills | Status: DC

## 2021-04-12 MED ORDER — MIRABEGRON ER 50 MG PO TB24: 50.0000 mg | ORAL_TABLET | Freq: Every day | ORAL | 0 refills | Status: DC

## 2021-04-12 NOTE — Patient Instructions (Signed)
Cystoscopy Cystoscopy is a procedure that is used to help diagnose and sometimes treat conditions that affect the lower urinary tract. The lower urinary tract includes the bladder and the urethra. The urethra is the tube that drains urine from the bladder. Cystoscopy is done using a thin, tube-shaped instrument with a light and camera at the end (cystoscope). The cystoscope may be hard or flexible, depending on the goal of the procedure. The cystoscope is inserted through the urethra, into the bladder. Cystoscopy may be recommended if you have: Urinary tract infections that keep coming back. Blood in the urine (hematuria). An inability to control when you urinate (urinary incontinence) or an overactive bladder. Unusual cells found in a urine sample. A blockage in the urethra, such as a urinary stone. Painful urination. An abnormality in the bladder found during an intravenous pyelogram (IVP) or CT scan. Cystoscopy may also be done to remove a sample of tissue to be examined under a microscope (biopsy). What are the risks? Generally, this is a safe procedure. However, problems may occur, including: Infection. Bleeding.  What happens during the procedure?  You will be given one or more of the following: A medicine to numb the area (local anesthetic). The area around the opening of your urethra will be cleaned. The cystoscope will be passed through your urethra into your bladder. Germ-free (sterile) fluid will flow through the cystoscope to fill your bladder. The fluid will stretch your bladder so that your health care provider can clearly examine your bladder walls. Your doctor will look at the urethra and bladder. The cystoscope will be removed The procedure may vary among health care providers  What can I expect after the procedure? After the procedure, it is common to have: Some soreness or pain in your abdomen and urethra. Urinary symptoms. These include: Mild pain or burning when you  urinate. Pain should stop within a few minutes after you urinate. This may last for up to 1 week. A small amount of blood in your urine for several days. Feeling like you need to urinate but producing only a small amount of urine. Follow these instructions at home: General instructions Return to your normal activities as told by your health care provider.  Do not drive for 24 hours if you were given a sedative during your procedure. Watch for any blood in your urine. If the amount of blood in your urine increases, call your health care provider. If a tissue sample was removed for testing (biopsy) during your procedure, it is up to you to get your test results. Ask your health care provider, or the department that is doing the test, when your results will be ready. Drink enough fluid to keep your urine pale yellow. Keep all follow-up visits as told by your health care provider. This is important. Contact a health care provider if you: Have pain that gets worse or does not get better with medicine, especially pain when you urinate. Have trouble urinating. Have more blood in your urine. Get help right away if you: Have blood clots in your urine. Have abdominal pain. Have a fever or chills. Are unable to urinate. Summary Cystoscopy is a procedure that is used to help diagnose and sometimes treat conditions that affect the lower urinary tract. Cystoscopy is done using a thin, tube-shaped instrument with a light and camera at the end. After the procedure, it is common to have some soreness or pain in your abdomen and urethra. Watch for any blood in your urine.   If the amount of blood in your urine increases, call your health care provider. If you were prescribed an antibiotic medicine, take it as told by your health care provider. Do not stop taking the antibiotic even if you start to feel better. This information is not intended to replace advice given to you by your health care provider. Make  sure you discuss any questions you have with your health care provider. Document Revised: 06/26/2018 Document Reviewed: 06/26/2018 Elsevier Patient Education  2020 [employer]  

## 2021-04-12 NOTE — Addendum Note (Signed)
Addended by: Alvera Novel on: 04/12/2021 10:08 AM   Modules accepted: Orders

## 2021-04-12 NOTE — Progress Notes (Signed)
04/12/2021 9:27 AM   Maria [name] [birth year] [id number]  Referring provider: [name], MD [place],  [place] [address]  Chief Complaint  Patient presents with   Urinary Incontinence    HPI: Was consulted to assess the patient's urinary incontinence.  She has urge incontinence and can use 7 heavy pads a day that are quite wet.  Sometimes she has bedwetting.  She has moderate stress incontinence with coughing sneezing.  She voids every 60 to 90 minutes and not all of 2 hours.  She gets up to 3 times a night  Sometimes she has intermittent burning at her recent urine culture was negative  She has COPD and lung damage and home oxygen from COVID.  She has had a hysterectomy.  She uses a cane  No neurologic issues.  No treatment.  No bladder surgery.  No kidney stones.  No history of infection   PMH: Past Medical History:  Diagnosis Date   Atherosclerosis 1/0612014   aorta, iliacs and CFA bilaterally no greater than 0-49% - lower arterial doppler.   Blood transfusion without reported diagnosis 2005   Cancer [facility]) Ovarian 1978   Chest pain    COPD (chronic obstructive pulmonary disease) ([place])    Emphysema of lung ([place]) 2019   Oxygen deficiency Sept 2020    Surgical History: Past Surgical History:  Procedure Laterality Date   ABDOMINAL HYSTERECTOMY  1978   CHOLECYSTECTOMY  2010   [facility]   OVARIAN CYST REMOVAL  1988   SPINE SURGERY  Neck 2002    Home Medications:  Allergies as of 04/12/2021       Reactions   Montelukast    Bad dreams        Medication List        Accurate as of April 12, 2021  9:27 AM. If you have any questions, ask your nurse or doctor.          Aspirin Low Dose 81 MG EC tablet Generic drug: aspirin TAKE 1 TABLET BY MOUTH EVERY DAY   atorvastatin 10 MG tablet Commonly known as: LIPITOR TAKE 1 TABLET BY MOUTH EVERY DAY   Azelastine HCl 137 MCG/SPRAY Soln USE 1 SPRAY INTO EACH NOSTRIL TWICE A  DAY   benzonatate 100 MG capsule Commonly known as: TESSALON Take 1 capsule (100 mg total) by mouth 3 (three) times daily as needed for cough.   Breztri Aerosphere 160-9-4.8 MCG/ACT Aero Generic drug: Budeson-Glycopyrrol-Formoterol Inhale 2 puffs into the lungs in the morning and at bedtime.   Flutter Devi Use as directed   gabapentin 300 MG capsule Commonly known as: Neurontin Take 1tab once daily x7 days, then 1 tab twice daily and continue   ipratropium-albuterol 0.5-2.5 (3) MG/3ML Soln Commonly known as: DUONEB USE 1 AMPULE IN NEBULIZER 4 TIMES A DAY AS NEEDED    (Dx: J84.112, J44.9)   OXYGEN Inhale into the lungs. 3 liters at rest and 5 liters with any activity.-   pantoprazole 20 MG tablet Commonly known as: PROTONIX TAKE 1 TABLET BY MOUTH TWICE A DAY   Ventolin HFA 108 (90 Base) MCG/ACT inhaler Generic drug: albuterol Inhale 2 puffs into the lungs every 6 (six) hours as needed for shortness of breath.        Allergies:  Allergies  Allergen Reactions   Montelukast     Bad dreams    Family History: Family History  Problem Relation Age of Onset   Heart failure  Mother    Diabetes Mother    Kidney disease Mother    Hypertension Mother    Arthritis Mother    Heart disease Mother    Obesity Mother    COPD Father    Hearing loss Father    Lung cancer Brother    Asthma Son    Diabetes Son    Birth defects Son    COPD Brother    COPD Brother     Social History:  reports that she quit smoking about 5 years ago. Her smoking use included cigarettes. She has a 45.00 pack-year smoking history. She has never used smokeless tobacco. She reports that she does not drink alcohol and does not use drugs.  ROS:                                        Physical Exam: BP (!) 158/77   Pulse 77   Ht 5' 5.5" (1.664 m)   Wt 73.9 kg   BMI 26.71 kg/m   Constitutional:  Alert and oriented, No acute distress. HEENT: [place] AT, moist mucus membranes.   Trachea midline, no masses. Cardiovascular: No clubbing, cyanosis, or edema. Respiratory: Normal respiratory effort, no increased work of breathing. GI: Abdomen is soft, nontender, nondistended, no abdominal masses GU: Bladder neck demonstrated mild hypermobility.  No stress incontinence with moderate cough.  Had a high small grade 1 cystocele.  Patient could not lay flat because of her breathing Skin: No rashes, bruises or suspicious lesions. Lymph: No cervical or inguinal adenopathy. Neurologic: Grossly intact, no focal deficits, moving all 4 extremities. Psychiatric: Normal mood and affect.  Laboratory Data: Lab Results  Component Value Date   WBC 11.8 (H) 11/18/2019   HGB 12.1 11/18/2019   HCT 36.2 11/18/2019   MCV 93.9 11/18/2019   PLT 361.0 11/18/2019    Lab Results  Component Value Date   CREATININE 0.73 08/22/2019    No results found for: PSA  No results found for: TESTOSTERONE  Lab Results  Component Value Date   HGBA1C 6.2 08/29/2019    Urinalysis    Component Value Date/Time   BILIRUBINUR negative 03/15/2021 1147   PROTEINUR Negative 03/15/2021 1147   UROBILINOGEN 0.2 03/15/2021 1147   NITRITE negative 03/15/2021 1147   LEUKOCYTESUR Negative 03/15/2021 1147    Pertinent Imaging: Urine reviewed.  Chart reviewed.  Urine sent for culture  Assessment & Plan: Patient has mixed incontinence.  She has medical comorbidities due to her lungs and ability.  I think it is reasonable to hold off on urodynamics currently.  Reassess the patient on Myrbetriq 25 mg samples and prescription and timed voiding for cystoscopy in about 6 weeks  1. Urinary incontinence, unspecified type  - Urinalysis, Complete   No follow-ups on file.  Reece Packer, MD  [facility] [address] [place], [place] [address] [phone]

## 2021-04-15 ENCOUNTER — Encounter: Payer: BC Managed Care – PPO | Admitting: Speech Pathology

## 2021-04-16 ENCOUNTER — Other Ambulatory Visit: Payer: Self-pay | Admitting: Adult Health

## 2021-04-16 LAB — CULTURE, URINE COMPREHENSIVE

## 2021-04-16 NOTE — Telephone Encounter (Signed)
Yes location selection is incorrect. Patient referred [facility], location should have been chosen for [facility]. Please in the future make sure that you are choosing one of the given locations in the order and not selecting "Internal" as this places the order in 'limbo' and delays it getting to the correct location and delays scheduling.   I have sent Sabrina at [facility] a message to address / schedule when she returns to office.     Gabriel Cirri, please advise on scheduling this patient, thanks!

## 2021-04-19 NOTE — Telephone Encounter (Signed)
Attempted to schedule.  LMOV to call office.  ° °

## 2021-04-21 ENCOUNTER — Other Ambulatory Visit: Payer: Self-pay

## 2021-04-21 ENCOUNTER — Ambulatory Visit (INDEPENDENT_AMBULATORY_CARE_PROVIDER_SITE_OTHER): Payer: 59 | Admitting: Adult Health

## 2021-04-21 ENCOUNTER — Encounter: Payer: Self-pay | Admitting: Adult Health

## 2021-04-21 DIAGNOSIS — J9611 Chronic respiratory failure with hypoxia: Secondary | ICD-10-CM

## 2021-04-21 DIAGNOSIS — J449 Chronic obstructive pulmonary disease, unspecified: Secondary | ICD-10-CM | POA: Diagnosis not present

## 2021-04-21 DIAGNOSIS — R053 Chronic cough: Secondary | ICD-10-CM

## 2021-04-21 MED ORDER — AZITHROMYCIN 250 MG PO TABS: ORAL_TABLET | ORAL | 0 refills | Status: AC

## 2021-04-21 MED ORDER — PREDNISONE 20 MG PO TABS: 20.0000 mg | ORAL_TABLET | Freq: Every day | ORAL | 0 refills | Status: DC

## 2021-04-21 NOTE — Assessment & Plan Note (Signed)
Acute COPD exacerbation-we will treat with empiric antibiotics and low-dose steroid burst. Continue on aggressive maintenance regimen with Breztri inhaler.  Plan  Patient Instructions  Zpack take as directed . Prednisone 20mg  daily for 5 days.  Robitussin DM 2 tsp every 4hrs for cough As needed   Tessalon Three times a day  For cough As needed   Chlortrimeton (Chlorpheniramine )  4mg  At bedtime   Continue on Gabapentin 300mg  Three times a day   Voice rest and sips of water to soothe throat   Continue on BREZTRI 2 puffs Twice daily  , rinse after use.  Continue on Protonix Twice daily   Continue on Oxygen 3lm at rest and 5l/m with activity .  Follow up with [name] or Yentl Verge NP 3 months  and As needed   Please contact office for sooner follow up if symptoms do not improve or worsen or seek emergency care

## 2021-04-21 NOTE — Assessment & Plan Note (Signed)
Continue on oxygen 3 L at rest and 5 L with activity to maintain O2 saturations greater than 88 to 90%.

## 2021-04-21 NOTE — Assessment & Plan Note (Signed)
Continue on trigger prevention and cough control regimen

## 2021-04-21 NOTE — Patient Instructions (Addendum)
Zpack take as directed . Prednisone 20mg  daily for 5 days.  Robitussin DM 2 tsp every 4hrs for cough As needed   Tessalon Three times a day  For cough As needed   Chlortrimeton (Chlorpheniramine )  4mg  At bedtime   Continue on Gabapentin 300mg  Three times a day   Voice rest and sips of water to soothe throat   Continue on BREZTRI 2 puffs Twice daily  , rinse after use.  Continue on Protonix Twice daily   Continue on Oxygen 3lm at rest and 5l/m with activity .  Follow up with [name] or Curtiss Mahmood NP 3 months  and As needed   Please contact office for sooner follow up if symptoms do not improve or worsen or seek emergency care

## 2021-04-21 NOTE — Progress Notes (Signed)
@Patient  ID: Maria [name], female    DOB: 11/04/1955, 65 y.o.   MRN: [id number]  Chief Complaint  Patient presents with   Follow-up    Referring provider: [name], MD  HPI: 65 year old female former smoker followed for moderate to severe COPD with emphysema, alpha-1 phenotype MZ (normal level), chronic cough post COVID-19, chronic respiratory failure on home oxygen Critical illness around 2007 at [facility], pulmonary embolism considered provoked as on hormone replacement therapy, prolonged vent dependent respiratory failure requiring trach and eventual decannulation  TEST/EVENTS :  06/24/19 PFTs- FEV1 1.99 (73%) p BD, DLCO 57% 01/16/20 PFTs - FEV1 1.75 (65%) p trelegy , DLCO 50% November 12, 2020 PFTs show FEV1 64%, ratio 54, FVC 91%, no significant bronchodilator response, DLCO 37%.   High-resolution CT chest August 30, 2019 negative for ILD chronic parenchymal band right lung base compatible with a nonspecific postinflammatory scarring.  Moderate to severe emphysema.   CT chest September 04, 2020 moderate emphysema.  04/21/2021 Follow up: COPD w/ emphysema , O2 RF , Chronic cough  Patient returns for a 3 month follow up. Complains of 1 week of increased cough, congestion and wheezig . Coughing up yellow mucus. Using otc cough meds . Exposed to grandson with cold like symptoms. Covid home test negative.  She remains on Breztri inhaler twice daily.  Has difficulty with ongoing chronic cough despite aggressive cough suppression and trigger prevention regimen. Patient is now on gabapentin 300 mg 3 times a day.  She is using Robitussin every 4 hours Tessalon Perles 3 times a day and added Chlor-Trimeton 4 mg at bedtime.  She also remains on Protonix twice daily.  She says prior to getting sick this last week was actually doing better on her current regimen.  Cough had decreased somewhat. She denies any hemoptysis, fever, chest pain, orthopnea, edema.  Appetite is fair.  No  nausea vomiting or diarrhea.  She remains on oxygen 3 L at rest and 5 L with activity.  She does have a POC which she says stopped working yesterday she is work with her homecare company [employer] to get this fixed.    Allergies  Allergen Reactions   Montelukast     Bad dreams    Immunization History  Administered Date(s) Administered   Influenza, Seasonal, Injecte, Preservative Fre 04/28/2008, 04/09/2010   Influenza,inj,Quad PF,6+ Mos 04/22/2019, 04/25/2020, 03/15/2021   Influenza-Unspecified 06/01/2008, 05/10/2016, 03/18/2017   Moderna Sars-Covid-2 Vaccination 07/30/2019, 08/28/2019, 06/27/2020   Pneumococcal Polysaccharide-23 04/15/2015, 04/22/2020   Td 10/01/2020   Tdap 11/17/2009   Zoster Recombinat (Shingrix) 04/16/2020, 06/22/2020    Past Medical History:  Diagnosis Date   Atherosclerosis 1/0612014   aorta, iliacs and CFA bilaterally no greater than 0-49% - lower arterial doppler.   Blood transfusion without reported diagnosis 2005   Cancer [facility]) Ovarian 1978   Chest pain    COPD (chronic obstructive pulmonary disease) (HCC)    Emphysema of lung ([place]) 2019   Oxygen deficiency Sept 2020    Tobacco History: Social History   Tobacco Use  Smoking Status Former   Packs/day: 1.00   Years: 45.00   Pack years: 45.00   Types: Cigarettes   Quit date: 03/18/2016   Years since quitting: 5.0  Smokeless Tobacco Never   Counseling given: Not Answered   Outpatient Medications Prior to Visit  Medication Sig Dispense Refill   ASPIRIN LOW DOSE 81 MG EC tablet TAKE 1 TABLET BY MOUTH EVERY DAY 90 tablet 3  atorvastatin (LIPITOR) 10 MG tablet TAKE 1 TABLET BY MOUTH EVERY DAY 90 tablet 2   Azelastine HCl 137 MCG/SPRAY SOLN USE 1 SPRAY INTO EACH NOSTRIL TWICE A DAY 30 mL 1   benzonatate (TESSALON) 100 MG capsule TAKE 1 CAPSULE BY MOUTH THREE TIMES A DAY AS NEEDED FOR COUGH 90 capsule 1   Budeson-Glycopyrrol-Formoterol (BREZTRI AEROSPHERE) 160-9-4.8 MCG/ACT AERO Inhale 2 puffs  into the lungs in the morning and at bedtime. 10.7 g 3   gabapentin (NEURONTIN) 300 MG capsule Take 1tab once daily x7 days, then 1 tab twice daily and continue 60 capsule 5   ipratropium-albuterol (DUONEB) 0.5-2.5 (3) MG/3ML SOLN USE 1 AMPULE IN NEBULIZER 4 TIMES A DAY AS NEEDED    (Dx: J84.112, J44.9) 360 mL 5   mirabegron ER (MYRBETRIQ) 25 MG TB24 tablet Take 1 tablet (25 mg total) by mouth daily. 30 tablet 0   OXYGEN Inhale into the lungs. 3 liters at rest and 5 liters with any activity.-     pantoprazole (PROTONIX) 20 MG tablet TAKE 1 TABLET BY MOUTH TWICE A DAY 180 tablet 1   Respiratory Therapy Supplies (FLUTTER) DEVI Use as directed 1 each 0   VENTOLIN HFA 108 (90 Base) MCG/ACT inhaler Inhale 2 puffs into the lungs every 6 (six) hours as needed for shortness of breath. 18 g 2   No facility-administered medications prior to visit.     Review of Systems:   Constitutional:   No  weight loss, night sweats,  Fevers, chills,  +fatigue, or  lassitude.  HEENT:   No headaches,  Difficulty swallowing,  Tooth/dental problems, or  Sore throat,                No sneezing, itching, ear ache,  +nasal congestion, post nasal drip,   CV:  No chest pain,  Orthopnea, PND, swelling in lower extremities, anasarca, dizziness, palpitations, syncope.   GI  No heartburn, indigestion, abdominal pain, nausea, vomiting, diarrhea, change in bowel habits, loss of appetite, bloody stools.   Resp: .  No chest wall deformity  Skin: no rash or lesions.  GU: no dysuria, change in color of urine, no urgency or frequency.  No flank pain, no hematuria   MS:  No joint pain or swelling.  No decreased range of motion.  No back pain.    Physical Exam  BP (!) 160/80 (BP Location: Left Arm, Cuff Size: Normal)   Pulse 89   Temp 97.8 F (36.6 C) (Temporal)   Ht 5' 5.5" (1.664 m)   Wt 171 lb 6.4 oz (77.7 kg)   SpO2 94%   BMI 28.09 kg/m   GEN: A/Ox3; pleasant , NAD, well nourished    HEENT:  [place]/AT,   NOSE-clear, THROAT-clear, no lesions, no postnasal drip or exudate noted.   NECK:  Supple w/ fair ROM; no JVD; normal carotid impulses w/o bruits; no thyromegaly or nodules palpated; no lymphadenopathy.    RESP  few scattered rhonchi  no accessory muscle use, no dullness to percussion  CARD:  RRR, no m/r/g, no peripheral edema, pulses intact, no cyanosis or clubbing.  GI:   Soft & nt; nml bowel sounds; no organomegaly or masses detected.   Musco: Warm bil, no deformities or joint swelling noted.   Neuro: alert, no focal deficits noted.    Skin: Warm, no lesions or rashes    Lab Results:   BMET   Imaging: No results found.    PFT Results Latest Ref Rng & Units  11/12/2020 01/16/2020 06/24/2019  FVC-Pre L 2.94 3.17 2.93  FVC-Predicted Pre % 84 90 83  FVC-Post L 3.18 3.25 3.39  FVC-Predicted Post % 91 92 96  Pre FEV1/FVC % % 54 55 56  Post FEV1/FCV % % 54 56 59  FEV1-Pre L 1.60 1.75 1.64  FEV1-Predicted Pre % 59 65 60  FEV1-Post L 1.71 1.83 1.99  DLCO uncorrected ml/min/mmHg 8.06 10.81 12.24  DLCO UNC% % 37 50 57  DLCO corrected ml/min/mmHg 8.06 11.29 -  DLCO COR %Predicted % 37 52 -  DLVA Predicted % 57 61 60  TLC L 4.89 5.25 5.81  TLC % Predicted % 91 98 108  RV % Predicted % 88 86 110    No results found for: NITRICOXIDE      Assessment & Plan:   Stage 2 moderate COPD by GOLD classification (HCC) Acute COPD exacerbation-we will treat with empiric antibiotics and low-dose steroid burst. Continue on aggressive maintenance regimen with Breztri inhaler.  Plan  Patient Instructions  Zpack take as directed . Prednisone 20mg  daily for 5 days.  Robitussin DM 2 tsp every 4hrs for cough As needed   Tessalon Three times a day  For cough As needed   Chlortrimeton (Chlorpheniramine )  4mg  At bedtime   Continue on Gabapentin 300mg  Three times a day   Voice rest and sips of water to soothe throat   Continue on BREZTRI 2 puffs Twice daily  , rinse after use.   Continue on Protonix Twice daily   Continue on Oxygen 3lm at rest and 5l/m with activity .  Follow up with [name] or Macy Lingenfelter NP 3 months  and As needed   Please contact office for sooner follow up if symptoms do not improve or worsen or seek emergency care       Chronic respiratory failure with hypoxia ([place]) Continue on oxygen 3 L at rest and 5 L with activity to maintain O2 saturations greater than 88 to 90%.  Chronic cough Continue on trigger prevention and cough control regimen     Rexene Edison, NP 04/21/2021

## 2021-05-11 ENCOUNTER — Ambulatory Visit (INDEPENDENT_AMBULATORY_CARE_PROVIDER_SITE_OTHER): Payer: 59 | Admitting: Family Medicine

## 2021-05-11 ENCOUNTER — Ambulatory Visit (INDEPENDENT_AMBULATORY_CARE_PROVIDER_SITE_OTHER): Payer: 59

## 2021-05-11 ENCOUNTER — Other Ambulatory Visit: Payer: Self-pay

## 2021-05-11 VITALS — BP 148/72 | HR 80 | Temp 97.0°F | Ht 65.5 in | Wt 172.0 lb

## 2021-05-11 DIAGNOSIS — M79671 Pain in right foot: Secondary | ICD-10-CM | POA: Diagnosis not present

## 2021-05-11 NOTE — Progress Notes (Signed)
Subjective:     Maria [name] is a 65 y.o. female presenting for Foot Injury (R. 2x4 fell on foot on Saturday )     Foot Injury    #Foot injury - husband is making her a shed at her house - he passed her a 2x4 and she was leaning it against the wall and it fell on her foot - was wearing cloth sneaker - immediate pain - did have to sit down for a few minutes - has been walking on the foot - does better if she does not bend the foot - swelling and bruising immediate - using ice as much as she can - treatment: aleve w/ no improvement - sitting in her recliner to elevated when crocheting   Review of Systems   Social History   Tobacco Use  Smoking Status Former   Packs/day: 1.00   Years: 45.00   Pack years: 45.00   Types: Cigarettes   Quit date: 03/18/2016   Years since quitting: 5.1  Smokeless Tobacco Never        Objective:    BP Readings from Last 3 Encounters:  05/11/21 (!) 148/72  04/21/21 (!) 160/80  04/12/21 (!) 158/77   Wt Readings from Last 3 Encounters:  05/11/21 172 lb (78 kg)  04/21/21 171 lb 6.4 oz (77.7 kg)  04/12/21 163 lb (73.9 kg)    BP (!) 148/72   Pulse 80   Temp (!) 97 F (36.1 C) (Temporal)   Ht 5' 5.5" (1.664 m)   Wt 172 lb (78 kg)   SpO2 97%   BMI 28.19 kg/m    Physical Exam Constitutional:      General: She is not in acute distress.    Appearance: She is well-developed. She is not diaphoretic.  HENT:     Right Ear: External ear normal.     Left Ear: External ear normal.  Eyes:     Conjunctiva/sclera: Conjunctivae normal.  Cardiovascular:     Rate and Rhythm: Normal rate.  Pulmonary:     Effort: Pulmonary effort is normal.  Musculoskeletal:     Cervical back: Neck supple.     Comments: Right foot Inspection: swelling and bruising along the forefoot and toes Palpation: ttp along the metatarsal bones 1-4, no ttp on the fifth metatarsal ROM: pain with movement, but normal Strength: deferred  Skin:    General: Skin  is warm and dry.     Capillary Refill: Capillary refill takes less than 2 seconds.  Neurological:     Mental Status: She is alert. Mental status is at baseline.  Psychiatric:        Mood and Affect: Mood normal.        Behavior: Behavior normal.     DG Foot Complete Right CLINICAL DATA:  Foot pain history of trauma  EXAM: RIGHT FOOT COMPLETE - 3+ VIEW  COMPARISON:  None.  FINDINGS: No fracture or malalignment. Dorsal soft tissue swelling. Cerclage wire in the first proximal phalanx. Mild degenerative changes of the first MTP joint.  IMPRESSION: 1. No definite acute osseous abnormality. 2. Postsurgical changes of the first proximal phalanx.  Electronically Signed   By: Donavan Foil M.D.   On: 05/11/2021 15:42       Assessment & Plan:   Problem List Items Addressed This Visit       Other   Right foot pain - Primary    Bruising and swelling from trauma. Advised ice, elevation, compression, hard sole shoe,  NSAIDs, weight bearing as tolerated. Return if not improving      Relevant Orders   DG Foot Complete Right (Completed)     Return if symptoms worsen or fail to improve.  [name], MD  This visit occurred during the SARS-CoV-2 public health emergency.  Safety protocols were in place, including screening questions prior to the visit, additional usage of staff PPE, and extensive cleaning of exam room while observing appropriate contact time as indicated for disinfecting solutions.

## 2021-05-11 NOTE — Assessment & Plan Note (Signed)
Bruising and swelling from trauma. Advised ice, elevation, compression, hard sole shoe, NSAIDs, weight bearing as tolerated. Return if not improving

## 2021-05-11 NOTE — Patient Instructions (Signed)
No fracture!  Would recommend hard sole shoe Elevation Aleve as needed Weight bearing as tolerated

## 2021-05-13 ENCOUNTER — Other Ambulatory Visit: Payer: Self-pay | Admitting: Internal Medicine

## 2021-05-20 ENCOUNTER — Telehealth: Payer: Self-pay | Admitting: Adult Health

## 2021-05-20 MED ORDER — DOXYCYCLINE HYCLATE 100 MG PO TABS: 100.0000 mg | ORAL_TABLET | Freq: Two times a day (BID) | ORAL | 0 refills | Status: DC

## 2021-05-20 MED ORDER — PREDNISONE 10 MG PO TABS: ORAL_TABLET | ORAL | 0 refills | Status: DC

## 2021-05-20 NOTE — Telephone Encounter (Signed)
Called and spoke with patient. She verbalized understanding of recs. Will go ahead and send in medications.   Nothing further needed at time of call.

## 2021-05-20 NOTE — Telephone Encounter (Signed)
Called and spoke to pt. Pt was seen on 04/21/21 for COPD and was given zpak and pred. Pt states she took meds and felt better (not back to baseline) but then began to feel worse. Pt states her prod cough never resolved and her mucus is more yellow now. Pt c/o hoarseness, prod cough with yellow mucus, wheezing, chest tightness. Pt denies f/c/s. Pt is following TP's recs of taking Robitussin, Tessalon, and Chlortrimeton but it isnt helping. Pt has increased her resting supplemental O2 from 3lpm to 4lpm. Pt was resting while on the phone and spo2 was 92% on 4lpm.   [name], please advise. Thanks!

## 2021-05-20 NOTE — Telephone Encounter (Signed)
Can try  Take doxycycline 100mg  po twice daily x 5 days; take after meals and avoid sunlight Please take prednisone 40 mg x1 day, then 30 mg x1 day, then 20 mg x1 day, then 10 mg x1 day, and then 5 mg x1 day and stop    Allergies  Allergen Reactions   Montelukast     Bad dreams

## 2021-05-31 ENCOUNTER — Other Ambulatory Visit: Payer: Self-pay

## 2021-05-31 ENCOUNTER — Encounter: Payer: Self-pay | Admitting: Urology

## 2021-05-31 ENCOUNTER — Ambulatory Visit (INDEPENDENT_AMBULATORY_CARE_PROVIDER_SITE_OTHER): Payer: 59 | Admitting: Urology

## 2021-05-31 VITALS — BP 160/75 | HR 81 | Ht 65.58 in | Wt 172.0 lb

## 2021-05-31 DIAGNOSIS — N3946 Mixed incontinence: Secondary | ICD-10-CM | POA: Diagnosis not present

## 2021-05-31 DIAGNOSIS — R32 Unspecified urinary incontinence: Secondary | ICD-10-CM | POA: Diagnosis not present

## 2021-05-31 LAB — URINALYSIS, COMPLETE
Bilirubin, UA: NEGATIVE
Glucose, UA: NEGATIVE
Ketones, UA: NEGATIVE
Leukocytes,UA: NEGATIVE
Nitrite, UA: NEGATIVE
Protein,UA: NEGATIVE
Specific Gravity, UA: <1.005 — ABNORMAL LOW (ref 1.005–1.030)
Urobilinogen, Ur: 0.2 mg/dL (ref 0.2–1.0)
pH, UA: 6 (ref 5.0–7.5)

## 2021-05-31 LAB — MICROSCOPIC EXAMINATION
Bacteria, UA: NONE SEEN
WBC, UA: NONE SEEN /hpf (ref 0–5)

## 2021-05-31 MED ORDER — OXYBUTYNIN CHLORIDE ER 10 MG PO TB24: 10.0000 mg | ORAL_TABLET | Freq: Every day | ORAL | 11 refills | Status: DC

## 2021-05-31 NOTE — Progress Notes (Signed)
05/31/2021 9:09 AM   [name] 10/26/1955 [id number]  Referring provider: [name], MD [place],  [place] [address]  Chief Complaint  Patient presents with   Cysto    HPI: Was consulted to assess the patient's urinary incontinence.  She has urge incontinence and can use 7 heavy pads a day that are quite wet.  Sometimes she has bedwetting.  She has moderate stress incontinence with coughing sneezing.  She voids every 60 to 90 minutes and not all of 2 hours.  She gets up to 3 times a night   Sometimes she has intermittent burning at her recent urine culture was negative  She has COPD and lung damage and home oxygen from COVID.  She has had a hysterectomy.  She uses a cane    Bladder neck demonstrated mild hypermobility.  No stress incontinence with moderate cough.  Had a high small grade 1 cystocele.  Patient could not lay flat because of her breathing  Patient has mixed incontinence.  She has medical comorbidities due to her lungs and ability.  I think it is reasonable to hold off on urodynamics currently.  Reassess the patient on Myrbetriq 25 mg samples and prescription and timed voiding for cystoscopy in about 6 weeks  Today Frequency is stable.  Last culture was negative.  Patient failed Myrbetriq  Cystoscopy: Patient underwent flexible cystoscopy.  Bladder mucosa and trigone were normal.  No foreign body.  No carcinoma.  No cystitis.  Procedure well-tolerated   PMH: Past Medical History:  Diagnosis Date   Atherosclerosis [id number]/0612014   aorta, iliacs and CFA bilaterally no greater than 0-49% - lower arterial doppler.   Blood transfusion without reported diagnosis 2005   Cancer [facility] Ovarian 1978   Chest pain    COPD (chronic obstructive pulmonary disease) ([place])    Emphysema of lung ([place]) 2019   Oxygen deficiency Sept 2020    Surgical History: Past Surgical History:  Procedure Laterality Date   ABDOMINAL HYSTERECTOMY  1978   CHOLECYSTECTOMY   2010   [place]   OVARIAN CYST REMOVAL  1988   SPINE SURGERY  Neck 2002    Home Medications:  Allergies as of 05/31/2021       Reactions   Montelukast    Bad dreams        Medication List        Accurate as of May 31, 2021  9:09 AM. If you have any questions, ask your nurse or doctor.          Aspirin Low Dose 81 MG EC tablet Generic drug: aspirin TAKE 1 TABLET BY MOUTH EVERY DAY   atorvastatin 10 MG tablet Commonly known as: LIPITOR TAKE 1 TABLET BY MOUTH EVERY DAY   Azelastine HCl 137 MCG/SPRAY Soln USE 1 SPRAY INTO EACH NOSTRIL TWICE A DAY   benzonatate 100 MG capsule Commonly known as: TESSALON TAKE 1 CAPSULE BY MOUTH THREE TIMES A DAY AS NEEDED FOR COUGH   Breztri Aerosphere 160-9-4.8 MCG/ACT Aero Generic drug: Budeson-Glycopyrrol-Formoterol Inhale 2 puffs into the lungs in the morning and at bedtime.   doxycycline 100 MG tablet Commonly known as: VIBRA-TABS Take 1 tablet (100 mg total) by mouth 2 (two) times daily.   Flutter Devi Use as directed   gabapentin 300 MG capsule Commonly known as: NEURONTIN TAKE 1 CAPSULE BY MOUTH EVERY DAY FOR 7 DAYS THEN 1 TABLET BY MOUTH TWICE A DAY AND CONTINUE   ipratropium-albuterol  0.5-2.5 (3) MG/3ML Soln Commonly known as: DUONEB USE 1 AMPULE IN NEBULIZER 4 TIMES A DAY AS NEEDED    (Dx: J84.112, J44.9)   mirabegron ER 25 MG Tb24 tablet Commonly known as: MYRBETRIQ Take 1 tablet (25 mg total) by mouth daily.   OXYGEN Inhale into the lungs. 3 liters at rest and 5 liters with any activity.-   pantoprazole 20 MG tablet Commonly known as: PROTONIX TAKE 1 TABLET BY MOUTH TWICE A DAY   predniSONE 10 MG tablet Commonly known as: DELTASONE Take 4 tabs x1 day, 3 tabs x1 day, 2 tabs x1 day, 1 tab x1 day and then 1/2 tab x1 day.   Ventolin HFA 108 (90 Base) MCG/ACT inhaler Generic drug: albuterol Inhale 2 puffs into the lungs every 6 (six) hours as needed for shortness of breath.         Allergies:  Allergies  Allergen Reactions   Montelukast     Bad dreams    Family History: Family History  Problem Relation Age of Onset   Heart failure Mother    Diabetes Mother    Kidney disease Mother    Hypertension Mother    Arthritis Mother    Heart disease Mother    Obesity Mother    COPD Father    Hearing loss Father    Lung cancer Brother    Asthma Son    Diabetes Son    Birth defects Son    COPD Brother    COPD Brother     Social History:  reports that she quit smoking about 5 years ago. Her smoking use included cigarettes. She has a 45.00 pack-year smoking history. She has never used smokeless tobacco. She reports that she does not drink alcohol and does not use drugs.  ROS:                                        Physical Exam: There were no vitals taken for this visit.  Constitutional:  Alert and oriented, No acute distress. HEENT: [place] AT, moist mucus membranes.  Trachea midline, no masses.   Laboratory Data: Lab Results  Component Value Date   WBC 11.8 (H) 11/18/2019   HGB 12.1 11/18/2019   HCT 36.2 11/18/2019   MCV 93.9 11/18/2019   PLT 361.0 11/18/2019    Lab Results  Component Value Date   CREATININE 0.73 08/22/2019    No results found for: PSA  No results found for: TESTOSTERONE  Lab Results  Component Value Date   HGBA1C 6.2 08/29/2019    Urinalysis    Component Value Date/Time   APPEARANCEUR Hazy (A) 04/12/2021 0928   GLUCOSEU Negative 04/12/2021 0928   BILIRUBINUR Negative 04/12/2021 0928   PROTEINUR Negative 04/12/2021 0928   UROBILINOGEN 0.2 03/15/2021 1147   NITRITE Negative 04/12/2021 0928   LEUKOCYTESUR Negative 04/12/2021 0928    Pertinent Imaging:   Assessment & Plan: Patient will be treated with oxybutynin ER 10 mg 3x11.  I may try 1 more medication after this.  No Gemtesa samples today.  Reassess in 6 weeks.  Percutaneous tibial nerve stimulation or in office Botox are  considerations.  1. Urinary incontinence, unspecified type   - Urinalysis, Complete   No follow-ups on file.  Reece Packer, MD  [facility] [address], [place], [place] [address] [phone]

## 2021-06-01 ENCOUNTER — Ambulatory Visit (INDEPENDENT_AMBULATORY_CARE_PROVIDER_SITE_OTHER): Payer: 59

## 2021-06-01 DIAGNOSIS — H93A2 Pulsatile tinnitus, left ear: Secondary | ICD-10-CM | POA: Diagnosis not present

## 2021-06-03 ENCOUNTER — Other Ambulatory Visit: Payer: Self-pay | Admitting: Primary Care

## 2021-06-03 DIAGNOSIS — I6523 Occlusion and stenosis of bilateral carotid arteries: Secondary | ICD-10-CM

## 2021-06-12 ENCOUNTER — Other Ambulatory Visit: Payer: Self-pay | Admitting: Internal Medicine

## 2021-06-15 DIAGNOSIS — I6529 Occlusion and stenosis of unspecified carotid artery: Secondary | ICD-10-CM | POA: Insufficient documentation

## 2021-06-15 DIAGNOSIS — E785 Hyperlipidemia, unspecified: Secondary | ICD-10-CM | POA: Insufficient documentation

## 2021-06-15 NOTE — Progress Notes (Signed)
MRN : [id number]  Maria [name] is a 65 y.o. (1955-09-29) female who presents with chief complaint of carotid blockage.  History of Present Illness:   The patient is seen for evaluation of carotid stenosis. The carotid stenosis was identified after after the patient was seen by her primary for pain radiating from the left side of her head down her neck and into her shoulder.  Examination at that time demonstrated a right carotid bruit and subsequently duplex ultrasound was ordered and performed on June 01, 2021.  I have personally reviewed the results and they show a 40 to 59% stenosis of the right internal carotid artery and a 60 to 79% stenosis of the left internal carotid artery.  The patient is also describing painful numbness and tingling of both hands with some weakness bilaterally.  She is also describing painful tingling of both feet and the inability to stand for long periods of time before she has to sit down secondary to both pain and weakness bilaterally  The patient denies amaurosis fugax. There is no recent history of TIA symptoms or focal motor deficits. There is no prior documented CVA.  There is no history of migraine headaches. There is no history of seizures.  The patient is taking enteric-coated aspirin 81 mg daily as well as a statin daily.  Patient does have a history of cervical surgery this is remote and she does not have a spine surgeon at this time.  The patient has a history of coronary artery disease, no recent episodes of angina or shortness of breath. The patient denies PAD or claudication symptoms. There is a history of hyperlipidemia which is being treated with a statin.    No outpatient medications have been marked as taking for the 06/17/21 encounter (Appointment) with Delana Meyer, Dolores Lory, MD.    Past Medical History:  Diagnosis Date   Atherosclerosis [id number]   aorta, iliacs and CFA bilaterally no greater than 0-49% - lower arterial doppler.    Blood transfusion without reported diagnosis 2005   Cancer [facility]) Ovarian 1978   Chest pain    COPD (chronic obstructive pulmonary disease) ([place])    Emphysema of lung ([place]) 2019   Oxygen deficiency Sept 2020    Past Surgical History:  Procedure Laterality Date   ABDOMINAL HYSTERECTOMY  1978   CHOLECYSTECTOMY  2010   [place] SURGERY  Neck 2002    Social History Social History   Tobacco Use   Smoking status: Former    Packs/day: 1.00    Years: 45.00    Pack years: 45.00    Types: Cigarettes    Quit date: 03/18/2016    Years since quitting: 5.2   Smokeless tobacco: Never  Vaping Use   Vaping Use: Never used  Substance Use Topics   Alcohol use: No    Alcohol/week: 0.0 standard drinks   Drug use: No    Family History Family History  Problem Relation Age of Onset   Heart failure Mother    Diabetes Mother    Kidney disease Mother    Hypertension Mother    Arthritis Mother    Heart disease Mother    Obesity Mother    COPD Father    Hearing loss Father    Lung cancer Brother    Asthma Son    Diabetes Son    Birth defects Son    COPD Brother    COPD  Brother     Allergies  Allergen Reactions   Montelukast     Bad dreams     REVIEW OF SYSTEMS (Negative unless checked)  Constitutional: [] Weight loss  [] Fever  [] Chills Cardiac: [] Chest pain   [] Chest pressure   [] Palpitations   [] Shortness of breath when laying flat   [x] Shortness of breath with exertion. Vascular:  [] Pain in legs with walking   [x] Pain in legs at rest  [] History of DVT   [] Phlebitis   [] Swelling in legs   [] Varicose veins   [] Non-healing ulcers Pulmonary:   [x] Uses home oxygen   [] Productive cough   [] Hemoptysis   [] Wheeze  [x] COPD   [] Asthma Neurologic:  [] Dizziness   [] Seizures   [] History of stroke   [] History of TIA  [] Aphasia   [] Vissual changes   [x] Weakness or numbness in arm   [x] Weakness or numbness in leg Musculoskeletal:   [] Joint  swelling   [] Joint pain   [] Low back pain Hematologic:  [] Easy bruising  [] Easy bleeding   [] Hypercoagulable state   [] Anemic Gastrointestinal:  [] Diarrhea   [] Vomiting  [] Gastroesophageal reflux/heartburn   [] Difficulty swallowing. Genitourinary:  [] Chronic kidney disease   [] Difficult urination  [] Frequent urination   [] Blood in urine Skin:  [] Rashes   [] Ulcers  Psychological:  [] History of anxiety   []  History of major depression.  Physical Examination  There were no vitals filed for this visit. There is no height or weight on file to calculate BMI. Gen: WD/WN, NAD Head: [place]/AT, No temporalis wasting.  Ear/Nose/Throat: Hearing grossly intact, nares w/o erythema or drainage Eyes: PER, EOMI, sclera nonicteric.  Neck: Supple, no masses.  No bruit or JVD.  Pulmonary:  Good air movement, no audible wheezing, no use of accessory muscles.  Cardiac: RRR, normal S1, S2, no Murmurs. Vascular:   I do not auscultate a left carotid bruit right carotid bruit is a 3 out of 6 Vessel Right Left  Radial Palpable Palpable  Carotid Palpable Palpable  Gastrointestinal: soft, non-distended. No guarding/no peritoneal signs.  Musculoskeletal: M/S 5/5 throughout.  No visible deformity.  Neurologic: CN 2-12 intact. Pain and light touch intact in extremities.  Symmetrical.  Speech is fluent. Motor exam as listed above. Psychiatric: Judgment intact, Mood & affect appropriate for pt's clinical situation. Dermatologic: No rashes or ulcers noted.  No changes consistent with cellulitis.   CBC Lab Results  Component Value Date   WBC 11.8 (H) 11/18/2019   HGB 12.1 11/18/2019   HCT 36.2 11/18/2019   MCV 93.9 11/18/2019   PLT 361.0 11/18/2019    BMET    Component Value Date/Time   NA 140 08/22/2019 1359   K 4.3 08/22/2019 1359   CL 103 08/22/2019 1359   CO2 28 08/22/2019 1359   GLUCOSE 116 (H) 08/22/2019 1359   BUN 13 08/22/2019 1359   CREATININE 0.73 08/22/2019 1359   CALCIUM 9.7 08/22/2019 1359    GFRNONAA >60 03/03/2019 0154   GFRAA >60 03/03/2019 0154   CrCl cannot be calculated (Patient's most recent lab result is older than the maximum 21 days allowed.).  COAG Lab Results  Component Value Date   INR 1.0 10/03/2008   INR 1.0 10/02/2008    Radiology VAS US CAROTID  Result Date: 06/02/2021 Carotid Arterial Duplex Study Patient Name:  KRYSTINA STRIETER  Date of Exam:   06/01/2021 Medical Rec #: [id number]       Accession #:    [id number] Date of Birth: Feb 04, 1956  Patient Gender: F Patient Age:   89 years Exam Location:  [place] Procedure:      VAS US CAROTID Referring Phys: JESSICA CODY --------------------------------------------------------------------------------  Indications:  Carotid artery disease and Patient states she has been               experiencing left neck pain as well as right arm cramping and               numbness in her fingers. Denies any cerebrovascular symptoms               during todays exam. Risk Factors: Past history of smoking. Performing Technologist: Caesar Chestnut RDCS, RVT Supporting Technologist: Wilkie Aye RVT  Examination Guidelines: A complete evaluation includes B-mode imaging, spectral Doppler, color Doppler, and power Doppler as needed of all accessible portions of each vessel. Bilateral testing is considered an integral part of a complete examination. Limited examinations for reoccurring indications may be performed as noted.  Right Carotid Findings: +----------+--------+--------+--------+-------------------------------+--------+           PSV cm/sEDV cm/sStenosisPlaque Description             Comments +----------+--------+--------+--------+-------------------------------+--------+ CCA Prox  120     20                                                      +----------+--------+--------+--------+-------------------------------+--------+ CCA Mid   121     25                                                       +----------+--------+--------+--------+-------------------------------+--------+ CCA Distal110     20                                                      +----------+--------+--------+--------+-------------------------------+--------+ ICA Prox  111     27              focal, calcific and                                                       heterogenous                            +----------+--------+--------+--------+-------------------------------+--------+ ICA Mid   118     33                                                      +----------+--------+--------+--------+-------------------------------+--------+ ICA Distal143     43      40-59%  homogeneous                             +----------+--------+--------+--------+-------------------------------+--------+  ECA       102     18              focal and calcific                      +----------+--------+--------+--------+-------------------------------+--------+ +----------+--------+-------+---------+-------------------+           PSV cm/sEDV cmsDescribe Arm Pressure (mmHG) +----------+--------+-------+---------+-------------------+ Subclavian260     23     Turbulent144                 +----------+--------+-------+---------+-------------------+ +---------+--------+--+--------+--+----------+ VertebralPSV cm/s44EDV cm/s10Retrograde +---------+--------+--+--------+--+----------+  Left Carotid Findings: +---------+--------+-------+--------+---------------------------------+--------+          PSV cm/sEDV    StenosisPlaque Description               Comments                  cm/s                                                     +---------+--------+-------+--------+---------------------------------+--------+ CCA Prox 90      24                                                       +---------+--------+-------+--------+---------------------------------+--------+ CCA Mid  95      27                                                        +---------+--------+-------+--------+---------------------------------+--------+ CCA      96      30             heterogenous and diffuse                  Distal                                                                    +---------+--------+-------+--------+---------------------------------+--------+ ICA Prox 249     67     60-79%  irregular, heterogenous and                                               calcific                                  +---------+--------+-------+--------+---------------------------------+--------+ ICA Mid  159     44                                                       +---------+--------+-------+--------+---------------------------------+--------+  ICA      91      35                                                       Distal                                                                    +---------+--------+-------+--------+---------------------------------+--------+ ECA      210     40             heterogenous and focal                    +---------+--------+-------+--------+---------------------------------+--------+ +----------+--------+--------+---------+-------------------+           PSV cm/sEDV cm/sDescribe Arm Pressure (mmHG) +----------+--------+--------+---------+-------------------+ Subclavian130             Turbulent167                 +----------+--------+--------+---------+-------------------+ +---------+--------+--+--------+--+---------+ VertebralPSV cm/s58EDV cm/s13Antegrade +---------+--------+--+--------+--+---------+ Left brachial pressure is 23 mmHg higher than right pressure.  Summary: Right Carotid: Velocities in the right ICA are consistent with a 40-59%                stenosis. Left Carotid: Velocities in the left ICA are consistent with a 60-79% stenosis. Vertebrals:  Left vertebral artery demonstrates antegrade flow. Right  vertebral              artery demonstrates retrograde flow. Subclavians: Bilateral subclavian artery flow was disturbed. Right innominate              appears to be stenotic. *See table(s) above for measurements and observations. Suggest follow up study in 12 months. Vascular consult recommended. Electronically signed by Carlyle Dolly MD on 06/02/2021 at 6:55:51 PM.    Final      Assessment/Plan 1. Bilateral carotid artery stenosis Recommend:  Given the patient's asymptomatic subcritical stenosis no further invasive testing or surgery at this time.  I believe her symptoms are related to the DJD of the cervical and possibly LS spine.  Duplex ultrasound shows 40-59% RICA stenosis and 56-38% LICA stenosis.  Continue antiplatelet therapy as prescribed Continue management of CAD, HTN and Hyperlipidemia Healthy heart diet,  encouraged exercise at least 4 times per week Follow up in 6 months with duplex ultrasound and physical exam   - VAS US CAROTID; Future  2. Numbness and tingling in both hands I believe her symptoms are related to the DJD of the cervical and possibly LS spine. - Ambulatory referral to Neurosurgery  3. Aortic atherosclerosis (HCC)  Recommend:  The patient has evidence of atherosclerosis of the lower extremities with claudication.  The patient does not voice lifestyle limiting changes at this point in time.  Noninvasive studies do not suggest clinically significant change.  No invasive studies, angiography or surgery at this time The patient should continue walking and begin a more formal exercise program.  The patient should continue antiplatelet therapy and aggressive treatment of the lipid abnormalities  No changes in the patient's medications at this time  The patient should continue wearing graduated  compression socks 10-15 mmHg strength to control the mild edema.    4. Stage 2 moderate COPD by GOLD classification ([place]) Continue pulmonary medications and  aerosols as already ordered, these medications have been reviewed and there are no changes at this time.    5. Mixed hyperlipidemia Continue statin as ordered and reviewed, no changes at this time    Hortencia Pilar, MD  06/15/2021 6:47 PM

## 2021-06-17 ENCOUNTER — Ambulatory Visit (INDEPENDENT_AMBULATORY_CARE_PROVIDER_SITE_OTHER): Payer: 59 | Admitting: Vascular Surgery

## 2021-06-17 ENCOUNTER — Other Ambulatory Visit: Payer: Self-pay

## 2021-06-17 ENCOUNTER — Encounter (INDEPENDENT_AMBULATORY_CARE_PROVIDER_SITE_OTHER): Payer: Self-pay | Admitting: Vascular Surgery

## 2021-06-17 VITALS — BP 172/70 | HR 78 | Ht 65.0 in | Wt 178.0 lb

## 2021-06-17 DIAGNOSIS — J449 Chronic obstructive pulmonary disease, unspecified: Secondary | ICD-10-CM | POA: Diagnosis not present

## 2021-06-17 DIAGNOSIS — I6523 Occlusion and stenosis of bilateral carotid arteries: Secondary | ICD-10-CM | POA: Diagnosis not present

## 2021-06-17 DIAGNOSIS — E782 Mixed hyperlipidemia: Secondary | ICD-10-CM

## 2021-06-17 DIAGNOSIS — I700 Atherosclerosis of aorta: Secondary | ICD-10-CM

## 2021-06-17 DIAGNOSIS — R200 Anesthesia of skin: Secondary | ICD-10-CM | POA: Insufficient documentation

## 2021-06-17 DIAGNOSIS — R202 Paresthesia of skin: Secondary | ICD-10-CM

## 2021-06-24 ENCOUNTER — Encounter (INDEPENDENT_AMBULATORY_CARE_PROVIDER_SITE_OTHER): Payer: Self-pay | Admitting: Vascular Surgery

## 2021-06-24 ENCOUNTER — Encounter (INDEPENDENT_AMBULATORY_CARE_PROVIDER_SITE_OTHER): Payer: Self-pay

## 2021-06-25 ENCOUNTER — Other Ambulatory Visit: Payer: Self-pay | Admitting: Internal Medicine

## 2021-06-27 ENCOUNTER — Other Ambulatory Visit: Payer: Self-pay | Admitting: Adult Health

## 2021-07-14 ENCOUNTER — Encounter (INDEPENDENT_AMBULATORY_CARE_PROVIDER_SITE_OTHER): Payer: Self-pay | Admitting: Nurse Practitioner

## 2021-07-22 ENCOUNTER — Ambulatory Visit: Payer: 59 | Admitting: Adult Health

## 2021-07-26 ENCOUNTER — Other Ambulatory Visit: Payer: Self-pay

## 2021-07-26 ENCOUNTER — Ambulatory Visit (INDEPENDENT_AMBULATORY_CARE_PROVIDER_SITE_OTHER): Payer: 59 | Admitting: Urology

## 2021-07-26 ENCOUNTER — Encounter: Payer: Self-pay | Admitting: Urology

## 2021-07-26 ENCOUNTER — Telehealth: Payer: Self-pay | Admitting: Family Medicine

## 2021-07-26 ENCOUNTER — Other Ambulatory Visit: Payer: Self-pay | Admitting: Internal Medicine

## 2021-07-26 VITALS — BP 155/71 | HR 70 | Ht 65.0 in | Wt 178.0 lb

## 2021-07-26 DIAGNOSIS — N3946 Mixed incontinence: Secondary | ICD-10-CM | POA: Diagnosis not present

## 2021-07-26 DIAGNOSIS — I700 Atherosclerosis of aorta: Secondary | ICD-10-CM

## 2021-07-26 DIAGNOSIS — Z8709 Personal history of other diseases of the respiratory system: Secondary | ICD-10-CM

## 2021-07-26 MED ORDER — GEMTESA 75 MG PO TABS: 75.0000 mg | ORAL_TABLET | Freq: Every day | ORAL | 0 refills | Status: DC

## 2021-07-26 NOTE — Progress Notes (Signed)
07/26/2021 9:08 AM   Maria [name] February 23, 1956 [id number]  Referring provider: [name], MD [name],  [place] [id number]  Chief Complaint  Patient presents with   Urinary Incontinence    HPI: Was consulted to assess the patient's urinary incontinence.  She has urge incontinence and can use 7 heavy pads a day that are quite wet.  Sometimes she has bedwetting.  She has moderate stress incontinence with coughing sneezing.  She voids every 60 to 90 minutes and not all of 2 hours.  She gets up to 3 times a night   Sometimes she has intermittent burning at her recent urine culture was negative  She has COPD and lung damage and home oxygen from COVID.  She has had a hysterectomy.  She uses a cane     Bladder neck demonstrated mild hypermobility.  No stress incontinence with moderate cough.  Had a high small grade 1 cystocele.  Patient could not lay flat because of her breathing   Patient has mixed incontinence.  She has medical comorbidities due to her lungs and ability.  I think it is reasonable to hold off on urodynamics currently. Patient failed Myrbetriq   Cystoscopy normal.  Patient will be treated with oxybutynin ER 10 mg 3x11.  I may try 1 more medication after this.  No Gemtesa samples today.  Reassess in 6 weeks.  Percutaneous tibial nerve stimulation or in office Botox are considerations.  Today Urgency incontinence persisting.  Yesterday she felt discomfort with foot on the floor syndrome.  I went over percutaneous tibial nerve stimulation and gave a handout.   PMH: Past Medical History:  Diagnosis Date   Atherosclerosis 1/0612014   aorta, iliacs and CFA bilaterally no greater than 0-49% - lower arterial doppler.   Blood transfusion without reported diagnosis 2005   Cancer [facility]) Ovarian 1978   Chest pain    COPD (chronic obstructive pulmonary disease) ([place])    Emphysema of lung ([place]) 2019   Oxygen deficiency Sept 2020    Surgical History: Past  Surgical History:  Procedure Laterality Date   ABDOMINAL HYSTERECTOMY  1978   CHOLECYSTECTOMY  2010   [place]   OVARIAN CYST REMOVAL  1988   SPINE SURGERY  Neck 2002    Home Medications:  Allergies as of 07/26/2021       Reactions   Montelukast    Bad dreams        Medication List        Accurate as of July 26, 2021  9:08 AM. If you have any questions, ask your nurse or doctor.          Aspirin Low Dose 81 MG EC tablet Generic drug: aspirin TAKE 1 TABLET BY MOUTH EVERY DAY   atorvastatin 10 MG tablet Commonly known as: LIPITOR TAKE 1 TABLET BY MOUTH EVERY DAY   Azelastine HCl 137 MCG/SPRAY Soln SPRAY 1 SPRAY INTO EACH NOSTRIL TWICE A DAY   benzonatate 100 MG capsule Commonly known as: TESSALON TAKE 1 CAPSULE BY MOUTH THREE TIMES A DAY AS NEEDED FOR COUGH   Breztri Aerosphere 160-9-4.8 MCG/ACT Aero Generic drug: Budeson-Glycopyrrol-Formoterol INHALE 2 PUFFS INTO THE LUNGS IN THE MORNING AND AT BEDTIME.   Flutter Devi Use as directed   gabapentin 300 MG capsule Commonly known as: NEURONTIN TAKE 1 CAPSULE BY MOUTH EVERY DAY FOR 7 DAYS THEN 1 TABLET BY MOUTH TWICE A DAY AND CONTINUE   ipratropium-albuterol 0.5-2.5 (3) MG/3ML  Soln Commonly known as: DUONEB USE 1 AMPULE IN NEBULIZER 4 TIMES A DAY AS NEEDED    (Dx: J84.112, J44.9)   oxybutynin 10 MG 24 hr tablet Commonly known as: DITROPAN-XL Take 1 tablet (10 mg total) by mouth daily.   OXYGEN Inhale into the lungs. 3 liters at rest and 5 liters with any activity.-   pantoprazole 20 MG tablet Commonly known as: PROTONIX TAKE 1 TABLET BY MOUTH TWICE A DAY   Ventolin HFA 108 (90 Base) MCG/ACT inhaler Generic drug: albuterol Inhale 2 puffs into the lungs every 6 (six) hours as needed for shortness of breath.        Allergies:  Allergies  Allergen Reactions   Montelukast     Bad dreams    Family History: Family History  Problem Relation Age of Onset   Heart failure Mother     Diabetes Mother    Kidney disease Mother    Hypertension Mother    Arthritis Mother    Heart disease Mother    Obesity Mother    COPD Father    Hearing loss Father    Lung cancer Brother    Asthma Son    Diabetes Son    Birth defects Son    COPD Brother    COPD Brother     Social History:  reports that she quit smoking about 5 years ago. Her smoking use included cigarettes. She has a 45.00 pack-year smoking history. She has never used smokeless tobacco. She reports that she does not drink alcohol and does not use drugs.  ROS:                                       Laboratory Data: Lab Results  Component Value Date   WBC 11.8 (H) 11/18/2019   HGB 12.1 11/18/2019   HCT 36.2 11/18/2019   MCV 93.9 11/18/2019   PLT 361.0 11/18/2019    Lab Results  Component Value Date   CREATININE 0.73 08/22/2019    No results found for: PSA  No results found for: TESTOSTERONE  Lab Results  Component Value Date   HGBA1C 6.2 08/29/2019    Urinalysis    Component Value Date/Time   APPEARANCEUR Clear 05/31/2021 0910   GLUCOSEU Negative 05/31/2021 0910   BILIRUBINUR Negative 05/31/2021 0910   PROTEINUR Negative 05/31/2021 0910   UROBILINOGEN 0.2 03/15/2021 1147   NITRITE Negative 05/31/2021 0910   LEUKOCYTESUR Negative 05/31/2021 0910    Pertinent Imaging:   Assessment & Plan: We gave Gemtesa samples.  I gave an urgent PC handout.  We will get a urine culture make sure she does not have an infection.  She will call in 1 month and start percutaneous tibial nerve stimulation if medication does not help  There are no diagnoses linked to this encounter.  No follow-ups on file.  Reece Packer, MD  [facility] [address] [place], [place] [address] [phone]

## 2021-07-27 ENCOUNTER — Telehealth: Payer: Self-pay | Admitting: Family Medicine

## 2021-07-27 NOTE — Telephone Encounter (Signed)
Lvm for pt to schedule cpe/lab also sent my chart letter

## 2021-07-28 ENCOUNTER — Ambulatory Visit (INDEPENDENT_AMBULATORY_CARE_PROVIDER_SITE_OTHER): Payer: 59 | Admitting: Adult Health

## 2021-07-28 ENCOUNTER — Other Ambulatory Visit: Payer: Self-pay

## 2021-07-28 ENCOUNTER — Encounter: Payer: Self-pay | Admitting: Adult Health

## 2021-07-28 VITALS — BP 140/60 | HR 98 | Temp 98.2°F | Ht 63.75 in | Wt 172.0 lb

## 2021-07-28 DIAGNOSIS — R053 Chronic cough: Secondary | ICD-10-CM

## 2021-07-28 DIAGNOSIS — J449 Chronic obstructive pulmonary disease, unspecified: Secondary | ICD-10-CM | POA: Diagnosis not present

## 2021-07-28 DIAGNOSIS — J438 Other emphysema: Secondary | ICD-10-CM

## 2021-07-28 DIAGNOSIS — E8801 Alpha-1-antitrypsin deficiency: Secondary | ICD-10-CM

## 2021-07-28 DIAGNOSIS — J9611 Chronic respiratory failure with hypoxia: Secondary | ICD-10-CM

## 2021-07-28 MED ORDER — PANTOPRAZOLE SODIUM 40 MG PO TBEC: 40.0000 mg | DELAYED_RELEASE_TABLET | Freq: Every day | ORAL | 11 refills | Status: DC

## 2021-07-28 MED ORDER — GABAPENTIN 300 MG PO CAPS: 300.0000 mg | ORAL_CAPSULE | Freq: Three times a day (TID) | ORAL | 5 refills | Status: DC

## 2021-07-28 MED ORDER — ALBUTEROL SULFATE HFA 108 (90 BASE) MCG/ACT IN AERS: 2.0000 | INHALATION_SPRAY | Freq: Four times a day (QID) | RESPIRATORY_TRACT | 2 refills | Status: DC | PRN

## 2021-07-28 MED ORDER — VENTOLIN HFA 108 (90 BASE) MCG/ACT IN AERS: 2.0000 | INHALATION_SPRAY | Freq: Four times a day (QID) | RESPIRATORY_TRACT | 2 refills | Status: DC | PRN

## 2021-07-28 NOTE — Patient Instructions (Signed)
Robitussin DM 2 tsp every 4hrs for cough As needed   Tessalon Three times a day  For cough As needed   Chlortrimeton (Chlorpheniramine )  4mg  At bedtime   Continue on Gabapentin 300mg  Three times a day   Voice rest and sips of water to soothe throat   Continue on BREZTRI 2 puffs Twice daily  , rinse after use.  Change Protonix 40mg  daily.  Covid booster as discussed.  Continue on Oxygen 3lm at rest and 5l/m with activity .  Follow up with [name] or Rafael Quesada NP 3-4 months  and As needed   Please contact office for sooner follow up if symptoms do not improve or worsen or seek emergency care

## 2021-07-28 NOTE — Assessment & Plan Note (Signed)
Continue on current regimen.  Patient has alpha-1 phenotype MZ but normal levels.  Continue to follow along with current regimen

## 2021-07-28 NOTE — Assessment & Plan Note (Signed)
Compensated on present regimen. Chest x-ray today Discussed COVID booster.  Plan  Patient Instructions  Robitussin DM 2 tsp every 4hrs for cough As needed   Tessalon Three times a day  For cough As needed   Chlortrimeton (Chlorpheniramine )  4mg  At bedtime   Continue on Gabapentin 300mg  Three times a day   Voice rest and sips of water to soothe throat   Continue on BREZTRI 2 puffs Twice daily  , rinse after use.  Change Protonix 40mg  daily.  Covid booster as discussed.  Continue on Oxygen 3lm at rest and 5l/m with activity .  Follow up with [name] or Willa Brocks NP 3-4 months  and As needed   Please contact office for sooner follow up if symptoms do not improve or worsen or seek emergency care

## 2021-07-28 NOTE — Assessment & Plan Note (Addendum)
Appears stable with no increased oxygen demands Continue on oxygen to maintain O2 saturations greater than 88 to 90%

## 2021-07-28 NOTE — Progress Notes (Signed)
@Patient  ID: Maria [name], female    DOB: 12/14/1955, 66 y.o.   MRN: [id number]  Chief Complaint  Patient presents with   Follow-up    Referring provider: [name], MD  HPI: 66 year old female former smoker followed for moderate to severe COPD with emphysema, alpha-1 phenotype MZ (normal level), chronic cough post COVID-19 infection, chronic respiratory failure on home oxygen Critical illness around 2007 at [facility] complicated by PE considered provoked as on hormone replacement therapy, prolonged vent dependent respiratory failure requiring trach and eventual decannulation   TEST/EVENTS :  01/16/20 PFTs - FEV1 1.75 (65%) p trelegy , DLCO 50% November 12, 2020 PFTs show FEV1 64%, ratio 54, FVC 91%, no significant bronchodilator response, DLCO 37%.   High-resolution CT chest August 30, 2019 negative for ILD chronic parenchymal band right lung base compatible with a nonspecific postinflammatory scarring.  Moderate to severe emphysema.   CT chest September 04, 2020 moderate emphysema.  Alpha 1 MZ, Level 103-125.   07/28/2021 Follow up : COPD with emphysema, oxygen dependent respiratory failure, chronic cough Patient returns for 3-month follow-up.  Patient remains on Breztri inhaler twice daily.  She continues to have ongoing chronic cough despite aggressive cough control regimen.  She is on gabapentin 300 mg 3 times daily, Robitussin DM every 4 hours, Tessalon Perles and Chlor-Trimeton.  Along with reflux diet and PPI. Ran out of Duoneb 2 weeks ago. Typically uses Twice daily . Notices difference in breathing without it.  She remains on oxygen 3 L at rest and 5 L with activity.Uses POC when out from home. Really helps.  Overall feels she is doing some better. Still has cough but not quite is bad. Does have some tightness in chest and back on right side at times. No chest pain, palpitations,  Flu shot is utd. PVX is utd.   Lives at home with husband. Drives. Brother has  COPD on O2 . Husband works fulltime Adult [employer]ician, dispensing. 6 grandkids.  Stays active, light housework, shopping.   Patient's insurance will not cover Protonix 20 mg twice daily.  Will only cover Protonix 40 mg.  We discussed changing her prescription over She denies any increased reflux symptoms  Allergies  Allergen Reactions   Montelukast     Bad dreams    Immunization History  Administered Date(s) Administered   Influenza, Seasonal, Injecte, Preservative Fre 04/28/2008, 04/09/2010   Influenza,inj,Quad PF,6+ Mos 04/22/2019, 04/25/2020, 03/15/2021   Influenza-Unspecified 06/01/2008, 05/10/2016, 03/18/2017   Moderna Sars-Covid-2 Vaccination 07/30/2019, 08/28/2019, 06/27/2020   Pneumococcal Polysaccharide-23 04/15/2015, 04/22/2020   Td 10/01/2020   Tdap 11/17/2009   Zoster Recombinat (Shingrix) 04/16/2020, 06/22/2020    Past Medical History:  Diagnosis Date   Atherosclerosis 1/0612014   aorta, iliacs and CFA bilaterally no greater than 0-49% - lower arterial doppler.   Blood transfusion without reported diagnosis 2005   Cancer [facility]) Ovarian 1978   Chest pain    COPD (chronic obstructive pulmonary disease) ([facility])    Emphysema of lung ([place]) 2019   Oxygen deficiency Sept 2020    Tobacco History: Social History   Tobacco Use  Smoking Status Former   Packs/day: 1.00   Years: 45.00   Pack years: 45.00   Types: Cigarettes   Quit date: 03/18/2016   Years since quitting: 5.3  Smokeless Tobacco Never   Counseling given: Not Answered   Outpatient Medications Prior to Visit  Medication Sig Dispense Refill   ASPIRIN LOW DOSE 81 MG EC tablet TAKE 1 TABLET BY  MOUTH EVERY DAY 90 tablet 3   atorvastatin (LIPITOR) 10 MG tablet TAKE 1 TABLET BY MOUTH EVERY DAY 90 tablet 2   Azelastine HCl 137 MCG/SPRAY SOLN SPRAY 1 SPRAY INTO EACH NOSTRIL TWICE A DAY 30 mL 1   benzonatate (TESSALON) 100 MG capsule TAKE 1 CAPSULE BY MOUTH THREE TIMES A DAY AS NEEDED FOR COUGH 90 capsule 0   BREZTRI AEROSPHERE  160-9-4.8 MCG/ACT AERO INHALE 2 PUFFS INTO THE LUNGS IN THE MORNING AND AT BEDTIME. 5.9 g 1   ipratropium-albuterol (DUONEB) 0.5-2.5 (3) MG/3ML SOLN USE 1 AMPULE IN NEBULIZER 4 TIMES A DAY AS NEEDED (DX: J84.112, J44.9) 360 mL 5   oxybutynin (DITROPAN-XL) 10 MG 24 hr tablet Take 1 tablet (10 mg total) by mouth daily. 30 tablet 11   OXYGEN Inhale into the lungs. 3 liters at rest and 5 liters with any activity.-     pantoprazole (PROTONIX) 20 MG tablet TAKE 1 TABLET BY MOUTH TWICE A DAY 180 tablet 1   Respiratory Therapy Supplies (FLUTTER) DEVI Use as directed 1 each 0   Vibegron (GEMTESA) 75 MG TABS Take 75 mg by mouth daily. 30 tablet 0   gabapentin (NEURONTIN) 300 MG capsule TAKE 1 CAPSULE BY MOUTH EVERY DAY FOR 7 DAYS THEN 1 TABLET BY MOUTH TWICE A DAY AND CONTINUE 60 capsule 5   VENTOLIN HFA 108 (90 Base) MCG/ACT inhaler Inhale 2 puffs into the lungs every 6 (six) hours as needed for shortness of breath. 18 g 2   No facility-administered medications prior to visit.     Review of Systems:   Constitutional:   No  weight loss, night sweats,  Fevers, chills, fatigue, or  lassitude.  HEENT:   No headaches,  Difficulty swallowing,  Tooth/dental problems, or  Sore throat,                No sneezing, itching, ear ache, nasal congestion, post nasal drip,   CV:  No chest pain,  Orthopnea, PND, swelling in lower extremities, anasarca, dizziness, palpitations, syncope.   GI  No heartburn, indigestion, abdominal pain, nausea, vomiting, diarrhea, change in bowel habits, loss of appetite, bloody stools.   Resp: No shortness of breath with exertion or at rest.  No excess mucus, no productive cough,  No non-productive cough,  No coughing up of blood.  No change in color of mucus.  No wheezing.  No chest wall deformity  Skin: no rash or lesions.  GU: no dysuria, change in color of urine, no urgency or frequency.  No flank pain, no hematuria   MS:  No joint pain or swelling.  No decreased range of  motion.  No back pain.    Physical Exam  BP 140/60 (BP Location: Left Arm, Cuff Size: Normal)    Pulse 98    Temp 98.2 F (36.8 C) (Oral)    Ht 5' 3.75" (1.619 m)    Wt 172 lb (78 kg)    SpO2 96%    BMI 29.76 kg/m   GEN: A/Ox3; pleasant , NAD, chronically ill-appearing on oxygen   HEENT:  [place]/AT,  EACs-clear, TMs-wnl, NOSE-clear, THROAT-clear, no lesions, no postnasal drip or exudate noted.   NECK:  Supple w/ fair ROM; no JVD; normal carotid impulses w/o bruits; no thyromegaly or nodules palpated; no lymphadenopathy.    RESP  Clear  P & A; w/o, wheezes/ rales/ or rhonchi. no accessory muscle use, no dullness to percussion  CARD:  RRR, no m/r/g, tr  peripheral edema,  pulses intact, no cyanosis or clubbing.  GI:   Soft & nt; nml bowel sounds; no organomegaly or masses detected.   Musco: Warm bil, no deformities or joint swelling noted.   Neuro: alert, no focal deficits noted.    Skin: Warm, no lesions or rashes    Lab Results:  CBC   BMET   BNP   Imaging: No results found.    PFT Results Latest Ref Rng & Units 11/12/2020 01/16/2020 06/24/2019  FVC-Pre L 2.94 3.17 2.93  FVC-Predicted Pre % 84 90 83  FVC-Post L 3.18 3.25 3.39  FVC-Predicted Post % 91 92 96  Pre FEV1/FVC % % 54 55 56  Post FEV1/FCV % % 54 56 59  FEV1-Pre L 1.60 1.75 1.64  FEV1-Predicted Pre % 59 65 60  FEV1-Post L 1.71 1.83 1.99  DLCO uncorrected ml/min/mmHg 8.06 10.81 12.24  DLCO UNC% % 37 50 57  DLCO corrected ml/min/mmHg 8.06 11.29 -  DLCO COR %Predicted % 37 52 -  DLVA Predicted % 57 61 60  TLC L 4.89 5.25 5.81  TLC % Predicted % 91 98 108  RV % Predicted % 88 86 110    No results found for: NITRICOXIDE      Assessment & Plan:   No problem-specific Assessment & Plan notes found for this encounter.     Rexene Edison, NP 07/28/2021

## 2021-07-28 NOTE — Telephone Encounter (Signed)
Pt scheduled CPE/LAB in march 2023

## 2021-07-28 NOTE — Assessment & Plan Note (Signed)
Chronic cough appears to be stable but not resolved despite maximum therapy including gabapentin.  Patient's been unable to wean off or decrease gabapentin dose.  We will continue on current regimen for cough control, trigger prevention  Plan  Patient Instructions  Robitussin DM 2 tsp every 4hrs for cough As needed   Tessalon Three times a day  For cough As needed   Chlortrimeton (Chlorpheniramine )  4mg  At bedtime   Continue on Gabapentin 300mg  Three times a day   Voice rest and sips of water to soothe throat   Continue on BREZTRI 2 puffs Twice daily  , rinse after use.  Change Protonix 40mg  daily.  Covid booster as discussed.  Continue on Oxygen 3lm at rest and 5l/m with activity .  Follow up with [name] or Davie Sagona NP 3-4 months  and As needed   Please contact office for sooner follow up if symptoms do not improve or worsen or seek emergency care

## 2021-07-29 LAB — CULTURE, URINE COMPREHENSIVE

## 2021-07-30 ENCOUNTER — Ambulatory Visit (INDEPENDENT_AMBULATORY_CARE_PROVIDER_SITE_OTHER): Payer: 59

## 2021-07-30 DIAGNOSIS — J449 Chronic obstructive pulmonary disease, unspecified: Secondary | ICD-10-CM | POA: Diagnosis not present

## 2021-08-08 ENCOUNTER — Other Ambulatory Visit: Payer: Self-pay | Admitting: Pulmonary Disease

## 2021-08-09 ENCOUNTER — Encounter: Payer: Self-pay | Admitting: Nurse Practitioner

## 2021-08-09 ENCOUNTER — Ambulatory Visit (INDEPENDENT_AMBULATORY_CARE_PROVIDER_SITE_OTHER): Payer: 59 | Admitting: Nurse Practitioner

## 2021-08-09 ENCOUNTER — Ambulatory Visit (INDEPENDENT_AMBULATORY_CARE_PROVIDER_SITE_OTHER)
Admission: RE | Admit: 2021-08-09 | Discharge: 2021-08-09 | Disposition: A | Discharge status: Home / Self Care | Payer: 59 | Source: Ambulatory Visit | Attending: Nurse Practitioner | Admitting: Nurse Practitioner

## 2021-08-09 ENCOUNTER — Other Ambulatory Visit: Payer: Self-pay

## 2021-08-09 VITALS — BP 154/62 | HR 83 | Temp 97.7°F | Resp 18 | Ht 65.5 in | Wt 172.2 lb

## 2021-08-09 DIAGNOSIS — M25511 Pain in right shoulder: Secondary | ICD-10-CM

## 2021-08-09 DIAGNOSIS — Z9889 Other specified postprocedural states: Secondary | ICD-10-CM

## 2021-08-09 DIAGNOSIS — M792 Neuralgia and neuritis, unspecified: Secondary | ICD-10-CM

## 2021-08-09 DIAGNOSIS — G8929 Other chronic pain: Secondary | ICD-10-CM | POA: Insufficient documentation

## 2021-08-09 MED ORDER — PREDNISONE 20 MG PO TABS: ORAL_TABLET | ORAL | 0 refills | Status: AC

## 2021-08-09 NOTE — Progress Notes (Signed)
Acute Office Visit  Subjective:    Patient ID: Maria [name], female    DOB: 01/19/1956, 66 y.o.   MRN: [id number]  Chief Complaint  Patient presents with   Arm Pain    Has had right arm pain and her right hand/numbness and pain sensation for 3 weeks now. Has been taking tylenol with no relief. No fall or injury to the area. Started suddenly.     Patient is in today for Right arm pain   States that it started approx 3-4 weeks ago. States that it has progressively gotten worse. It is the back side of her right shoulder and is going down the shoulder, down the back of the arm all the way to her fingers. Her 4th and 5th digit are getting numbness and tingling  Pain is worse with certain movements. Nothing has stopped the pain per patient. States that with it resting in a 90 degree angle helps. (Like in a sling position) History of neck surgery approx 20 years ago without trouble. No recent injury to neck or shoulder.    Past Medical History:  Diagnosis Date   Atherosclerosis 1/0612014   aorta, iliacs and CFA bilaterally no greater than 0-49% - lower arterial doppler.   Blood transfusion without reported diagnosis 2005   Cancer [facility]) Ovarian 1978   Chest pain    COPD (chronic obstructive pulmonary disease) (HCC)    Emphysema of lung ([place]) 2019   Oxygen deficiency Sept 2020    Past Surgical History:  Procedure Laterality Date   ABDOMINAL HYSTERECTOMY  1978   CHOLECYSTECTOMY  2010   [place]   OVARIAN CYST REMOVAL  1988   SPINE SURGERY  Neck 2002    Family History  Problem Relation Age of Onset   Heart failure Mother    Diabetes Mother    Kidney disease Mother    Hypertension Mother    Arthritis Mother    Heart disease Mother    Obesity Mother    COPD Father    Hearing loss Father    Lung cancer Brother    Asthma Son    Diabetes Son    Birth defects Son    COPD Brother    COPD Brother     Social History   Socioeconomic History   Marital status:  Married    Spouse name: Christia Reading Youth worker)   Number of children: 3   Years of education: Some college   Highest education level: Not on file  Occupational History   Not on file  Tobacco Use   Smoking status: Former    Packs/day: 1.00    Years: 45.00    Pack years: 45.00    Types: Cigarettes    Quit date: 03/18/2016    Years since quitting: 5.3   Smokeless tobacco: Never  Vaping Use   Vaping Use: Never used  Substance and Sexual Activity   Alcohol use: No    Alcohol/week: 0.0 standard drinks   Drug use: No   Sexual activity: Not Currently  Other Topics Concern   Not on file  Social History Narrative   08/27/19   From: the area (but [place] area)   Living: with husband [name] - since 1987   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      Family: Daughter Lynett Fish nearby, son Barbaraann Rondo in [place], and other daughter - 1 hour; 4 grandchildren      Enjoys: crochet, relax at home  Exercise: on hold but has been doing pulmonary rehab   Diet: not currently, but has lost weight      Safety   Seat belts: Yes    Guns: Yes  and secure   Safe in relationships: Yes    Social Determinants of Health   Financial Resource Strain: Not on file  Food Insecurity: Not on file  Transportation Needs: Not on file  Physical Activity: Not on file  Stress: Not on file  Social Connections: Not on file  Intimate Partner Violence: Not on file    Outpatient Medications Prior to Visit  Medication Sig Dispense Refill   albuterol (VENTOLIN HFA) 108 (90 Base) MCG/ACT inhaler Inhale 2 puffs into the lungs every 6 (six) hours as needed for wheezing or shortness of breath. 8 g 2   ASPIRIN LOW DOSE 81 MG EC tablet TAKE 1 TABLET BY MOUTH EVERY DAY 90 tablet 3   atorvastatin (LIPITOR) 10 MG tablet TAKE 1 TABLET BY MOUTH EVERY DAY 90 tablet 2   Azelastine HCl 137 MCG/SPRAY SOLN SPRAY 1 SPRAY INTO EACH NOSTRIL TWICE A DAY 30 mL 1   benzonatate (TESSALON) 100 MG capsule TAKE 1 CAPSULE BY MOUTH  THREE TIMES A DAY AS NEEDED FOR COUGH 90 capsule 0   BREZTRI AEROSPHERE 160-9-4.8 MCG/ACT AERO INHALE 2 PUFFS INTO THE LUNGS IN THE MORNING AND AT BEDTIME. 5.9 g 1   gabapentin (NEURONTIN) 300 MG capsule Take 1 capsule (300 mg total) by mouth 3 (three) times daily. TAKE 1 CAPSULE BY MOUTH EVERY DAY FOR 7 DAYS THEN 1 TABLET BY MOUTH TWICE A DAY AND CONTINUE 90 capsule 5   ipratropium-albuterol (DUONEB) 0.5-2.5 (3) MG/3ML SOLN USE 1 AMPULE IN NEBULIZER 4 TIMES A DAY AS NEEDED (DX: J84.112, J44.9) 360 mL 5   oxybutynin (DITROPAN-XL) 10 MG 24 hr tablet Take 1 tablet (10 mg total) by mouth daily. 30 tablet 11   OXYGEN Inhale into the lungs. 3 liters at rest and 5 liters with any activity.-     pantoprazole (PROTONIX) 40 MG tablet Take 1 tablet (40 mg total) by mouth daily. 30 tablet 11   Respiratory Therapy Supplies (FLUTTER) DEVI Use as directed 1 each 0   Vibegron (GEMTESA) 75 MG TABS Take 75 mg by mouth daily. 30 tablet 0   No facility-administered medications prior to visit.    Allergies  Allergen Reactions   Montelukast     Bad dreams    Review of Systems  Constitutional:  Negative for chills and fever.  Respiratory:  Positive for shortness of breath (baseline per patient).   Cardiovascular:  Negative for chest pain.  Musculoskeletal:  Positive for arthralgias. Negative for joint swelling, myalgias and neck pain.  Neurological:  Positive for weakness and numbness.      Objective:    Physical Exam Vitals and nursing note reviewed.  Constitutional:      Appearance: Normal appearance.  Cardiovascular:     Rate and Rhythm: Normal rate and regular rhythm.     Pulses: Normal pulses.          Radial pulses are 2+ on the right side and 2+ on the left side.     Heart sounds: Normal heart sounds.  Pulmonary:     Effort: Pulmonary effort is normal.  Abdominal:     General: Bowel sounds are normal.  Musculoskeletal:        General: No signs of injury.     Right shoulder: Tenderness  present. No bony tenderness.  Decreased range of motion. Normal strength. Normal pulse.     Left shoulder: Normal.       Arms:  Skin:    General: Skin is warm.  Neurological:     Mental Status: She is alert.     Deep Tendon Reflexes:     Reflex Scores:      Bicep reflexes are 2+ on the right side and 2+ on the left side.    Comments: Bilateral upper extremity 5/5 Patient has limited AROM on right shoulder with flexion. Empty can test was positive Laurene Footman was negative. More pain with outward rotation   Positive tinel's test on right lateral space    BP (!) 162/64    Pulse 83    Temp 97.7 F (36.5 C)    Resp 18    Ht 5' 5.5" (1.664 m)    Wt 172 lb 4 oz (78.1 kg)    SpO2 97% Comment: on 3 liters of O2   BMI 28.23 kg/m  Wt Readings from Last 3 Encounters:  08/09/21 172 lb 4 oz (78.1 kg)  07/28/21 172 lb (78 kg)  07/26/21 178 lb (80.7 kg)    Health Maintenance Due  Topic Date Due   COVID-19 Vaccine (4 - Booster for Moderna series) 08/22/2020   MAMMOGRAM  06/11/2021    There are no preventive care reminders to display for this patient.   Lab Results  Component Value Date   TSH 3.04 03/19/2019   Lab Results  Component Value Date   WBC 11.8 (H) 11/18/2019   HGB 12.1 11/18/2019   HCT 36.2 11/18/2019   MCV 93.9 11/18/2019   PLT 361.0 11/18/2019   Lab Results  Component Value Date   NA 140 08/22/2019   K 4.3 08/22/2019   CO2 28 08/22/2019   GLUCOSE 116 (H) 08/22/2019   BUN 13 08/22/2019   CREATININE 0.73 08/22/2019   BILITOT 0.5 03/19/2019   ALKPHOS 73 03/19/2019   AST 14 03/19/2019   ALT 20 03/19/2019   PROT 6.9 03/19/2019   ALBUMIN 3.7 03/19/2019   CALCIUM 9.7 08/22/2019   ANIONGAP 8 03/03/2019   GFR 80.65 08/22/2019   Lab Results  Component Value Date   CHOL 143 04/02/2020   Lab Results  Component Value Date   HDL 47.50 04/02/2020   Lab Results  Component Value Date   LDLCALC 77 04/02/2020   Lab Results  Component Value Date   TRIG 95.0  04/02/2020   Lab Results  Component Value Date   CHOLHDL 3 04/02/2020   Lab Results  Component Value Date   HGBA1C 6.2 08/29/2019       Assessment & Plan:   Problem List Items Addressed This Visit       Other   Acute pain of right shoulder - Primary    No known injury.  Will obtain pictures of right shoulder prednisone taper for now.  Signs and symptoms reviewed when to seek urgent or emergent health care.      Relevant Medications   predniSONE (DELTASONE) 20 MG tablet   Other Relevant Orders   DG Cervical Spine Complete   DG Shoulder Right   History of cervical spinal surgery    Patient has history of cervical spine surgery.  Pending neck x-ray      Relevant Orders   DG Cervical Spine Complete   Radicular pain in right arm    Patient having radicular pain in right arm.  Does have history of cervical surgery  approximately 20 years ago.  No recent injury to neck or shoulder.  Start prednisone taper as prescribed pending x-ray results.      Relevant Medications   predniSONE (DELTASONE) 20 MG tablet   Other Relevant Orders   DG Cervical Spine Complete   DG Shoulder Right     Meds ordered this encounter  Medications   predniSONE (DELTASONE) 20 MG tablet    Sig: Take 1 tablet (20 mg total) by mouth 2 (two) times daily with a meal for 3 days, THEN 1 tablet (20 mg total) daily with breakfast for 3 days.    Dispense:  9 tablet    Refill:  0    Order Specific Question:   Supervising Provider    Answer:   Loura Pardon A [[id number]]   This visit occurred during the SARS-CoV-2 public health emergency.  Safety protocols were in place, including screening questions prior to the visit, additional usage of staff PPE, and extensive cleaning of exam room while observing appropriate contact time as indicated for disinfecting solutions.    Romilda Garret, NP

## 2021-08-09 NOTE — Assessment & Plan Note (Signed)
Patient having radicular pain in right arm.  Does have history of cervical surgery approximately 20 years ago.  No recent injury to neck or shoulder.  Start prednisone taper as prescribed pending x-ray results.

## 2021-08-09 NOTE — Assessment & Plan Note (Signed)
Patient has history of cervical spine surgery.  Pending neck x-ray

## 2021-08-09 NOTE — Assessment & Plan Note (Addendum)
No known injury.  Will obtain pictures of right shoulder prednisone taper for now.  Signs and symptoms reviewed when to seek urgent or emergent health care.

## 2021-08-09 NOTE — Patient Instructions (Signed)
Nice to see you today I will be in touch once I have the xray results I sent medications in to the pharmacy on file Follow up if no improvement or symptoms get worse

## 2021-08-11 ENCOUNTER — Encounter: Payer: Self-pay | Admitting: Nurse Practitioner

## 2021-08-11 DIAGNOSIS — M792 Neuralgia and neuritis, unspecified: Secondary | ICD-10-CM

## 2021-08-11 MED ORDER — HYDROCODONE-ACETAMINOPHEN 5-325 MG PO TABS: 1.0000 | ORAL_TABLET | Freq: Three times a day (TID) | ORAL | 0 refills | Status: AC | PRN

## 2021-08-11 MED ORDER — BREZTRI AEROSPHERE 160-9-4.8 MCG/ACT IN AERO: 2.0000 | INHALATION_SPRAY | Freq: Two times a day (BID) | RESPIRATORY_TRACT | 11 refills | Status: DC

## 2021-08-12 ENCOUNTER — Other Ambulatory Visit: Payer: Self-pay | Admitting: Nurse Practitioner

## 2021-08-12 DIAGNOSIS — M792 Neuralgia and neuritis, unspecified: Secondary | ICD-10-CM

## 2021-08-17 ENCOUNTER — Other Ambulatory Visit: Payer: Self-pay | Admitting: Internal Medicine

## 2021-08-17 ENCOUNTER — Other Ambulatory Visit: Payer: Self-pay | Admitting: Pulmonary Disease

## 2021-08-17 ENCOUNTER — Other Ambulatory Visit: Payer: Self-pay | Admitting: Adult Health

## 2021-08-17 NOTE — Telephone Encounter (Signed)
Spoke with Tye [place] L with [employer] regarding PA for MR Cervical Spine This has been denied, [facility] is [place] Auth# [id number] Ref # [id number]  Facilities INN are  -[facility], [place] - they do not do MRIs here ([facility] location) **[place]   Location needs to be changed to [place] is going to look further into this and speak with her Supervisors about this credentialing issue to see if they are aware.   Patient is aware of the issue we have run into with [place] - she is not wanting to chance it and would like to go to [facility] if [facility] cannot find a resolution. Patient is aware that when I change the location that I have to resubmit the claim again but that I will submit as urgent to hopefully expedite it. Will keep her posted.   FYI to [place] who has been following this as well

## 2021-08-17 NOTE — Telephone Encounter (Signed)
Noted thank you

## 2021-08-19 ENCOUNTER — Other Ambulatory Visit: Payer: Self-pay

## 2021-08-19 ENCOUNTER — Ambulatory Visit
Admission: RE | Admit: 2021-08-19 | Discharge: 2021-08-19 | Disposition: A | Discharge status: Home / Self Care | Payer: 59 | Source: Ambulatory Visit | Attending: Nurse Practitioner | Admitting: Nurse Practitioner

## 2021-08-19 DIAGNOSIS — M4050 Lordosis, unspecified, site unspecified: Secondary | ICD-10-CM | POA: Diagnosis not present

## 2021-08-19 DIAGNOSIS — M4802 Spinal stenosis, cervical region: Secondary | ICD-10-CM | POA: Insufficient documentation

## 2021-08-19 DIAGNOSIS — M792 Neuralgia and neuritis, unspecified: Secondary | ICD-10-CM | POA: Insufficient documentation

## 2021-08-19 DIAGNOSIS — M2578 Osteophyte, vertebrae: Secondary | ICD-10-CM | POA: Insufficient documentation

## 2021-08-20 ENCOUNTER — Other Ambulatory Visit: Payer: Self-pay | Admitting: Nurse Practitioner

## 2021-08-20 DIAGNOSIS — M792 Neuralgia and neuritis, unspecified: Secondary | ICD-10-CM

## 2021-08-20 DIAGNOSIS — Z9889 Other specified postprocedural states: Secondary | ICD-10-CM

## 2021-08-25 ENCOUNTER — Encounter: Payer: Self-pay | Admitting: *Deleted

## 2021-09-02 MED ORDER — BENZONATATE 100 MG PO CAPS: ORAL_CAPSULE | ORAL | 0 refills | Status: DC

## 2021-09-17 ENCOUNTER — Other Ambulatory Visit: Payer: Self-pay | Admitting: Internal Medicine

## 2021-09-17 ENCOUNTER — Other Ambulatory Visit: Payer: Self-pay | Admitting: Nurse Practitioner

## 2021-09-17 ENCOUNTER — Other Ambulatory Visit: Payer: Self-pay | Admitting: Adult Health

## 2021-09-17 DIAGNOSIS — M792 Neuralgia and neuritis, unspecified: Secondary | ICD-10-CM

## 2021-09-17 DIAGNOSIS — M25511 Pain in right shoulder: Secondary | ICD-10-CM

## 2021-09-27 ENCOUNTER — Other Ambulatory Visit: Payer: Self-pay | Admitting: Internal Medicine

## 2021-10-01 ENCOUNTER — Other Ambulatory Visit: Payer: Self-pay

## 2021-10-01 ENCOUNTER — Ambulatory Visit (INDEPENDENT_AMBULATORY_CARE_PROVIDER_SITE_OTHER): Payer: 59 | Admitting: Family Medicine

## 2021-10-01 VITALS — BP 142/70 | HR 69 | Temp 97.8°F | Ht 65.5 in | Wt 177.0 lb

## 2021-10-01 DIAGNOSIS — Z1231 Encounter for screening mammogram for malignant neoplasm of breast: Secondary | ICD-10-CM | POA: Diagnosis not present

## 2021-10-01 DIAGNOSIS — I700 Atherosclerosis of aorta: Secondary | ICD-10-CM

## 2021-10-01 DIAGNOSIS — Z0000 Encounter for general adult medical examination without abnormal findings: Secondary | ICD-10-CM | POA: Diagnosis not present

## 2021-10-01 DIAGNOSIS — Z1211 Encounter for screening for malignant neoplasm of colon: Secondary | ICD-10-CM | POA: Diagnosis not present

## 2021-10-01 DIAGNOSIS — I951 Orthostatic hypotension: Secondary | ICD-10-CM

## 2021-10-01 DIAGNOSIS — Z720 Tobacco use: Secondary | ICD-10-CM

## 2021-10-01 DIAGNOSIS — E782 Mixed hyperlipidemia: Secondary | ICD-10-CM

## 2021-10-01 DIAGNOSIS — Z131 Encounter for screening for diabetes mellitus: Secondary | ICD-10-CM

## 2021-10-01 DIAGNOSIS — F320 Major depressive disorder, single episode, mild: Secondary | ICD-10-CM | POA: Diagnosis not present

## 2021-10-01 MED ORDER — FLUOXETINE HCL 20 MG PO CAPS: 20.0000 mg | ORAL_CAPSULE | Freq: Every day | ORAL | 1 refills | Status: DC

## 2021-10-01 NOTE — Assessment & Plan Note (Signed)
PHQ-9 is elevated today, patient notes crying regularly.  Discussed starting a medication to help with depression in light of her declining health.  Start fluoxetine 20 mg.  She declined therapy though I encouraged this.  Return in 6 weeks discussed virtual visit was okay. ?

## 2021-10-01 NOTE — Patient Instructions (Addendum)
You can call for a mammogram at these locations: ? ?[place] [facility].  ?[place] ?[phone] ? ?You are going to start a new antidepressant medication.  ? ?One of the risks of this medication is increase in suicidal thoughts.  ? ?Your suicide Action plan is as follows:  ?1) Call family or friend ?2) Call the Suicide Hotline [phone] which is available 24 hours ?3) Call the Clinic ? ? ? ?The most common side effect is stomach upset. If this happens it means the medication is working. It should get better in 1-3 weeks.  ? ?Medication for depression and anxiety often takes 6-8 weeks to have a noticeable difference so stick with it. Also the best way for recovery is taking medication and seeing a therapist -- this is so important.  ? ? ?How to help anxiety and depression ? ?1) Regular Exercise - walking, jogging, cycling, dancing, strength training - aiming for 150 minutes of exercise a week ?--> Yoga has been shown in research to reduce depression and anxiety -- with even just one hour long session per week ? ?2)  Begin a Mindfulness/Meditation practice -- this can take a little as 3 minutes and is helpful for all kinds of mood issues ?-- You can find resources in books ?-- Or you can download apps like  ?---- Headspace App  ?---- Calm  ?---- Insignt Timer ?---- Stop, Breathe & Think ? ?# With each of these Apps - you should decline the "start free trial" offer and as you search through the App should be able to access some of their free content. You can also chose to pay for the content if you find one that works well for you.  ? ?# Many of them also offer sleep specific content which may help with insomnia ? ?3) Healthy Diet ?-- Avoid or decrease Caffeine ?-- Avoid or decrease Alcohol ?-- Drink plenty of water, have a balanced diet ?-- Avoid cigarettes and marijuana (as well as other recreational drugs) ? ?4) Find a therapist  ?-- [facility] is one option. Call [phone] ?-- Or you can check out www.psychologytoday.com -- you can read bios of therapists and see if they accept insurance ?-- Check with your insurance to see if you have coverage and who may take your insurance ? ? ?

## 2021-10-01 NOTE — Progress Notes (Signed)
Annual Exam  ? ?Chief Complaint:  ?Chief Complaint  ?Patient presents with  ? Annual Exam  ? ? ?History of Present Illness:  ?[name] is a 66 y.o. No obstetric history on file. who LMP was No LMP recorded. Patient has had a hysterectomy., presents today for her annual examination.   ? ?Has to go to [facility] to see neurosurgery due to insurance and is going to get a shot in her back ? ?Some days "just sits and cries" ? ?Nutrition ?She does get adequate calcium and Vitamin D in her diet. ?Diet: fruit, veggies, fresh foods ?Exercise: hip and shoulder pain are keeping her from activity ? ? ? ?Social History  ? ?Tobacco Use  ?Smoking Status Former  ? Packs/day: 1.00  ? Years: 45.00  ? Pack years: 45.00  ? Types: Cigarettes  ? Quit date: 03/18/2016  ? Years since quitting: 5.5  ?Smokeless Tobacco Never  ? ?Social History  ? ?Substance and Sexual Activity  ?Alcohol Use No  ? Alcohol/week: 0.0 standard drinks  ? ?Social History  ? ?Substance and Sexual Activity  ?Drug Use No  ? ? ? ?General Health ?Dentist in the last year: dentures ?Eye doctor: yes ? ?Safety ?The patient wears seatbelts: yes.     ?The patient feels safe at home and in their relationships: yes. ? ? ?Menstrual:  ?Symptoms of menopause: rare night sweats ? ? ?GYN ?She is not sexually active.  ? ? ?Cervical Cancer Screening (21-65):   ?S/p hysterectomy  ? ?Breast Cancer Screening (Age 86-74):  ?There is FH of breast cancer. There is no FH of ovarian cancer. BRCA screening Not Indicated.  ?Last Mammogram: 2020 ?The patient does want a mammogram this year.  ? ? ?Colon Cancer Screening:  ?Age 45-75 yo - benefits outweigh the risk. Adults 70-85 yo who have never been screened benefit.  ?Benefits: 134000 people in 2016 will be diagnosed and 49,000 will die - early detection helps ?[place]: Complications 2/2 to colonoscopy ?High Risk (Colonoscopy): genetic disorder (Lynch syndrome or familial adenomatous polyposis), personal hx of IBD, previous adenomatous  polyp, or previous colorectal cancer, FamHx start 10 years before the age at diagnosis, increased in males and black race ? ?Options:  ?FIT - looks for hemoglobin (blood in the stool) - specific and fairly sensitive - must be done annually ?Cologuard - looks for DNA and blood - more sensitive - therefore can have more false positives, every 3 years ?Colonoscopy - every 10 years if normal - sedation, bowl prep, must have someone drive you ? ?Shared decision making and the patient had decided to do colonoscopy - over due. ? ? ?Social History  ? ?Tobacco Use  ?Smoking Status Former  ? Packs/day: 1.00  ? Years: 45.00  ? Pack years: 45.00  ? Types: Cigarettes  ? Quit date: 03/18/2016  ? Years since quitting: 5.5  ?Smokeless Tobacco Never  ? ? ?Lung Cancer Screening (Ages 48-80): yes ?20 year pack history? Yes ?Current Tobacco user? No ?Quit less than 15 years ago? Yes ?Interested in low dose CT for lung cancer screening? yes ? ?Weight ?Wt Readings from Last 3 Encounters:  ?10/01/21 177 lb (80.3 kg)  ?08/09/21 172 lb 4 oz (78.1 kg)  ?07/28/21 172 lb (78 kg)  ? ?Patient has high BMI  ?BMI Readings from Last 1 Encounters:  ?10/01/21 29.01 kg/m?  ? ? ? ?Chronic disease screening ?Blood pressure monitoring:  ?BP Readings from Last 3 Encounters:  ?10/01/21 (!) 142/70  ?08/09/21 Marland Kitchen)  154/62  ?07/28/21 140/60  ? ? ?Lipid Monitoring: Indication for screening: age >32, obesity, diabetes, family hx, CV risk factors.  ?Lipid screening: Yes ? ?Lab Results  ?Component Value Date  ? CHOL 143 04/02/2020  ? HDL 47.50 04/02/2020  ? [place] 77 04/02/2020  ? TRIG 95.0 04/02/2020  ? CHOLHDL 3 04/02/2020  ? ? ? ?Diabetes Screening: age >47, overweight, family hx, PCOS, hx of gestational diabetes, at risk ethnicity ?Diabetes Screening screening: Yes ? ?Lab Results  ?Component Value Date  ? HGBA1C 6.2 08/29/2019  ? ? ? ?Past Medical History:  ?Diagnosis Date  ? Atherosclerosis 1/0612014  ? aorta, iliacs and CFA bilaterally no greater than 0-49% -  lower arterial doppler.  ? Blood transfusion without reported diagnosis 2005  ? [facility]) Ovarian 1978  ? Chest pain   ? COPD (chronic obstructive pulmonary disease) ([place])   ? Emphysema of lung ([place]) 2019  ? Oxygen deficiency Sept 2020  ? ? ?Past Surgical History:  ?Procedure Laterality Date  ? ABDOMINAL HYSTERECTOMY  1978  ? CHOLECYSTECTOMY  2010  ? NECK SURGERY  1999  ? OVARIAN CYST REMOVAL  1988  ? SPINE SURGERY  Neck 2002  ? ? ?Prior to Admission medications   ?Medication Sig Start Date End Date Taking? Authorizing Provider  ?albuterol (VENTOLIN HFA) 108 (90 Base) MCG/ACT inhaler TAKE 2 PUFFS BY MOUTH EVERY 6 HOURS AS NEEDED FOR WHEEZE OR SHORTNESS OF BREATH 09/28/21  Yes Brand Males, MD  ?ASPIRIN LOW DOSE 81 MG EC tablet TAKE 1 TABLET BY MOUTH EVERY DAY 07/26/21  Yes [name], MD  ?atorvastatin (LIPITOR) 10 MG tablet TAKE 1 TABLET BY MOUTH EVERY DAY 02/22/21  Yes [name], MD  ?Azelastine HCl 137 MCG/SPRAY SOLN USE 1 SPRAY INTO EACH NOSTRIL TWICE A DAY 08/17/21  Yes Brand Males, MD  ?benzonatate (TESSALON) 100 MG capsule TAKE 1 CAPSULE BY MOUTH THREE TIMES A DAY AS NEEDED FOR COUGH 09/02/21  Yes Parrett, Fonnie Mu, NP  ?Budeson-Glycopyrrol-Formoterol (BREZTRI AEROSPHERE) 160-9-4.8 MCG/ACT AERO Inhale 2 puffs into the lungs in the morning and at bedtime. 08/11/21  Yes Parrett, Tammy S, NP  ?gabapentin (NEURONTIN) 300 MG capsule Take 1 capsule (300 mg total) by mouth 3 (three) times daily. TAKE 1 CAPSULE BY MOUTH EVERY DAY FOR 7 DAYS THEN 1 TABLET BY MOUTH TWICE A DAY AND CONTINUE 07/28/21  Yes Brand Males, MD  ?ipratropium-albuterol (DUONEB) 0.5-2.5 (3) MG/3ML SOLN USE 1 AMPULE IN NEBULIZER 4 TIMES A DAY AS NEEDED (DX: J84.112, J44.9) 07/26/21  Yes Brand Males, MD  ?OXYGEN Inhale into the lungs. 3 liters at rest and 5 liters with any activity.-   Yes [provider]  ?pantoprazole (PROTONIX) 40 MG tablet Take 1 tablet (40 mg total) by mouth daily. 07/28/21  Yes Brand Males, MD  ?Respiratory Therapy Supplies (FLUTTER) DEVI Use as directed 04/22/19  Yes Martyn Ehrich, NP  ?Vibegron (GEMTESA) 75 MG TABS Take 75 mg by mouth daily. 07/26/21  Yes MacDiarmid, Nicki Reaper, MD  ? ? ?Allergies  ?Allergen Reactions  ? Montelukast   ?  Bad dreams  ? ? ?Gynecologic History: No LMP recorded. Patient has had a hysterectomy. ? ?Obstetric History: No obstetric history on file. ? ?Social History  ? ?Socioeconomic History  ? Marital status: Married  ?  Spouse name: Christia Reading ([place])  ? Number of children: 3  ? Years of education: Some college  ? Highest education level: Not on file  ?Occupational History  ? Not  on file  ?Tobacco Use  ? Smoking status: Former  ?  Packs/day: 1.00  ?  Years: 45.00  ?  Pack years: 45.00  ?  Types: Cigarettes  ?  Quit date: 03/18/2016  ?  Years since quitting: 5.5  ? Smokeless tobacco: Never  ?Vaping Use  ? Vaping Use: Never used  ?Substance and Sexual Activity  ? Alcohol use: No  ?  Alcohol/week: 0.0 standard drinks  ? Drug use: No  ? Sexual activity: Not Currently  ?Other Topics Concern  ? Not on file  ?Social History Narrative  ? 08/27/19  ? From: the area (but [place] area[place]  ? Living: with husband [name] - since 75  ? 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   ? Family: Daughter Lynett Fish nearby, son Barbaraann Rondo in [place], and other daughter - 1 hour; 4 grandchildren  ?   ? Enjoys: crochet, relax at home  ?   ? Exercise: on hold but has been doing pulmonary rehab  ? Diet: not currently, but has lost weight  ?   ? Safety  ? Seat belts: Yes   ? Guns: Yes  and secure  ? Safe in relationships: Yes   ? ?Social Determinants of Health  ? ?Financial Resource Strain: Not on file  ?Food Insecurity: Not on file  ?Transportation Needs: Not on file  ?Physical Activity: Not on file  ?Stress: Not on file  ?Social Connections: Not on file  ?Intimate Partner Violence: Not on file  ? ? ?Family History  ?Problem Relation Age of Onset  ? Heart failure Mother   ? Diabetes Mother    ? Kidney disease Mother   ? Hypertension Mother   ? Arthritis Mother   ? Heart disease Mother   ? Obesity Mother   ? COPD Father   ? Hearing loss Father   ? Lung cancer Brother   ? Asthma Son   ? Diabete

## 2021-10-02 ENCOUNTER — Other Ambulatory Visit: Payer: Self-pay | Admitting: Adult Health

## 2021-10-08 ENCOUNTER — Telehealth: Payer: Self-pay

## 2021-10-08 NOTE — Telephone Encounter (Signed)
CALLED PATIENT NO ANSWER LEFT VOICEMAIL FOR A CALL BACK ? ?

## 2021-10-11 ENCOUNTER — Other Ambulatory Visit: Payer: Self-pay

## 2021-10-11 DIAGNOSIS — Z86010 Personal history of colonic polyps: Secondary | ICD-10-CM

## 2021-10-11 MED ORDER — NA SULFATE-K SULFATE-MG SULF 17.5-3.13-1.6 GM/177ML PO SOLN: 1.0000 | Freq: Once | ORAL | 0 refills | Status: AC

## 2021-10-11 NOTE — Progress Notes (Signed)
Gastroenterology Pre-Procedure Review ? ?Request Date: 11/01/2021 ?Requesting Physician: Dr. Vicente Males ? ?PATIENT REVIEW QUESTIONS: The patient responded to the following health history questions as indicated:   ? ?1. Are you having any GI issues? no ?2. Do you have a personal history of Polyps? yes (last colonoscopy) ?3. Do you have a family history of Colon Cancer or Polyps? yes (polyps) ?4. Diabetes Mellitus? no ?5. Joint replacements in the past 12 months?no ?6. Major health problems in the past 3 months?copd ?7. Any artificial heart valves, MVP, or defibrillator?no ?   ?MEDICATIONS & ALLERGIES:    ?Patient reports the following regarding taking any anticoagulation/antiplatelet therapy:   ?Plavix, Coumadin, Eliquis, Xarelto, Lovenox, Pradaxa, Brilinta, or Effient? no ?Aspirin? yes (81 mg) ? ?Patient confirms/reports the following medications:  ?Current Outpatient Medications  ?Medication Sig Dispense Refill  ? albuterol (VENTOLIN HFA) 108 (90 Base) MCG/ACT inhaler TAKE 2 PUFFS BY MOUTH EVERY 6 HOURS AS NEEDED FOR WHEEZE OR SHORTNESS OF BREATH 8.5 each 5  ? ASPIRIN LOW DOSE 81 MG EC tablet TAKE 1 TABLET BY MOUTH EVERY DAY 90 tablet 3  ? atorvastatin (LIPITOR) 10 MG tablet TAKE 1 TABLET BY MOUTH EVERY DAY 90 tablet 2  ? Azelastine HCl 137 MCG/SPRAY SOLN USE 1 SPRAY INTO EACH NOSTRIL TWICE A DAY 30 mL 1  ? benzonatate (TESSALON) 100 MG capsule TAKE 1 CAPSULE BY MOUTH 3 TIMES A DAY AS NEEDED FOR COUGH 90 capsule 0  ? Budeson-Glycopyrrol-Formoterol (BREZTRI AEROSPHERE) 160-9-4.8 MCG/ACT AERO Inhale 2 puffs into the lungs in the morning and at bedtime. 10.7 g 11  ? FLUoxetine (PROZAC) 20 MG capsule Take 1 capsule (20 mg total) by mouth daily. 30 capsule 1  ? gabapentin (NEURONTIN) 300 MG capsule Take 1 capsule (300 mg total) by mouth 3 (three) times daily. TAKE 1 CAPSULE BY MOUTH EVERY DAY FOR 7 DAYS THEN 1 TABLET BY MOUTH TWICE A DAY AND CONTINUE 90 capsule 5  ? ipratropium-albuterol (DUONEB) 0.5-2.5 (3) MG/3ML SOLN USE 1  AMPULE IN NEBULIZER 4 TIMES A DAY AS NEEDED (DX: J84.112, J44.9) 360 mL 5  ? OXYGEN Inhale into the lungs. 3 liters at rest and 5 liters with any activity.-    ? pantoprazole (PROTONIX) 40 MG tablet Take 1 tablet (40 mg total) by mouth daily. 30 tablet 11  ? Respiratory Therapy Supplies (FLUTTER) DEVI Use as directed 1 each 0  ? Vibegron (GEMTESA) 75 MG TABS Take 75 mg by mouth daily. 30 tablet 0  ? ?No current facility-administered medications for this visit.  ? ? ?Patient confirms/reports the following allergies:  ?Allergies  ?Allergen Reactions  ? Montelukast   ?  Bad dreams  ? ? ?No orders of the defined types were placed in this encounter. ? ? ?AUTHORIZATION INFORMATION ?Primary Insurance: ?1D#: ?Group #: ? ?Secondary Insurance: ?1D#: ?Group #: ? ?SCHEDULE INFORMATION: ?Date: 11/01/2021 ?Time: ?Location:[facility] ? ?

## 2021-10-15 ENCOUNTER — Telehealth: Payer: Self-pay | Admitting: Adult Health

## 2021-10-15 ENCOUNTER — Other Ambulatory Visit (INDEPENDENT_AMBULATORY_CARE_PROVIDER_SITE_OTHER): Payer: 59

## 2021-10-15 DIAGNOSIS — I951 Orthostatic hypotension: Secondary | ICD-10-CM

## 2021-10-15 DIAGNOSIS — I700 Atherosclerosis of aorta: Secondary | ICD-10-CM | POA: Diagnosis not present

## 2021-10-15 DIAGNOSIS — Z131 Encounter for screening for diabetes mellitus: Secondary | ICD-10-CM

## 2021-10-15 DIAGNOSIS — E782 Mixed hyperlipidemia: Secondary | ICD-10-CM | POA: Diagnosis not present

## 2021-10-15 LAB — CBC
HCT: 38.4 % (ref 36.0–46.0)
Hemoglobin: 12.8 g/dL (ref 12.0–15.0)
MCHC: 33.3 g/dL (ref 30.0–36.0)
MCV: 95.9 fl (ref 78.0–100.0)
Platelets: 336 10*3/uL (ref 150.0–400.0)
RBC: 4 Mil/uL (ref 3.87–5.11)
RDW: 13.9 % (ref 11.5–15.5)
WBC: 10.8 10*3/uL — ABNORMAL HIGH (ref 4.0–10.5)

## 2021-10-15 LAB — LIPID PANEL
Cholesterol: 153 mg/dL (ref 0–200)
HDL: 55.6 mg/dL (ref >39.00)
LDL Cholesterol: 87 mg/dL (ref 0–99)
NonHDL: 97.3
Total CHOL/HDL Ratio: 3
Triglycerides: 54 mg/dL (ref 0.0–149.0)
VLDL: 10.8 mg/dL (ref 0.0–40.0)

## 2021-10-15 LAB — COMPREHENSIVE METABOLIC PANEL
ALT: 17 U/L (ref 0–35)
AST: 20 U/L (ref 0–37)
Albumin: 4.2 g/dL (ref 3.5–5.2)
Alkaline Phosphatase: 72 U/L (ref 39–117)
BUN: 17 mg/dL (ref 6–23)
CO2: 29 mEq/L (ref 19–32)
Calcium: 9.7 mg/dL (ref 8.4–10.5)
Chloride: 103 mEq/L (ref 96–112)
Creatinine, Ser: 0.72 mg/dL (ref 0.40–1.20)
GFR: 88.21 mL/min (ref >60.00)
Glucose, Bld: 102 mg/dL — ABNORMAL HIGH (ref 70–99)
Potassium: 4.4 mEq/L (ref 3.5–5.1)
Sodium: 140 mEq/L (ref 135–145)
Total Bilirubin: 0.5 mg/dL (ref 0.2–1.2)
Total Protein: 7 g/dL (ref 6.0–8.3)

## 2021-10-15 LAB — HEMOGLOBIN A1C: Hgb A1c MFr Bld: 6 % (ref 4.6–6.5)

## 2021-10-15 NOTE — Telephone Encounter (Signed)
Patient has not been seen in office since 07/28/21. Pt is scheduled for an OV with MR 4/5. Will route this to myself as an FYI to get this taken care of then. ?

## 2021-10-18 ENCOUNTER — Ambulatory Visit: Payer: 59 | Admitting: Internal Medicine

## 2021-10-20 ENCOUNTER — Ambulatory Visit (INDEPENDENT_AMBULATORY_CARE_PROVIDER_SITE_OTHER): Payer: 59 | Admitting: Internal Medicine

## 2021-10-20 ENCOUNTER — Encounter: Payer: Self-pay | Admitting: Internal Medicine

## 2021-10-20 VITALS — BP 134/72 | HR 79 | Temp 98.1°F | Ht 65.5 in | Wt 171.2 lb

## 2021-10-20 DIAGNOSIS — J449 Chronic obstructive pulmonary disease, unspecified: Secondary | ICD-10-CM

## 2021-10-20 DIAGNOSIS — Z5989 Other problems related to housing and economic circumstances: Secondary | ICD-10-CM

## 2021-10-20 DIAGNOSIS — J9611 Chronic respiratory failure with hypoxia: Secondary | ICD-10-CM

## 2021-10-20 DIAGNOSIS — Z148 Genetic carrier of other disease: Secondary | ICD-10-CM

## 2021-10-20 DIAGNOSIS — J441 Chronic obstructive pulmonary disease with (acute) exacerbation: Secondary | ICD-10-CM

## 2021-10-20 DIAGNOSIS — Z129 Encounter for screening for malignant neoplasm, site unspecified: Secondary | ICD-10-CM

## 2021-10-20 DIAGNOSIS — Z87891 Personal history of nicotine dependence: Secondary | ICD-10-CM

## 2021-10-20 LAB — CBC WITH DIFFERENTIAL/PLATELET
Basophils Absolute: 0.1 10*3/uL (ref 0.0–0.1)
Basophils Relative: 0.4 % (ref 0.0–3.0)
Eosinophils Absolute: 0.1 10*3/uL (ref 0.0–0.7)
Eosinophils Relative: 0.5 % (ref 0.0–5.0)
HCT: 39.4 % (ref 36.0–46.0)
Hemoglobin: 13 g/dL (ref 12.0–15.0)
Lymphocytes Relative: 17.5 % (ref 12.0–46.0)
Lymphs Abs: 2.6 10*3/uL (ref 0.7–4.0)
MCHC: 33 g/dL (ref 30.0–36.0)
MCV: 95.8 fl (ref 78.0–100.0)
Monocytes Absolute: 0.7 10*3/uL (ref 0.1–1.0)
Monocytes Relative: 4.5 % (ref 3.0–12.0)
Neutro Abs: 11.6 10*3/uL — ABNORMAL HIGH (ref 1.4–7.7)
Neutrophils Relative %: 77.1 % — ABNORMAL HIGH (ref 43.0–77.0)
Platelets: 347 10*3/uL (ref 150.0–400.0)
RBC: 4.11 Mil/uL (ref 3.87–5.11)
RDW: 13.7 % (ref 11.5–15.5)
WBC: 15 10*3/uL — ABNORMAL HIGH (ref 4.0–10.5)

## 2021-10-20 MED ORDER — ROFLUMILAST 250 MCG PO TABS: 1.0000 | ORAL_TABLET | Freq: Every day | ORAL | 0 refills | Status: DC

## 2021-10-20 MED ORDER — ROFLUMILAST 500 MCG PO TABS: 500.0000 ug | ORAL_TABLET | Freq: Every day | ORAL | 11 refills | Status: DC

## 2021-10-20 NOTE — Patient Instructions (Addendum)
?  Stage 2 moderate COPD by GOLD classification ([place]) ?Chronic respiratory failure with hypoxia (HCC) ?Alpha-1-antitrypsin deficiency carrier ? ?- stable disease but needing 3L [place] at rest and 5L [place] with eertion ? ?Plan ? - Cotniue breztri daily with alb as needed ? - cotninue o2 as before ?- do qualityfing walk for o2 10/20/2021 ?- check ABG - if co2 hight - you qualify for bipapa ? ?CHronic cough due to above and cough neuropathy ? ?Plan ? -  ?Robitussin DM 2 tsp every 4hrs for cough As needed   ?Tessalon Three times a day  For cough As needed   ?Chlortrimeton (Chlorpheniramine )  '4mg'$  At bedtime   ?Continue on Gabapentin '300mg'$  Three times a day   ?Voice rest and sips of water to soothe throat   ?Continue PPI ? ?COPD, frequent exacerbations ([place]) ? ?- check with cbc with diff - to see if you quyalify for dupixnt In future ?- start Take new tablet called roflumilast 1 tablet  ? - starter pack 286mg per day after food x 30 days or 1 month ? - then escalate to 5069m daily after food x continue at this dose ? - any side effects especially GI call [place]- discussed ARNASA study as possibility in future ? ? ?Stopped smoking with greater than 40 pack year history ?Cancer screening ? ?- do CT chst without contrast next 1-6 months ? ?Insurance coverage problems ? ?- refer [facility] [place] [facility] ? ? ?[place] ? ? - August 2023 or sooner if needed ? ? ?

## 2021-10-20 NOTE — Telephone Encounter (Signed)
Pt came in for an OV with MR 4/5 and was walked during that OV. Order was placed for pt's switch to [place]. Nothing further needed. ?

## 2021-10-20 NOTE — Addendum Note (Signed)
Addended by: Lorretta Harp on: 10/20/2021 10:06 AM ? ? Modules accepted: Orders ? ?

## 2021-10-20 NOTE — Progress Notes (Signed)
? ? ? ? ?Subjective:  ?Patient ID: Maria [name], female , DOB: 1956-06-14 , age 66 y.o. , MRN: [id number] , ADDRESS: [place] ?[place] [id number] ? ? ?03/19/2019 -   ?Chief Complaint  ?Patient presents with  ? Hospitalization Follow-up  ?  Post covid symptoms  ? ?Post COVID  follow-up in the pulmonary office ? ?HPI ?Maria [name] 66 y.o. 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  And also poor intake.  Was diagnosed to have acute COVID-19.  She remembers being on oxygen.  At the time prior to admission she is had chronic hip pain based on her history and review of the records for which she was using a cane.  She spent a total of 5 days at [facility] the [place] [facility] and was discharged on March 03, 2019.  She was treated with oxygen, Solu-Medrol and REM does Advair.  Last set of laboratories reviewed on March 03, 2019 which showed that she had normal hemoglobin and creatinine and his CRP had improved.  The only chest x-ray that was available is 1 on admission in February 26, 2019 but she has some basal infiltrates. ? ?She now presents to the pulmonary clinic on March 19, 2019.  It is 21 days since admission and she is over 21 days since her first set of symptoms.  She tells me that she is a lot more fatigue.  She is so fatigued that she is not even able to take her inhaler properly which is Trelegy for her COPD.  She feels short of breath all the time.  She is requiring a lot of help taking a shower.  Her hip pain continues.  She is now needing the use of a walker.  Her ECOG is 4.  Her appetite is good.  She is currently not using oxygen either at night or in the daytime. ? ? ?Results for Maria, [name]" (MRN [id number]) as of  08/22/2019 12:57 ? Ref. Range 03/01/2019 03:22 03/02/2019 01:55 03/03/2019 01:54  ?D-Dimer, Quant Latest Ref Range: 0.00 - 0.50 ug/mL-FEU 1.34 (H) 1.12 (H) 1.28 (H)  ? ? ? ? ? ?04/03/2019 ?Presents today for a 2-week follow-up.  Echocardiogram showed normal systolic function with estimated EF 60 to 65%.  Left ventricle diastolic doppler consistent with impaired relaxation-determinant filling pressures.  Right ventricle with normal systolic function, increased wall thickness-systolic pressure could not be assessed.  HRCT is scheduled for 9/29.  Patient called on 9/4 asking about portable oxygen tank order.  Reports that her O2 level has read 84 to 85% after exertion with a heart rate of 110.  She reports that her oxygen level recovers greater than 90% after resting.  Is not currently on oxygen at home will need to be walked in office to qualify for POC. Repeat COVID testing was positive, patient reports that she still has lost her taste and smell with increased shortness of breath.  ? ?She is feeling a little better today. Family is checking on her. No other help at home. Reports feeling fine when sitting but her O2 drops as low as 85% on exertion. She is able to recover with rest. Taking prednisone taper as prescribed, feels it has helped her chest tightness. She has dry/hacking  cough with no mucus production. She is eating and drinking ok. Denies fever.  ? ?04/09/2019 ?Patient presents today for 1 week follow-up. Unfortunately we were unable to get her set up with home health d/t her insurance. She is here today for qualifying O2 walk. She continues to have some shortness of breath and dry cough. Low energy. Her taste and smell as returned some. She is drinking plenty of fluids. ?Reports that her cough is not quite as bad, states the more she talks she has to cough. Has trouble sleeping at night d/t her respiratory symptoms. Feels delsym and tessalon perles have been helpful. ONO on RA showed O2 low 70% with baseline 87%.  Ambulatory O2 today showed O2 desaturation 84% on RA, requiring 3L. Using aero care for nebulizer supplies. Denies fever, weight loss, hemoptysis, purulent mucus.  ? ?04/22/2019 ?Patient presents today for 2 week follow-up. Breathing is the same. Continues to have a dry cough. Completed prednisone 2 days ago. Using incentive spirometry. She has tried to go without her oxygen at home but states that she gets too short winded. 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 Diffuse bronchial wall thickening with moderate centrilobular and paraseptal emphysema; imaging findings suggestive of underlying COPD. She had COPD symptoms before getting COVID. Her breathing was getting a little worse before her diagnosis.  ?Sleeping better at night with oxygen. Ambulating with rolling walker, still deconditioned/weak. Due for influenza vaccine today.  ? ?Oxygen readings:  ?Resting O2 93% on RA  ?Ambulatory O2 98-100 on 3L ?Ambulatory O2 85% on RA ? ?OV 07/03/2019 ? ?Subjective:  ?Patient ID: Maria [name], female , DOB: 07/28/1955 , age 66 y.o. , MRN: [id number] , ADDRESS: [place] ?[place] [id number] ? ? ?07/03/2019 -   ?Chief Complaint  ?Patient presents with  ? Follow-up  ?  PFT performed 12/7 and pt is here following up after that. Pt states she is still becoming SOB with exertion.  ?Follow-up emphysema ?Follow-up interstitial lung disease post COVID-19 in August 2020 (had elevatd d-dimer) ?Follow-up chronic hypoxemic respiratory failure secondary to both ? ? ?HPI ?Maria [name] 66 y.o. -presents for the above issues.  She says her condition is returned to normal.  She is back working as a Forensic psychologist.  However she continues to be still significantly symptomatic.  She had a pulmonary function test June 24, 2019.  I visualized the result.  Shows isolated reduction in diffusion capacity  was only moderate at 57%.  FVC and FEV1 are normal.  She continues to be significantly symptomatic as documented below.  She is taking Trelegy.  She says she desaturates to 85% with exertion echocardiogram recently was normal.  She is asking if she should take the [place] COVID-19 vaccine available in January 2021 ? ? ? ? ? ? ? ? ?IMPRESSION: CT chest 04/16/2019 ?1. Although there are some fibrotic changes in the lower lobes of ?the lungs bilaterally, the distribution in the appearance favors ?areas of post infectious or inflammatory fibrosis. Given the ?presence of some bronchiectasis, this is technically characterized ?as probable UIP per current ATS guidelines, however, that is not ?favored. If there is persistent clinical concern for interstitial ?lung disease, repeat high-resolution chest CT would be recommended ?in 12 months to assess for temporal changes in the appearance of the ?lung parenchyma. ?2. Diffuse bronchial wall thickening with moderate centrilobular and ?paraseptal  emphysema; imaging findings suggestive of underlying ?COPD. ?3. Aortic atherosclerosis, in addition to right coronary artery ?disease. Please note that although the presence of coronary artery ?calcium documents the presence of coronary artery disease, the ?severity of this disease and any potential stenosis cannot be ?assessed on this non-gated CT examination. Assessment for potential ?risk factor modification, dietary therapy or pharmacologic therapy ?may be warranted, if clinically indicated. ?  ?Aortic Atherosclerosis (ICD10-I70.0) and Emphysema (ICD10-J43.9). ?  ?  ?Electronically Signed ?  By: Vinnie Langton M.D. ?  On: 04/16/2019 13:12 ? ? ?OV 08/22/2019 ? ?Subjective:  ?Patient ID: Maria [name], female , DOB: [birth year] , age 67 y.o. , MRN: [id number] , ADDRESS: [place] ?[place] [address] ? ? ?08/22/2019 -   ?Chief Complaint  ?Patient presents with  ? Follow-up  ?  History of 2019 Novel coronavirus disease (COVID-19)   ? ?Follow-up emphysema - alpha 1 MZ in dec 2020 ?Follow-up interstitial lung disease post COVID-19 in August 2020 ?Follow-up chronic hypoxemic respiratory failure secondary to both ?Ex smoker ?Echo sept 2

## 2021-10-24 ENCOUNTER — Other Ambulatory Visit: Payer: Self-pay | Admitting: Family Medicine

## 2021-10-24 DIAGNOSIS — F320 Major depressive disorder, single episode, mild: Secondary | ICD-10-CM

## 2021-10-25 ENCOUNTER — Ambulatory Visit (INDEPENDENT_AMBULATORY_CARE_PROVIDER_SITE_OTHER): Payer: 59 | Admitting: Family Medicine

## 2021-10-25 ENCOUNTER — Encounter: Payer: Self-pay | Admitting: Family Medicine

## 2021-10-25 ENCOUNTER — Other Ambulatory Visit: Payer: Self-pay | Admitting: Family Medicine

## 2021-10-25 VITALS — BP 142/60 | HR 81 | Temp 98.2°F | Ht 65.5 in | Wt 167.0 lb

## 2021-10-25 DIAGNOSIS — G4483 Primary cough headache: Secondary | ICD-10-CM | POA: Diagnosis not present

## 2021-10-25 DIAGNOSIS — I700 Atherosclerosis of aorta: Secondary | ICD-10-CM

## 2021-10-25 DIAGNOSIS — J9611 Chronic respiratory failure with hypoxia: Secondary | ICD-10-CM | POA: Diagnosis not present

## 2021-10-25 DIAGNOSIS — J441 Chronic obstructive pulmonary disease with (acute) exacerbation: Secondary | ICD-10-CM | POA: Diagnosis not present

## 2021-10-25 LAB — POC INFLUENZA A&B (BINAX/QUICKVUE): Influenza A, POC: NEGATIVE

## 2021-10-25 MED ORDER — AZITHROMYCIN 250 MG PO TABS: ORAL_TABLET | ORAL | 0 refills | Status: AC

## 2021-10-25 MED ORDER — PREDNISONE 20 MG PO TABS: 20.0000 mg | ORAL_TABLET | Freq: Every day | ORAL | 0 refills | Status: DC

## 2021-10-25 NOTE — Patient Instructions (Addendum)
Continue nebulizer - see if you can do 4 times daily for 1-2 days ? ?Antibiotic and steroids ? ?If no improvement - call and will plan for x-ray ? ?If worsening - return for visit or see pulmonology or urgent care ?

## 2021-10-25 NOTE — Assessment & Plan Note (Signed)
Symptoms consistent with COPD exacerbation complicated by chronic oxygen use.  She is needing more of her home oxygen up to 5 L at rest.  Also with wheezing and productive cough.  Reviewed chart last COPD exacerbation did recover well with prednisone 20 mg x 5 days and azithromycin.  Advised calling if no improvement in 2 days and will get chest x-ray.  Reevaluation if worsening urgent care and ER precautions.  Also advised increasing DuoNeb to 4 times daily for the next 1 to 2 days. ?

## 2021-10-25 NOTE — Telephone Encounter (Signed)
Is this okay to refill ? ? ?Last OV 10/25/21 ?

## 2021-10-25 NOTE — Progress Notes (Signed)
? ?Subjective:  ? ?  ?Maria [name] is a 66 y.o. female presenting for Cough (Started on last Thursday taking otc cough medication  no helping, chills, took to covid both were neg, cough with /mucus, headaches ) ?  ? ? ?Cough ?This is a new problem. Episode onset: 10/21/2021. The cough is Productive of sputum. Associated symptoms include chills, headaches, myalgias, nasal congestion, a sore throat, shortness of breath and wheezing. Pertinent negatives include no ear congestion, ear pain or fever. Her past medical history is significant for COPD.  ? ?Has also increased oxygen ?No leg swelling  ?Has noticed some new bruising  ? ?Has been using nebulizer 3 times daily ? ?Review of Systems  ?Constitutional:  Positive for chills and fatigue. Negative for fever.  ?HENT:  Positive for sore throat. Negative for ear pain.   ?Respiratory:  Positive for cough, shortness of breath and wheezing.   ?Musculoskeletal:  Positive for myalgias.  ?Neurological:  Positive for headaches.  ? ? ?Social History  ? ?Tobacco Use  ?Smoking Status Former  ? Packs/day: 1.00  ? Years: 45.00  ? Pack years: 45.00  ? Types: Cigarettes  ? Quit date: 03/18/2016  ? Years since quitting: 5.6  ?Smokeless Tobacco Never  ? ? ? ?   ?Objective:  ?  ?BP Readings from Last 3 Encounters:  ?10/25/21 (!) 142/60  ?10/20/21 134/72  ?10/01/21 (!) 142/70  ? ?Wt Readings from Last 3 Encounters:  ?10/25/21 167 lb (75.8 kg)  ?10/20/21 171 lb 3.2 oz (77.7 kg)  ?10/01/21 177 lb (80.3 kg)  ? ? ?BP (!) 142/60   Pulse 81   Temp 98.2 ?F (36.8 ?C)   Ht 5' 5.5" (1.664 m)   Wt 167 lb (75.8 kg)   SpO2 98% Comment: 2L OF OXYGEN  BMI 27.37 kg/m?  ? ? ?Physical Exam ?Constitutional:   ?   General: She is not in acute distress. ?   Appearance: She is well-developed. She is ill-appearing. She is not diaphoretic.  ?HENT:  ?   Right Ear: External ear normal.  ?   Left Ear: External ear normal.  ?   Nose: Nose normal.  ?Eyes:  ?   Conjunctiva/sclera: Conjunctivae normal.   ?Cardiovascular:  ?   Rate and Rhythm: Regular rhythm. Tachycardia present.  ?   Comments: distant ?Pulmonary:  ?   Effort: Pulmonary effort is normal. Tachypnea present. No accessory muscle usage.  ?   Breath sounds: Decreased air movement present. Decreased breath sounds, wheezing and rhonchi present.  ?Musculoskeletal:  ?   Cervical back: Neck supple.  ?Skin: ?   General: Skin is warm and dry.  ?   Capillary Refill: Capillary refill takes less than 2 seconds.  ?Neurological:  ?   Mental Status: She is alert. Mental status is at baseline.  ?Psychiatric:     ?   Mood and Affect: Mood normal.     ?   Behavior: Behavior normal.  ? ? ? ? ? ?   ?Assessment & Plan:  ? ?Problem List Items Addressed This Visit   ? ?  ? Respiratory  ? Chronic respiratory failure with hypoxia (HCC)  ? COPD with acute exacerbation ([place]) - Primary  ?  Symptoms consistent with COPD exacerbation complicated by chronic oxygen use.  She is needing more of her home oxygen up to 5 L at rest.  Also with wheezing and productive cough.  Reviewed chart last COPD exacerbation did recover well with prednisone 20 mg x  5 days and azithromycin.  Advised calling if no improvement in 2 days and will get chest x-ray.  Reevaluation if worsening urgent care and ER precautions.  Also advised increasing DuoNeb to 4 times daily for the next 1 to 2 days. ?  ?  ? Relevant Medications  ? azithromycin (ZITHROMAX) 250 MG tablet  ? predniSONE (DELTASONE) 20 MG tablet  ? ?Other Visit Diagnoses   ? ? Cough headache      ? Relevant Orders  ? POC Influenza A&B(BINAX/QUICKVUE) (Completed)  ? ?  ? ? ? ?Return if symptoms worsen or fail to improve. ? ?[name], MD ? ? ? ?

## 2021-10-29 ENCOUNTER — Ambulatory Visit (HOSPITAL_COMMUNITY)
Admission: RE | Admit: 2021-10-29 | Discharge: 2021-10-29 | Disposition: A | Discharge status: Home / Self Care | Payer: 59 | Source: Ambulatory Visit | Attending: Internal Medicine | Admitting: Internal Medicine

## 2021-10-29 DIAGNOSIS — J449 Chronic obstructive pulmonary disease, unspecified: Secondary | ICD-10-CM

## 2021-10-29 LAB — BLOOD GAS, ARTERIAL
Acid-Base Excess: 4.1 mmol/L — ABNORMAL HIGH (ref 0.0–2.0)
Bicarbonate: 28.5 mmol/L — ABNORMAL HIGH (ref 20.0–28.0)
Drawn by: 211791
O2 Saturation: 98.6 %
Patient temperature: 37
pCO2 arterial: 41 mmHg (ref 32–48)
pH, Arterial: 7.45 (ref 7.35–7.45)
pO2, Arterial: 101 mmHg (ref 83–108)

## 2021-10-29 NOTE — Progress Notes (Signed)
No co2 retetnion on ABG. No need for night bipap

## 2021-11-01 ENCOUNTER — Encounter: Payer: Self-pay | Admitting: Gastroenterology

## 2021-11-01 ENCOUNTER — Ambulatory Visit: Payer: 59 | Admitting: Registered Nurse

## 2021-11-01 ENCOUNTER — Ambulatory Visit
Admission: RE | Admit: 2021-11-01 | Discharge: 2021-11-01 | Disposition: A | Discharge status: Home / Self Care | Payer: 59 | Attending: Gastroenterology | Admitting: Gastroenterology

## 2021-11-01 ENCOUNTER — Other Ambulatory Visit: Payer: Self-pay

## 2021-11-01 ENCOUNTER — Encounter
Admission: RE | Disposition: A | Discharge status: Home / Self Care | Payer: Self-pay | Source: Home / Self Care | Attending: Gastroenterology

## 2021-11-01 DIAGNOSIS — J439 Emphysema, unspecified: Secondary | ICD-10-CM | POA: Diagnosis not present

## 2021-11-01 DIAGNOSIS — I700 Atherosclerosis of aorta: Secondary | ICD-10-CM | POA: Diagnosis not present

## 2021-11-01 DIAGNOSIS — Z9981 Dependence on supplemental oxygen: Secondary | ICD-10-CM | POA: Insufficient documentation

## 2021-11-01 DIAGNOSIS — Z09 Encounter for follow-up examination after completed treatment for conditions other than malignant neoplasm: Secondary | ICD-10-CM | POA: Insufficient documentation

## 2021-11-01 DIAGNOSIS — D122 Benign neoplasm of ascending colon: Secondary | ICD-10-CM | POA: Insufficient documentation

## 2021-11-01 DIAGNOSIS — I70209 Unspecified atherosclerosis of native arteries of extremities, unspecified extremity: Secondary | ICD-10-CM | POA: Diagnosis not present

## 2021-11-01 DIAGNOSIS — K635 Polyp of colon: Secondary | ICD-10-CM

## 2021-11-01 DIAGNOSIS — Z86010 Personal history of colonic polyps: Secondary | ICD-10-CM | POA: Diagnosis not present

## 2021-11-01 DIAGNOSIS — I708 Atherosclerosis of other arteries: Secondary | ICD-10-CM | POA: Insufficient documentation

## 2021-11-01 DIAGNOSIS — Z87891 Personal history of nicotine dependence: Secondary | ICD-10-CM | POA: Diagnosis not present

## 2021-11-01 DIAGNOSIS — Z8543 Personal history of malignant neoplasm of ovary: Secondary | ICD-10-CM | POA: Diagnosis not present

## 2021-11-01 HISTORY — DX: Post covid-19 condition, unspecified: U09.9

## 2021-11-01 HISTORY — DX: Other forms of dyspnea: R06.09

## 2021-11-01 HISTORY — PX: COLONOSCOPY WITH PROPOFOL: SHX5780

## 2021-11-01 HISTORY — DX: Dependence on supplemental oxygen: Z99.81

## 2021-11-01 SURGERY — COLONOSCOPY WITH PROPOFOL
Anesthesia: General

## 2021-11-01 MED ORDER — PROPOFOL 500 MG/50ML IV EMUL
INTRAVENOUS | Status: DC | PRN
  Administered 2021-11-01: 150 ug/kg/min via INTRAVENOUS

## 2021-11-01 MED ORDER — SODIUM CHLORIDE 0.9 % IV SOLN: INTRAVENOUS | Status: DC

## 2021-11-01 MED ORDER — PROPOFOL 10 MG/ML IV BOLUS
INTRAVENOUS | Status: DC | PRN
  Administered 2021-11-01: 70 mg via INTRAVENOUS

## 2021-11-01 MED ORDER — EPHEDRINE SULFATE (PRESSORS) 50 MG/ML IJ SOLN
INTRAMUSCULAR | Status: DC | PRN
  Administered 2021-11-01 (×3): 5 mg via INTRAVENOUS

## 2021-11-01 MED ORDER — LIDOCAINE HCL (CARDIAC) PF 100 MG/5ML IV SOSY
PREFILLED_SYRINGE | INTRAVENOUS | Status: DC | PRN
  Administered 2021-11-01: 100 mg via INTRAVENOUS

## 2021-11-01 NOTE — H&P (Signed)
? ? ? ?Jonathon Bellows, MD ?[address], [place], [place], [place], [address] ?[address], [place], [place], [place], [address] ?Phone: [phone]  ?Fax: [phone] ? ?Primary Care Physician:  [name], MD ? ? ?Pre-Procedure History & Physical: ?HPI:  Maria [name] is a 66 y.o. female is here for an colonoscopy. ?  ?Past Medical History:  ?Diagnosis Date  ? Atherosclerosis [id number]  ? aorta, iliacs and CFA bilaterally no greater than 0-49% - lower arterial doppler.  ? Blood transfusion without reported diagnosis 2005  ? Cancer [facility]) Ovarian 1978  ? Chest pain   ? COPD (chronic obstructive pulmonary disease) ([place])   ? COVID-19 long hauler manifesting chronic dyspnea   ? Emphysema of lung ([place]) 2019  ? Oxygen deficiency 03/2019  ? Oxygen dependent   ? 3 liters up to 5  ? ? ?Past Surgical History:  ?Procedure Laterality Date  ? ABDOMINAL HYSTERECTOMY  1978  ? CHOLECYSTECTOMY  2010  ? COLONOSCOPY    ? COLONOSCOPY    ? ESOPHAGOGASTRODUODENOSCOPY    ? NECK SURGERY  1999  ? OVARIAN CYST REMOVAL  1988  ? SPINE SURGERY  Neck 2002  ? ? ?Prior to Admission medications   ?Medication Sig Start Date End Date Taking? Authorizing Provider  ?albuterol (VENTOLIN HFA) 108 (90 Base) MCG/ACT inhaler TAKE 2 PUFFS BY MOUTH EVERY 6 HOURS AS NEEDED FOR WHEEZE OR SHORTNESS OF BREATH 09/28/21   Brand Males, MD  ?ASPIRIN LOW DOSE 81 MG EC tablet TAKE 1 TABLET BY MOUTH EVERY DAY 07/26/21   [name], MD  ?atorvastatin (LIPITOR) 10 MG tablet TAKE 1 TABLET BY MOUTH EVERY DAY 10/25/21   [name], MD  ?Azelastine HCl 137 MCG/SPRAY SOLN USE 1 SPRAY INTO EACH NOSTRIL TWICE A DAY 08/17/21   Brand Males, MD  ?Budeson-Glycopyrrol-Formoterol (BREZTRI AEROSPHERE) 160-9-4.8 MCG/ACT AERO Inhale 2 puffs into the lungs in the morning and at bedtime. 08/11/21   Parrett, Fonnie Mu, NP  ?FLUoxetine (PROZAC) 20 MG capsule TAKE 1 CAPSULE BY MOUTH EVERY DAY 10/25/21   [name], MD  ?gabapentin (NEURONTIN) 300 MG capsule Take 1  capsule (300 mg total) by mouth 3 (three) times daily. TAKE 1 CAPSULE BY MOUTH EVERY DAY FOR 7 DAYS THEN 1 TABLET BY MOUTH TWICE A DAY AND CONTINUE 07/28/21   Brand Males, MD  ?ipratropium-albuterol (DUONEB) 0.5-2.5 (3) MG/3ML SOLN USE 1 AMPULE IN NEBULIZER 4 TIMES A DAY AS NEEDED (DX: J67.341, J44.9) 07/26/21   Brand Males, MD  ?OXYGEN Inhale into the lungs. 3 liters at rest and 5 liters with any activity.-    [provider]  ?pantoprazole (PROTONIX) 40 MG tablet Take 1 tablet (40 mg total) by mouth daily. 07/28/21   Brand Males, MD  ?predniSONE (DELTASONE) 20 MG tablet Take 1 tablet (20 mg total) by mouth daily with breakfast. 10/25/21   [name], MD  ?Respiratory Therapy Supplies (FLUTTER) DEVI Use as directed 04/22/19   Martyn Ehrich, NP  ?Roflumilast (DALIRESP) 250 MCG TABS Take 1 tablet by mouth daily. 10/20/21   Brand Males, MD  ?roflumilast (DALIRESP) 500 MCG TABS tablet Take 1 tablet (500 mcg total) by mouth daily. ?Patient not taking: Reported on 10/25/2021 10/20/21   Brand Males, MD  ?Vibegron (GEMTESA) 75 MG TABS Take 75 mg by mouth daily. 07/26/21   Bjorn Loser, MD  ? ? ?Allergies as of 10/11/2021 - Review Complete 10/11/2021  ?Allergen Reaction Noted  ? Montelukast  12/17/2019  ? ? ?  Family History  ?Problem Relation Age of Onset  ? Heart failure Mother   ? Diabetes Mother   ? Kidney disease Mother   ? Hypertension Mother   ? Arthritis Mother   ? Heart disease Mother   ? Obesity Mother   ? COPD Father   ? Hearing loss Father   ? Lung cancer Brother   ? Asthma Son   ? Diabetes Son   ? Birth defects Son   ? COPD Brother   ? COPD Brother   ? ? ?Social History  ? ?Socioeconomic History  ? Marital status: Married  ?  Spouse name: Christia Reading ([place])  ? Number of children: 3  ? Years of education: Some college  ? Highest education level: Not on file  ?Occupational History  ? Not on file  ?Tobacco Use  ? Smoking status: Former  ?  Packs/day: 1.00  ?  Years: 45.00  ?   Pack years: 45.00  ?  Types: Cigarettes  ?  Quit date: 03/18/2016  ?  Years since quitting: 5.6  ? Smokeless tobacco: Never  ?Vaping Use  ? Vaping Use: Never used  ?Substance and Sexual Activity  ? Alcohol use: No  ?  Alcohol/week: 0.0 standard drinks  ? Drug use: No  ? Sexual activity: Not Currently  ?Other Topics Concern  ? Not on file  ?Social History Narrative  ? 08/27/19  ? From: the area (but [place] area)  ? Living: with husband [name] - since 43  ? 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   ? Family: Daughter Lynett Fish nearby, son Barbaraann Rondo in [place], and other daughter - 1 hour; 4 grandchildren  ?   ? Enjoys: crochet, relax at home  ?   ? Exercise: on hold but has been doing pulmonary rehab  ? Diet: not currently, but has lost weight  ?   ? Safety  ? Seat belts: Yes   ? Guns: Yes  and secure  ? Safe in relationships: Yes   ? ?Social Determinants of Health  ? ?Financial Resource Strain: Not on file  ?Food Insecurity: Not on file  ?Transportation Needs: Not on file  ?Physical Activity: Not on file  ?Stress: Not on file  ?Social Connections: Not on file  ?Intimate Partner Violence: Not on file  ? ? ?Review of Systems: ?See HPI, otherwise negative ROS ? ?Physical Exam: ?BP (!) 162/63   Pulse 61   Temp 97.8 ?F (36.6 ?C) (Temporal)   Resp (!) 21   Ht 5' 5.5" (1.664 m)   Wt 77.6 kg   SpO2 97%   BMI 28.02 kg/m?  ?General:   Alert,  pleasant and cooperative in NAD ?Head:  Normocephalic and atraumatic. ?Neck:  Supple; no masses or thyromegaly. ?Lungs:  Clear throughout to auscultation, normal respiratory effort.    ?Heart:  +S1, +S2, Regular rate and rhythm, No edema. ?Abdomen:  Soft, nontender and nondistended. Normal bowel sounds, without guarding, and without rebound.   ?Neurologic:  Alert and  oriented x4;  grossly normal neurologically. ? ?Impression/Plan: ?Maria [name] is here for an colonoscopy to be performed for Screening colonoscopy average risk   ?Risks, benefits, limitations, and  alternatives regarding  colonoscopy have been reviewed with the patient.  Questions have been answered.  All parties agreeable. ? ? ?Jonathon Bellows, MD  11/01/2021, 10:45 AM ? ?

## 2021-11-01 NOTE — Op Note (Signed)
[facility] ?Gastroenterology ?Patient Name: Maria [name] ?Procedure Date: 11/01/2021 10:53 AM ?MRN: [id number] ?Account #: [id number] ?Date of Birth: May 20, 1956 ?Admit Type: Outpatient ?Age: 66 ?Room: [facility] ENDO ROOM 4 ?Gender: Female ?Note Status: Finalized ?Instrument Name: Colonscope [id number] ?Procedure:             Colonoscopy ?Indications:           Surveillance: Personal history of colonic polyps  ?                       (unknown histology) on last colonoscopy more than 3  ?                       years ago ?Providers:             Jonathon Bellows MD, MD ?Referring MD:          Jobe Marker. Einar Pheasant (Referring MD) ?Medicines:             Monitored Anesthesia Care ?Complications:         No immediate complications. ?Procedure:             Pre-Anesthesia Assessment: ?                       - Prior to the procedure, a History and Physical was  ?                       performed, and patient medications, allergies and  ?                       sensitivities were reviewed. The patient's tolerance  ?                       of previous anesthesia was reviewed. ?                       - The risks and benefits of the procedure and the  ?                       sedation options and risks were discussed with the  ?                       patient. All questions were answered and informed  ?                       consent was obtained. ?                       - ASA Grade Assessment: II - A patient with mild  ?                       systemic disease. ?                       After obtaining informed consent, the colonoscope was  ?                       passed under direct vision. Throughout the procedure,  ?                       the patient's blood pressure,  pulse, and oxygen  ?                       saturations were monitored continuously. The  ?                       Colonoscope was introduced through the anus and  ?                       advanced to the the cecum, identified by the  ?                       appendiceal  orifice. The colonoscopy was performed  ?                       with ease. The patient tolerated the procedure well.  ?                       The quality of the bowel preparation was poor. ?Findings: ?     The perianal and digital rectal examinations were normal. ?     A 6 mm polyp was found in the ascending colon. The polyp was sessile.  ?     The polyp was removed with a cold snare. Resection and retrieval were  ?     complete. ?     A 15 mm polyp was found in the mid ascending colon. The polyp was  ?     sessile. Preparations were made for mucosal resection. Saline was  ?     injected to raise the lesion. Snare mucosal resection was performed.  ?     Resection and retrieval were complete. Coagulation for destruction of  ?     remaining portion of lesion using snare was successful. To prevent  ?     bleeding post-maneuver, two hemostatic clips were successfully placed.  ?     There was no bleeding during, or at the end, of the procedure. Borders  ?     of polyp marked using blue or nbi light ?     The exam was otherwise without abnormality. ?Impression:            - Preparation of the colon was poor. ?                       - One 6 mm polyp in the ascending colon, removed with  ?                       a cold snare. Resected and retrieved. ?                       - One 15 mm polyp in the mid ascending colon, removed  ?                       with mucosal resection. Resected and retrieved.  ?                       Treated with a hot snare. Clips were placed. ?                       - The examination was otherwise normal. ?                       -  Mucosal resection was performed. Resection and  ?                       retrieval were complete. ?Recommendation:        - Discharge patient to home (with escort). ?                       - Resume previous diet. ?                       - Continue present medications. ?                       - Await pathology results. ?                       - Repeat colonoscopy in 2 weeks  because the bowel  ?                       preparation was suboptimal. ?Procedure Code(s):     --- Professional --- ?                       [phone], Colonoscopy, flexible; with endoscopic mucosal  ?                       resection ?                       54270, 59, Colonoscopy, flexible; with removal of  ?                       tumor(s), polyp(s), or other lesion(s) by snare  ?                       technique ?Diagnosis Code(s):     --- Professional --- ?                       Z86.010, Personal history of colonic polyps ?                       K63.5, Polyp of colon ?CPT copyright 2019 American Medical Association. All rights reserved. ?The codes documented in this report are preliminary and upon coder review may  ?be revised to meet current compliance requirements. ?Jonathon Bellows, MD ?Jonathon Bellows MD, MD ?11/01/2021 11:22:43 AM ?This report has been signed electronically. ?Number of Addenda: 0 ?Note Initiated On: 11/01/2021 10:53 AM ?Scope Withdrawal Time: 0 hours 10 minutes 43 seconds  ?Total Procedure Duration: 0 hours 15 minutes 3 seconds  ?Estimated Blood Loss:  Estimated blood loss: none. ?     [facility] ?

## 2021-11-01 NOTE — Anesthesia Postprocedure Evaluation (Signed)
Anesthesia Post Note ? ?Patient: Maria [name] ? ?Procedure(s) Performed: COLONOSCOPY WITH PROPOFOL ? ?Patient location during evaluation: Endoscopy ?Anesthesia Type: General ?Level of consciousness: awake and alert ?Pain management: pain level controlled ?Vital Signs Assessment: post-procedure vital signs reviewed and stable ?Respiratory status: spontaneous breathing, nonlabored ventilation, respiratory function stable and patient connected to nasal cannula oxygen ?Cardiovascular status: blood pressure returned to baseline and stable ?Postop Assessment: no apparent nausea or vomiting ?Anesthetic complications: no ? ? ?No notable events documented. ? ? ?Last Vitals:  ?Vitals:  ? 11/01/21 1142 11/01/21 1152  ?BP: (!) 106/57 121/62  ?Pulse: 70 71  ?Resp: 18 18  ?Temp:    ?SpO2: 100% 100%  ?  ?Last Pain:  ?Vitals:  ? 11/01/21 1152  ?TempSrc:   ?PainSc: 0-No pain  ? ? ?  ?  ?  ?  ?  ?  ? ?[name] ? ? ? ? ?

## 2021-11-01 NOTE — Transfer of Care (Signed)
Immediate Anesthesia Transfer of Care Note ? ?Patient: Maria [name] ? ?Procedure(s) Performed: COLONOSCOPY WITH PROPOFOL ? ?Patient Location: Endoscopy Unit ? ?Anesthesia Type:General ? ?Level of Consciousness: drowsy ? ?Airway & Oxygen Therapy: Patient Spontanous Breathing ? ?Post-op Assessment: Report given to RN and Post -op Vital signs reviewed and stable ? ?Post vital signs: Reviewed and stable ? ?Last Vitals:  ?Vitals Value Taken Time  ?BP 123/109 11/01/21 1122  ?Temp    ?Pulse 94 11/01/21 1123  ?Resp 21 11/01/21 1123  ?SpO2 98 % 11/01/21 1123  ?Vitals shown include unvalidated device data. ? ?Last Pain:  ?Vitals:  ? 11/01/21 1040  ?TempSrc: Temporal  ?PainSc: 0-No pain  ?   ? ?  ? ?Complications: No notable events documented. ?

## 2021-11-01 NOTE — Anesthesia Preprocedure Evaluation (Signed)
Anesthesia Evaluation  ?Patient identified by MRN, date of birth, ID band ?Patient awake ? ? ? ?Reviewed: ?Allergy & Precautions, NPO status , Patient's Chart, lab work & pertinent test results ? ?History of Anesthesia Complications ?Negative for: history of anesthetic complications ? ?Airway ?Mallampati: III ? ?TM Distance: >3 FB ?Neck ROM: full ? ? ? Dental ? ?(+) Upper Dentures, Lower Dentures ?  ?Pulmonary ?COPD (3-5L [place]),  oxygen dependent, former smoker,  ?  ?Pulmonary exam normal ? ? ? ? ? ? ? Cardiovascular ?(-) anginanegative cardio ROS ?Normal cardiovascular exam ? ? ?  ?Neuro/Psych ?PSYCHIATRIC DISORDERS negative neurological ROS ?   ? GI/Hepatic ?negative GI ROS, Neg liver ROS, neg GERD  ,  ?Endo/Other  ?negative endocrine ROS ? Renal/GU ?negative Renal ROS  ?negative genitourinary ?  ?Musculoskeletal ? ? Abdominal ?  ?Peds ? Hematology ?negative hematology ROS ?(+)   ?Anesthesia Other Findings ?Past Medical History: ?1/0612014: Atherosclerosis ?    Comment:  aorta, iliacs and CFA bilaterally no greater than 0-49%  ?             - lower arterial doppler. ?2005: Blood transfusion without reported diagnosis ?Ovarian 1978: Cancer ([place]) ?No date: Chest pain ?No date: COPD (chronic obstructive pulmonary disease) ([place]) ?2019: Emphysema of lung ([place]) ?Sept 2020: Oxygen deficiency ? ?Past Surgical History: ?1978: ABDOMINAL HYSTERECTOMY ?2010: CHOLECYSTECTOMY ?1999: NECK SURGERY ?1988: OVARIAN CYST REMOVAL ?Neck 2002: [place] ? ? ? ? Reproductive/Obstetrics ?negative OB ROS ? ?  ? ? ? ? ? ? ? ? ? ? ? ? ? ?  ?  ? ? ? ? ? ? ? ? ?Anesthesia Physical ?Anesthesia Plan ? ?ASA: 3 ? ?Anesthesia Plan: General  ? ?Post-op Pain Management:   ? ?Induction: Intravenous ? ?PONV Risk Score and Plan: Propofol infusion and TIVA ? ?Airway Management Planned: Natural Airway and Nasal Cannula ? ?Additional Equipment:  ? ?Intra-op Plan:  ? ?Post-operative Plan:  ? ?Informed Consent: I have  reviewed the patients History and Physical, chart, labs and discussed the procedure including the risks, benefits and alternatives for the proposed anesthesia with the patient or authorized representative who has indicated his/her understanding and acceptance.  ? ? ? ?Dental Advisory Given ? ?Plan Discussed with: Anesthesiologist, CRNA and Surgeon ? ?Anesthesia Plan Comments: (Patient consented for risks of anesthesia including but not limited to:  ?- adverse reactions to medications ?- risk of airway placement if required ?- damage to eyes, teeth, lips or other oral mucosa ?- nerve damage due to positioning  ?- sore throat or hoarseness ?- Damage to heart, brain, nerves, lungs, other parts of body or loss of life ? ?Patient voiced understanding.)  ? ? ? ? ? ? ?Anesthesia Quick Evaluation ? ?

## 2021-11-02 ENCOUNTER — Encounter: Payer: Self-pay | Admitting: Gastroenterology

## 2021-11-02 LAB — SURGICAL PATHOLOGY

## 2021-11-04 ENCOUNTER — Encounter: Payer: Self-pay | Admitting: Gastroenterology

## 2021-11-10 ENCOUNTER — Other Ambulatory Visit: Payer: Self-pay | Admitting: Internal Medicine

## 2021-11-10 NOTE — Telephone Encounter (Signed)
MR, please advise on refill request. 

## 2021-11-12 ENCOUNTER — Other Ambulatory Visit: Payer: Self-pay | Admitting: Physician Assistant

## 2021-11-12 DIAGNOSIS — M810 Age-related osteoporosis without current pathological fracture: Secondary | ICD-10-CM

## 2021-11-17 ENCOUNTER — Other Ambulatory Visit: Payer: Self-pay | Admitting: Internal Medicine

## 2021-11-23 ENCOUNTER — Ambulatory Visit
Admission: RE | Admit: 2021-11-23 | Discharge: 2021-11-23 | Disposition: A | Discharge status: Home / Self Care | Payer: 59 | Source: Ambulatory Visit | Attending: Physician Assistant | Admitting: Physician Assistant

## 2021-11-23 ENCOUNTER — Ambulatory Visit
Admission: RE | Admit: 2021-11-23 | Discharge: 2021-11-23 | Disposition: A | Discharge status: Home / Self Care | Payer: 59 | Source: Ambulatory Visit | Attending: Family Medicine | Admitting: Family Medicine

## 2021-11-23 DIAGNOSIS — M810 Age-related osteoporosis without current pathological fracture: Secondary | ICD-10-CM | POA: Diagnosis present

## 2021-11-23 DIAGNOSIS — Z1231 Encounter for screening mammogram for malignant neoplasm of breast: Secondary | ICD-10-CM | POA: Diagnosis present

## 2021-11-25 ENCOUNTER — Inpatient Hospital Stay
Admission: RE | Admit: 2021-11-25 | Discharge: 2021-11-25 | Disposition: A | Discharge status: Home / Self Care | Payer: Self-pay | Source: Ambulatory Visit | Attending: *Deleted | Admitting: *Deleted

## 2021-11-25 ENCOUNTER — Other Ambulatory Visit: Payer: Self-pay | Admitting: *Deleted

## 2021-11-25 DIAGNOSIS — Z1231 Encounter for screening mammogram for malignant neoplasm of breast: Secondary | ICD-10-CM

## 2021-11-29 ENCOUNTER — Telehealth: Payer: Self-pay | Admitting: Internal Medicine

## 2021-11-29 NOTE — Telephone Encounter (Signed)
Fax received from Dr. Hulen Shouts with [facility] to perform a 2 to 3 anterior cervical fusion with implants and cages on patient.  Patient needs surgery clearance. Patient was seen on 10/20/2021. Office protocol is a risk assessment can be sent to surgeon if patient has been seen in 60 days or less.  ? ?Sending to [name] for risk assessment or recommendations if patient needs to be seen in office prior to surgical procedure.   ?

## 2021-12-02 NOTE — Telephone Encounter (Signed)
[name] please advise if patient is cleared for surgery. Patient was seen on 10/20/2021

## 2021-12-02 NOTE — Telephone Encounter (Signed)
Needs a spearate visit - can be video with APP to to preop clearance

## 2021-12-03 NOTE — Telephone Encounter (Signed)
I called and spoke with the patient made an OV with Rexene Edison NP for 12/15/21. She did not have any questions. Nothing further needed.

## 2021-12-15 ENCOUNTER — Ambulatory Visit (INDEPENDENT_AMBULATORY_CARE_PROVIDER_SITE_OTHER): Payer: 59 | Admitting: Adult Health

## 2021-12-15 ENCOUNTER — Encounter: Payer: Self-pay | Admitting: Adult Health

## 2021-12-15 ENCOUNTER — Ambulatory Visit (INDEPENDENT_AMBULATORY_CARE_PROVIDER_SITE_OTHER): Payer: 59

## 2021-12-15 VITALS — BP 138/68 | HR 78 | Temp 98.2°F | Ht 65.5 in | Wt 162.8 lb

## 2021-12-15 DIAGNOSIS — J449 Chronic obstructive pulmonary disease, unspecified: Secondary | ICD-10-CM | POA: Diagnosis not present

## 2021-12-15 DIAGNOSIS — R059 Cough, unspecified: Secondary | ICD-10-CM

## 2021-12-15 DIAGNOSIS — Z01811 Encounter for preprocedural respiratory examination: Secondary | ICD-10-CM

## 2021-12-15 DIAGNOSIS — J9611 Chronic respiratory failure with hypoxia: Secondary | ICD-10-CM

## 2021-12-15 DIAGNOSIS — Z0000 Encounter for general adult medical examination without abnormal findings: Secondary | ICD-10-CM | POA: Insufficient documentation

## 2021-12-15 NOTE — Assessment & Plan Note (Signed)
Continue on oxygen 3 L at rest and 5 L with activity

## 2021-12-15 NOTE — Assessment & Plan Note (Addendum)
Preop pulmonary risk assessment: Patient is at high risk from pulmonary standpoint with underlying Moderate COPD , Emphysema and chronic oxygen dependent respiratory failure.  Currently appears to be stable..  She is not excluded but at higher risk for postop complications. I went over in detail potential postop pulmonary complications with patient.  Advised that she is high risk for surgery.  1) RISK FOR PROLONGED MECHANICAL VENTILAION - > 48h  1A) Arozullah - Prolonged mech ventilation risk Arozullah Postperative Pulmonary Risk Score - for mech ventilation dependence >48h [employer], [place] 2000, major non-cardiac surgery) Comment Score  Type of surgery - abd ao aneurysm (27), thoracic (21), neurosurgery / upper abdominal / vascular (21), neck (11) Neck  11  Emergency Surgery - (11)  0  ALbumin < 3 or poor nutritional state - (9)  0  BUN > 30 -  (8)  0  Partial or completely dependent functional status - (7)  0  COPD -  (6)  6  Age - 60 to 69 (4), > 70  (6)  4  TOTAL    Risk Stratifcation scores  - < 10 (0.5%), 11-19 (1.8%), 20-27 (4.2%), 28-40 (10.1%), >40 (26.6%)  21          Major Pulmonary risks identified in the multifactorial risk analysis are but not limited to a) pneumonia; b) recurrent intubation risk; c) prolonged or recurrent acute respiratory failure needing mechanical ventilation; d) prolonged hospitalization; e) DVT/Pulmonary embolism; f) Acute Pulmonary edema  Recommend 1. Short duration of surgery as much as possible and avoid paralytic if possible 2. Recovery in step down or ICU with Pulmonary consultation if indicated  3. DVT prophylaxis if indicated  4. Aggressive pulmonary toilet with o2, bronchodilatation, and incentive spirometry and early ambulation

## 2021-12-15 NOTE — Patient Instructions (Addendum)
Continue on BREZTRI 2 puffs Twice daily  , rinse after use.  Albuterol inhaler as needed.  Activity as tolerated  Chest xray today .  Continue on Oxygen 3lm at rest and 5l/m with activity .  Pre Op risk assessment completed.  CT chest as planned  Follow up with [name] in  3-4 months  and As needed   Please contact office for sooner follow up if symptoms do not improve or worsen or seek emergency care

## 2021-12-15 NOTE — Progress Notes (Signed)
$'@Patient'y$  ID: Maria [name], female    DOB: [birth year], 67 y.o.   MRN: [id number]  Chief Complaint  Patient presents with   Follow-up    Referring provider: [name], MD  HPI: 66 year old female former smoker followed for moderate to severe COPD with emphysema, alpha-1 phenotype MZ with normal level, chronic cough post COVID-19 infection, chronic respiratory failure on home oxygen Critical illness around 2007 at [facility] complicated by PE considered provoked as on hormone replacement therapy, prolonged vent dependent respiratory failure requiring trach and eventual decannulation  TEST/EVENTS :  /1/21 PFTs - FEV1 1.75 (65%) p trelegy , DLCO 50% November 12, 2020 PFTs show FEV1 64%, ratio 54, FVC 91%, no significant bronchodilator response, DLCO 37%.   High-resolution CT chest August 30, 2019 negative for ILD chronic parenchymal band right lung base compatible with a nonspecific postinflammatory scarring.  Moderate to severe emphysema.   CT chest September 04, 2020 moderate emphysema.   Alpha 1 MZ, Level 103-125.   12/15/2021 Follow up : COPD with emphysema, oxygen dependent respiratory failure Patient presents for a 56-monthfollow-up and preop surgical risk assessment.  Patient has underlying moderate COPD with emphysema.  She is oxygen dependent she says overall she has been doing well with her breathing.  She still gets short of breath with minimal activities.  Has had no flare of her breathing recently.  No recent antibiotics or steroid use.  Patient was seen early last month.  She was set up for an ABG that showed no significant hypercarbia.  She has been set up for CT chest that is pending, routine as she has a heavy smoking history.  Also Daliresp was added into her maintenance regimen.  She remains on Breztri twice daily. Spirometry today in the office shows stable lung function with FEV1 at 66%, ratio 87, FVC 59%.  [place] [facility] [place], Dr. MSabra Heck. Has degenerative cervical spinal stenosis. Has chronic neck pain.  Has hard time dressing due to neck and left arm pain. Becoming hard to use left arm due to weakness.   Lives at home with spouse. Able to drive. Can do very light house chores and cooking . Can only walk short distances and has to rest. Gets winded easily .   Remains on Oxygen 3l/m at rest and 5l/m with activity .  She denies any increased oxygen demands.  Allergies  Allergen Reactions   Montelukast     Bad dreams    Immunization History  Administered Date(s) Administered   Influenza, Seasonal, Injecte, Preservative Fre 04/28/2008, 04/09/2010   Influenza,inj,Quad PF,6+ Mos 04/22/2019, 04/25/2020, 03/15/2021   Influenza-Unspecified 06/01/2008, 05/10/2016, 03/18/2017   Moderna Sars-Covid-2 Vaccination 07/30/2019, 08/28/2019, 06/27/2020   Pneumococcal Polysaccharide-23 04/15/2015, 04/22/2020   Td 10/01/2020   Tdap 11/17/2009   Zoster Recombinat (Shingrix) 04/16/2020, 06/22/2020    Past Medical History:  Diagnosis Date   Atherosclerosis 1/0612014   aorta, iliacs and CFA bilaterally no greater than 0-49% - lower arterial doppler.   Blood transfusion without reported diagnosis 2005   Cancer ([facility] Ovarian 1978   Chest pain    COPD (chronic obstructive pulmonary disease) ([place]    COVID-19 long hauler manifesting chronic dyspnea    [place]   Oxygen deficiency 03/2019   Oxygen dependent    3 liters up to 5    Tobacco History: Social History   Tobacco Use  Smoking Status Former   Packs/day: 1.00  Years: 45.00   Pack years: 45.00   Types: Cigarettes   Quit date: 03/18/2016   Years since quitting: 5.7  Smokeless Tobacco Never   Counseling given: Not Answered   Outpatient Medications Prior to Visit  Medication Sig Dispense Refill   albuterol (VENTOLIN HFA) 108 (90 Base) MCG/ACT inhaler TAKE 2 PUFFS BY MOUTH EVERY 6 HOURS AS NEEDED FOR WHEEZE OR  SHORTNESS OF BREATH 8.5 each 5   ASPIRIN LOW DOSE 81 MG EC tablet TAKE 1 TABLET BY MOUTH EVERY DAY 90 tablet 3   atorvastatin (LIPITOR) 10 MG tablet TAKE 1 TABLET BY MOUTH EVERY DAY 90 tablet 2   Azelastine HCl 137 MCG/SPRAY SOLN USE 1 SPRAY INTO EACH NOSTRIL TWICE A DAY 30 mL 1   Budeson-Glycopyrrol-Formoterol (BREZTRI AEROSPHERE) 160-9-4.8 MCG/ACT AERO Inhale 2 puffs into the lungs in the morning and at bedtime. 10.7 g 11   FLUoxetine (PROZAC) 20 MG capsule TAKE 1 CAPSULE BY MOUTH EVERY DAY 90 capsule 1   gabapentin (NEURONTIN) 300 MG capsule TAKE 1 CAPSULE BY MOUTH DAILY FOR 7 DAYS, THEN INCREASE TO TWICE DAILY 60 capsule 5   ipratropium-albuterol (DUONEB) 0.5-2.5 (3) MG/3ML SOLN USE 1 AMPULE IN NEBULIZER 4 TIMES A DAY AS NEEDED (DX: J84.112, J44.9) 360 mL 5   OXYGEN Inhale into the lungs. 3 liters at rest and 5 liters with any activity.-     pantoprazole (PROTONIX) 40 MG tablet Take 1 tablet (40 mg total) by mouth daily. 30 tablet 11   Respiratory Therapy Supplies (FLUTTER) DEVI Use as directed 1 each 0   roflumilast (DALIRESP) 500 MCG TABS tablet Take 1 tablet (500 mcg total) by mouth daily. 30 tablet 11   Vibegron (GEMTESA) 75 MG TABS Take 75 mg by mouth daily. 30 tablet 0   predniSONE (DELTASONE) 20 MG tablet Take 1 tablet (20 mg total) by mouth daily with breakfast. 5 tablet 0   Roflumilast (DALIRESP) 250 MCG TABS Take 1 tablet by mouth daily. 28 tablet 0   No facility-administered medications prior to visit.     Review of Systems:   Constitutional:   No  weight loss, night sweats,  Fevers, chills,  +fatigue, or  lassitude.  HEENT:   No headaches,  Difficulty swallowing,  Tooth/dental problems, or  Sore throat,                No sneezing, itching, ear ache, nasal congestion, post nasal drip,   CV:  No chest pain,  Orthopnea, PND, swelling in lower extremities, anasarca, dizziness, palpitations, syncope.   GI  No heartburn, indigestion, abdominal pain, nausea, vomiting, diarrhea,  change in bowel habits, loss of appetite, bloody stools.   Resp:   No chest wall deformity  Skin: no rash or lesions.  GU: no dysuria, change in color of urine, no urgency or frequency.  No flank pain, no hematuria   MS: Chronic neck pain   Physical Exam  BP 138/68 (BP Location: Left Arm, Cuff Size: Normal)   Pulse 78   Temp 98.2 F (36.8 C) (Temporal)   Ht 5' 5.5" (1.664 m)   Wt 162 lb 12.8 oz (73.8 kg)   SpO2 99%   BMI 26.68 kg/m   GEN: A/Ox3; pleasant , NAD, elderly, on oxygen   HEENT:  [place]/AT,   NOSE-clear, THROAT-clear, no lesions, no postnasal drip or exudate noted.  Class II MP airway  NECK:  Supple w/ fair ROM; no JVD; normal carotid impulses w/o bruits; no thyromegaly or nodules palpated; no  lymphadenopathy.    RESP  Clear  P & A; w/o, wheezes/ rales/ or rhonchi. no accessory muscle use, no dullness to percussion  CARD:  RRR, no m/r/g, no peripheral edema, pulses intact, no cyanosis or clubbing.  GI:   Soft & nt; nml bowel sounds; no organomegaly or masses detected.   Musco: Warm bil, no deformities or joint swelling noted.   Neuro: alert, no focal deficits noted.    Skin: Warm, no lesions or rashes    Lab Results:  CBC   BMET   BNP No results found for: BNP  ProBNP   Imaging:       Latest Ref Rng & Units 11/12/2020   10:43 AM 01/16/2020   10:57 AM 06/24/2019    8:45 AM  PFT Results  FVC-Pre L 2.94   3.17   2.93    FVC-Predicted Pre % 84   90   83    FVC-Post L 3.18   3.25   3.39    FVC-Predicted Post % 91   92   96    Pre FEV1/FVC % % 54   55   56    Post FEV1/FCV % % 54   56   59    FEV1-Pre L 1.60   1.75   1.64    FEV1-Predicted Pre % 59   65   60    FEV1-Post L 1.71   1.83   1.99    DLCO uncorrected ml/min/mmHg 8.06   10.81   12.24    DLCO UNC% % 37   50   57    DLCO corrected ml/min/mmHg 8.06   11.29     DLCO COR %Predicted % 37   52     DLVA Predicted % 57   61   60    TLC L 4.89   5.25   5.81    TLC % Predicted % 91   98    108    RV % Predicted % 88   86   110      No results found for: NITRICOXIDE      Assessment & Plan:   Stage 2 moderate COPD by GOLD classification (HCC) COPD appears stable.  Continue on current maintenance regimen along with Daliresp. Chest x-ray today.. Go for planned CT as scheduled  Plan  Patient Instructions  Continue on BREZTRI 2 puffs Twice daily  , rinse after use.  Albuterol inhaler as needed.  Activity as tolerated  Chest xray today .  Continue on Oxygen 3lm at rest and 5l/m with activity .  Pre Op risk assessment completed.  CT chest as planned  Follow up with [name] in  3-4 months  and As needed   Please contact office for sooner follow up if symptoms do not improve or worsen or seek emergency care       Chronic respiratory failure with hypoxia ([place]) Continue on oxygen 3 L at rest and 5 L with activity  Preop pulmonary/respiratory exam Preop pulmonary risk assessment: Patient is at high risk from pulmonary standpoint with underlying Moderate COPD , Emphysema and chronic oxygen dependent respiratory failure.  Currently appears to be stable..  She is not excluded but at higher risk for postop complications. I went over in detail potential postop pulmonary complications with patient.  Advised that she is high risk for surgery.  1) RISK FOR PROLONGED MECHANICAL VENTILAION - > 48h  1A) Arozullah - Prolonged mech ventilation risk Arozullah Postperative Pulmonary  Risk Score - for mech ventilation dependence >48h [employer], [place] 2000, major non-cardiac surgery) Comment Score  Type of surgery - abd ao aneurysm (27), thoracic (21), neurosurgery / upper abdominal / vascular (21), neck (11) Neck  11  Emergency Surgery - (11)  0  ALbumin < 3 or poor nutritional state - (9)  0  BUN > 30 -  (8)  0  Partial or completely dependent functional status - (7)  0  COPD -  (6)  6  Age - 60 to 29 (4), > 70  (6)  4  TOTAL    Risk Stratifcation scores  - < 10  (0.5%), 11-19 (1.8%), 20-27 (4.2%), 28-40 (10.1%), >40 (26.6%)  21          Major Pulmonary risks identified in the multifactorial risk analysis are but not limited to a) pneumonia; b) recurrent intubation risk; c) prolonged or recurrent acute respiratory failure needing mechanical ventilation; d) prolonged hospitalization; e) DVT/Pulmonary embolism; f) Acute Pulmonary edema  Recommend 1. Short duration of surgery as much as possible and avoid paralytic if possible 2. Recovery in step down or ICU with Pulmonary consultation if indicated  3. DVT prophylaxis if indicated  4. Aggressive pulmonary toilet with o2, bronchodilatation, and incentive spirometry and early ambulation         Rexene Edison, NP 12/15/2021

## 2021-12-15 NOTE — Assessment & Plan Note (Signed)
COPD appears stable.  Continue on current maintenance regimen along with Daliresp. Chest x-ray today.. Go for planned CT as scheduled  Plan  Patient Instructions  Continue on BREZTRI 2 puffs Twice daily  , rinse after use.  Albuterol inhaler as needed.  Activity as tolerated  Chest xray today .  Continue on Oxygen 3lm at rest and 5l/m with activity .  Pre Op risk assessment completed.  CT chest as planned  Follow up with [name] in  3-4 months  and As needed   Please contact office for sooner follow up if symptoms do not improve or worsen or seek emergency care

## 2021-12-16 ENCOUNTER — Encounter (INDEPENDENT_AMBULATORY_CARE_PROVIDER_SITE_OTHER): Payer: 59

## 2021-12-16 ENCOUNTER — Ambulatory Visit (INDEPENDENT_AMBULATORY_CARE_PROVIDER_SITE_OTHER): Payer: 59 | Admitting: Nurse Practitioner

## 2022-01-03 NOTE — Telephone Encounter (Signed)
OV notes and clearance form have been faxed back to [facility]. Nothing further needed at this time.

## 2022-01-19 ENCOUNTER — Other Ambulatory Visit: Payer: Self-pay | Admitting: Family Medicine

## 2022-01-19 DIAGNOSIS — F320 Major depressive disorder, single episode, mild: Secondary | ICD-10-CM

## 2022-01-21 ENCOUNTER — Other Ambulatory Visit (INDEPENDENT_AMBULATORY_CARE_PROVIDER_SITE_OTHER): Payer: Self-pay | Admitting: Vascular Surgery

## 2022-01-21 DIAGNOSIS — I6523 Occlusion and stenosis of bilateral carotid arteries: Secondary | ICD-10-CM

## 2022-01-24 ENCOUNTER — Ambulatory Visit (INDEPENDENT_AMBULATORY_CARE_PROVIDER_SITE_OTHER): Payer: 59

## 2022-01-24 ENCOUNTER — Encounter (INDEPENDENT_AMBULATORY_CARE_PROVIDER_SITE_OTHER): Payer: Self-pay | Admitting: Vascular Surgery

## 2022-01-24 ENCOUNTER — Ambulatory Visit (INDEPENDENT_AMBULATORY_CARE_PROVIDER_SITE_OTHER): Payer: 59 | Admitting: Vascular Surgery

## 2022-01-24 VITALS — BP 173/69 | HR 76 | Resp 16 | Wt 161.2 lb

## 2022-01-24 DIAGNOSIS — J449 Chronic obstructive pulmonary disease, unspecified: Secondary | ICD-10-CM | POA: Diagnosis not present

## 2022-01-24 DIAGNOSIS — I6523 Occlusion and stenosis of bilateral carotid arteries: Secondary | ICD-10-CM

## 2022-01-24 DIAGNOSIS — E782 Mixed hyperlipidemia: Secondary | ICD-10-CM

## 2022-01-24 DIAGNOSIS — I70213 Atherosclerosis of native arteries of extremities with intermittent claudication, bilateral legs: Secondary | ICD-10-CM

## 2022-01-28 ENCOUNTER — Encounter (INDEPENDENT_AMBULATORY_CARE_PROVIDER_SITE_OTHER): Payer: Self-pay | Admitting: Vascular Surgery

## 2022-01-28 DIAGNOSIS — I70219 Atherosclerosis of native arteries of extremities with intermittent claudication, unspecified extremity: Secondary | ICD-10-CM | POA: Insufficient documentation

## 2022-01-28 NOTE — Progress Notes (Signed)
MRN : [id number]  Maria [name] is a 66 y.o. (December 25, 1955) female who presents with chief complaint of check carotid arteries.  History of Present Illness:   The patient is seen for follow up evaluation of carotid stenosis. The carotid stenosis followed by ultrasound.   The patient denies amaurosis fugax. There is no recent history of TIA symptoms or focal motor deficits. There is no prior documented CVA.  The patient is taking enteric-coated aspirin 81 mg daily.  There is no history of migraine headaches. There is no history of seizures.  No recent shortening of the patient's walking distance or new symptoms consistent with claudication.  No history of rest pain symptoms. No new ulcers or wounds of the lower extremities have occurred.  There is no history of DVT, PE or superficial thrombophlebitis. No recent episodes of angina or shortness of breath documented.   Carotid Duplex done today shows RICA 8-85% and LICA 02-77%.  No change compared to last study in 05/2021   Current Meds  Medication Sig   albuterol (VENTOLIN HFA) 108 (90 Base) MCG/ACT inhaler TAKE 2 PUFFS BY MOUTH EVERY 6 HOURS AS NEEDED FOR WHEEZE OR SHORTNESS OF BREATH   ASPIRIN LOW DOSE 81 MG EC tablet TAKE 1 TABLET BY MOUTH EVERY DAY   atorvastatin (LIPITOR) 10 MG tablet TAKE 1 TABLET BY MOUTH EVERY DAY   Azelastine HCl 137 MCG/SPRAY SOLN USE 1 SPRAY INTO EACH NOSTRIL TWICE A DAY   Budeson-Glycopyrrol-Formoterol (BREZTRI AEROSPHERE) 160-9-4.8 MCG/ACT AERO Inhale 2 puffs into the lungs in the morning and at bedtime.   FLUoxetine (PROZAC) 20 MG capsule TAKE 1 CAPSULE BY MOUTH EVERY DAY   gabapentin (NEURONTIN) 300 MG capsule TAKE 1 CAPSULE BY MOUTH DAILY FOR 7 DAYS, THEN INCREASE TO TWICE DAILY   ipratropium-albuterol (DUONEB) 0.5-2.5 (3) MG/3ML SOLN USE 1 AMPULE IN NEBULIZER 4 TIMES A DAY AS NEEDED (DX: J84.112, J44.9)   OXYGEN Inhale into the lungs. 3 liters at rest and 5 liters with any activity.-   pantoprazole  (PROTONIX) 40 MG tablet Take 1 tablet (40 mg total) by mouth daily.   Respiratory Therapy Supplies (FLUTTER) DEVI Use as directed   roflumilast (DALIRESP) 500 MCG TABS tablet Take 1 tablet (500 mcg total) by mouth daily.   Vibegron (GEMTESA) 75 MG TABS Take 75 mg by mouth daily.    Past Medical History:  Diagnosis Date   Atherosclerosis 1/0612014   aorta, iliacs and CFA bilaterally no greater than 0-49% - lower arterial doppler.   Blood transfusion without reported diagnosis 2005   Cancer [facility]) Ovarian 1978   Chest pain    COPD (chronic obstructive pulmonary disease) ([place])    COVID-19 long hauler manifesting chronic dyspnea    Emphysema of lung ([place]) 2019   Oxygen deficiency 03/2019   Oxygen dependent    3 liters up to 5    Past Surgical History:  Procedure Laterality Date   [place]  2010   COLONOSCOPY     COLONOSCOPY     COLONOSCOPY WITH PROPOFOL N/A 11/01/2021   Procedure: COLONOSCOPY WITH PROPOFOL;  Surgeon: Jonathon Bellows, MD;  Location: [facility] ENDOSCOPY;  Service: Gastroenterology;  Laterality: N/A;   ESOPHAGOGASTRODUODENOSCOPY     NECK SURGERY  1999   OVARIAN CYST REMOVAL  1988   SPINE SURGERY  Neck 2002    Social History Social History   Tobacco Use   Smoking status: Former    Packs/day: 1.00  Years: 45.00    Total pack years: 45.00    Types: Cigarettes    Quit date: 03/18/2016    Years since quitting: 5.8   Smokeless tobacco: Never  Vaping Use   Vaping Use: Never used  Substance Use Topics   Alcohol use: No    Alcohol/week: 0.0 standard drinks of alcohol   Drug use: No    Family History Family History  Problem Relation Age of Onset   Heart failure Mother    Diabetes Mother    Kidney disease Mother    Hypertension Mother    Arthritis Mother    Heart disease Mother    Obesity Mother    COPD Father    Hearing loss Father    Breast cancer Maternal Aunt    Lung cancer Brother    COPD Brother    COPD Brother     Asthma Son    Diabetes Son    Birth defects Son     Allergies  Allergen Reactions   Montelukast     Bad dreams     REVIEW OF SYSTEMS (Negative unless checked)  Constitutional: '[]'$ Weight loss  '[]'$ Fever  '[]'$ Chills Cardiac: '[]'$ Chest pain   '[]'$ Chest pressure   '[]'$ Palpitations   '[]'$ Shortness of breath when laying flat   '[]'$ Shortness of breath with exertion. Vascular:  '[x]'$ Pain in legs with walking   '[]'$ Pain in legs at rest  '[]'$ History of DVT   '[]'$ Phlebitis   '[]'$ Swelling in legs   '[]'$ Varicose veins   '[]'$ Non-healing ulcers Pulmonary:   '[]'$ Uses home oxygen   '[]'$ Productive cough   '[]'$ Hemoptysis   '[]'$ Wheeze  '[]'$ COPD   '[]'$ Asthma Neurologic:  '[]'$ Dizziness   '[]'$ Seizures   '[]'$ History of stroke   '[]'$ History of TIA  '[]'$ Aphasia   '[]'$ Vissual changes   '[]'$ Weakness or numbness in arm   '[]'$ Weakness or numbness in leg Musculoskeletal:   '[]'$ Joint swelling   '[]'$ Joint pain   '[]'$ Low back pain Hematologic:  '[]'$ Easy bruising  '[]'$ Easy bleeding   '[]'$ Hypercoagulable state   '[]'$ Anemic Gastrointestinal:  '[]'$ Diarrhea   '[]'$ Vomiting  '[]'$ Gastroesophageal reflux/heartburn   '[]'$ Difficulty swallowing. Genitourinary:  '[]'$ Chronic kidney disease   '[]'$ Difficult urination  '[]'$ Frequent urination   '[]'$ Blood in urine Skin:  '[]'$ Rashes   '[]'$ Ulcers  Psychological:  '[]'$ History of anxiety   '[]'$  History of major depression.  Physical Examination  Vitals:   01/24/22 1042  BP: (!) 173/69  Pulse: 76  Resp: 16  Weight: 161 lb 3.2 oz (73.1 kg)   Body mass index is 26.42 kg/m. Gen: WD/WN, NAD Head: [place]/AT, No temporalis wasting.  Ear/Nose/Throat: Hearing grossly intact, nares w/o erythema or drainage Eyes: PER, EOMI, sclera nonicteric.  Neck: Supple, no masses.  No bruit or JVD.  Pulmonary:  Good air movement, no audible wheezing, no use of accessory muscles.  Cardiac: RRR, normal S1, S2, no Murmurs. Vascular:  carotid bruit noted Vessel Right Left  Radial Palpable but decreased Palpable  Carotid  Palpable  Palpable  Subclav  Palpable but Decreased Palpable   Gastrointestinal: soft, non-distended. No guarding/no peritoneal signs.  Musculoskeletal: M/S 5/5 throughout.  No visible deformity.  Neurologic: CN 2-12 intact. Pain and light touch intact in extremities.  Symmetrical.  Speech is fluent. Motor exam as listed above. Psychiatric: Judgment intact, Mood & affect appropriate for pt's clinical situation. Dermatologic: No rashes or ulcers noted.  No changes consistent with cellulitis.   CBC Lab Results  Component Value Date   WBC 15.0 (H) 10/20/2021   HGB 13.0 10/20/2021   HCT 39.4 10/20/2021  MCV 95.8 10/20/2021   PLT 347.0 10/20/2021    BMET    Component Value Date/Time   NA 140 10/15/2021 0838   K 4.4 10/15/2021 0838   CL 103 10/15/2021 0838   CO2 29 10/15/2021 0838   GLUCOSE 102 (H) 10/15/2021 0838   BUN 17 10/15/2021 0838   CREATININE 0.72 10/15/2021 0838   CALCIUM 9.7 10/15/2021 0838   GFRNONAA >60 03/03/2019 0154   GFRAA >60 03/03/2019 0154   CrCl cannot be calculated (Patient's most recent lab result is older than the maximum 21 days allowed.).  COAG Lab Results  Component Value Date   INR 1.0 10/03/2008   INR 1.0 10/02/2008    Radiology VAS US CAROTID  Result Date: 01/24/2022 Carotid Arterial Duplex Study Patient Name:  Maria [name]  Date of Exam:   01/24/2022 Medical Rec #: [id number]       Accession #:    [id number] Date of Birth: 06/27/1956        Patient Gender: F Patient Age:   6 years Exam Location:  [facility] Procedure:      VAS US CAROTID Referring Phys: Hortencia Pilar --------------------------------------------------------------------------------  Indications:   Carotid artery disease. Other Factors: History of right vertebral retrograde. Performing Technologist: Concha Norway RVT  Examination Guidelines: A complete evaluation includes B-mode imaging, spectral Doppler, color Doppler, and power Doppler as needed of all accessible portions of each vessel. Bilateral testing is considered an  integral part of a complete examination. Limited examinations for reoccurring indications may be performed as noted.  Right Carotid Findings: +----------+--------+--------+--------+----------------------+--------+           PSV cm/sEDV cm/sStenosisPlaque Description    Comments +----------+--------+--------+--------+----------------------+--------+ CCA Prox  86      13                                             +----------+--------+--------+--------+----------------------+--------+ CCA Mid   83      18                                             +----------+--------+--------+--------+----------------------+--------+ CCA Distal82      16                                             +----------+--------+--------+--------+----------------------+--------+ ICA Prox  94      19      1-39%   calcific and irregular         +----------+--------+--------+--------+----------------------+--------+ ICA Mid   89      21                                             +----------+--------+--------+--------+----------------------+--------+ ICA Distal70      20                                             +----------+--------+--------+--------+----------------------+--------+ ECA  64      5                                              +----------+--------+--------+--------+----------------------+--------+ +----------+--------+-------+--------+-------------------+           PSV cm/sEDV cmsDescribeArm Pressure (mmHG) +----------+--------+-------+--------+-------------------+ Subclavian110            Stenotic                    +----------+--------+-------+--------+-------------------+ +---------+--------+--+--------+----------+ VertebralPSV cm/s11EDV cm/sRetrograde +---------+--------+--+--------+----------+  Left Carotid Findings: +----------+--------+--------+--------+----------------------+--------+           PSV cm/sEDV cm/sStenosisPlaque Description     Comments +----------+--------+--------+--------+----------------------+--------+ CCA Prox  98      20                                             +----------+--------+--------+--------+----------------------+--------+ CCA Mid   97      19                                             +----------+--------+--------+--------+----------------------+--------+ CCA Distal86      21              calcific and irregularbulb     +----------+--------+--------+--------+----------------------+--------+ ICA Prox  237     47      60-79%  calcific and irregular         +----------+--------+--------+--------+----------------------+--------+ ICA Mid   151     38                                             +----------+--------+--------+--------+----------------------+--------+ ICA Distal151     38                                             +----------+--------+--------+--------+----------------------+--------+ ECA       192     18      >50%    calcific                       +----------+--------+--------+--------+----------------------+--------+ +----------+--------+--------+----------------+-------------------+           PSV cm/sEDV cm/sDescribe        Arm Pressure (mmHG) +----------+--------+--------+----------------+-------------------+ [id number]             Multiphasic, WNL                    +----------+--------+--------+----------------+-------------------+ +---------+--------+---+--------+--+---------+ VertebralPSV cm/s101EDV cm/s15Antegrade +---------+--------+---+--------+--+---------+   Summary: Right Carotid: Velocities in the right ICA are consistent with a 1-39% stenosis.                Non-hemodynamically significant plaque <50% noted in the CCA. The                ECA appears <50% stenosed. Left Carotid: Velocities in the left ICA are consistent with a 60-79% stenosis.  Hemodynamically significant plaque >50% visualized in the CCA. The                ECA appears >50% stenosed. ICA/CCA ratio = 2.44 ; Bulb shows               significant calcified irregular plaque. Vertebrals:  Left vertebral artery demonstrates antegrade flow. Right vertebral              artery demonstrates retrograde flow. Subclavians: Right subclavian artery was stenotic. Normal flow hemodynamics were              seen in the left subclavian artery. *See table(s) above for measurements and observations.  Electronically signed by Hortencia Pilar MD on 01/24/2022 at 5:23:04 PM.    Final      Assessment/Plan 1. Bilateral carotid artery stenosis Recommend:   Given the patient's asymptomatic subcritical stenosis no further invasive testing or surgery at this time.  I believe her symptoms are related to the DJD of the cervical and possibly LS spine.   Duplex ultrasound shows 1-39% RICA stenosis and 74-12% LICA stenosis.   Continue antiplatelet therapy as prescribed Continue management of CAD, HTN and Hyperlipidemia Healthy heart diet,  encouraged exercise at least 4 times per week Follow up in 6 months with duplex ultrasound and physical exam   - VAS US CAROTID - VAS US CAROTID; Future  2. Atherosclerosis of native artery of both lower extremities with intermittent claudication (HCC) Recommend:   The patient has evidence of atherosclerosis of the lower extremities with claudication.  The patient does not voice lifestyle limiting changes at this point in time.   Noninvasive studies do not suggest clinically significant change.   No invasive studies, angiography or surgery at this time The patient should continue walking and begin a more formal exercise program.  The patient should continue antiplatelet therapy and aggressive treatment of the lipid abnormalities   No changes in the patient's   3. Stage 2 moderate COPD by GOLD classification ([place]) Continue pulmonary medications and aerosols as already ordered, these medications have been reviewed and there are  no changes at this time.    4. Mixed hyperlipidemia Continue statin as ordered and reviewed, no changes at this time     Hortencia Pilar, MD  01/28/2022 3:42 PM

## 2022-02-04 ENCOUNTER — Ambulatory Visit (INDEPENDENT_AMBULATORY_CARE_PROVIDER_SITE_OTHER): Payer: 59 | Admitting: Family Medicine

## 2022-02-04 VITALS — BP 140/68 | HR 55 | Temp 97.6°F | Ht 65.5 in | Wt 162.5 lb

## 2022-02-04 DIAGNOSIS — Z9889 Other specified postprocedural states: Secondary | ICD-10-CM

## 2022-02-04 DIAGNOSIS — F320 Major depressive disorder, single episode, mild: Secondary | ICD-10-CM | POA: Diagnosis not present

## 2022-02-04 DIAGNOSIS — M792 Neuralgia and neuritis, unspecified: Secondary | ICD-10-CM

## 2022-02-04 MED ORDER — GABAPENTIN 300 MG PO CAPS: 300.0000 mg | ORAL_CAPSULE | Freq: Three times a day (TID) | ORAL | 1 refills | Status: DC

## 2022-02-04 NOTE — Assessment & Plan Note (Signed)
Continue gabapentin 300 mg 3 times daily. 

## 2022-02-04 NOTE — Progress Notes (Signed)
Subjective:     Maria [name] is a 66 y.o. female presenting for Referral (Pain management) and Medication Problem (Prozac not helping )     HPI  #Depression/Anxiety - has been on prozac 20 mg since 09/2021 - no improvement - does not recall what she was on in the past - does not want to do therapy  #neck pain - not a surgical candidate due to breathing - has had injections with the neurosurgeon w/o improvement - offered surgery  Review of Systems   Social History   Tobacco Use  Smoking Status Former   Packs/day: 1.00   Years: 45.00   Total pack years: 45.00   Types: Cigarettes   Quit date: 03/18/2016   Years since quitting: 5.8  Smokeless Tobacco Never        Objective:    BP Readings from Last 3 Encounters:  02/04/22 140/68  01/24/22 (!) 173/69  12/15/21 138/68   Wt Readings from Last 3 Encounters:  02/04/22 162 lb 8 oz (73.7 kg)  01/24/22 161 lb 3.2 oz (73.1 kg)  12/15/21 162 lb 12.8 oz (73.8 kg)    BP 140/68   Pulse (!) 55   Temp 97.6 F (36.4 C) (Temporal)   Ht 5' 5.5" (1.664 m)   Wt 162 lb 8 oz (73.7 kg)   SpO2 99%   BMI 26.63 kg/m    Physical Exam Constitutional:      General: She is not in acute distress.    Appearance: She is well-developed. She is not diaphoretic.  HENT:     Right Ear: External ear normal.     Left Ear: External ear normal.     Nose: Nose normal.  Eyes:     Conjunctiva/sclera: Conjunctivae normal.  Cardiovascular:     Rate and Rhythm: Normal rate.  Pulmonary:     Effort: Pulmonary effort is normal.     Comments: Breathing comfortably on oxygen Musculoskeletal:     Cervical back: Neck supple.  Skin:    General: Skin is warm and dry.     Capillary Refill: Capillary refill takes less than 2 seconds.  Neurological:     Mental Status: She is alert. Mental status is at baseline.  Psychiatric:        Mood and Affect: Mood normal.        Behavior: Behavior normal.           Assessment & Plan:    Problem List Items Addressed This Visit       Other   History of cervical spinal surgery - Primary    Continue gabapentin 300 mg 3 times daily.      Relevant Medications   gabapentin (NEURONTIN) 300 MG capsule   Other Relevant Orders   Ambulatory referral to Pain Clinic   Ambulatory referral to Occupational Therapy   Radicular pain in right arm    She has seen neurosurgery, was offered surgery, unfortunately pulmonology and neurosurgery both think she is extremely high risk for respiratory distress following surgery.  She is severely limited by the radicular pain and weakness she cannot dress herself or do her own hair.  Referral to pain medicine to see if they can offer her any additional treatment.  Discussed referral to Occupational Therapy and pending that evaluation if they think physical therapy would be beneficial can add that on as well.  Depending on the pain medicine referral may consider PMR in the future.      Relevant Medications  gabapentin (NEURONTIN) 300 MG capsule   Other Relevant Orders   Ambulatory referral to Pain Clinic   Ambulatory referral to Occupational Therapy   Current mild episode of major depressive disorder without prior episode (HCC)    No improvement on prozac 20 mg. Discussed increasing to 40 mg. Declined therapy. If ineffective, will switch to cymbalta to see if this will help her radicular pain.         Return in about 6 weeks (around 03/18/2022) for anxiety/depression .  [name], MD

## 2022-02-04 NOTE — Assessment & Plan Note (Signed)
She has seen neurosurgery, was offered surgery, unfortunately pulmonology and neurosurgery both think she is extremely high risk for respiratory distress following surgery.  She is severely limited by the radicular pain and weakness she cannot dress herself or do her own hair.  Referral to pain medicine to see if they can offer her any additional treatment.  Discussed referral to Occupational Therapy and pending that evaluation if they think physical therapy would be beneficial can add that on as well.  Depending on the pain medicine referral may consider PMR in the future.

## 2022-02-04 NOTE — Patient Instructions (Addendum)
Depression - increase Prozac to 40 mg (2 pills daily) - update if no improvement in 2-3 if no improvement - will plan to switch to Cymbalta if no

## 2022-02-04 NOTE — Assessment & Plan Note (Signed)
No improvement on prozac 20 mg. Discussed increasing to 40 mg. Declined therapy. If ineffective, will switch to cymbalta to see if this will help her radicular pain.

## 2022-02-10 ENCOUNTER — Encounter (INDEPENDENT_AMBULATORY_CARE_PROVIDER_SITE_OTHER): Payer: Self-pay | Admitting: *Deleted

## 2022-02-15 ENCOUNTER — Ambulatory Visit (INDEPENDENT_AMBULATORY_CARE_PROVIDER_SITE_OTHER)
Admission: RE | Admit: 2022-02-15 | Discharge: 2022-02-15 | Disposition: A | Discharge status: Home / Self Care | Payer: 59 | Source: Ambulatory Visit | Attending: Family Medicine | Admitting: Family Medicine

## 2022-02-15 ENCOUNTER — Ambulatory Visit (INDEPENDENT_AMBULATORY_CARE_PROVIDER_SITE_OTHER): Payer: Medicare HMO | Admitting: Family Medicine

## 2022-02-15 VITALS — BP 100/50 | HR 50 | Temp 97.2°F | Wt 153.2 lb

## 2022-02-15 DIAGNOSIS — J441 Chronic obstructive pulmonary disease with (acute) exacerbation: Secondary | ICD-10-CM

## 2022-02-15 DIAGNOSIS — J9611 Chronic respiratory failure with hypoxia: Secondary | ICD-10-CM | POA: Diagnosis not present

## 2022-02-15 DIAGNOSIS — R059 Cough, unspecified: Secondary | ICD-10-CM | POA: Diagnosis not present

## 2022-02-15 MED ORDER — AZITHROMYCIN 250 MG PO TABS: ORAL_TABLET | ORAL | 0 refills | Status: DC

## 2022-02-15 MED ORDER — PREDNISONE 20 MG PO TABS: 20.0000 mg | ORAL_TABLET | Freq: Every day | ORAL | 0 refills | Status: AC

## 2022-02-15 NOTE — Assessment & Plan Note (Signed)
Secondary to COPD, slight increase needing oxygen from her baseline.  Still satting well on 5 L today.  ER precautions discussed.  Treat for COPD exacerbation.

## 2022-02-15 NOTE — Patient Instructions (Signed)
Drink fluids Try to eat Antibiotics and steroids

## 2022-02-15 NOTE — Assessment & Plan Note (Signed)
Patient with chronic hypoxia on 3 to 5 L of oxygen.  Today she notes, productive cough shortness of breath, some increased oxygen need.  The testing was negative.  The x-ray without clear consolidation, we will follow-up final read.  Treat with steroids and azithromycin, she responded well to this in April.  ER precautions including needing to increase oxygen to 6 L or needing to have it remain at 5 L at a regular basis.  Pressure is slightly low today also discussed if she is feeling lightheaded dizzy or faint she should go to the emergency department.

## 2022-02-15 NOTE — Progress Notes (Signed)
Subjective:     Maria [name] is a 66 y.o. female presenting for Cough (Productive, with some yellow mucous. Worse at night )     Cough This is a new problem. Episode onset: 02/12/2022. The problem has been unchanged. The cough is Productive of sputum. Associated symptoms include headaches, nasal congestion, rhinorrhea, shortness of breath, weight loss and wheezing. Pertinent negatives include no chills, ear congestion, ear pain or fever. Exacerbated by: talking. She has tried OTC cough suppressant (vapor cool drops) for the symptoms.   No covid test Brother sick with same thing - going to the doctor tomorrow  Using inhaler w/o help    Review of Systems  Constitutional:  Positive for weight loss. Negative for chills and fever.  HENT:  Positive for rhinorrhea. Negative for ear pain.   Respiratory:  Positive for cough, shortness of breath and wheezing.   Neurological:  Positive for headaches.     Social History   Tobacco Use  Smoking Status Former   Packs/day: 1.00   Years: 45.00   Total pack years: 45.00   Types: Cigarettes   Quit date: 03/18/2016   Years since quitting: 5.9  Smokeless Tobacco Never        Objective:    BP Readings from Last 3 Encounters:  02/15/22 (!) 100/50  02/04/22 140/68  01/24/22 (!) 173/69   Wt Readings from Last 3 Encounters:  02/15/22 153 lb 4 oz (69.5 kg)  02/04/22 162 lb 8 oz (73.7 kg)  01/24/22 161 lb 3.2 oz (73.1 kg)    BP (!) 100/50   Pulse (!) 50   Temp (!) 97.2 F (36.2 C) (Temporal)   Wt 153 lb 4 oz (69.5 kg)   SpO2 98%   BMI 25.11 kg/m    Physical Exam Constitutional:      General: She is not in acute distress.    Appearance: She is well-developed. She is not diaphoretic.  HENT:     Right Ear: External ear normal.     Left Ear: External ear normal.     Nose: Nose normal.  Eyes:     Conjunctiva/sclera: Conjunctivae normal.  Cardiovascular:     Rate and Rhythm: Normal rate and regular rhythm.     Heart sounds:  Heart sounds are distant.  Pulmonary:     Effort: Pulmonary effort is normal. Tachypnea present.     Breath sounds: Decreased breath sounds and wheezing present.  Musculoskeletal:     Cervical back: Neck supple.  Skin:    General: Skin is warm and dry.     Capillary Refill: Capillary refill takes less than 2 seconds.  Neurological:     Mental Status: She is alert. Mental status is at baseline.  Psychiatric:        Mood and Affect: Mood normal.        Behavior: Behavior normal.     CXR (my read): No focal consolidations. Bronchial thickening which appears stable.       Assessment & Plan:   Problem List Items Addressed This Visit       Respiratory   Chronic respiratory failure with hypoxia ([place])    Secondary to COPD, slight increase needing oxygen from her baseline.  Still satting well on 5 L today.  ER precautions discussed.  Treat for COPD exacerbation.      COPD exacerbation ([place]) - Primary    Patient with chronic hypoxia on 3 to 5 L of oxygen.  Today she notes, productive cough shortness  of breath, some increased oxygen need.  The testing was negative.  The x-ray without clear consolidation, we will follow-up final read.  Treat with steroids and azithromycin, she responded well to this in April.  ER precautions including needing to increase oxygen to 6 L or needing to have it remain at 5 L at a regular basis.  Pressure is slightly low today also discussed if she is feeling lightheaded dizzy or faint she should go to the emergency department.      Relevant Medications   azithromycin (ZITHROMAX) 250 MG tablet   predniSONE (DELTASONE) 20 MG tablet   Other Relevant Orders   DG Chest 2 View     Return if symptoms worsen or fail to improve.  [name], MD

## 2022-02-18 ENCOUNTER — Encounter: Payer: Self-pay | Admitting: Family Medicine

## 2022-02-18 DIAGNOSIS — F32A Depression, unspecified: Secondary | ICD-10-CM

## 2022-02-21 ENCOUNTER — Ambulatory Visit: Payer: Medicare HMO

## 2022-02-21 ENCOUNTER — Encounter: Payer: Self-pay | Admitting: Internal Medicine

## 2022-02-21 DIAGNOSIS — J9611 Chronic respiratory failure with hypoxia: Secondary | ICD-10-CM | POA: Diagnosis not present

## 2022-02-21 DIAGNOSIS — J449 Chronic obstructive pulmonary disease, unspecified: Secondary | ICD-10-CM | POA: Diagnosis not present

## 2022-02-21 MED ORDER — DULOXETINE HCL 30 MG PO CPEP: 30.0000 mg | ORAL_CAPSULE | Freq: Every day | ORAL | 3 refills | Status: DC

## 2022-02-22 ENCOUNTER — Telehealth (INDEPENDENT_AMBULATORY_CARE_PROVIDER_SITE_OTHER): Payer: Medicare HMO | Admitting: Internal Medicine

## 2022-02-22 DIAGNOSIS — J9621 Acute and chronic respiratory failure with hypoxia: Secondary | ICD-10-CM

## 2022-02-22 DIAGNOSIS — J441 Chronic obstructive pulmonary disease with (acute) exacerbation: Secondary | ICD-10-CM | POA: Diagnosis not present

## 2022-02-22 MED ORDER — AMOXICILLIN-POT CLAVULANATE 875-125 MG PO TABS: 1.0000 | ORAL_TABLET | Freq: Two times a day (BID) | ORAL | 0 refills | Status: DC

## 2022-02-22 MED ORDER — ACETAMINOPHEN-CODEINE 300-30 MG PO TABS: 1.0000 | ORAL_TABLET | ORAL | 0 refills | Status: AC | PRN

## 2022-02-22 MED ORDER — PREDNISONE 10 MG PO TABS: ORAL_TABLET | ORAL | 0 refills | Status: DC

## 2022-02-22 NOTE — Patient Instructions (Addendum)
Protonix '40mg'$  Take 30- 60 min before your first and last meals of the day until cough is gone  Prednisone 10 mg Take 4 for three days 3 for three days 2 for three days 1 for three days and stop   Mucinex dm 1200 mg every 12 hours and use the flutter valve as much as possible supplement with tylenol #3 one every 4 hours as needed   Augmentin 875 mg take one pill twice daily  X 10 days - take at breakfast and supper with large glass of water.  It would help reduce the usual side effects (diarrhea and yeast infections) if you ate cultured yogurt at lunch.   Duoneb is up 4 hours  and if can't get relief you will need to go to ER

## 2022-02-22 NOTE — Progress Notes (Signed)
EMERLYN MEHLHOFF, female    DOB: [birth year]   MRN: [id number]   Brief patient profile:  28 yowf  quit smoking 2017 self- referred for virtual ov  02/22/2022 for aecopd  Wears 02 3lpm and 5 lpm walking uses scooter at grocery store x  spring 2023    History of Present Illness  Virtual Visit via Epic/ MyChart  02/22/2022   I connected with [name] on 02/22/22 at 10:15 AM EDT by My Chart/Epic connection virtually  and verified that I am speaking with the correct person using two identifiers. Pt is at home and this call made from my office with no other participants    I discussed the limitations, risks, security and privacy concerns of performing an evaluation and management service virtually  and the availability of in person appointments. I also discussed with the patient that there may be a patient responsible charge related to this service. The patient expressed understanding and agreed to proceed.  Was seen 02/15/22 by PCP with aecopd rx zpak and pred > no better  Dyspnea:  room to room but comfortable at rest / no cp  Cough: yellowish worse than usual x 02/12/22 very congested sounding / assoc nasal congestion  SABA use: nebulizer not helping   baseline using 3 x daily  /  5-6 x daily since onset of flare 02 turned up to 4lpm POC sats low 90s   Past Medical History:  Diagnosis Date   Atherosclerosis 1/0612014   aorta, iliacs and CFA bilaterally no greater than 0-49% - lower arterial doppler.   Blood transfusion without reported diagnosis 2005   Cancer [facility]) Ovarian 1978   Chest pain    COPD (chronic obstructive pulmonary disease) ([place])    COVID-19 long hauler manifesting chronic dyspnea    Emphysema of lung ([place]) 2019   Oxygen deficiency 03/2019   Oxygen dependent    3 liters up to 5    Outpatient Medications Prior to Visit  Medication Sig Dispense Refill   albuterol (VENTOLIN HFA) 108 (90 Base) MCG/ACT inhaler TAKE 2 PUFFS BY MOUTH EVERY 6 HOURS AS NEEDED FOR WHEEZE OR  SHORTNESS OF BREATH 8.5 each 5   ASPIRIN LOW DOSE 81 MG EC tablet TAKE 1 TABLET BY MOUTH EVERY DAY 90 tablet 3   atorvastatin (LIPITOR) 10 MG tablet TAKE 1 TABLET BY MOUTH EVERY DAY 90 tablet 2   Azelastine HCl 137 MCG/SPRAY SOLN USE 1 SPRAY INTO EACH NOSTRIL TWICE A DAY 30 mL 1   azithromycin (ZITHROMAX) 250 MG tablet Take 2 tablets (500 mg) today. Then take 1 tablet daily for the next 4 days. 6 tablet 0   Budeson-Glycopyrrol-Formoterol (BREZTRI AEROSPHERE) 160-9-4.8 MCG/ACT AERO Inhale 2 puffs into the lungs in the morning and at bedtime. 10.7 g 11   DULoxetine (CYMBALTA) 30 MG capsule Take 1 capsule (30 mg total) by mouth daily. 30 capsule 3   gabapentin (NEURONTIN) 300 MG capsule Take 1 capsule (300 mg total) by mouth 3 (three) times daily. 270 capsule 1   ipratropium-albuterol (DUONEB) 0.5-2.5 (3) MG/3ML SOLN USE 1 AMPULE IN NEBULIZER 4 TIMES A DAY AS NEEDED (DX: J84.112, J44.9) 360 mL 5   OXYGEN Inhale into the lungs. 3 liters at rest and 5 liters with any activity.-     pantoprazole (PROTONIX) 40 MG tablet Take 1 tablet (40 mg total) by mouth daily. 30 tablet 11   Respiratory Therapy Supplies (FLUTTER) DEVI Use as directed 1 each 0   roflumilast (DALIRESP)  500 MCG TABS tablet Take 1 tablet (500 mcg total) by mouth daily. 30 tablet 11   Vibegron (GEMTESA) 75 MG TABS Take 75 mg by mouth daily. 30 tablet 0   No facility-administered medications prior to visit.     Objective:     Appears chronically ill with mild resting increased wob/ conversational dyspnea  and rattling cough     Imp AEcopd / likely viral uri but could have sinusitis as well  Acute on chronic hypoxemic resp failure  Rec: Protonix '40mg'$  Take 30- 60 min before your first and last meals of the day until cough is gone  Prednisone 10 mg Take 4 for three days 3 for three days 2 for three days 1 for three days and stop   Mucinex dm 1200 mg every 12 hours and use the flutter valve as much as possible supplement with  tylenol #3 one every 4 hours as needed   Augmentin 875 mg take one pill twice daily  X 10 days - take at breakfast and supper with large glass of water.  It would help reduce the usual side effects (diarrhea and yeast infections) if you ate cultured yogurt at lunch.   Duoneb is up 4 hours  and if can't get relief you will need to go to ER       Face time was 10 min, same day charting > 30 min for pt new to me.    Christinia Gully, MD 02/22/2022

## 2022-02-23 ENCOUNTER — Ambulatory Visit: Payer: Medicare HMO | Attending: Family Medicine | Admitting: Occupational Therapy

## 2022-02-23 DIAGNOSIS — R053 Chronic cough: Secondary | ICD-10-CM | POA: Insufficient documentation

## 2022-02-23 DIAGNOSIS — U071 COVID-19: Secondary | ICD-10-CM | POA: Diagnosis not present

## 2022-02-23 DIAGNOSIS — R278 Other lack of coordination: Secondary | ICD-10-CM | POA: Insufficient documentation

## 2022-02-23 DIAGNOSIS — M6281 Muscle weakness (generalized): Secondary | ICD-10-CM | POA: Insufficient documentation

## 2022-02-23 DIAGNOSIS — M792 Neuralgia and neuritis, unspecified: Secondary | ICD-10-CM | POA: Insufficient documentation

## 2022-02-23 NOTE — Therapy (Addendum)
OUTPATIENT OCCUPATIONAL THERAPY NEURO EVALUATION  Patient Name: Maria [name] MRN: [id number] DOB:1956-01-01, 66 y.o., female Today's Date: 02/23/2022  PCP: Waunita Schooner NP REFERRING PROVIDER: Waunita Schooner NP   OT End of Session - 02/23/22 1116     Visit Number 1    Number of Visits Date of Re-evaluation 10th visit progress report 12 05/18/2022  Starting 02/23/2022   OT Start Time 1115    OT Stop Time 1215    OT Time Calculation (min) 60 min    Activity Tolerance Patient tolerated treatment well    Behavior During Therapy Restless;Anxious             Past Medical History:  Diagnosis Date   Atherosclerosis 1/0612014   aorta, iliacs and CFA bilaterally no greater than 0-49% - lower arterial doppler.   Blood transfusion without reported diagnosis 2005   Cancer [facility]) Ovarian 1978   Chest pain    COPD (chronic obstructive pulmonary disease) ([place])    COVID-19 long hauler manifesting chronic dyspnea    Emphysema of lung ([place]) 2019   Oxygen deficiency 03/2019   Oxygen dependent    3 liters up to 5   Past Surgical History:  Procedure Laterality Date   [place]  2010   COLONOSCOPY     COLONOSCOPY     COLONOSCOPY WITH PROPOFOL N/A 11/01/2021   Procedure: COLONOSCOPY WITH PROPOFOL;  Surgeon: Jonathon Bellows, MD;  Location: [facility] ENDOSCOPY;  Service: Gastroenterology;  Laterality: N/A;   ESOPHAGOGASTRODUODENOSCOPY     [place] SURGERY  Neck 2002   Patient Active Problem List   Diagnosis Date Noted   Atherosclerosis of native arteries of extremity with intermittent claudication ([place]) 01/28/2022   Preop pulmonary/respiratory exam 12/15/2021   COPD exacerbation ([place]) 10/25/2021   Current mild episode of major depressive disorder without prior episode ([place]) 10/01/2021   Acute pain of right shoulder 08/09/2021   History of cervical spinal surgery 08/09/2021   Radicular pain in right arm  08/09/2021   Numbness and tingling in both hands 06/17/2021   Carotid stenosis 06/15/2021   Hyperlipidemia 06/15/2021   Right foot pain 05/11/2021   Stress incontinence 03/15/2021   Urinary frequency 03/15/2021   Pulsatile tinnitus of left ear 03/15/2021   Chronic cough 12/11/2020   Prediabetes 09/30/2019   Tear of left gluteus minimus tendon 09/30/2019   Aortic atherosclerosis ([place]) 09/30/2019   Emphysema due to alpha-1-antitrypsin deficiency ([place]) 09/16/2019   Cheilitis 08/27/2019   Other microscopic hematuria 08/27/2019   Bronchiectasis without complication ([place]) 23/30/0762   Chronic respiratory failure with hypoxia ([place]) 04/09/2019   Pneumonia due to COVID-19 virus 02/27/2019   COVID-19 virus infection 02/26/2019   Stage 2 moderate COPD by GOLD classification ([place]) 02/26/2019   Syncope 02/26/2019   Orthostatic hypotension 02/26/2019   Left buttock pain 04/12/2018   Anxiety 01/30/2017   Chronic constipation 11/04/2016   Chronic bronchitis ([place]) 07/14/2015   Smoking history 04/15/2015   Benign neoplasm of parathyroid gland 06/04/2012   Bunion 06/04/2012   Enthesopathy of ankle and tarsus 06/04/2012   Gout 08/10/2010   History of DVT (deep vein thrombosis) 05/21/2010   Depressive disorder 02/11/2008    ONSET DATE: 08/18/2021  REFERRING DIAG:  History of cervical spinal surgery; Radicular pain in R arm  THERAPY DIAG:  Cervical spinal surgery; Radicular pain in R arm  Rationale for Evaluation and Treatment Rehabilitation  SUBJECTIVE:  SUBJECTIVE STATEMENT: Maria [name]. Reports that she would really like to be able to wash her hair Maria [name] accompanied by: self  PERTINENT HISTORY: Maria [name]. Is a 67 y.o. female who presents with a history of  chronic cough post COVID-19. Per Maria [name]. Chart, Maria [name]. Had  a critical  illness in [id number] complicated by a PE as Maria [name]. on HRT, and prolonged vent dependent Respiratory failure. Maria [name]. Has a history of severe limitations from RUE /neck radicular pain which began in  08/2021. Maria [name]. Reports having seen an orthopedic surgeon in [place], [place] through the [facility]. Maria [name]. reports that it was determined that the Maria [name]. Is not a candidate for surgery due to her respiratory status. Maria [name]. [facility], and has her first appointment in September. PMHx includes: Remote history of neck surgery, CAD, HTN,  Hyperlipidemia.   PRECAUTIONS: None  WEIGHT BEARING RESTRICTIONS No  PAIN:  Are you having pain? Yes: NPRS scale: 7.5/10 Pain location: Right side of the neck to the shoulder Pain description: Sharp shooting, constant Aggravating factors: moving her right arm Relieving factors: keeping the right arm at her side, with the elbow flexed  FALLS: Has patient fallen in last 6 months? No  LIVING ENVIRONMENT: Lives with: lives with their spouse Lives in: House/apartment Stairs: Yes: Ramp Has following equipment at home: Lobbyist, Wheelchair (power), shower chair, and Ramped entry  PLOF: Independent with basic ADLs  PATIENT GOALS  Learning how to shower, wash hair, and getting dressed  OBJECTIVE:   HAND DOMINANCE: Right  ADLs: Overall ADLs:  Maria [name].'s family assists with ADLS. Transfers/ambulation related to ADLs: Eating: Independent with the left hand. Husband assists with cutting food. Grooming:  Left hand hand for brushing teeth. MaxA  haircare UB Dressing: MaxA, Assist with buttoning LB Dressing: MaxA, Assist with fasteners Toileting: Uses her left hand, MaxA fasteners Bathing: MaxA Tub Shower transfers:  Equipment: Shower seat   IADLs: Shopping: Accompanied by her husband, uses an Contractor. Light housekeeping: Light dusting from a seated position. FAmily assists  Meal Prep:  Husband prepares meals Community mobility: Transport Chair, Portable O2 Medication management: Maria [name]. And husband sets up weekly pillbox together Financial management: Independent, on the computer. Handwriting:   Writing is limited  MOBILITY STATUS: Needs Assist:    POSTURE COMMENTS:    Sitting balance:  good supported  ACTIVITY TOLERANCE Activity tolerance: Maria [name]. Is able to tolerates than 10 min. Of activity before requiring rest breaks.  FUNCTIONAL OUTCOME MEASURES: FOTO: 12  UPPER EXTREMITY ROM      ROM Right Eval 10/10 pain with shoulder ROM Left WFL  Shoulder flexion 20(20)   Shoulder abduction 62(62)   Shoulder adduction    Shoulder extension    Shoulder internal rotation    Shoulder external rotation    Elbow flexion 78   Elbow extension N/A   Wrist flexion    Wrist extension 38(46)   Wrist ulnar deviation    Wrist radial deviation    Wrist pronation    Wrist supination    (Blank rows = not tested)   UPPER EXTREMITY MMT:     MMT Right eval Left Eval  Shoulder flexion 2-/5 4/5  Shoulder abduction 2/5 4/5  Shoulder adduction    Shoulder extension    Shoulder internal rotation    Shoulder external rotation    Middle trapezius    Lower trapezius    Elbow flexion 2/5 4/5  Elbow extension  4/5  Wrist flexion  Wrist extension 3-/5 4/5  Wrist ulnar deviation    Wrist radial deviation    Wrist pronation    Wrist supination    (Blank rows = not tested)  HAND FUNCTION: Grip strength: Right: 8#, Left: 39# Lateral Pinch: Right: 3#, Left 10# 3pt. Pinch: Right: 1#, Left: 7#   COORDINATION: Maria [name]. Was able to perform thumb opposition to the tip of the 5th digit.  SENSATION: Light touch: Impaired  Proprioception: Impaired   EDEMA: N/A  MUSCLE TONE:   COGNITION: Overall cognitive status: Within functional limits for tasks assessed  VISION: To be assessed        PERCEPTION: Not tested  PRAXIS: Not tested  OBSERVATIONS: Maria [name]. Maintains her RUE adducted to her side, elbow slightly flexed with her hand in her lap.  Maria [name]. Has home O2 portable tank with an extended battery.   TODAY'S TREATMENT:   Maria [name]. Participated in the initial evaluation.    PATIENT  EDUCATION: Education details:  OT services, POC, Goals Person educated: Patient Education method: Explanation Education comprehension: verbalized understanding   HOME EXERCISE PROGRAM:                Continue ongoing assessment of HEP needs.    GOALS: Goals reviewed with patient? Yes  SHORT TERM GOALS: Target date: 04/06/2022    Maria [name]. Will improve FOTO score by 2 points to reflect improved ADL performance Baseline: Eval: FOTO score: 12 Goal status: INITIAL  LONG TERM GOALS: Target date: 05/18/2022  Maria [name]. Will independently demonstrate one armed adaptive techniques for LE ADLs. Baseline: Eval: MaxA.  Goal status: INITIAL  2.  Maria [name] will independently demonstrate  one armed techniques for UE ADLs Baseline:  Eval: MaxA Goal status: INITIAL  3.  Maria [name]. Will independently demonstrate one armed techniques for performing hair care. Baseline: Eval: MaxA Goal status: INITIAL  4.  Maria [name]. Will button a shirt with modified independence Baseline: Eval: MaxA Goal status: INITIAL  5.  Maria [name]. Will improve grip strength in her right hand by 3#  to be able to use her right hand to stabilize items while using then left hand during ADLs. Baseline: Eval: Right grip strength 8#, Left: 39# Goal status: INITIAL  6.  Maria [name]. Will independently demonstrate self-ROM to the Right shoulder in preparation for repositioning the UE during ADLs, and IADL tasks. Baseline: Right shoulder flexion: 20(20), abduction: 62(62) Goal status: INITIAL  ASSESSMENT:  CLINICAL IMPRESSION:  Patient is a 66 y.o. female who was seen today for occupational therapy evaluation for assessment of UE functioning and ADL/IADL performance. Maria [name]. presents with 10/10 radicular pain proximally in the RUE limiting ROM, weakness, decreased activity tolerance, and poor endurance during ADLs, and IADL tasks. Maria [name]. Is currently on 4LO2, using a portable O2 pack with an extended battery. Per Maria [name]. Chart, Maria [name]. to increase O2 to 5L for activity.  Maria [name].'s SpO2:  98% with HR 79-80 bpms.  Maria [name].'s FOTO score is 12 with the TR score 38. Maria [name]. Will benefit from OT services for ADL training, A/E training, UE there. Ex, neuromuscular re-education, and Maria [name]. Education about energy conservation/work simplification techniques during ADLs, and IADLs  as well as one armed ADL/IADL compensatory strategies.   PERFORMANCE DEFICITS in functional skills including ADLs, IADLs, coordination, dexterity, sensation, ROM, strength, pain, FMC, GMC, mobility, and endurance, cognitive skills including emotional, and psychosocial skills including coping strategies, environmental adaptation, and routines and behaviors.   IMPAIRMENTS are limiting patient from ADLs, IADLs, education, work, leisure, and social participation.   COMORBIDITIES may have co-morbidities  that affects occupational performance. Patient will benefit from skilled OT to address above impairments and improve overall function.  MODIFICATION OR ASSISTANCE TO COMPLETE EVALUATION: Maximum or significant modification of tasks or assist is necessary to complete an evaluation.  OT OCCUPATIONAL PROFILE AND HISTORY: Comprehensive assessment: Review of records and extensive additional review of physical, cognitive, psychosocial history related to current functional performance.  CLINICAL DECISION MAKING: High - multiple treatment options, significant modification of task necessary  REHAB POTENTIAL: Good  EVALUATION COMPLEXITY: High    PLAN: OT FREQUENCY: 1x/week  OT DURATION: 12 weeks  PLANNED INTERVENTIONS: self care/ADL training, therapeutic exercise, therapeutic activity, neuromuscular re-education, manual therapy, passive range of motion, and splinting, Maria [name]. Education was energy conservation/work simplification  RECOMMENDED OTHER SERVICES:   CONSULTED AND AGREED WITH PLAN OF CARE: Patient  PLAN FOR NEXT SESSION:  Initiate treatment session per the POC.    Harrel Carina, MS, OTR/L  Harrel Carina,  OT 02/23/2022, 11:22 AM

## 2022-02-24 ENCOUNTER — Other Ambulatory Visit: Payer: Self-pay | Admitting: Family Medicine

## 2022-02-24 DIAGNOSIS — F320 Major depressive disorder, single episode, mild: Secondary | ICD-10-CM

## 2022-02-27 ENCOUNTER — Encounter: Payer: Self-pay | Admitting: Internal Medicine

## 2022-02-27 DIAGNOSIS — J9621 Acute and chronic respiratory failure with hypoxia: Secondary | ICD-10-CM | POA: Insufficient documentation

## 2022-02-27 NOTE — Assessment & Plan Note (Signed)
See ov  02/22/2022

## 2022-02-27 NOTE — Assessment & Plan Note (Signed)
See ov 02/22/2022

## 2022-02-28 ENCOUNTER — Ambulatory Visit: Payer: Medicare HMO

## 2022-02-28 DIAGNOSIS — M792 Neuralgia and neuritis, unspecified: Secondary | ICD-10-CM | POA: Diagnosis not present

## 2022-02-28 DIAGNOSIS — U071 COVID-19: Secondary | ICD-10-CM | POA: Diagnosis not present

## 2022-02-28 DIAGNOSIS — M6281 Muscle weakness (generalized): Secondary | ICD-10-CM | POA: Diagnosis not present

## 2022-02-28 DIAGNOSIS — R278 Other lack of coordination: Secondary | ICD-10-CM | POA: Diagnosis not present

## 2022-02-28 DIAGNOSIS — R053 Chronic cough: Secondary | ICD-10-CM | POA: Diagnosis not present

## 2022-02-28 NOTE — Therapy (Signed)
OUTPATIENT OCCUPATIONAL THERAPY NEURO TREATMENT  Patient Name: Maria [name] MRN: [id number] DOB:January 05, 1956, 66 y.o., female Today's Date: 02/28/2022  PCP: Waunita Schooner NP REFERRING PROVIDER: Waunita Schooner NP    OT End of Session - 02/28/22 0927     Visit Number 2    Number of Visits 12    Date for OT Re-Evaluation 05/18/22    OT Start Time 0831    OT Stop Time 0920    OT Time Calculation (min) 49 min    Activity Tolerance Patient tolerated treatment well    Behavior During Therapy [facility] for tasks assessed/performed             Past Medical History:  Diagnosis Date   Atherosclerosis 1/0612014   aorta, iliacs and CFA bilaterally no greater than 0-49% - lower arterial doppler.   Blood transfusion without reported diagnosis 2005   Cancer [facility]) Ovarian 1978   Chest pain    COPD (chronic obstructive pulmonary disease) ([place])    COVID-19 long hauler manifesting chronic dyspnea    Emphysema of lung ([place]) 2019   Oxygen deficiency 03/2019   Oxygen dependent    3 liters up to 5   Past Surgical History:  Procedure Laterality Date   [place]  2010   COLONOSCOPY     COLONOSCOPY     COLONOSCOPY WITH PROPOFOL N/A 11/01/2021   Procedure: COLONOSCOPY WITH PROPOFOL;  Surgeon: Jonathon Bellows, MD;  Location: [facility] ENDOSCOPY;  Service: Gastroenterology;  Laterality: N/A;   ESOPHAGOGASTRODUODENOSCOPY     [place] SURGERY  Neck 2002   Patient Active Problem List   Diagnosis Date Noted   Acute on chronic respiratory failure with hypoxemia ([place]) 02/27/2022   Atherosclerosis of native arteries of extremity with intermittent claudication ([place]) 01/28/2022   Preop pulmonary/respiratory exam 12/15/2021   COPD exacerbation ([place]) 10/25/2021   Current mild episode of major depressive disorder without prior episode ([place]) 10/01/2021   Acute pain of right shoulder 08/09/2021   History of cervical spinal surgery  08/09/2021   Radicular pain in right arm 08/09/2021   Numbness and tingling in both hands 06/17/2021   Carotid stenosis 06/15/2021   Hyperlipidemia 06/15/2021   Right foot pain 05/11/2021   Stress incontinence 03/15/2021   Urinary frequency 03/15/2021   Pulsatile tinnitus of left ear 03/15/2021   Chronic cough 12/11/2020   Prediabetes 09/30/2019   Tear of left gluteus minimus tendon 09/30/2019   Aortic atherosclerosis ([place]) 09/30/2019   Emphysema due to alpha-1-antitrypsin deficiency ([place]) 09/16/2019   Cheilitis 08/27/2019   Other microscopic hematuria 08/27/2019   Bronchiectasis without complication ([place]) 76/54/6503   Chronic respiratory failure with hypoxia ([place]) 04/09/2019   Pneumonia due to COVID-19 virus 02/27/2019   COVID-19 virus infection 02/26/2019   Stage 2 moderate COPD by GOLD classification ([place]) 02/26/2019   Syncope 02/26/2019   Orthostatic hypotension 02/26/2019   Left buttock pain 04/12/2018   Anxiety 01/30/2017   Chronic constipation 11/04/2016   Chronic bronchitis ([place]) 07/14/2015   Smoking history 04/15/2015   Benign neoplasm of parathyroid gland 06/04/2012   Bunion 06/04/2012   Enthesopathy of ankle and tarsus 06/04/2012   Gout 08/10/2010   History of DVT (deep vein thrombosis) 05/21/2010   Depressive disorder 02/11/2008    ONSET DATE: 08/18/2021  REFERRING DIAG: History of cervical spinal surgery; Radicular pain in R arm  THERAPY DIAG:  Radicular pain in right arm  COVID  Rationale for Evaluation and Treatment Rehabilitation  SUBJECTIVE:   SUBJECTIVE STATEMENT: Pt reports that getting herself dressed can take up to an hour.   PERTINENT HISTORY: pt. Is a 66 y.o. female who presents with a history of  chronic cough post COVID-19. Per Pt. Chart, Pt. Had  a critical  illness in 7867 complicated by a PE as pt. on HRT, and prolonged vent dependent Respiratory failure. Pt. Has a history of severe limitations from RUE /neck radicular pain which began in  08/2021. Pt. Reports having seen an orthopedic surgeon in [place], [place] through the [facility]. Pt. reports that it was determined that the Pt. Is not a candidate for surgery due to her respiratory status. Pt. Has been [facility], and has her first appointment in September. PMHx includes: Remote history of neck surgery, CAD, HTN,  Hyperlipidemia.   PAIN:  Are you having pain? Yes: NPRS scale: 7.5/10 Pain location: Right side of the neck to the shoulder Pain description: Sharp shooting, constant Aggravating factors: moving her right arm Relieving factors: keeping the right arm at her side, with the elbow flexed  PLOF: Independent with basic ADLs  PATIENT GOALS  Learning how to shower, wash hair, and getting dressed  OBJECTIVE:  ACTIVITY TOLERANCE Activity tolerance: Pt. Is able to tolerate than 10 min. Of activity before requiring rest breaks.  FUNCTIONAL OUTCOME MEASURES: FOTO: 12  UPPER EXTREMITY ROM      ROM Right Eval 10/10 pain with shoulder ROM Left WFL  Shoulder flexion 20(20)   Shoulder abduction 62(62)   Shoulder adduction    Shoulder extension    Shoulder internal rotation    Shoulder external rotation    Elbow flexion 78   Elbow extension N/A   Wrist flexion    Wrist extension 38(46)   Wrist ulnar deviation    Wrist radial deviation    Wrist pronation    Wrist supination    (Blank rows = not tested)   UPPER EXTREMITY MMT:     MMT Right eval Left Eval  Shoulder flexion 2-/5 4/5  Shoulder abduction 2/5 4/5  Shoulder adduction    Shoulder extension    Shoulder internal rotation    Shoulder external rotation    Middle trapezius    Lower trapezius    Elbow flexion 2/5 4/5  Elbow extension  4/5  Wrist flexion    Wrist extension 3-/5 4/5  Wrist ulnar deviation    Wrist radial deviation    Wrist pronation    Wrist supination    (Blank rows = not tested)  HAND FUNCTION: Grip strength: Right: 8#, Left:  39# Lateral Pinch: Right: 3#, Left 10# 3pt. Pinch: Right: 1#, Left: 7#   OBSERVATIONS: Pt. Maintains her RUE adducted to her side, elbow slightly flexed with her hand in her lap.  Pt. Has home O2 portable tank with an extended battery.   TODAY'S TREATMENT:   Self Care: Educated pt on benefits of long handled sponge for hard to reach areas when bathing/simulated with one from clinic.  Pt found it helpful to reach back, parts of LUE, lower legs and feet.  Educated on benefits of adjustable shower hose mount to direct water at head for shampoo and rinse, increasing accessibility for hose to enable reach from her shower chair, while conserving energy by limiting sit<>stand and extended reach for shower hose.  Educated pt on benefits of hip kit and individual tools within kit.  Instructed pt in use of  reacher/sockaid/long handled shoe horn for donning/doffing socks/shoes.  Made recommendation for reacher with hook, sockaid, long handled shoe horn, long handled sponge, and dressing stick as items to look for in a kit.  OT advised on options to obtain and pt receptive to all.    Therapeutic Exercise: OT issued soft, light blue theraputty and instructed in gross grasping and pinching exercises for R hand strengthening.  Encouraged use 5-10 min at a time, 1-2x per day.  Pt verbalized understanding with good return demo of exercises.    PATIENT EDUCATION: Education details:  Dressing/bathing strategies/AE/theraputty exercises Person educated: Patient Education method: Explanation, demonstration Education comprehension: verbalized understanding   HOME EXERCISE PROGRAM:  Theraputty for R hand grip and pinch strengthening   GOALS: Goals reviewed with patient? Yes  SHORT TERM GOALS: Target date: 04/06/2022    Pt. Will improve FOTO score by 2 points to reflect improved ADL performance Baseline: Eval: FOTO score: 12 Goal status: INITIAL  LONG TERM GOALS: Target date: 05/18/2022  Pt. Will  independently demonstrate one armed adaptive techniques for LE ADLs. Baseline: Eval: MaxA.  Goal status: INITIAL  2.  Pt will independently demonstrate  one armed techniques for UE ADLs Baseline:  Eval: MaxA Goal status: INITIAL  3.  Pt. Will independently demonstrate one armed techniques for performing hair care. Baseline: Eval: MaxA Goal status: INITIAL  4.  Pt. Will button a shirt with modified independence Baseline: Eval: MaxA Goal status: INITIAL  5.  Pt. Will improve grip strength in her right hand by 3#  to be able to use her right hand to stabilize items while using then left hand during ADLs. Baseline: Eval: Right grip strength 8#, Left: 39# Goal status: INITIAL  6.  Pt. Will independently demonstrate self-ROM to the Right shoulder in preparation for repositioning the UE during ADLs, and IADL tasks. Baseline: Right shoulder flexion: 20(20), abduction: 62(62) Goal status: INITIAL  ASSESSMENT:  CLINICAL IMPRESSION: Pt receptive to all AE reviewed today and plans to obtain hip kit and adjustable shower hose mounts.  OT encouraged pt to bring in items of clothing which she struggles with so that pt can practice with strategies and AE in the clinic and problem solve through adaptive methods for donning/doffing with OT guidance.  Pt was able to don/doff socks and shoes today with long handled devices and min vc.  Pt will benefit from continued OT services for ADL training, A/E training, UE there. Ex, neuromuscular re-education, and pt education about energy conservation/work simplification techniques during ADLs, and IADLs as well as one armed ADL/IADL compensatory strategies.   PERFORMANCE DEFICITS in functional skills including ADLs, IADLs, coordination, dexterity, sensation, ROM, strength, pain, FMC, GMC, mobility, and endurance, cognitive skills including emotional, and psychosocial skills including coping strategies, environmental adaptation, and routines and behaviors.    IMPAIRMENTS are limiting patient from ADLs, IADLs, education, work, leisure, and social participation.   COMORBIDITIES may have co-morbidities  that affects occupational performance. Patient will benefit from skilled OT to address above impairments and improve overall function.  MODIFICATION OR ASSISTANCE TO COMPLETE EVALUATION: Maximum or significant modification of tasks or assist is necessary to complete an evaluation.  OT OCCUPATIONAL PROFILE AND HISTORY: Comprehensive assessment: Review of records and extensive additional review of physical, cognitive, psychosocial history related to current functional performance.  CLINICAL DECISION MAKING: High - multiple treatment options, significant modification of task necessary  REHAB POTENTIAL: Good  EVALUATION COMPLEXITY: High    PLAN: OT FREQUENCY: 1x/week  OT DURATION: 12 weeks  PLANNED INTERVENTIONS: self care/ADL training, therapeutic exercise, therapeutic activity, neuromuscular re-education, manual therapy, passive range of motion, and splinting, pt. Education was energy conservation/work simplification  RECOMMENDED OTHER SERVICES:   CONSULTED AND AGREED WITH PLAN OF CARE: Patient  PLAN FOR NEXT SESSION:  AE training/ADL strategies.  Leta Speller, MS, OTR/L   Darleene Cleaver, OT 02/28/2022, 11:23 AM

## 2022-03-07 ENCOUNTER — Ambulatory Visit: Payer: Medicare HMO

## 2022-03-07 DIAGNOSIS — R278 Other lack of coordination: Secondary | ICD-10-CM | POA: Diagnosis not present

## 2022-03-07 DIAGNOSIS — U071 COVID-19: Secondary | ICD-10-CM

## 2022-03-07 DIAGNOSIS — M792 Neuralgia and neuritis, unspecified: Secondary | ICD-10-CM | POA: Diagnosis not present

## 2022-03-07 DIAGNOSIS — M6281 Muscle weakness (generalized): Secondary | ICD-10-CM | POA: Diagnosis not present

## 2022-03-07 DIAGNOSIS — R053 Chronic cough: Secondary | ICD-10-CM | POA: Diagnosis not present

## 2022-03-07 NOTE — Therapy (Signed)
OUTPATIENT OCCUPATIONAL THERAPY NEURO TREATMENT  Patient Name: Maria [name] MRN: [id number] DOB:Dec 12, 1955, 66 y.o., female Today's Date: 03/07/2022  PCP: Waunita Schooner NP REFERRING PROVIDER: Waunita Schooner NP    OT End of Session - 03/07/22 0933     Visit Number 3    Number of Visits 12    Date for OT Re-Evaluation 05/18/22    OT Start Time 0838    OT Stop Time 0919    OT Time Calculation (min) 41 min    Activity Tolerance Patient tolerated treatment well    Behavior During Therapy [facility] for tasks assessed/performed             Past Medical History:  Diagnosis Date   Atherosclerosis 1/0612014   aorta, iliacs and CFA bilaterally no greater than 0-49% - lower arterial doppler.   Blood transfusion without reported diagnosis 2005   Cancer [facility]) Ovarian 1978   Chest pain    COPD (chronic obstructive pulmonary disease) ([place])    COVID-19 long hauler manifesting chronic dyspnea    Emphysema of lung ([place]) 2019   Oxygen deficiency 03/2019   Oxygen dependent    3 liters up to 5   Past Surgical History:  Procedure Laterality Date   [place]  2010   COLONOSCOPY     COLONOSCOPY     COLONOSCOPY WITH PROPOFOL N/A 11/01/2021   Procedure: COLONOSCOPY WITH PROPOFOL;  Surgeon: Jonathon Bellows, MD;  Location: [facility] ENDOSCOPY;  Service: Gastroenterology;  Laterality: N/A;   ESOPHAGOGASTRODUODENOSCOPY     [place] SURGERY  Neck 2002   Patient Active Problem List   Diagnosis Date Noted   Acute on chronic respiratory failure with hypoxemia ([place]) 02/27/2022   Atherosclerosis of native arteries of extremity with intermittent claudication ([place]) 01/28/2022   Preop pulmonary/respiratory exam 12/15/2021   COPD exacerbation ([place]) 10/25/2021   Current mild episode of major depressive disorder without prior episode ([place]) 10/01/2021   Acute pain of right shoulder 08/09/2021   History of cervical spinal surgery  08/09/2021   Radicular pain in right arm 08/09/2021   Numbness and tingling in both hands 06/17/2021   Carotid stenosis 06/15/2021   Hyperlipidemia 06/15/2021   Right foot pain 05/11/2021   Stress incontinence 03/15/2021   Urinary frequency 03/15/2021   Pulsatile tinnitus of left ear 03/15/2021   Chronic cough 12/11/2020   Prediabetes 09/30/2019   Tear of left gluteus minimus tendon 09/30/2019   Aortic atherosclerosis ([place]) 09/30/2019   Emphysema due to alpha-1-antitrypsin deficiency ([place]) 09/16/2019   Cheilitis 08/27/2019   Other microscopic hematuria 08/27/2019   Bronchiectasis without complication ([place]) 06/02/5207   Chronic respiratory failure with hypoxia ([place]) 04/09/2019   Pneumonia due to COVID-19 virus 02/27/2019   COVID-19 virus infection 02/26/2019   Stage 2 moderate COPD by GOLD classification ([place]) 02/26/2019   Syncope 02/26/2019   Orthostatic hypotension 02/26/2019   Left buttock pain 04/12/2018   Anxiety 01/30/2017   Chronic constipation 11/04/2016   Chronic bronchitis ([place]) 07/14/2015   Smoking history 04/15/2015   Benign neoplasm of parathyroid gland 06/04/2012   Bunion 06/04/2012   Enthesopathy of ankle and tarsus 06/04/2012   Gout 08/10/2010   History of DVT (deep vein thrombosis) 05/21/2010   Depressive disorder 02/11/2008    ONSET DATE: 08/18/2021  REFERRING DIAG: History of cervical spinal surgery; Radicular pain in R arm  THERAPY DIAG:  COVID  Radicular pain in right arm  Muscle weakness (generalized)  Rationale for Evaluation and Treatment Rehabilitation  SUBJECTIVE:   SUBJECTIVE STATEMENT: Pt reports that she ordered a hip kit and has been using the long handled devices and feels like they have been very helpful with her bathing/dressing.  Pt states she even got her brother to order a kit and taught him how to use the devices to help him as well.  PERTINENT HISTORY: pt. Is a 66 y.o. female who presents with a history of  chronic cough post  COVID-19. Per Pt. Chart, Pt. Had  a critical  illness in 7078 complicated by a PE as pt. on HRT, and prolonged vent dependent Respiratory failure. Pt. Has a history of severe limitations from RUE /neck radicular pain which began in 08/2021. Pt. Reports having seen an orthopedic surgeon in [place], [place] through the [facility]. Pt. reports that it was determined that the Pt. Is not a candidate for surgery due to her respiratory status. Pt. Has been referred to the [facility], and has her first appointment in September. PMHx includes: Remote history of neck surgery, CAD, HTN,  Hyperlipidemia.   PAIN:  Are you having pain? Yes: NPRS scale: 7.5/10 Pain location: Right side of the neck to the shoulder Pain description: Sharp shooting, constant Aggravating factors: moving her right arm Relieving factors: keeping the right arm at her side, with the elbow flexed  PLOF: Independent with basic ADLs  PATIENT GOALS  Learning how to shower, wash hair, and getting dressed  OBJECTIVE:  ACTIVITY TOLERANCE Activity tolerance: Pt. Is able to tolerate than 10 min. Of activity before requiring rest breaks.  FUNCTIONAL OUTCOME MEASURES: FOTO: 12  UPPER EXTREMITY ROM      ROM Right Eval 10/10 pain with shoulder ROM Left WFL  Shoulder flexion 20(20)   Shoulder abduction 62(62)   Shoulder adduction    Shoulder extension    Shoulder internal rotation    Shoulder external rotation    Elbow flexion 78   Elbow extension N/A   Wrist flexion    Wrist extension 38(46)   Wrist ulnar deviation    Wrist radial deviation    Wrist pronation    Wrist supination    (Blank rows = not tested)   UPPER EXTREMITY MMT:     MMT Right eval Left Eval  Shoulder flexion 2-/5 4/5  Shoulder abduction 2/5 4/5  Shoulder adduction    Shoulder extension    Shoulder internal rotation    Shoulder external rotation    Middle trapezius    Lower trapezius    Elbow flexion 2/5 4/5  Elbow  extension  4/5  Wrist flexion    Wrist extension 3-/5 4/5  Wrist ulnar deviation    Wrist radial deviation    Wrist pronation    Wrist supination    (Blank rows = not tested)  HAND FUNCTION: Grip strength: Right: 8#, Left: 39# Lateral Pinch: Right: 3#, Left 10# 3pt. Pinch: Right: 1#, Left: 7#   OBSERVATIONS: Pt. Maintains her RUE adducted to her side, elbow slightly flexed with her hand in her lap.  Pt. Has home O2 portable tank with an extended battery.   TODAY'S TREATMENT:   Self Care: Practiced tying hair up in a towel to simulate towel drying post shower, with and without use of reacher.  Pt was able to use R hand as a stabilizer while the LUE managed the towel overhead, and was able to complete without reacher while maintaining good positioning of the RUE  in lap.  Provided additional education for EC strategies with bathing/dressing.  Encouraged pt use bath robe and lay towels out on bed to skip towel drying after shower and allow for a rest period before dressing by laying in robe until dry.  Pt plans to implement this this week, as she states that this would also reduce the physical burden on spouse as he currently helps to dry pt after her shower.  Reinforced making sure to keep water temp luke warm, crack the bathroom door and shower door to keep air flowing.  Pt reports she does this as she no longer tolerates a hot shower.      Therapeutic Exercise: Instructed in table slides for R shoulder flex, horiz abd/add, and abd, with pt requiring mod tactile cues for positioning and vc to ensure pt completes all within a pain free range.  Encouraged pt use pillow at home under R axilla for gentle prolonged stretch for R shoulder abd, making sure to use a pillow thin enough that allows pt to rest with her arm away from her side without pain.  Encouraged pt try this positioning 2x daily for 20-30 min.  Pt able to return demo and was able to place pillow as instructed without any physical  assist.     PATIENT EDUCATION: Education details:  Dressing/bathing strategies/AE/theraputty exercises Person educated: Patient Education method: Explanation, demonstration Education comprehension: verbalized understanding   HOME EXERCISE PROGRAM:  Theraputty for R hand grip and pinch strengthening, table slides    GOALS: Goals reviewed with patient? Yes  SHORT TERM GOALS: Target date: 04/06/2022    Pt. Will improve FOTO score by 2 points to reflect improved ADL performance Baseline: Eval: FOTO score: 12 Goal status: INITIAL  LONG TERM GOALS: Target date: 05/18/2022  Pt. Will independently demonstrate one armed adaptive techniques for LE ADLs. Baseline: Eval: MaxA.  Goal status: INITIAL  2.  Pt will independently demonstrate  one armed techniques for UE ADLs Baseline:  Eval: MaxA Goal status: INITIAL  3.  Pt. Will independently demonstrate one armed techniques for performing hair care. Baseline: Eval: MaxA Goal status: INITIAL  4.  Pt. Will button a shirt with modified independence Baseline: Eval: MaxA Goal status: INITIAL  5.  Pt. Will improve grip strength in her right hand by 3#  to be able to use her right hand to stabilize items while using then left hand during ADLs. Baseline: Eval: Right grip strength 8#, Left: 39# Goal status: INITIAL  6.  Pt. Will independently demonstrate self-ROM to the Right shoulder in preparation for repositioning the UE during ADLs, and IADL tasks. Baseline: Right shoulder flexion: 20(20), abduction: 62(62) Goal status: INITIAL  ASSESSMENT:  CLINICAL IMPRESSION: Pt reports she bought a hip kit and has been using the devices for bathing/dressing this week, and has found the tools to be very effective for conserving energy in her daily routine.  Pt was receptive to additional EC strategies learned this day, and plans to implement use of bathroom and laying on towels on the bed to skip the towel drying step which her spouse always helps  with.  Pt was able to simulate tying hair up in a towel this date with modified strategies, and will plan to try this at home after her showers this week.  Pt tolerated table slides well this date with cues for keeping all slides within a tolerable range to allow a gentle stretch.  Pt states she's been using her theraputty consistently at home for increasing  her grip strength.  Pt will benefit from continued OT services for ADL training, A/E training, UE there. Ex, neuromuscular re-education, and pt education about energy conservation/work simplification techniques during ADLs, and IADLs as well as one armed ADL/IADL compensatory strategies.   PERFORMANCE DEFICITS in functional skills including ADLs, IADLs, coordination, dexterity, sensation, ROM, strength, pain, FMC, GMC, mobility, and endurance, cognitive skills including emotional, and psychosocial skills including coping strategies, environmental adaptation, and routines and behaviors.   IMPAIRMENTS are limiting patient from ADLs, IADLs, education, work, leisure, and social participation.   COMORBIDITIES may have co-morbidities  that affects occupational performance. Patient will benefit from skilled OT to address above impairments and improve overall function.  MODIFICATION OR ASSISTANCE TO COMPLETE EVALUATION: Maximum or significant modification of tasks or assist is necessary to complete an evaluation.  OT OCCUPATIONAL PROFILE AND HISTORY: Comprehensive assessment: Review of records and extensive additional review of physical, cognitive, psychosocial history related to current functional performance.  CLINICAL DECISION MAKING: High - multiple treatment options, significant modification of task necessary  REHAB POTENTIAL: Good  EVALUATION COMPLEXITY: High    PLAN: OT FREQUENCY: 1x/week  OT DURATION: 12 weeks  PLANNED INTERVENTIONS: self care/ADL training, therapeutic exercise, therapeutic activity, neuromuscular re-education, manual  therapy, passive range of motion, and splinting, pt. Education was energy conservation/work simplification  RECOMMENDED OTHER SERVICES:   CONSULTED AND AGREED WITH PLAN OF CARE: Patient  PLAN FOR NEXT SESSION:  AE training/ADL strategies.  Leta Speller, MS, OTR/L   Darleene Cleaver, OT 03/07/2022, 9:43 AM

## 2022-03-14 ENCOUNTER — Ambulatory Visit: Payer: Medicare HMO

## 2022-03-16 ENCOUNTER — Other Ambulatory Visit: Payer: Self-pay | Admitting: Family Medicine

## 2022-03-16 DIAGNOSIS — F32A Depression, unspecified: Secondary | ICD-10-CM

## 2022-03-16 NOTE — Telephone Encounter (Signed)
Pended refill on upcoming visit 03/18/22

## 2022-03-18 ENCOUNTER — Encounter: Payer: Self-pay | Admitting: Family Medicine

## 2022-03-18 ENCOUNTER — Ambulatory Visit (INDEPENDENT_AMBULATORY_CARE_PROVIDER_SITE_OTHER): Payer: Medicare HMO | Admitting: Family Medicine

## 2022-03-18 VITALS — BP 108/64 | HR 80 | Temp 98.6°F | Ht 65.5 in | Wt 156.0 lb

## 2022-03-18 DIAGNOSIS — F32A Depression, unspecified: Secondary | ICD-10-CM

## 2022-03-18 DIAGNOSIS — F320 Major depressive disorder, single episode, mild: Secondary | ICD-10-CM

## 2022-03-18 DIAGNOSIS — R69 Illness, unspecified: Secondary | ICD-10-CM | POA: Diagnosis not present

## 2022-03-18 DIAGNOSIS — J9611 Chronic respiratory failure with hypoxia: Secondary | ICD-10-CM

## 2022-03-18 MED ORDER — ESCITALOPRAM OXALATE 10 MG PO TABS: ORAL_TABLET | ORAL | 0 refills | Status: DC

## 2022-03-18 NOTE — Patient Instructions (Signed)
Update if sleep is not improving after stopping cymbalta and if side effects to lexapro   Return to see me in 4-5 weeks - first week of October  Schedule Transfer of Care with [facility] for Early November

## 2022-03-18 NOTE — Assessment & Plan Note (Signed)
Minimal improvement and insomnia. Stop cymbalta. Start Lexapro 10 mg increase to 20 mg after 1-2 weeks. Update if insomnia persists or no change. Return in 5 weeks for check in. Again declined therapy

## 2022-03-18 NOTE — Progress Notes (Signed)
Subjective:     Maria [name] is a 66 y.o. female presenting for COPD (No changes about the same )     HPI  #Depression - about the same - not sleeping  - will go to bed falling asleep OK but waking up in the middle of the night - only getting 3-4 hours of sleep  #COPD - has had 2 rounds of prednisone w/o any improvement - feels the same - no improvement   Review of Systems   Social History   Tobacco Use  Smoking Status Former   Packs/day: 1.00   Years: 45.00   Total pack years: 45.00   Types: Cigarettes   Quit date: 03/18/2016   Years since quitting: 6.0  Smokeless Tobacco Never        Objective:    BP Readings from Last 3 Encounters:  03/18/22 108/64  02/15/22 (!) 100/50  02/04/22 140/68   Wt Readings from Last 3 Encounters:  03/18/22 156 lb (70.8 kg)  02/15/22 153 lb 4 oz (69.5 kg)  02/04/22 162 lb 8 oz (73.7 kg)    BP 108/64   Pulse 80   Temp 98.6 F (37 C) (Oral)   Ht 5' 5.5" (1.664 m)   Wt 156 lb (70.8 kg)   SpO2 96% Comment: 3-5 l o2  BMI 25.56 kg/m    Physical Exam Constitutional:      General: She is not in acute distress.    Appearance: She is well-developed. She is not diaphoretic.  HENT:     Right Ear: External ear normal.     Left Ear: External ear normal.     Nose: Nose normal.  Eyes:     Conjunctiva/sclera: Conjunctivae normal.  Cardiovascular:     Rate and Rhythm: Normal rate.     Heart sounds: Heart sounds are distant.  Pulmonary:     Effort: Pulmonary effort is normal.     Comments: Distant sounds Musculoskeletal:     Cervical back: Neck supple.  Skin:    General: Skin is warm and dry.     Capillary Refill: Capillary refill takes less than 2 seconds.  Neurological:     Mental Status: She is alert. Mental status is at baseline.  Psychiatric:        Mood and Affect: Mood normal.        Behavior: Behavior normal.        03/18/2022    8:39 AM 02/04/2022   10:38 AM 10/01/2021    9:11 AM  Depression screen PHQ  2/9  Decreased Interest '3 3 1  '$ Down, Depressed, Hopeless '2 3 1  '$ PHQ - 2 Score '5 6 2  '$ Altered sleeping '3 3 3  '$ Tired, decreased energy '3 3 1  '$ Change in appetite 0 2 1  Feeling bad or failure about yourself  0  1  Trouble concentrating 0 2 1  Moving slowly or fidgety/restless 0 0 0  Suicidal thoughts 0 0 0  PHQ-9 Score '11 16 9  '$ Difficult doing work/chores Not difficult at all Extremely dIfficult Very difficult         Assessment & Plan:   Problem List Items Addressed This Visit       Respiratory   Chronic respiratory failure with hypoxia (HCC)    Persistent cough and congestion since August. Stable on 3-5L o2. Has pulm f/u in 2 weeks.         Other   Depressive disorder   Relevant Medications  escitalopram (LEXAPRO) 10 MG tablet   Current mild episode of major depressive disorder without prior episode (Maria [name]) - Primary    Minimal improvement and insomnia. Stop cymbalta. Start Lexapro 10 mg increase to 20 mg after 1-2 weeks. Update if insomnia persists or no change. Return in 5 weeks for check in. Again declined therapy      Relevant Medications   escitalopram (LEXAPRO) 10 MG tablet     Return in about 5 weeks (around 04/22/2022).  [name], MD

## 2022-03-18 NOTE — Assessment & Plan Note (Signed)
Persistent cough and congestion since August. Stable on 3-5L o2. Has pulm f/u in 2 weeks.

## 2022-03-19 IMAGING — DX DG CERVICAL SPINE COMPLETE 4+V
5 series · 5 of 5 positions shown · non-contrast
Comparison: None.

CLINICAL DATA: Right upper extremity radicular pain

EXAM:
CERVICAL SPINE - COMPLETE 4+ VIEW

[cervical spine ap]
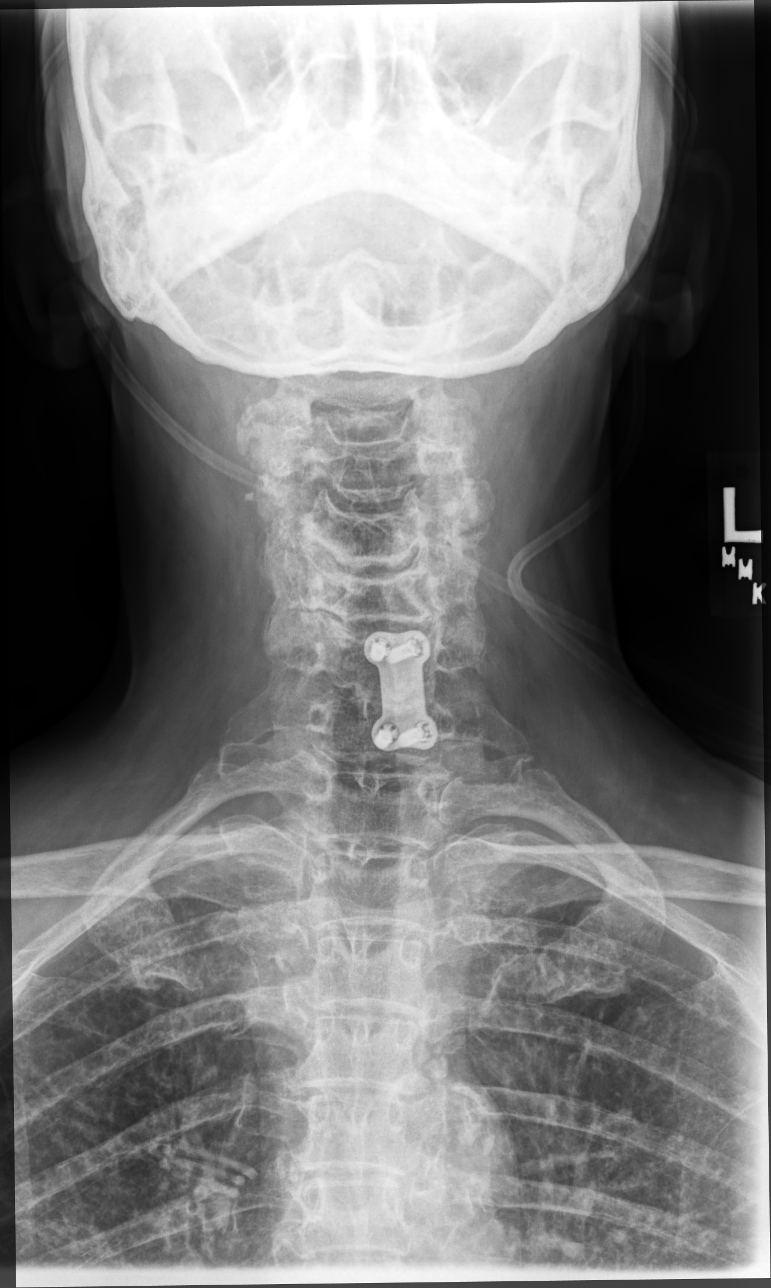

[cervical spine oblique (1 of 2)]
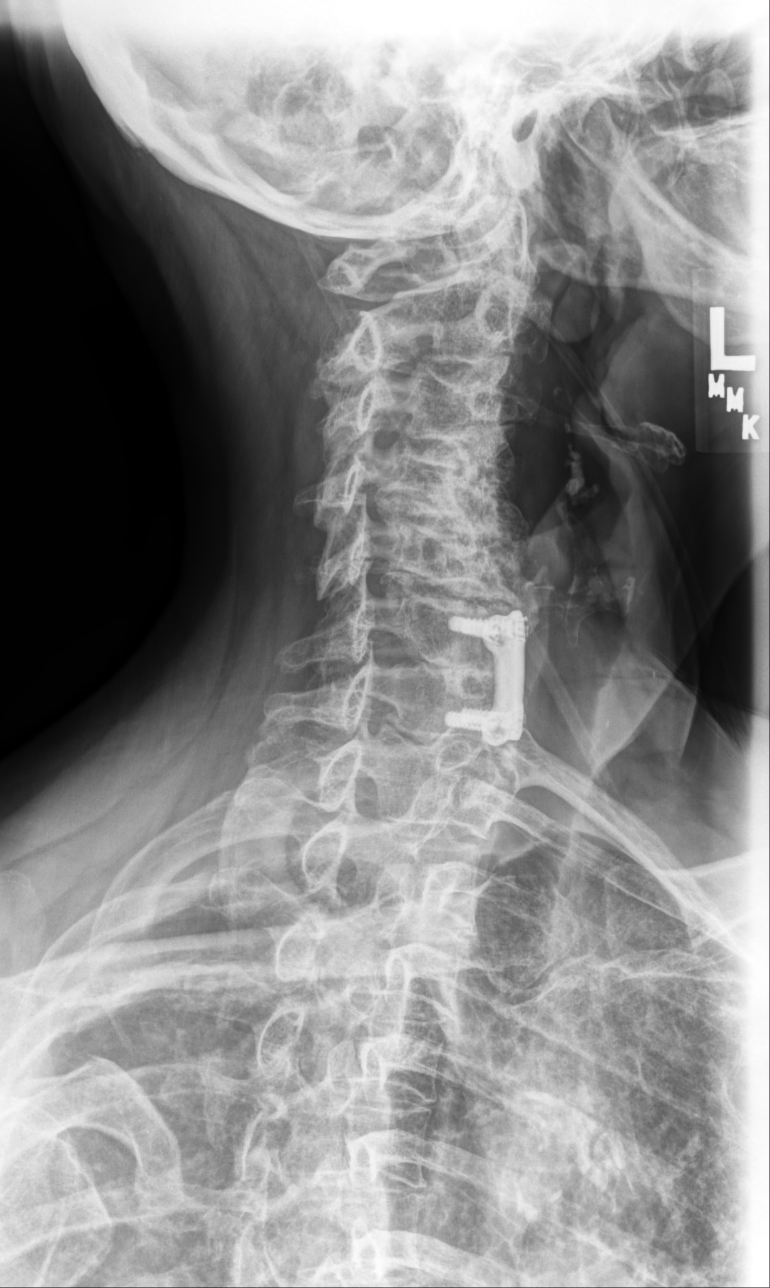

[cervical spine oblique (2 of 2)]
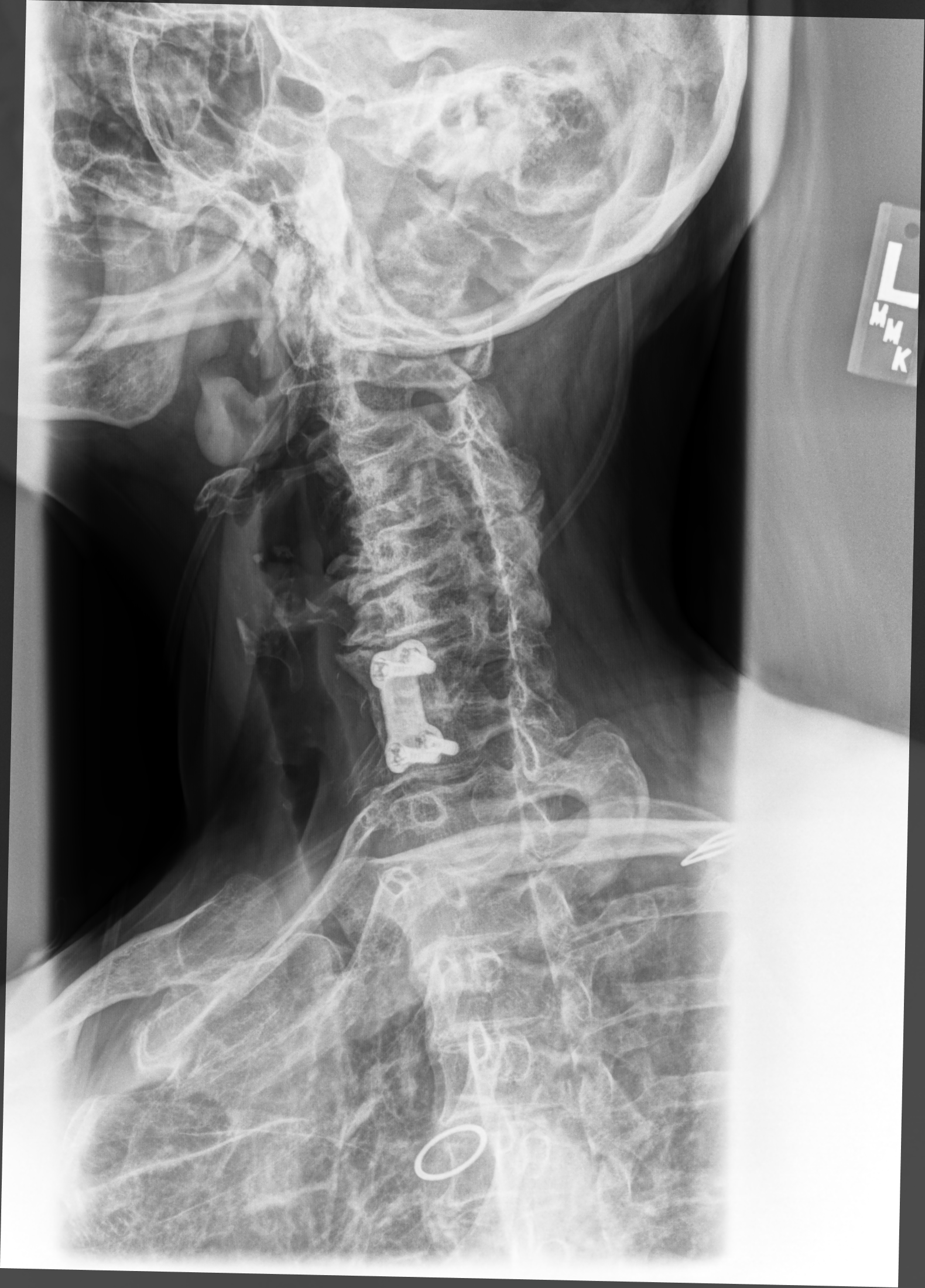

[cervical spine lat]
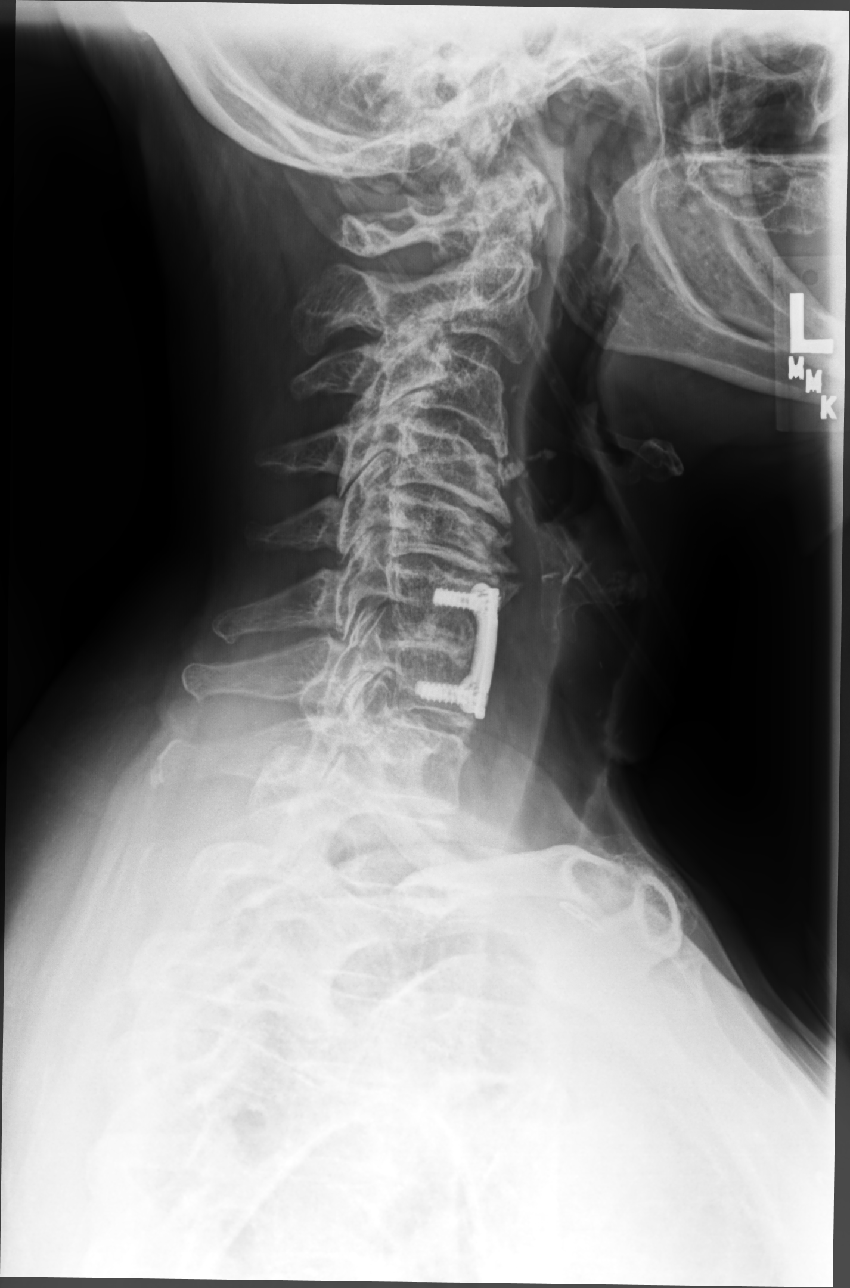

[cervical spine open mouth ap]
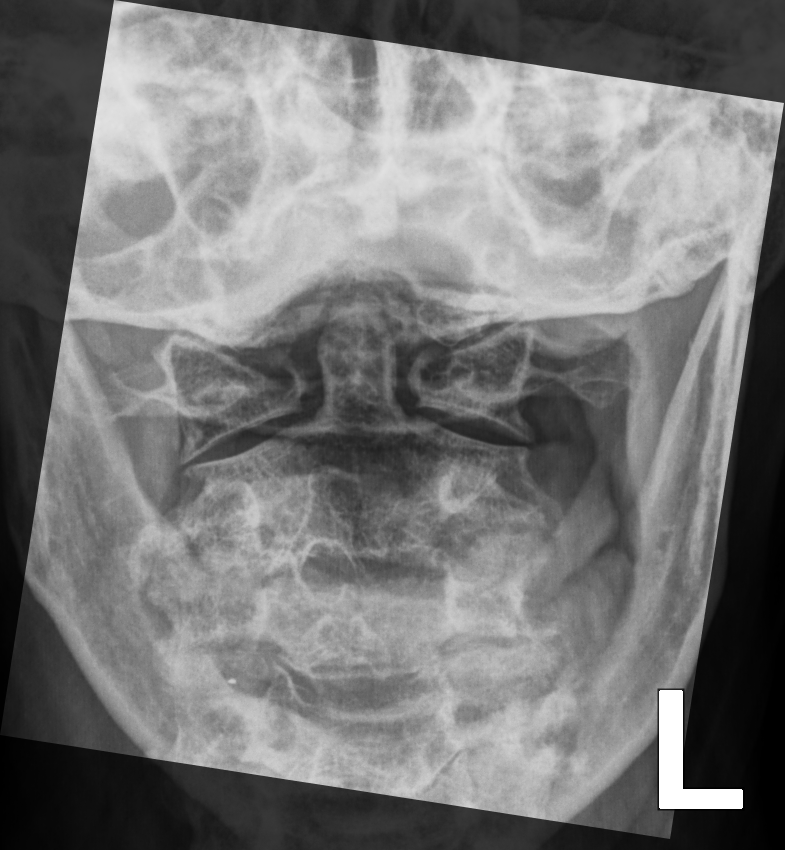

[5 of 5 positions shown; findings below may reference images not displayed]

FINDINGS: C6-7 anterior cervical discectomy infusion with instrumentation and
performed with solid incorporation of interbody bone graft. Mild
cervical kyphosis. No acute fracture or listhesis of the cervical
spine. Vertebral body height is preserved. There is intervertebral
disc space narrowing and endplate remodeling at C3-T1, most severe
at C4-C6 in keeping with changes of moderate to severe degenerative
disc disease. The prevertebral soft tissues are not thickened. The
spinal canal is widely patent. Oblique views demonstrate mild
neuroforaminal narrowing on the right at C2-3 C3-4, and C5-6 and
moderate neuroforaminal narrowing at C4-5 secondary to uncovertebral
arthrosis. On the left, there is moderate neuroforaminal narrowing
at C3-4, C4-5, and C5-6 secondary to combination uncovertebral and
facet arthrosis.
IMPRESSION: No acute fracture or listhesis.

ACDF C6-7

Multilevel degenerative disc and degenerative joint disease
resulting in multilevel foraminal narrowing as described above.

## 2022-03-19 IMAGING — DX DG SHOULDER 2+V*R*
2 series · 2 of 2 positions shown · non-contrast
Comparison: None.

CLINICAL DATA: Radicular pain [REDACTED]reased range of motion of right
shoulder.

EXAM:
RIGHT SHOULDER - 2+ VIEW

[shoulder (grashey) ap]
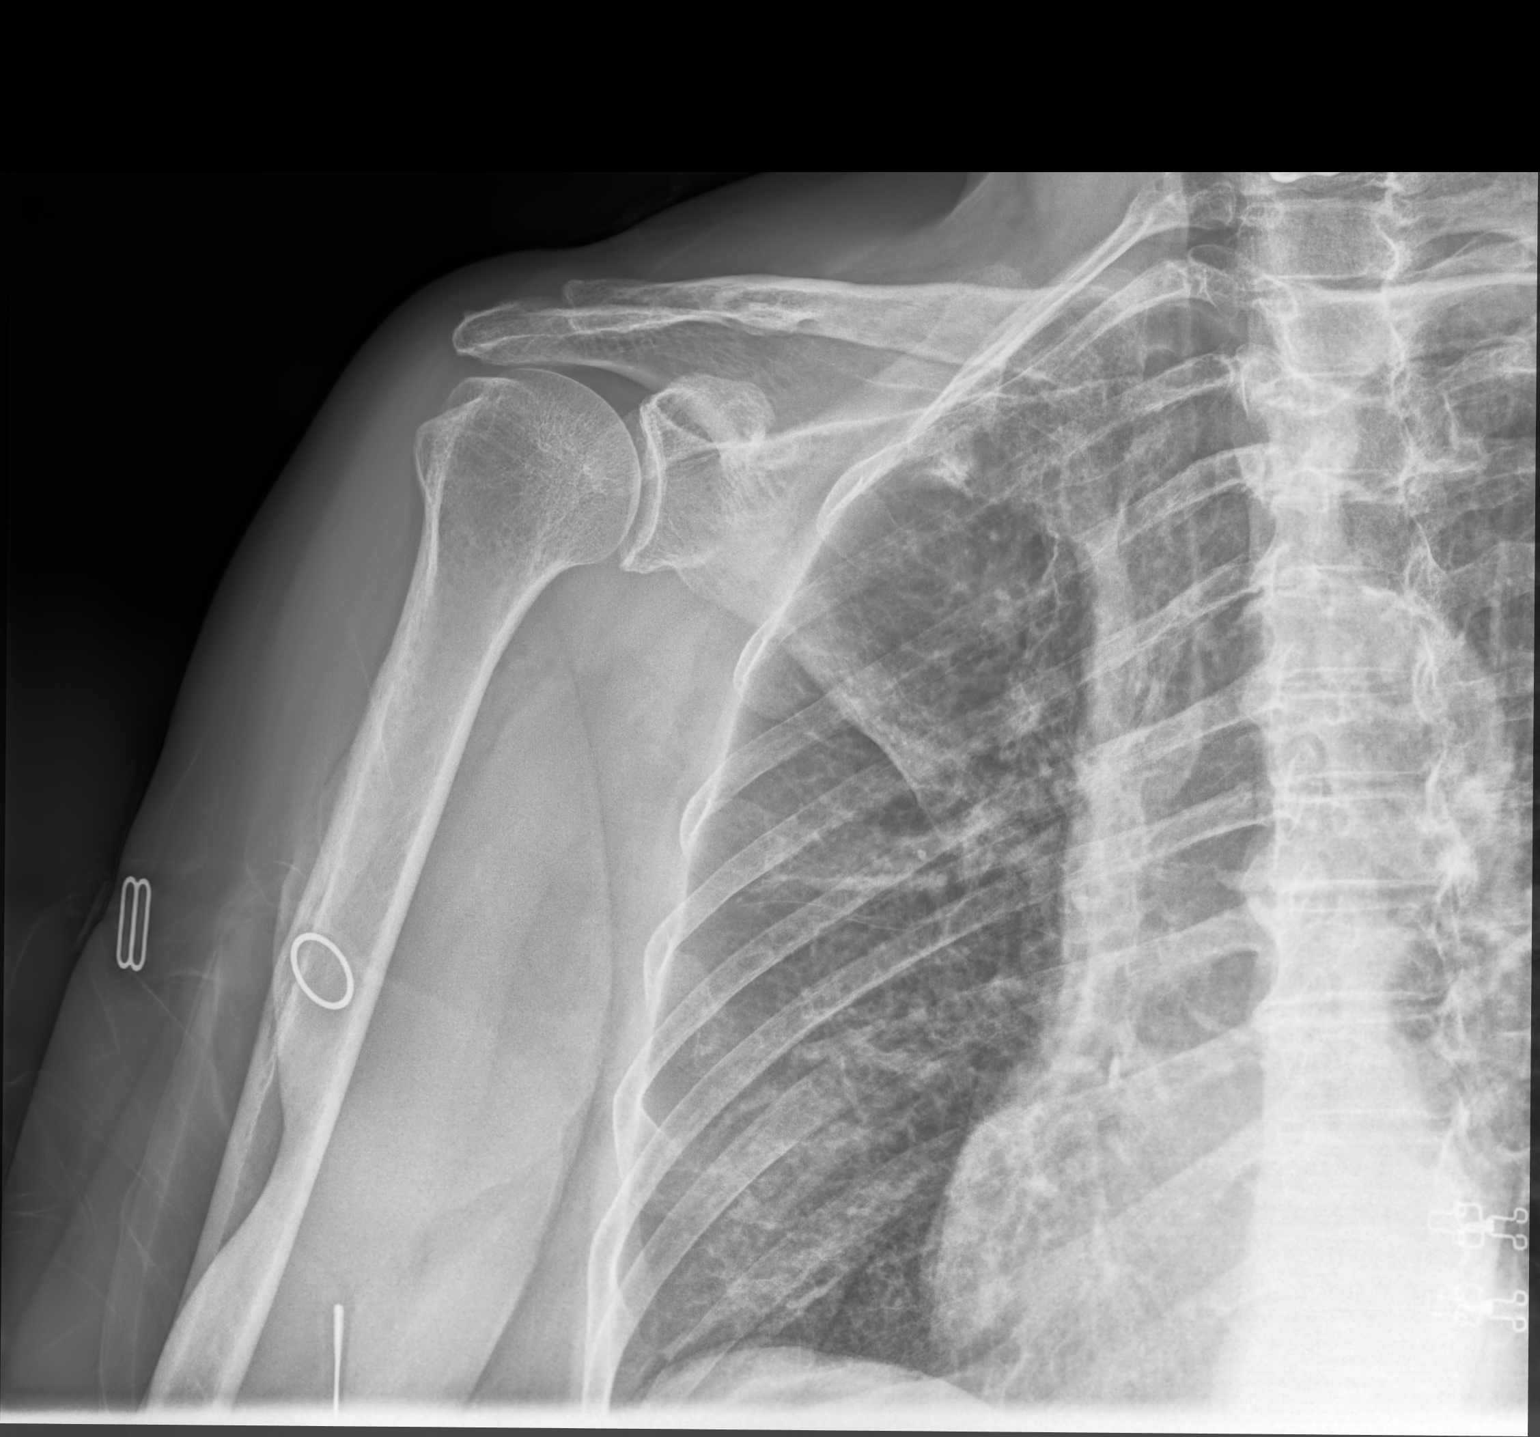

[shoulder y view]
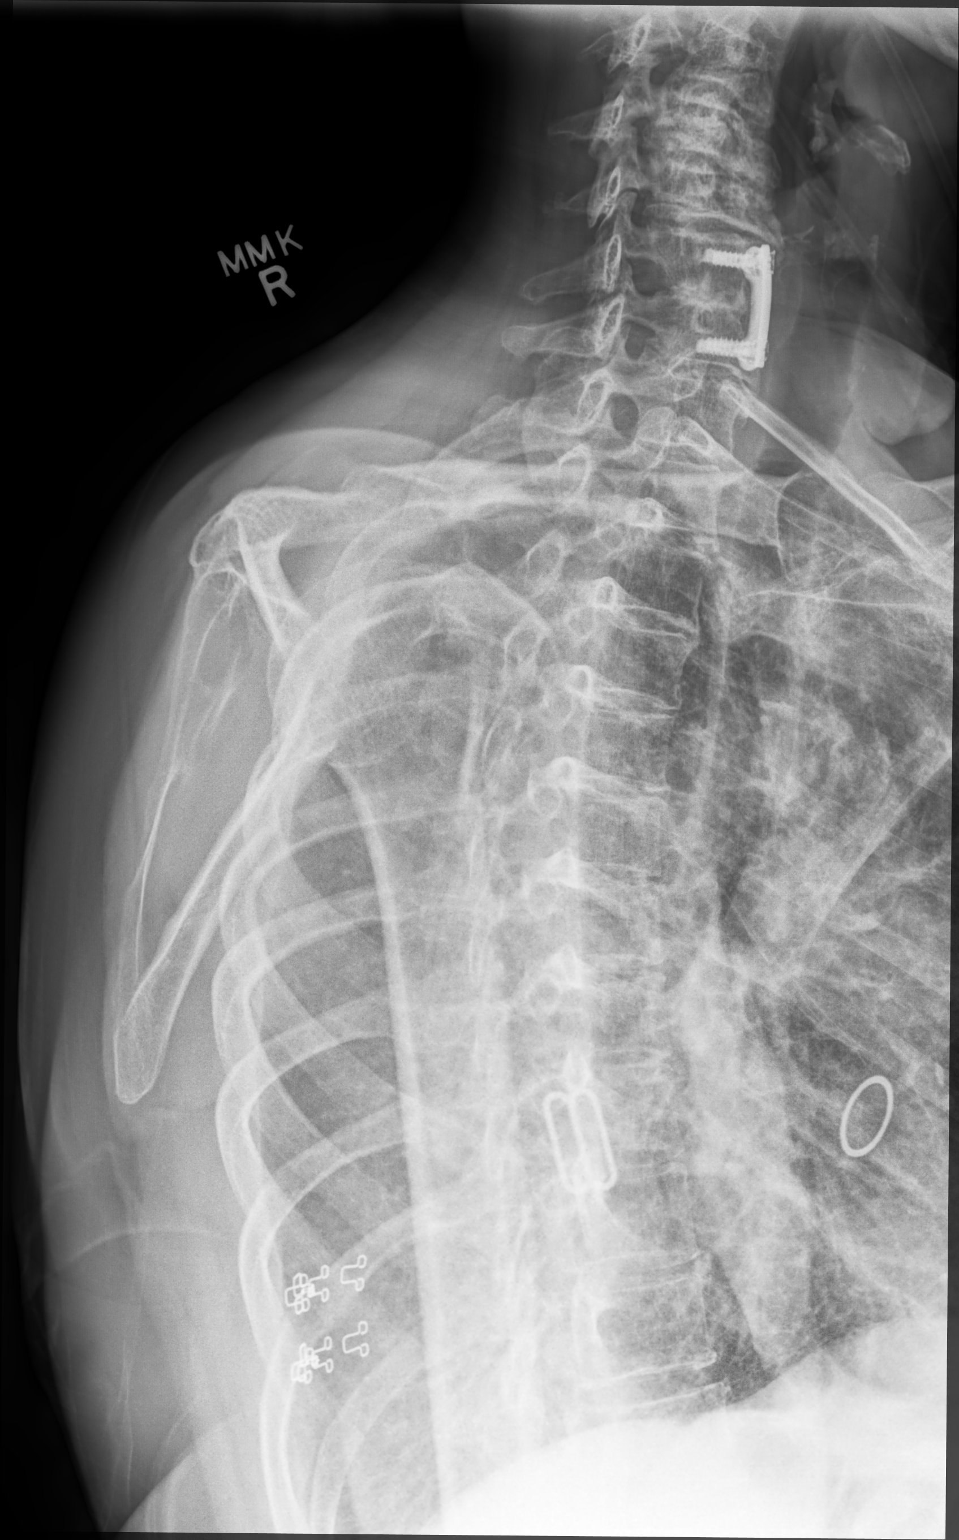

[2 of 2 positions shown; findings below may reference images not displayed]

FINDINGS: Mild inferior glenoid degenerative osteophytosis. No significant
degenerative change of the right acromioclavicular joint. No
significant glenohumeral joint space narrowing. No acute fracture or
dislocation. C6-7 ACDF hardware is noted.
IMPRESSION: Mild glenohumeral osteoarthritis.

## 2022-03-23 ENCOUNTER — Ambulatory Visit: Payer: Medicare HMO | Attending: Family Medicine

## 2022-03-23 DIAGNOSIS — M6281 Muscle weakness (generalized): Secondary | ICD-10-CM | POA: Insufficient documentation

## 2022-03-23 DIAGNOSIS — M792 Neuralgia and neuritis, unspecified: Secondary | ICD-10-CM | POA: Diagnosis not present

## 2022-03-23 DIAGNOSIS — U071 COVID-19: Secondary | ICD-10-CM | POA: Diagnosis not present

## 2022-03-23 NOTE — Therapy (Signed)
OUTPATIENT OCCUPATIONAL THERAPY NEURO TREATMENT  Patient Name: Maria ROSELL MRN: [id number] DOB:10/17/1955, 66 y.o., female Today's Date: 03/23/2022  PCP: Waunita Schooner NP REFERRING PROVIDER: Waunita Schooner NP    OT End of Session - 03/23/22 1316     Visit Number 4    Number of Visits 12    Date for OT Re-Evaluation 05/18/22    OT Start Time 0840    OT Stop Time 0906    OT Time Calculation (min) 26 min    Activity Tolerance Patient tolerated treatment well    Behavior During Therapy [facility] for tasks assessed/performed             Past Medical History:  Diagnosis Date   Atherosclerosis [id number]/0612014   aorta, iliacs and CFA bilaterally no greater than 0-49% - lower arterial doppler.   Blood transfusion without reported diagnosis 2005   Cancer [facility]) Ovarian 1978   Chest pain    COPD (chronic obstructive pulmonary disease) ([place])    COVID-19 long hauler manifesting chronic dyspnea    Emphysema of lung ([place]) 2019   Oxygen deficiency 03/2019   Oxygen dependent    3 liters up to 5   Past Surgical History:  Procedure Laterality Date   [place]  2010   COLONOSCOPY     COLONOSCOPY     COLONOSCOPY WITH PROPOFOL N/A 11/01/2021   Procedure: COLONOSCOPY WITH PROPOFOL;  Surgeon: Jonathon Bellows, MD;  Location: [facility] ENDOSCOPY;  Service: Gastroenterology;  Laterality: N/A;   ESOPHAGOGASTRODUODENOSCOPY     [place] SURGERY  Neck 2002   Patient Active Problem List   Diagnosis Date Noted   Acute on chronic respiratory failure with hypoxemia ([place]) 02/27/2022   Atherosclerosis of native arteries of extremity with intermittent claudication ([place]) 01/28/2022   Preop pulmonary/respiratory exam 12/15/2021   COPD exacerbation ([place]) 10/25/2021   Current mild episode of major depressive disorder without prior episode ([place]) 10/01/2021   Acute pain of right shoulder 08/09/2021   History of cervical spinal surgery  08/09/2021   Radicular pain in right arm 08/09/2021   Numbness and tingling in both hands 06/17/2021   Carotid stenosis 06/15/2021   Hyperlipidemia 06/15/2021   Right foot pain 05/11/2021   Stress incontinence 03/15/2021   Urinary frequency 03/15/2021   Pulsatile tinnitus of left ear 03/15/2021   Chronic cough 12/11/2020   Prediabetes 09/30/2019   Tear of left gluteus minimus tendon 09/30/2019   Aortic atherosclerosis ([place]) 09/30/2019   Emphysema due to alpha-1-antitrypsin deficiency ([place]) 09/16/2019   Cheilitis 08/27/2019   Other microscopic hematuria 08/27/2019   Bronchiectasis without complication ([place]) 94/70/9628   Chronic respiratory failure with hypoxia ([place]) 04/09/2019   Pneumonia due to COVID-19 virus 02/27/2019   COVID-19 virus infection 02/26/2019   Stage 2 moderate COPD by GOLD classification ([place]) 02/26/2019   Syncope 02/26/2019   Orthostatic hypotension 02/26/2019   Left buttock pain 04/12/2018   Anxiety 01/30/2017   Chronic constipation 11/04/2016   Chronic bronchitis ([place]) 07/14/2015   Smoking history 04/15/2015   Benign neoplasm of parathyroid gland 06/04/2012   Bunion 06/04/2012   Enthesopathy of ankle and tarsus 06/04/2012   Gout 08/10/2010   History of DVT (deep vein thrombosis) 05/21/2010   Depressive disorder 02/11/2008    ONSET DATE: 08/18/2021  REFERRING DIAG: History of cervical spinal surgery; Radicular pain in R arm  THERAPY DIAG:  COVID  Radicular pain in right arm  Muscle weakness (generalized)  Rationale for Evaluation and Treatment Rehabilitation  SUBJECTIVE:   SUBJECTIVE STATEMENT: Pt reports 9/10 pain in the R shoulder this date.  Moist heat applied to R shoulder during session for pain management/muscle relaxation with good tolerance.  PERTINENT HISTORY: pt. Is a 66 y.o. female who presents with a history of  chronic cough post COVID-19. Per Pt. Chart, Pt. Had  a critical  illness in 3546 complicated by a PE as pt. on HRT, and  prolonged vent dependent Respiratory failure. Pt. Has a history of severe limitations from RUE /neck radicular pain which began in 08/2021. Pt. Reports having seen an orthopedic surgeon in [place], [place] through the [facility]. Pt. reports that it was determined that the Pt. Is not a candidate for surgery due to her respiratory status. Pt. Has been [facility], and has her first appointment in September. PMHx includes: Remote history of neck surgery, CAD, HTN,  Hyperlipidemia.   PAIN:  Are you having pain? Yes: NPRS scale: 9/10 Pain location: Right side of the neck to the shoulder Pain description: Sharp shooting, constant Aggravating factors: moving her right arm Relieving factors: keeping the right arm at her side, with the elbow flexed  PLOF: Independent with basic ADLs  PATIENT GOALS  Learning how to shower, wash hair, and getting dressed  OBJECTIVE:  ACTIVITY TOLERANCE Activity tolerance: Pt. Is able to tolerate than 10 min. Of activity before requiring rest breaks.  FUNCTIONAL OUTCOME MEASURES: FOTO: 12  UPPER EXTREMITY ROM      ROM Right Eval 10/10 pain with shoulder ROM Left WFL  Shoulder flexion 20(20)   Shoulder abduction 62(62)   Shoulder adduction    Shoulder extension    Shoulder internal rotation    Shoulder external rotation    Elbow flexion 78   Elbow extension N/A   Wrist flexion    Wrist extension 38(46)   Wrist ulnar deviation    Wrist radial deviation    Wrist pronation    Wrist supination    (Blank rows = not tested)   UPPER EXTREMITY MMT:     MMT Right eval Left Eval  Shoulder flexion 2-/5 4/5  Shoulder abduction 2/5 4/5  Shoulder adduction    Shoulder extension    Shoulder internal rotation    Shoulder external rotation    Middle trapezius    Lower trapezius    Elbow flexion 2/5 4/5  Elbow extension  4/5  Wrist flexion    Wrist extension 3-/5 4/5  Wrist ulnar deviation    Wrist radial  deviation    Wrist pronation    Wrist supination    (Blank rows = not tested)  HAND FUNCTION: Grip strength: Right: 8#, Left: 39# Lateral Pinch: Right: 3#, Left 10# 3pt. Pinch: Right: 1#, Left: 7#   OBSERVATIONS: Pt. Maintains her RUE adducted to her side, elbow slightly flexed with her hand in her lap.  Pt. Has home O2 portable tank with an extended battery.   TODAY'S TREATMENT:   Self Care: Pt brought in her hip kit from home for dressing stick demo.  OT demonstrated use of dressing stick to don button up shirt and sweater keeping R arm at side and using stick to pull around L arm sleeve behind head.  Pt preferred not to attempt today d/t severity of pain, but verbalized understanding with use as OT provided 3 demos.  Encouraged pt to practice at home as tolerated and try with a variety  of shirts.  Educated pt on additional pieces of AE, including bottom buddy, button hook, and long handled lotion applicator.  OT provided simulation or visual for all and provided education on benefits of each item.  OT provided information for options to obtain if desired.  Reviewed supportive positioning for RUE, and used pillow today under R forearm/elbow to support shoulder.  Pt reports consistent use of pillow at home and has been using lumbar pillow to wedge under R axilla for providing a gentle stretch to increase abd for ADLs.  Educated on on increasing pillow size as tolerated.  Pt verbalized understanding.   PATIENT EDUCATION: Education details:  AE for ADLs Person educated: Patient Education method: Explanation, demonstration Education comprehension: verbalized understanding   HOME EXERCISE PROGRAM:  Theraputty for R hand grip and pinch strengthening, table slides    GOALS: Goals reviewed with patient? Yes  SHORT TERM GOALS: Target date: 04/06/2022    Pt. Will improve FOTO score by 2 points to reflect improved ADL performance Baseline: Eval: FOTO score: 12 Goal status: INITIAL  LONG  TERM GOALS: Target date: 05/18/2022  Pt. Will independently demonstrate one armed adaptive techniques for LE ADLs. Baseline: Eval: MaxA.  Goal status: INITIAL  2.  Pt will independently demonstrate  one armed techniques for UE ADLs Baseline:  Eval: MaxA Goal status: INITIAL  3.  Pt. Will independently demonstrate one armed techniques for performing hair care. Baseline: Eval: MaxA Goal status: INITIAL  4.  Pt. Will button a shirt with modified independence Baseline: Eval: MaxA Goal status: INITIAL  5.  Pt. Will improve grip strength in her right hand by 3#  to be able to use her right hand to stabilize items while using then left hand during ADLs. Baseline: Eval: Right grip strength 8#, Left: 39# Goal status: INITIAL  6.  Pt. Will independently demonstrate self-ROM to the Right shoulder in preparation for repositioning the UE during ADLs, and IADL tasks. Baseline: Right shoulder flexion: 20(20), abduction: 62(62) Goal status: INITIAL  ASSESSMENT:  CLINICAL IMPRESSION: Pt reports that she's been consistently using her hip kit items and feels these tools make a great difference in her indep.  Pt is using her long handled sponge for applying lotion to her legs, using the LB dressing aids, and even took her reacher to the grocery store to enable pt to reach higher shelves without stepping out of the motorized scooter.  Pt responded well to moist heat today for R shoulder pain.  Pt was receptive to all education and verbalized understanding of simulated use of bottom buddy, video of button hook, and visual demo of use of dressing stick to don a sweater or button up shirt.  Pt declined any grip strengthening or ROM this date for her shoulder d/t severity of pain.  OT reviewed table slides and pt reports consistent use of putty at home, and OT reminded to perform shoulder mobility within a tolerable range.  Pt reports that placing pillow under R axilla at home has increased her shoulder abd and  she states that it's been easier to use her long handled sponge to wash under her arms with this increased ROM.  Planning 1 additional visit for trial of button hook (this item not available in clinic today but will be by next session) and then anticipate readiness for OT d/c.  Pt in agreement with plan.   PERFORMANCE DEFICITS in functional skills including ADLs, IADLs, coordination, dexterity, sensation, ROM, strength, pain, FMC, GMC, mobility, and endurance, cognitive  skills including emotional, and psychosocial skills including coping strategies, environmental adaptation, and routines and behaviors.   IMPAIRMENTS are limiting patient from ADLs, IADLs, education, work, leisure, and social participation.   COMORBIDITIES may have co-morbidities  that affects occupational performance. Patient will benefit from skilled OT to address above impairments and improve overall function.  MODIFICATION OR ASSISTANCE TO COMPLETE EVALUATION: Maximum or significant modification of tasks or assist is necessary to complete an evaluation.  OT OCCUPATIONAL PROFILE AND HISTORY: Comprehensive assessment: Review of records and extensive additional review of physical, cognitive, psychosocial history related to current functional performance.  CLINICAL DECISION MAKING: High - multiple treatment options, significant modification of task necessary  REHAB POTENTIAL: Good  EVALUATION COMPLEXITY: High    PLAN: OT FREQUENCY: 1x/week  OT DURATION: 12 weeks  PLANNED INTERVENTIONS: self care/ADL training, therapeutic exercise, therapeutic activity, neuromuscular re-education, manual therapy, passive range of motion, and splinting, pt. Education was energy conservation/work simplification  RECOMMENDED OTHER SERVICES:   CONSULTED AND AGREED WITH PLAN OF CARE: Patient  PLAN FOR NEXT SESSION:  AE training/ADL strategies.  Leta Speller, MS, OTR/L   Darleene Cleaver, OT 03/23/2022, 1:18 PM

## 2022-03-24 DIAGNOSIS — J449 Chronic obstructive pulmonary disease, unspecified: Secondary | ICD-10-CM | POA: Diagnosis not present

## 2022-03-24 DIAGNOSIS — J9611 Chronic respiratory failure with hypoxia: Secondary | ICD-10-CM | POA: Diagnosis not present

## 2022-03-25 ENCOUNTER — Ambulatory Visit
Admission: RE | Admit: 2022-03-25 | Discharge: 2022-03-25 | Disposition: A | Discharge status: Home / Self Care | Payer: Medicare HMO | Source: Ambulatory Visit | Attending: Internal Medicine | Admitting: Internal Medicine

## 2022-03-25 DIAGNOSIS — I700 Atherosclerosis of aorta: Secondary | ICD-10-CM | POA: Diagnosis not present

## 2022-03-25 DIAGNOSIS — J449 Chronic obstructive pulmonary disease, unspecified: Secondary | ICD-10-CM | POA: Diagnosis not present

## 2022-03-25 DIAGNOSIS — J439 Emphysema, unspecified: Secondary | ICD-10-CM | POA: Diagnosis not present

## 2022-03-25 DIAGNOSIS — R0602 Shortness of breath: Secondary | ICD-10-CM | POA: Diagnosis not present

## 2022-03-28 ENCOUNTER — Ambulatory Visit: Payer: Medicare HMO

## 2022-03-28 DIAGNOSIS — M792 Neuralgia and neuritis, unspecified: Secondary | ICD-10-CM

## 2022-03-28 DIAGNOSIS — M6281 Muscle weakness (generalized): Secondary | ICD-10-CM | POA: Diagnosis not present

## 2022-03-28 DIAGNOSIS — U071 COVID-19: Secondary | ICD-10-CM | POA: Diagnosis not present

## 2022-03-28 NOTE — Therapy (Signed)
OUTPATIENT OCCUPATIONAL THERAPY NEURO DISCHARGE  Patient Name: Maria [name] MRN: [id number] DOB:[birth year], 66 y.o., female Today's Date: 03/28/2022  PCP: Waunita Schooner NP REFERRING PROVIDER: Waunita Schooner NP    OT End of Session - 03/28/22 0835     Visit Number 5    Number of Visits 12    Date for OT Re-Evaluation 05/18/22    OT Start Time 0831    OT Stop Time 0915    OT Time Calculation (min) 44 min    Activity Tolerance Patient tolerated treatment well    Behavior During Therapy [facility] for tasks assessed/performed             Past Medical History:  Diagnosis Date   Atherosclerosis [id number]/0612014   aorta, iliacs and CFA bilaterally no greater than 0-49% - lower arterial doppler.   Blood transfusion without reported diagnosis 2005   Cancer [facility]) Ovarian 1978   Chest pain    COPD (chronic obstructive pulmonary disease) ([place])    COVID-19 long hauler manifesting chronic dyspnea    Emphysema of lung ([place]) 2019   Oxygen deficiency 03/2019   Oxygen dependent    3 liters up to 5   Past Surgical History:  Procedure Laterality Date   [place]  2010   COLONOSCOPY     COLONOSCOPY     COLONOSCOPY WITH PROPOFOL N/A 11/01/2021   Procedure: COLONOSCOPY WITH PROPOFOL;  Surgeon: Jonathon Bellows, MD;  Location: [facility] ENDOSCOPY;  Service: Gastroenterology;  Laterality: N/A;   ESOPHAGOGASTRODUODENOSCOPY     [place] SURGERY  Neck 2002   Patient Active Problem List   Diagnosis Date Noted   Acute on chronic respiratory failure with hypoxemia ([place]) 02/27/2022   Atherosclerosis of native arteries of extremity with intermittent claudication ([place]) 01/28/2022   Preop pulmonary/respiratory exam 12/15/2021   COPD exacerbation ([place]) 10/25/2021   Current mild episode of major depressive disorder without prior episode ([place]) 10/01/2021   Acute pain of right shoulder 08/09/2021   History of cervical spinal surgery  08/09/2021   Radicular pain in right arm 08/09/2021   Numbness and tingling in both hands 06/17/2021   Carotid stenosis 06/15/2021   Hyperlipidemia 06/15/2021   Right foot pain 05/11/2021   Stress incontinence 03/15/2021   Urinary frequency 03/15/2021   Pulsatile tinnitus of left ear 03/15/2021   Chronic cough 12/11/2020   Prediabetes 09/30/2019   Tear of left gluteus minimus tendon 09/30/2019   Aortic atherosclerosis ([place]) 09/30/2019   Emphysema due to alpha-1-antitrypsin deficiency ([place]) 09/16/2019   Cheilitis 08/27/2019   Other microscopic hematuria 08/27/2019   Bronchiectasis without complication ([place]) 96/29/5284   Chronic respiratory failure with hypoxia ([place]) 04/09/2019   Pneumonia due to COVID-19 virus 02/27/2019   COVID-19 virus infection 02/26/2019   Stage 2 moderate COPD by GOLD classification ([place]) 02/26/2019   Syncope 02/26/2019   Orthostatic hypotension 02/26/2019   Left buttock pain 04/12/2018   Anxiety 01/30/2017   Chronic constipation 11/04/2016   Chronic bronchitis ([place]) 07/14/2015   Smoking history 04/15/2015   Benign neoplasm of parathyroid gland 06/04/2012   Bunion 06/04/2012   Enthesopathy of ankle and tarsus 06/04/2012   Gout 08/10/2010   History of DVT (deep vein thrombosis) 05/21/2010   Depressive disorder 02/11/2008    ONSET DATE: 08/18/2021  REFERRING DIAG: History of cervical spinal surgery; Radicular pain in R arm  THERAPY DIAG:  COVID  Radicular pain in right arm  Rationale for Evaluation and Treatment Rehabilitation  SUBJECTIVE:   SUBJECTIVE STATEMENT: Pt reports that her husband ordered her a button hook over the weekend, but it has not yet arrived.  Pt practiced with button hook in clinic today.    PERTINENT HISTORY: pt. Is a 66 y.o. female who presents with a history of  chronic cough post COVID-19. Per Pt. Chart, Pt. Had  a critical  illness in 0233 complicated by a PE as pt. on HRT, and prolonged vent dependent Respiratory failure.  Pt. Has a history of severe limitations from RUE /neck radicular pain which began in 08/2021. Pt. Reports having seen an orthopedic surgeon in [place], [place] through the [facility]. Pt. reports that it was determined that the Pt. Is not a candidate for surgery due to her respiratory status. Pt. Has been referred to [facility], and has her first appointment in September. PMHx includes: Remote history of neck surgery, CAD, HTN,  Hyperlipidemia.   PAIN:  Are you having pain? Yes: NPRS scale: 8-9/10 Pain location: Right side of the neck to the shoulder Pain description: Sharp shooting, constant Aggravating factors: moving her right arm Relieving factors: keeping the right arm at her side, with the elbow flexed  PLOF: Independent with basic ADLs  PATIENT GOALS  Learning how to shower, wash hair, and getting dressed  OBJECTIVE:  ACTIVITY TOLERANCE Activity tolerance: Pt. Is able to tolerate than 10 min. Of activity before requiring rest breaks.  FUNCTIONAL OUTCOME MEASURES: Eval: FOTO: 12 ; 03/28/22 d/c: FOTO 37  UPPER EXTREMITY ROM      ROM Right Eval 10/10 pain with shoulder ROM Left [facility] Right 03/28/22 D/C  Shoulder flexion 20(20)  Active 32  Shoulder abduction 62(62)  Active 47  Shoulder adduction     Shoulder extension     Shoulder internal rotation     Shoulder external rotation     Elbow flexion 78    Elbow extension N/A    Wrist flexion     Wrist extension 38(46)    Wrist ulnar deviation     Wrist radial deviation     Wrist pronation     Wrist supination     (Blank rows = not tested)   UPPER EXTREMITY MMT:     MMT Right eval Left Eval  Shoulder flexion 2-/5 4/5  Shoulder abduction 2/5 4/5  Shoulder adduction    Shoulder extension    Shoulder internal rotation    Shoulder external rotation    Middle trapezius    Lower trapezius    Elbow flexion 2/5 4/5  Elbow extension  4/5  Wrist flexion    Wrist extension 3-/5 4/5  Wrist ulnar  deviation    Wrist radial deviation    Wrist pronation    Wrist supination    (Blank rows = not tested)  HAND FUNCTION: Grip strength: Right: 8#, Left: 39#; 03/28/22: R: 5# (significant pain this date in R shoulder) ; L: 55# Lateral Pinch: Right: 3#, Left 10#; 9/11//23: R: 5#, L: 11# 3pt. Pinch: Right: 1#, Left: 7#; 03/28/22: R: 4#, L: 7#   OBSERVATIONS: Pt. Maintains her RUE adducted to her side, elbow slightly flexed with her hand in her lap.  Pt. Has home O2 portable tank with an extended battery.   TODAY'S TREATMENT:  Therapeutic Exercise: Objective measures taken and goals updated for discharge.   Self Care: Instructed in use of button hook.  Pt practiced with large and small buttons on a shirt, buttons  on L and buttons on R side of shirt.  Mirror placed in front of pt for increased visual.  Able to use button hook on both types of shirts, using mirror, initial min vc for technique, and extra time.  With repetition, able to manage buttons with modified indep.  Pt reports she will continue to practice with her new button hook at home when it arrives.   PATIENT EDUCATION: Education details:  AE for ADLs Person educated: Patient Education method: Explanation, demonstration Education comprehension: verbalized understanding   HOME EXERCISE PROGRAM:  Theraputty for R hand grip and pinch strengthening, table slides    GOALS: Goals reviewed with patient? Yes  SHORT TERM GOALS: Target date: 04/06/2022    Pt. Will improve FOTO score by 2 points to reflect improved ADL performance Baseline: Eval: FOTO score: 12; 03/28/22 d/c: FOTO 37 Goal status: achieved  LONG TERM GOALS: Target date: 05/18/2022  Pt. Will independently demonstrate one armed adaptive techniques for LE ADLs. Baseline: Eval: MaxA; 03/28/22 d/c: mod I with AE Goal status: achieved  2.  Pt will independently demonstrate  one armed techniques for UE ADLs Baseline:  Eval: MaxA; 03/28/22: modified indep Goal status:  achieved  3.  Pt. Will independently demonstrate one armed techniques for performing hair care. Baseline: Eval: MaxA; 03/28/22 d/c: spouse continues to put hair in pony tail, but pt can now brush, wash, and dry hair with modified/1 armed techniques and AE. Goal status: partially met  4.  Pt. Will button a shirt with modified independence Baseline: Eval: MaxA; 03/28/22 d/c: modified indep with button hook and mirror Goal status: achieved  5.  Pt. Will improve grip strength in her right hand by 3#  to be able to use her right hand to stabilize items while using then left hand during ADLs. Baseline: Eval: Right grip strength 8#, Left: 39#; 03/28/22 d/c: R grip 5# (significant pain in R shoulder this date), L 55# (pt uses R hand as a stabilizer when arm is propped on pillow, but continues to be limited by pain) Goal status: partially met  6.  Pt. Will independently demonstrate self-ROM to the Right shoulder in preparation for repositioning the UE during ADLs, and IADL tasks. Baseline: Right shoulder flexion: 20(20), abduction: 62(62); 03/28/22 d/c: R active shoulder flexion 32, abd 47 (increased pain today, but pt does report easier time washing under arm and donning shirt).  Pt positions pillow under R upper arm when resting during the day and feels it's easier to bring arm away from side with less pain during ADLs as a result of this positioning strategy.  Goal status: achieved   ASSESSMENT:  CLINICAL IMPRESSION: Pt has been seen for 5 OT visits to provide RUE self PROM HEP, pain management strategies, and ADL training including AE and 1 arm strategies.  Pt's FOTO score has increased from 12 to 37 at discharge.  Pt now has a hip kit which she uses to perform UB/LB dressing and bathing with modified indep.  Pt is indep with HEP and ADL strategies.  Pain continues to be severe in the neck and R shoulder, though pt has appointment to start with the pain clinic end of this month.  Pt responded well to  moist heat during OT sessions and has been given options to obtain hot packs for home use.  All OT goals sufficiently met and pt reports readiness for d/c.    PERFORMANCE DEFICITS in functional skills including ADLs, IADLs, coordination, dexterity, sensation, ROM, strength, pain, [place],  [facility], mobility, and endurance, cognitive skills including emotional, and psychosocial skills including coping strategies, environmental adaptation, and routines and behaviors.   IMPAIRMENTS are limiting patient from ADLs, IADLs, education, work, leisure, and social participation.   COMORBIDITIES may have co-morbidities  that affects occupational performance. Patient will benefit from skilled OT to address above impairments and improve overall function.  MODIFICATION OR ASSISTANCE TO COMPLETE EVALUATION: Maximum or significant modification of tasks or assist is necessary to complete an evaluation.  OT OCCUPATIONAL PROFILE AND HISTORY: Comprehensive assessment: Review of records and extensive additional review of physical, cognitive, psychosocial history related to current functional performance.  CLINICAL DECISION MAKING: High - multiple treatment options, significant modification of task necessary  REHAB POTENTIAL: Good  EVALUATION COMPLEXITY: High    PLAN: OT FREQUENCY: 1x/week  OT DURATION: 12 weeks  PLANNED INTERVENTIONS: self care/ADL training, therapeutic exercise, therapeutic activity, neuromuscular re-education, manual therapy, passive range of motion, and splinting, pt. Education was energy conservation/work simplification  RECOMMENDED OTHER SERVICES:   CONSULTED AND AGREED WITH PLAN OF CARE: Patient  PLAN FOR NEXT SESSION:  N/A; d/c this visit  Leta Speller, MS, OTR/L   Darleene Cleaver, OT 03/28/2022, 8:41 AM

## 2022-03-31 ENCOUNTER — Ambulatory Visit: Payer: Medicare HMO | Admitting: Internal Medicine

## 2022-03-31 ENCOUNTER — Encounter: Payer: Self-pay | Admitting: Internal Medicine

## 2022-03-31 VITALS — BP 124/70 | HR 77 | Temp 98.2°F | Ht 65.5 in | Wt 150.8 lb

## 2022-03-31 DIAGNOSIS — J441 Chronic obstructive pulmonary disease with (acute) exacerbation: Secondary | ICD-10-CM

## 2022-03-31 DIAGNOSIS — R0602 Shortness of breath: Secondary | ICD-10-CM

## 2022-03-31 DIAGNOSIS — J449 Chronic obstructive pulmonary disease, unspecified: Secondary | ICD-10-CM | POA: Diagnosis not present

## 2022-03-31 LAB — CBC WITH DIFFERENTIAL/PLATELET
Basophils Absolute: 0 10*3/uL (ref 0.0–0.1)
Basophils Relative: 0.4 % (ref 0.0–3.0)
Eosinophils Absolute: 0.1 10*3/uL (ref 0.0–0.7)
Eosinophils Relative: 1.2 % (ref 0.0–5.0)
HCT: 38.8 % (ref 36.0–46.0)
Hemoglobin: 12.9 g/dL (ref 12.0–15.0)
Lymphocytes Relative: 24.4 % (ref 12.0–46.0)
Lymphs Abs: 2.3 10*3/uL (ref 0.7–4.0)
MCHC: 33.3 g/dL (ref 30.0–36.0)
MCV: 95.4 fl (ref 78.0–100.0)
Monocytes Absolute: 0.6 10*3/uL (ref 0.1–1.0)
Monocytes Relative: 6.5 % (ref 3.0–12.0)
Neutro Abs: 6.4 10*3/uL (ref 1.4–7.7)
Neutrophils Relative %: 67.5 % (ref 43.0–77.0)
Platelets: 334 10*3/uL (ref 150.0–400.0)
RBC: 4.07 Mil/uL (ref 3.87–5.11)
RDW: 13.5 % (ref 11.5–15.5)
WBC: 9.5 10*3/uL (ref 4.0–10.5)

## 2022-03-31 LAB — BASIC METABOLIC PANEL
BUN: 10 mg/dL (ref 6–23)
CO2: 28 mEq/L (ref 19–32)
Calcium: 9.4 mg/dL (ref 8.4–10.5)
Chloride: 101 mEq/L (ref 96–112)
Creatinine, Ser: 0.69 mg/dL (ref 0.40–1.20)
GFR: 91.27 mL/min (ref >60.00)
Glucose, Bld: 89 mg/dL (ref 70–99)
Potassium: 3.8 mEq/L (ref 3.5–5.1)
Sodium: 138 mEq/L (ref 135–145)

## 2022-03-31 LAB — BRAIN NATRIURETIC PEPTIDE: Pro B Natriuretic peptide (BNP): 36 pg/mL (ref 0.0–100.0)

## 2022-03-31 MED ORDER — DOXYCYCLINE HYCLATE 100 MG PO TABS: 100.0000 mg | ORAL_TABLET | Freq: Two times a day (BID) | ORAL | 0 refills | Status: DC

## 2022-03-31 MED ORDER — PREDNISONE 10 MG PO TABS: ORAL_TABLET | ORAL | 0 refills | Status: AC

## 2022-03-31 NOTE — Progress Notes (Addendum)
Subjective:  Patient ID: Maria [name], female , DOB: December 17, 1955 , age 66 y.o. , MRN: [id number] , ADDRESS: [place] [id number]   03/19/2019 -   Chief Complaint  Patient presents with   Hospitalization Follow-up    Post covid symptoms   Post COVID  follow-up in the pulmonary office  HPI Maria [name] 66 y.o. 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  And also poor intake.  Was diagnosed to have acute COVID-19.  She remembers being on oxygen.  At the time prior to admission she is had chronic hip pain based on her history and review of the records for which she was using a cane.  She spent a total of 5 days at [facility] [place] [facility] and was discharged on March 03, 2019.  She was treated with oxygen, Solu-Medrol and REM does Advair.  Last set of laboratories reviewed on March 03, 2019 which showed that she had normal hemoglobin and creatinine and his CRP had improved.  The only chest x-ray that was available is 1 on admission in February 26, 2019 but she has some basal infiltrates.  She now presents to the pulmonary clinic on March 19, 2019.  It is 21 days since admission and she is over 21 days since her first set of symptoms.  She tells me that she is a lot more fatigue.  She is so fatigued that she is not even able to take her inhaler properly which is Trelegy for her COPD.  She feels short of breath all the time.  She is requiring a lot of help taking a shower.  Her hip pain continues.  She is now needing the use of a walker.  Her ECOG is 4.  Her appetite is good.  She is currently not using oxygen either at night or in the daytime.   Results for TAHIRIH, LAIR" (MRN [id number]) as of  08/22/2019 12:57  Ref. Range 03/01/2019 03:22 03/02/2019 01:55 03/03/2019 01:54  D-Dimer, America Brown Latest Ref Range: 0.00 - 0.50 ug/mL-FEU 1.34 (H) 1.12 (H) 1.28 (H)       04/03/2019 Presents today for a 2-week follow-up.  Echocardiogram showed normal systolic function with estimated EF 60 to 65%.  Left ventricle diastolic doppler consistent with impaired relaxation-determinant filling pressures.  Right ventricle with normal systolic function, increased wall thickness-systolic pressure could not be assessed.  HRCT is scheduled for 9/29.  Patient called on 9/4 asking about portable oxygen tank order.  Reports that her O2 level has read 84 to 85% after exertion with a heart rate of 110.  She reports that her oxygen level recovers greater than 90% after resting.  Is not currently on oxygen at home will need to be walked in office to qualify for POC. Repeat COVID testing was positive, patient reports that she still has lost her taste and smell with increased shortness of breath.   She is feeling a little better today. Family is checking on her. No other help at home. Reports feeling fine when sitting but her O2 drops as low as 85% on exertion. She is able to recover with rest. Taking prednisone taper as prescribed, feels it has helped her chest tightness. She has dry/hacking cough  with no mucus production. She is eating and drinking ok. Denies fever.   04/09/2019 Patient presents today for 1 week follow-up. Unfortunately we were unable to get her set up with home health d/t her insurance. She is here today for qualifying O2 walk. She continues to have some shortness of breath and dry cough. Low energy. Her taste and smell as returned some. She is drinking plenty of fluids. Reports that her cough is not quite as bad, states the more she talks she has to cough. Has trouble sleeping at night d/t her respiratory symptoms. Feels delsym and tessalon perles have been helpful. ONO on RA showed O2 low 70% with baseline 87%.  Ambulatory O2 today showed O2 desaturation 84% on RA, requiring 3L. Using [employer] for nebulizer supplies. Denies fever, weight loss, hemoptysis, purulent mucus.   04/22/2019 Patient presents today for 2 week follow-up. Breathing is the same. Continues to have a dry cough. Completed prednisone 2 days ago. Using incentive spirometry. She has tried to go without her oxygen at home but states that she gets too short winded. 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 Diffuse bronchial wall thickening with moderate centrilobular and paraseptal emphysema; imaging findings suggestive of underlying COPD. She had COPD symptoms before getting COVID. Her breathing was getting a little worse before her diagnosis.  Sleeping better at night with oxygen. Ambulating with rolling walker, still deconditioned/weak. Due for influenza vaccine today.   Oxygen readings:  Resting O2 93% on RA  Ambulatory O2 98-100 on 3L Ambulatory O2 85% on RA  OV 07/03/2019  Subjective:  Patient ID: Maria [name], female , DOB: 08-Sep-1955 , age 66 y.o. , MRN: [id number] , ADDRESS: [place] [id number]   07/03/2019 -   Chief Complaint  Patient presents with   Follow-up    PFT performed 12/7 and pt is here following up after that. Pt states she is still becoming SOB with exertion.  Follow-up emphysema Follow-up interstitial lung disease post COVID-19 in August 2020 (had elevatd d-dimer) Follow-up chronic hypoxemic respiratory failure secondary to both   HPI Maria [name] 66 y.o. -presents for the above issues.  She says her condition is returned to normal.  She is back working as a Forensic psychologist.  However she continues to be still significantly symptomatic.  She had a pulmonary function test June 24, 2019.  I visualized the result.  Shows isolated reduction in diffusion capacity  was only moderate at 57%.  FVC and FEV1 are normal.  She continues to be significantly symptomatic as documented below.  She is taking Trelegy.  She says she desaturates to 85% with exertion echocardiogram recently was normal.  She is asking if she should take the Moderna COVID-19 vaccine available in January 2021         IMPRESSION: CT chest 04/16/2019 1. 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 Given the presence of some bronchiectasis, this is technically characterized as probable UIP per current ATS guidelines, however, that is not favored. If there is persistent clinical concern for interstitial lung disease, repeat high-resolution chest CT would be recommended in 12 months to assess for temporal changes in the appearance of the lung parenchyma. 2. Diffuse bronchial wall thickening with moderate centrilobular and paraseptal emphysema;  imaging findings suggestive of underlying COPD. 3. Aortic atherosclerosis, in addition to right coronary artery disease. Please note that although the presence of coronary artery calcium documents the presence of coronary artery disease, the severity of this disease and any potential stenosis cannot be assessed on this non-gated CT examination. Assessment for potential risk factor modification, dietary therapy or pharmacologic therapy may be warranted, if clinically indicated.   Aortic Atherosclerosis (ICD10-I70.0) and Emphysema (ICD10-J43.9).     Electronically Signed   By: Vinnie Langton M.D.   On: 04/16/2019 13:12   OV 08/22/2019  Subjective:  Patient ID: Maria [name], female , DOB: 01-Aug-1955 , age 74 y.o. , MRN: [id number] , ADDRESS: [place] [id number]   08/22/2019 -   Chief Complaint  Patient presents with   Follow-up    History of 2019 Novel coronavirus disease (COVID-19)    Follow-up emphysema - alpha 1 MZ in dec 2020 Follow-up interstitial lung disease post COVID-19 in August 2020 Follow-up chronic hypoxemic respiratory failure secondary to both Ex smoker Echo sept 202 0 - ef 65% and diast dysfn  HPI Maria [name] 66 y.o. -presents for follow-up of the above issues.  In the interim she did have serologic work-up for her ILD that was discovered post Covid setting.  Her ANA and rheumatoid factor were trace positive.  Her ESR was 41.  Therefore we committed her to few to several week prednisone taper.  She says she is on a 5-week taper.  She is got 2 more weeks of this.  She says since her last visit she is feeling worse.  The worsening is in terms of shortness of breath.  Also has dizziness and floaters when she exerts.  In fact on a virtual rehab class she had to get on more oxygen.  Her dyspnea symptom scores are worse than before but cough and fatigue around the same.  She is requiring 3 L now with exertion.  Although in rehab the asked her to increase it to 6 L which helped but this was not documented because of worsening hypoxemia.  Today when we walked her she had to stop several times because of dizziness and fatigue although she did not desaturate.  Her lung function itself in December 2020 shows only isolated reduction in DLCO.   Of note she is alpha-1 MZ from her labs at last visit.    OV 04/29/2020   Subjective:  Patient ID: Maria [name], female , DOB: 1955-12-01, age 34 y.o. years. , MRN: [id number],  ADDRESS: [place] [address] PCP  [name], MD Providers : Treatment Team:  Attending Provider: Brand Males, MD Patient Care Team: [name], MD as PCP - General (Family Medicine)   Follow-up emphysema - alpha 1 MZ in dec 2020 Follow-up interstitial lung disease post COVID-19 in August 2020 -> feb 2021 Case conf ILD: NO ILD Follow-up chronic hypoxemic respiratory failure secondary to both Ex smoker Echo sept  202 0 - ef 65% and diast dysfn Last CT scan of the chest February 2021  Chief Complaint  Patient presents with   Follow-up    Pt states that she has had good days and bad days since last visit. Pt said that she is still having problems with SOB also has an occ cough.       HPI Maria [name] 66 y.o. -returns for follow-up.  Last seen by myself in February 2021.  After that she is seen  nurse practitioner.  Alpha-1 replacement was advised by nurse practitioner.  Initially denied by insurance but now apparently approved by insurance.  The still waiting for some of paperwork to be clear.  Currently on Trelegy.  She is on oxygen 3 L at rest 5 L with exertion.  She is fully vaccinated.  She is in need of Covid booster.  Overall doing well.  No flowsheet data found.        Simple office walk 185 feet x  3 laps goal with forehead probe 08/22/2019   O2 used 3L  Number laps completed Did only 2 of 3. Stopped 8 times and site down x 2. Got dizzy and floaters in eyes  Comments about pace Very slow pace  Resting Pulse Ox/HR 99% and 91/min  Final Pulse Ox/HR 99% during entire walk  Desaturated </= 88% x  Desaturated <= 3% points x  Got Tachycardic >/= 90/min yes  Symptoms at end of test As above  Miscellaneous comments Sub maximal exertion     Las\bs - alph 1 MZ in dec 2020 - autoimmune  - ANA 1:40, RF 23, and ESR 40s   PFT dec 2020  - isolated reduction in dlco 57%   OV 08/27/2020  Subjective:  Patient ID: Maria [name], female , DOB: July 10, 1956 , age 29 y.o. , MRN: [id number] , ADDRESS: [place] [id number] PCP [name], MD Patient Care Team: [name], MD as PCP - General (Family Medicine)  This Provider for this visit: Treatment Team:  Attending Provider: Brand Males, MD    08/27/2020 -   Chief Complaint  Patient presents with   Follow-up    Pt states she still has problems with SOB. States when the weather is colder her breathing  is worse. Pt also has complaints of cough which happens all throughout the day.    Follow-up emphysema - alpha 1 MZ in dec 2020 Follow-up interstitial lung disease post COVID-19 in August 2020 -> feb 2021 Case conf ILD: NO ILD Follow-up chronic hypoxemic respiratory failure secondary to both Ex smoker 40 pack quit Echo sept 202 0 - ef 65% and diast dysfn Last CT scan of the chest February 2021  HPI Maria [name] 66 y.o. -returns to see me for the above issues.  Since last visit she has had a Covid vaccine booster.  Overall she is stable but for the last few months has had worsening cough.  The cough is dry.  She continues with oxygen 3-5 L.  We will try to get her alpha-1 replacement but she got denied by insurance.  She showed me the insurance denial letters on August 10, 2020.  Apparently there was a hearing which she was asked to participate but they sent the note is late and on the same day they denied her appeal.  She feels this is all premeditated by the insurance company.  She is frustrated.  She continues with Trelegy and oxygen.  Review of the records indicate last CT scan of the chest was in February 2021.  She quit smoking less than 15 years ago.  She she is in need of annual CT scan now.  Other than the dry worsening cough she feels well.  There is no worsening wheezing or sputum.   CT Chest data  No results found.       OV 11/13/2020  Subjective:  Patient ID: Maria [name], female , DOB: 1956-05-07 , age 42 y.o. , MRN:  [id number] , ADDRESS: [place] [id number] PCP [name], MD Patient Care Team: [name], MD as PCP - General (Family Medicine)  This Provider for this visit: Treatment Team:  Attending Provider: Brand Males, MD    11/13/2020 -   Chief Complaint  Patient presents with   Follow-up    PFT performed 4/28.  Pt states her cough is worse since last visit and states she still becomes SOB easily.   Follow-up emphysema -  alpha 1 MZ in dec 2020  -- On Trelegy for few years as of 2022.  Dose increased early 2022  -Insurance denied alpha-1 replacement in 2020 07/2020 Follow-up interstitial lung disease post COVID-19 in August 2020 -> feb 2021 Case conf ILD: NO ILD Follow-up chronic hypoxemic respiratory failure secondary to both Ex smoker 40 pack quit Echo sept 202 0 - ef 65% and diast dysfn Last CT scan of the chest February 2021  Chronic coough since jan 2022  HPI Maria [name] 66 y.o. -returns for work-up.  I last saw her in February 2022.  At that time she was reporting worsening cough.  We decided to full work-up.  She had pulmonary function test that shows stability since 2020 although the DLCO is down [?  Technical issues, last hemoglobin 12.1 g% in 2021 May] her oxygen use is the same around 3 L at rest 5 L with exertion.  She uses Trelegy.  She is not on any antihypertensives.  Not on any acid reflux medication.  Not on fish oil.  Her smoking is in remission.  The cough wakes her up at night.  Cough severity is documented below.  It significantly high suggesting irritable larynx syndrome/cough neuropathy.  She had CT scan of the chest that only shows emphysema.  No fibrosis or pneumonia or nodules or cancer.  She had an arterial blood gas and there is no hypercapnia.  She is frustrated by her quality of life with excessive cough.  The cough is driving most of the high COPD CAT score of 35.    Dr Lorenza Cambridge Reflux Symptom Index (> 13-15 suggestive of LPR cough) 0 -> 5  =  none ->severe problem 11/13/2020   Hoarseness of problem with voice 1  Clearing  Of Throat 1  Excess throat mucus or feeling of post nasal drip 2  Difficulty swallowing food, liquid or tablets 0  Cough after eating or lying down 4.5  Breathing difficulties or choking episodes 3.5  Troublesome or annoying cough 4  Sensation of something sticking in throat or lump in throat 3  Heartburn, chest pain, indigestion, or stomach acid coming up  0  TOTAL 19    CAT Score 11/13/2020  Total CAT Score 35         ABG Feb 2022  Results for JAMEISHA, STOFKO "CINDY" (MRN [id number]) as of 11/13/2020 09:07  Ref. Range 08/31/2020 13:35  Sample type Unknown ARTERIAL DRAW  FIO2 Unknown 32.00  pH, Arterial Latest Ref Range: 7.350 - 7.450  7.417  pCO2 arterial Latest Ref Range: 32.0 - 48.0 mmHg 37.3  pO2, Arterial Latest Ref Range: 83.0 - 108.0 mmHg 87.3   CT Chest data feb 2022   IMPRESSION: 1.  No acute process in the chest. 2. Moderate centrilobular and paraseptal emphysema. No other explanation for patient's symptoms. Emphysema (ICD10-J43.9). 3.  Aortic Atherosclerosis (ICD10-I70.0).     Electronically Signed   By: Abigail Miyamoto M.D.   On: 09/04/2020 09:01  12/11/2020 Follow up : COPD with emphysema, oxygen dependent respiratory failure, chronic cough Patient returns for a 1 month follow-up.  Patient has underlying moderate COPD with emphysema.  She also has alpha-1 phenotype MZ with a normal level. She is maintained on oxygen 3 L at rest and 5 L with activity. Patient has been dealing with an ongoing chronic cough since COVID-19 infection in August  2020.  Cough is persistent and feels getting progressively worse. Cough is very frustrating with cough associated urinary incontinence .  High resolution CT chest in 2021 and repeat CT chest February 2022 showed no evidence of interstitial lung disease.  Moderate emphysema. Patient has been recommended on cough control regimen.  Last visit she was started on gabapentin 300 mg twice daily.  She was also changed from Trelegy to [facility] in case the dry powder inhaler was aggravating her upper airway Patient says her cough is no better , unchanged .  Using Halls cough drops.  Has been tried on steroid tapers in the past without any improvement.  Is not using any cough syrups.  Denies any fever, discolored mucus, hemoptysis, chest pain cough is all through the day and night . Sometimes  worse at night     OV 01/19/2021  Subjective:  Patient ID: Maria [name], female , DOB: [birth year] , age 10 y.o. , MRN: [id number] , ADDRESS: [address] [place] [facility] [id number] PCP [name], MD Patient Care Team: [name], MD as PCP - General (Family Medicine)  This Provider for this visit: Treatment Team:  Attending Provider: Brand Males, MD  Follow-up emphysema - alpha 1 MZ in dec 2020  -- On Trelegy for few years as of 2022.  Dose increased early 2022  -Insurance denied alpha-1 replacement in 2020 07/2020 Follow-up interstitial lung disease post COVID-19 in August 2020 -> feb 2021 Case conf ILD: NO ILD Follow-up chronic hypoxemic respiratory failure secondary to both Ex smoker 40 pack quit Echo sept 202 0 - ef 65% and diast dysfn Last CT scan of the chest February 2022  Chronic coough since jan 2022  01/19/2021 -   Chief Complaint  Patient presents with   Follow-up    Pt states she still has complaints of coughing which is about the same since last visit. States she is coughing up clear thick phlegm. States SOB is worse when she goes outside in the humidity but is fine if she stays inside.     HPI Maria [name] 66 y.o. -returns for follow-up of her COPD and severe chronic cough.  She continues to use 3 L of oxygen.  She says without oxygen at rest she starts getting dizzy.  However after return the oxygen off her pulse ox was 92% on room air at rest 10 minutes later.  Overall she feels she is stable but she continues to have significant amount of cough that she rates as moderate.  Wakes her up at night.  There is no associated wheezing.  She does clear her throat and has a laryngeal quality to the cough.  Last visit nurse practitioner added Chlor-Trimeton and Tessalon and Robitussin but despite this cough persist.  She continues on gabapentin 3 mg twice daily.  She has never been to voice rehabilitation.  She is not sure if prednisone has helped her cough  but she knows prednisone makes her irritable but she is willing to give it a short-term empiric trial    CAT Score 12/11/2020 11/13/2020  Total CAT  Score 18 35     IMPRESSION: 1.  No acute process in the chest. 2. Moderate centrilobular and paraseptal emphysema. No other explanation for patient's symptoms. Emphysema (ICD10-J43.9). 3.  Aortic Atherosclerosis (ICD10-I70.0).     Electronically Signed   By: Abigail Miyamoto M.D.   On: 09/04/2020 09:18 Jul 2021   07/28/2021 Follow up : COPD with emphysema, oxygen dependent respiratory failure, chronic cough Patient returns for 60-monthfollow-up.  Patient remains on Breztri inhaler twice daily.  She continues to have ongoing chronic cough despite aggressive cough control regimen.  She is on gabapentin 300 mg 3 times daily, Robitussin DM every 4 hours, Tessalon Perles and Chlor-Trimeton.  Along with reflux diet and PPI. Ran out of Duoneb 2 weeks ago. Typically uses Twice daily . Notices difference in breathing without it.  She remains on oxygen 3 L at rest and 5 L with activity.Uses POC when out from home. Really helps.  Overall feels she is doing some better. Still has cough but not quite is bad. Does have some tightness in chest and back on right side at times. No chest pain, palpitations,  Flu shot is utd. PVX is utd.   Lives at home with husband. Drives. Brother has COPD on O2 . Husband works fulltime Adult kOptician, dispensing 6 grandkids.  Stays active, light housework, shopping.   Patient's insurance will not cover Protonix 20 mg twice daily.  Will only cover Protonix 40 mg.  We discussed changing her prescription over She denies any increased reflux symptoms  OV 10/20/2021  Subjective:  Patient ID: Maria [name] female , DOB: 8November 08, 1957, age 66y.o. , MRN: [id number], [place] [id number] CLesleigh Noe MD Patient Care Team: CLesleigh Noe MD as PCP - General (Family Medicine)  This Provider for this  visit: Treatment Team:  Attending Provider: RBrand Males MD    10/20/2021 -   Chief Complaint  Patient presents with   Follow-up    2 mo f/u for COPD. Wants to switch from [employer] to [place] her O[place]. 3L at rest, 5L w/exertion.    Follow-up emphysema - alpha 1 MZ in dec 2020  -- On Trelegy for few years as of 2022.  Dose increased early 2022  -Insurance denied alpha-1 replacement in 2020 07/2020 Follow-up interstitial lung disease post COVID-19 in August 2020 -> feb 2021 Case conf ILD: NO ILD Follow-up chronic hypoxemic respiratory failure secondary to both Ex smoker 40 pack quit Echo sept 202 0 - ef 65% and diast dysfn Last CT scan of the chest February 2022  Chronic coough since jan 2022 -on gabapentin   HPI CMIRABEL AHLGREN89y.o. -returns for follow-up.  Her COPD CAT score is actually better than previous 2022 but similar to May 2022.  Nevertheless she is on 3 L oxygen at rest and 5 L with exertion.  She states in the last 6 months has been on prednisone "quite a few times" for COPD exacerbation and antibiotics at least 2 times.  But currently she is feeling stable.  She does have chronic cough for which she is on a bunch of medications.  She wants to qualify for oxygen.  She is having insurance coverage issues.  She is waiting till she turns 89in August 2023 before she can enroll in Medicare.  She is interested in joining the [facility] insurance program with Medicare.  Her last CT scan of the chest was a  year ago.  We discussed the prevention of COPD exacerbations.  We discussed Roflumilast.  She is interested in this.  We also discussed the potential insurance approval with Dupixent.  She is willing to get a CBC with differential checked.  We discussed upcoming potential clinical trials such as the one being sponsored by [place] which might be available in the future.  She is interested in that if she qualifies.  At this point in time she is okay starting  Daliresp.  She was sitting on 3 L oxygen.  I turned this off to get a qualifying walk test done.       07/28/2021   10:24 AM 12/11/2020    9:24 AM 11/13/2020    8:49 AM  CAT Score  Total CAT Score 19 18 35     12/15/2021 Follow up : COPD with emphysema, oxygen dependent respiratory failure Patient presents for a 5-monthfollow-up and preop surgical risk assessment.  Patient has underlying moderate COPD with emphysema.  She is oxygen dependent she says overall she has been doing well with her breathing.  She still gets short of breath with minimal activities.  Has had no flare of her breathing recently.  No recent antibiotics or steroid use.  Patient was seen early last month.  She was set up for an ABG that showed no significant hypercarbia.  She has been set up for CT chest that is pending, routine as she has a heavy smoking history.  Also Daliresp was added into her maintenance regimen.  She remains on Breztri twice daily. Spirometry today in the office shows stable lung function with FEV1 at 66%, ratio 88, FVC 59%.  [place] [facility] [place], Dr. MSabra Heck. Has degenerative cervical spinal stenosis. Has chronic neck pain.  Has hard time dressing due to neck and left arm pain. Becoming hard to use left arm due to weakness.   Lives at home with spouse. Able to drive. Can do very light house chores and cooking . Can only walk short distances and has to rest. Gets winded easily .   Remains on Oxygen 3l/m at rest and 5l/m with activity .  She denies any increased oxygen demands.    Was seen 02/15/22 by PCP with aecopd rx zpak and pred > no better  Dyspnea:  room to room but comfortable at rest / no cp  Cough: yellowish worse than usual x 02/12/22 very congested sounding / assoc nasal congestion  SABA use: nebulizer not helping   baseline using 3 x daily  /  5-6 x daily since onset of flare 02 turned up to 4lpm POC sats low 90s   Rx  prednisone  OV 03/31/2022  Subjective:  Patient ID: Maria [name] female , DOB: 808/30/1957, age 66y.o. , MRN: [id number], [id number] [id number] CLesleigh Noe MD Patient Care Team: CLesleigh Noe MD as PCP - General (Family Medicine)  This Provider for this visit: Treatment Team:  Attending Provider: RBrand Males MD   Follow-up emphysema - alpha 1 MZ in dec 2020  -- On Trelegy for few years as of 2022.  Dose increased early 2022  -Insurance denied alpha-1 replacement in 2020 07/2020 Follow-up interstitial lung disease post COVID-19 in August 2020 -> feb 2021 Case conf ILD: NO ILD Follow-up chronic hypoxemic respiratory failure secondary to cp[d Ex smoker 40 pack quit Echo sept 202 0 - ef 65% and diast  dysfn Last CT scan of the chest February 2022  Chronic coough since jan 2022 -on gabapentin  03/31/2022 -   Chief Complaint  Patient presents with   Follow-up    Pt states that she is still having problems with coughing. States sometimes when she coughs it is clear but will also have a yellow tint to it. Also has some wheezing associated.     HPI Maria [name] 66 y.o. -Marlane Mingle entry] she was seen early August for COPD exacerbation.  She is alpha-1 MZ.  She has quit smoking but still she is not feeling better.  She is having coughing occasional sputum and wheezing.  Symptoms seem worse than baseline and consistent with a COPD flareup.  She is frustrated with repeated flareups.  In 2020 when she was able to walk 3 L and this was adequate.  Currently using 3 L at rest and 5 L with exertion although it is unclear if it is based on subjective needs.  At follow-up we will check walking desaturation test on 3 and 4 L.  Her blood eosinophils have been normal in the past.    CT Chest data sept 022  IMPRESSION: 1. No acute cardiopulmonary process. 2. Aortic Atherosclerosis (ICD10-I70.0) and Emphysema (ICD10-J43.9).     Electronically Signed    By: Ronney Asters M.D.   On: 03/27/2022 21:05  No results found.   Latest Reference Range & Units 10/02/08 22:05 11/09/16 22:06 02/27/19 03:45 02/28/19 01:15 03/01/19 03:22 03/02/19 01:55 03/03/19 01:54 03/19/19 09:56 11/18/19 13:26 10/20/21 09:53  Eosinophils Absolute 0.0 - 0.7 K/uL 0.1 0.2 0.0 0.0 0.0 0.0 0.0 0.1 0.2 0.1    PFT     Latest Ref Rng & Units 11/12/2020   10:43 AM 01/16/2020   10:57 AM 06/24/2019    8:45 AM  PFT Results  FVC-Pre L 2.94  3.17  2.93   FVC-Predicted Pre % 84  90  83   FVC-Post L 3.18  3.25  3.39   FVC-Predicted Post % 91  92  96   Pre FEV1/FVC % % 54  55  56   Post FEV1/FCV % % 54  56  59   FEV1-Pre L 1.60  1.75  1.64   FEV1-Predicted Pre % 59  65  60   FEV1-Post L 1.71  1.83  1.99   DLCO uncorrected ml/min/mmHg 8.06  10.81  12.24   DLCO UNC% % 37  50  57   DLCO corrected ml/min/mmHg 8.06  11.29    DLCO COR %Predicted % 37  52    DLVA Predicted % 57  61  60   TLC L 4.89  5.25  5.81   TLC % Predicted % 91  98  108   RV % Predicted % 88  86  110     Latest Reference Range & Units 10/02/08 22:05 11/09/16 22:06 02/27/19 03:45 02/28/19 01:15 03/01/19 03:22 03/02/19 01:55 03/03/19 01:54 03/19/19 09:56 11/18/19 13:26 10/20/21 09:53  Eosinophils Absolute 0.0 - 0.7 K/uL 0.1 0.2 0.0 0.0 0.0 0.0 0.0 0.1 0.2 0.1      has a past medical history of Atherosclerosis (1/0612014), Blood transfusion without reported diagnosis (2005), Cancer ([place]) (Ovarian 1978), Chest pain, COPD (chronic obstructive pulmonary disease) ([place]), COVID-19 long hauler manifesting chronic dyspnea, Emphysema of lung ([place]) (2019), Oxygen deficiency (03/2019), and Oxygen dependent.   reports that she quit smoking about 6 years ago. Her smoking use included cigarettes. She has a 45.00 pack-year smoking history. She has  never used smokeless tobacco.  Past Surgical History:  Procedure Laterality Date   ABDOMINAL HYSTERECTOMY  1978   CHOLECYSTECTOMY  2010   COLONOSCOPY     COLONOSCOPY      COLONOSCOPY WITH PROPOFOL N/A 11/01/2021   Procedure: COLONOSCOPY WITH PROPOFOL;  Surgeon: Jonathon Bellows, MD;  Location: [facility] ENDOSCOPY;  Service: Gastroenterology;  Laterality: N/A;   ESOPHAGOGASTRODUODENOSCOPY     NECK SURGERY  1999   OVARIAN CYST REMOVAL  1988   SPINE SURGERY  Neck 2002    Allergies  Allergen Reactions   Montelukast     Bad dreams    Immunization History  Administered Date(s) Administered   Influenza, Seasonal, Injecte, Preservative Fre 04/28/2008, 04/09/2010   Influenza,inj,Quad PF,6+ Mos 04/22/2019, 04/25/2020, 03/15/2021   Influenza-Unspecified 06/01/2008, 05/10/2016, 03/18/2017   Moderna Sars-Covid-2 Vaccination 07/30/2019, 08/28/2019, 06/27/2020   Pneumococcal Polysaccharide-23 04/15/2015, 04/22/2020   Td 10/01/2020   Tdap 11/17/2009   Zoster Recombinat (Shingrix) 04/16/2020, 06/22/2020    Family History  Problem Relation Age of Onset   Heart failure Mother    Diabetes Mother    Kidney disease Mother    Hypertension Mother    Arthritis Mother    Heart disease Mother    Obesity Mother    COPD Father    Hearing loss Father    Breast cancer Maternal Aunt    Lung cancer Brother    COPD Brother    COPD Brother    Asthma Son    Diabetes Son    Birth defects Son      Current Outpatient Medications:    albuterol (VENTOLIN HFA) 108 (90 Base) MCG/ACT inhaler, TAKE 2 PUFFS BY MOUTH EVERY 6 HOURS AS NEEDED FOR WHEEZE OR SHORTNESS OF BREATH, Disp: 8.5 each, Rfl: 5   ASPIRIN LOW DOSE 81 MG EC tablet, TAKE 1 TABLET BY MOUTH EVERY DAY, Disp: 90 tablet, Rfl: 3   atorvastatin (LIPITOR) 10 MG tablet, TAKE 1 TABLET BY MOUTH EVERY DAY, Disp: 90 tablet, Rfl: 2   Azelastine HCl 137 MCG/SPRAY SOLN, USE 1 SPRAY INTO EACH NOSTRIL TWICE A DAY, Disp: 30 mL, Rfl: 1   Budeson-Glycopyrrol-Formoterol (BREZTRI AEROSPHERE) 160-9-4.8 MCG/ACT AERO, Inhale 2 puffs into the lungs in the morning and at bedtime., Disp: 10.7 g, Rfl: 11   escitalopram (LEXAPRO) 10 MG tablet, Take 10  mg (1 pill) for 1 week. Then take 20 mg (2 pills) daily., Disp: 60 tablet, Rfl: 0   gabapentin (NEURONTIN) 300 MG capsule, Take 1 capsule (300 mg total) by mouth 3 (three) times daily., Disp: 270 capsule, Rfl: 1   ipratropium-albuterol (DUONEB) 0.5-2.5 (3) MG/3ML SOLN, USE 1 AMPULE IN NEBULIZER 4 TIMES A DAY AS NEEDED (DX: J84.112, J44.9), Disp: 360 mL, Rfl: 5   oxybutynin (DITROPAN-XL) 10 MG 24 hr tablet, Take 10 mg by mouth daily., Disp: , Rfl:    OXYGEN, Inhale into the lungs. 3 liters at rest and 5 liters with any activity.-, Disp: , Rfl:    pantoprazole (PROTONIX) 40 MG tablet, Take 1 tablet (40 mg total) by mouth daily., Disp: 30 tablet, Rfl: 11   Respiratory Therapy Supplies (FLUTTER) DEVI, Use as directed, Disp: 1 each, Rfl: 0   roflumilast (DALIRESP) 500 MCG TABS tablet, Take 1 tablet (500 mcg total) by mouth daily., Disp: 30 tablet, Rfl: 11      Objective:   Vitals:   03/31/22 0942  BP: 124/70  Pulse: 77  Temp: 98.2 F (36.8 C)  TempSrc: Oral  SpO2: 99%  Weight: 150 lb  12.8 oz (68.4 kg)  Height: 5' 5.5" (1.664 m)    Estimated body mass index is 24.71 kg/m as calculated from the following:   Height as of this encounter: 5' 5.5" (1.664 m).   Weight as of this encounter: 150 lb 12.8 oz (68.4 kg).  _0 @  [facility] Weights   03/31/22 0942  Weight: 150 lb 12.8 oz (68.4 kg)     Physical Exam    General: No distress. Coughing. MAsk on Neuro: Alert and Oriented x 3. GCS 15. Speech normal Psych: Pleasant Resp:  Barrel Chest - yes.  Wheeze - no, Crackles - no, No overt respiratory distress. Mild barrell chest.  CVS: Normal heart sounds. Murmurs - no Ext: Stigmata of Connective Tissue Disease - no HEENT: Normal upper airway. PEERL +. No post nasal drip        Assessment:       ICD-10-CM   1. COPD exacerbation ([place])  J44.1     2. COPD, frequent exacerbations ([place])  J44.1     3. Stage 2 moderate COPD by GOLD classification ([place])  J44.9          Plan:      Patient Instructions     ICD-10-CM   1. COPD exacerbation ([place])  J44.1     2. COPD, frequent exacerbations ([place])  J44.1     3. Stage 2 moderate COPD by GOLD classification ([place])  J44.9       You are in the midst of a COPD flareup or persistent flareup  This is happening despite normal blood eosinophils, not smoking, taking inhalers correctly and taking Daliresp correctly and CT scan without any pneumonia or cancer  Plan -Check CBC with differential, chemistry, BNP, QuantiFERON gold - Check echocardiogram -Check sputum for Gram stain and culture -Take doxycycline 161m po twice daily x 5 days; take after meals and avoid sunlight -Please take Take prednisone 471monce daily x 3 days, then 3030mnce daily x 3 days, then 21m41mce daily x 3 days, then prednisone 10mg38me daily  x 3 days and stop -Take information sheet for ARNASA research study  -We will see if you qualify -Continue oxygen 3 L at rest -Continue inhaler and Roflumilast  Follow-up - 4 weeks with nurse practitioner or Dr. RamasChase Callereview results    SIGNATURE    Dr. MuralBrand Males., F.C.C.P,  Pulmonary and Critical Care Medicine Staff Physician, [facility] - Interstitial Lung Disease  Program  Pulmonary [address] [id number]: [phone] answer or between  15:00h - 7:00h: call [phone] Telephone: [phone]  10:04 AM 03/31/2022

## 2022-03-31 NOTE — Patient Instructions (Addendum)
ICD-10-CM   1. COPD exacerbation ([place])  J44.1     2. COPD, frequent exacerbations ([place])  J44.1     3. Stage 2 moderate COPD by GOLD classification ([place])  J44.9       You are in the midst of a COPD flareup or persistent flareup  This is happening despite normal blood eosinophils, not smoking, taking inhalers correctly and taking Daliresp correctly and CT scan without any pneumonia or cancer  Plan -Check CBC with differential, chemistry, BNP, QuantiFERON gold - Check echocardiogram -Check sputum for Gram stain and culture -Take doxycycline '100mg'$  po twice daily x 5 days; take after meals and avoid sunlight -Please take Take prednisone '40mg'$  once daily x 3 days, then '30mg'$  once daily x 3 days, then '20mg'$  once daily x 3 days, then prednisone '10mg'$  once daily  x 3 days and stop -Take information sheet for ARNASA research study  -We will see if you qualify -Continue oxygen 3 L at rest -Continue inhaler and Roflumilast  Follow-up - 4 weeks with nurse practitioner or [name] to review results

## 2022-04-04 ENCOUNTER — Ambulatory Visit: Payer: Medicare HMO

## 2022-04-04 ENCOUNTER — Other Ambulatory Visit: Payer: Medicare HMO

## 2022-04-04 DIAGNOSIS — J441 Chronic obstructive pulmonary disease with (acute) exacerbation: Secondary | ICD-10-CM | POA: Diagnosis not present

## 2022-04-05 LAB — QUANTIFERON-TB GOLD PLUS
Mitogen-NIL: 7.4 IU/mL
NIL: 0.03 IU/mL
QuantiFERON-TB Gold Plus: NEGATIVE
TB1-NIL: 0.02 IU/mL
TB2-NIL: 0.02 IU/mL

## 2022-04-06 ENCOUNTER — Encounter: Payer: Self-pay | Admitting: Internal Medicine

## 2022-04-06 ENCOUNTER — Encounter: Payer: Self-pay | Admitting: Family Medicine

## 2022-04-06 DIAGNOSIS — F320 Major depressive disorder, single episode, mild: Secondary | ICD-10-CM

## 2022-04-06 MED ORDER — ESCITALOPRAM OXALATE 20 MG PO TABS: 20.0000 mg | ORAL_TABLET | Freq: Every day | ORAL | 0 refills | Status: DC

## 2022-04-07 ENCOUNTER — Ambulatory Visit
Admission: RE | Admit: 2022-04-07 | Discharge: 2022-04-07 | Disposition: A | Discharge status: Home / Self Care | Payer: Medicare HMO | Source: Ambulatory Visit | Attending: Student in an Organized Health Care Education/Training Program | Admitting: Student in an Organized Health Care Education/Training Program

## 2022-04-07 ENCOUNTER — Ambulatory Visit: Payer: Medicare HMO | Admitting: Student in an Organized Health Care Education/Training Program

## 2022-04-07 ENCOUNTER — Encounter: Payer: Self-pay | Admitting: Student in an Organized Health Care Education/Training Program

## 2022-04-07 VITALS — BP 161/69 | HR 68 | Temp 97.2°F | Resp 18 | Ht 65.0 in | Wt 150.8 lb

## 2022-04-07 DIAGNOSIS — Q761 Klippel-Feil syndrome: Secondary | ICD-10-CM | POA: Insufficient documentation

## 2022-04-07 DIAGNOSIS — M792 Neuralgia and neuritis, unspecified: Secondary | ICD-10-CM | POA: Insufficient documentation

## 2022-04-07 DIAGNOSIS — G894 Chronic pain syndrome: Secondary | ICD-10-CM | POA: Diagnosis present

## 2022-04-07 DIAGNOSIS — M5010 Cervical disc disorder with radiculopathy, unspecified cervical region: Secondary | ICD-10-CM

## 2022-04-07 DIAGNOSIS — Z9981 Dependence on supplemental oxygen: Secondary | ICD-10-CM | POA: Insufficient documentation

## 2022-04-07 DIAGNOSIS — M542 Cervicalgia: Secondary | ICD-10-CM | POA: Diagnosis not present

## 2022-04-07 DIAGNOSIS — G8929 Other chronic pain: Secondary | ICD-10-CM | POA: Diagnosis present

## 2022-04-07 DIAGNOSIS — Z9889 Other specified postprocedural states: Secondary | ICD-10-CM

## 2022-04-07 DIAGNOSIS — M25511 Pain in right shoulder: Secondary | ICD-10-CM | POA: Insufficient documentation

## 2022-04-07 DIAGNOSIS — M4312 Spondylolisthesis, cervical region: Secondary | ICD-10-CM | POA: Diagnosis not present

## 2022-04-07 DIAGNOSIS — J441 Chronic obstructive pulmonary disease with (acute) exacerbation: Secondary | ICD-10-CM

## 2022-04-07 DIAGNOSIS — M19012 Primary osteoarthritis, left shoulder: Secondary | ICD-10-CM | POA: Diagnosis not present

## 2022-04-07 DIAGNOSIS — M19011 Primary osteoarthritis, right shoulder: Secondary | ICD-10-CM | POA: Diagnosis not present

## 2022-04-07 LAB — RESPIRATORY CULTURE OR RESPIRATORY AND SPUTUM CULTURE
MICRO NUMBER:: 13931658
SPECIMEN QUALITY:: ADEQUATE

## 2022-04-07 LAB — GRAM STAIN W/SPUTUM CULT RFLX

## 2022-04-07 LAB — SPUTUM CULTURE

## 2022-04-07 MED ORDER — GABAPENTIN 600 MG PO TABS: 600.0000 mg | ORAL_TABLET | Freq: Three times a day (TID) | ORAL | 0 refills | Status: DC

## 2022-04-07 MED ORDER — GABAPENTIN 400 MG PO CAPS: 400.0000 mg | ORAL_CAPSULE | Freq: Three times a day (TID) | ORAL | 0 refills | Status: DC

## 2022-04-07 NOTE — Telephone Encounter (Signed)
The culture did not grow anything.  This suggests there is no unusual bacterial colonization causing frequent flareups.  It also means that the reason for frequent flareups is still somewhat unexplained  Currently no change in plan from when she saw me in the office

## 2022-04-07 NOTE — Progress Notes (Signed)
Safety precautions to be maintained throughout the outpatient stay will include: orient to surroundings, keep bed in low position, maintain call bell within reach at all times, provide assistance with transfer out of bed and ambulation.  

## 2022-04-07 NOTE — Progress Notes (Signed)
Patient: Maria [name]  Service Category: E/M  Provider: Gillis Santa, MD  DOB: 03/09/1956  DOS: 04/07/2022  Referring Provider: [name], MD  MRN: [id number]  Setting: Ambulatory outpatient  PCP: [name], MD  Type: New Patient  Specialty: Interventional Pain Management    Location: Office  Delivery: Face-to-face     Primary Reason(s) for Visit: Encounter for initial evaluation of one or more chronic problems (new to examiner) potentially causing chronic pain, and posing a threat to normal musculoskeletal function. (Level of risk: High) CC: Neck Pain (RIGHT side worse)  HPI  Maria [name] is a 66 y.o. year old, female patient, who comes for the first time to our practice referred by [name], MD for our initial evaluation of her chronic pain. She has COVID-19 virus infection; Stage 2 moderate COPD by GOLD classification ([place]); Syncope; Orthostatic hypotension; Pneumonia due to COVID-19 virus; Chronic respiratory failure with hypoxia ([place]); Bronchiectasis without complication ([place]); Cheilitis; Other microscopic hematuria; Emphysema due to alpha-1-antitrypsin deficiency ([place]); Prediabetes; Tear of left gluteus minimus tendon; Aortic atherosclerosis ([place]); Chronic cough; Stress incontinence; Urinary frequency; Pulsatile tinnitus of left ear; Right foot pain; Carotid stenosis; Hyperlipidemia; Anxiety; Benign neoplasm of parathyroid gland; Bunion; Chronic bronchitis ([place]); Chronic constipation; Depressive disorder; Enthesopathy of ankle and tarsus; Gout; History of DVT (deep vein thrombosis); Left buttock pain; Smoking history; Numbness and tingling in both hands; Chronic right shoulder pain; History of cervical spinal surgery; Radicular pain in right arm; Current mild episode of major depressive disorder without prior episode ([place]); COPD exacerbation ([place]); Preop pulmonary/respiratory exam; Atherosclerosis of native arteries of extremity with intermittent claudication ([place]); Acute on chronic  respiratory failure with hypoxemia ([place]); Cervical fusion syndrome (C6-7); Cervical disc disorder with radiculopathy of cervical region (right); Requires continuous at home supplemental oxygen (3L)  COPD; and Chronic pain syndrome on their problem list. Today she comes in for evaluation of her Neck Pain (RIGHT side worse)  Pain Assessment: Location: Right Neck Radiating: down right arm to fingertips Onset: More than a month ago Duration: Chronic pain Quality: Burning, Numbness, Tingling, Other (Comment) ("feels like a ball of fire going down my (right) arm") Severity: 8 /10 (subjective, self-reported pain score)  Effect on ADL: limits ROM of head/neck Timing: Constant Modifying factors: nothing BP: (!) 161/69  HR: 68  Onset and Duration: Gradual Cause of pain: Unknown Severity: Getting worse, NAS-11 at its worse: 9/10, NAS-11 at its best: 8/10, NAS-11 now: 9/10, and NAS-11 on the average: 9/10 Timing: Not influenced by the time of the day Aggravating Factors: Motion Alleviating Factors: Hot packs Associated Problems: Depression, Numbness, Tingling, Weakness, and Pain that wakes patient up Quality of Pain: Disabling, Sharp, Shooting, Throbbing, Tingling, and Uncomfortable Previous Examinations or Tests: Bone scan, MRI scan, X-rays, Neurological evaluation, and Neurosurgical evaluation Previous Treatments: Trigger point injections and The patient denies OT  Maria [name] is a pleasant 66 year old female with a history of previous cervical spinal fusion who presents with right arm pain related to cervical radiculopathy from adjacent segment disease.  She describes severe difficulty in cervical lateral motion and cervical extension.  She is on home oxygen.  She has COPD.  She is on gabapentin 300 mg 3 times a day.  She would like to avoid any opioid analgesics or sedative medications.  She has had trigger point injections in the past as well as physical therapy.  She has tried to do home physical  therapy exercises but they have proved too difficult and have increased her  pain.  MRI findings are below.  She also has a history of depression.  Meds   Current Outpatient Medications:    albuterol (VENTOLIN HFA) 108 (90 Base) MCG/ACT inhaler, TAKE 2 PUFFS BY MOUTH EVERY 6 HOURS AS NEEDED FOR WHEEZE OR SHORTNESS OF BREATH, Disp: 8.5 each, Rfl: 5   ASPIRIN LOW DOSE 81 MG EC tablet, TAKE 1 TABLET BY MOUTH EVERY DAY, Disp: 90 tablet, Rfl: 3   atorvastatin (LIPITOR) 10 MG tablet, TAKE 1 TABLET BY MOUTH EVERY DAY, Disp: 90 tablet, Rfl: 2   Azelastine HCl 137 MCG/SPRAY SOLN, USE 1 SPRAY INTO EACH NOSTRIL TWICE A DAY, Disp: 30 mL, Rfl: 1   Budeson-Glycopyrrol-Formoterol (BREZTRI AEROSPHERE) 160-9-4.8 MCG/ACT AERO, Inhale 2 puffs into the lungs in the morning and at bedtime., Disp: 10.7 g, Rfl: 11   doxycycline (VIBRA-TABS) 100 MG tablet, Take 1 tablet (100 mg total) by mouth 2 (two) times daily., Disp: 10 tablet, Rfl: 0   escitalopram (LEXAPRO) 20 MG tablet, Take 1 tablet (20 mg total) by mouth daily. Take 10 mg (1 pill) for 1 week. Then take 20 mg (2 pills) daily., Disp: 90 tablet, Rfl: 0   gabapentin (NEURONTIN) 400 MG capsule, Take 1 capsule (400 mg total) by mouth 3 (three) times daily., Disp: 90 capsule, Rfl: 0   [START ON 05/07/2022] gabapentin (NEURONTIN) 600 MG tablet, Take 1 tablet (600 mg total) by mouth 3 (three) times daily., Disp: 90 tablet, Rfl: 0   ipratropium-albuterol (DUONEB) 0.5-2.5 (3) MG/3ML SOLN, USE 1 AMPULE IN NEBULIZER 4 TIMES A DAY AS NEEDED (DX: J84.112, J44.9), Disp: 360 mL, Rfl: 5   oxybutynin (DITROPAN-XL) 10 MG 24 hr tablet, Take 10 mg by mouth daily., Disp: , Rfl:    OXYGEN, Inhale into the lungs. 3 liters at rest and 5 liters with any activity.-, Disp: , Rfl:    pantoprazole (PROTONIX) 40 MG tablet, Take 1 tablet (40 mg total) by mouth daily., Disp: 30 tablet, Rfl: 11   predniSONE (DELTASONE) 10 MG tablet, Take 4 tablets (40 mg total) by mouth daily with breakfast for 3  days, THEN 3 tablets (30 mg total) daily with breakfast for 3 days, THEN 2 tablets (20 mg total) daily with breakfast for 3 days, THEN 1 tablet (10 mg total) daily with breakfast for 3 days., Disp: 30 tablet, Rfl: 0   Respiratory Therapy Supplies (FLUTTER) DEVI, Use as directed, Disp: 1 each, Rfl: 0   roflumilast (DALIRESP) 500 MCG TABS tablet, Take 1 tablet (500 mcg total) by mouth daily., Disp: 30 tablet, Rfl: 11  Imaging Review  Cervical Imaging: Cervical MR wo contrast: Results for orders placed during the hospital encounter of 08/19/21  MR Cervical Spine Wo Contrast  Narrative CLINICAL DATA:  Neck pain going to right arm with numbness in fingers for 1 month, history of cervical spine surgery 20 years ago  EXAM: MRI CERVICAL SPINE WITHOUT CONTRAST  TECHNIQUE: Multiplanar, multisequence MR imaging of the cervical spine was performed. No intravenous contrast was administered.  COMPARISON:  Cervical spine radiographs 08/09/2021  FINDINGS: Alignment: There is grade 1 anterolisthesis of C2 on C3, C3 on C4, and C4 on C5. There is reversal of the normal cervical spine lordosis centered at C5-C6.  Vertebrae: The patient is status post C6-C7 ACDF. There is no evidence of complication. Vertebral body heights are preserved. There is edema in the right posterior elements at C3-C4 with surrounding soft tissue edema. There is no osseous destruction or erosion. There is no suspicious marrow signal  abnormality.  Cord: Normal in signal and morphology.  Posterior Fossa, vertebral arteries, paraspinal tissues: There is perifacetal soft tissue edema on the right at C2-C3. The paraspinal soft tissues are otherwise unremarkable. The imaged posterior fossa is unremarkable. The vertebral artery flow voids are present.  Disc levels:  There is multilevel disc desiccation with mild loss of height at the nonsurgical levels.  C2-C3: There is grade 1 anterolisthesis with uncovertebral and  right worse than left facet arthropathy resulting in moderate right and no significant left neural foraminal stenosis and no significant spinal canal stenosis.  C3-C4: There is grade 1 anterolisthesis with a mild posterior disc osteophyte complex and uncovertebral and left worse than right facet arthropathy resulting in mild-to-moderate spinal canal stenosis and moderate left and mild right neural foraminal stenosis.  C4-C5: There is grade 1 anterolisthesis with a mild posterior disc osteophyte complex, right worse than left uncovertebral ridging, and bilateral facet arthropathy resulting in mild-to-moderate spinal canal stenosis and severe right and moderate left neural foraminal stenosis.  C5-C6: There is prominent bilateral uncovertebral ridging and bilateral facet arthropathy resulting in severe right and moderate left neural foraminal stenosis and mild spinal canal stenosis  C6-C7: Status post ACDF. There is no high-grade spinal canal or neural foraminal stenosis.  C7-T1: No significant spinal canal or neural foraminal stenosis.  IMPRESSION: 1. Status post C6-C7 ACDF without evidence of complication. 2. Adjacent segment disease at C5-C6 results in mild spinal canal stenosis and severe right and moderate left neural foraminal stenosis. 3. Mild-to-moderate spinal canal stenosis and moderate left neural foraminal stenosis at C3-C4, and mild-to-moderate spinal canal stenosis and severe right and moderate left neural foraminal stenosis at C4-C5. 4. Marrow edema in the posterior elements on the right at C2-C3 with surrounding soft tissue edema, which could reflect a source of pain. This is favored to be reactive/degenerative in nature, without evidence of osseous erosion/destruction to suggest septic arthritis; however, if there is any clinical concern for infection, recommend correlation with lab values.   Electronically Signed By: Valetta Mole M.D. On: 08/20/2021  08:48  Narrative CLINICAL DATA:  Right upper extremity radicular pain  EXAM: CERVICAL SPINE - COMPLETE 4+ VIEW  COMPARISON:  None.  FINDINGS: C6-7 anterior cervical discectomy infusion with instrumentation and performed with solid incorporation of interbody bone graft. Mild cervical kyphosis. No acute fracture or listhesis of the cervical spine. Vertebral body height is preserved. There is intervertebral disc space narrowing and endplate remodeling at U3-J4, most severe at C4-C6 in keeping with changes of moderate to severe degenerative disc disease. The prevertebral soft tissues are not thickened. The spinal canal is widely patent. Oblique views demonstrate mild neuroforaminal narrowing on the right at C2-3 C3-4, and C5-6 and moderate neuroforaminal narrowing at C4-5 secondary to uncovertebral arthrosis. On the left, there is moderate neuroforaminal narrowing at C3-4, C4-5, and C5-6 secondary to combination uncovertebral and facet arthrosis.  IMPRESSION: No acute fracture or listhesis.  ACDF C6-7  Multilevel degenerative disc and degenerative joint disease resulting in multilevel foraminal narrowing as described above.   Electronically Signed By: Fidela Salisbury M.D. On: 08/10/2021 21:29 DG Shoulder Right  Narrative CLINICAL DATA:  Radicular pain in decreased range of motion of right shoulder.  EXAM: RIGHT SHOULDER - 2+ VIEW  COMPARISON:  None.  FINDINGS: Mild inferior glenoid degenerative osteophytosis. No significant degenerative change of the right acromioclavicular joint. No significant glenohumeral joint space narrowing. No acute fracture or dislocation. C6-7 ACDF hardware is noted.  IMPRESSION: Mild glenohumeral osteoarthritis.  Electronically Signed By: Yvonne Kendall M.D. On: 08/10/2021 20:31  DG Epidurography  Narrative Clinical Data: Extensive previous cervical surgery. Patient has severe neck pain with stiffness and an inability to turn her  head. Pain is more marked on the right than the left. CERVICAL EPIDURAL INJECTION: An interlaminar approach was performed on the right at C7-T1. A 20 gauge Crawford epidural needle was advanced using loss-of-resistance technique. DIAGNOSTIC EPIDURAL INJECTION Injection of Omnipaque 300 shows a good epidural pattern with spread above and below the level of needle placement, primarily on the right. No vascular opacification is seen. THERAPEUTIC EPIDURAL INJECTION 1.5 cc of Kenalog 40 mixed with 1 cc of 1% Lidocaine and 2 cc of normal saline were then instilled. The procedure was well-tolerated, and the patient was discharged thirty minutes following the injection in good condition.  Impression Technically successful first epidural injection on the right at C7-T1.  Provider: Dawayne Cirri   Narrative FINDINGS CLINICAL DATA:  RIGHT KNEE PAIN FOLLOWING A TWISTING INJURY. FOUR VIEW, RIGHT KNEE - 10/28/2002 THERE IS A SMALL EFFUSION IN THE SUPRAPATELLAR BURSA.  NO VISIBLE FRACTURE.  THE EXAM IS OTHERWISE UNREMARKABLE. IMPRESSION SMALL KNEE EFFUSION.   Narrative CLINICAL DATA:  Foot pain history of trauma  EXAM: RIGHT FOOT COMPLETE - 3+ VIEW  COMPARISON:  None.  FINDINGS: No fracture or malalignment. Dorsal soft tissue swelling. Cerclage wire in the first proximal phalanx. Mild degenerative changes of the first MTP joint.  IMPRESSION: 1. No definite acute osseous abnormality. 2. Postsurgical changes of the first proximal phalanx.   Electronically Signed By: Donavan Foil M.D. On: 05/11/2021 15:42   Complexity Note: Imaging results reviewed.                         ROS  Cardiovascular: aspirin daily Pulmonary or Respiratory: Lung problems, Difficulty blowing air out (Emphysema), and Shortness of breath Neurological: Incontinence:  Urinary Psychological-Psychiatric: No reported psychological or psychiatric signs or symptoms such as difficulty sleeping, anxiety, depression,  delusions or hallucinations (schizophrenial), mood swings (bipolar disorders) or suicidal ideations or attempts Gastrointestinal: No reported gastrointestinal signs or symptoms such as vomiting or evacuating blood, reflux, heartburn, alternating episodes of diarrhea and constipation, inflamed or scarred liver, or pancreas or irrregular and/or infrequent bowel movements Genitourinary: No reported renal or genitourinary signs or symptoms such as difficulty voiding or producing urine, peeing blood, non-functioning kidney, kidney stones, difficulty emptying the bladder, difficulty controlling the flow of urine, or chronic kidney disease Hematological: Brusing easily Endocrine: No reported endocrine signs or symptoms such as high or low blood sugar, rapid heart rate due to high thyroid levels, obesity or weight gain due to slow thyroid or thyroid disease Rheumatologic: No reported rheumatological signs and symptoms such as fatigue, joint pain, tenderness, swelling, redness, heat, stiffness, decreased range of motion, with or without associated rash Musculoskeletal: Negative for myasthenia gravis, muscular dystrophy, multiple sclerosis or malignant hyperthermia Work History: Retired  Allergies  Ms. Tisby is allergic to montelukast.  Laboratory Chemistry Profile   Renal Lab Results  Component Value Date   BUN 10 03/31/2022   CREATININE 0.69 03/31/2022   GFR 91.27 03/31/2022   GFRAA >60 03/03/2019   GFRNONAA >60 03/03/2019   SPECGRAV <1.005 (L) 05/31/2021   PHUR 6.0 05/31/2021   PROTEINUR Negative 05/31/2021     Electrolytes Lab Results  Component Value Date   NA 138 03/31/2022   K 3.8 03/31/2022   CL 101 03/31/2022   CALCIUM 9.4 03/31/2022  MG 2.0 03/19/2019   PHOS 3.4 03/19/2019     Hepatic Lab Results  Component Value Date   AST 20 10/15/2021   ALT 17 10/15/2021   ALBUMIN 4.2 10/15/2021   ALKPHOS 72 10/15/2021   LIPASE 19 11/09/2016     ID Lab Results  Component Value Date    HIV Non Reactive 02/26/2019   SARSCOV2NAA NEGATIVE 11/10/2020     Bone Lab Results  Component Value Date   VD25OH 35.22 06/03/2019     Endocrine Lab Results  Component Value Date   GLUCOSE 89 03/31/2022   GLUCOSEU Negative 05/31/2021   HGBA1C 6.0 10/15/2021   TSH 3.04 03/19/2019     Neuropathy Lab Results  Component Value Date   HGBA1C 6.0 10/15/2021   HIV Non Reactive 02/26/2019     CNS No results found for: "COLORCSF", "APPEARCSF", "RBCCOUNTCSF", "WBCCSF", "POLYSCSF", "LYMPHSCSF", "EOSCSF", "PROTEINCSF", "GLUCCSF", "JCVIRUS", "CSFOLI", "IGGCSF", "LABACHR", "ACETBL"   Inflammation (CRP: Acute  ESR: Chronic) Lab Results  Component Value Date   CRP 1.3 (H) 03/03/2019   ESRSEDRATE 41 (H) 07/03/2019     Rheumatology Lab Results  Component Value Date   RF 23 (H) 07/03/2019   ANA POSITIVE (A) 07/03/2019     Coagulation Lab Results  Component Value Date   INR 1.0 10/03/2008   LABPROT 13.3 10/03/2008   APTT 32 10/03/2008   PLT 334.0 03/31/2022   DDIMER 0.56 (H) 08/22/2019     Cardiovascular Lab Results  Component Value Date   CKTOTAL 42 07/03/2019   CKMB 2.2 07/03/2019   TROPONINI <0.01        NO INDICATION OF MYOCARDIAL INJURY. 10/03/2008   HGB 12.9 03/31/2022   HCT 38.8 03/31/2022     Screening Lab Results  Component Value Date   SARSCOV2NAA NEGATIVE 11/10/2020   HIV Non Reactive 02/26/2019     Cancer No results found for: "CEA", "CA125", "LABCA2"   Allergens No results found for: "ALMOND", "APPLE", "ASPARAGUS", "AVOCADO", "BANANA", "BARLEY", "BASIL", "BAYLEAF", "GREENBEAN", "LIMABEAN", "WHITEBEAN", "BEEFIGE", "REDBEET", "BLUEBERRY", "BROCCOLI", "CABBAGE", "MELON", "CARROT", "CASEIN", "CASHEWNUT", "CAULIFLOWER", "CELERY"     Note: Lab results reviewed.  PFSH  Drug: Ms. Burgio  reports no history of drug use. Alcohol:  reports no history of alcohol use. Tobacco:  reports that she quit smoking about 6 years ago. Her smoking use included  cigarettes. She has a 45.00 pack-year smoking history. She has never used smokeless tobacco. Medical:  has a past medical history of Atherosclerosis (1/0612014), Blood transfusion without reported diagnosis (2005), Cancer ([place]) (Ovarian 1978), Chest pain, COPD (chronic obstructive pulmonary disease) ([place]), COVID-19 long hauler manifesting chronic dyspnea, Emphysema of lung ([place]) (2019), Oxygen deficiency (03/2019), and Oxygen dependent. Family: family history includes Arthritis in her mother; Asthma in her son; Birth defects in her son; Breast cancer in her maternal aunt; COPD in her brother, brother, and father; Diabetes in her mother and son; Hearing loss in her father; Heart disease in her mother; Heart failure in her mother; Hypertension in her mother; Kidney disease in her mother; Lung cancer in her brother; Obesity in her mother.  Past Surgical History:  Procedure Laterality Date   ABDOMINAL HYSTERECTOMY  1978   CHOLECYSTECTOMY  2010   COLONOSCOPY     COLONOSCOPY     COLONOSCOPY WITH PROPOFOL N/A 11/01/2021   Procedure: COLONOSCOPY WITH PROPOFOL;  Surgeon: Jonathon Bellows, MD;  Location: [facility] ENDOSCOPY;  Service: Gastroenterology;  Laterality: N/A;   ESOPHAGOGASTRODUODENOSCOPY     NECK SURGERY  1999   OVARIAN  CYST REMOVAL  1988   SPINE SURGERY  Neck 2002   Active Ambulatory Problems    Diagnosis Date Noted   COVID-19 virus infection 02/26/2019   Stage 2 moderate COPD by GOLD classification ([place]) 02/26/2019   Syncope 02/26/2019   Orthostatic hypotension 02/26/2019   Pneumonia due to COVID-19 virus 02/27/2019   Chronic respiratory failure with hypoxia ([place]) 04/09/2019   Bronchiectasis without complication ([place]) 16/04/9603   Cheilitis 08/27/2019   Other microscopic hematuria 08/27/2019   Emphysema due to alpha-1-antitrypsin deficiency ([place]) 09/16/2019   Prediabetes 09/30/2019   Tear of left gluteus minimus tendon 09/30/2019   Aortic atherosclerosis ([place]) 09/30/2019   Chronic cough  12/11/2020   Stress incontinence 03/15/2021   Urinary frequency 03/15/2021   Pulsatile tinnitus of left ear 03/15/2021   Right foot pain 05/11/2021   Carotid stenosis 06/15/2021   Hyperlipidemia 06/15/2021   Anxiety 01/30/2017   Benign neoplasm of parathyroid gland 06/04/2012   Bunion 06/04/2012   Chronic bronchitis ([place]) 07/14/2015   Chronic constipation 11/04/2016   Depressive disorder 02/11/2008   Enthesopathy of ankle and tarsus 06/04/2012   Gout 08/10/2010   History of DVT (deep vein thrombosis) 05/21/2010   Left buttock pain 04/12/2018   Smoking history 04/15/2015   Numbness and tingling in both hands 06/17/2021   Chronic right shoulder pain 08/09/2021   History of cervical spinal surgery 08/09/2021   Radicular pain in right arm 08/09/2021   Current mild episode of major depressive disorder without prior episode ([place]) 10/01/2021   COPD exacerbation ([place]) 10/25/2021   Preop pulmonary/respiratory exam 12/15/2021   Atherosclerosis of native arteries of extremity with intermittent claudication ([place]) 01/28/2022   Acute on chronic respiratory failure with hypoxemia ([facility]) 02/27/2022   Cervical fusion syndrome (C6-7) 04/07/2022   Cervical disc disorder with radiculopathy of cervical region (right) 04/07/2022   Requires continuous at home supplemental oxygen (3L)  COPD 04/07/2022   Chronic pain syndrome 04/07/2022   Resolved Ambulatory Problems    Diagnosis Date Noted   Thrombosed external hemorrhoids 10/01/2020   Past Medical History:  Diagnosis Date   Atherosclerosis 1/0612014   Blood transfusion without reported diagnosis 2005   Cancer [facility]) Ovarian 1978   Chest pain    COPD (chronic obstructive pulmonary disease) ([place])    COVID-19 long hauler manifesting chronic dyspnea    Emphysema of lung ([place]) 2019   Oxygen deficiency 03/2019   Oxygen dependent    Constitutional Exam  General appearance: Well nourished, well developed, and well hydrated. In no apparent acute  distress Vitals:   04/07/22 0912  BP: (!) 161/69  Pulse: 68  Resp: 18  Temp: (!) 97.2 F (36.2 C)  TempSrc: Temporal  SpO2: 99%  Weight: 150 lb 12.8 oz (68.4 kg)  Height: '5\' 5"'  (1.651 m)   BMI Assessment: Estimated body mass index is 25.09 kg/m as calculated from the following:   Height as of this encounter: '5\' 5"'  (1.651 m).   Weight as of this encounter: 150 lb 12.8 oz (68.4 kg).  BMI interpretation table: BMI level Category Range association with higher incidence of chronic pain  <18 kg/m2 Underweight   18.5-24.9 kg/m2 Ideal body weight   25-29.9 kg/m2 Overweight Increased incidence by 20%  30-34.9 kg/m2 Obese (Class I) Increased incidence by 68%  35-39.9 kg/m2 Severe obesity (Class II) Increased incidence by 136%  >40 kg/m2 Extreme obesity (Class III) Increased incidence by 254%   Patient's current BMI Ideal Body weight  Body mass index is 25.09 kg/m. Ideal  body weight: 57 kg (125 lb 10.6 oz) Adjusted ideal body weight: 61.6 kg (135 lb 11.5 oz)   BMI Readings from Last 4 Encounters:  04/07/22 25.09 kg/m  03/31/22 24.71 kg/m  03/18/22 25.56 kg/m  02/15/22 25.11 kg/m   Wt Readings from Last 4 Encounters:  04/07/22 150 lb 12.8 oz (68.4 kg)  03/31/22 150 lb 12.8 oz (68.4 kg)  03/18/22 156 lb (70.8 kg)  02/15/22 153 lb 4 oz (69.5 kg)    Psych/Mental status: Alert, oriented x 3 (person, place, & time)       Eyes: PERLA Respiratory: Oxygen-dependent COPD 3 L oxygen continuous  Cervical Spine Area Exam  Skin & Axial Inspection: Well healed scar from previous spine surgery detected Alignment: Symmetrical Functional ROM: Pain restricted ROM     severely limited on the right Stability: No instability detected Muscle Tone/Strength: Functionally intact. No obvious neuro-muscular anomalies detected. Sensory (Neurological): Neurogenic pain pattern Palpation: No palpable anomalies             Positive Spurling's on the right  Upper Extremity (UE) Exam    Side: Right  upper extremity  Side: Left upper extremity  Skin & Extremity Inspection: Skin color, temperature, and hair growth are WNL. No peripheral edema or cyanosis. No masses, redness, swelling, asymmetry, or associated skin lesions. No contractures.  Skin & Extremity Inspection: Skin color, temperature, and hair growth are WNL. No peripheral edema or cyanosis. No masses, redness, swelling, asymmetry, or associated skin lesions. No contractures.  Functional ROM: Pain restricted ROM for shoulder and elbow  Functional ROM: Unrestricted ROM          Muscle Tone/Strength: Functionally intact. No obvious neuro-muscular anomalies detected.  Muscle Tone/Strength: Functionally intact. No obvious neuro-muscular anomalies detected.  Sensory (Neurological): Neurogenic pain pattern          Sensory (Neurological): Unimpaired          Palpation: No palpable anomalies              Palpation: No palpable anomalies              Provocative Test(s):  Phalen's test: deferred Tinel's test: deferred Apley's scratch test (touch opposite shoulder):  Action 1 (Across chest): Decreased ROM Action 2 (Overhead): Decreased ROM Action 3 (LB reach): Decreased ROM   Provocative Test(s):  Phalen's test: deferred Tinel's test: deferred Apley's scratch test (touch opposite shoulder):  Action 1 (Across chest): deferred Action 2 (Overhead): deferred Action 3 (LB reach): deferred     Assessment  Primary Diagnosis & Pertinent Problem List: The primary encounter diagnosis was Cervical fusion syndrome (C6-7). Diagnoses of Chronic right shoulder pain, Cervical disc disorder with radiculopathy of cervical region (right), COPD exacerbation ([place]), History of cervical spinal surgery, Requires continuous at home supplemental oxygen (3L)  COPD, Chronic pain syndrome, and Radicular pain in right arm were also pertinent to this visit.  Visit Diagnosis (New problems to examiner): 1. Cervical fusion syndrome (C6-7)   2. Chronic right shoulder  pain   3. Cervical disc disorder with radiculopathy of cervical region (right)   4. COPD exacerbation ([place])   5. History of cervical spinal surgery   6. Requires continuous at home supplemental oxygen (3L)  COPD   7. Chronic pain syndrome   8. Radicular pain in right arm    Plan of Care (Initial workup plan)   Maria [name] is a pleasant 66 year old female who presents with a chief complaint of cervical spine pain with radiation into her right arm down  to her thumb and index finger predominantly.  She has severely limited range of motion.  She has a history of cervical spinal fusion at C6-C7.  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  She has done home directed physical therapy exercises in the past but due to pain and severely limited range of motion, she was unable to participate in them completely.  She is on 3 L of home oxygen due to COPD.  She is high risk for surgery and not deemed a surgical candidate.  She is on gabapentin 300 mg 3 times a day.  She has not increased the dose.  I recommend that she increase to 400 mg 3 times daily for the next month followed by 600 mg 3 times daily thereafter as long as she is not having any side effects or adverse complications.  I also discussed an interlaminar cervical epidural steroid injection.  We will hold off on doing this to see if increase in gabapentin provides benefit.  I would also like to obtain x-rays of her cervical spine and bilateral shoulders to evaluate for any shoulder pathology that could be contributing to her symptoms.  I will see the patient back in 3 to 4 weeks to assess response.  Imaging Orders         DG Cervical Spine Complete         DG Shoulder Right         DG Shoulder Left      Pharmacotherapy (current): Medications ordered:  Meds ordered this encounter  Medications   gabapentin (NEURONTIN) 400 MG capsule    Sig: Take 1 capsule (400 mg  total) by mouth 3 (three) times daily.    Dispense:  90 capsule    Refill:  0   gabapentin (NEURONTIN) 600 MG tablet    Sig: Take 1 tablet (600 mg total) by mouth 3 (three) times daily.    Dispense:  90 tablet    Refill:  0   Medications administered during this visit: Kendre Sires. [facility]" had no medications administered during this visit.    Interventional management options: Ms. Henthorn was informed that there is no guarantee that she would be a candidate for interventional therapies. The decision will be based on the results of diagnostic studies, as well as Ms. Slingerland's risk profile.  Procedure(s) under consideration:  Cervical epidural steroid injection Possible suprascapular nerve block on the right  Provider-requested follow-up: Return in about 6 weeks (around 05/17/2022) for Medication Management, in person.   I spent a total of 60 minutes reviewing chart data, face-to-face evaluation with the patient, counseling and coordination of care as detailed above.   Future Appointments  Date Time Provider [place]  04/18/2022 11:00 AM [name], MD [facility]  04/27/2022 10:00 AM [facility] 1 [facility] None  05/09/2022  9:30 AM Parrett, Fonnie Mu, NP [facility] None  05/17/2022  8:00 AM Gillis Santa, MD [facility] None  06/01/2022 10:00 AM Michela Pitcher, NP [facility]  07/28/2022 10:45 AM [facility] VASC 3 [facility] None  07/28/2022 11:45 AM Schnier, Dolores Lory, MD [facility] None    Note by: Gillis Santa, MD Date: 04/07/2022; Time: 3:35 PM

## 2022-04-18 ENCOUNTER — Ambulatory Visit (INDEPENDENT_AMBULATORY_CARE_PROVIDER_SITE_OTHER): Payer: Medicare HMO | Admitting: Family Medicine

## 2022-04-18 ENCOUNTER — Encounter: Payer: Self-pay | Admitting: Family Medicine

## 2022-04-18 VITALS — BP 130/52 | HR 74 | Temp 97.5°F | Ht 65.0 in | Wt 151.4 lb

## 2022-04-18 DIAGNOSIS — R69 Illness, unspecified: Secondary | ICD-10-CM | POA: Diagnosis not present

## 2022-04-18 DIAGNOSIS — F320 Major depressive disorder, single episode, mild: Secondary | ICD-10-CM

## 2022-04-18 DIAGNOSIS — J449 Chronic obstructive pulmonary disease, unspecified: Secondary | ICD-10-CM

## 2022-04-18 DIAGNOSIS — J9611 Chronic respiratory failure with hypoxia: Secondary | ICD-10-CM

## 2022-04-18 DIAGNOSIS — Q761 Klippel-Feil syndrome: Secondary | ICD-10-CM

## 2022-04-18 MED ORDER — ESCITALOPRAM OXALATE 20 MG PO TABS: 20.0000 mg | ORAL_TABLET | Freq: Every day | ORAL | 1 refills | Status: DC

## 2022-04-18 NOTE — Patient Instructions (Signed)
I'm glad you are doing better!  Consider reaching out to pain if no improvement after 2 weeks to see if they are OK increasing the dose sooner.

## 2022-04-18 NOTE — Assessment & Plan Note (Signed)
Following with pain. Uptitrating gabapentin w/ no improvement.

## 2022-04-18 NOTE — Assessment & Plan Note (Signed)
Recent exacerbation, pt improving but using slightly higher oxygen 4 to 6 L.  Appreciate pulmonology support.

## 2022-04-18 NOTE — Progress Notes (Signed)
Subjective:     Maria [name] is a 66 y.o. female presenting for Follow-up (5 week- depression)     HPI  #Depression - still having some insomnia - waking up several times overnight - is coughing during these episodes - feels the lexapro is helping - no sitting and crying as much  #cervical radicular pain - cannot get surgery due to risk factors Has been going to pain management They recently adjusted her gabapentin dose taking 400 mg   #copd/oxygen dependent - is on 4-6 L - recent abx and steroids 03/31/2022 with pulmonology   Review of Systems   Social History   Tobacco Use  Smoking Status Former   Packs/day: 1.00   Years: 45.00   Total pack years: 45.00   Types: Cigarettes   Quit date: 03/18/2016   Years since quitting: 6.0  Smokeless Tobacco Never        Objective:    BP Readings from Last 3 Encounters:  04/18/22 (!) 130/52  04/07/22 (!) 161/69  03/31/22 124/70   Wt Readings from Last 3 Encounters:  04/18/22 151 lb 6 oz (68.7 kg)  04/07/22 150 lb 12.8 oz (68.4 kg)  03/31/22 150 lb 12.8 oz (68.4 kg)    BP (!) 130/52   Pulse 74   Temp (!) 97.5 F (36.4 C) (Temporal)   Ht '5\' 5"'$  (1.651 m)   Wt 151 lb 6 oz (68.7 kg)   SpO2 99%   BMI 25.19 kg/m    Physical Exam Constitutional:      General: She is not in acute distress.    Appearance: She is well-developed. She is not diaphoretic.  HENT:     Right Ear: External ear normal.     Left Ear: External ear normal.     Nose: Nose normal.  Eyes:     Conjunctiva/sclera: Conjunctivae normal.  Cardiovascular:     Rate and Rhythm: Normal rate.     Heart sounds: Heart sounds are distant.  Pulmonary:     Effort: Pulmonary effort is normal.     Breath sounds: Decreased breath sounds present. No wheezing.  Musculoskeletal:     Cervical back: Neck supple.  Skin:    General: Skin is warm and dry.     Capillary Refill: Capillary refill takes less than 2 seconds.  Neurological:     Mental Status: She  is alert. Mental status is at baseline.  Psychiatric:        Mood and Affect: Mood normal.        Behavior: Behavior normal.         04/18/2022   12:25 PM 03/18/2022    8:39 AM 02/04/2022   10:38 AM  Depression screen PHQ 2/9  Decreased Interest 0 3 3  Down, Depressed, Hopeless '1 2 3  '$ PHQ - 2 Score '1 5 6  '$ Altered sleeping '1 3 3  '$ Tired, decreased energy '1 3 3  '$ Change in appetite 0 0 2  Feeling bad or failure about yourself  0 0   Trouble concentrating 0 0 2  Moving slowly or fidgety/restless 0 0 0  Suicidal thoughts 0 0 0  PHQ-9 Score '3 11 16  '$ Difficult doing work/chores Somewhat difficult Not difficult at all Extremely dIfficult        Assessment & Plan:   Problem List Items Addressed This Visit       Respiratory   Stage 2 moderate COPD by GOLD classification ([place])    Stable, recovering from recent  exacerbation, she is considering getting part of a research trial through pulmonology with a every 2 weeks injection.  Discussed it seemed reasonable to try to see if it will reduce her exacerbations. Cont breztri 160-9-4.8 2 puffs bid.      Chronic respiratory failure with hypoxia (H[facility])    Recent exacerbation, pt improving but using slightly higher oxygen 4 to 6 L.  Appreciate pulmonology support.        Musculoskeletal and Integument   Cervical fusion syndrome (C6-7)    Following with pain. Uptitrating gabapentin w/ no improvement.         Other   Current mild episode of major depressive disorder without prior episode ([place]) - Primary    Improved. Still with some insomnia likely 2/2 to COPD. Cont lexapro 20 mg.       Relevant Medications   escitalopram (LEXAPRO) 20 MG tablet     Return if symptoms worsen or fail to improve.  [name], MD

## 2022-04-18 NOTE — Assessment & Plan Note (Signed)
Improved. Still with some insomnia likely 2/2 to COPD. Cont lexapro 20 mg.

## 2022-04-18 NOTE — Assessment & Plan Note (Addendum)
Stable, recovering from recent exacerbation, she is considering getting part of a research trial through pulmonology with a every 2 weeks injection.  Discussed it seemed reasonable to try to see if it will reduce her exacerbations. Cont breztri 160-9-4.8 2 puffs bid.

## 2022-04-23 DIAGNOSIS — J449 Chronic obstructive pulmonary disease, unspecified: Secondary | ICD-10-CM | POA: Diagnosis not present

## 2022-04-23 DIAGNOSIS — J9611 Chronic respiratory failure with hypoxia: Secondary | ICD-10-CM | POA: Diagnosis not present

## 2022-04-26 ENCOUNTER — Other Ambulatory Visit: Payer: Self-pay | Admitting: Internal Medicine

## 2022-04-27 ENCOUNTER — Ambulatory Visit
Admission: RE | Admit: 2022-04-27 | Discharge: 2022-04-27 | Disposition: A | Discharge status: Home / Self Care | Payer: Medicare HMO | Source: Ambulatory Visit | Attending: Internal Medicine | Admitting: Internal Medicine

## 2022-04-27 DIAGNOSIS — J449 Chronic obstructive pulmonary disease, unspecified: Secondary | ICD-10-CM | POA: Insufficient documentation

## 2022-04-27 DIAGNOSIS — R079 Chest pain, unspecified: Secondary | ICD-10-CM | POA: Diagnosis not present

## 2022-04-27 DIAGNOSIS — R0602 Shortness of breath: Secondary | ICD-10-CM

## 2022-04-27 DIAGNOSIS — R0600 Dyspnea, unspecified: Secondary | ICD-10-CM | POA: Insufficient documentation

## 2022-04-27 LAB — ECHOCARDIOGRAM COMPLETE
AR max vel: 2.76 cm2
AV Area VTI: 2.92 cm2
AV Area mean vel: 2.67 cm2
AV Mean grad: 3 mmHg
AV Peak grad: 4.6 mmHg
Ao pk vel: 1.07 m/s
Area-P 1/2: 3.6 cm2
S' Lateral: 2.3 cm

## 2022-04-27 NOTE — Progress Notes (Signed)
*  PRELIMINARY RESULTS* Echocardiogram 2D Echocardiogram has been performed.  Maria [name] 04/27/2022, 11:05 AM

## 2022-05-06 ENCOUNTER — Telehealth: Payer: Self-pay | Admitting: Internal Medicine

## 2022-05-06 ENCOUNTER — Encounter: Payer: Medicare HMO | Admitting: *Deleted

## 2022-05-06 ENCOUNTER — Encounter (INDEPENDENT_AMBULATORY_CARE_PROVIDER_SITE_OTHER): Payer: Medicare HMO | Admitting: Internal Medicine

## 2022-05-06 DIAGNOSIS — J449 Chronic obstructive pulmonary disease, unspecified: Secondary | ICD-10-CM

## 2022-05-06 DIAGNOSIS — Z006 Encounter for examination for normal comparison and control in clinical research program: Secondary | ICD-10-CM

## 2022-05-06 DIAGNOSIS — J441 Chronic obstructive pulmonary disease with (acute) exacerbation: Secondary | ICD-10-CM

## 2022-05-06 NOTE — Research (Signed)
Title: A Phase III, randomized, double-blind, placebo controlled, multicenter study to evaluate the efficacy and safety of astegolimab in patients with chronic obstructive pulmonary disease    Dose and Duration of Treatment: Astegolimab is presented as a sterile, slightly brown-yellow solution. Each single-use, 2.25 mL pre-filled syringe contains 1.7 mL deliverable volume. Astegolimab drug product is formulated at 140 mg/mL astegolimab with 114 mM succinic acid, 200 mM L-arginine, 10 mM L-methionine, 0.06% (w/v) polysorbate 20, pH 5.7. Placebo for astegolimab is supplied in an identical pre-filled syringe configuration.   Protocol # [id number]   Sponsor: [employer] [place], [place]    Conservator, museum/gallery: Astegolimab [id number])   Mechanism of action: Astegolimab (also known as [id number] or [id number]) is a fully human, IgG2 monoclonal antibody that binds with high affinity to the interleukin (IL)-33 (IL-33) receptor, ST2, thereby blocking the signaling of IL-33, an inflammatory cytokine of the IL-1 family and member of the "alarmin" class of molecules. Astegolimab binds with high affinity to the human and cynomolgus monkey receptor for IL-33, ST2, and blocks IL-33 binding, thus inhibiting association with the IL-1R accessory protein (AcP) co-receptor and formation of an activated receptor complex.   Key Inclusion Criteria: Age 26-80 years at Visit 1   Documented COPD diagnosis for ?12 months prior to Visit 1 History of frequent exacerbations, defined as having had 2 or more moderate or severe COPD exacerbations within 12 months prior to Visit 1 Post-bronchodilator FEV1 ?20% and <80% of predicted at Visit 1 or Visit 2  Post-bronchodilator FEV1/FVC <0.70 at Visit 1 or Visit 2  mMRC score ?2 at screening   Current tobacco smoker or former smoker (having stopped smoking for at least 6 months prior to Visit 1) with a history of smoking ?10  pack-years On optimized COPD maintenance therapy as defined below for ?12 months prior to Visit 1            -Inhaled corticosteroid (ICS) plus long-acting beta-agonist (LABA)           -Long-acting muscarinic antagonist (LAMA) plus LABA           -ICS plus LAMA plus LABA  Key Exclusion Criteria: Current documented diagnosis of asthma  Diagnosis of a-1 antitrypsin deficiency  History of long-term treatment with oxygen at >4.0 liters/minute  Any infection that resulted in hospital admission for ?24 hours and/or treatment with oral, IV, or IM antibiotics within 4 weeks prior to Visit 1 or during screening Upper or lower respiratory tract infection within 4 weeks prior to Visit 1 or during screening Treatment with oral, IV, or IM corticosteroids (>10 mg/day prednisolone or equivalent) within 4 weeks prior to initiation of study drug Treatment with a licensed biologic agent (e.g., omalizumab, dupilumab, and/or anti-IL-5 therapies) within 3 months or 5 drug-elimination half-lives (whichever is longer) prior to screening  Planned surgical intervention during the study Known immunodeficiency, including but not limited to, HIV infection  AST, ALT, or total bilirubin elevation ?2.0 x the upper limit of normal (ULN) during screening  History of malignancy within 5 years prior to screening, with the exception of malignancies with a negligible risk of metastasis or death (e.g., 5-year overall survival rate >90%), such as adequately treated carcinoma in situ of the cervix, non-melanoma carcinoma, localized prostate cancer, or ductal carcinoma in situ Unstable cardiac disease, myocardial infarction, or New York Heart Association Class III or IV heart failure within 12 months prior to screening History or absence of an abnormal ECG that is  deemed clinically significant by the investigator, including complete left bundle branch block or second- or third-degree atrioventricular heart block     Integrated  Pharmacokinetic/Pharmacodynamic Analysis:  In Studies [id number], [id number], and [id number], exploratory biomarker analysis showed there were consistent decreases in blood eosinophil counts throughout the treatment period, potentially mediated by a direct effect of IL-33 on eosinophil progenitors. In [place], there was no significant difference in fractional exhaled nitric oxide (FeNO) levels (reflecting airway IL-4/IL-13 activity) between astegolimab-treated groups relative to placebo throughout the treatment period. These data suggest that astegolimab has only a limited effect on Type 2 inflammation in asthma. No data are applicable for Study [id number]. No data are available yet for Study [id number].  Special Warnings/Considerations: Administration of astegolimab, a protein therapeutic, may lead to the development of anti-astegolimab antibodies which could lead to AEs and/or decreased exposure. In non-clinical studies (see Section 4.2), ADA incidence has generally been low and there was no apparent ADA impact on PK and safety in these studies. To date, the immunogenicity rates observed with astegolimab in clinical studies have been relatively low as well (see Immunogenicity Section 5.6). Several clinical studies have been conducted in patients with asthma, atopic dermatitis, COPD (IIS Study [id number]) and COVID-19 severe pneumonia, which generally showed low incidence of ADAs (Table 31). There has been no correlation between ADA status and clinical findings or increased incidence of AEs. Astegolimab is now being considered in a larger study for the treatment of COPD. This patient population is typically considered to have a hyper-responsive immune system. Route of administration for this molecule will be Maria [name], either Q2W or Q4W. These factors increase the risk of development of an immune response to astegolimab, specifically with repeat dosing. From the previous IIS Study [id number], incidence of ADAs was low in COPD  patients; this remains to be confirmed in a larger study. To monitor ADA development in ongoing studies, serum samples will be collected from patients at protocol-defined intervals. Patients who test positive for antibodies and have clinical sequelae that are considered potentially related to an ADA response may also be asked to return for additional follow-up testing.  Drug Interaction Studies: No PK drug interaction studies have been conducted to date.  Serious Adverse Reactions Observed in Asthma Study: During the treatment period, a similar proportion of patients across all cohorts experienced at least one AE, regardless of causality (Table 22). In total, 77.2%, 70.9%, 72.2%, and 72.1% of patients reported at least 1 AE in the placebo, 70 mg, 210 mg, and 490 mg groups, respectively. The most common AE's (>5% in any treatment group) were asthma, nasopharyngitis, upper respiratory tract infection, headache, and injection site reaction (ISR). The most common drug-related AE was ISR, which was reported more frequently in the astegolimab treatment groups than in the placebo group (1 [0.8%] patient in the placebo group, 10 [7.9%] patients with 70 mg, 8 [6.3%] patients with 210 mg, 6 [4.9%] patients with 490 mg). All ISRs were non-serious and mild or moderate in severity. During the treatment period, 50 SAEs were reported in 37 (7.4%) patients. The number of patients reporting SAEs was comparable across all cohorts (11 SAEs in 8 patients on placebo, 21 SAEs in 14 patients on 70 mg, 11 SAEs in 9 patients on 210 mg, and 7 SAEs in 6 patients on 490 mg). The most common SAE was asthma. One SAE of moderate livedo reticularis (70 mg) was considered a suspected unexpected serious adverse drug reaction (SUSAR) related to astegolimab and was reported two days  after the second dose, leading to discontinuation of astegolimab. Two (0.4%) patients reported anaphylaxis and hypersensitivity reactions: 1 severe SAE of  anaphylactic reaction (placebo), and 1 moderate hypersensitivity (490 mg) considered related to astegolimab. Three (0.6%) patients experienced a potential Major Adverse Cardiac Event (MACE) (1 patient each in the placebo, 70 mg, and 210 mg groups). None of the potential MACE were considered related to astegolimab. Overall, 233 (46.4%) patients reported events of infection. A comparable number of patients reported infection across all treatment groups (65 [51.2%] patients on placebo, 55 [43.3%] patients on 70 mg, 58 [46.0%] patients on 210 mg, and 55 [45.1%] patients on 490 mg). The most frequently reported infection (?10% incidence) was nasopharyngitis (12.7%). One patient on astegolimab 210 mg and the partner of one patient on placebo became pregnant during the study. Both delivered normal/healthy babies. Two deaths, unrelated to study drug, were reported: one patient on 210 mg astegolimab died following an SAE of asthma; the other patient on 490 mg astegolimab had an unexplained death. There were no clinically meaningful changes in laboratory parameters, vital signs, or ECG results, other than the 10% decrease in mean blood eosinophil counts in the astegolimab-treated groups, with no safety concerns. Treatment-induced ADAs were comparable between the astegolimab groups and had no impact on safety. Overall, astegolimab was well tolerated at all doses used and had a safety profile consistent with that observed in the previous astegolimab Phase I studies.  Serious Adverse Reactions Observed in Previous COPD Study: In the completed IIS Study [id number], a total of 81 patients received at least one dose of astegolimab or placebo. The safety profile of astegolimab was similar to that of placebo. There were a total of 222 AEs reported in 62 patients. A total of 28 (72%) patients in the placebo arm and 34 (81%) patients in the astegolimab arm reported at least one AE. The most commonly reported AEs were headache followed  by urinary tract infection and viral upper respiratory tract infection. A total of 39 SAEs were reported in 28 (35%) patients; 16 (41%) patients in the placebo group and 12 (29%) in the astegolimab group. The most commonly reported SAE was hospital admission for community acquired pneumonia. Four SAEs resulted in patient discontinuation from treatment (1 patient in the placebo group and 3 patients in the astegolimab group). One patient in the placebo group experienced an AESI of potential MACE (heart failure, unrelated to the study treatment). No anaphylaxis or pregnancies were reported. Two deaths, unrelated to study treatment, were reported: 1 patient in the placebo group died after hospital acquired pneumonia, and another patient in the placebo group died after pneumonia and type 2 respiratory failure.  Safety Data: Astegolimab has been generally well tolerated. There have been 30 patient deaths across all astegolimab studies (Section 5.5.2), none of which were considered related to astegolimab. A total of 144 subjects experienced a total of 224 SAEs across all astegolimab studies. Of these, an SAE livedo reticularis observed in 1 patient was considered related to astegolimab by the investigator (Section 5.5.3). AEs leading to withdrawal (Section 5.5.4) have generally occurred at a rate similar to what would be expected for clinical trials in the studies' respective indications.   [facility] [place] Coordinator note :   This visit for Subject Maria [name] with DOB: September 03, 1955 on 05/06/2022 for the above protocol is Visit/Encounter # Visit 1 Screening  and is for purpose of research.   The consent for this encounter is under Protocol Version Protocol:  Version 2, dated [id number] IB: version 7 dated April 2022 ICF: Main version 12Apr2023, revised 17July2023 [facility] [phone], revised 01Sep2023 Lab Manual: V2.1.0 [id number] and  ,is currently IRB approved.   Subject expressed  continued interest and consent in continuing as a study subject. Subject confirmed that there was  no change in contact information (e.g. address, telephone, email). Subject thanked for participation in research and contribution to science.  In this visit 05/06/2022 the subject will be evaluated by  Principal Investigator [name]. This research coordinator has verified that the above investigator is up to date with his/her training logs.    This visit is a key visit of screening. The PI is available for this visit.    Subject consented to that above named protocol. Physical exam was then completed, mMRD questionnaire completed, and then ECG was completed which showed he subjects QTCf to be 427m and the study criteria cutoff is nothing >4723m Dr. RaChase Callerersonally viewed the ECG report and spoke with the patient. Subject is aware that this may qualify her as a screen failure in this study and that we could not move forward with the rest of the procedures/assessments today, but that we may be able to bring her back at another time to proceed. Subject stated understanding.  No further study assessments or procedures were completed following the ECG. Will await feedback from Dr. RaJamey Reasnd the sponsor on how to proceed with this subject.    Signed by JeRandolph BingCMHarveyCCAdvanceoordinator [place]1:26 AM 05/06/2022

## 2022-05-06 NOTE — Progress Notes (Signed)
Title: A Phase III, randomized, double-blind, placebo controlled, multicenter study to evaluate the efficacy and safety of astegolimab in patients with chronic obstructive pulmonary disease    Dose and Duration of Treatment: Astegolimab is presented as a sterile, slightly brown-yellow solution. Each single-use, 2.25 mL pre-filled syringe contains 1.7 mL deliverable volume. Astegolimab drug product is formulated at 140 mg/mL astegolimab with 114 mM succinic acid, 200 mM L-arginine, 10 mM L-methionine, 0.06% (w/v) polysorbate 20, pH 5.7. Placebo for astegolimab is supplied in an identical pre-filled syringe configuration.   Protocol # [id number]   Sponsor: F. [place], [place]    Mechanism of action: Astegolimab (also known as [id number] or [phone]) is a fully human, IgG2 monoclonal antibody that binds with high affinity to the interleukin (IL)-33 (IL-33) receptor, ST2, thereby blocking the signaling of IL-33, an inflammatory cytokine of the IL-1 family and member of the "alarmin" class of molecules. Astegolimab binds with high affinity to the human and cynomolgus monkey receptor for IL-33, ST2, and blocks IL-33 binding, thus inhibiting association with the IL-1R accessory protein (AcP) co-receptor and formation of an activated receptor complex.   Key Inclusion Criteria: Age 6-80 years at Visit 1   Documented COPD diagnosis for ?12 months prior to Visit 1 History of frequent exacerbations, defined as having had 2 or more moderate or severe COPD exacerbations within 12 months prior to Visit 1 Post-bronchodilator FEV1 ?20% and <80% of predicted at Visit 1 or Visit 2  Post-bronchodilator FEV1/FVC <0.70 at Visit 1 or Visit 2  mMRC score ?2 at screening   Current tobacco smoker or former smoker (having stopped smoking for at least 6 months prior to Visit 1) with a history of smoking ?10 pack-years On optimized COPD maintenance therapy as defined below for  ?12 months prior to Visit 1            -Inhaled corticosteroid (ICS) plus long-acting beta-agonist (LABA)           -Long-acting muscarinic antagonist (LAMA) plus LABA           -ICS plus LAMA plus LABA  Key Exclusion Criteria: Current documented diagnosis of asthma  Diagnosis of a-1 antitrypsin deficiency  History of long-term treatment with oxygen at >4.0 liters/minute  Any infection that resulted in hospital admission for ?24 hours and/or treatment with oral, IV, or IM antibiotics within 4 weeks prior to Visit 1 or during screening Upper or lower respiratory tract infection within 4 weeks prior to Visit 1 or during screening Treatment with oral, IV, or IM corticosteroids (>10 mg/day prednisolone or equivalent) within 4 weeks prior to initiation of study drug Treatment with a licensed biologic agent (e.g., omalizumab, dupilumab, and/or anti-IL-5 therapies) within 3 months or 5 drug-elimination half-lives (whichever is longer) prior to screening  Planned surgical intervention during the study Known immunodeficiency, including but not limited to, HIV infection  AST, ALT, or total bilirubin elevation ?2.0 x the upper limit of normal (ULN) during screening  History of malignancy within 5 years prior to screening, with the exception of malignancies with a negligible risk of metastasis or death (e.g., 5-year overall survival rate >90%), such as adequately treated carcinoma in situ of the cervix, non-melanoma carcinoma, localized prostate cancer, or ductal carcinoma in situ Unstable cardiac disease, myocardial infarction, or New York Heart Association Class III or IV heart failure within 12 months prior to screening History or absence of an abnormal ECG that is deemed clinically significant by the investigator, including  complete left bundle branch block or second- or third-degree atrioventricular heart block     Integrated Pharmacokinetic/Pharmacodynamic Analysis:  In Studies [id number], [id number], and  BT51761, exploratory biomarker analysis showed there were consistent decreases in blood eosinophil counts throughout the treatment period, potentially mediated by a direct effect of IL-33 on eosinophil progenitors. In [place], there was no significant difference in fractional exhaled nitric oxide (FeNO) levels (reflecting airway IL-4/IL-13 activity) between astegolimab-treated groups relative to placebo throughout the treatment period. These data suggest that astegolimab has only a limited effect on Type 2 inflammation in asthma. No data are applicable for Study [id number]. No data are available yet for Study [id number].  Special Warnings/Considerations: Administration of astegolimab, a protein therapeutic, may lead to the development of anti-astegolimab antibodies which could lead to AEs and/or decreased exposure. In non-clinical studies (see Section 4.2), ADA incidence has generally been low and there was no apparent ADA impact on PK and safety in these studies. To date, the immunogenicity rates observed with astegolimab in clinical studies have been relatively low as well (see Immunogenicity Section 5.6). Several clinical studies have been conducted in patients with asthma, atopic dermatitis, COPD (IIS Study IO27035) and COVID-19 severe pneumonia, which generally showed low incidence of ADAs (Table 31). There has been no correlation between ADA status and clinical findings or increased incidence of AEs. Astegolimab is now being considered in a larger study for the treatment of COPD. This patient population is typically considered to have a hyper-responsive immune system. Route of administration for this molecule will be , either Q2W or Q4W. These factors increase the risk of development of an immune response to astegolimab, specifically with repeat dosing. From the previous IIS Study [id number], incidence of ADAs was low in COPD patients; this remains to be confirmed in a larger study. To monitor ADA  development in ongoing studies, serum samples will be collected from patients at protocol-defined intervals. Patients who test positive for antibodies and have clinical sequelae that are considered potentially related to an ADA response may also be asked to return for additional follow-up testing.  Drug Interaction Studies: No PK drug interaction studies have been conducted to date.  Serious Adverse Reactions Observed in Asthma Study: During the treatment period, a similar proportion of patients across all cohorts experienced at least one AE, regardless of causality (Table 22). In total, 77.2%, 70.9%, 72.2%, and 72.1% of patients reported at least 1 AE in the placebo, 70 mg, 210 mg, and 490 mg groups, respectively. The most common AE's (>5% in any treatment group) were asthma, nasopharyngitis, upper respiratory tract infection, headache, and injection site reaction (ISR). The most common drug-related AE was ISR, which was reported more frequently in the astegolimab treatment groups than in the placebo group (1 [0.8%] patient in the placebo group, 10 [7.9%] patients with 70 mg, 8 [6.3%] patients with 210 mg, 6 [4.9%] patients with 490 mg). All ISRs were non-serious and mild or moderate in severity. During the treatment period, 50 SAEs were reported in 37 (7.4%) patients. The number of patients reporting SAEs was comparable across all cohorts (11 SAEs in 8 patients on placebo, 21 SAEs in 14 patients on 70 mg, 11 SAEs in 9 patients on 210 mg, and 7 SAEs in 6 patients on 490 mg). The most common SAE was asthma. One SAE of moderate livedo reticularis (70 mg) was considered a suspected unexpected serious adverse drug reaction (SUSAR) related to astegolimab and was reported two days after the second dose, leading to discontinuation  of astegolimab. Two (0.4%) patients reported anaphylaxis and hypersensitivity reactions: 1 severe SAE of anaphylactic reaction (placebo), and 1 moderate hypersensitivity (490 mg) considered  related to astegolimab. Three (0.6%) patients experienced a potential Major Adverse Cardiac Event (MACE) (1 patient each in the placebo, 70 mg, and 210 mg groups). None of the potential MACE were considered related to astegolimab. Overall, 233 (46.4%) patients reported events of infection. A comparable number of patients reported infection across all treatment groups (65 [51.2%] patients on placebo, 55 [43.3%] patients on 70 mg, 58 [46.0%] patients on 210 mg, and 55 [45.1%] patients on 490 mg). The most frequently reported infection (?10% incidence) was nasopharyngitis (12.7%). One patient on astegolimab 210 mg and the partner of one patient on placebo became pregnant during the study. Both delivered normal/healthy babies. Two deaths, unrelated to study drug, were reported: one patient on 210 mg astegolimab died following an SAE of asthma; the other patient on 490 mg astegolimab had an unexplained death. There were no clinically meaningful changes in laboratory parameters, vital signs, or ECG results, other than the 10% decrease in mean blood eosinophil counts in the astegolimab-treated groups, with no safety concerns. Treatment-induced ADAs were comparable between the astegolimab groups and had no impact on safety. Overall, astegolimab was well tolerated at all doses used and had a safety profile consistent with that observed in the previous astegolimab Phase I studies.  Serious Adverse Reactions Observed in Previous COPD Study: In the completed IIS Study [id number], a total of 81 patients received at least one dose of astegolimab or placebo. The safety profile of astegolimab was similar to that of placebo. There were a total of 222 AEs reported in 62 patients. A total of 28 (72%) patients in the placebo arm and 34 (81%) patients in the astegolimab arm reported at least one AE. The most commonly reported AEs were headache followed by urinary tract infection and viral upper respiratory tract infection. A total of  39 SAEs were reported in 28 (35%) patients; 16 (41%) patients in the placebo group and 12 (29%) in the astegolimab group. The most commonly reported SAE was hospital admission for community acquired pneumonia. Four SAEs resulted in patient discontinuation from treatment (1 patient in the placebo group and 3 patients in the astegolimab group). One patient in the placebo group experienced an AESI of potential MACE (heart failure, unrelated to the study treatment). No anaphylaxis or pregnancies were reported. Two deaths, unrelated to study treatment, were reported: 1 patient in the placebo group died after hospital acquired pneumonia, and another patient in the placebo group died after pneumonia and type 2 respiratory failure.  Safety Data: Astegolimab has been generally well tolerated. There have been 40 patient deaths across all astegolimab studies (Section 5.5.2), none of which were considered related to astegolimab. A total of 144 subjects experienced a total of 224 SAEs across all astegolimab studies. Of these, an SAE livedo reticularis observed in 1 patient was considered related to astegolimab by the investigator (Section 5.5.3). AEs leading to withdrawal (Section 5.5.4) have generally occurred at a rate similar to what would be expected for clinical trials in the studies' respective indications.     Xxxxx PCP Waunita Schooner, MD  This visit for Subject Maria [name] with DOB: [birth year] on 05/06/2022 for the above protocol is Visit/Encounter # consent visit  and is for purpose of research . Subject/LAR expressed continued interest and consent in continuing as a study subject. Subject thanked for participation in research and contribution to science.  S: In this consent visit it was determined that patient now has a chronic cough that is based on medical history and also orthopnea that is unexplained there is also medical history for the last few months.  Currently she is not in exacerbation.  A fully  informed consent was first obtained before doing any research procedures.     Subsequently physical exam was done by myself.  Subsequently she went through the study procedures.  She had an EKG in the QTc F was 475 msec.  Standard of care prior EKGs were all less than 470 ms and she had passed prescreen criteria.  However QTc of 475 ms would be a screen failure.  In talking to her she tells me that she has been suffering from depression and occasionally has crying spells.  Therefore primary care physician on 03/18/2022 diagnosed with mild depression and stopped was involved and started Lexapro.  Initially started 10 mg/day and then 1 or 2 weeks later which is approximately 4 weeks ago increase it to 20 mg/day.  Patient reported that this improved her mood symptoms.   Objective Vital signs done exam done.  All documented [employer] nonfocal  EKG QTcF 475 ms  A Research subject COPD with recurrent exacerbations Prolongation in QTc F from baseline to 475 ms -resulting in screen failure on the consent visit  Plan  - Screen fail and stop further study procedures -She will have to talk to primary care physician about her depression management and if the QTc F is medically concerning or not -I will alert the primary physician      SIGNATURE    Dr. Brand Males, M.D., F.C.C.P, ACRP-CPI Pulmonary and [place], [facility] [place] Staff Physician, [place] Director - Interstitial Lung Disease  Program  [facility] [place], [place], [address]   Pager: [phone], If no answer  OR between  19:00-7:00h: page [phone] Telephone (research): [phone]  11:40 AM 05/06/2022   11:40 AM 05/06/2022

## 2022-05-06 NOTE — Telephone Encounter (Signed)
Dear Maria [name] (cc Dr Damita Dunnings)  [name] -you are inheriting this patient mid November 2023 from Dr Waunita Schooner.  03/18/2022 she stopped Cymbalta and started Lexapro and somewhere in the middle of September her Lexapro dose was increased from 10 mg to 20 mg/day.  Her depression symptoms are better.  Today the patient came for a research visit and when we did an EKG her QTc F was 475 ms.  The cutoff for research participation was 470 ms and therefore she screen failed.  I wanted to bring to your attention that her QTc F has increased from baseline which was around 460 ms a few years ago.  Clinically in the ICU we normally consider of 500 ms as risky and may be for 475 ms is fine for her. Nevertheless this seems to be an increase.  I suspect the Lexapro is playing a role.  I will leave the risk versus benefit of continuing Lexapro or reducing the dose of Lexapro or trying another medication to you.  However just wanted to bring to your attention about the QTc F  When it comes to judging between research and standard of care the standard of care decisions always take precedednce.  She might be interested in rejoining cable this research study because of her frequent COPD exacerbations.  However the decision to adjust the dose on the Lexapro or change to another antidepressant to accommodate her potential wishes of rejoining the study versus managing the depression, would be a shared decision making between the 2 of you   Thank you for the consideration  Please do not hesitate to call me to discuss     SIGNATURE    Dr. Brand Males, M.D., F.C.C.P,  Pulmonary and Critical Care Medicine Staff Physician, [place] Director - Interstitial Lung Disease  Program  Medical Director - [facility] [place] at [place], [place], [address]   Pager: [phone], If no answer  -[place] or [place] [phone] Telephone (clinical office): [phone] Telephone (research): [phone]  11:46 AM 05/06/2022

## 2022-05-08 NOTE — Telephone Encounter (Signed)
[name]- thank you for your note and for your help.  I am unable to see the EKG in the chart in epic under the usual heading. Can you send [name] a copy to scan for future reference? Thanks.   =========  [place]- See below and please talk to me about this patient regarding her antidepressant use.  She has follow-up pending with you.  Thanks.

## 2022-05-09 ENCOUNTER — Ambulatory Visit: Payer: Medicare HMO | Admitting: Adult Health

## 2022-05-09 ENCOUNTER — Encounter: Payer: Self-pay | Admitting: Adult Health

## 2022-05-09 DIAGNOSIS — J9611 Chronic respiratory failure with hypoxia: Secondary | ICD-10-CM

## 2022-05-09 DIAGNOSIS — J449 Chronic obstructive pulmonary disease, unspecified: Secondary | ICD-10-CM | POA: Diagnosis not present

## 2022-05-09 DIAGNOSIS — R053 Chronic cough: Secondary | ICD-10-CM | POA: Diagnosis not present

## 2022-05-09 MED ORDER — BENZONATATE 200 MG PO CAPS: 200.0000 mg | ORAL_CAPSULE | Freq: Three times a day (TID) | ORAL | 3 refills | Status: DC | PRN

## 2022-05-09 NOTE — Telephone Encounter (Signed)
Noted. I am willing to make changes in medication but would need to evaluate her first. She can either keep the appointment she has or move it up

## 2022-05-09 NOTE — Assessment & Plan Note (Signed)
Chronic cough with recent flare.  We will add trigger prevention therapy along with cough control.  Plan Patient Instructions  Continue on BREZTRI 2 puffs Twice daily  , rinse after use.  Albuterol inhaler as needed.  Delsym 2 tsp Twice daily  for cough as needed  Tessalon Three times a day  for cough as needed  Add Claritin '10mg'$  daily  Add Chlorpheniramine '4mg'$  (Chlor tabs) At bedtime   Saline nasal spray Twice daily   Saline nasal gel At bedtime   Protonix '40mg'$  daily  Add Pepcid '20mg'$  At bedtime   Sips of water to soothe throat and avoid cough.  Continue on Oxygen 3lm at rest and 5l/m with activity .  Follow up with [name] in  2 months  and As needed   Please contact office for sooner follow up if symptoms do not improve or worsen or seek emergency care

## 2022-05-09 NOTE — Assessment & Plan Note (Signed)
Continue on oxygen 3 to 5 L.  To maintain O2 saturations greater than 88 to 90%

## 2022-05-09 NOTE — Telephone Encounter (Signed)
Patient advised. Patient will keep her appointment on 06/01/22 due to her schedule and our schedule and her leaving for the cruise

## 2022-05-09 NOTE — Assessment & Plan Note (Signed)
Moderate COPD with recent exacerbation.  Patient does have some improvement but has lingering ongoing chronic cough.  She has had this chronic cough this been worse for the last 3 years since having COVID-19 infection.  She has frequent flares.  Recent work-up with CT chest showing emphysema no acute process.  Lab work was unrevealing.  2D echo was normal. We will continue on aggressive triple therapy maintenance inhaler along with Daliresp.  We will aim treatment at trigger prevention such as postnasal drip and GERD therapy.  Along with cough control regimen  Plan  PLAN:  Patient Instructions  Continue on BREZTRI 2 puffs Twice daily  , rinse after use.  Albuterol inhaler as needed.  Delsym 2 tsp Twice daily  for cough as needed  Tessalon Three times a day  for cough as needed  Add Claritin '10mg'$  daily  Add Chlorpheniramine '4mg'$  (Chlor tabs) At bedtime   Saline nasal spray Twice daily   Saline nasal gel At bedtime   Protonix '40mg'$  daily  Add Pepcid '20mg'$  At bedtime   Sips of water to soothe throat and avoid cough.  Continue on Oxygen 3lm at rest and 5l/m with activity .  Follow up with [name] in  2 months  and As needed   Please contact office for sooner follow up if symptoms do not improve or worsen or seek emergency care

## 2022-05-09 NOTE — Patient Instructions (Addendum)
Continue on BREZTRI 2 puffs Twice daily  , rinse after use.  Albuterol inhaler as needed.  Delsym 2 tsp Twice daily  for cough as needed  Tessalon Three times a day  for cough as needed  Add Claritin '10mg'$  daily  Add Chlorpheniramine '4mg'$  (Chlor tabs) At bedtime   Saline nasal spray Twice daily   Saline nasal gel At bedtime   Protonix '40mg'$  daily  Add Pepcid '20mg'$  At bedtime   Sips of water to soothe throat and avoid cough.  Continue on Oxygen 3lm at rest and 5l/m with activity .  Follow up with [name] in  2 months  and As needed   Please contact office for sooner follow up if symptoms do not improve or worsen or seek emergency care

## 2022-05-09 NOTE — Progress Notes (Signed)
$'@Patient'b$  ID: Maria [name], female    DOB: 1956-05-15, 66 y.o.   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  11/2021 Spirometry today in the office shows stable lung function with FEV1 at 66%, ratio 87, FVC 59%.   High-resolution CT chest August 30, 2019 negative for ILD chronic parenchymal band right lung base compatible with a nonspecific postinflammatory scarring.  Moderate to severe emphysema.   CT chest September 04, 2020 moderate emphysema.   Alpha 1 MZ, Level 103-125.   05/09/2022 Follow up : COPD, O2 RF  Patient presents for 1 month follow-up.  Patient has underlying moderate COPD with emphysema.  She remains on oxygen 3 L rest and 5l/m activity .   Patient says overall breathing is doing about the same.  Because she gets short of breath with minimal activities.  She remains on Breztri inhaler twice daily.  She is on Daliresp daily.  She has frequent COPD exacerbation and a chronic cough.  Was treated for COPD flare last visit with doxycycline and a prednisone taper.  She says that she did get some better but cough continues to be severe in nature keeps her up at nighttime.  She is on Delsym over-the-counter without much relief.  She denies any fever, chest pain, orthopnea, edema.  She does have nasal congestion  and postnasal drip.  She was set up for 2D echo last visit that was normal, EF 60-65%, RV SF normal, RV size normal Sputum culture was negative and showed routine respiratory flora.  Lab work was unrevealing.  QuantiFERON gold was negative, absolute eosinophil count was 100.  CT chest showed moderate emphysema with no acute process.  Did not go for Neck surgery felt to be too high risk. Going to pain management .  Drives and independent with ADLs. Going on cruise next month .   Allergies  Allergen Reactions   Montelukast     Bad dreams    Immunization History  Administered Date(s) Administered   Fluad Quad(high Dose 65+) 03/30/2022   Influenza, High Dose Seasonal PF 03/30/2022   Influenza, Seasonal, Injecte, Preservative Fre 04/28/2008, 04/09/2010   Influenza,inj,Quad PF,6+ Mos 04/22/2019, 04/25/2020, 03/15/2021   Influenza-Unspecified 06/01/2008, 05/10/2016, 03/18/2017   Moderna Sars-Covid-2 Vaccination 07/30/2019, 08/28/2019, 06/27/2020   Pneumococcal Polysaccharide-23 04/15/2015, 04/22/2020   Td 10/01/2020   Tdap 11/17/2009   Zoster Recombinat (Shingrix) 04/16/2020, 06/22/2020    Past Medical History:  Diagnosis Date   Atherosclerosis [id number]/0612014   aorta, iliacs and CFA bilaterally no greater than 0-49% - lower arterial doppler.   Blood transfusion without reported diagnosis 2005   Cancer [facility]) Ovarian 1978   Chest pain    COPD (chronic obstructive pulmonary disease) ([place])    COVID-19 long hauler manifesting chronic dyspnea    Emphysema of lung ([place]) 2019   Oxygen deficiency 03/2019   Oxygen dependent    3 liters  up to 5    Tobacco History: Social History   Tobacco Use  Smoking Status Former   Packs/day: 1.00   Years: 45.00   Total pack years: 45.00   Types: Cigarettes   Quit date: 03/18/2016   Years since quitting: 6.1  Smokeless Tobacco Never   Counseling given: Not Answered   Outpatient Medications Prior to Visit  Medication Sig Dispense Refill    albuterol (VENTOLIN HFA) 108 (90 Base) MCG/ACT inhaler TAKE 2 PUFFS BY MOUTH EVERY 6 HOURS AS NEEDED FOR WHEEZE OR SHORTNESS OF BREATH 8.5 each 5   ASPIRIN LOW DOSE 81 MG EC tablet TAKE 1 TABLET BY MOUTH EVERY DAY 90 tablet 3   atorvastatin (LIPITOR) 10 MG tablet TAKE 1 TABLET BY MOUTH EVERY DAY 90 tablet 2   Azelastine HCl 137 MCG/SPRAY SOLN USE 1 SPRAY INTO EACH NOSTRIL TWICE A DAY 30 mL 1   Budeson-Glycopyrrol-Formoterol (BREZTRI AEROSPHERE) 160-9-4.8 MCG/ACT AERO Inhale 2 puffs into the lungs in the morning and at bedtime. 10.7 g 11   escitalopram (LEXAPRO) 20 MG tablet Take 1 tablet (20 mg total) by mouth daily. 90 tablet 1   gabapentin (NEURONTIN) 600 MG tablet Take 1 tablet (600 mg total) by mouth 3 (three) times daily. 90 tablet 0   ipratropium-albuterol (DUONEB) 0.5-2.5 (3) MG/3ML SOLN USE 1 AMPULE IN NEBULIZER 4 TIMES A DAY AS NEEDED (DX: J84.112, J44.9) 360 mL 5   oxybutynin (DITROPAN-XL) 10 MG 24 hr tablet Take 10 mg by mouth daily.     OXYGEN Inhale into the lungs. 3 liters at rest and 5 liters with any activity.-     pantoprazole (PROTONIX) 40 MG tablet Take 1 tablet (40 mg total) by mouth daily. 30 tablet 11   Respiratory Therapy Supplies (FLUTTER) DEVI Use as directed 1 each 0   roflumilast (DALIRESP) 500 MCG TABS tablet Take 1 tablet (500 mcg total) by mouth daily. 30 tablet 11   gabapentin (NEURONTIN) 400 MG capsule Take 1 capsule (400 mg total) by mouth 3 (three) times daily. 90 capsule 0   No facility-administered medications prior to visit.     Review of Systems:   Constitutional:   No  weight loss, night sweats,  Fevers, chills, fatigue, or  lassitude.  HEENT:   No headaches,  Difficulty swallowing,  Tooth/dental problems, or  Sore throat,                No sneezing, itching, ear ache, nasal congestion, post nasal drip,   CV:  No chest pain,  Orthopnea, PND, swelling in lower extremities, anasarca, dizziness, palpitations, syncope.   GI  No heartburn, indigestion,  abdominal pain, nausea, vomiting, diarrhea, change in bowel habits, loss of appetite, bloody stools.   Resp: No shortness of breath with exertion or at rest.  No excess mucus, no productive cough,  No non-productive cough,  No coughing up of blood.  No change in color of mucus.  No wheezing.  No chest wall deformity  Skin: no rash or lesions.  GU: no dysuria, change in color of urine, no urgency or frequency.  No flank pain, no hematuria   MS:  No joint pain or swelling.  No decreased range of motion.  No back pain.    Physical Exam  BP 130/60 (BP Location: Left Arm, Patient Position: Sitting, Cuff Size: Normal)   Pulse 85   Temp 97.9 F (36.6 C) (Oral)   Ht '5\' 5"'$  (1.651 m)   Wt 148 lb (67.1 kg)  SpO2 99%   BMI 24.63 kg/m   GEN: A/Ox3; pleasant , NAD, well nourished    HEENT:  [place]/AT,  EACs-clear, TMs-wnl, NOSE-clear, THROAT-clear, no lesions, no postnasal drip or exudate noted.   NECK:  Supple w/ fair ROM; no JVD; normal carotid impulses w/o bruits; no thyromegaly or nodules palpated; no lymphadenopathy.    RESP  Clear  P & A; w/o, wheezes/ rales/ or rhonchi. no accessory muscle use, no dullness to percussion  CARD:  RRR, no m/r/g, no peripheral edema, pulses intact, no cyanosis or clubbing.  GI:   Soft & nt; nml bowel sounds; no organomegaly or masses detected.   Musco: Warm bil, no deformities or joint swelling noted.   Neuro: alert, no focal deficits noted.    Skin: Warm, no lesions or rashes    Lab Results:  CBC    Component Value Date/Time   WBC 9.5 03/31/2022 1019   RBC 4.07 03/31/2022 1019   HGB 12.9 03/31/2022 1019   HGB 13.6 08/22/2019 1359   HCT 38.8 03/31/2022 1019   HCT 39.3 08/22/2019 1359   PLT 334.0 03/31/2022 1019   PLT 381 08/22/2019 1359   MCV 95.4 03/31/2022 1019   MCV 91 08/22/2019 1359   MCH 31.6 08/22/2019 1359   MCH 31.0 03/03/2019 0154   MCHC 33.3 03/31/2022 1019   RDW 13.5 03/31/2022 1019   RDW 12.5 08/22/2019 1359   LYMPHSABS  2.3 03/31/2022 1019   LYMPHSABS 2.5 08/22/2019 1359   MONOABS 0.6 03/31/2022 1019   EOSABS 0.1 03/31/2022 1019   EOSABS 0.0 08/22/2019 1359   BASOSABS 0.0 03/31/2022 1019   BASOSABS 0.0 08/22/2019 1359    BMET    Component Value Date/Time   NA 138 03/31/2022 1019   K 3.8 03/31/2022 1019   CL 101 03/31/2022 1019   CO2 28 03/31/2022 1019   GLUCOSE 89 03/31/2022 1019   BUN 10 03/31/2022 1019   CREATININE 0.69 03/31/2022 1019   CALCIUM 9.4 03/31/2022 1019   GFRNONAA >60 03/03/2019 0154   GFRAA >60 03/03/2019 0154    BNP No results found for: "BNP"  ProBNP    Component Value Date/Time   PROBNP 36.0 03/31/2022 1019    Imaging: ECHOCARDIOGRAM COMPLETE  Result Date: 04/27/2022    ECHOCARDIOGRAM REPORT   Patient Name:   Maria [name] Date of Exam: 04/27/2022 Medical Rec #:  [id number]      Height:       65.0 in Accession #:    [id number]     Weight:       151.4 lb Date of Birth:  1955/08/14       BSA:          1.757 m Patient Age:    24 years       BP:           130/52 mmHg Patient Gender: F              HR:           74 bpm. Exam Location:  [facility] Procedure: 2D Echo, Color Doppler and Cardiac Doppler Indications:     Dyspnea R06.00  History:         Patient has prior history of Echocardiogram examinations, most                  recent 09/09/2019. COPD; Signs/Symptoms:Chest Pain.  Sonographer:     Sherrie Sport Referring Phys:  [id number] Brand Males Diagnosing Phys: Kate Sable MD  Sonographer Comments: Image acquisition challenging due to COPD. IMPRESSIONS  1. Left ventricular ejection fraction, by estimation, is 60 to 65%. The left ventricle has normal function. The left ventricle has no regional wall motion abnormalities. Left ventricular diastolic parameters were normal.  2. Right ventricular systolic function is normal. The right ventricular size is normal.  3. The mitral valve is normal in structure. No evidence of mitral valve regurgitation.  4. The aortic valve was not well  visualized. Aortic valve regurgitation is not visualized.  5. The inferior vena cava is normal in size with greater than 50% respiratory variability, suggesting right atrial pressure of 3 mmHg. FINDINGS  Left Ventricle: Left ventricular ejection fraction, by estimation, is 60 to 65%. The left ventricle has normal function. The left ventricle has no regional wall motion abnormalities. The left ventricular internal cavity size was normal in size. There is  no left ventricular hypertrophy. Left ventricular diastolic parameters were normal. Right Ventricle: The right ventricular size is normal. No increase in right ventricular wall thickness. Right ventricular systolic function is normal. Left Atrium: Left atrial size was normal in size. Right Atrium: Right atrial size was normal in size. Pericardium: There is no evidence of pericardial effusion. Mitral Valve: The mitral valve is normal in structure. No evidence of mitral valve regurgitation. Tricuspid Valve: The tricuspid valve is grossly normal. Tricuspid valve regurgitation is not demonstrated. Aortic Valve: The aortic valve was not well visualized. Aortic valve regurgitation is not visualized. Aortic valve mean gradient measures 3.0 mmHg. Aortic valve peak gradient measures 4.6 mmHg. Aortic valve area, by VTI measures 2.92 cm. Pulmonic Valve: The pulmonic valve was not well visualized. Pulmonic valve regurgitation is not visualized. Aorta: The aortic root is normal in size and structure. Venous: The inferior vena cava is normal in size with greater than 50% respiratory variability, suggesting right atrial pressure of 3 mmHg. IAS/Shunts: No atrial level shunt detected by color flow Doppler.  LEFT VENTRICLE PLAX 2D LVIDd:         4.00 cm   Diastology LVIDs:         2.30 cm   LV e' medial:    10.10 cm/s LV PW:         0.90 cm   LV E/e' medial:  9.5 LV IVS:        0.80 cm   LV e' lateral:   12.80 cm/s LVOT diam:     2.20 cm   LV E/e' lateral: 7.5 LV SV:         65 LV  SV Index:   37 LVOT Area:     3.80 cm  RIGHT VENTRICLE RV Basal diam:  3.30 cm RV Mid diam:    3.20 cm RV S prime:     13.30 cm/s TAPSE (M-mode): 2.8 cm LEFT ATRIUM             Index        RIGHT ATRIUM          Index LA diam:        2.50 cm 1.42 cm/m   RA Area:     7.13 cm LA Vol (A2C):   26.7 ml 15.19 ml/m  RA Volume:   11.10 ml 6.32 ml/m LA Vol (A4C):   21.2 ml 12.06 ml/m LA Biplane Vol: 23.9 ml 13.60 ml/m  AORTIC VALVE AV Area (Vmax):    2.76 cm AV Area (Vmean):   2.67 cm AV Area (VTI):     2.92 cm AV  Vmax:           107.00 cm/s AV Vmean:          73.800 cm/s AV VTI:            0.224 m AV Peak Grad:      4.6 mmHg AV Mean Grad:      3.0 mmHg LVOT Vmax:         77.60 cm/s LVOT Vmean:        51.900 cm/s LVOT VTI:          0.172 m LVOT/AV VTI ratio: 0.77  AORTA Ao Root diam: 2.90 cm MITRAL VALVE               TRICUSPID VALVE MV Area (PHT): 3.60 cm    TR Peak grad:   10.1 mmHg MV Decel Time: 211 msec    TR Vmax:        159.00 cm/s MV E velocity: 96.40 cm/s MV A velocity: 79.10 cm/s  SHUNTS MV E/A ratio:  1.22        Systemic VTI:  0.17 m                            Systemic Diam: 2.20 cm Kate Sable MD Electronically signed by Kate Sable MD Signature Date/Time: 04/27/2022/5:42:20 PM    Final          Latest Ref Rng & Units 11/12/2020   10:43 AM 01/16/2020   10:57 AM 06/24/2019    8:45 AM  PFT Results  FVC-Pre L 2.94  3.17  2.93   FVC-Predicted Pre % 84  90  83   FVC-Post L 3.18  3.25  3.39   FVC-Predicted Post % 91  92  96   Pre FEV1/FVC % % 54  55  56   Post FEV1/FCV % % 54  56  59   FEV1-Pre L 1.60  1.75  1.64   FEV1-Predicted Pre % 59  65  60   FEV1-Post L 1.71  1.83  1.99   DLCO uncorrected ml/min/mmHg 8.06  10.81  12.24   DLCO UNC% % 37  50  57   DLCO corrected ml/min/mmHg 8.06  11.29    DLCO COR %Predicted % 37  52    DLVA Predicted % 57  61  60   TLC L 4.89  5.25  5.81   TLC % Predicted % 91  98  108   RV % Predicted % 88  86  110     No results found for:  "NITRICOXIDE"      Assessment & Plan:   Stage 2 moderate COPD by GOLD classification (HCC) Moderate COPD with recent exacerbation.  Patient does have some improvement but has lingering ongoing chronic cough.  She has had this chronic cough this been worse for the last 3 years since having COVID-19 infection.  She has frequent flares.  Recent work-up with CT chest showing emphysema no acute process.  Lab work was unrevealing.  2D echo was normal. We will continue on aggressive triple therapy maintenance inhaler along with Daliresp.  We will aim treatment at trigger prevention such as postnasal drip and GERD therapy.  Along with cough control regimen  Plan  PLAN:  Patient Instructions  Continue on BREZTRI 2 puffs Twice daily  , rinse after use.  Albuterol inhaler as needed.  Delsym 2 tsp Twice daily  for cough as needed  Tessalon Three times  a day  for cough as needed  Add Claritin '10mg'$  daily  Add Chlorpheniramine '4mg'$  (Chlor tabs) At bedtime   Saline nasal spray Twice daily   Saline nasal gel At bedtime   Protonix '40mg'$  daily  Add Pepcid '20mg'$  At bedtime   Sips of water to soothe throat and avoid cough.  Continue on Oxygen 3lm at rest and 5l/m with activity .  Follow up with [name] in  2 months  and As needed   Please contact office for sooner follow up if symptoms do not improve or worsen or seek emergency care      Chronic respiratory failure with hypoxia ([place]) Continue on oxygen 3 to 5 L.  To maintain O2 saturations greater than 88 to 90%  Chronic cough Chronic cough with recent flare.  We will add trigger prevention therapy along with cough control.  Plan Patient Instructions  Continue on BREZTRI 2 puffs Twice daily  , rinse after use.  Albuterol inhaler as needed.  Delsym 2 tsp Twice daily  for cough as needed  Tessalon Three times a day  for cough as needed  Add Claritin '10mg'$  daily  Add Chlorpheniramine '4mg'$  (Chlor tabs) At bedtime   Saline nasal spray Twice  daily   Saline nasal gel At bedtime   Protonix '40mg'$  daily  Add Pepcid '20mg'$  At bedtime   Sips of water to soothe throat and avoid cough.  Continue on Oxygen 3lm at rest and 5l/m with activity .  Follow up with [name] in  2 months  and As needed   Please contact office for sooner follow up if symptoms do not improve or worsen or seek emergency care        Rexene Edison, NP 05/09/2022

## 2022-05-16 ENCOUNTER — Encounter (INDEPENDENT_AMBULATORY_CARE_PROVIDER_SITE_OTHER): Payer: Self-pay

## 2022-05-17 ENCOUNTER — Ambulatory Visit
Payer: Medicare HMO | Attending: Student in an Organized Health Care Education/Training Program | Admitting: Student in an Organized Health Care Education/Training Program

## 2022-05-17 ENCOUNTER — Encounter: Payer: Self-pay | Admitting: Student in an Organized Health Care Education/Training Program

## 2022-05-17 VITALS — BP 125/59 | HR 72 | Temp 97.2°F | Resp 16 | Ht 65.0 in | Wt 148.0 lb

## 2022-05-17 DIAGNOSIS — M25511 Pain in right shoulder: Secondary | ICD-10-CM | POA: Insufficient documentation

## 2022-05-17 DIAGNOSIS — M5010 Cervical disc disorder with radiculopathy, unspecified cervical region: Secondary | ICD-10-CM

## 2022-05-17 DIAGNOSIS — Q761 Klippel-Feil syndrome: Secondary | ICD-10-CM | POA: Diagnosis not present

## 2022-05-17 DIAGNOSIS — G8929 Other chronic pain: Secondary | ICD-10-CM | POA: Diagnosis not present

## 2022-05-17 DIAGNOSIS — G894 Chronic pain syndrome: Secondary | ICD-10-CM

## 2022-05-17 DIAGNOSIS — J441 Chronic obstructive pulmonary disease with (acute) exacerbation: Secondary | ICD-10-CM

## 2022-05-17 DIAGNOSIS — Z9889 Other specified postprocedural states: Secondary | ICD-10-CM

## 2022-05-17 MED ORDER — GABAPENTIN 800 MG PO TABS: 800.0000 mg | ORAL_TABLET | Freq: Three times a day (TID) | ORAL | 2 refills | Status: DC

## 2022-05-17 NOTE — Progress Notes (Signed)
PROVIDER NOTE: Information contained herein reflects review and annotations entered in association with encounter. Interpretation of such information and data should be left to medically-trained personnel. Information provided to patient can be located elsewhere in the medical record under "Patient Instructions". Document created using STT-dictation technology, any transcriptional errors that may result from process are unintentional.    Patient: Maria [name]  Service Category: E/M  Provider: Gillis Santa, MD  DOB: 1956/05/09  DOS: 05/17/2022  Referring Provider: Waunita Schooner, MD  MRN: [id number]  Specialty: Interventional Pain Management  PCP: Waunita Schooner, MD  Type: Established Patient  Setting: Ambulatory outpatient    Location: Office  Delivery: Face-to-face     HPI  [name], a 66 y.o. year old female, is here today because of her Cervical fusion syndrome [Q76.1]. [name] primary complain today is Neck Pain (right) Last encounter: My last encounter with her was on 04/07/2022. Pertinent problems: [name] has Anxiety; Numbness and tingling in both hands; History of cervical spinal surgery; Radicular pain in right arm; Cervical fusion syndrome (C6-7); Cervical disc disorder with radiculopathy of cervical region (right); Requires continuous at home supplemental oxygen (3L)  COPD; and Chronic pain syndrome on their pertinent problem list. Pain Assessment: Severity of Chronic pain is reported as a 7 /10. Location: Neck Right/down to shoulder and down arm effects all fingers. Onset: More than a month ago. Quality: Aching, Burning, Tingling, Discomfort. Timing: Intermittent. Modifying factor(s): Nothing. Vitals:  height is _0  (1.651 m) and weight is 148 lb (67.1 kg). Her temperature is 97.2 F (36.2 C) (abnormal). Her blood pressure is 125/59 (abnormal) and her pulse is 72. Her respiration is 16 and oxygen saturation is 98%.   Reason for encounter:   Patient follows up today endorsing  any significant pain relief functional benefit after gabapentin titration to 600 mg 3 times a day.  She is not having effects including vision changes, lower extremity swelling, changes, sedation.  We discussed increasing her dose to 800 mg 3 times a day.  Consider transition to Lyrica.  She is leaving for a cruise this weekend.  She states that her husband is retiring tomorrow and they are very excited about that.  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  She describes severe difficulty in cervical lateral motion and cervical extension.  She is on home oxygen.  She has COPD.  She is on gabapentin 300 mg 3 times a day.  She would like to avoid any opioid analgesics or sedative medications.  She has had trigger point injections in the past as well as physical therapy.  She has tried to do home physical therapy exercises but they have proved too difficult and have increased her pain.  MRI findings are below.  She also has a history of depression.  ROS  Constitutional: Denies any fever or chills Gastrointestinal: No reported hemesis, hematochezia, vomiting, or acute GI distress Musculoskeletal:  Right arm pain Neurological: No reported episodes of acute onset apraxia, aphasia, dysarthria, agnosia, amnesia, paralysis, loss of coordination, or loss of consciousness  Medication Review  Azelastine HCl, Budeson-Glycopyrrol-Formoterol, Flutter, Oxygen-Helium, albuterol, aspirin EC, atorvastatin, benzonatate, escitalopram, gabapentin, ipratropium-albuterol, oxybutynin, pantoprazole, and roflumilast  History Review  Allergy: Maria [name] is allergic to montelukast. Drug: Maria [name]  reports no history of drug use. Alcohol:  reports no history of alcohol  use. Tobacco:  reports that she quit smoking about 6 years ago. Her smoking use included  cigarettes. She has a 45.00 pack-year smoking history. She has never used smokeless tobacco. Social: Maria [name]  reports that she quit smoking about 6 years ago. Her smoking use included cigarettes. She has a 45.00 pack-year smoking history. She has never used smokeless tobacco. She reports that she does not drink alcohol and does not use drugs. Medical:  has a past medical history of Atherosclerosis (1/0612014), Blood transfusion without reported diagnosis (2005), Cancer ([place]) (Ovarian 1978), Chest pain, COPD (chronic obstructive pulmonary disease) ([place]), COVID-19 long hauler manifesting chronic dyspnea, Emphysema of lung ([place]) (2019), Oxygen deficiency (03/2019), and Oxygen dependent. Surgical: Maria [name]  has a past surgical history that includes Abdominal hysterectomy (1978); Ovarian cyst removal (1988); Neck surgery (1999); Spine surgery (Neck 2002); Cholecystectomy (2010); Colonoscopy; Colonoscopy; Esophagogastroduodenoscopy; and Colonoscopy with propofol (N/A, 11/01/2021). Family: family history includes Arthritis in her mother; Asthma in her son; Birth defects in her son; Breast cancer in her maternal aunt; COPD in her brother, brother, and father; Diabetes in her mother and son; Hearing loss in her father; Heart disease in her mother; Heart failure in her mother; Hypertension in her mother; Kidney disease in her mother; Lung cancer in her brother; Obesity in her mother.  Laboratory Chemistry Profile   Renal Lab Results  Component Value Date   BUN 10 03/31/2022   CREATININE 0.69 03/31/2022   GFR 91.27 03/31/2022   GFRAA >60 03/03/2019   GFRNONAA >60 03/03/2019    Hepatic Lab Results  Component Value Date   AST 20 10/15/2021   ALT 17 10/15/2021   ALBUMIN 4.2 10/15/2021   ALKPHOS 72 10/15/2021   LIPASE 19 11/09/2016    Electrolytes Lab Results  Component Value Date   NA 138 03/31/2022   K 3.8 03/31/2022   CL 101 03/31/2022   CALCIUM 9.4 03/31/2022   MG 2.0 03/19/2019   PHOS 3.4  03/19/2019    Bone Lab Results  Component Value Date   VD25OH 35.22 06/03/2019    Inflammation (CRP: Acute Phase) (ESR: Chronic Phase) Lab Results  Component Value Date   CRP 1.3 (H) 03/03/2019   ESRSEDRATE 41 (H) 07/03/2019         Note: Above Lab results reviewed.  Recent Imaging Review  ECHOCARDIOGRAM COMPLETE    ECHOCARDIOGRAM REPORT       Patient Name:   DONETTA ISAZA Date of Exam: 04/27/2022 Medical Rec #:  [id number]      Height:       65.0 in Accession #:    [id number]     Weight:       151.4 lb Date of Birth:  [birth year]       BSA:          1.757 m Patient Age:    35 years       BP:           130/52 mmHg Patient Gender: F              HR:           74 bpm. Exam Location:  [facility]  Procedure: 2D Echo, Color Doppler and Cardiac Doppler  Indications:     Dyspnea R06.00   History:         Patient has prior history of Echocardiogram examinations, most                  recent 09/09/2019. COPD;  Signs/Symptoms:Chest Pain.   Sonographer:     Sherrie Sport Referring Phys:  [id number] Males Diagnosing Phys: Kate Sable MD    Sonographer Comments: Image acquisition challenging due to COPD. IMPRESSIONS   1. Left ventricular ejection fraction, by estimation, is 60 to 65%. The left ventricle has normal function. The left ventricle has no regional wall motion abnormalities. Left ventricular diastolic parameters were normal.  2. Right ventricular systolic function is normal. The right ventricular size is normal.  3. The mitral valve is normal in structure. No evidence of mitral valve regurgitation.  4. The aortic valve was not well visualized. Aortic valve regurgitation is not visualized.  5. The inferior vena cava is normal in size with greater than 50% respiratory variability, suggesting right atrial pressure of 3 mmHg.  FINDINGS  Left Ventricle: Left ventricular ejection fraction, by estimation, is 60 to 65%. The left ventricle has normal function. The left ventricle  has no regional wall motion abnormalities. The left ventricular internal cavity size was normal in size. There is  no left ventricular hypertrophy. Left ventricular diastolic parameters were normal.  Right Ventricle: The right ventricular size is normal. No increase in right ventricular wall thickness. Right ventricular systolic function is normal.  Left Atrium: Left atrial size was normal in size.  Right Atrium: Right atrial size was normal in size.  Pericardium: There is no evidence of pericardial effusion.  Mitral Valve: The mitral valve is normal in structure. No evidence of mitral valve regurgitation.  Tricuspid Valve: The tricuspid valve is grossly normal. Tricuspid valve regurgitation is not demonstrated.  Aortic Valve: The aortic valve was not well visualized. Aortic valve regurgitation is not visualized. Aortic valve mean gradient measures 3.0 mmHg. Aortic valve peak gradient measures 4.6 mmHg. Aortic valve area, by VTI measures 2.92 cm.  Pulmonic Valve: The pulmonic valve was not well visualized. Pulmonic valve regurgitation is not visualized.  Aorta: The aortic root is normal in size and structure.  Venous: The inferior vena cava is normal in size with greater than 50% respiratory variability, suggesting right atrial pressure of 3 mmHg.  IAS/Shunts: No atrial level shunt detected by color flow Doppler.    LEFT VENTRICLE PLAX 2D LVIDd:         4.00 cm   Diastology LVIDs:         2.30 cm   LV e' medial:    10.10 cm/s LV PW:         0.90 cm   LV E/e' medial:  9.5 LV IVS:        0.80 cm   LV e' lateral:   12.80 cm/s LVOT diam:     2.20 cm   LV E/e' lateral: 7.5 LV SV:         65 LV SV Index:   37 LVOT Area:     3.80 cm    RIGHT VENTRICLE RV Basal diam:  3.30 cm RV Mid diam:    3.20 cm RV S prime:     13.30 cm/s TAPSE (M-mode): 2.8 cm  LEFT ATRIUM             Index        RIGHT ATRIUM          Index LA diam:        2.50 cm 1.42 cm/m   RA Area:     7.13 cm LA  Vol (A2C):   26.7 ml 15.19 ml/m  RA Volume:   11.10 ml 6.32 ml/m LA  Vol (A4C):   21.2 ml 12.06 ml/m LA Biplane Vol: 23.9 ml 13.60 ml/m  AORTIC VALVE AV Area (Vmax):    2.76 cm AV Area (Vmean):   2.67 cm AV Area (VTI):     2.92 cm AV Vmax:           107.00 cm/s AV Vmean:          73.800 cm/s AV VTI:            0.224 m AV Peak Grad:      4.6 mmHg AV Mean Grad:      3.0 mmHg LVOT Vmax:         77.60 cm/s LVOT Vmean:        51.900 cm/s LVOT VTI:          0.172 m LVOT/AV VTI ratio: 0.77   AORTA Ao Root diam: 2.90 cm  MITRAL VALVE               TRICUSPID VALVE MV Area (PHT): 3.60 cm    TR Peak grad:   10.1 mmHg MV Decel Time: 211 msec    TR Vmax:        159.00 cm/s MV E velocity: 96.40 cm/s MV A velocity: 79.10 cm/s  SHUNTS MV E/A ratio:  1.22        Systemic VTI:  0.17 m                            Systemic Diam: 2.20 cm  Kate Sable MD Electronically signed by Kate Sable MD Signature Date/Time: 04/27/2022/5:42:20 PM      Final   Note: Reviewed        Physical Exam  General appearance: Well nourished, well developed, and well hydrated. In no apparent acute distress Mental status: Alert, oriented x 3 (person, place, & time)        Eyes: PERLA Vitals: BP (!) 125/59   Pulse 72   Temp (!) 97.2 F (36.2 C)   Resp 16   Ht _0  (1.651 m)   Wt 148 lb (67.1 kg)   SpO2 98% Comment: 3l pts  BMI 24.63 kg/m  BMI: Estimated body mass index is 24.63 kg/m as calculated from the following:   Height as of this encounter: _1  (1.651 m).   Weight as of this encounter: 148 lb (67.1 kg). Ideal: Ideal body weight: 57 kg (125 lb 10.6 oz) Adjusted ideal body weight: 61.1 kg (134 lb 9.6 oz)  Respiratory: Oxygen-dependent COPD 3 L oxygen continuous   Cervical Spine Area Exam  Skin & Axial Inspection: Well healed scar from previous spine surgery detected Alignment: Symmetrical Functional ROM: Pain restricted ROM     severely limited on the right Stability: No  instability detected Muscle Tone/Strength: Functionally intact. No obvious neuro-muscular anomalies detected. Sensory (Neurological): Neurogenic pain pattern Palpation: No palpable anomalies             Positive Spurling's on the right   Upper Extremity (UE) Exam      Side: Right upper extremity   Side: Left upper extremity  Skin & Extremity Inspection: Skin color, temperature, and hair growth are WNL. No peripheral edema or cyanosis. No masses, redness, swelling, asymmetry, or associated skin lesions. No contractures.   Skin & Extremity Inspection: Skin color, temperature, and hair growth are WNL. No peripheral edema or cyanosis. No masses, redness, swelling, asymmetry, or associated skin lesions. No contractures.  Functional ROM: Pain  restricted ROM for shoulder and elbow   Functional ROM: Unrestricted ROM          Muscle Tone/Strength: Functionally intact. No obvious neuro-muscular anomalies detected.   Muscle Tone/Strength: Functionally intact. No obvious neuro-muscular anomalies detected.  Sensory (Neurological): Neurogenic pain pattern           Sensory (Neurological): Unimpaired          Palpation: No palpable anomalies               Palpation: No palpable anomalies              Provocative Test(s):  Phalen's test: deferred Tinel's test: deferred Apley's scratch test (touch opposite shoulder):  Action 1 (Across chest): Decreased ROM Action 2 (Overhead): Decreased ROM Action 3 (LB reach): Decreased ROM     Provocative Test(s):  Phalen's test: deferred Tinel's test: deferred Apley's scratch test (touch opposite shoulder):  Action 1 (Across chest): deferred Action 2 (Overhead): deferred Action 3 (LB reach): deferred         Assessment   Diagnosis Status  1. Cervical fusion syndrome (C6-7)   2. Chronic right shoulder pain   3. Cervical disc disorder with radiculopathy of cervical region (right)   4. COPD exacerbation ([place])   5. History of cervical spinal surgery   6. Chronic  pain syndrome    Persistent Persistent Persistent   Updated Problems: Problem  Cervical fusion syndrome (C6-7)   Not a surgical candidate   Cervical disc disorder with radiculopathy of cervical region (right)  Requires continuous at home supplemental oxygen (3L)  COPD  Chronic Pain Syndrome  History of Cervical Spinal Surgery  Radicular Pain in Right Arm  Numbness and Tingling in Both Hands  Anxiety     Plan of Care    Ms. TAREA SKILLMAN has a current medication list which includes the following long-term medication(s): albuterol, atorvastatin, azelastine hcl, escitalopram, gabapentin, ipratropium-albuterol, pantoprazole, and roflumilast.  Maria [name] is a pleasant 66 year old female who presents with a chief complaint of cervical spine pain with radiation into her right arm down to her thumb and index finger predominantly.  She has severely limited range of motion.  She has a history of cervical spinal fusion at C6-C7.  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  She has done home directed physical therapy exercises in the past but due to pain and severely limited range of motion, she was unable to participate in them completely.  She is on 3 L of home oxygen due to COPD.  She is high risk for surgery and not deemed a surgical candidate.   At her initial clinic visit, her gabapentin was increased to 600 mg 3 times a day which she states is not helping.  She is not endorsing any side effects so we will increase dose further to 800 mg 3 times a day.  I also reviewed her x-rays.  She has mild glenohumeral arthritis in both shoulders.  She has multilevel cervical degenerative disc disease with endplate changes.  I discussed an interlaminar cervical epidural steroid injection for right cervical radicular pain.  I also discussed diagnostic cervical facet medial branch nerve blocks for cervical generative  disc disease and cervical arthropathy   Pharmacotherapy (Medications Ordered): Meds ordered this encounter  Medications   gabapentin (NEURONTIN) 800 MG tablet    Sig: Take 1 tablet (800 mg total) by mouth 3 (three) times daily.  Dispense:  90 tablet    Refill:  2   Orders:  No orders of the defined types were placed in this encounter.  Follow-up plan:   Return in about 3 months (around 08/17/2022) for Medication Management, in person.     Future interventional options: -Right cervical ESI -Cervical facet medial branch nerve blocks -Glenohumeral joint injection -Suprascapular nerve block  Also consider trial of Lyrica   Recent Visits Date Type Provider Dept  04/07/22 Office Visit Gillis Santa, MD [facility]  Showing recent visits within past 90 days and meeting all other requirements Today's Visits Date Type Provider Dept  05/17/22 Office Visit Gillis Santa, MD [facility]  Showing today's visits and meeting all other requirements Future Appointments Date Type Provider Dept  08/09/22 Appointment Gillis Santa, MD [facility]  Showing future appointments within next 90 days and meeting all other requirements  I discussed the assessment and treatment plan with the patient. The patient was provided an opportunity to ask questions and all were answered. The patient agreed with the plan and demonstrated an understanding of the instructions.  Patient advised to call back or seek an in-person evaluation if the symptoms or condition worsens.  Duration of encounter: 26mnutes.  Total time on encounter, as per AMA guidelines included both the face-to-face and non-face-to-face time personally spent by the physician and/or other qualified health care professional(s) on the day of the encounter (includes time in activities that require the physician or other qualified health care professional and does not include time in activities normally performed by  clinical staff). Physician's time may include the following activities when performed: preparing to see the patient (eg, review of tests, pre-charting review of records) obtaining and/or reviewing separately obtained history performing a medically appropriate examination and/or evaluation counseling and educating the patient/family/caregiver ordering medications, tests, or procedures referring and communicating with other health care professionals (when not separately reported) documenting clinical information in the electronic or other health record independently interpreting results (not separately reported) and communicating results to the patient/ family/caregiver care coordination (not separately reported)  Note by: BGillis Santa MD Date: 05/17/2022; Time: 8:46 AM

## 2022-05-17 NOTE — Progress Notes (Signed)
Safety precautions to be maintained throughout the outpatient stay will include: orient to surroundings, keep bed in low position, maintain call bell within reach at all times, provide assistance with transfer out of bed and ambulation.   Did not bring medications with her today. Instructed to bring next appt.

## 2022-05-17 NOTE — Patient Instructions (Signed)

## 2022-05-24 DIAGNOSIS — J449 Chronic obstructive pulmonary disease, unspecified: Secondary | ICD-10-CM | POA: Diagnosis not present

## 2022-05-24 DIAGNOSIS — J9611 Chronic respiratory failure with hypoxia: Secondary | ICD-10-CM | POA: Diagnosis not present

## 2022-05-30 ENCOUNTER — Other Ambulatory Visit: Payer: Self-pay | Admitting: Family Medicine

## 2022-05-30 ENCOUNTER — Telehealth: Payer: Self-pay

## 2022-05-30 MED ORDER — OXYBUTYNIN CHLORIDE ER 10 MG PO TB24: 10.0000 mg | ORAL_TABLET | Freq: Every day | ORAL | 0 refills | Status: DC

## 2022-05-30 NOTE — Telephone Encounter (Signed)
Spoke to patient and she scheduled a follow up appointment. She wants to discuss PTNS and also get refills. I sent in 30 days of Oxybutynin to the pharmacy to get her through until appointment.

## 2022-05-30 NOTE — Telephone Encounter (Signed)
Message left on triage line- 127pm  [address] pharmacy called asking if pts  rx for Oxybutiynin 10 mg 1qd can be sent in. Last refill 7/31- # 90 at [facility]. Pt is transferring all meds to [facility].   Pls advise.

## 2022-05-30 NOTE — Telephone Encounter (Signed)
.  left message to have patient return my call.  Pt needs to schedule appt per last OV 07/2021

## 2022-06-01 ENCOUNTER — Ambulatory Visit (INDEPENDENT_AMBULATORY_CARE_PROVIDER_SITE_OTHER): Payer: Medicare HMO | Admitting: Nurse Practitioner

## 2022-06-01 ENCOUNTER — Encounter: Payer: Self-pay | Admitting: Nurse Practitioner

## 2022-06-01 VITALS — BP 104/60 | HR 93 | Temp 98.3°F | Resp 14 | Ht 65.0 in | Wt 144.1 lb

## 2022-06-01 DIAGNOSIS — Z87891 Personal history of nicotine dependence: Secondary | ICD-10-CM | POA: Diagnosis not present

## 2022-06-01 DIAGNOSIS — R52 Pain, unspecified: Secondary | ICD-10-CM | POA: Insufficient documentation

## 2022-06-01 DIAGNOSIS — U071 COVID-19: Secondary | ICD-10-CM

## 2022-06-01 DIAGNOSIS — E8801 Alpha-1-antitrypsin deficiency: Secondary | ICD-10-CM

## 2022-06-01 DIAGNOSIS — J9611 Chronic respiratory failure with hypoxia: Secondary | ICD-10-CM

## 2022-06-01 DIAGNOSIS — J449 Chronic obstructive pulmonary disease, unspecified: Secondary | ICD-10-CM | POA: Diagnosis not present

## 2022-06-01 DIAGNOSIS — M5010 Cervical disc disorder with radiculopathy, unspecified cervical region: Secondary | ICD-10-CM | POA: Diagnosis not present

## 2022-06-01 DIAGNOSIS — Z9981 Dependence on supplemental oxygen: Secondary | ICD-10-CM | POA: Diagnosis not present

## 2022-06-01 DIAGNOSIS — J438 Other emphysema: Secondary | ICD-10-CM

## 2022-06-01 LAB — POCT INFLUENZA A/B
Influenza A, POC: NEGATIVE
Influenza B, POC: NEGATIVE

## 2022-06-01 LAB — POC COVID19 BINAXNOW: SARS Coronavirus 2 Ag: POSITIVE — AB

## 2022-06-01 MED ORDER — NIRMATRELVIR/RITONAVIR (PAXLOVID)TABLET: 3.0000 | ORAL_TABLET | Freq: Two times a day (BID) | ORAL | 0 refills | Status: AC

## 2022-06-01 MED ORDER — PREDNISONE 20 MG PO TABS: ORAL_TABLET | ORAL | 0 refills | Status: AC

## 2022-06-01 NOTE — Assessment & Plan Note (Signed)
Currently followed by pulmonology.  On chronic nasal cannula oxygen at 3 to 5 L.

## 2022-06-01 NOTE — Assessment & Plan Note (Signed)
History of cervical spine surgery.  Patient is followed by Dr. Zollie Scale at pain management he manages her gabapentin which is currently 800 mg 3 times daily.  Patient tolerating medication well

## 2022-06-01 NOTE — Progress Notes (Signed)
Established Patient Office Visit  Subjective   Patient ID: Maria [name], female    DOB: [birth year]  Age: 66 y.o. MRN: [id number]  Chief Complaint  Patient presents with   Transfer of Care   Generalized Body Aches    Started 2 days ago, increased SOB, decreased appetite, cough, body aches, tongue feels sore, malaise, headache.    HPI  Body aches: States that sympotms approx 2 days ago. States husband states that she did not sound right. States decreased appetitie and doing ok on fluid.  No sick contacts Has tried OTC nyquill that helped with rest.   Aortic atherosclerosis: Incidental finding on CT chest without contrast.  COPD: states that she can use albuterol 2 times a day plus depending on weather and humidity.  Patient is currently on Breztri maintenance inhaler and albuterol as needed she has followed by [name] from pulmonology.  Patient is on chronic oxygen at 3 to 5 L depending on if she is at rest or exerting herself.  Chronic resp failure: every 3 months with [name]. States that she is on chronic oxygen. At rest 3 and with activity 5 liters. States as of late she has had to bump it to 4 lit  OAB: Scott macdirmad. Manages OAB medication. Has an appointment this month.   Pain management: Dr Driscilla Moats every 3 months. States that it is for her neck and is on gabapentin.     Review of Systems  Constitutional:  Positive for chills and malaise/fatigue. Negative for fever.  HENT:  Positive for sore throat. Negative for ear discharge, ear pain and sinus pain.   Respiratory:  Positive for cough. Negative for sputum production.   Cardiovascular:  Negative for chest pain.  Gastrointestinal:  Negative for abdominal pain, constipation, diarrhea, nausea and vomiting.       BM every other day   Musculoskeletal:  Positive for myalgias.  Neurological:  Positive for dizziness (light headness) and headaches.  Psychiatric/Behavioral:  Negative for hallucinations and  suicidal ideas.       Objective:     BP 104/60   Pulse 93   Temp 98.3 F (36.8 C) (Oral)   Resp 14   Ht '5\' 5"'$  (1.651 m)   Wt 144 lb 2 oz (65.4 kg)   SpO2 99% Comment: on 3 liters of O2  BMI 23.98 kg/m    Physical Exam Vitals and nursing note reviewed.  Constitutional:      Appearance: Normal appearance.  HENT:     Right Ear: Tympanic membrane, ear canal and external ear normal.     Left Ear: Tympanic membrane, ear canal and external ear normal.     Mouth/Throat:     Mouth: Mucous membranes are moist.     Pharynx: Oropharynx is clear.  Eyes:     Extraocular Movements: Extraocular movements intact.     Pupils: Pupils are equal, round, and reactive to light.     Comments: Wears corrective lenses  Cardiovascular:     Rate and Rhythm: Normal rate and regular rhythm.     Heart sounds: Normal heart sounds.  Pulmonary:     Effort: Pulmonary effort is normal.     Breath sounds: No wheezing or rales.     Comments: Decreased globally  On 4 L [place]  Musculoskeletal:     Right lower leg: No edema.     Left lower leg: No edema.  Lymphadenopathy:     Cervical: No cervical adenopathy.  Skin:  General: Skin is warm.  Neurological:     General: No focal deficit present.     Mental Status: She is alert.     Deep Tendon Reflexes:     Reflex Scores:      Bicep reflexes are 2+ on the right side and 2+ on the left side.      Patellar reflexes are 2+ on the right side and 2+ on the left side.    Comments: Bilateral upper and lower extremity strength 5/5      Results for orders placed or performed in visit on 06/01/22  POC COVID-19  Result Value Ref Range   SARS Coronavirus 2 Ag Positive (A) Negative  POCT Influenza A/B  Result Value Ref Range   Influenza A, POC Negative Negative   Influenza B, POC Negative Negative      The 10-year ASCVD risk score (Arnett DK, et al., 2019) is: 3.2%    Assessment & Plan:   Problem List Items Addressed This Visit        Respiratory   Stage 2 moderate COPD by GOLD classification ([place])    Followed by pulmonology, maintenance inhaler, as needed inhaler, as needed nebulizer.  Continue taking medication as prescribed follow-up pulmonology as recommended      Relevant Medications   predniSONE (DELTASONE) 20 MG tablet   Chronic respiratory failure with hypoxia [facility])    Currently followed by pulmonology.  On chronic nasal cannula oxygen at 3 to 5 L.      Emphysema due to alpha-1-antitrypsin deficiency (HCC)    Currently on oxygen, Breztri, albuterol inhaler as needed.  Continue following up with pulmonology as recommended take medication as prescribed.      Relevant Medications   predniSONE (DELTASONE) 20 MG tablet     Nervous and Auditory   Cervical disc disorder with radiculopathy of cervical region (right)    History of cervical spine surgery.  Patient is followed by Dr. Zollie Scale at pain management he manages her gabapentin which is currently 800 mg 3 times daily.  Patient tolerating medication well        Other   COVID-19 virus infection    COVID test positive.  Did discuss antiviral treatments and that they are EUA only.  After joint discussion did elect to place patient on Paxlovid.  Did discuss common side effects of medication inclusive of of altered taste in mouth and nausea.  Did discuss CDC guidelines/recommendations in regards to self-isolation/quarantine.  Did discuss signs and symptoms when to be seen urgent or emergently.  Start Paxlovid ASAP.  Follow-up if no improvement      Relevant Medications   nirmatrelvir/ritonavir EUA (PAXLOVID) 20 x 150 MG & 10 x '100MG'$  TABS   predniSONE (DELTASONE) 20 MG tablet   Smoking history    Former smoker.  Last CT scan of chest without contrast was done in September 2023 reviewed in office.      Requires continuous at home supplemental oxygen (3L)  COPD    Patient currently managed on 3 to 5 L of nasal cannula.  She is followed by pulmonology.      Body  aches - Primary    COVID-19 test positive in office.  Flu test negative.      Relevant Orders   POC COVID-19 (Completed)   POCT Influenza A/B (Completed)    Return in about 3 months (around 09/01/2022) for Recheck on chronic conditions .    Romilda Garret, NP

## 2022-06-01 NOTE — Assessment & Plan Note (Signed)
COVID test positive.  Did discuss antiviral treatments and that they are EUA only.  After joint discussion did elect to place patient on Paxlovid.  Did discuss common side effects of medication inclusive of of altered taste in mouth and nausea.  Did discuss CDC guidelines/recommendations in regards to self-isolation/quarantine.  Did discuss signs and symptoms when to be seen urgent or emergently.  Start Paxlovid ASAP.  Follow-up if no improvement

## 2022-06-01 NOTE — Assessment & Plan Note (Signed)
Patient currently managed on 3 to 5 L of nasal cannula.  She is followed by pulmonology.

## 2022-06-01 NOTE — Assessment & Plan Note (Signed)
Currently on oxygen, Breztri, albuterol inhaler as needed.  Continue following up with pulmonology as recommended take medication as prescribed.

## 2022-06-01 NOTE — Assessment & Plan Note (Signed)
COVID-19 test positive in office.  Flu test negative.

## 2022-06-01 NOTE — Assessment & Plan Note (Signed)
Former smoker.  Last CT scan of chest without contrast was done in September 2023 reviewed in office.

## 2022-06-01 NOTE — Patient Instructions (Addendum)
Nice to see you today If you are not improving by Monday let me know If you get worse over the weekend go be seen. I want to see you in 3 months to make sure you are doing well.  You need to quarantine through 06/04/2022. Then wear a mask when you are out and about through 06/09/2022

## 2022-06-01 NOTE — Assessment & Plan Note (Signed)
Followed by pulmonology, maintenance inhaler, as needed inhaler, as needed nebulizer.  Continue taking medication as prescribed follow-up pulmonology as recommended

## 2022-06-08 DIAGNOSIS — S92355A Nondisplaced fracture of fifth metatarsal bone, left foot, initial encounter for closed fracture: Secondary | ICD-10-CM | POA: Diagnosis not present

## 2022-06-13 ENCOUNTER — Encounter: Payer: Self-pay | Admitting: Urology

## 2022-06-13 ENCOUNTER — Ambulatory Visit: Payer: Medicare HMO | Admitting: Urology

## 2022-06-13 VITALS — BP 146/70 | HR 70 | Ht 65.0 in | Wt 144.0 lb

## 2022-06-13 DIAGNOSIS — N3946 Mixed incontinence: Secondary | ICD-10-CM

## 2022-06-13 MED ORDER — OXYBUTYNIN CHLORIDE ER 10 MG PO TB24: 10.0000 mg | ORAL_TABLET | Freq: Every day | ORAL | 11 refills | Status: DC

## 2022-06-13 NOTE — Progress Notes (Signed)
06/13/2022 10:20 AM   [name] 03/04/1956 [id number]  Referring provider: Waunita Schooner, MD [address] [place],  [place] [address]  Chief Complaint  Patient presents with   Urinary Incontinence   Follow-up    HPI: Was consulted to assess the patient's urinary incontinence.  She has urge incontinence and can use 7 heavy pads a day that are quite wet.  Sometimes she has bedwetting.  She has moderate stress incontinence with coughing sneezing.  She voids every 60 to 90 minutes and not all of 2 hours.  She gets up to 3 times a night   Sometimes she has intermittent burning at her recent urine culture was negative  She has COPD and lung damage and home oxygen from COVID.  She has had a hysterectomy.  She uses a cane     Bladder neck demonstrated mild hypermobility.  No stress incontinence with moderate cough.  Had a high small grade 1 cystocele.  Patient could not lay flat because of her breathing   Patient has mixed incontinence.  She has medical comorbidities due to her lungs and ability.  I think it is reasonable to hold off on urodynamics currently. Patient failed Myrbetriq   Cystoscopy normal.   Patient will be treated with oxybutynin ER 10 mg 3x11.  I may try 1 more medication after this.  No Gemtesa samples today.  Reassess in 6 weeks.  Percutaneous tibial nerve stimulation or in office Botox are considerations.   Today Urgency incontinence persisting.  Yesterday she felt discomfort with foot on the floor syndrome.  I went over percutaneous tibial nerve stimulation and gave a handout.    We gave Gemtesa samples.  I gave an urgent PC handout.  We will get a urine culture make sure she does not have an infection.  She will call in 1 month and start percutaneous tibial nerve stimulation if medication does not help   Today Frequency stable.  Last culture negative Patient still has urge and cannot insulin back on oxybutynin.  She just fractured her foot and it is  healing in a boot.  Still on home oxygen and now in a scooter because the left foot.  Wants to pursue percutaneous tibial nerve stimulation.  Clinically not infected   PMH: Past Medical History:  Diagnosis Date   Atherosclerosis 1/0612014   aorta, iliacs and CFA bilaterally no greater than 0-49% - lower arterial doppler.   Blood transfusion without reported diagnosis 2005   Cancer [facility]) Ovarian 1978   Chest pain    COPD (chronic obstructive pulmonary disease) ([place])    COVID-19 long hauler manifesting chronic dyspnea    Emphysema of lung ([place]) 2019   Oxygen deficiency 03/2019   Oxygen dependent    3 liters up to 5    Surgical History: Past Surgical History:  Procedure Laterality Date   [place]  2010   COLONOSCOPY     COLONOSCOPY     COLONOSCOPY WITH PROPOFOL N/A 11/01/2021   Procedure: COLONOSCOPY WITH PROPOFOL;  Surgeon: Jonathon Bellows, MD;  Location: [facility] ENDOSCOPY;  Service: Gastroenterology;  Laterality: N/A;   ESOPHAGOGASTRODUODENOSCOPY     NECK SURGERY  1999   OVARIAN CYST REMOVAL  1988   SPINE SURGERY  Neck 2002    Home Medications:  Allergies as of 06/13/2022       Reactions   Montelukast    Bad dreams        Medication List  Accurate as of June 13, 2022 10:20 AM. If you have any questions, ask your nurse or doctor.          albuterol 108 (90 Base) MCG/ACT inhaler Commonly known as: VENTOLIN HFA TAKE 2 PUFFS BY MOUTH EVERY 6 HOURS AS NEEDED FOR WHEEZE OR SHORTNESS OF BREATH   Aspirin Low Dose 81 MG tablet Generic drug: aspirin EC TAKE 1 TABLET BY MOUTH EVERY DAY   atorvastatin 10 MG tablet Commonly known as: LIPITOR TAKE 1 TABLET BY MOUTH EVERY DAY   Azelastine HCl 137 MCG/SPRAY Soln USE 1 SPRAY INTO EACH NOSTRIL TWICE A DAY   benzonatate 200 MG capsule Commonly known as: TESSALON Take 1 capsule (200 mg total) by mouth 3 (three) times daily as needed.   Breztri Aerosphere 160-9-4.8 MCG/ACT  Aero Generic drug: Budeson-Glycopyrrol-Formoterol Inhale 2 puffs into the lungs in the morning and at bedtime.   escitalopram 20 MG tablet Commonly known as: Lexapro Take 1 tablet (20 mg total) by mouth daily.   Flutter Devi Use as directed   gabapentin 800 MG tablet Commonly known as: Neurontin Take 1 tablet (800 mg total) by mouth 3 (three) times daily.   ipratropium-albuterol 0.5-2.5 (3) MG/3ML Soln Commonly known as: DUONEB USE 1 AMPULE IN NEBULIZER 4 TIMES A DAY AS NEEDED (DX: J84.112, J44.9)   oxybutynin 10 MG 24 hr tablet Commonly known as: DITROPAN-XL Take 1 tablet (10 mg total) by mouth daily.   OXYGEN Inhale into the lungs. 3 liters at rest and 5 liters with any activity.-   pantoprazole 40 MG tablet Commonly known as: Protonix Take 1 tablet (40 mg total) by mouth daily.   roflumilast 500 MCG Tabs tablet Commonly known as: DALIRESP Take 1 tablet (500 mcg total) by mouth daily.        Allergies:  Allergies  Allergen Reactions   Montelukast     Bad dreams    Family History: Family History  Problem Relation Age of Onset   Heart failure Mother    Diabetes Mother    Kidney disease Mother    Hypertension Mother    Arthritis Mother    Heart disease Mother    Obesity Mother    COPD Father    Hearing loss Father    Lung cancer Brother    COPD Brother    COPD Brother    Asthma Son    Diabetes Son    Birth defects Son    Breast cancer Maternal Aunt 13    Social History:  reports that she quit smoking about 6 years ago. Her smoking use included cigarettes. She has a 45.00 pack-year smoking history. She has never used smokeless tobacco. She reports that she does not drink alcohol and does not use drugs.  ROS:                                        Physical Exam: There were no vitals taken for this visit.  Constitutional:  Alert and oriented, No acute distress. HEENT: [place] AT, moist mucus membranes.  Trachea midline, no  masses.   Laboratory Data: Lab Results  Component Value Date   WBC 9.5 03/31/2022   HGB 12.9 03/31/2022   HCT 38.8 03/31/2022   MCV 95.4 03/31/2022   PLT 334.0 03/31/2022    Lab Results  Component Value Date   CREATININE 0.69 03/31/2022    No results found for: "  PSA"  No results found for: "TESTOSTERONE"  Lab Results  Component Value Date   HGBA1C 6.0 10/15/2021    Urinalysis    Component Value Date/Time   APPEARANCEUR Clear 05/31/2021 0910   GLUCOSEU Negative 05/31/2021 0910   BILIRUBINUR Negative 05/31/2021 0910   PROTEINUR Negative 05/31/2021 0910   UROBILINOGEN 0.2 03/15/2021 1147   NITRITE Negative 05/31/2021 0910   LEUKOCYTESUR Negative 05/31/2021 0910    Pertinent Imaging:   Assessment & Plan: Renew oxybutynin.  Start percutaneous tibial nerve stimulation in approximately 3 months  There are no diagnoses linked to this encounter.  No follow-ups on file.  Reece Packer, MD  [facility] [address], [place], [place] [address] [phone]

## 2022-06-20 DIAGNOSIS — S92355A Nondisplaced fracture of fifth metatarsal bone, left foot, initial encounter for closed fracture: Secondary | ICD-10-CM | POA: Diagnosis not present

## 2022-06-23 DIAGNOSIS — J449 Chronic obstructive pulmonary disease, unspecified: Secondary | ICD-10-CM | POA: Diagnosis not present

## 2022-06-23 DIAGNOSIS — J9611 Chronic respiratory failure with hypoxia: Secondary | ICD-10-CM | POA: Diagnosis not present

## 2022-07-01 ENCOUNTER — Telehealth: Payer: Self-pay | Admitting: Nurse Practitioner

## 2022-07-01 NOTE — Telephone Encounter (Signed)
Patient called in and was wanting to know if Maria [name] could call something in for her. She stated she is experiencing a cough and some stuffiness. Informed patient she will need to be seen, but she would rather have something called in. Please advise. Thank you!

## 2022-07-01 NOTE — Telephone Encounter (Signed)
Spoke to patient by telephone and was advised that she was fine until yesterday and all of a sudden she started with a cough and stuffiness. Patient stated that she has not done a covid test, but will do one now. Advised patient that I will call her back in about 30 minutes.

## 2022-07-01 NOTE — Telephone Encounter (Signed)
Patient notified as instructed by telephone and verbalized understanding. Patient stated that she will get the OTC medication that is recommended. Patient stated that she does have the pills for cough. Patient stated that she does not feel that she needs to go an an UC at this time, but will go if she gets worse. Patient was given ER precautions and she verbalized understanding.

## 2022-07-01 NOTE — Telephone Encounter (Signed)
Spoke to patient by telephone and was advised that she did a covid test and it's negative. Patient stated yesterday she has chills and a low grade fever but that is gone today. Patient stated now she has a productive cough-clear and has head congestion. Patient denies SOB or difficulty breathing. Patient wants to know what Romilda Garret NP would recommend that she take over the counter for her symptoms. Patient was given ER precautions and she verbalized understanding.

## 2022-07-01 NOTE — Telephone Encounter (Signed)
She truly needs to be evaluated with her history of resp failure and recent dx of covid. Looks like she gets tessalon pearls from pulmonary. She can take over the counter plan mucinex and delsym for the cough. She needs to be counseled if she gets worse she needs to be seen

## 2022-07-05 ENCOUNTER — Encounter: Payer: Self-pay | Admitting: Internal Medicine

## 2022-07-05 ENCOUNTER — Ambulatory Visit: Payer: Medicare HMO | Admitting: Internal Medicine

## 2022-07-05 VITALS — BP 118/64 | HR 97 | Ht 65.0 in | Wt 145.0 lb

## 2022-07-05 DIAGNOSIS — Z87898 Personal history of other specified conditions: Secondary | ICD-10-CM | POA: Diagnosis not present

## 2022-07-05 DIAGNOSIS — Z148 Genetic carrier of other disease: Secondary | ICD-10-CM

## 2022-07-05 DIAGNOSIS — J9611 Chronic respiratory failure with hypoxia: Secondary | ICD-10-CM

## 2022-07-05 DIAGNOSIS — J449 Chronic obstructive pulmonary disease, unspecified: Secondary | ICD-10-CM

## 2022-07-05 DIAGNOSIS — J441 Chronic obstructive pulmonary disease with (acute) exacerbation: Secondary | ICD-10-CM | POA: Diagnosis not present

## 2022-07-05 DIAGNOSIS — Z8616 Personal history of COVID-19: Secondary | ICD-10-CM | POA: Diagnosis not present

## 2022-07-05 DIAGNOSIS — Z789 Other specified health status: Secondary | ICD-10-CM

## 2022-07-05 LAB — CBC WITH DIFFERENTIAL/PLATELET
Basophils Absolute: 0.1 10*3/uL (ref 0.0–0.1)
Basophils Relative: 1 % (ref 0.0–3.0)
Eosinophils Absolute: 0.1 10*3/uL (ref 0.0–0.7)
Eosinophils Relative: 1.2 % (ref 0.0–5.0)
HCT: 33.5 % — ABNORMAL LOW (ref 36.0–46.0)
Hemoglobin: 11.2 g/dL — ABNORMAL LOW (ref 12.0–15.0)
Lymphocytes Relative: 26.6 % (ref 12.0–46.0)
Lymphs Abs: 3.1 10*3/uL (ref 0.7–4.0)
MCHC: 33.5 g/dL (ref 30.0–36.0)
MCV: 93.4 fl (ref 78.0–100.0)
Monocytes Absolute: 0.7 10*3/uL (ref 0.1–1.0)
Monocytes Relative: 6.2 % (ref 3.0–12.0)
Neutro Abs: 7.6 10*3/uL (ref 1.4–7.7)
Neutrophils Relative %: 65 % (ref 43.0–77.0)
Platelets: 386 10*3/uL (ref 150.0–400.0)
RBC: 3.59 Mil/uL — ABNORMAL LOW (ref 3.87–5.11)
RDW: 13.7 % (ref 11.5–15.5)
WBC: 11.7 10*3/uL — ABNORMAL HIGH (ref 4.0–10.5)

## 2022-07-05 NOTE — Progress Notes (Signed)
Subjective:  Patient ID: Maria [name], [name] , DOB: December 17, 1955 , age 66 y.o. , MRN: [id number] , ADDRESS: [place] [id number]   03/19/2019 -   Chief Complaint  Patient presents with   Hospitalization Follow-up    Post covid symptoms   Post COVID  follow-up in the pulmonary office  HPI Maria [name] 66 y.o. 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  And also poor intake.  Was diagnosed to have acute COVID-19.  She remembers being on oxygen.  At the time prior to admission she is had chronic hip pain based on her history and review of the records for which she was using a cane.  She spent a total of 5 days at [facility] [place] [facility] and was discharged on March 03, 2019.  She was treated with oxygen, Solu-Medrol and REM does Advair.  Last set of laboratories reviewed on March 03, 2019 which showed that she had normal hemoglobin and creatinine and his CRP had improved.  The only chest x-ray that was available is 1 on admission in February 26, 2019 but she has some basal infiltrates.  She now presents to the pulmonary clinic on March 19, 2019.  It is 21 days since admission and she is over 21 days since her first set of symptoms.  She tells me that she is a lot more fatigue.  She is so fatigued that she is not even able to take her inhaler properly which is Trelegy for her COPD.  She feels short of breath all the time.  She is requiring a lot of help taking a shower.  Her hip pain continues.  She is now needing the use of a walker.  Her ECOG is 4.  Her appetite is good.  She is currently not using oxygen either at night or in the daytime.   Results for Maria, [name]" (MRN [id number]) as of  08/22/2019 12:57  Ref. Range 03/01/2019 03:22 03/02/2019 01:55 03/03/2019 01:54  D-Dimer, America Brown Latest Ref Range: 0.00 - 0.50 ug/mL-FEU 1.34 (H) 1.12 (H) 1.28 (H)       04/03/2019 Presents today for a 2-week follow-up.  Echocardiogram showed normal systolic function with estimated EF 60 to 65%.  Left ventricle diastolic doppler consistent with impaired relaxation-determinant filling pressures.  Right ventricle with normal systolic function, increased wall thickness-systolic pressure could not be assessed.  HRCT is scheduled for 9/29.  Patient called on 9/4 asking about portable oxygen tank order.  Reports that her O2 level has read 84 to 85% after exertion with a heart rate of 110.  She reports that her oxygen level recovers greater than 90% after resting.  Is not currently on oxygen at home will need to be walked in office to qualify for POC. Repeat COVID testing was positive, patient reports that she still has lost her taste and smell with increased shortness of breath.   She is feeling a little better today. Family is checking on her. No other help at home. Reports feeling fine when sitting but her O2 drops as low as 85% on exertion. She is able to recover with rest. Taking prednisone taper as prescribed, feels it has helped her chest tightness. She has dry/hacking cough  with no mucus production. She is eating and drinking ok. Denies fever.   04/09/2019 Patient presents today for 1 week follow-up. Unfortunately we were unable to get her set up with home health d/t her insurance. She is here today for qualifying O2 walk. She continues to have some shortness of breath and dry cough. Low energy. Her taste and smell as returned some. She is drinking plenty of fluids. Reports that her cough is not quite as bad, states the more she talks she has to cough. Has trouble sleeping at night d/t her respiratory symptoms. Feels delsym and tessalon perles have been helpful. ONO on RA showed O2 low 70% with baseline 87%.  Ambulatory O2 today showed O2 desaturation 84% on RA, requiring 3L. Using [employer] for nebulizer supplies. Denies fever, weight loss, hemoptysis, purulent mucus.   04/22/2019 Patient presents today for 2 week follow-up. Breathing is the same. Continues to have a dry cough. Completed prednisone 2 days ago. Using incentive spirometry. She has tried to go without her oxygen at home but states that she gets too short winded. 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 Diffuse bronchial wall thickening with moderate centrilobular and paraseptal emphysema; imaging findings suggestive of underlying COPD. She had COPD symptoms before getting COVID. Her breathing was getting a little worse before her diagnosis.  Sleeping better at night with oxygen. Ambulating with rolling walker, still deconditioned/weak. Due for influenza vaccine today.   Oxygen readings:  Resting O2 93% on RA  Ambulatory O2 98-100 on 3L Ambulatory O2 85% on RA  OV 07/03/2019  Subjective:  Patient ID: Maria [name], [name] , DOB: 08-Sep-1955 , age 66 y.o. , MRN: [id number] , ADDRESS: [place] [id number]   07/03/2019 -   Chief Complaint  Patient presents with   Follow-up    PFT performed 12/7 and pt is here following up after that. Pt states she is still becoming SOB with exertion.  Follow-up emphysema Follow-up interstitial lung disease post COVID-19 in August 2020 (had elevatd d-dimer) Follow-up chronic hypoxemic respiratory failure secondary to both   HPI Maria [name] 66 y.o. -presents for the above issues.  She says her condition is returned to normal.  She is back working as a Forensic psychologist.  However she continues to be still significantly symptomatic.  She had a pulmonary function test June 24, 2019.  I visualized the result.  Shows isolated reduction in diffusion capacity  was only moderate at 57%.  FVC and FEV1 are normal.  She continues to be significantly symptomatic as documented below.  She is taking Trelegy.  She says she desaturates to 85% with exertion echocardiogram recently was normal.  She is asking if she should take the Moderna COVID-19 vaccine available in January 2021         IMPRESSION: CT chest 04/16/2019 1. 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 Given the presence of some bronchiectasis, this is technically characterized as probable UIP per current ATS guidelines, however, that is not favored. If there is persistent clinical concern for interstitial lung disease, repeat high-resolution chest CT would be recommended in 12 months to assess for temporal changes in the appearance of the lung parenchyma. 2. Diffuse bronchial wall thickening with moderate centrilobular and paraseptal emphysema;  imaging findings suggestive of underlying COPD. 3. Aortic atherosclerosis, in addition to right coronary artery disease. Please note that although the presence of coronary artery calcium documents the presence of coronary artery disease, the severity of this disease and any potential stenosis cannot be assessed on this non-gated CT examination. Assessment for potential risk factor modification, dietary therapy or pharmacologic therapy may be warranted, if clinically indicated.   Aortic Atherosclerosis (ICD10-I70.0) and Emphysema (ICD10-J43.9).     Electronically Signed   By: Vinnie Langton M.D.   On: 04/16/2019 13:12   OV 08/22/2019  Subjective:  Patient ID: Maria [name], [name] , DOB: 01-Aug-1955 , age 74 y.o. , MRN: [id number] , ADDRESS: [place] [id number]   08/22/2019 -   Chief Complaint  Patient presents with   Follow-up    History of 2019 Novel coronavirus disease (COVID-19)    Follow-up emphysema - alpha 1 MZ in dec 2020 Follow-up interstitial lung disease post COVID-19 in August 2020 Follow-up chronic hypoxemic respiratory failure secondary to both Ex smoker Echo sept 202 0 - ef 65% and diast dysfn  HPI Maria [name] 66 y.o. -presents for follow-up of the above issues.  In the interim she did have serologic work-up for her ILD that was discovered post Covid setting.  Her ANA and rheumatoid factor were trace positive.  Her ESR was 41.  Therefore we committed her to few to several week prednisone taper.  She says she is on a 5-week taper.  She is got 2 more weeks of this.  She says since her last visit she is feeling worse.  The worsening is in terms of shortness of breath.  Also has dizziness and floaters when she exerts.  In fact on a virtual rehab class she had to get on more oxygen.  Her dyspnea symptom scores are worse than before but cough and fatigue around the same.  She is requiring 3 L now with exertion.  Although in rehab the asked her to increase it to 6 L which helped but this was not documented because of worsening hypoxemia.  Today when we walked her she had to stop several times because of dizziness and fatigue although she did not desaturate.  Her lung function itself in December 2020 shows only isolated reduction in DLCO.   Of note she is alpha-1 MZ from her labs at last visit.    OV 04/29/2020   Subjective:  Patient ID: Maria [name], [name] , DOB: 1955-12-01, age 34 y.o. years. , MRN: [id number],  ADDRESS: [place] [address] PCP  [name], MD Providers : Treatment Team:  Attending Provider: Brand Males, MD Patient Care Team: [name], MD as PCP - General (Family Medicine)   Follow-up emphysema - alpha 1 MZ in dec 2020 Follow-up interstitial lung disease post COVID-19 in August 2020 -> feb 2021 Case conf ILD: NO ILD Follow-up chronic hypoxemic respiratory failure secondary to both Ex smoker Echo sept  202 0 - ef 65% and diast dysfn Last CT scan of the chest February 2021  Chief Complaint  Patient presents with   Follow-up    Pt states that she has had good days and bad days since last visit. Pt said that she is still having problems with SOB also has an occ cough.       HPI Maria [name] 66 y.o. -returns for follow-up.  Last seen by myself in February 2021.  After that she is seen  nurse practitioner.  Alpha-1 replacement was advised by nurse practitioner.  Initially denied by insurance but now apparently approved by insurance.  The still waiting for some of paperwork to be clear.  Currently on Trelegy.  She is on oxygen 3 L at rest 5 L with exertion.  She is fully vaccinated.  She is in need of Covid booster.  Overall doing well.  No flowsheet data found.        Simple office walk 185 feet x  3 laps goal with forehead probe 08/22/2019   O2 used 3L  Number laps completed Did only 2 of 3. Stopped 8 times and site down x 2. Got dizzy and floaters in eyes  Comments about pace Very slow pace  Resting Pulse Ox/HR 99% and 91/min  Final Pulse Ox/HR 99% during entire walk  Desaturated </= 88% x  Desaturated <= 3% points x  Got Tachycardic >/= 90/min yes  Symptoms at end of test As above  Miscellaneous comments Sub maximal exertion     Las\bs - alph 1 MZ in dec 2020 - autoimmune  - ANA 1:40, RF 23, and ESR 40s   PFT dec 2020  - isolated reduction in dlco 57%   OV 08/27/2020  Subjective:  Patient ID: Maria [name], [name] , DOB: July 10, 1956 , age 66 y.o. , MRN: [id number] , ADDRESS: [place] [id number] PCP [name], MD Patient Care Team: [name], MD as PCP - General (Family Medicine)  This Provider for this visit: Treatment Team:  Attending Provider: Brand Males, MD    08/27/2020 -   Chief Complaint  Patient presents with   Follow-up    Pt states she still has problems with SOB. States when the weather is colder her breathing  is worse. Pt also has complaints of cough which happens all throughout the day.    Follow-up emphysema - alpha 1 MZ in dec 2020 Follow-up interstitial lung disease post COVID-19 in August 2020 -> feb 2021 Case conf ILD: NO ILD Follow-up chronic hypoxemic respiratory failure secondary to both Ex smoker 40 pack quit Echo sept 202 0 - ef 65% and diast dysfn Last CT scan of the chest February 2021  HPI Maria [name] 66 y.o. -returns to see me for the above issues.  Since last visit she has had a Covid vaccine booster.  Overall she is stable but for the last few months has had worsening cough.  The cough is dry.  She continues with oxygen 3-5 L.  We will try to get her alpha-1 replacement but she got denied by insurance.  She showed me the insurance denial letters on August 10, 2020.  Apparently there was a hearing which she was asked to participate but they sent the note is late and on the same day they denied her appeal.  She feels this is all premeditated by the insurance company.  She is frustrated.  She continues with Trelegy and oxygen.  Review of the records indicate last CT scan of the chest was in February 2021.  She quit smoking less than 15 years ago.  She she is in need of annual CT scan now.  Other than the dry worsening cough she feels well.  There is no worsening wheezing or sputum.   CT Chest data  No results found.       OV 11/13/2020  Subjective:  Patient ID: Maria [name], [name] , DOB: 1956-05-07 , age 42 y.o. , MRN:  [id number] , ADDRESS: [place] [id number] PCP [name], MD Patient Care Team: [name], MD as PCP - General (Family Medicine)  This Provider for this visit: Treatment Team:  Attending Provider: Brand Males, MD    11/13/2020 -   Chief Complaint  Patient presents with   Follow-up    PFT performed 4/28.  Pt states her cough is worse since last visit and states she still becomes SOB easily.   Follow-up emphysema -  alpha 1 MZ in dec 2020  -- On Trelegy for few years as of 2022.  Dose increased early 2022  -Insurance denied alpha-1 replacement in 2020 07/2020 Follow-up interstitial lung disease post COVID-19 in August 2020 -> feb 2021 Case conf ILD: NO ILD Follow-up chronic hypoxemic respiratory failure secondary to both Ex smoker 40 pack quit Echo sept 202 0 - ef 65% and diast dysfn Last CT scan of the chest February 2021  Chronic coough since jan 2022  HPI Maria [name] 66 y.o. -returns for work-up.  I last saw her in February 2022.  At that time she was reporting worsening cough.  We decided to full work-up.  She had pulmonary function test that shows stability since 2020 although the DLCO is down [?  Technical issues, last hemoglobin 12.1 g% in 2021 May] her oxygen use is the same around 3 L at rest 5 L with exertion.  She uses Trelegy.  She is not on any antihypertensives.  Not on any acid reflux medication.  Not on fish oil.  Her smoking is in remission.  The cough wakes her up at night.  Cough severity is documented below.  It significantly high suggesting irritable larynx syndrome/cough neuropathy.  She had CT scan of the chest that only shows emphysema.  No fibrosis or pneumonia or nodules or cancer.  She had an arterial blood gas and there is no hypercapnia.  She is frustrated by her quality of life with excessive cough.  The cough is driving most of the high COPD CAT score of 35.    Dr Lorenza Cambridge Reflux Symptom Index (> 13-15 suggestive of LPR cough) 0 -> 5  =  none ->severe problem 11/13/2020   Hoarseness of problem with voice 1  Clearing  Of Throat 1  Excess throat mucus or feeling of post nasal drip 2  Difficulty swallowing food, liquid or tablets 0  Cough after eating or lying down 4.5  Breathing difficulties or choking episodes 3.5  Troublesome or annoying cough 4  Sensation of something sticking in throat or lump in throat 3  Heartburn, chest pain, indigestion, or stomach acid coming up  0  TOTAL 19    CAT Score 11/13/2020  Total CAT Score 35         ABG Feb 2022  Results for JAMEISHA, STOFKO "CINDY" (MRN [id number]) as of 11/13/2020 09:07  Ref. Range 08/31/2020 13:35  Sample type Unknown ARTERIAL DRAW  FIO2 Unknown 32.00  pH, Arterial Latest Ref Range: 7.350 - 7.450  7.417  pCO2 arterial Latest Ref Range: 32.0 - 48.0 mmHg 37.3  pO2, Arterial Latest Ref Range: 83.0 - 108.0 mmHg 87.3   CT Chest data feb 2022   IMPRESSION: 1.  No acute process in the chest. 2. Moderate centrilobular and paraseptal emphysema. No other explanation for patient's symptoms. Emphysema (ICD10-J43.9). 3.  Aortic Atherosclerosis (ICD10-I70.0).     Electronically Signed   By: Abigail Miyamoto M.D.   On: 09/04/2020 09:01  12/11/2020 Follow up : COPD with emphysema, oxygen dependent respiratory failure, chronic cough Patient returns for a 1 month follow-up.  Patient has underlying moderate COPD with emphysema.  She also has alpha-1 phenotype MZ with a normal level. She is maintained on oxygen 3 L at rest and 5 L with activity. Patient has been dealing with an ongoing chronic cough since COVID-19 infection in August  2020.  Cough is persistent and feels getting progressively worse. Cough is very frustrating with cough associated urinary incontinence .  High resolution CT chest in 2021 and repeat CT chest February 2022 showed no evidence of interstitial lung disease.  Moderate emphysema. Patient has been recommended on cough control regimen.  Last visit she was started on gabapentin 300 mg twice daily.  She was also changed from Trelegy to [facility] in case the dry powder inhaler was aggravating her upper airway Patient says her cough is no better , unchanged .  Using Halls cough drops.  Has been tried on steroid tapers in the past without any improvement.  Is not using any cough syrups.  Denies any fever, discolored mucus, hemoptysis, chest pain cough is all through the day and night . Sometimes  worse at night     OV 01/19/2021  Subjective:  Patient ID: Maria [name], [name] , DOB: [birth year] , age 10 y.o. , MRN: [id number] , ADDRESS: [address] [place] [facility] [id number] PCP [name], MD Patient Care Team: [name], MD as PCP - General (Family Medicine)  This Provider for this visit: Treatment Team:  Attending Provider: Brand Males, MD  Follow-up emphysema - alpha 1 MZ in dec 2020  -- On Trelegy for few years as of 2022.  Dose increased early 2022  -Insurance denied alpha-1 replacement in 2020 07/2020 Follow-up interstitial lung disease post COVID-19 in August 2020 -> feb 2021 Case conf ILD: NO ILD Follow-up chronic hypoxemic respiratory failure secondary to both Ex smoker 40 pack quit Echo sept 202 0 - ef 65% and diast dysfn Last CT scan of the chest February 2022  Chronic coough since jan 2022  01/19/2021 -   Chief Complaint  Patient presents with   Follow-up    Pt states she still has complaints of coughing which is about the same since last visit. States she is coughing up clear thick phlegm. States SOB is worse when she goes outside in the humidity but is fine if she stays inside.     HPI Maria [name] 66 y.o. -returns for follow-up of her COPD and severe chronic cough.  She continues to use 3 L of oxygen.  She says without oxygen at rest she starts getting dizzy.  However after return the oxygen off her pulse ox was 92% on room air at rest 10 minutes later.  Overall she feels she is stable but she continues to have significant amount of cough that she rates as moderate.  Wakes her up at night.  There is no associated wheezing.  She does clear her throat and has a laryngeal quality to the cough.  Last visit nurse practitioner added Chlor-Trimeton and Tessalon and Robitussin but despite this cough persist.  She continues on gabapentin 3 mg twice daily.  She has never been to voice rehabilitation.  She is not sure if prednisone has helped her cough  but she knows prednisone makes her irritable but she is willing to give it a short-term empiric trial    CAT Score 12/11/2020 11/13/2020  Total CAT  Score 18 35     IMPRESSION: 1.  No acute process in the chest. 2. Moderate centrilobular and paraseptal emphysema. No other explanation for patient's symptoms. Emphysema (ICD10-J43.9). 3.  Aortic Atherosclerosis (ICD10-I70.0).     Electronically Signed   By: Abigail Miyamoto M.D.   On: 09/04/2020 09:18 Jul 2021   07/28/2021 Follow up : COPD with emphysema, oxygen dependent respiratory failure, chronic cough Patient returns for 60-monthfollow-up.  Patient remains on Breztri inhaler twice daily.  She continues to have ongoing chronic cough despite aggressive cough control regimen.  She is on gabapentin 300 mg 3 times daily, Robitussin DM every 4 hours, Tessalon Perles and Chlor-Trimeton.  Along with reflux diet and PPI. Ran out of Duoneb 2 weeks ago. Typically uses Twice daily . Notices difference in breathing without it.  She remains on oxygen 3 L at rest and 5 L with activity.Uses POC when out from home. Really helps.  Overall feels she is doing some better. Still has cough but not quite is bad. Does have some tightness in chest and back on right side at times. No chest pain, palpitations,  Flu shot is utd. PVX is utd.   Lives at home with husband. Drives. Brother has COPD on O2 . Husband works fulltime Adult kOptician, dispensing 6 grandkids.  Stays active, light housework, shopping.   Patient's insurance will not cover Protonix 20 mg twice daily.  Will only cover Protonix 40 mg.  We discussed changing her prescription over She denies any increased reflux symptoms  OV 10/20/2021  Subjective:  Patient ID: Maria [name] , DOB: 8November 08, 1957, age 66y.o. , MRN: [id number], [place] [id number] CLesleigh Noe MD Patient Care Team: CLesleigh Noe MD as PCP - General (Family Medicine)  This Provider for this  visit: Treatment Team:  Attending Provider: RBrand Males MD    10/20/2021 -   Chief Complaint  Patient presents with   Follow-up    2 mo f/u for COPD. Wants to switch from [employer] to [place] her O[place]. 3L at rest, 5L w/exertion.    Follow-up emphysema - alpha 1 MZ in dec 2020  -- On Trelegy for few years as of 2022.  Dose increased early 2022  -Insurance denied alpha-1 replacement in 2020 07/2020 Follow-up interstitial lung disease post COVID-19 in August 2020 -> feb 2021 Case conf ILD: NO ILD Follow-up chronic hypoxemic respiratory failure secondary to both Ex smoker 40 pack quit Echo sept 202 0 - ef 65% and diast dysfn Last CT scan of the chest February 2022  Chronic coough since jan 2022 -on gabapentin   HPI CMIRABEL [id number].o. -returns for follow-up.  Her COPD CAT score is actually better than previous 2022 but similar to May 2022.  Nevertheless she is on 3 L oxygen at rest and 5 L with exertion.  She states in the last 6 months has been on prednisone "quite a few times" for COPD exacerbation and antibiotics at least 2 times.  But currently she is feeling stable.  She does have chronic cough for which she is on a bunch of medications.  She wants to qualify for oxygen.  She is having insurance coverage issues.  She is waiting till she turns 89in August 2023 before she can enroll in Medicare.  She is interested in joining the [facility] insurance program with Medicare.  Her last CT scan of the chest was a  year ago.  We discussed the prevention of COPD exacerbations.  We discussed Roflumilast.  She is interested in this.  We also discussed the potential insurance approval with Dupixent.  She is willing to get a CBC with differential checked.  We discussed upcoming potential clinical trials such as the one being sponsored by [place] which might be available in the future.  She is interested in that if she qualifies.  At this point in time she is okay starting  Daliresp.  She was sitting on 3 L oxygen.  I turned this off to get a qualifying walk test done.       07/28/2021   10:24 AM 12/11/2020    9:24 AM 11/13/2020    8:49 AM  CAT Score  Total CAT Score 19 18 35     12/15/2021 Follow up : COPD with emphysema, oxygen dependent respiratory failure Patient presents for a 81-monthfollow-up and preop surgical risk assessment.  Patient has underlying moderate COPD with emphysema.  She is oxygen dependent she says overall she has been doing well with her breathing.  She still gets short of breath with minimal activities.  Has had no flare of her breathing recently.  No recent antibiotics or steroid use.  Patient was seen early last month.  She was set up for an ABG that showed no significant hypercarbia.  She has been set up for CT chest that is pending, routine as she has a heavy smoking history.  Also Daliresp was added into her maintenance regimen.  She remains on Breztri twice daily. Spirometry today in the office shows stable lung function with FEV1 at 66%, ratio 87, FVC 59%.  [place] [facility] [place], Dr. MSabra Heck. Has degenerative cervical spinal stenosis. Has chronic neck pain.  Has hard time dressing due to neck and left arm pain. Becoming hard to use left arm due to weakness.   Lives at home with spouse. Able to drive. Can do very light house chores and cooking . Can only walk short distances and has to rest. Gets winded easily .   Remains on Oxygen 3l/m at rest and 5l/m with activity .  She denies any increased oxygen demands.    Was seen 02/15/22 by PCP with aecopd rx zpak and pred > no better  Dyspnea:  room to room but comfortable at rest / no cp  Cough: yellowish worse than usual x 02/12/22 very congested sounding / assoc nasal congestion  SABA use: nebulizer not helping   baseline using 3 x daily  /  5-6 x daily since onset of flare 02 turned up to 4lpm POC sats low 90s   Rx  prednisone  OV 03/31/2022  Subjective:  Patient ID: Maria [name] , DOB: 805/19/1957, age 66y.o. , MRN: [id number], [address] [id number] CLesleigh Noe MD Patient Care Team: CLesleigh Noe MD as PCP - General (Family Medicine)  This Provider for this visit: Treatment Team:  Attending Provider: RBrand Males MD  03/31/2022 -   Chief Complaint  Patient presents with   Follow-up    Pt states that she is still having problems with coughing. States sometimes when she coughs it is clear but will also have a yellow tint to it. Also has some wheezing associated.     HPI Maria ADEE89y.o. -[Marlane Mingleentry] she was seen early August for COPD exacerbation.  She is alpha-1  MZ.  She has quit smoking but still she is not feeling better.  She is having coughing occasional sputum and wheezing.  Symptoms seem worse than baseline and consistent with a COPD flareup.  She is frustrated with repeated flareups.  In 2020 when she was able to walk 3 L and this was adequate.  Currently using 3 L at rest and 5 L with exertion although it is unclear if it is based on subjective needs.  At follow-up we will check walking desaturation test on 3 and 4 L.  Her blood eosinophils have been normal in the past.    CT Chest data sept 022  IMPRESSION: 1. No acute cardiopulmonary process. 2. Aortic Atherosclerosis (ICD10-I70.0) and Emphysema (ICD10-J43.9).     Electronically Signed   By: Ronney Asters M.D.   On: 03/27/2022 21:05  No results found.   Latest Reference Range & Units 10/02/08 22:05 11/09/16 22:06 02/27/19 03:45 02/28/19 01:15 03/01/19 03:22 03/02/19 01:55 03/03/19 01:54 03/19/19 09:56 11/18/19 13:26 10/20/21 09:53  Eosinophils Absolute 0.0 - 0.7 K/uL 0.1 0.2 0.0 0.0 0.0 0.0 0.0 0.1 0.2 0.1    OV 07/05/2022  Subjective:  Patient ID: Maria [name], [name] , DOB: Apr 13, 1956 , age 82 y.o. , MRN: [id number] , ADDRESS: [address] [facility] [place]  [id number] PCP Michela Pitcher, NP Patient Care Team: Michela Pitcher, NP as PCP - General (Nurse Practitioner)  This Provider for this visit: Treatment Team:  Attending Provider: Brand Males, MD   Follow-up emphysema - alpha 1 MZ in dec 2020  -- On Trelegy for few years as of 2022.  Dose increased early 2022  -Insurance denied alpha-1 replacement in 2020 07/2020 Follow-up concern interstitial lung disease post COVID-19 in August 2020 -> feb 2021 Case conf ILD: NO ILD Follow-up chronic hypoxemic respiratory failure secondary to cp[d  -ABG without hypercapnia April 2023 Ex smoker 40 pack quit Echo sept 202 0 - ef 65% and diast dysfn Last CT scan of the chest February 2022 -> sep 2023   - emphysema +. No cancer  Chronic coough since jan 2022 -on gabapentin   07/05/2022 -   Chief Complaint  Patient presents with   Follow-up    Pt states she has been having some heaviness in her chest. States she is about the same as last visit.     HPI Maria [name] 66 y.o. -returns for follow-up.  After last visit she wanted to participate in [place] biological injection research protocol to prevent COPD exacerbations.  However she screen failed because of prolonged QTc which we attributed to her recent antidepressant.  We updated her primary care physician about it.  She continues on the same dose of antidepressant.  No arrhythmias and no hospitalizations due to cardiac issues since.  She went on a cruise with her family and return mid November 2023.  Then 1 or 2 days before Thanksgiving 2023 she developed COVID-19.  She believes she was treated with Paxlovid and prednisone.  However the cough still lingers and she feels she is has an ongoing exacerbation.  She is open to the idea of retaking prednisone.  She is frustrated she did not qualify for research protocol.  While she was ill the Wednesday before Thanksgiving she did trip and then fractured her left lower extremity foot.  She has a boot  on.  At this point in time for her pregnancy with exacerbation she is continuing on Breztri and also Roflumilast.  She is never taken  and acetylcysteine [1 study from [place] showing benefit].  We tested her eosinophils in the past and they were normal so she did not qualify for Dupixent.   Of note: She recently got her living will and advanced care planning directives done.  She has assigned her daughter who I need a is the main healthcare power of attorney.  She also prefers to have a natural death and she has signed advanced directive papers for this.  Regarding her prolonged QTc: In October 2023 QTc was 475 ms.  We did an EKG today and I personally visualized the trace.  Her QT is 4 and 8 ms.  Her QTc H is 429 ms..  This is equivalent to the QTc [place] of 434 ms for a heart rate of 72 bpm.  Much improved and normal PFT     Latest Ref Rng & Units 11/12/2020   10:43 AM 01/16/2020   10:57 AM 06/24/2019    8:45 AM  PFT Results  FVC-Pre L 2.94  3.17  2.93   FVC-Predicted Pre % 84  90  83   FVC-Post L 3.18  3.25  3.39   FVC-Predicted Post % 91  92  96   Pre FEV1/FVC % % 54  55  56   Post FEV1/FCV % % 54  56  59   FEV1-Pre L 1.60  1.75  1.64   FEV1-Predicted Pre % 59  65  60   FEV1-Post L 1.71  1.83  1.99   DLCO uncorrected ml/min/mmHg 8.06  10.81  12.24   DLCO UNC% % 37  50  57   DLCO corrected ml/min/mmHg 8.06  11.29    DLCO COR %Predicted % 37  52    DLVA Predicted % 57  61  60   TLC L 4.89  5.25  5.81   TLC % Predicted % 91  98  108   RV % Predicted % 88  86  110        has a past medical history of Atherosclerosis (1/0612014), Blood transfusion without reported diagnosis (2005), Cancer ([place]) (Ovarian 1978), Chest pain, COPD (chronic obstructive pulmonary disease) ([place]), COVID-19 long hauler manifesting chronic dyspnea, Emphysema of lung ([place]) (2019), Oxygen deficiency (03/2019), and Oxygen dependent.   reports that she quit smoking about 6 years ago. Her smoking use included  cigarettes. She has a 45.00 pack-year smoking history. She has been exposed to tobacco smoke. She has never used smokeless tobacco.  Past Surgical History:  Procedure Laterality Date   ABDOMINAL HYSTERECTOMY  1978   CHOLECYSTECTOMY  2010   COLONOSCOPY     COLONOSCOPY     COLONOSCOPY WITH PROPOFOL N/A 11/01/2021   Procedure: COLONOSCOPY WITH PROPOFOL;  Surgeon: Jonathon Bellows, MD;  [facility] END[facility]COPY;  Service: Gastroenterology;  Laterality: N/A;   ESOPHAGOGASTRODUODENOSCOPY     NECK SURGERY  1999   OVARIAN CYST REMOVAL  1988   SPINE SURGERY  Neck 2002    Allergies  Allergen Reactions   Montelukast     Bad dreams    Immunization History  Administered Date(s) Administered   Fluad Quad(high Dose 65+) 03/30/2022   Influenza, High Dose Seasonal PF 03/30/2022   Influenza, Seasonal, Injecte, Preservative Fre 04/28/2008, 04/09/2010   Influenza,inj,Quad PF,6+ Mos 04/22/2019, 04/25/2020, 03/15/2021   Influenza-Unspecified 06/01/2008, 05/10/2016, 03/18/2017   Moderna Sars-Covid-2 Vaccination 07/30/2019, 08/28/2019, 06/27/2020   Pfizer Covid-19 Vaccine Bivalent Booster 54yr & up 07/28/2021   Pneumococcal Polysaccharide-23 04/15/2015, 04/22/2020   Td 10/01/2020   Tdap  11/17/2009   Zoster Recombinat (Shingrix) 04/16/2020, 06/22/2020    Family History  Problem Relation Age of Onset   Heart failure Mother    Diabetes Mother    Kidney disease Mother    Hypertension Mother    Arthritis Mother    Heart disease Mother    Obesity Mother    COPD Father    Hearing loss Father    Lung cancer Brother    COPD Brother    COPD Brother    Asthma Son    Diabetes Son    Birth defects Son    Breast cancer Maternal Aunt 40     Current Outpatient Medications:    albuterol (VENTOLIN HFA) 108 (90 Base) MCG/ACT inhaler, TAKE 2 PUFFS BY MOUTH EVERY 6 HOURS AS NEEDED FOR WHEEZE OR SHORTNESS OF BREATH, Disp: 8.5 each, Rfl: 5   ASPIRIN LOW DOSE 81 MG EC tablet, TAKE 1 TABLET BY MOUTH EVERY DAY,  Disp: 90 tablet, Rfl: 3   atorvastatin (LIPITOR) 10 MG tablet, TAKE 1 TABLET BY MOUTH EVERY DAY, Disp: 90 tablet, Rfl: 2   Azelastine HCl 137 MCG/SPRAY SOLN, USE 1 SPRAY INTO EACH NOSTRIL TWICE A DAY, Disp: 30 mL, Rfl: 1   benzonatate (TESSALON) 200 MG capsule, Take 1 capsule (200 mg total) by mouth 3 (three) times daily as needed., Disp: 60 capsule, Rfl: 3   Budeson-Glycopyrrol-Formoterol (BREZTRI AEROSPHERE) 160-9-4.8 MCG/ACT AERO, Inhale 2 puffs into the lungs in the morning and at bedtime., Disp: 10.7 g, Rfl: 11   escitalopram (LEXAPRO) 20 MG tablet, Take 1 tablet (20 mg total) by mouth daily., Disp: 90 tablet, Rfl: 1   gabapentin (NEURONTIN) 800 MG tablet, Take 1 tablet (800 mg total) by mouth 3 (three) times daily., Disp: 90 tablet, Rfl: 2   ipratropium-albuterol (DUONEB) 0.5-2.5 (3) MG/3ML SOLN, USE 1 AMPULE IN NEBULIZER 4 TIMES A DAY AS NEEDED (DX: J84.112, J44.9), Disp: 360 mL, Rfl: 5   oxybutynin (DITROPAN-XL) 10 MG 24 hr tablet, Take 1 tablet (10 mg total) by mouth daily., Disp: 30 tablet, Rfl: 11   OXYGEN, Inhale into the lungs. 3 liters at rest and 5 liters with any activity.-, Disp: , Rfl:    pantoprazole (PROTONIX) 40 MG tablet, Take 1 tablet (40 mg total) by mouth daily., Disp: 30 tablet, Rfl: 11   Respiratory Therapy Supplies (FLUTTER) DEVI, Use as directed, Disp: 1 each, Rfl: 0   roflumilast (DALIRESP) 500 MCG TABS tablet, Take 1 tablet (500 mcg total) by mouth daily., Disp: 30 tablet, Rfl: 11      Objective:   Vitals:   07/05/22 1103  BP: 118/64  Pulse: 97  SpO2: 95%  Weight: 145 lb (65.8 kg)  Height: _0  (1.651 m)    Estimated body mass index is 24.13 kg/m as calculated from the following:   Height as of this encounter: _1  (1.651 m).   Weight as of this encounter: 145 lb (65.8 kg).  _2 @  Filed Weights   07/05/22 1103  Weight: 145 lb (65.8 kg)     Physical Exam  General: No distress.  Barrel chested [name] on oxygen.  Does have a hoarse  cough periodically. Neuro: Alert and Oriented x 3. GCS 15. Speech normal Psych: Pleasant Resp:  Barrel Chest - yes.  Wheeze - no, Crackles - no, No overt respiratory distress CVS: Normal heart sounds. Murmurs - no Ext: Stigmata of Connective Tissue Disease - no HEENT: Normal upper airway. PEERL +. No post nasal drip  Assessment:       ICD-10-CM   1. Chronic respiratory failure with hypoxia (HCC)  J96.11     2. Stage 2 moderate COPD by GOLD classification ([place])  J44.9     3. Alpha-1-antitrypsin deficiency carrier  Z14.8     4. COPD exacerbation (HCC)  J44.1     5. COPD, frequent exacerbations ([place])  J44.1     6. History of 2019 novel coronavirus disease (COVID-19)  Z86.16     7. History of prolonged Q-T interval on ECG  Z87.898 EKG 12-Lead    8. Active advance directive  Z78.9          Plan:     Patient Instructions     ICD-10-CM   1. Chronic respiratory failure with hypoxia (HCC)  J96.11     2. Stage 2 moderate COPD by GOLD classification ([place])  J44.9     3. Alpha-1-antitrypsin deficiency carrier  Z14.8     4. COPD exacerbation (HCC)  J44.1     5. COPD, frequent exacerbations ([place])  J44.1     6. History of 2019 novel coronavirus disease (COVID-19)  Z86.16     7. History of prolonged Q-T interval on ECG  Z87.898      Chronic respiratory failure with hypoxia (HCC) Stage 2 moderate COPD by GOLD classification (HCC) Alpha-1-antitrypsin deficiency carrier COPD exacerbation (HCC) COPD, frequent exacerbations ([place]) History of 2019 novel coronavirus disease (COVID-19)  -You are likely in another low-grade flareup because of COVID-19 around Thanksgiving 2023 -Unfortunately you do not qualify for the [facility] COPD trial for recurrent exacerbations because of prolonged QTc on your EKG in October 2023 -We can recheck your blood eosinophil count to see if you qualify for standard of care Dupixent   PLAN -Check blood CBC with differential  -If eosinophils  are elevated we could consider Dupixent concern - Please take prednisone 40 mg x1 day, then 30 mg x1 day, then 20 mg x1 day, then 10 mg x1 day, and then 5 mg x1 day and stop -Buy over-the-counter and acetylcysteine and take 600 mg twice daily  -There is one study from [place] that shows this could potentially help with reducing COPD exacerbations  -You can get this from [facility] or vitamin store Continue on BREZTRI 2 puffs Twice daily  , rinse after use.  Continue Roflumilast daily Continue Albuterol inhaler as needed.  Continue oxygen 3 L at rest and 5 L with exertion Do spirometry and DLCO in 2-3 months  History of prolonged Q-T interval on ECG - oct 2023 - likely due to anti-dpressant  -Much improved and normal  Plan   -Continue to monitor   Active advance directive  -Noted designation of her daughter Maria [name] is the main healthcare power of attorney and also desire for natural death in case of terminal irreversible illnesses.  Plan - We will make a copy and filed in the medical record  Follow-up - Return in 2-3 months but after spirometry and DLCO    SIGNATURE    Dr. Brand Males, M.D., F.C.C.P,  Pulmonary and Critical Care Medicine Staff Physician, [place] Director - Interstitial Lung Disease  Program  Pulmonary [place] at [place], [place], [address]  Pager: [phone], If no answer or between  15:00h - 7:00h: call [phone] Telephone: [phone]  11:37 AM 07/05/2022

## 2022-07-05 NOTE — Patient Instructions (Addendum)
ICD-10-CM   1. Chronic respiratory failure with hypoxia (HCC)  J96.11     2. Stage 2 moderate COPD by GOLD classification ([place])  J44.9     3. Alpha-1-antitrypsin deficiency carrier  Z14.8     4. COPD exacerbation (HCC)  J44.1     5. COPD, frequent exacerbations ([place])  J44.1     6. History of 2019 novel coronavirus disease (COVID-19)  Z86.16     7. History of prolonged Q-T interval on ECG  Z87.898      Chronic respiratory failure with hypoxia (HCC) Stage 2 moderate COPD by GOLD classification (HCC) Alpha-1-antitrypsin deficiency carrier COPD exacerbation (HCC) COPD, frequent exacerbations ([place]) History of 2019 novel coronavirus disease (COVID-19)  -You are likely in another low-grade flareup because of COVID-19 around Thanksgiving 2023 -Unfortunately you do not qualify for the [facility] COPD trial for recurrent exacerbations because of prolonged QTc on your EKG in October 2023 -We can recheck your blood eosinophil count to see if you qualify for standard of care Dupixent   PLAN -Check blood CBC with differential  -If eosinophils are elevated we could consider Dupixent concern - Please take prednisone 40 mg x1 day, then 30 mg x1 day, then 20 mg x1 day, then 10 mg x1 day, and then 5 mg x1 day and stop -Buy over-the-counter and acetylcysteine and take 600 mg twice daily  -There is one study from [place] that shows this could potentially help with reducing COPD exacerbations  -You can get this from [facility] or vitamin store Continue on BREZTRI 2 puffs Twice daily  , rinse after use.  Continue Roflumilast daily Continue Albuterol inhaler as needed.  Continue oxygen 3 L at rest and 5 L with exertion Do spirometry and DLCO in 2-3 months  History of prolonged Q-T interval on ECG - oct 2023 - likely due to anti-dpressant  -Much improved and normal  Plan   -Continue to monitor   Active advance directive  -Noted designation of her daughter [place]ly [place] is the main healthcare power of  attorney and also desire for natural death in case of terminal irreversible illnesses.  Plan - We will make a copy and filed in the medical record  Follow-up - Return in 2-3 months but after spirometry and DLCO

## 2022-07-06 ENCOUNTER — Encounter: Payer: Self-pay | Admitting: Internal Medicine

## 2022-07-06 MED ORDER — PREDNISONE 10 MG PO TABS: ORAL_TABLET | ORAL | 0 refills | Status: DC

## 2022-07-14 DIAGNOSIS — S92355A Nondisplaced fracture of fifth metatarsal bone, left foot, initial encounter for closed fracture: Secondary | ICD-10-CM | POA: Diagnosis not present

## 2022-07-24 ENCOUNTER — Other Ambulatory Visit: Payer: Self-pay | Admitting: Internal Medicine

## 2022-07-24 DIAGNOSIS — J9611 Chronic respiratory failure with hypoxia: Secondary | ICD-10-CM | POA: Diagnosis not present

## 2022-07-24 DIAGNOSIS — J449 Chronic obstructive pulmonary disease, unspecified: Secondary | ICD-10-CM | POA: Diagnosis not present

## 2022-07-25 NOTE — Progress Notes (Signed)
MRN : [id number]  Maria [name] is a 67 y.o. (06/22/1956) female who presents with chief complaint of check carotid arteries.  History of Present Illness:   The patient is seen for follow up evaluation of carotid stenosis. The carotid stenosis followed by ultrasound.    The patient denies amaurosis fugax. There is no recent history of TIA symptoms or focal motor deficits. There is no prior documented CVA.   The patient is taking enteric-coated aspirin 81 mg daily.   There is no history of migraine headaches. There is no history of seizures.   No recent shortening of the patient's walking distance or new symptoms consistent with claudication.  No history of rest pain symptoms. No new ulcers or wounds of the lower extremities have occurred.   There is no history of DVT, PE or superficial thrombophlebitis. No recent episodes of angina or shortness of breath documented.    Carotid Duplex done today shows RICA 2-35% and LICA 57-32%.  No change compared to last study in 05/2021   No outpatient medications have been marked as taking for the 07/28/22 encounter (Appointment) with Delana Meyer, Dolores Lory, MD.    Past Medical History:  Diagnosis Date   Atherosclerosis [phone]   aorta, iliacs and CFA bilaterally no greater than 0-49% - lower arterial doppler.   Blood transfusion without reported diagnosis 2005   Cancer [facility]) Ovarian 1978   Chest pain    COPD (chronic obstructive pulmonary disease) ([place])    COVID-19 long hauler manifesting chronic dyspnea    Emphysema of lung ([place]) 2019   Oxygen deficiency 03/2019   Oxygen dependent    3 liters up to 5    Past Surgical History:  Procedure Laterality Date   [place]  2010   COLONOSCOPY     COLONOSCOPY     COLONOSCOPY WITH PROPOFOL N/A 11/01/2021   Procedure: COLONOSCOPY WITH PROPOFOL;  Surgeon: Jonathon Bellows, MD;  Location: [facility] ENDOSCOPY;  Service: Gastroenterology;  Laterality: N/A;    ESOPHAGOGASTRODUODENOSCOPY     NECK SURGERY  1999   OVARIAN CYST REMOVAL  1988   SPINE SURGERY  Neck 2002    Social History Social History   Tobacco Use   Smoking status: Former    Packs/day: 1.00    Years: 45.00    Total pack years: 45.00    Types: Cigarettes    Quit date: 03/18/2016    Years since quitting: 6.3    Passive exposure: Past   Smokeless tobacco: Never  Vaping Use   Vaping Use: Never used  Substance Use Topics   Alcohol use: No    Alcohol/week: 0.0 standard drinks of alcohol   Drug use: No    Family History Family History  Problem Relation Age of Onset   Heart failure Mother    Diabetes Mother    Kidney disease Mother    Hypertension Mother    Arthritis Mother    Heart disease Mother    Obesity Mother    COPD Father    Hearing loss Father    Lung cancer Brother    COPD Brother    COPD Brother    Asthma Son    Diabetes Son    Birth defects Son    Breast cancer Maternal Aunt 40    Allergies  Allergen Reactions   Montelukast     Bad dreams     REVIEW OF SYSTEMS (Negative unless checked)  Constitutional: '[]'$ Weight loss  '[]'$   Fever  '[]'$ Chills Cardiac: '[]'$ Chest pain   '[]'$ Chest pressure   '[]'$ Palpitations   '[]'$ Shortness of breath when laying flat   '[]'$ Shortness of breath with exertion. Vascular:  '[x]'$ Pain in legs with walking   '[]'$ Pain in legs at rest  '[x]'$ History of DVT   '[]'$ Phlebitis   '[]'$ Swelling in legs   '[]'$ Varicose veins   '[]'$ Non-healing ulcers Pulmonary:   '[]'$ Uses home oxygen   '[]'$ Productive cough   '[]'$ Hemoptysis   '[]'$ Wheeze  '[x]'$ COPD   '[]'$ Asthma Neurologic:  '[]'$ Dizziness   '[]'$ Seizures   '[]'$ History of stroke   '[]'$ History of TIA  '[]'$ Aphasia   '[]'$ Vissual changes   '[x]'$ Weakness or numbness in arm   '[]'$ Weakness or numbness in leg Musculoskeletal:   '[]'$ Joint swelling   '[]'$ Joint pain   '[]'$ Low back pain Hematologic:  '[]'$ Easy bruising  '[]'$ Easy bleeding   '[]'$ Hypercoagulable state   '[]'$ Anemic Gastrointestinal:  '[]'$ Diarrhea   '[]'$ Vomiting  '[]'$ Gastroesophageal reflux/heartburn   '[]'$ Difficulty  swallowing. Genitourinary:  '[]'$ Chronic kidney disease   '[]'$ Difficult urination  '[]'$ Frequent urination   '[]'$ Blood in urine Skin:  '[]'$ Rashes   '[]'$ Ulcers  Psychological:  '[]'$ History of anxiety   '[]'$  History of major depression.  Physical Examination  There were no vitals filed for this visit. There is no height or weight on file to calculate BMI. Gen: WD/WN, NAD Head: [place]/AT, No temporalis wasting.  Ear/Nose/Throat: Hearing grossly intact, nares w/o erythema or drainage Eyes: PER, EOMI, sclera nonicteric.  Neck: Supple, no masses.  No bruit or JVD.  Pulmonary:  Good air movement, no audible wheezing, no use of accessory muscles.  Cardiac: RRR, normal S1, S2, no Murmurs. Vascular:  carotid bruit noted Vessel Right Left  Radial Palpable Palpable  Carotid  Palpable  Palpable  Subclav  Palpable Palpable  Gastrointestinal: soft, non-distended. No guarding/no peritoneal signs.  Musculoskeletal: M/S 5/5 throughout.  No visible deformity.  Neurologic: CN 2-12 intact. Pain and light touch intact in extremities.  Symmetrical.  Speech is fluent. Motor exam as listed above. Psychiatric: Judgment intact, Mood & affect appropriate for pt's clinical situation. Dermatologic: No rashes or ulcers noted.  No changes consistent with cellulitis.   CBC Lab Results  Component Value Date   WBC 11.7 (H) 07/05/2022   HGB 11.2 (L) 07/05/2022   HCT 33.5 (L) 07/05/2022   MCV 93.4 07/05/2022   PLT 386.0 07/05/2022    BMET    Component Value Date/Time   NA 138 03/31/2022 1019   K 3.8 03/31/2022 1019   CL 101 03/31/2022 1019   CO2 28 03/31/2022 1019   GLUCOSE 89 03/31/2022 1019   BUN 10 03/31/2022 1019   CREATININE 0.69 03/31/2022 1019   CALCIUM 9.4 03/31/2022 1019   GFRNONAA >60 03/03/2019 0154   GFRAA >60 03/03/2019 0154   CrCl cannot be calculated (Patient's most recent lab result is older than the maximum 21 days allowed.).  COAG Lab Results  Component Value Date   INR 1.0 10/03/2008   INR 1.0  10/02/2008    Radiology No results found.   Assessment/Plan 1. Bilateral carotid artery stenosis Recommend:   Given the patient's asymptomatic subcritical stenosis no further invasive testing or surgery at this time.  I believe her symptoms are related to the DJD of the cervical and possibly LS spine.   Duplex ultrasound shows 1-39% RICA stenosis and 69-67% LICA stenosis.   Continue antiplatelet therapy as prescribed Continue management of CAD, HTN and Hyperlipidemia Healthy heart diet,  encouraged exercise at least 4 times per week Follow up in 6 months with  duplex ultrasound and physical exam   - VAS US CAROTID; Future  2. Atherosclerosis of native artery of both lower extremities with intermittent claudication (HCC) Recommend:   The patient has evidence of atherosclerosis of the lower extremities with claudication.  The patient does not voice lifestyle limiting changes at this point in time.   Noninvasive studies do not suggest clinically significant change.   No invasive studies, angiography or surgery at this time The patient should continue walking and begin a more formal exercise program.  The patient should continue antiplatelet therapy and aggressive treatment of the lipid abnormalities   No changes in the patient's   3. Stage 2 moderate COPD by GOLD classification ([place]) Continue pulmonary medications and aerosols as already ordered, these medications have been reviewed and there are no changes at this time.   4. Mixed hyperlipidemia Continue statin as ordered and reviewed, no changes at this time    Hortencia Pilar, MD  07/25/2022 6:28 PM

## 2022-07-28 ENCOUNTER — Ambulatory Visit (INDEPENDENT_AMBULATORY_CARE_PROVIDER_SITE_OTHER): Payer: Medicare HMO

## 2022-07-28 ENCOUNTER — Ambulatory Visit (INDEPENDENT_AMBULATORY_CARE_PROVIDER_SITE_OTHER): Payer: Medicare HMO | Admitting: Vascular Surgery

## 2022-07-28 VITALS — BP 171/69 | HR 67 | Ht 65.0 in | Wt 145.0 lb

## 2022-07-28 DIAGNOSIS — J449 Chronic obstructive pulmonary disease, unspecified: Secondary | ICD-10-CM

## 2022-07-28 DIAGNOSIS — I6523 Occlusion and stenosis of bilateral carotid arteries: Secondary | ICD-10-CM

## 2022-07-28 DIAGNOSIS — E782 Mixed hyperlipidemia: Secondary | ICD-10-CM

## 2022-07-28 DIAGNOSIS — I70213 Atherosclerosis of native arteries of extremities with intermittent claudication, bilateral legs: Secondary | ICD-10-CM | POA: Diagnosis not present

## 2022-07-29 ENCOUNTER — Encounter (INDEPENDENT_AMBULATORY_CARE_PROVIDER_SITE_OTHER): Payer: Self-pay | Admitting: Vascular Surgery

## 2022-07-29 ENCOUNTER — Telehealth: Payer: Self-pay | Admitting: Family Medicine

## 2022-07-29 NOTE — Telephone Encounter (Signed)
Per Dr. Matilde Sprang she is to start PTNS treatment beginning in March. No PA required and I have tried to reach patient several times to get her scheduled. I left messages and have not received a phone call back.

## 2022-08-09 ENCOUNTER — Encounter: Payer: Self-pay | Admitting: Student in an Organized Health Care Education/Training Program

## 2022-08-09 ENCOUNTER — Ambulatory Visit
Payer: Medicare HMO | Attending: Student in an Organized Health Care Education/Training Program | Admitting: Student in an Organized Health Care Education/Training Program

## 2022-08-09 ENCOUNTER — Other Ambulatory Visit: Payer: Self-pay | Admitting: Student in an Organized Health Care Education/Training Program

## 2022-08-09 VITALS — BP 135/56 | HR 59 | Temp 97.3°F | Resp 14 | Ht 65.0 in | Wt 145.0 lb

## 2022-08-09 DIAGNOSIS — G8929 Other chronic pain: Secondary | ICD-10-CM | POA: Insufficient documentation

## 2022-08-09 DIAGNOSIS — M25511 Pain in right shoulder: Secondary | ICD-10-CM | POA: Diagnosis not present

## 2022-08-09 DIAGNOSIS — S92352A Displaced fracture of fifth metatarsal bone, left foot, initial encounter for closed fracture: Secondary | ICD-10-CM | POA: Insufficient documentation

## 2022-08-09 DIAGNOSIS — M5010 Cervical disc disorder with radiculopathy, unspecified cervical region: Secondary | ICD-10-CM | POA: Diagnosis not present

## 2022-08-09 DIAGNOSIS — G894 Chronic pain syndrome: Secondary | ICD-10-CM | POA: Diagnosis not present

## 2022-08-09 DIAGNOSIS — Q761 Klippel-Feil syndrome: Secondary | ICD-10-CM

## 2022-08-09 DIAGNOSIS — S92352S Displaced fracture of fifth metatarsal bone, left foot, sequela: Secondary | ICD-10-CM | POA: Insufficient documentation

## 2022-08-09 MED ORDER — GABAPENTIN 600 MG PO TABS: 600.0000 mg | ORAL_TABLET | Freq: Three times a day (TID) | ORAL | 2 refills | Status: DC

## 2022-08-09 NOTE — Progress Notes (Signed)
Nursing Pain Medication Assessment:  Safety precautions to be maintained throughout the outpatient stay will include: orient to surroundings, keep bed in low position, maintain call bell within reach at all times, provide assistance with transfer out of bed and ambulation.

## 2022-08-09 NOTE — Progress Notes (Signed)
PROVIDER NOTE: Information contained herein reflects review and annotations entered in association with encounter. Interpretation of such information and data should be left to medically-trained personnel. Information provided to patient can be located elsewhere in the medical record under "Patient Instructions". Document created using STT-dictation technology, any transcriptional errors that may result from process are unintentional.    Patient: Maria [name]  Service Category: E/M  Provider: Gillis Santa, MD  DOB: [birth year]  DOS: 08/09/2022  Referring Provider: Waunita Schooner, MD  MRN: [id number]  Specialty: Interventional Pain Management  PCP: Michela Pitcher, NP  Type: Established Patient  Setting: Ambulatory outpatient    Location: Office  Delivery: Face-to-face     HPI  [name], a 67 y.o. year old female, is here today because of her Cervical fusion syndrome [Q76.1]. Ms. Coderre primary complain today is Neck Pain (right) Last encounter: My last encounter with her was on 05/17/22 Pertinent problems: [name] has Anxiety; Numbness and tingling in both hands; History of cervical spinal surgery; Radicular pain in right arm; Cervical fusion syndrome (C6-7); Cervical disc disorder with radiculopathy of cervical region (right); Requires continuous at home supplemental oxygen (3L)  COPD; and Chronic pain syndrome on their pertinent problem list. Pain Assessment: Severity of Chronic pain is reported as a 8 /10. Location: Neck Right/right shoulder and arm. Onset: More than a month ago. Quality: Sharp. Timing: Constant. Modifying factor(s): nothing, pain med not helping. Vitals:  height is '5\' 5"'$  (1.651 m) and weight is 145 lb (65.8 kg). Her temporal temperature is 97.3 F (36.3 C) (abnormal). Her blood pressure is 135/56 (abnormal) and her pulse is 59 (abnormal). Her respiration is 14 and oxygen saturation is 100%.   Reason for encounter:   Since her last visit with me she unfortunately sustained  a closed fracture of fifth metatarsal bone of left foot. Boot in place, being followed by [facility]. Experiencing swelling and pain of her left foot. Has f/u with [facility] Thurs- planning for non-op management Is not endorsing any additional benefit with Gabapentin increase from 600 mg TID to 800 mg TID. States that it is causing insomnia. Will reduce dose back to 600 mg TID. She is also inquiring about a TENS unit and I'll have DME rep talk with her about this today as he is in clinic  05/17/22 Patient follows up today endorsing any significant pain relief functional benefit after gabapentin titration to 600 mg 3 times a day.  She is not having effects including vision changes, lower extremity swelling, changes, sedation.  We discussed increasing her dose to 800 mg 3 times a day.  Consider transition to Lyrica.  She is leaving for a cruise this weekend.  She states that her husband is retiring tomorrow and they are very excited about that.  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  She describes severe difficulty in cervical lateral motion and cervical extension.  She is on home oxygen.  She has COPD.  She is on gabapentin 300 mg 3 times a day.  She would like to avoid any opioid analgesics or sedative medications.  She has had trigger point injections in the past as well as physical therapy.  She has tried to do home physical therapy exercises but they have proved too difficult and have increased her pain.  MRI findings are below.  She also has a history of depression.  ROS  Constitutional: Denies any fever or chills Gastrointestinal: No reported hemesis, hematochezia, vomiting, or acute GI distress Musculoskeletal:  Right arm pain, left foot pain Neurological: No reported episodes of acute onset apraxia, aphasia,  dysarthria, agnosia, amnesia, paralysis, loss of coordination, or loss of consciousness  Medication Review  Azelastine HCl, Budeson-Glycopyrrol-Formoterol, Flutter, OVER THE COUNTER MEDICATION, Oxygen-Helium, Potassium, Vitamin D-3, albuterol, aspirin EC, atorvastatin, benzonatate, escitalopram, gabapentin, ipratropium-albuterol, oxybutynin, pantoprazole, and roflumilast  History Review  Allergy: [name] is allergic to montelukast. Drug: [name]  reports no history of drug use. Alcohol:  reports no history of alcohol use. Tobacco:  reports that she quit smoking about 6 years ago. Her smoking use included cigarettes. She has a 45.00 pack-year smoking history. She has been exposed to tobacco smoke. She has never used smokeless tobacco. Social: [name]  reports that she quit smoking about 6 years ago. Her smoking use included cigarettes. She has a 45.00 pack-year smoking history. She has been exposed to tobacco smoke. She has never used smokeless tobacco. She reports that she does not drink alcohol and does not use drugs. Medical:  has a past medical history of Atherosclerosis (1/[id number]), Blood transfusion without reported diagnosis (2005), Cancer ([place]) (Ovarian 1978), Chest pain, COPD (chronic obstructive pulmonary disease) ([place]), COVID-19 long hauler manifesting chronic dyspnea, Emphysema of lung ([place]) (2019), Oxygen deficiency (03/2019), and Oxygen dependent. Surgical: [name]  has a past surgical history that includes Abdominal hysterectomy (1978); Ovarian cyst removal (1988); Neck surgery (1999); Spine surgery (Neck 2002); Cholecystectomy (2010); Colonoscopy; Colonoscopy; Esophagogastroduodenoscopy; and Colonoscopy with propofol (N/A, 11/01/2021). Family: family history includes Arthritis in her mother; Asthma in her son; Birth defects in her son; Breast cancer (age of onset: 43) in her maternal aunt; COPD in her brother, brother, and father; Diabetes in her mother and son; Hearing loss in her  father; Heart disease in her mother; Heart failure in her mother; Hypertension in her mother; Kidney disease in her mother; Lung cancer in her brother; Obesity in her mother.  Laboratory Chemistry Profile   Renal Lab Results  Component Value Date   BUN 10 03/31/2022   CREATININE 0.69 03/31/2022   GFR 91.27 03/31/2022   GFRAA >60 03/03/2019   GFRNONAA >60 03/03/2019    Hepatic Lab Results  Component Value Date   AST 20 10/15/2021   ALT 17 10/15/2021   ALBUMIN 4.2 10/15/2021   ALKPHOS 72 10/15/2021   LIPASE 19 11/09/2016    Electrolytes Lab Results  Component Value Date   NA 138 03/31/2022   K 3.8 03/31/2022   CL 101 03/31/2022   CALCIUM 9.4 03/31/2022   MG 2.0 03/19/2019   PHOS 3.4 03/19/2019    Bone Lab Results  Component Value Date   VD25OH 35.22 06/03/2019    Inflammation (CRP: Acute Phase) (ESR: Chronic Phase) Lab Results  Component Value Date   CRP 1.3 (H) 03/03/2019   ESRSEDRATE 41 (H) 07/03/2019         Note: Above Lab results reviewed.  Recent Imaging Review  VAS US CAROTID Carotid Arterial Duplex Study  Patient Name:  ARYAA BUNTING  Date of Exam:   07/28/2022 Medical Rec #: [id number]       Accession #:    [id number] Date of Birth: 1956-01-10        Patient Gender: F Patient Age:   19 years Exam Location:  [facility] Procedure:      VAS US  CAROTID Referring Phys: [facility]  --------------------------------------------------------------------------------   Indications:   Carotid artery disease. Other Factors: History of right vertebral retrograde.  Performing Technologist: Delorise Shiner RVT    Examination Guidelines: A complete evaluation includes B-mode imaging, spectral Doppler, color Doppler, and power Doppler as needed of all accessible portions of each vessel. Bilateral testing is considered an integral part of a complete examination. Limited examinations for reoccurring indications may be performed as noted.     Right Carotid Findings: +----------+--------+--------+--------+----------------------+--------+           PSV cm/sEDV cm/sStenosisPlaque Description    Comments +----------+--------+--------+--------+----------------------+--------+ CCA Prox  122     18                                             +----------+--------+--------+--------+----------------------+--------+ CCA Mid   111     19                                             +----------+--------+--------+--------+----------------------+--------+ CCA Distal117     24                                             +----------+--------+--------+--------+----------------------+--------+ ICA Prox  142     25      1-39%   irregular and calcific         +----------+--------+--------+--------+----------------------+--------+ ICA Mid   116     25                                             +----------+--------+--------+--------+----------------------+--------+ ICA Distal123     25                                             +----------+--------+--------+--------+----------------------+--------+ ECA       142     9                                              +----------+--------+--------+--------+----------------------+--------+  +----------+--------+-------+--------+-------------------+           PSV cm/sEDV cmsDescribeArm Pressure (mmHG) +----------+--------+-------+--------+-------------------+ [id number]     0      Stenotic                    +----------+--------+-------+--------+-------------------+  +---------+--------+--+--------+----------+ VertebralPSV cm/s43EDV cm/sRetrograde +---------+--------+--+--------+----------+     Left Carotid Findings: +----------+--------+--------+--------+----------------------+--------+           PSV cm/sEDV cm/sStenosisPlaque Description     Comments +----------+--------+--------+--------+----------------------+--------+ CCA Prox  106     16                                             +----------+--------+--------+--------+----------------------+--------+ CCA Mid  112     22                                             +----------+--------+--------+--------+----------------------+--------+ CCA Distal107     26              irregular and calcific         +----------+--------+--------+--------+----------------------+--------+ ICA Prox  355     82      60-79%  calcific and irregular         +----------+--------+--------+--------+----------------------+--------+ ICA Mid   163     41                                             +----------+--------+--------+--------+----------------------+--------+ ICA Distal86      22                                             +----------+--------+--------+--------+----------------------+--------+ ECA       267     26      >50%    calcific and irregular         +----------+--------+--------+--------+----------------------+--------+  +----------+--------+--------+--------+-------------------+           PSV cm/sEDV cm/sDescribeArm Pressure (mmHG) +----------+--------+--------+--------+-------------------+ Subclavian185                                         +----------+--------+--------+--------+-------------------+  +---------+--------+---+--------+--+ VertebralPSV cm/s119EDV cm/s22 +---------+--------+---+--------+--+        Summary: Right Carotid: Velocities in the right ICA are consistent with a 1-39% stenosis.  Left Carotid: Velocities in the left ICA are consistent with a 60-79% stenosis.               The ECA appears >50% stenosed.  Vertebrals:  Left vertebral artery demonstrates antegrade flow. Right vertebral              artery demonstrates retrograde flow. Subclavians: Right subclavian artery was stenotic.  Normal flow hemodynamics were              seen in the left subclavian artery.  *See table(s) above for measurements and observations.    Electronically signed by Hortencia Pilar MD on 07/28/2022 at 4:32:57 PM.      Final   Note: Reviewed        Physical Exam  General appearance: Well nourished, well developed, and well hydrated. In no apparent acute distress Mental status: Alert, oriented x 3 (person, place, & time)        Eyes: PERLA Vitals: BP (!) 135/56   Pulse (!) 59   Temp (!) 97.3 F (36.3 C) (Temporal)   Resp 14   Ht '5\' 5"'$  (1.651 m)   Wt 145 lb (65.8 kg)   SpO2 100% Comment: O2 at 3L BNC  BMI 24.13 kg/m  BMI: Estimated body mass index is 24.13 kg/m as calculated from the following:   Height as of this encounter: '5\' 5"'$  (1.651 m).   Weight as of this encounter: 145 lb (65.8 kg). Ideal: Ideal body weight: 57 kg (125 lb 10.6  oz) Adjusted ideal body weight: 60.5 kg (133 lb 6.4 oz)  Respiratory: Oxygen-dependent COPD 3 L oxygen continuous   Cervical Spine Area Exam  Skin & Axial Inspection: Well healed scar from previous spine surgery detected Alignment: Symmetrical Functional ROM: Pain restricted ROM     severely limited on the right Stability: No instability detected Muscle Tone/Strength: Functionally intact. No obvious neuro-muscular anomalies detected. Sensory (Neurological): Neurogenic pain pattern Palpation: No palpable anomalies             Positive Spurling's on the right   Upper Extremity (UE) Exam      Side: Right upper extremity   Side: Left upper extremity  Skin & Extremity Inspection: Skin color, temperature, and hair growth are WNL. No peripheral edema or cyanosis. No masses, redness, swelling, asymmetry, or associated skin lesions. No contractures.   Skin & Extremity Inspection: Skin color, temperature, and hair growth are WNL. No peripheral edema or cyanosis. No masses, redness, swelling, asymmetry, or associated skin lesions. No contractures.   Functional ROM: Pain restricted ROM for shoulder and elbow   Functional ROM: Unrestricted ROM          Muscle Tone/Strength: Functionally intact. No obvious neuro-muscular anomalies detected.   Muscle Tone/Strength: Functionally intact. No obvious neuro-muscular anomalies detected.  Sensory (Neurological): Neurogenic pain pattern           Sensory (Neurological): Unimpaired          Palpation: No palpable anomalies               Palpation: No palpable anomalies              Provocative Test(s):  Phalen's test: deferred Tinel's test: deferred Apley's scratch test (touch opposite shoulder):  Action 1 (Across chest): Decreased ROM Action 2 (Overhead): Decreased ROM Action 3 (LB reach): Decreased ROM     Provocative Test(s):  Phalen's test: deferred Tinel's test: deferred Apley's scratch test (touch opposite shoulder):  Action 1 (Across chest): deferred Action 2 (Overhead): deferred Action 3 (LB reach): deferred       Left foot in boot due to metatarsal fracture, swelling  Assessment   Diagnosis Status  1. Cervical fusion syndrome (C6-7)   2. Chronic right shoulder pain   3. Cervical disc disorder with radiculopathy of cervical region (right)   4. Closed displaced fracture of fifth metatarsal bone of left foot, sequela   5. Chronic pain syndrome     Persistent Persistent Persistent   Updated Problems: Problem  Closed Displaced Fracture of Fifth Metatarsal Bone of Left Foot      Plan of Care    Ms. ESTERA OZIER has a current medication list which includes the following long-term medication(s): albuterol, atorvastatin, azelastine hcl, escitalopram, gabapentin, ipratropium-albuterol, pantoprazole, potassium, and roflumilast.  Maria [name] is a pleasant 67 year old female who presents with a chief complaint of cervical spine pain with radiation into her right arm down to her thumb and index finger predominantly.  She has severely limited range of motion.  She has a history of  cervical spinal fusion at C6-C7.  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  She has done home directed physical therapy exercises in the past but due to pain and severely limited range of motion, she was unable to participate in them completely.  She is on 3 L of home oxygen due to COPD.  She is high risk for surgery and not deemed  a surgical candidate.   Last visit we increased her Gabapentin to 800 mg TID which is causing insomnia and no additional pain relief. Will reduce back down to 600 mg TID  Discussed TENS unit for neck.   I also reviewed her x-rays.  She has mild glenohumeral arthritis in both shoulders.  She has multilevel cervical degenerative disc disease with endplate changes.  I discussed an interlaminar cervical epidural steroid injection for right cervical radicular pain.  I also discussed diagnostic cervical facet medial branch nerve blocks for cervical generative disc disease and cervical arthropathy. She will try TENS unit first and if not effective, will consider injections.   Pharmacotherapy (Medications Ordered): Meds ordered this encounter  Medications   gabapentin (NEURONTIN) 600 MG tablet    Sig: Take 1 tablet (600 mg total) by mouth every 8 (eight) hours.    Dispense:  90 tablet    Refill:  2    Fill one day early if pharmacy is closed on scheduled refill date. May substitute for generic if available.   Orders:  No orders of the defined types were placed in this encounter.  Follow-up plan:   Return in about 3 months (around 11/08/2022) for Medication Management, in person (also dicuss injections- C-ESI, vs fcts).     Future interventional options: -Right cervical ESI -Cervical facet medial branch nerve blocks -Glenohumeral joint injection -Suprascapular nerve block  Also consider trial of Lyrica   Recent Visits Date Type Provider Dept  05/17/22 Office Visit Gillis Santa, MD [facility]  Showing recent visits within past 90 days and meeting all other requirements Today's Visits Date Type Provider Dept  08/09/22 Office Visit Gillis Santa, MD [facility]  Showing today's visits and meeting all other requirements Future Appointments No visits were found meeting these conditions. Showing future appointments within next 90 days and meeting all other requirements  I discussed the assessment and treatment plan with the patient. The patient was provided an opportunity to ask questions and all were answered. The patient agreed with the plan and demonstrated an understanding of the instructions.  Patient advised to call back or seek an in-person evaluation if the symptoms or condition worsens.  Duration of encounter: 2mnutes.  Total time on encounter, as per AMA guidelines included both the face-to-face and non-face-to-face time personally spent by the physician and/or other qualified health care professional(s) on the day of the encounter (includes time in activities that require the physician or other qualified health care professional and does not include time in activities normally performed by clinical staff). Physician's time may include the following activities when performed: preparing to see the patient (eg, review of tests, pre-charting review of records) obtaining and/or reviewing separately obtained history performing a medically appropriate examination and/or evaluation counseling and educating the patient/family/caregiver ordering medications, tests, or procedures referring and communicating with other health care professionals (when not separately reported) documenting clinical information in the electronic or other health record independently interpreting results (not separately reported) and communicating results to the patient/ family/caregiver care coordination (not separately reported)  Note by: BGillis Santa MD Date:  08/09/2022; Time: 9:20 AM

## 2022-08-10 ENCOUNTER — Encounter (INDEPENDENT_AMBULATORY_CARE_PROVIDER_SITE_OTHER): Payer: Medicare HMO | Admitting: Internal Medicine

## 2022-08-10 ENCOUNTER — Encounter: Payer: Medicare HMO | Admitting: *Deleted

## 2022-08-10 DIAGNOSIS — M25511 Pain in right shoulder: Secondary | ICD-10-CM | POA: Diagnosis not present

## 2022-08-10 DIAGNOSIS — Z006 Encounter for examination for normal comparison and control in clinical research program: Secondary | ICD-10-CM

## 2022-08-10 DIAGNOSIS — Q761 Klippel-Feil syndrome: Secondary | ICD-10-CM | POA: Diagnosis not present

## 2022-08-10 DIAGNOSIS — G894 Chronic pain syndrome: Secondary | ICD-10-CM | POA: Diagnosis not present

## 2022-08-10 DIAGNOSIS — J441 Chronic obstructive pulmonary disease with (acute) exacerbation: Secondary | ICD-10-CM

## 2022-08-10 DIAGNOSIS — S92352S Displaced fracture of fifth metatarsal bone, left foot, sequela: Secondary | ICD-10-CM | POA: Diagnosis not present

## 2022-08-10 DIAGNOSIS — M5010 Cervical disc disorder with radiculopathy, unspecified cervical region: Secondary | ICD-10-CM | POA: Diagnosis not present

## 2022-08-11 DIAGNOSIS — S92352A Displaced fracture of fifth metatarsal bone, left foot, initial encounter for closed fracture: Secondary | ICD-10-CM | POA: Diagnosis not present

## 2022-08-11 NOTE — Research (Signed)
LATE ENTRY:   Title: A Phase III, randomized, double-blind, placebo controlled, multicenter study to evaluate the efficacy and safety of astegolimab in patients with chronic obstructive pulmonary disease    Dose and Duration of Treatment: Astegolimab is presented as a sterile, slightly brown-yellow solution. Each single-use, 2.25 mL pre-filled syringe contains 1.7 mL deliverable volume. Astegolimab drug product is formulated at 140 mg/mL astegolimab with 114 mM succinic acid, 200 mM L-arginine, 10 mM L-methionine, 0.06% (w/v) polysorbate 20, pH 5.7. Placebo for astegolimab is supplied in an identical pre-filled syringe configuration.   Protocol # [id number]   Sponsor: F[employer] [place], [place]    Conservator, museum/gallery: Astegolimab [phone])   Mechanism of action: Astegolimab (also known as [id number] or [id number]) is a fully human, IgG2 monoclonal antibody that binds with high affinity to the interleukin (IL)-33 (IL-33) receptor, ST2, thereby blocking the signaling of IL-33, an inflammatory cytokine of the IL-1 family and member of the "alarmin" class of molecules. Astegolimab binds with high affinity to the human and cynomolgus monkey receptor for IL-33, ST2, and blocks IL-33 binding, thus inhibiting association with the IL-1R accessory protein (AcP) co-receptor and formation of an activated receptor complex.   Key Inclusion Criteria: Age 64-80 years at Visit 1   Documented COPD diagnosis for ?12 months prior to Visit 1 History of frequent exacerbations, defined as having had 2 or more moderate or severe COPD exacerbations within 12 months prior to Visit 1 Post-bronchodilator FEV1 ?20% and <80% of predicted at Visit 1 or Visit 2  Post-bronchodilator FEV1/FVC <0.70 at Visit 1 or Visit 2  mMRC score ?2 at screening   Current tobacco smoker or former smoker (having stopped smoking for at least 6 months prior to Visit 1) with a history of smoking  ?10 pack-years On optimized COPD maintenance therapy as defined below for ?12 months prior to Visit 1            -Inhaled corticosteroid (ICS) plus long-acting beta-agonist (LABA)           -Long-acting muscarinic antagonist (LAMA) plus LABA           -ICS plus LAMA plus LABA  Key Exclusion Criteria: Current documented diagnosis of asthma  Diagnosis of a-1 antitrypsin deficiency  History of long-term treatment with oxygen at >4.0 liters/minute  Any infection that resulted in hospital admission for ?24 hours and/or treatment with oral, IV, or IM antibiotics within 4 weeks prior to Visit 1 or during screening Upper or lower respiratory tract infection within 4 weeks prior to Visit 1 or during screening Treatment with oral, IV, or IM corticosteroids (>10 mg/day prednisolone or equivalent) within 4 weeks prior to initiation of study drug Treatment with a licensed biologic agent (e.g., omalizumab, dupilumab, and/or anti-IL-5 therapies) within 3 months or 5 drug-elimination half-lives (whichever is longer) prior to screening  Planned surgical intervention during the study Known immunodeficiency, including but not limited to, HIV infection  AST, ALT, or total bilirubin elevation ?2.0 x the upper limit of normal (ULN) during screening  History of malignancy within 5 years prior to screening, with the exception of malignancies with a negligible risk of metastasis or death (e.g., 5-year overall survival rate >90%), such as adequately treated carcinoma in situ of the cervix, non-melanoma carcinoma, localized prostate cancer, or ductal carcinoma in situ Unstable cardiac disease, myocardial infarction, or New York Heart Association Class III or IV heart failure within 12 months prior to screening History or absence of an  abnormal ECG that is deemed clinically significant by the investigator, including complete left bundle branch block or second- or third-degree atrioventricular heart block     Integrated  Pharmacokinetic/Pharmacodynamic Analysis:  In Studies FB51025, [id number], and [id number], exploratory biomarker analysis showed there were consistent decreases in blood eosinophil counts throughout the treatment period, potentially mediated by a direct effect of IL-33 on eosinophil progenitors. In [place], there was no significant difference in fractional exhaled nitric oxide (FeNO) levels (reflecting airway IL-4/IL-13 activity) between astegolimab-treated groups relative to placebo throughout the treatment period. These data suggest that astegolimab has only a limited effect on Type 2 inflammation in asthma. No data are applicable for Study [id number]. No data are available yet for Study [id number].  Special Warnings/Considerations: Administration of astegolimab, a protein therapeutic, may lead to the development of anti-astegolimab antibodies which could lead to AEs and/or decreased exposure. In non-clinical studies (see Section 4.2), ADA incidence has generally been low and there was no apparent ADA impact on PK and safety in these studies. To date, the immunogenicity rates observed with astegolimab in clinical studies have been relatively low as well (see Immunogenicity Section 5.6). Several clinical studies have been conducted in patients with asthma, atopic dermatitis, COPD (IIS Study [id number]) and COVID-19 severe pneumonia, which generally showed low incidence of ADAs (Table 31). There has been no correlation between ADA status and clinical findings or increased incidence of AEs. Astegolimab is now being considered in a larger study for the treatment of COPD. This patient population is typically considered to have a hyper-responsive immune system. Route of administration for this molecule will be [place], either Q2W or Q4W. These factors increase the risk of development of an immune response to astegolimab, specifically with repeat dosing. From the previous IIS Study [id number], incidence of ADAs was low in COPD  patients; this remains to be confirmed in a larger study. To monitor ADA development in ongoing studies, serum samples will be collected from patients at protocol-defined intervals. Patients who test positive for antibodies and have clinical sequelae that are considered potentially related to an ADA response may also be asked to return for additional follow-up testing.  Drug Interaction Studies: No PK drug interaction studies have been conducted to date.  Serious Adverse Reactions Observed in Asthma Study: During the treatment period, a similar proportion of patients across all cohorts experienced at least one AE, regardless of causality (Table 22). In total, 77.2%, 70.9%, 72.2%, and 72.1% of patients reported at least 1 AE in the placebo, 70 mg, 210 mg, and 490 mg groups, respectively. The most common AE's (>5% in any treatment group) were asthma, nasopharyngitis, upper respiratory tract infection, headache, and injection site reaction (ISR). The most common drug-related AE was ISR, which was reported more frequently in the astegolimab treatment groups than in the placebo group (1 [0.8%] patient in the placebo group, 10 [7.9%] patients with 70 mg, 8 [6.3%] patients with 210 mg, 6 [4.9%] patients with 490 mg). All ISRs were non-serious and mild or moderate in severity. During the treatment period, 50 SAEs were reported in 37 (7.4%) patients. The number of patients reporting SAEs was comparable across all cohorts (11 SAEs in 8 patients on placebo, 21 SAEs in 14 patients on 70 mg, 11 SAEs in 9 patients on 210 mg, and 7 SAEs in 6 patients on 490 mg). The most common SAE was asthma. One SAE of moderate livedo reticularis (70 mg) was considered a suspected unexpected serious adverse drug reaction (SUSAR) related to astegolimab and  was reported two days after the second dose, leading to discontinuation of astegolimab. Two (0.4%) patients reported anaphylaxis and hypersensitivity reactions: 1 severe SAE of  anaphylactic reaction (placebo), and 1 moderate hypersensitivity (490 mg) considered related to astegolimab. Three (0.6%) patients experienced a potential Major Adverse Cardiac Event (MACE) (1 patient each in the placebo, 70 mg, and 210 mg groups). None of the potential MACE were considered related to astegolimab. Overall, 233 (46.4%) patients reported events of infection. A comparable number of patients reported infection across all treatment groups (65 [51.2%] patients on placebo, 55 [43.3%] patients on 70 mg, 58 [46.0%] patients on 210 mg, and 55 [45.1%] patients on 490 mg). The most frequently reported infection (?10% incidence) was nasopharyngitis (12.7%). One patient on astegolimab 210 mg and the partner of one patient on placebo became pregnant during the study. Both delivered normal/healthy babies. Two deaths, unrelated to study drug, were reported: one patient on 210 mg astegolimab died following an SAE of asthma; the other patient on 490 mg astegolimab had an unexplained death. There were no clinically meaningful changes in laboratory parameters, vital signs, or ECG results, other than the 10% decrease in mean blood eosinophil counts in the astegolimab-treated groups, with no safety concerns. Treatment-induced ADAs were comparable between the astegolimab groups and had no impact on safety. Overall, astegolimab was well tolerated at all doses used and had a safety profile consistent with that observed in the previous astegolimab Phase I studies.  Serious Adverse Reactions Observed in Previous COPD Study: In the completed IIS Study [id number], a total of 81 patients received at least one dose of astegolimab or placebo. The safety profile of astegolimab was similar to that of placebo. There were a total of 222 AEs reported in 62 patients. A total of 28 (72%) patients in the placebo arm and 34 (81%) patients in the astegolimab arm reported at least one AE. The most commonly reported AEs were headache followed  by urinary tract infection and viral upper respiratory tract infection. A total of 39 SAEs were reported in 28 (35%) patients; 16 (41%) patients in the placebo group and 12 (29%) in the astegolimab group. The most commonly reported SAE was hospital admission for community acquired pneumonia. Four SAEs resulted in patient discontinuation from treatment (1 patient in the placebo group and 3 patients in the astegolimab group). One patient in the placebo group experienced an AESI of potential MACE (heart failure, unrelated to the study treatment). No anaphylaxis or pregnancies were reported. Two deaths, unrelated to study treatment, were reported: 1 patient in the placebo group died after hospital acquired pneumonia, and another patient in the placebo group died after pneumonia and type 2 respiratory failure.  Safety Data: Astegolimab has been generally well tolerated. There have been 29 patient deaths across all astegolimab studies (Section 5.5.2), none of which were considered related to astegolimab. A total of 144 subjects experienced a total of 224 SAEs across all astegolimab studies. Of these, an SAE livedo reticularis observed in 1 patient was considered related to astegolimab by the investigator (Section 5.5.3). AEs leading to withdrawal (Section 5.5.4) have generally occurred at a rate similar to what would be expected for clinical trials in the studies' respective indications.  [facility] Coordinator note:   This visit for Subject Maria [name] with DOB: 1956-05-16 on 08/10/2022 for the above protocol is Visit/Encounter # Visit 1 Screening  and is for purpose of research.   The consent for this encounter is under Protocol  Version Protocol: Version 3, dated 26/May2023, Version 4 dated 20Jun2023 IB: version 8 dated April 2023 ICF: Main version 12Apr2023, revised [phone] [facility] [phone], revised 01Sep2023 is currently IRB approved.    Subject expressed continued  interest and consent in continuing as a study subject. Subject confirmed that there was no change in contact information (e.g. address, telephone, email). Subject thanked for participation in research and contribution to science. In this visit 08/10/2022 the subject will be evaluated by  Principal Investigator named Dr. Brand Males. This research coordinator has verified that the above investigator is up to date with his/her training logs. This visit is a key visit of  screening. The PI is  available for this visit.     Subject was consented to participate in the above mentioned study prior to any study procedures being completed. All procedures were completed according to the above mentioned protocol. Refer to the subjects paper source binder for further details of the visit.   Signed by Gogebic Bing, CMA, BS, [place] Coordinator [place], [place] 11:38 AM 08/11/2022

## 2022-08-17 ENCOUNTER — Other Ambulatory Visit: Payer: Self-pay | Admitting: Adult Health

## 2022-08-17 ENCOUNTER — Other Ambulatory Visit: Payer: Medicare HMO

## 2022-08-24 DIAGNOSIS — J449 Chronic obstructive pulmonary disease, unspecified: Secondary | ICD-10-CM | POA: Diagnosis not present

## 2022-08-24 DIAGNOSIS — J9611 Chronic respiratory failure with hypoxia: Secondary | ICD-10-CM | POA: Diagnosis not present

## 2022-08-28 ENCOUNTER — Other Ambulatory Visit: Payer: Self-pay | Admitting: Internal Medicine

## 2022-08-28 NOTE — Patient Instructions (Signed)
  Chronic respiratory failure with hypoxia (HCC) Stage 2 moderate COPD by GOLD classification (HCC) Alpha-1-antitrypsin deficiency carrier COPD, frequent exacerbations (HCC)   -cOpd stable  - blood eos normal and do not qualify for Dupixent  - currently in screening phase for ARNASA stdy in copd frequent flare ups  - still with lot of cough despite many medicines for the cough   PLAN - start 3ml of 3% hypertonic saline nebulizer twice daily   - obtained via a custom pharmacy - Cotninue over-the-counter and acetylcysteine and take 600 mg twice daily - Continue on BREZTRI 2 puffs Twice daily  , rinse after use.  - Continue Roflumilast daily - cotninue mucinex as needed - continue tessalon perles as needed - Continue Albuterol inhaler as needed.  - Continue oxygen 3 L at rest and 5 L with exertion - ensure you get RSV vaccine - actually you go ti and we wil update it    Follow-up - Return in 6 months ; 15 min slot - can cancel if on research protocol

## 2022-08-28 NOTE — Progress Notes (Unsigned)
Subjective:  Patient ID: Maria [name], female , DOB: 1956/01/02 , age 67 y.o. , MRN: [id number] , ADDRESS: [place] [address]   03/19/2019 -   Chief Complaint  Patient presents with   Hospitalization Follow-up    Post covid symptoms   Post COVID  follow-up in the pulmonary office  HPI Maria [name] 67 y.o. 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  And also poor intake.  Was diagnosed to have acute COVID-19.  She remembers being on oxygen.  At the time prior to admission she is had chronic hip pain based on her history and review of the records for which she was using a cane.  She spent a total of 5 days at [facility] the [place] [facility] and was discharged on March 03, 2019.  She was treated with oxygen, Solu-Medrol and REM does Advair.  Last set of laboratories reviewed on March 03, 2019 which showed that she had normal hemoglobin and creatinine and his CRP had improved.  The only chest x-ray that was available is 1 on admission in February 26, 2019 but she has some basal infiltrates.  She now presents to the pulmonary clinic on March 19, 2019.  It is 21 days since admission and she is over 21 days since her first set of symptoms.  She tells me that she is a lot more fatigue.  She is so fatigued that she is not even able to take her inhaler properly which is Trelegy for her COPD.  She feels short of breath all the time.  She is requiring a lot of help taking a shower.  Her hip pain continues.  She is now needing the use of a walker.  Her ECOG is 4.  Her appetite is good.  She is currently not using oxygen either at night or in the daytime.   Results for Maria [name], Maria [name]" (MRN [id number]) as of  08/22/2019 12:57  Ref. Range 03/01/2019 03:22 03/02/2019 01:55 03/03/2019 01:54  D-Dimer, America Brown Latest Ref Range: 0.00 - 0.50 ug/mL-FEU 1.34 (H) 1.12 (H) 1.28 (H)       04/03/2019 Presents today for a 67 2-week follow-up.  Echocardiogram showed normal systolic function with estimated EF 60 to 65%.  Left ventricle diastolic doppler consistent with impaired relaxation-determinant filling pressures.  Right ventricle with normal systolic function, increased wall thickness-systolic pressure could not be assessed.  HRCT is scheduled for 9/29.  Patient called on 9/4 asking about portable oxygen tank order.  Reports that her O2 level has read 84 to 85% after exertion with a heart rate of 110.  She reports that her oxygen level recovers greater than 90% after resting.  Is not currently on oxygen at home will need to be walked in office to qualify for POC. Repeat COVID testing was positive, patient reports that she still has lost her taste and smell with increased shortness of breath.   She is feeling a little better today. Family is checking on her. No other help at home. Reports feeling fine when sitting but her O2 drops as low as 85% on exertion. She is able to recover with rest. Taking prednisone taper as prescribed, feels it has helped her chest tightness. She has dry/hacking  cough with no mucus production. She is eating and drinking ok. Denies fever.   04/09/2019 Patient presents today for 1 week follow-up. Unfortunately we were unable to get her set up with home health d/t her insurance. She is here today for qualifying O2 walk. She continues to have some shortness of breath and dry cough. Low energy. Her taste and smell as returned some. She is drinking plenty of fluids. Reports that her cough is not quite as bad, states the more she talks she has to cough. Has trouble sleeping at night d/t her respiratory symptoms. Feels delsym and tessalon perles have been helpful. ONO on RA showed O2 low 70% with baseline 87%.  Ambulatory O2 today showed O2 desaturation 84% on RA, requiring 3L. Using [employer] for nebulizer supplies. Denies fever, weight loss, hemoptysis, purulent mucus.   04/22/2019 Patient presents today for 2 week follow-up. Breathing is the same. Continues to have a dry cough. Completed prednisone 2 days ago. Using incentive spirometry. She has tried to go without her oxygen at home but states that she gets too short winded. 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 Diffuse bronchial wall thickening with moderate centrilobular and paraseptal emphysema; imaging findings suggestive of underlying COPD. She had COPD symptoms before getting COVID. Her breathing was getting a little worse before her diagnosis.  Sleeping better at night with oxygen. Ambulating with rolling walker, still deconditioned/weak. Due for influenza vaccine today.   Oxygen readings:  Resting O2 93% on RA  Ambulatory O2 98-100 on 3L Ambulatory O2 85% on RA  OV 07/03/2019  Subjective:  Patient ID: Maria [name], female , DOB: 1956/02/25 , age 67 y.o. , MRN: [id number] , ADDRESS: [place] [id number]   07/03/2019 -   Chief Complaint  Patient presents with   Follow-up    PFT performed 12/7 and pt is here following up after that. Pt states she is still becoming SOB with exertion.  Follow-up emphysema Follow-up interstitial lung disease post COVID-19 in August 2020 (had elevatd d-dimer) Follow-up chronic hypoxemic respiratory failure secondary to both   HPI Maria [name] 67 y.o. -presents for the above issues.  She says her condition is returned to normal.  She is back working as a Forensic psychologist.  However she continues to be still significantly symptomatic.  She had a pulmonary function test June 24, 2019.  I visualized the result.  Shows isolated reduction in diffusion capacity  was only moderate at 57%.  FVC and FEV1 are normal.  She continues to be significantly symptomatic as documented below.  She is taking Trelegy.  She says she desaturates to 85% with exertion echocardiogram recently was normal.  She is asking if she should take the Moderna COVID-19 vaccine available in January 2021         IMPRESSION: CT chest 04/16/2019 1. 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 Given the presence of some bronchiectasis, this is technically characterized as probable UIP per current ATS guidelines, however, that is not favored. If there is persistent clinical concern for interstitial lung disease, repeat high-resolution chest CT would be recommended in 12 months to assess for temporal changes in the appearance of the lung parenchyma. 2. Diffuse bronchial wall thickening with moderate centrilobular and paraseptal  emphysema; imaging findings suggestive of underlying COPD. 3. Aortic atherosclerosis, in addition to right coronary artery disease. Please note that although the presence of coronary artery calcium documents the presence of coronary artery disease, the severity of this disease and any potential stenosis cannot be assessed on this non-gated CT examination. Assessment for potential risk factor modification, dietary therapy or pharmacologic therapy may be warranted, if clinically indicated.   Aortic Atherosclerosis (ICD10-I70.0) and Emphysema (ICD10-J43.9).     Electronically Signed   By: Vinnie Langton M.D.   On: 04/16/2019 13:12   OV 08/22/2019  Subjective:  Patient ID: Maria [name], female , DOB: 02/05/1956 , age 62 y.o. , MRN: [id number] , ADDRESS: [place] [id number]   08/22/2019 -   Chief Complaint  Patient presents with   Follow-up    History of 2019 Novel coronavirus disease (COVID-19)    Follow-up emphysema - alpha 1 MZ in dec 2020 Follow-up interstitial lung disease post COVID-19 in August 2020 Follow-up chronic hypoxemic respiratory failure secondary to both Ex smoker Echo sept 202 0 - ef 65% and diast dysfn  HPI Maria [name] 67 y.o. -presents for follow-up of the above issues.  In the interim she did have serologic work-up for her ILD that was discovered post Covid setting.  Her ANA and rheumatoid factor were trace positive.  Her ESR was 41.  Therefore we committed her to few to several week prednisone taper.  She says she is on a 5-week taper.  She is got 2 more weeks of this.  She says since her last visit she is feeling worse.  The worsening is in terms of shortness of breath.  Also has dizziness and floaters when she exerts.  In fact on a virtual rehab class she had to get on more oxygen.  Her dyspnea symptom scores are worse than before but cough and fatigue around the same.  She is requiring 3 L now with exertion.  Although in rehab the asked her to increase it to 6 L which helped but this was not documented because of worsening hypoxemia.  Today when we walked her she had to stop several times because of dizziness and fatigue although she did not desaturate.  Her lung function itself in December 2020 shows only isolated reduction in DLCO.   Of note she is alpha-1 MZ from her labs at last visit.    OV 04/29/2020   Subjective:  Patient ID: Maria [name], female , DOB: July 11, 1956, age 28 y.o. years. , MRN: [id number],  ADDRESS: [place] [address] PCP  [name], MD Providers : Treatment Team:  Attending Provider: Brand Males, MD Patient Care Team: [name], MD as PCP - General (Family Medicine)   Follow-up emphysema - alpha 1 MZ in dec 2020 Follow-up interstitial lung disease post COVID-19 in August 2020 -> feb 2021 Case conf ILD: NO ILD Follow-up chronic hypoxemic respiratory failure secondary to both Ex smoker Echo sept  202 0 - ef 65% and diast dysfn Last CT scan of the chest February 2021  Chief Complaint  Patient presents with   Follow-up    Pt states that she has had good days and bad days since last visit. Pt said that she is still having problems with SOB also has an occ cough.       HPI Maria [name] 67 y.o. -returns for follow-up.  Last seen by myself in February 2021.  After that she is  seen nurse practitioner.  Alpha-1 replacement was advised by nurse practitioner.  Initially denied by insurance but now apparently approved by insurance.  The still waiting for some of paperwork to be clear.  Currently on Trelegy.  She is on oxygen 3 L at rest 5 L with exertion.  She is fully vaccinated.  She is in need of Covid booster.  Overall doing well.  No flowsheet data found.        Simple office walk 185 feet x  3 laps goal with forehead probe 08/22/2019   O2 used 3L  Number laps completed Did only 2 of 3. Stopped 8 times and site down x 2. Got dizzy and floaters in eyes  Comments about pace Very slow pace  Resting Pulse Ox/HR 99% and 91/min  Final Pulse Ox/HR 99% during entire walk  Desaturated </= 88% x  Desaturated <= 3% points x  Got Tachycardic >/= 90/min yes  Symptoms at end of test As above  Miscellaneous comments Sub maximal exertion     Las\bs - alph 1 MZ in dec 2020 - autoimmune  - ANA 1:40, RF 23, and ESR 40s   PFT dec 2020  - isolated reduction in dlco 57%   OV 08/27/2020  Subjective:  Patient ID: Maria [name], female , DOB: [birth year] , age 6 y.o. , MRN: [id number] , ADDRESS: [place] [id number] PCP [name], MD Patient Care Team: [name], MD as PCP - General (Family Medicine)  This Provider for this visit: Treatment Team:  Attending Provider: Brand Males, MD    08/27/2020 -   Chief Complaint  Patient presents with   Follow-up    Pt states she still has problems with SOB. States when the weather is colder her breathing  is worse. Pt also has complaints of cough which happens all throughout the day.    Follow-up emphysema - alpha 1 MZ in dec 2020 Follow-up interstitial lung disease post COVID-19 in August 2020 -> feb 2021 Case conf ILD: NO ILD Follow-up chronic hypoxemic respiratory failure secondary to both Ex smoker 40 pack quit Echo sept 202 0 - ef 65% and diast dysfn Last CT scan of the chest February 2021  HPI Maria [name] 67 y.o. -returns to see me for the above issues.  Since last visit she has had a Covid vaccine booster.  Overall she is stable but for the last few months has had worsening cough.  The cough is dry.  She continues with oxygen 3-5 L.  We will try to get her alpha-1 replacement but she got denied by insurance.  She showed me the insurance denial letters on August 10, 2020.  Apparently there was a hearing which she was asked to participate but they sent the note is late and on the same day they denied her appeal.  She feels this is all premeditated by the insurance company.  She is frustrated.  She continues with Trelegy and oxygen.  Review of the records indicate last CT scan of the chest was in February 2021.  She quit smoking less than 15 years ago.  She she is in need of annual CT scan now.  Other than the dry worsening cough she feels well.  There is no worsening wheezing or sputum.   CT Chest data  No results found.       OV 11/13/2020  Subjective:  Patient ID: Maria [name], female , DOB: 10-Oct-1955 , age 51 y.o. ,  MRN: [id number] , ADDRESS: [place] [id number] PCP [name], MD Patient Care Team: [name], MD as PCP - General (Family Medicine)  This Provider for this visit: Treatment Team:  Attending Provider: Brand Males, MD    11/13/2020 -   Chief Complaint  Patient presents with   Follow-up    PFT performed 4/28.  Pt states her cough is worse since last visit and states she still becomes SOB easily.   Follow-up emphysema -  alpha 1 MZ in dec 2020  -- On Trelegy for few years as of 2022.  Dose increased early 2022  -Insurance denied alpha-1 replacement in 2020 07/2020 Follow-up interstitial lung disease post COVID-19 in August 2020 -> feb 2021 Case conf ILD: NO ILD Follow-up chronic hypoxemic respiratory failure secondary to both Ex smoker 40 pack quit Echo sept 202 0 - ef 65% and diast dysfn Last CT scan of the chest February 2021  Chronic coough since jan 2022  HPI Maria [name] 67 y.o. -returns for work-up.  I last saw her in February 2022.  At that time she was reporting worsening cough.  We decided to full work-up.  She had pulmonary function test that shows stability since 2020 although the DLCO is down [?  Technical issues, last hemoglobin 12.1 g% in 2021 May] her oxygen use is the same around 3 L at rest 5 L with exertion.  She uses Trelegy.  She is not on any antihypertensives.  Not on any acid reflux medication.  Not on fish oil.  Her smoking is in remission.  The cough wakes her up at night.  Cough severity is documented below.  It significantly high suggesting irritable larynx syndrome/cough neuropathy.  She had CT scan of the chest that only shows emphysema.  No fibrosis or pneumonia or nodules or cancer.  She had an arterial blood gas and there is no hypercapnia.  She is frustrated by her quality of life with excessive cough.  The cough is driving most of the high COPD CAT score of 35.    Dr Lorenza Cambridge Reflux Symptom Index (> 13-15 suggestive of LPR cough) 0 -> 5  =  none ->severe problem 11/13/2020   Hoarseness of problem with voice 1  Clearing  Of Throat 1  Excess throat mucus or feeling of post nasal drip 2  Difficulty swallowing food, liquid or tablets 0  Cough after eating or lying down 4.5  Breathing difficulties or choking episodes 3.5  Troublesome or annoying cough 4  Sensation of something sticking in throat or lump in throat 3  Heartburn, chest pain, indigestion, or stomach acid coming up  0  TOTAL 19    CAT Score 11/13/2020  Total CAT Score 35         ABG Feb 2022  Results for KIYA, ENO "Maria [name]" (MRN [id number]) as of 11/13/2020 09:07  Ref. Range 08/31/2020 13:35  Sample type Unknown ARTERIAL DRAW  FIO2 Unknown 32.00  pH, Arterial Latest Ref Range: 7.350 - 7.450  7.417  pCO2 arterial Latest Ref Range: 32.0 - 48.0 mmHg 37.3  pO2, Arterial Latest Ref Range: 83.0 - 108.0 mmHg 87.3   CT Chest data feb 2022   IMPRESSION: 1.  No acute process in the chest. 2. Moderate centrilobular and paraseptal emphysema. No other explanation for patient's symptoms. Emphysema (ICD10-J43.9). 3.  Aortic Atherosclerosis (ICD10-I70.0).     Electronically Signed   By: Abigail Miyamoto M.D.   On: 09/04/2020 09:01  12/11/2020 Follow up : COPD with emphysema, oxygen dependent respiratory failure, chronic cough Patient returns for a 1 month follow-up.  Patient has underlying moderate COPD with emphysema.  She also has alpha-1 phenotype MZ with a normal level. She is maintained on oxygen 3 L at rest and 5 L with activity. Patient has been dealing with an ongoing chronic cough since COVID-19 infection in August  2020.  Cough is persistent and feels getting progressively worse. Cough is very frustrating with cough associated urinary incontinence .  High resolution CT chest in 2021 and repeat CT chest February 2022 showed no evidence of interstitial lung disease.  Moderate emphysema. Patient has been recommended on cough control regimen.  Last visit she was started on gabapentin 300 mg twice daily.  She was also changed from Trelegy to [facility] in case the dry powder inhaler was aggravating her upper airway Patient says her cough is no better , unchanged .  Using Halls cough drops.  Has been tried on steroid tapers in the past without any improvement.  Is not using any cough syrups.  Denies any fever, discolored mucus, hemoptysis, chest pain cough is all through the day and night . Sometimes  worse at night     OV 01/19/2021  Subjective:  Patient ID: Maria [name], female , DOB: [birth year] , age 10 y.o. , MRN: [id number] , ADDRESS: [address] [place] [facility] [id number] PCP [name], MD Patient Care Team: [name], MD as PCP - General (Family Medicine)  This Provider for this visit: Treatment Team:  Attending Provider: Brand Males, MD  Follow-up emphysema - alpha 1 MZ in dec 2020  -- On Trelegy for few years as of 2022.  Dose increased early 2022  -Insurance denied alpha-1 replacement in 2020 07/2020 Follow-up interstitial lung disease post COVID-19 in August 2020 -> feb 2021 Case conf ILD: NO ILD Follow-up chronic hypoxemic respiratory failure secondary to both Ex smoker 40 pack quit Echo sept 202 0 - ef 65% and diast dysfn Last CT scan of the chest February 2022  Chronic coough since jan 2022  01/19/2021 -   Chief Complaint  Patient presents with   Follow-up    Pt states she still has complaints of coughing which is about the same since last visit. States she is coughing up clear thick phlegm. States SOB is worse when she goes outside in the humidity but is fine if she stays inside.     HPI Maria [name] 67 y.o. -returns for follow-up of her COPD and severe chronic cough.  She continues to use 3 L of oxygen.  She says without oxygen at rest she starts getting dizzy.  However after return the oxygen off her pulse ox was 92% on room air at rest 10 minutes later.  Overall she feels she is stable but she continues to have significant amount of cough that she rates as moderate.  Wakes her up at night.  There is no associated wheezing.  She does clear her throat and has a laryngeal quality to the cough.  Last visit nurse practitioner added Chlor-Trimeton and Tessalon and Robitussin but despite this cough persist.  She continues on gabapentin 3 mg twice daily.  She has never been to voice rehabilitation.  She is not sure if prednisone has helped her cough  but she knows prednisone makes her irritable but she is willing to give it a short-term empiric trial    CAT Score 12/11/2020 11/13/2020  Total CAT  Score 18 35     IMPRESSION: 1.  No acute process in the chest. 2. Moderate centrilobular and paraseptal emphysema. No other explanation for patient's symptoms. Emphysema (ICD10-J43.9). 3.  Aortic Atherosclerosis (ICD10-I70.0).     Electronically Signed   By: Abigail Miyamoto M.D.   On: 09/04/2020 09:18 Jul 2021   07/28/2021 Follow up : COPD with emphysema, oxygen dependent respiratory failure, chronic cough Patient returns for 60-monthfollow-up.  Patient remains on Breztri inhaler twice daily.  She continues to have ongoing chronic cough despite aggressive cough control regimen.  She is on gabapentin 300 mg 3 times daily, Robitussin DM every 4 hours, Tessalon Perles and Chlor-Trimeton.  Along with reflux diet and PPI. Ran out of Duoneb 2 weeks ago. Typically uses Twice daily . Notices difference in breathing without it.  She remains on oxygen 3 L at rest and 5 L with activity.Uses POC when out from home. Really helps.  Overall feels she is doing some better. Still has cough but not quite is bad. Does have some tightness in chest and back on right side at times. No chest pain, palpitations,  Flu shot is utd. PVX is utd.   Lives at home with husband. Drives. Brother has COPD on O2 . Husband works fulltime Adult kOptician, dispensing 6 grandkids.  Stays active, light housework, shopping.   Patient's insurance will not cover Protonix 20 mg twice daily.  Will only cover Protonix 40 mg.  We discussed changing her prescription over She denies any increased reflux symptoms  OV 10/20/2021  Subjective:  Patient ID: CPaulo [name] female , DOB: 8November 08, 1957, age 67y.o. , MRN: [id number], [place] [id number] CLesleigh Noe MD Patient Care Team: CLesleigh Noe MD as PCP - General (Family Medicine)  This Provider for this  visit: Treatment Team:  Attending Provider: RBrand Males MD    10/20/2021 -   Chief Complaint  Patient presents with   Follow-up    2 mo f/u for COPD. Wants to switch from [employer] to [place] her O2. 3L at rest, 5L w/exertion.    Follow-up emphysema - alpha 1 MZ in dec 2020  -- On Trelegy for few years as of 2022.  Dose increased early 2022  -Insurance denied alpha-1 replacement in 2020 07/2020 Follow-up interstitial lung disease post COVID-19 in August 2020 -> feb 2021 Case conf ILD: NO ILD Follow-up chronic hypoxemic respiratory failure secondary to both Ex smoker 40 pack quit Echo sept 202 0 - ef 65% and diast dysfn Last CT scan of the chest February 2022  Chronic coough since jan 2022 -on gabapentin   HPI Maria AHLGREN89y.o. -returns for follow-up.  Her COPD CAT score is actually better than previous 2022 but similar to May 2022.  Nevertheless she is on 3 L oxygen at rest and 5 L with exertion.  She states in the last 6 months has been on prednisone "quite a few times" for COPD exacerbation and antibiotics at least 2 times.  But currently she is feeling stable.  She does have chronic cough for which she is on a bunch of medications.  She wants to qualify for oxygen.  She is having insurance coverage issues.  She is waiting till she turns 89in August 2023 before she can enroll in Medicare.  She is interested in joining the [facility] insurance program with Medicare.  Her last CT scan of the chest was a  year ago.  We discussed the prevention of COPD exacerbations.  We discussed Roflumilast.  She is interested in this.  We also discussed the potential insurance approval with Dupixent.  She is willing to get a CBC with differential checked.  We discussed upcoming potential clinical trials such as the one being sponsored by [place] which might be available in the future.  She is interested in that if she qualifies.  At this point in time she is okay starting  Daliresp.  She was sitting on 3 L oxygen.  I turned this off to get a qualifying walk test done.       07/28/2021   10:24 AM 12/11/2020    9:24 AM 11/13/2020    8:49 AM  CAT Score  Total CAT Score 19 18 35     12/15/2021 Follow up : COPD with emphysema, oxygen dependent respiratory failure Patient presents for a 81-monthfollow-up and preop surgical risk assessment.  Patient has underlying moderate COPD with emphysema.  She is oxygen dependent she says overall she has been doing well with her breathing.  She still gets short of breath with minimal activities.  Has had no flare of her breathing recently.  No recent antibiotics or steroid use.  Patient was seen early last month.  She was set up for an ABG that showed no significant hypercarbia.  She has been set up for CT chest that is pending, routine as she has a heavy smoking history.  Also Daliresp was added into her maintenance regimen.  She remains on Breztri twice daily. Spirometry today in the office shows stable lung function with FEV1 at 66%, ratio 88, FVC 59%.  [place] [facility] [place], Dr. MSabra Heck. Has degenerative cervical spinal stenosis. Has chronic neck pain.  Has hard time dressing due to neck and left arm pain. Becoming hard to use left arm due to weakness.   Lives at home with spouse. Able to drive. Can do very light house chores and cooking . Can only walk short distances and has to rest. Gets winded easily .   Remains on Oxygen 3l/m at rest and 5l/m with activity .  She denies any increased oxygen demands.    Was seen 02/15/22 by PCP with aecopd rx zpak and pred > no better  Dyspnea:  room to room but comfortable at rest / no cp  Cough: yellowish worse than usual x 02/12/22 very congested sounding / assoc nasal congestion  SABA use: nebulizer not helping   baseline using 3 x daily  /  5-6 x daily since onset of flare 02 turned up to 4lpm POC sats low 90s   Rx  prednisone  OV 03/31/2022  Subjective:  Patient ID: CPaulo [name] female , DOB: 805/19/1957, age 67y.o. , MRN: [id number], [address] [id number] CLesleigh Noe MD Patient Care Team: CLesleigh Noe MD as PCP - General (Family Medicine)  This Provider for this visit: Treatment Team:  Attending Provider: RBrand Males MD  03/31/2022 -   Chief Complaint  Patient presents with   Follow-up    Pt states that she is still having problems with coughing. States sometimes when she coughs it is clear but will also have a yellow tint to it. Also has some wheezing associated.     HPI CTYSHAY [id number].o. -[Marlane Mingleentry] she was seen early August for COPD exacerbation.  She is alpha-1  MZ.  She has quit smoking but still she is not feeling better.  She is having coughing occasional sputum and wheezing.  Symptoms seem worse than baseline and consistent with a COPD flareup.  She is frustrated with repeated flareups.  In 2020 when she was able to walk 3 L and this was adequate.  Currently using 3 L at rest and 5 L with exertion although it is unclear if it is based on subjective needs.  At follow-up we will check walking desaturation test on 3 and 4 L.  Her blood eosinophils have been normal in the past.    CT Chest data sept 022  IMPRESSION: 1. No acute cardiopulmonary process. 2. Aortic Atherosclerosis (ICD10-I70.0) and Emphysema (ICD10-J43.9).     Electronically Signed   By: Ronney Asters M.D.   On: 03/27/2022 21:05  No results found.   Latest Reference Range & Units 10/02/08 22:05 11/09/16 22:06 02/27/19 03:45 02/28/19 01:15 03/01/19 03:22 03/02/19 01:55 03/03/19 01:54 03/19/19 09:56 11/18/19 13:26 10/20/21 09:53  Eosinophils Absolute 0.0 - 0.7 K/uL 0.1 0.2 0.0 0.0 0.0 0.0 0.0 0.1 0.2 0.1    OV 07/05/2022  Subjective:  Patient ID: Maria [name], female , DOB: 1955/11/25 , age 85 y.o. , MRN: [id number] , ADDRESS: [address] [place] [facility]  [id number] PCP Michela Pitcher, NP Patient Care Team: Michela Pitcher, NP as PCP - General (Nurse Practitioner)  This Provider for this visit: Treatment Team:  Attending Provider: Brand Males, MD   07/05/2022 -   Chief Complaint  Patient presents with   Follow-up    Pt states she has been having some heaviness in her chest. States she is about the same as last visit.     HPI Maria [name] 67 y.o. -returns for follow-up.  After last visit she wanted to participate in [place] biological injection research protocol to prevent COPD exacerbations.  However she screen failed because of prolonged QTc which we attributed to her recent antidepressant.  We updated her primary care physician about it.  She continues on the same dose of antidepressant.  No arrhythmias and no hospitalizations due to cardiac issues since.  She went on a cruise with her family and return mid November 2023.  Then 1 or 2 days before Thanksgiving 2023 she developed COVID-19.  She believes she was treated with Paxlovid and prednisone.  However the cough still lingers and she feels she is has an ongoing exacerbation.  She is open to the idea of retaking prednisone.  She is frustrated she did not qualify for research protocol.  While she was ill the Wednesday before Thanksgiving she did trip and then fractured her left lower extremity foot.  She has a boot on.  At this point in time for her pregnancy with exacerbation she is continuing on Breztri and also Roflumilast.  She is never taken and acetylcysteine [1 study from [place] showing benefit].  We tested her eosinophils in the past and they were normal so she did not qualify for Dupixent.   Of note: She recently got her living will and advanced care planning directives done.  She has assigned her daughter who I need a is the main healthcare power of attorney.  She also prefers to have a natural death and she has signed advanced directive papers for this.  Regarding her  prolonged QTc: In October 2023 QTc was 475 ms.  We did an EKG today and I personally visualized the trace.  Her QT is 4  and 8 ms.  Her QTc H is 429 ms..  This is equivalent to the QTc [place] of 434 ms for a heart rate of 72 bpm.  Much improved and normal PFT  OV 08/29/2022  Subjective:  Patient ID: Maria [name], female , DOB: Aug 07, 1955 , age 72 y.o. , MRN: [id number] , ADDRESS: [address] [place] [facility] [address] PCP Michela Pitcher, NP Patient Care Team: Michela Pitcher, NP as PCP - General (Nurse Practitioner)  This Provider for this visit: Treatment Team:  Attending Provider: Brand Males, MD    Follow-up emphysema - alpha 1 MZ in dec 2020  -- On Trelegy for few years as of 2022.  Dose increased early 2022  -Insurance denied alpha-1 replacement in 2020 07/2020 Follow-up concern interstitial lung disease post COVID-19 in August 2020 -> feb 2021 Case conf ILD: NO ILD Follow-up chronic hypoxemic respiratory failure secondary to cp[d  -ABG without hypercapnia April 2023 Ex smoker 40 pack quit Echo sept 202 0 - ef 65% and diast dysfn Last CT scan of the chest February 2022 -> sep 2023   - emphysema +. No cancer  Chronic coough since jan 2022 -on gabapentin  08/29/2022 -   Chief Complaint  Patient presents with   Follow-up    Cough  and wheeze persistent.  PFT today.     HPI Maria [name] 67 y.o. -returns for follow-up.  Is because of repeated flareups we got a pulmonary function test but it is actually stable in the last few years.  She is reassured by this.  She continues on her baseline 3 L oxygen, Breztri inhaler, Roflumilast.  She is also started and acetylcysteine over-the-counter.  She continues with Mucinex as needed.  Despite all this her symptoms are still significant especially her chronic cough.  She was last seen in research for a screening study for frequent exacerbations by [place].  Since then no new emergency room visits.  No medication changes no  hospitalizations no urgent care visits no antibiotics no prednisone.  However in the interim she did get rid of boot in her feet.  Her gabapentin dose has been reduced.  She reports her cough is 4 out of 5.  The gabapentin really has not helped.  She is using the gabapentin for shoulder pain though.  She does have some thick mucus.  She is on Mucinex already.  She has never tried 3% saline nebulizer.  She is willing to try this.  Chart review shows that she has never had RSV vaccine.      PFT     Latest Ref Rng & Units 08/29/2022    8:29 AM 11/12/2020   10:43 AM 01/16/2020   10:57 AM 06/24/2019    8:45 AM  PFT Results  FVC-Pre L 2.79  P 2.94  3.17  2.93   FVC-Predicted Pre % 83  P 84  90  83   FVC-Post L  3.18  3.25  3.39   FVC-Predicted Post %  91  92  96   Pre FEV1/FVC % % 57  P 54  55  56   Post FEV1/FCV % %  54  56  59   FEV1-Pre L 1.60  P 1.60  1.75  1.64   FEV1-Predicted Pre % 62  P 59  65  60   FEV1-Post L  1.71  1.83  1.99   DLCO uncorrected ml/min/mmHg 9.85  P 8.06  10.81  12.24   DLCO UNC% %  47  P 37  50  57   DLCO corrected ml/min/mmHg 9.85  P 8.06  11.29    DLCO COR %Predicted % 47  P 37  52    DLVA Predicted % 50  P 57  61  60   TLC L  4.89  5.25  5.81   TLC % Predicted %  91  98  108   RV % Predicted %  88  86  110     P Preliminary result     Latest Reference Range & Units 10/02/08 22:05 11/09/16 22:06 02/27/19 03:45 02/28/19 01:15 03/01/19 03:22 03/02/19 01:55 03/03/19 01:54 03/19/19 09:56 11/18/19 13:26 10/20/21 09:53 03/31/22 10:19 07/05/22 11:43  Eosinophils Absolute 0.0 - 0.7 K/uL 0.1 0.2 0.0 0.0 0.0 0.0 0.0 0.1 0.2 0.1 0.1 0.1     has a past medical history of Atherosclerosis (1/0612014), Blood transfusion without reported diagnosis (2005), Cancer ([place]) (Ovarian 1978), Chest pain, COPD (chronic obstructive pulmonary disease) ([place]), COVID-19 long hauler manifesting chronic dyspnea, Emphysema of lung ([place]) (2019), Oxygen deficiency (03/2019), and Oxygen  dependent.   reports that she quit smoking about 6 years ago. Her smoking use included cigarettes. She has a 45.00 pack-year smoking history. She has been exposed to tobacco smoke. She has never used smokeless tobacco.  Past Surgical History:  Procedure Laterality Date   ABDOMINAL HYSTERECTOMY  1978   CHOLECYSTECTOMY  2010   COLONOSCOPY     COLONOSCOPY     COLONOSCOPY WITH PROPOFOL N/A 11/01/2021   Procedure: COLONOSCOPY WITH PROPOFOL;  Surgeon: Jonathon Bellows, MD;  Location: [facility] ENDOSCOPY;  Service: Gastroenterology;  Laterality: N/A;   ESOPHAGOGASTRODUODENOSCOPY     NECK SURGERY  1999   OVARIAN CYST REMOVAL  1988   SPINE SURGERY  Neck 2002    Allergies  Allergen Reactions   Montelukast     Bad dreams    Immunization History  Administered Date(s) Administered   Fluad Quad(high Dose 65+) 03/30/2022   Influenza, High Dose Seasonal PF 03/30/2022   Influenza, Seasonal, Injecte, Preservative Fre 04/28/2008, 04/09/2010   Influenza,inj,Quad PF,6+ Mos 04/22/2019, 04/25/2020, 03/15/2021   Influenza-Unspecified 06/01/2008, 05/10/2016, 03/18/2017   Moderna Sars-Covid-2 Vaccination 07/30/2019, 08/28/2019, 06/27/2020   Pfizer Covid-19 Vaccine Bivalent Booster 38yr & up 07/28/2021   Pneumococcal Polysaccharide-23 04/15/2015, 04/22/2020   Td 10/01/2020   Tdap 11/17/2009   Zoster Recombinat (Shingrix) 04/16/2020, 06/22/2020    Family History  Problem Relation Age of Onset   Heart failure Mother    Diabetes Mother    Kidney disease Mother    Hypertension Mother    Arthritis Mother    Heart disease Mother    Obesity Mother    COPD Father    Hearing loss Father    Lung cancer Brother    COPD Brother    COPD Brother    Asthma Son    Diabetes Son    Birth defects Son    Breast cancer Maternal Aunt 40     Current Outpatient Medications:    albuterol (VENTOLIN HFA) 108 (90 Base) MCG/ACT inhaler, TAKE 2 PUFFS BY MOUTH EVERY 6 HOURS AS NEEDED FOR WHEEZE OR SHORTNESS OF BREATH,  Disp: 8.5 each, Rfl: 5   ASPIRIN LOW DOSE 81 MG EC tablet, TAKE 1 TABLET BY MOUTH EVERY DAY, Disp: 90 tablet, Rfl: 3   atorvastatin (LIPITOR) 10 MG tablet, TAKE 1 TABLET BY MOUTH EVERY DAY, Disp: 90 tablet, Rfl: 2   Azelastine HCl 137 MCG/SPRAY SOLN, USE 1 SPRAY INTO EACH NOSTRIL  TWICE A DAY, Disp: 30 mL, Rfl: 1   benzonatate (TESSALON) 200 MG capsule, Take 1 capsule by mouth three times daily as needed, Disp: 60 capsule, Rfl: 0   Budeson-Glycopyrrol-Formoterol (BREZTRI AEROSPHERE) 160-9-4.8 MCG/ACT AERO, Inhale 2 puffs into the lungs in the morning and at bedtime., Disp: 10.7 g, Rfl: 11   Cholecalciferol (VITAMIN D-3) 125 MCG (5000 UT) TABS, Take 5,000 Units by mouth daily., Disp: , Rfl:    escitalopram (LEXAPRO) 20 MG tablet, Take 1 tablet (20 mg total) by mouth daily., Disp: 90 tablet, Rfl: 1   gabapentin (NEURONTIN) 600 MG tablet, Take 1 tablet (600 mg total) by mouth every 8 (eight) hours., Disp: 90 tablet, Rfl: 2   ipratropium-albuterol (DUONEB) 0.5-2.5 (3) MG/3ML SOLN, USE 1 AMPULE IN NEBULIZER 4 TIMES A DAY AS NEEDED (DX: J84.112, J44.9), Disp: 360 mL, Rfl: 5   OVER THE COUNTER MEDICATION, Take 600 mg by mouth daily at 6 (six) AM. N-Acetyl-l-Cysteine tablet, Disp: , Rfl:    oxybutynin (DITROPAN-XL) 10 MG 24 hr tablet, Take 1 tablet (10 mg total) by mouth daily., Disp: 30 tablet, Rfl: 11   OXYGEN, Inhale into the lungs. 3 liters at rest and 5 liters with any activity.-, Disp: , Rfl:    pantoprazole (PROTONIX) 40 MG tablet, TAKE 1  BY MOUTH ONCE DAILY, Disp: 30 tablet, Rfl: 0   Potassium 99 MG TABS, Take 99 mg by mouth daily., Disp: , Rfl:    Respiratory Therapy Supplies (FLUTTER) DEVI, Use as directed, Disp: 1 each, Rfl: 0   roflumilast (DALIRESP) 500 MCG TABS tablet, Take 1 tablet (500 mcg total) by mouth daily., Disp: 30 tablet, Rfl: 11   sodium chloride HYPERTONIC 3 % nebulizer solution, Take by nebulization 2 (two) times daily., Disp: 750 mL, Rfl: 12      Objective:   Vitals:    08/29/22 0937  BP: 136/70  Pulse: 63  Temp: 97.9 F (36.6 C)  TempSrc: Oral  SpO2: 100%  Weight: 141 lb 9.6 oz (64.2 kg)  Height: 5' 5.5" (1.664 m)    Estimated body mass index is 23.21 kg/m as calculated from the following:   Height as of this encounter: 5' 5.5" (1.664 m).   Weight as of this encounter: 141 lb 9.6 oz (64.2 kg).  '@WEIGHTCHANGE'$ @  [employer]   08/29/22 0937  Weight: 141 lb 9.6 oz (64.2 kg)     Physical Exam    General: No distress. Looks well. BArrell chest. O2 on Neuro: Alert and Oriented x 3. GCS 15. Speech normal Psych: Pleasant Resp:  Barrel Chest - yes.  Wheeze - no, Crackles - no, No overt respiratory distress CVS: Normal heart sounds. Murmurs - no Ext: Stigmata of Connective Tissue Disease - no HEENT: Normal upper airway. PEERL +. No post nasal drip        Assessment:       ICD-10-CM   1. Stage 2 moderate COPD by GOLD classification (HCC)  J44.9     2. COPD, frequent exacerbations ([place])  J44.1     3. Chronic respiratory failure with hypoxia (HCC)  J96.11     4. Alpha-1-antitrypsin deficiency carrier  Z14.8     5. Chronic cough  R05.3     6. Vaccine counseling  Z71.85          Plan:     Patient Instructions   Chronic respiratory failure with hypoxia (HCC) Stage 2 moderate COPD by GOLD classification ([place]) Alpha-1-antitrypsin deficiency carrier COPD, frequent exacerbations ([place])   -  cOpd stable  - blood eos normal and do not qualify for Dupixent  - currently in screening phase for ARNASA stdy in copd frequent flare ups  - still with lot of cough despite many medicines for the cough   PLAN - start 40m of 3% hypertonic saline nebulizer twice daily   - obtained via a custom pharmacy - [place]ue over-the-counter and acetylcysteine and take 600 mg twice daily - Continue on BREZTRI 2 puffs Twice daily  , rinse after use.  - Continue Roflumilast daily - cotninue mucinex as needed - continue tessalon perles as needed -  Continue Albuterol inhaler as needed.  - Continue oxygen 3 L at rest and 5 L with exertion - ensure you get RSV vaccine - actually you go ti and we wil update it    Follow-up - Return in 6 months ; 15 min slot - can cancel if on research protocol    SIGNATURE    Dr. MBrand Males M.D., F.C.C.P,  Pulmonary and Critical Care Medicine Staff Physician, CStanding RockDirector - Interstitial Lung Disease  Program  Pulmonary [place] [address] Pager: [phone] If no answer or between  15:00h - 7:00h: call [phone] Telephone: [phone]  10:01 AM 08/29/2022   Moderate Complexity  The table below is from the 2021 E/M guidelines, first released in 2021, with minor revisions added in 2023. Must meet the requirements for 2 out of 3 dimensions to qualify.    Number and complexity of problems addressed Amount and/or complexity of data reviewed Risk of complications and/or morbidity  One or more chronic illness with mild exacerbation, progression, or side effects of treatment  Two or more stable chronic illnesses  One undiagnosed new problem with uncertain prognosis  One acute illness with systemic symptoms   Acute complicated injury Must meet the requirements for 1 of 3 of the categories)  Category 1: Tests and documents, historian  Any combination of 3 of the following:  Assessment requiring an independent historian  Review of prior external records  Review of results of each unique test  Ordering of each unique test    Category 2: Interpretation of tests  Independent interpretation of a test perfromed by another physician/NPP  Category 3: Discuss management/tests  Discussion of magagement or tests with an external physician/NPP Prescription drug management  Decision regarding minor surgery with identfied patient or procedure risk factors  Decision regarding elective  major surgery without identified patient or procedure risk factors  Diagnosis or treatment significantly limited by social determinants of health

## 2022-08-29 ENCOUNTER — Ambulatory Visit: Payer: Medicare HMO | Admitting: Internal Medicine

## 2022-08-29 ENCOUNTER — Ambulatory Visit (INDEPENDENT_AMBULATORY_CARE_PROVIDER_SITE_OTHER): Payer: Medicare HMO | Admitting: Internal Medicine

## 2022-08-29 ENCOUNTER — Encounter: Payer: Self-pay | Admitting: Internal Medicine

## 2022-08-29 VITALS — BP 136/70 | HR 63 | Temp 97.9°F | Ht 65.5 in | Wt 141.6 lb

## 2022-08-29 DIAGNOSIS — Z148 Genetic carrier of other disease: Secondary | ICD-10-CM

## 2022-08-29 DIAGNOSIS — J449 Chronic obstructive pulmonary disease, unspecified: Secondary | ICD-10-CM

## 2022-08-29 DIAGNOSIS — Z7185 Encounter for immunization safety counseling: Secondary | ICD-10-CM | POA: Diagnosis not present

## 2022-08-29 DIAGNOSIS — R053 Chronic cough: Secondary | ICD-10-CM

## 2022-08-29 DIAGNOSIS — J441 Chronic obstructive pulmonary disease with (acute) exacerbation: Secondary | ICD-10-CM | POA: Diagnosis not present

## 2022-08-29 DIAGNOSIS — J9611 Chronic respiratory failure with hypoxia: Secondary | ICD-10-CM

## 2022-08-29 LAB — PULMONARY FUNCTION TEST
DL/VA % pred: 50 %
DL/VA: 2.07 ml/min/mmHg/L
DLCO cor % pred: 47 %
DLCO cor: 9.85 ml/min/mmHg
DLCO unc % pred: 47 %
DLCO unc: 9.85 ml/min/mmHg
FEF 25-75 Pre: 0.7 L/sec
FEF2575-%Pred-Pre: 31 %
FEV1-%Pred-Pre: 62 %
FEV1-Pre: 1.6 L
FEV1FVC-%Pred-Pre: 74 %
FEV6-%Pred-Pre: 86 %
FEV6-Pre: 2.79 L
FEV6FVC-%Pred-Pre: 104 %
FVC-%Pred-Pre: 83 %
FVC-Pre: 2.79 L
Pre FEV1/FVC ratio: 57 %
Pre FEV6/FVC Ratio: 100 %

## 2022-08-29 MED ORDER — SODIUM CHLORIDE 3 % IN NEBU: INHALATION_SOLUTION | Freq: Two times a day (BID) | RESPIRATORY_TRACT | 12 refills | Status: DC

## 2022-08-29 NOTE — Progress Notes (Signed)
Spirometry and DLCO Performed Today.  

## 2022-08-29 NOTE — Patient Instructions (Signed)
Spirometry and DLCO Performed Today.  

## 2022-08-30 NOTE — Telephone Encounter (Signed)
Spoke to patient and she has been scheduled for PTNS treatments.

## 2022-09-01 ENCOUNTER — Encounter: Payer: Self-pay | Admitting: Nurse Practitioner

## 2022-09-01 ENCOUNTER — Ambulatory Visit: Payer: Medicare HMO | Admitting: Family

## 2022-09-01 ENCOUNTER — Ambulatory Visit (INDEPENDENT_AMBULATORY_CARE_PROVIDER_SITE_OTHER): Payer: Medicare HMO | Admitting: Nurse Practitioner

## 2022-09-01 VITALS — BP 112/72 | HR 76 | Temp 97.2°F | Ht 65.5 in | Wt 143.4 lb

## 2022-09-01 DIAGNOSIS — E8801 Alpha-1-antitrypsin deficiency: Secondary | ICD-10-CM | POA: Diagnosis not present

## 2022-09-01 DIAGNOSIS — W19XXXA Unspecified fall, initial encounter: Secondary | ICD-10-CM | POA: Insufficient documentation

## 2022-09-01 DIAGNOSIS — G479 Sleep disorder, unspecified: Secondary | ICD-10-CM | POA: Diagnosis not present

## 2022-09-01 DIAGNOSIS — J438 Other emphysema: Secondary | ICD-10-CM | POA: Diagnosis not present

## 2022-09-01 DIAGNOSIS — W19XXXD Unspecified fall, subsequent encounter: Secondary | ICD-10-CM

## 2022-09-01 LAB — CBC
HCT: 37.7 % (ref 36.0–46.0)
Hemoglobin: 12.4 g/dL (ref 12.0–15.0)
MCHC: 32.9 g/dL (ref 30.0–36.0)
MCV: 94.7 fl (ref 78.0–100.0)
Platelets: 328 10*3/uL (ref 150.0–400.0)
RBC: 3.98 Mil/uL (ref 3.87–5.11)
RDW: 14.1 % (ref 11.5–15.5)
WBC: 10.8 10*3/uL — ABNORMAL HIGH (ref 4.0–10.5)

## 2022-09-01 LAB — BASIC METABOLIC PANEL
BUN: 13 mg/dL (ref 6–23)
CO2: 29 mEq/L (ref 19–32)
Calcium: 9.6 mg/dL (ref 8.4–10.5)
Chloride: 103 mEq/L (ref 96–112)
Creatinine, Ser: 0.65 mg/dL (ref 0.40–1.20)
GFR: 92.32 mL/min (ref >60.00)
Glucose, Bld: 85 mg/dL (ref 70–99)
Potassium: 4 mEq/L (ref 3.5–5.1)
Sodium: 139 mEq/L (ref 135–145)

## 2022-09-01 LAB — TSH: TSH: 1.51 u[IU]/mL (ref 0.35–5.50)

## 2022-09-01 MED ORDER — HYDROXYZINE HCL 10 MG PO TABS: 10.0000 mg | ORAL_TABLET | Freq: Every evening | ORAL | 0 refills | Status: DC | PRN

## 2022-09-01 NOTE — Assessment & Plan Note (Signed)
Patient is followed by [name] pulmonologist.  She is on chronic supplemental oxygen.  Continue taking medications as prescribed and follow-up with pulmonologist as recommended.  Continue using supplemental O2 as prescribed

## 2022-09-01 NOTE — Assessment & Plan Note (Signed)
Patient states she fell and was evaluated at [facility].  States that she did break her what sounds to be fifth metatarsal that has healed appropriately.  Patient is unsure as to why she fell sounds mechanical in nature.  Patient did not hit her head or lose consciousness per her report

## 2022-09-01 NOTE — Assessment & Plan Note (Signed)
Patient came to sleep and then wake up shortly after about 4 hours.  Do not think this is anxiety related.  Patient does have chronic respiratory failure.  Will start with hydroxyzine 10 mg nightly as needed can consider using melatonin or trazodone if this is ineffective.  Did warn of sedation and dizziness precautions given patient's age and medication use

## 2022-09-01 NOTE — Progress Notes (Signed)
Established Patient Office Visit  Subjective   Patient ID: Maria [name], female    DOB: Aug 22, 1955  Age: 67 y.o. MRN: [id number]  Chief Complaint  Patient presents with   Medical Management of Chronic Issues    Sleep Issues is getting worse    HPI  COPD: followed by Dr. Dillard Essex every 3 months. She is doing albuterol and breztri. She is also on chronic oxygen at 3-5 liters depending on activity  Sleep issues: States that it has been going on for a "while" states that it seems to be getting worse. States shewill go to bed and sleep for 30 mins and then she will beup for 3-4 hours. States that she will have to "make herself" go to sleep. She will turn off all the lights and turn on her sound machine. States that her mind is not racing or running. States that she has not tried anything for sleep in the past. States that she is scared of being depenedt on a medicaoitn   States taht she fell 2 days before thanksgiving and broke her left 5th metatarsal. She did see [facility]. Unsure as why she fell. But dizziness or passing out. She did not hit her head     Review of Systems  Constitutional:  Negative for chills and fever.  Respiratory:  Positive for shortness of breath.   Cardiovascular:  Negative for chest pain.  Psychiatric/Behavioral:  Negative for hallucinations and suicidal ideas. The patient has insomnia.       Objective:     BP 112/72   Pulse 76   Temp (!) 97.2 F (36.2 C) (Oral)   Ht 5' 5.5" (1.664 m)   Wt 143 lb 6.4 oz (65 kg)   SpO2 97%   BMI 23.50 kg/m  BP Readings from Last 3 Encounters:  09/01/22 112/72  08/29/22 136/70  08/09/22 (!) 135/56   Wt Readings from Last 3 Encounters:  09/01/22 143 lb 6.4 oz (65 kg)  08/29/22 141 lb 9.6 oz (64.2 kg)  08/09/22 145 lb (65.8 kg)      Physical Exam Vitals and nursing note reviewed.  Constitutional:      Appearance: Normal appearance.  Cardiovascular:     Rate and Rhythm: Normal rate and regular rhythm.      Heart sounds: Normal heart sounds.  Pulmonary:     Effort: Pulmonary effort is normal.     Comments: Decreased globally  On O2 Neurological:     Mental Status: She is alert.      No results found for any visits on 09/01/22.    The 10-year ASCVD risk score (Arnett DK, et al., 2019) is: 3.7%    Assessment & Plan:   Problem List Items Addressed This Visit       Respiratory   Emphysema due to alpha-1-antitrypsin deficiency [facility])    Patient is followed by [name] pulmonologist.  She is on chronic supplemental oxygen.  Continue taking medications as prescribed and follow-up with pulmonologist as recommended.  Continue using supplemental O2 as prescribed      Relevant Orders   CBC   Basic metabolic panel     Other   Fall    Patient states she fell and was evaluated at [facility].  States that she did break her what sounds to be fifth metatarsal that has healed appropriately.  Patient is unsure as to why she fell sounds mechanical in nature.  Patient did not hit her head or lose consciousness per her  report      Sleep disturbance - Primary    Patient came to sleep and then wake up shortly after about 4 hours.  Do not think this is anxiety related.  Patient does have chronic respiratory failure.  Will start with hydroxyzine 10 mg nightly as needed can consider using melatonin or trazodone if this is ineffective.  Did warn of sedation and dizziness precautions given patient's age and medication use      Relevant Medications   hydrOXYzine (ATARAX) 10 MG tablet   Other Relevant Orders   TSH    Return in about 3 months (around 11/30/2022) for sleep recheck/medicaiton recheck .    Romilda Garret, NP

## 2022-09-01 NOTE — Patient Instructions (Signed)
Nice to see you today I have sent in the medication to help with sleep Follow up with me in 3 months, sooner if you need me

## 2022-09-10 DIAGNOSIS — M25511 Pain in right shoulder: Secondary | ICD-10-CM | POA: Diagnosis not present

## 2022-09-10 DIAGNOSIS — G894 Chronic pain syndrome: Secondary | ICD-10-CM | POA: Diagnosis not present

## 2022-09-10 DIAGNOSIS — Q761 Klippel-Feil syndrome: Secondary | ICD-10-CM | POA: Diagnosis not present

## 2022-09-10 DIAGNOSIS — M5010 Cervical disc disorder with radiculopathy, unspecified cervical region: Secondary | ICD-10-CM | POA: Diagnosis not present

## 2022-09-10 DIAGNOSIS — S92352S Displaced fracture of fifth metatarsal bone, left foot, sequela: Secondary | ICD-10-CM | POA: Diagnosis not present

## 2022-09-11 ENCOUNTER — Other Ambulatory Visit: Payer: Self-pay | Admitting: Adult Health

## 2022-09-12 ENCOUNTER — Ambulatory Visit: Payer: Medicare HMO | Admitting: Physician Assistant

## 2022-09-13 ENCOUNTER — Ambulatory Visit: Payer: Medicare HMO | Admitting: Physician Assistant

## 2022-09-13 DIAGNOSIS — N3946 Mixed incontinence: Secondary | ICD-10-CM | POA: Diagnosis not present

## 2022-09-13 NOTE — Patient Instructions (Signed)

## 2022-09-13 NOTE — Progress Notes (Signed)
PTNS  Session # 1  Health & Social Factors: no change Caffeine: 5 Alcohol: 0 Daytime voids #per day: 2-3 Night-time voids #per night: 1-2 Urgency: strong Incontinence Episodes #per day: many (SUI + UUI) Ankle used: right Treatment Setting: 5 Feeling/ Response: sensory Comments: Patient tolerated well. She reports a recent left ankle fracture; will plan for right-sided treatments only. Consent signed today.  Performed By: Debroah Loop, PA-C   Follow Up: 1 week for PTNS #2

## 2022-09-14 ENCOUNTER — Encounter: Payer: Medicare HMO | Admitting: Internal Medicine

## 2022-09-15 ENCOUNTER — Encounter (INDEPENDENT_AMBULATORY_CARE_PROVIDER_SITE_OTHER): Payer: Medicare HMO | Admitting: Internal Medicine

## 2022-09-15 ENCOUNTER — Encounter: Payer: Medicare HMO | Admitting: *Deleted

## 2022-09-15 DIAGNOSIS — J441 Chronic obstructive pulmonary disease with (acute) exacerbation: Secondary | ICD-10-CM

## 2022-09-15 DIAGNOSIS — Z006 Encounter for examination for normal comparison and control in clinical research program: Secondary | ICD-10-CM

## 2022-09-16 NOTE — Progress Notes (Signed)
Title: A Phase III, randomized, double-blind, placebo controlled, multicenter study to evaluate the efficacy and safety of astegolimab in patients with chronic obstructive pulmonary disease    Dose and Duration of Treatment: Astegolimab is presented as a sterile, slightly brown-yellow solution. Each single-use, 2.25 mL pre-filled syringe contains 1.7 mL deliverable volume. Astegolimab drug product is formulated at 140 mg/mL astegolimab with 114 mM succinic acid, 200 mM L-arginine, 10 mM L-methionine, 0.06% (w/v) polysorbate 20, pH 5.7. Placebo for astegolimab is supplied in an identical pre-filled syringe configuration.   Protocol # [id number]   Sponsor: [employer]- [employer] [place], [place]      Conservator, museum/gallery: Astegolimab [phone])   Mechanism of action: Astegolimab (also known as [id number] or [id number]) is a fully human, IgG2 monoclonal antibody that binds with high affinity to the interleukin (IL)-33 (IL-33) receptor, ST2, thereby blocking the signaling of IL-33, an inflammatory cytokine of the IL-1 family and member of the "alarmin" class of molecules. Astegolimab binds with high affinity to the human and cynomolgus monkey receptor for IL-33, ST2, and blocks IL-33 binding, thus inhibiting association with the IL-1R accessory protein (AcP) co-receptor and formation of an activated receptor complex.      This visit for Subject Maria [name] with DOB: 14-May-1956 on 09/16/2022 for the above protocol is Visit/Encounter # consent  and is for purpose of research . Subject/LAR expressed continued interest and consent in continuing as a study subject. Subject thanked for participation in research and contribution to science.   Being rescreend for study. Signed consent. Has agreed to definitely be compliant. All research procedues done only after cosnent   SIGNATURE    Dr. Brand Males, M.D., F.C.C.P, ACRP-CPI Pulmonary and Critical Care  Medicine Research Investigator, [facility] [place] Staff Physician, [place] Director - Interstitial Lung Disease  Program  Pulmonary [facility] @ [place], [place], [address]   Pager: [phone], If no answer  OR between  19:00-7:00h: page [phone] Telephone (research): [phone]  4:50 PM 09/16/2022   4:50 PM 09/16/2022

## 2022-09-16 NOTE — Patient Instructions (Signed)
ICD-10-CM   1. Research exam  Z00.6     2. COPD, frequent exacerbations ([place])  J44.1      Pre research protocol

## 2022-09-19 ENCOUNTER — Ambulatory Visit: Payer: Medicare HMO | Admitting: Physician Assistant

## 2022-09-19 DIAGNOSIS — N3946 Mixed incontinence: Secondary | ICD-10-CM | POA: Diagnosis not present

## 2022-09-19 NOTE — Progress Notes (Signed)
PTNS  Session # 2  Health & Social Factors: no change  Caffeine: 3-4 Alcohol: 0 Daytime voids #per day: 2-3 Night-time voids #per night: 1 Urgency: none Incontinence Episodes #per day: 2-3 Ankle used: right Treatment Setting: 11 Feeling/ Response: sensory Comments: patient tolerated well.  Performed By: Mariam Dollar, RMA  Follow Up: 1 week

## 2022-09-19 NOTE — Patient Instructions (Signed)

## 2022-09-22 DIAGNOSIS — J9611 Chronic respiratory failure with hypoxia: Secondary | ICD-10-CM | POA: Diagnosis not present

## 2022-09-22 DIAGNOSIS — J449 Chronic obstructive pulmonary disease, unspecified: Secondary | ICD-10-CM | POA: Diagnosis not present

## 2022-09-26 ENCOUNTER — Ambulatory Visit: Payer: Medicare HMO | Admitting: Physician Assistant

## 2022-09-26 DIAGNOSIS — N3946 Mixed incontinence: Secondary | ICD-10-CM

## 2022-09-26 NOTE — Patient Instructions (Signed)
Tracking Your Bladder Symptoms   Patient Name:___________________________________________________  Example: Day   Daytime Voids  Nighttime Voids Urgency for the Day (none, mild, strong, severe) Number of Accidents/ Leaks Beverage Comments  Monday IIII II Strong I Water IIII Coffee  I     Week Starting:____________________________________  Day Daytime  Voids Nighttime  Voids Urgency for the Day (none, mild, strong, severe) Number of Accidents/ Leaks Beverages Comments                                                           This week my symptoms were:  O much better O better O the same O worse   

## 2022-09-26 NOTE — Progress Notes (Addendum)
PTNS  Session # 3  Health & Social Factors: no Change  Caffeine: 2-3 Alcohol: 0 Daytime voids #per day: 2-3 Night-time voids #per night: 1 Urgency: None Incontinence Episodes #per day: 3 Ankle used: Right Treatment Setting: 7 Feeling/ Response: sensory feeling in the foot Comments: Patient tolerated well   Performed By: Laurene Footman , RMA  Follow Up: 1 week

## 2022-09-28 ENCOUNTER — Encounter: Payer: Medicare HMO | Admitting: *Deleted

## 2022-09-28 ENCOUNTER — Encounter (INDEPENDENT_AMBULATORY_CARE_PROVIDER_SITE_OTHER): Payer: Medicare HMO | Admitting: Internal Medicine

## 2022-09-28 DIAGNOSIS — Z006 Encounter for examination for normal comparison and control in clinical research program: Secondary | ICD-10-CM

## 2022-09-28 DIAGNOSIS — J441 Chronic obstructive pulmonary disease with (acute) exacerbation: Secondary | ICD-10-CM

## 2022-09-28 MED ORDER — STUDY - ARNASA GB44332 - ASTEGOLIMAB 238 MG/1.7 ML OR PLACEBO SQ INJECTION (PI-RAMASWAMY)
476.0000 mg | INJECTION | SUBCUTANEOUS | Status: DC
  Administered 2022-09-28: 476 mg via SUBCUTANEOUS
  Filled 2022-09-28: qty 3.4

## 2022-09-28 NOTE — Research (Signed)
Title: A Phase III, randomized, double-blind, placebo controlled, multicenter study to evaluate the efficacy and safety of astegolimab in patients with chronic obstructive pulmonary disease    Dose and Duration of Treatment: Astegolimab is presented as a sterile, slightly brown-yellow solution. Each single-use, 2.25 mL pre-filled syringe contains 1.7 mL deliverable volume. Astegolimab drug product is formulated at 140 mg/mL astegolimab with 114 mM succinic acid, 200 mM L-arginine, 10 mM L-methionine, 0.06% (w/v) polysorbate 20, pH 5.7. Placebo for astegolimab is supplied in an identical pre-filled syringe configuration.   Protocol # [id number]   Sponsor: F. [employer]- [employer] [place], [place]   [facility] Health Scientist, physiological note:   This visit for Subject Maria [name] with DOB: Sep 20, 1955 on 09/28/2022 for the above protocol is Visit/Encounter # Visit 2 Day 1  and is for purpose of research.   The consent for this encounter is under Protocol VersionProtocol: Version 3, dated 26/May2023, Version 4 dated 20Jun2023 IB: version 8 dated April 2023 ICF: Main version 12Apr2023, revised 17July2023 [facility] [phone], revised 01Sep2023 Lab Manual: V4.0.0 20Oct2023 is currently IRB approved.   Subject expressed continued interest and consent in continuing as a study subject. Subject confirmed that there was  no change in contact information (e.g. address, telephone, email). Subject thanked for participation in research and contribution to science. In this visit 09/28/2022 the subject will be evaluated by Principal Investigator named Dr. Brand Males. This research coordinator has verified that the above investigator is up to date with his/her training logs.   The Subject was informed that the PI Dr. Brand Males continues to have oversight of the subject's visits and course through relevant discussions, reviews, and also specifically of this  visit by routing of this note to the PI. This visit is a key visit of randomization. The PI is  available for this visit.    All procedures completed per the above mentioned protocol. Subject randomized and IP was administered. Subject tolerated injections well without complaints. Refer to the subjects paper source binder for further details of the visit.   Signed by Warsaw Bing, CMA, BS, [place] Coordinator  [place], [place] 2:16 PM 09/28/2022

## 2022-09-29 DIAGNOSIS — M25511 Pain in right shoulder: Secondary | ICD-10-CM | POA: Diagnosis not present

## 2022-09-29 DIAGNOSIS — G894 Chronic pain syndrome: Secondary | ICD-10-CM | POA: Diagnosis not present

## 2022-09-29 DIAGNOSIS — Q761 Klippel-Feil syndrome: Secondary | ICD-10-CM | POA: Diagnosis not present

## 2022-09-29 DIAGNOSIS — S92352S Displaced fracture of fifth metatarsal bone, left foot, sequela: Secondary | ICD-10-CM | POA: Diagnosis not present

## 2022-09-29 DIAGNOSIS — M5010 Cervical disc disorder with radiculopathy, unspecified cervical region: Secondary | ICD-10-CM | POA: Diagnosis not present

## 2022-10-03 ENCOUNTER — Ambulatory Visit: Payer: Medicare HMO | Admitting: Physician Assistant

## 2022-10-03 DIAGNOSIS — N3946 Mixed incontinence: Secondary | ICD-10-CM

## 2022-10-03 NOTE — Progress Notes (Addendum)
PTNS  Session # 4  Health & Social Factors: no Change Caffeine: 2-3 Alcohol: 0 Daytime voids #per day: 2 Night-time voids #per night: 1 Urgency: None Incontinence Episodes #per day: 2-4 Ankle used: right Treatment Setting: 7 Feeling/ Response: Sensory in heel of thr foot    Performed By: Mariam Dollar, RMA  Follow Up: 1 wk

## 2022-10-03 NOTE — Patient Instructions (Signed)
Tracking Your Bladder Symptoms   Patient Name:___________________________________________________  Example: Day   Daytime Voids  Nighttime Voids Urgency for the Day (none, mild, strong, severe) Number of Accidents/ Leaks Beverage Comments  Monday IIII II Strong I Water IIII Coffee  I     Week Starting:____________________________________  Day Daytime  Voids Nighttime  Voids Urgency for the Day (none, mild, strong, severe) Number of Accidents/ Leaks Beverages Comments                                                           This week my symptoms were:  O much better O better O the same O worse   

## 2022-10-09 ENCOUNTER — Other Ambulatory Visit: Payer: Self-pay | Admitting: Adult Health

## 2022-10-10 ENCOUNTER — Ambulatory Visit: Payer: Medicare HMO | Admitting: Physician Assistant

## 2022-10-10 ENCOUNTER — Other Ambulatory Visit: Payer: Self-pay | Admitting: Nurse Practitioner

## 2022-10-10 DIAGNOSIS — N3946 Mixed incontinence: Secondary | ICD-10-CM

## 2022-10-10 DIAGNOSIS — G479 Sleep disorder, unspecified: Secondary | ICD-10-CM

## 2022-10-10 NOTE — Patient Instructions (Signed)
Tracking Your Bladder Symptoms   Patient Name:___________________________________________________  Example: Day   Daytime Voids  Nighttime Voids Urgency for the Day (none, mild, strong, severe) Number of Accidents/ Leaks Beverage Comments  Monday IIII II Strong I Water IIII Coffee  I     Week Starting:____________________________________  Day Daytime  Voids Nighttime  Voids Urgency for the Day (none, mild, strong, severe) Number of Accidents/ Leaks Beverages Comments                                                           This week my symptoms were:  O much better O better O the same O worse   

## 2022-10-10 NOTE — Progress Notes (Signed)
PTNS   Session # 5   Health & Social Factors: no Change Caffeine: 2-3 Alcohol: 0 Daytime voids #per day: 2 Night-time voids #per night: 1 Urgency: None Incontinence Episodes #per day: 2-4 Ankle used: right Treatment Setting: 7 Feeling/ Response: Sensory in heel of thr foot      Performed By: Gaspar Cola CMA    Follow Up: 1 wk

## 2022-10-11 ENCOUNTER — Other Ambulatory Visit: Payer: Self-pay | Admitting: Internal Medicine

## 2022-10-11 NOTE — Telephone Encounter (Signed)
Called pt and she states if she takes it every night she sleep every night, but if she don't take it she don't sleep.

## 2022-10-12 ENCOUNTER — Encounter: Payer: Medicare HMO | Admitting: Internal Medicine

## 2022-10-12 DIAGNOSIS — J441 Chronic obstructive pulmonary disease with (acute) exacerbation: Secondary | ICD-10-CM

## 2022-10-12 DIAGNOSIS — Z006 Encounter for examination for normal comparison and control in clinical research program: Secondary | ICD-10-CM

## 2022-10-12 MED ORDER — STUDY - ARNASA GB44332 - ASTEGOLIMAB 238 MG/1.7 ML OR PLACEBO SQ INJECTION (PI-RAMASWAMY)
476.0000 mg | INJECTION | SUBCUTANEOUS | Status: DC
  Filled 2022-10-12: qty 3.4

## 2022-10-12 NOTE — Research (Signed)
Title: A Phase III, randomized, double-blind, placebo controlled, multicenter study to evaluate the efficacy and safety of astegolimab in patients with chronic obstructive pulmonary disease    Dose and Duration of Treatment: Astegolimab is presented as a sterile, slightly brown-yellow solution. Each single-use, 2.25 mL pre-filled syringe contains 1.7 mL deliverable volume. Astegolimab drug product is formulated at 140 mg/mL astegolimab with 114 mM succinic acid, 200 mM L-arginine, 10 mM L-methionine, 0.06% (w/v) polysorbate 20, pH 5.7. Placebo for astegolimab is supplied in an identical pre-filled syringe configuration.   Protocol # [id number]   Sponsor: F. [employer] [address] [place]   [facility] Astronomer note:   This visit for Subject Maria [name] with DOB: 20-Feb-1956 on 10/12/2022 for the above protocol is Visit/Encounter # Visit 3 Week 2  and is for purpose of research.   The consent for this encounter is under Protocol: Version 3, dated 26/May2023, Version 4 dated 20Jun2023 IB: version 8 dated April 2023 ICF: Main version 12Apr2023, revised 17July2023 [facility] 01Sep2023, revised 01Sep2023 is currently IRB approved.   Subject expressed continued interest and consent in continuing as a study subject. Subject confirmed that there was no change in contact information (e.g. address, telephone, email). Subject thanked for participation in research and contribution to science.  The Subject was informed that the PI continues to have oversight of the subject's visits and course through relevant discussions, reviews, and also specifically of this visit by routing of this note to the PI.  All procedures completed per the above mentioned protocol. Refer to the subjects paper source binder for further details of the visit.    Signed by Jerolyn Shin, CMA Clinical Research Coordinator [facility]  [place], [place] 10/12/2022

## 2022-10-16 ENCOUNTER — Other Ambulatory Visit: Payer: Self-pay | Admitting: Student in an Organized Health Care Education/Training Program

## 2022-10-17 ENCOUNTER — Ambulatory Visit: Payer: Medicare HMO | Admitting: Physician Assistant

## 2022-10-17 DIAGNOSIS — N3946 Mixed incontinence: Secondary | ICD-10-CM | POA: Diagnosis not present

## 2022-10-17 NOTE — Patient Instructions (Signed)
Tracking Your Bladder Symptoms   Patient Name:___________________________________________________  Example: Day   Daytime Voids  Nighttime Voids Urgency for the Day (none, mild, strong, severe) Number of Accidents/ Leaks Beverage Comments  Monday IIII II Strong I Water IIII Coffee  I     Week Starting:____________________________________  Day Daytime  Voids Nighttime  Voids Urgency for the Day (none, mild, strong, severe) Number of Accidents/ Leaks Beverages Comments                                                           This week my symptoms were:  O much better O better O the same O worse   

## 2022-10-17 NOTE — Progress Notes (Signed)
PTNS  Session # 6  Health & Social Factors: NONE Caffeine: 2 CUPS  Alcohol: 0 Daytime voids #per day: 3 Night-time voids #per night: 0 Urgency: none Incontinence Episodes #per day: none Ankle used: right Treatment Setting: 9 Feeling/ Response:  Comments: feeling on bottom of her foot, 2nd toe did wiggle  Performed By: Lanice Schwab CMA  Follow Up: 1 week

## 2022-10-23 DIAGNOSIS — J9611 Chronic respiratory failure with hypoxia: Secondary | ICD-10-CM | POA: Diagnosis not present

## 2022-10-23 DIAGNOSIS — J449 Chronic obstructive pulmonary disease, unspecified: Secondary | ICD-10-CM | POA: Diagnosis not present

## 2022-10-24 ENCOUNTER — Ambulatory Visit: Payer: Medicare HMO | Admitting: Physician Assistant

## 2022-10-24 DIAGNOSIS — N3946 Mixed incontinence: Secondary | ICD-10-CM

## 2022-10-24 NOTE — Progress Notes (Signed)
PTNS  Session # 7  Health & Social Factors: No Changes Caffeine: 2 Alcohol: 0 Daytime voids #per day: 3 Night-time voids #per night: 0 Urgency: none Incontinence Episodes #per day: 3-4 Ankle used: left Treatment Setting: 12 Feeling/ Response: sensory feeling at foot   Performed By: Randa Lynn

## 2022-10-26 ENCOUNTER — Encounter: Payer: Medicare HMO | Admitting: *Deleted

## 2022-10-26 DIAGNOSIS — Z006 Encounter for examination for normal comparison and control in clinical research program: Secondary | ICD-10-CM

## 2022-10-26 DIAGNOSIS — J441 Chronic obstructive pulmonary disease with (acute) exacerbation: Secondary | ICD-10-CM

## 2022-10-26 MED ORDER — STUDY - ARNASA GB44332 - ASTEGOLIMAB 238 MG/1.7 ML OR PLACEBO SQ INJECTION (PI-RAMASWAMY)
476.0000 mg | INJECTION | SUBCUTANEOUS | Status: DC
  Administered 2022-10-26: 476 mg via SUBCUTANEOUS
  Filled 2022-10-26: qty 3.4

## 2022-10-26 NOTE — Research (Signed)
Title: A Phase III, randomized, double-blind, placebo controlled, multicenter study to evaluate the efficacy and safety of astegolimab in patients with chronic obstructive pulmonary disease    Dose and Duration of Treatment: Astegolimab is presented as a sterile, slightly brown-yellow solution. Each single-use, 2.25 mL pre-filled syringe contains 1.7 mL deliverable volume. Astegolimab drug product is formulated at 140 mg/mL astegolimab with 114 mM succinic acid, 200 mM L-arginine, 10 mM L-methionine, 0.06% (w/v) polysorbate 20, pH 5.7. Placebo for astegolimab is supplied in an identical pre-filled syringe configuration.   Protocol # [id number]   Sponsor: F[employer] [address], [place]  [facility] Astronomer note:   This visit for Subject Maria [name] with DOB: Mar 09, 1956 on 10/26/2022 for the above protocol is Visit/Encounter # 4/Week 4  and is for purpose of research.   The consent for this encounter is under Protocol Version Protocol: Version 3, dated 26/May2023, Version 4 dated 20Jun2023 IB: version 8 dated April 2023 ICF: Main version 12Apr2023, revised 17July2023 [facility] 01Sep2023, revised 01Sep2023 is currently IRB approved.   Subject expressed continued interest and consent in continuing as a study subject. Subject confirmed that there was  no change in contact information (e.g. address, telephone, email). Subject thanked for participation in research and contribution to science. The Subject was informed that the PI Dr Marchelle Gearing continues to have oversight of the subject's visits and course through relevant discussions, reviews, and also specifically of this visit by routing of this note to the PI.  All procedures completed per the above mentioned protocol. Subject tolerated injections without complaints. Refer to the subjects paper source binder for further details of the visits.      Signed by Carron Curie, CMA, BS, [facility] Clinical Research Coordinator [place], [place] 09:81 PM 10/26/2022

## 2022-10-30 DIAGNOSIS — M25511 Pain in right shoulder: Secondary | ICD-10-CM | POA: Diagnosis not present

## 2022-10-30 DIAGNOSIS — G894 Chronic pain syndrome: Secondary | ICD-10-CM | POA: Diagnosis not present

## 2022-10-30 DIAGNOSIS — Q761 Klippel-Feil syndrome: Secondary | ICD-10-CM | POA: Diagnosis not present

## 2022-10-30 DIAGNOSIS — S92352S Displaced fracture of fifth metatarsal bone, left foot, sequela: Secondary | ICD-10-CM | POA: Diagnosis not present

## 2022-10-30 DIAGNOSIS — M5010 Cervical disc disorder with radiculopathy, unspecified cervical region: Secondary | ICD-10-CM | POA: Diagnosis not present

## 2022-10-31 ENCOUNTER — Ambulatory Visit: Payer: Medicare HMO | Admitting: Physician Assistant

## 2022-10-31 DIAGNOSIS — N3946 Mixed incontinence: Secondary | ICD-10-CM

## 2022-10-31 NOTE — Progress Notes (Signed)
PTNS  Session # 8  Health & Social Factors: no change Caffeine: 2 Alcohol: 0 Daytime voids #per day: 2-3 Night-time voids #per night: 0 Urgency: none Incontinence Episodes #per day: 2-3 Ankle used: right Treatment Setting: 6 Feeling/ Response: sensory in the right foot Comments: f/u 1 week  Performed By: Jearld Pies RMA  Follow Up: 1 week

## 2022-10-31 NOTE — Patient Instructions (Signed)
Tracking Your Bladder Symptoms   Patient Name:___________________________________________________  Example: Day   Daytime Voids  Nighttime Voids Urgency for the Day (none, mild, strong, severe) Number of Accidents/ Leaks Beverage Comments  Monday IIII II Strong I Water IIII Coffee  I     Week Starting:____________________________________  Day Daytime  Voids Nighttime  Voids Urgency for the Day (none, mild, strong, severe) Number of Accidents/ Leaks Beverages Comments                                                           This week my symptoms were:  O much better O better O the same O worse   

## 2022-11-03 ENCOUNTER — Encounter: Payer: Self-pay | Admitting: Student in an Organized Health Care Education/Training Program

## 2022-11-03 ENCOUNTER — Ambulatory Visit
Payer: Medicare HMO | Attending: Student in an Organized Health Care Education/Training Program | Admitting: Student in an Organized Health Care Education/Training Program

## 2022-11-03 VITALS — BP 139/49 | HR 65 | Temp 97.3°F | Resp 18 | Ht 65.0 in | Wt 141.0 lb

## 2022-11-03 DIAGNOSIS — G894 Chronic pain syndrome: Secondary | ICD-10-CM

## 2022-11-03 DIAGNOSIS — M25511 Pain in right shoulder: Secondary | ICD-10-CM | POA: Diagnosis not present

## 2022-11-03 DIAGNOSIS — M5010 Cervical disc disorder with radiculopathy, unspecified cervical region: Secondary | ICD-10-CM | POA: Diagnosis not present

## 2022-11-03 DIAGNOSIS — G8929 Other chronic pain: Secondary | ICD-10-CM

## 2022-11-03 DIAGNOSIS — Q761 Klippel-Feil syndrome: Secondary | ICD-10-CM | POA: Insufficient documentation

## 2022-11-03 MED ORDER — GABAPENTIN 600 MG PO TABS: 600.0000 mg | ORAL_TABLET | Freq: Three times a day (TID) | ORAL | 5 refills | Status: DC

## 2022-11-03 NOTE — Patient Instructions (Signed)
Procedure instructions  Do not eat or drink fluids (other than water) for 6 hours before your procedure  No water for 2 hours before your procedure  Take your blood pressure medicine with a sip of water  Arrive 30 minutes before your appointment  Carefully read the "Preparing for your procedure" detailed instructions  If you have questions call us at [phone]  _____________________________________________________________________    ______________________________________________________________________  Preparing for your procedure  Appointments: If you think you may not be able to keep your appointment, call 24-48 hours in advance to cancel. We need time to make it available to others.  During your procedure appointment there will be: No Prescription Refills. No disability issues to discussed. No medication changes or discussions.  Instructions: Food intake: Avoid eating anything solid for at least 8 hours prior to your procedure. Clear liquid intake: You may take clear liquids such as water up to 2 hours prior to your procedure. (No carbonated drinks. No soda.) Transportation: Unless otherwise stated by your physician, bring a driver. Morning Medicines: Except for blood thinners, take all of your other morning medications with a sip of water. Make sure to take your heart and blood pressure medicines. If your blood pressure's lower number is above 100, the case will be rescheduled. Blood thinners: Make sure to stop your blood thinners as instructed.  If you take a blood thinner, but were not instructed to stop it, call our office [phone] and ask to talk to a nurse. Not stopping a blood thinner prior to certain procedures could lead to serious complications. Diabetics on insulin: Notify the staff so that you can be scheduled 1st case in the morning. If your diabetes requires high dose insulin, take only  of your normal insulin dose the morning of the procedure and  notify the staff that you have done so. Preventing infections: Shower with an antibacterial soap the morning of your procedure.  Build-up your immune system: Take 1000 mg of Vitamin C with every meal (3 times a day) the day prior to your procedure. Antibiotics: Inform the nursing staff if you are taking any antibiotics or if you have any conditions that may require antibiotics prior to procedures. (Example: recent joint implants)   Pregnancy: If you are pregnant make sure to notify the nursing staff. Not doing so may result in injury to the fetus, including death.  Sickness: If you have a cold, fever, or any active infections, call and cancel or reschedule your procedure. Receiving steroids while having an infection may result in complications. Arrival: You must be in the facility at least 30 minutes prior to your scheduled procedure. Tardiness: Your scheduled time is also the cutoff time. If you do not arrive at least 15 minutes prior to your procedure, you will be rescheduled.  Children: Do not bring any children with you. Make arrangements to keep them home. Dress appropriately: There is always a possibility that your clothing may get soiled. Avoid long dresses. Valuables: Do not bring any jewelry or valuables.  Reasons to call and reschedule or cancel your procedure: (Following these recommendations will minimize the risk of a serious complication.) Surgeries: Avoid having procedures within 2 weeks of any surgery. (Avoid for 2 weeks before or after any surgery). Flu Shots: Avoid having procedures within 2 weeks of a flu shots or . (Avoid for 2 weeks before or after immunizations). Barium: Avoid having a procedure within 7-10 days after having had a radiological study involving the use of radiological contrast. (  Myelograms, Barium swallow or enema study). Heart attacks: Avoid any elective procedures or surgeries for the initial 6 months after a "Myocardial Infarction" (Heart Attack). Blood  thinners: It is imperative that you stop these medications before procedures. Let us know if you if you take any blood thinner.  Infection: Avoid procedures during or within two weeks of an infection (including chest colds or gastrointestinal problems). Symptoms associated with infections include: Localized redness, fever, chills, night sweats or profuse sweating, burning sensation when voiding, cough, congestion, stuffiness, runny nose, sore throat, diarrhea, nausea, vomiting, cold or Flu symptoms, recent or current infections. It is specially important if the infection is over the area that we intend to treat. Heart and lung problems: Symptoms that may suggest an active cardiopulmonary problem include: cough, chest pain, breathing difficulties or shortness of breath, dizziness, ankle swelling, uncontrolled high or unusually low blood pressure, and/or palpitations. If you are experiencing any of these symptoms, cancel your procedure and contact your primary care physician for an evaluation.  Remember:  Regular Business hours are:  Monday to Thursday 8:00 AM to 4:00 PM  Provider's Schedule: Delano Metz, MD:  Procedure days: Tuesday and Thursday 7:30 AM to 4:00 PM  Edward Jolly, MD:  Procedure days: Monday and Wednesday 7:30 AM to 4:00 PM

## 2022-11-03 NOTE — Progress Notes (Signed)
Safety precautions to be maintained throughout the outpatient stay will include: orient to surroundings, keep bed in low position, maintain call bell within reach at all times, provide assistance with transfer out of bed and ambulation.  

## 2022-11-03 NOTE — Progress Notes (Signed)
PROVIDER NOTE: Information contained herein reflects review and annotations entered in association with encounter. Interpretation of such information and data should be left to medically-trained personnel. Information provided to patient can be located elsewhere in the medical record under "Patient Instructions". Document created using STT-dictation technology, any transcriptional errors that may result from process are unintentional.    Patient: Maria [name]  Service Category: E/M  Provider: Edward Jolly, MD  DOB: 01/03/1956  DOS: 11/03/2022  Referring Provider: Eden Emms, NP  MRN: [id number]  Specialty: Interventional Pain Management  PCP: Eden Emms, NP  Type: Established Patient  Setting: Ambulatory outpatient    Location: Office  Delivery: Face-to-face     HPI  Maria [name], a 67 y.o. year old female, is here today because of her Cervical fusion syndrome [Q76.1]. Maria [name] primary complain today is Neck Pain (Right) Last encounter: My last encounter with her was on 08/09/22 Pertinent problems: Maria [name] has Anxiety; Numbness and tingling in both hands; History of cervical spinal surgery; Radicular pain in right arm; Cervical fusion syndrome (C6-7); Cervical disc disorder with radiculopathy of cervical region (right); Requires continuous at home supplemental oxygen (3L)  COPD; and Chronic pain syndrome on their pertinent problem list. Pain Assessment: Severity of Chronic pain is reported as a 6 /10. Location: Neck Lateral, Right/Radaites from left neck into left arm down to left elbow. Onset: More than a month ago. Quality: Contraction, Aching, Burning. Timing: Constant. Modifying factor(s): Tens and laying with pillow. Vitals:  height is 5\' 5"  (1.651 m) and weight is 141 lb (64 kg). Her temporal temperature is 97.3 F (36.3 C) (abnormal). Her blood pressure is 139/49 (abnormal) and her pulse is 65. Her respiration is 18 and oxygen saturation is 100%.   Reason for encounter:   -Increased right cervical pain with right cervical rotation, increased numbness and tingling in right arm/hand -History of cervical fusion -Difficulty with right arm movement given pain and some weakness. Dropping objects with right hand. -Discussed C-ESI   08/09/22 Since her last visit with me she unfortunately sustained a closed fracture of fifth metatarsal bone of left foot. Boot in place, being followed by [facility]. Experiencing swelling and pain of her left foot. Has f/u with [facility] Thurs- planning for non-op management Is not endorsing any additional benefit with Gabapentin increase from 600 mg TID to 800 mg TID. States that it is causing insomnia. Will reduce dose back to 600 mg TID. She is also inquiring about a TENS unit and I'll have DME rep talk with her about this today as he is in clinic  05/17/22 Patient follows up today endorsing any significant pain relief functional benefit after gabapentin titration to 600 mg 3 times a day.  She is not having effects including vision changes, lower extremity swelling, changes, sedation.  We discussed increasing her dose to 800 mg 3 times a day.  Consider transition to Lyrica.  She is leaving for a cruise this weekend.  She states that her husband is retiring tomorrow and they are very excited about that.  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  She describes severe difficulty in cervical lateral motion and cervical extension.  She is on home oxygen.  She has COPD.  She is on gabapentin 300 mg 3 times a day.  She would like to  avoid any opioid analgesics or sedative medications.  She has had trigger point injections in the past as well as physical therapy.  She has tried to do home physical therapy exercises but they have proved too difficult and have increased her  pain.  MRI findings are below.  She also has a history of depression.  ROS  Constitutional: Denies any fever or chills Gastrointestinal: No reported hemesis, hematochezia, vomiting, or acute GI distress Musculoskeletal:  Right arm pain, left foot pain Neurological: No reported episodes of acute onset apraxia, aphasia, dysarthria, agnosia, amnesia, paralysis, loss of coordination, or loss of consciousness  Medication Review  Azelastine HCl, Budeson-Glycopyrrol-Formoterol, Flutter, OVER THE COUNTER MEDICATION, Oxygen-Helium, Potassium, Vitamin D-3, albuterol, aspirin EC, atorvastatin, benzonatate, escitalopram, gabapentin, hydrOXYzine, ipratropium-albuterol, oxybutynin, pantoprazole, roflumilast, and sodium chloride HYPERTONIC  History Review  Allergy: Maria [name] is allergic to montelukast. Drug: Maria [name]  reports no history of drug use. Alcohol:  reports no history of alcohol use. Tobacco:  reports that she quit smoking about 6 years ago. Her smoking use included cigarettes. She has a 45.00 pack-year smoking history. She has been exposed to tobacco smoke. She has never used smokeless tobacco. Social: Maria [name]  reports that she quit smoking about 6 years ago. Her smoking use included cigarettes. She has a 45.00 pack-year smoking history. She has been exposed to tobacco smoke. She has never used smokeless tobacco. She reports that she does not drink alcohol and does not use drugs. Medical:  has a past medical history of Atherosclerosis (1/0612014), Blood transfusion without reported diagnosis (2005), Cancer (Ovarian 1978), Chest pain, COPD (chronic obstructive pulmonary disease), COVID-19 long hauler manifesting chronic dyspnea, Emphysema of lung (2019), Oxygen deficiency (03/2019), and Oxygen dependent. Surgical: Maria [name]  has a past surgical history that includes Abdominal hysterectomy (1978); Ovarian cyst removal (1988); Neck surgery (1999); Spine surgery (Neck 2002); Cholecystectomy (2010);  Colonoscopy; Colonoscopy; Esophagogastroduodenoscopy; and Colonoscopy with propofol (N/A, 11/01/2021). Family: family history includes Arthritis in her mother; Asthma in her son; Birth defects in her son; Breast cancer (age of onset: 26) in her maternal aunt; COPD in her brother, brother, and father; Diabetes in her mother and son; Hearing loss in her father; Heart disease in her mother; Heart failure in her mother; Hypertension in her mother; Kidney disease in her mother; Lung cancer in her brother; Obesity in her mother.  Laboratory Chemistry Profile   Renal Lab Results  Component Value Date   BUN 13 09/01/2022   CREATININE 0.65 09/01/2022   GFR 92.32 09/01/2022   GFRAA >60 03/03/2019   GFRNONAA >60 03/03/2019    Hepatic Lab Results  Component Value Date   AST 20 10/15/2021   ALT 17 10/15/2021   ALBUMIN 4.2 10/15/2021   ALKPHOS 72 10/15/2021   LIPASE 19 11/09/2016    Electrolytes Lab Results  Component Value Date   NA 139 09/01/2022   K 4.0 09/01/2022   CL 103 09/01/2022   CALCIUM 9.6 09/01/2022   MG 2.0 03/19/2019   PHOS 3.4 03/19/2019    Bone Lab Results  Component Value Date   VD25OH 35.22 06/03/2019    Inflammation (CRP: Acute Phase) (ESR: Chronic Phase) Lab Results  Component Value Date   CRP 1.3 (H) 03/03/2019   ESRSEDRATE 41 (H) 07/03/2019         Note: Above Lab results reviewed.  Recent Imaging Review  VAS US CAROTID Carotid Arterial Duplex Study  Patient Name:  Maria [name]  Date of Exam:   07/28/2022 Medical  Rec #: [id number]       Accession #:    [id number] Date of Birth: 1955/10/13        Patient Gender: F Patient Age:   64 years Exam Location:  [facility] Procedure:      VAS US CAROTID Referring Phys: Levora Dredge  --------------------------------------------------------------------------------   Indications:   Carotid artery disease. Other Factors: History of right vertebral retrograde.  Performing Technologist: Hardie Lora RVT    Examination Guidelines: A complete evaluation includes B-mode imaging, spectral Doppler, color Doppler, and power Doppler as needed of all accessible portions of each vessel. Bilateral testing is considered an integral part of a complete examination. Limited examinations for reoccurring indications may be performed as noted.    Right Carotid Findings: +----------+--------+--------+--------+----------------------+--------+           PSV cm/sEDV cm/sStenosisPlaque Description    Comments +----------+--------+--------+--------+----------------------+--------+ CCA Prox  122     18                                             +----------+--------+--------+--------+----------------------+--------+ CCA Mid   111     19                                             +----------+--------+--------+--------+----------------------+--------+ CCA Distal117     24                                             +----------+--------+--------+--------+----------------------+--------+ ICA Prox  142     25      1-39%   irregular and calcific         +----------+--------+--------+--------+----------------------+--------+ ICA Mid   116     25                                             +----------+--------+--------+--------+----------------------+--------+ ICA Distal123     25                                             +----------+--------+--------+--------+----------------------+--------+ ECA       142     9                                              +----------+--------+--------+--------+----------------------+--------+  +----------+--------+-------+--------+-------------------+           PSV cm/sEDV cmsDescribeArm Pressure (mmHG) +----------+--------+-------+--------+-------------------+ [id number]     0      Stenotic                     +----------+--------+-------+--------+-------------------+  +---------+--------+--+--------+----------+ VertebralPSV cm/s43EDV cm/sRetrograde +---------+--------+--+--------+----------+     Left Carotid Findings: +----------+--------+--------+--------+----------------------+--------+           PSV cm/sEDV cm/sStenosisPlaque Description    Comments +----------+--------+--------+--------+----------------------+--------+ CCA Prox  106  16                                             +----------+--------+--------+--------+----------------------+--------+ CCA Mid   112     22                                             +----------+--------+--------+--------+----------------------+--------+ CCA Distal107     26              irregular and calcific         +----------+--------+--------+--------+----------------------+--------+ ICA Prox  355     82      60-79%  calcific and irregular         +----------+--------+--------+--------+----------------------+--------+ ICA Mid   163     41                                             +----------+--------+--------+--------+----------------------+--------+ ICA Distal86      22                                             +----------+--------+--------+--------+----------------------+--------+ ECA       267     26      >50%    calcific and irregular         +----------+--------+--------+--------+----------------------+--------+  +----------+--------+--------+--------+-------------------+           PSV cm/sEDV cm/sDescribeArm Pressure (mmHG) +----------+--------+--------+--------+-------------------+ Subclavian185                                         +----------+--------+--------+--------+-------------------+  +---------+--------+---+--------+--+ VertebralPSV cm/s119EDV cm/s22 +---------+--------+---+--------+--+        Summary: Right Carotid: Velocities in the  right ICA are consistent with a 1-39% stenosis.  Left Carotid: Velocities in the left ICA are consistent with a 60-79% stenosis.               The ECA appears >50% stenosed.  Vertebrals:  Left vertebral artery demonstrates antegrade flow. Right vertebral              artery demonstrates retrograde flow. Subclavians: Right subclavian artery was stenotic. Normal flow hemodynamics were              seen in the left subclavian artery.  *See table(s) above for measurements and observations.    Electronically signed by Levora Dredge MD on 07/28/2022 at 4:32:57 PM.      Final   Note: Reviewed        Physical Exam  General appearance: Well nourished, well developed, and well hydrated. In no apparent acute distress Mental status: Alert, oriented x 3 (person, place, & time)        Eyes: PERLA Vitals: BP (!) 139/49   Pulse 65   Temp (!) 97.3 F (36.3 C) (Temporal)   Resp 18   Ht  (1.651 m)   Wt 141 lb (64 kg)   SpO2 100% Comment: 4L of O2  BMI 23.46 kg/m  BMI: Estimated body mass index is 23.46 kg/m as calculated from the following:   Height as of this encounter: 5\' 5"  (1.651 m).   Weight as of this encounter: 141 lb (64 kg). Ideal: Ideal body weight: 57 kg (125 lb 10.6 oz) Adjusted ideal body weight: 59.8 kg (131 lb 12.8 oz)  Respiratory: Oxygen-dependent COPD 3 L oxygen continuous   Cervical Spine Area Exam  Skin & Axial Inspection: Well healed scar from previous spine surgery detected Alignment: Symmetrical Functional ROM: Pain restricted ROM     severely limited on the right Stability: No instability detected Muscle Tone/Strength: Functionally intact. No obvious neuro-muscular anomalies detected. Sensory (Neurological): Neurogenic pain pattern Palpation: No palpable anomalies             Positive Spurling's on the right   Upper Extremity (UE) Exam      Side: Right upper extremity   Side: Left upper extremity  Skin & Extremity Inspection: Skin color, temperature,  and hair growth are WNL. No peripheral edema or cyanosis. No masses, redness, swelling, asymmetry, or associated skin lesions. No contractures.   Skin & Extremity Inspection: Skin color, temperature, and hair growth are WNL. No peripheral edema or cyanosis. No masses, redness, swelling, asymmetry, or associated skin lesions. No contractures.  Functional ROM: Pain restricted ROM for shoulder and elbow   Functional ROM: Unrestricted ROM          Muscle Tone/Strength: Functionally intact. No obvious neuro-muscular anomalies detected.   Muscle Tone/Strength: Functionally intact. No obvious neuro-muscular anomalies detected.  Sensory (Neurological): Neurogenic pain pattern           Sensory (Neurological): Unimpaired          Palpation: No palpable anomalies               Palpation: No palpable anomalies              Provocative Test(s):  Phalen's test: deferred Tinel's test: deferred Apley's scratch test (touch opposite shoulder):  Action 1 (Across chest): Decreased ROM Action 2 (Overhead): Decreased ROM Action 3 (LB reach): Decreased ROM     Provocative Test(s):  Phalen's test: deferred Tinel's test: deferred Apley's scratch test (touch opposite shoulder):  Action 1 (Across chest): deferred Action 2 (Overhead): deferred Action 3 (LB reach): deferred       Left foot in boot due to metatarsal fracture, swelling  Assessment   Diagnosis Status  1. Cervical fusion syndrome (C6-7)   2. Chronic right shoulder pain   3. Cervical disc disorder with radiculopathy of cervical region (right)   4. Chronic pain syndrome      Persistent Persistent Persistent   Updated Problems: No problems updated.     Plan of Care    Maria [name] has a current medication list which includes the following long-term medication(s): albuterol, atorvastatin, azelastine hcl, escitalopram, ipratropium-albuterol, pantoprazole, potassium, roflumilast, and gabapentin.  Maria [name] is a pleasant 67 year old female  who presents with a chief complaint of cervical spine pain with radiation into her right arm down to her thumb and index finger predominantly.  She has severely limited range of motion.  She has a history of cervical spinal fusion at C6-C7.  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  She has done home directed physical therapy exercises in the past but due to pain and severely limited range of motion, she was unable to participate  in them completely.  She is on 3 L of home oxygen due to COPD.  She is high risk for surgery and not deemed a surgical candidate.   Currently on 600 mg TID  Using TENS unit for neck.   She has mild glenohumeral arthritis in both shoulders.  She has multilevel cervical degenerative disc disease with endplate changes.  I discussed an interlaminar cervical epidural steroid injection for right cervical radicular pain and she would like to proceed with it.  Future considerations: diagnostic cervical facet medial branch nerve blocks for cervical generative disc disease and cervical arthropathy.   Pharmacotherapy (Medications Ordered): Meds ordered this encounter  Medications   gabapentin (NEURONTIN) 600 MG tablet    Sig: Take 1 tablet (600 mg total) by mouth every 8 (eight) hours.    Dispense:  90 tablet    Refill:  5    Fill one day early if pharmacy is closed on scheduled refill date. May substitute for generic if available.   Orders:  Orders Placed This Encounter  Procedures   Cervical Epidural Injection    Sedation: Patient's choice. Purpose: Diagnostic/Therapeutic Indication(s): Radiculitis and cervicalgia associater with cervical degenerative disc disease.    Standing Status:   Future    Standing Expiration Date:   02/02/2023    Scheduling Instructions:     Procedure: Cervical Epidural Steroid Injection/Block     Level(s): C7-T1     Laterality: TBD     Timeframe: As  soon as schedule allows    Order Specific Question:   Where will this procedure be performed?    Answer:   [facility]    Comments:   Maria [name]   Follow-up plan:   Return in about 25 days (around 11/28/2022) for Right C-ESI , in clinic (PO Valium).     Future interventional options: -Right cervical ESI -Cervical facet medial branch nerve blocks -Glenohumeral joint injection -Suprascapular nerve block  Also consider trial of Lyrica   Recent Visits Date Type Provider Dept  08/09/22 Office Visit Edward Jolly, MD [facility]  Showing recent visits within past 90 days and meeting all other requirements Today's Visits Date Type Provider Dept  11/03/22 Office Visit Edward Jolly, MD [facility]  Showing today's visits and meeting all other requirements Future Appointments No visits were found meeting these conditions. Showing future appointments within next 90 days and meeting all other requirements  I discussed the assessment and treatment plan with the patient. The patient was provided an opportunity to ask questions and all were answered. The patient agreed with the plan and demonstrated an understanding of the instructions.  Patient advised to call back or seek an in-person evaluation if the symptoms or condition worsens.  Duration of encounter: .  Total time on encounter, as per AMA guidelines included both the face-to-face and non-face-to-face time personally spent by the physician and/or other qualified health care professional(s) on the day of the encounter (includes time in activities that require the physician or other qualified health care professional and does not include time in activities normally performed by clinical staff). Physician's time may include the following activities when performed: preparing to see the patient (eg, review of tests, pre-charting review of records) obtaining and/or reviewing separately obtained  history performing a medically appropriate examination and/or evaluation counseling and educating the patient/family/caregiver ordering medications, tests, or procedures referring and communicating with other health care professionals (when not separately reported) documenting clinical information in the electronic or other health record independently  interpreting results (not separately reported) and communicating results to the patient/ family/caregiver care coordination (not separately reported)  Note by: Edward Jolly, MD Date: 11/03/2022; Time: 9:38 AM

## 2022-11-07 ENCOUNTER — Ambulatory Visit: Payer: Medicare HMO | Admitting: Physician Assistant

## 2022-11-07 ENCOUNTER — Other Ambulatory Visit: Payer: Self-pay | Admitting: Adult Health

## 2022-11-07 ENCOUNTER — Other Ambulatory Visit: Payer: Self-pay | Admitting: Internal Medicine

## 2022-11-07 DIAGNOSIS — N3946 Mixed incontinence: Secondary | ICD-10-CM | POA: Diagnosis not present

## 2022-11-07 NOTE — Patient Instructions (Signed)
Tracking Your Bladder Symptoms   Patient Name:___________________________________________________  Example: Day   Daytime Voids  Nighttime Voids Urgency for the Day (none, mild, strong, severe) Number of Accidents/ Leaks Beverage Comments  Monday IIII II Strong I Water IIII Coffee  I     Week Starting:____________________________________  Day Daytime  Voids Nighttime  Voids Urgency for the Day (none, mild, strong, severe) Number of Accidents/ Leaks Beverages Comments                                                           This week my symptoms were:  O much better O better O the same O worse   

## 2022-11-07 NOTE — Progress Notes (Signed)
PTNS  Session # 9  Health & Social Factors: no change Caffeine: 2 Alcohol: 0 Daytime voids #per day: 3 Night-time voids #per night: 2-3 Urgency: none Incontinence Episodes #per day: 3 Ankle used: right Treatment Setting: 5 Feeling/ Response: sensory Comments: Patient tolerated well.  Performed By: Carman Ching, PA-C and Mammie Lorenzo, RN  Follow Up: 1 week for PTNS #10

## 2022-11-09 ENCOUNTER — Encounter: Payer: Medicare HMO | Admitting: *Deleted

## 2022-11-09 DIAGNOSIS — J441 Chronic obstructive pulmonary disease with (acute) exacerbation: Secondary | ICD-10-CM

## 2022-11-09 DIAGNOSIS — Z006 Encounter for examination for normal comparison and control in clinical research program: Secondary | ICD-10-CM

## 2022-11-09 MED ORDER — STUDY - ARNASA GB44332 - ASTEGOLIMAB 238 MG/1.7 ML OR PLACEBO SQ INJECTION (PI-RAMASWAMY)
476.0000 mg | INJECTION | SUBCUTANEOUS | Status: DC
  Administered 2022-11-09: 476 mg via SUBCUTANEOUS
  Filled 2022-11-09: qty 3.4

## 2022-11-09 NOTE — Research (Signed)
Title: A Phase III, randomized, double-blind, placebo controlled, multicenter study to evaluate the efficacy and safety of astegolimab in patients with chronic obstructive pulmonary disease    Dose and Duration of Treatment: Astegolimab is presented as a sterile, slightly brown-yellow solution. Each single-use, 2.25 mL pre-filled syringe contains 1.7 mL deliverable volume. Astegolimab drug product is formulated at 140 mg/mL astegolimab with 114 mM succinic acid, 200 mM L-arginine, 10 mM L-methionine, 0.06% (w/v) polysorbate 20, pH 5.7. Placebo for astegolimab is supplied in an identical pre-filled syringe configuration.   Protocol # [id number] Protocol: Version 3, dated 26/May2023, Version 4 dated 20Jun2023 IB: version 8 dated April 2023 ICF: Main version 12Apr2023, revised 17July2023 Mobile Nursing 01Sep2023, revised 01Sep2023 Lab Manual: V4.0.0 20Oct2023   Investigator Brochure Product: Astegolimab [phone])   Mechanism of action: Astegolimab (also known as [id number] or [id number]) is a fully human, IgG2 monoclonal antibody that binds with high affinity to the interleukin (IL)-33 (IL-33) receptor, ST2, thereby blocking the signaling of IL-33, an inflammatory cytokine of the IL-1 family and member of the "alarmin" class of molecules. Astegolimab binds with high affinity to the human and cynomolgus monkey receptor for IL-33, ST2, and blocks IL-33 binding, thus inhibiting association with the IL-1R accessory protein (AcP) co-receptor and formation of an activated receptor complex.   Key Inclusion Criteria: Age 70-80 years at Visit 1   Documented COPD diagnosis for ?12 months prior to Visit 1 History of frequent exacerbations, defined as having had 2 or more moderate or severe COPD exacerbations within 12 months prior to Visit 1 Post-bronchodilator FEV1 ?20% and <80% of predicted at Visit 1 or Visit 2  Post-bronchodilator FEV1/FVC <0.70 at Visit 1 or Visit 2  mMRC score ?2 at screening   Current  tobacco smoker or former smoker (having stopped smoking for at least 6 months prior to Visit 1) with a history of smoking ?10 pack-years On optimized COPD maintenance therapy as defined below for ?12 months prior to Visit 1            -Inhaled corticosteroid (ICS) plus long-acting beta-agonist (LABA)           -Long-acting muscarinic antagonist (LAMA) plus LABA           -ICS plus LAMA plus LABA  Key Exclusion Criteria: Current documented diagnosis of asthma  Diagnosis of a-1 antitrypsin deficiency  History of long-term treatment with oxygen at >4.0 liters/minute  Any infection that resulted in hospital admission for ?24 hours and/or treatment with oral, IV, or IM antibiotics within 4 weeks prior to Visit 1 or during screening Upper or lower respiratory tract infection within 4 weeks prior to Visit 1 or during screening Treatment with oral, IV, or IM corticosteroids (>10 mg/day prednisolone or equivalent) within 4 weeks prior to initiation of study drug Treatment with a licensed biologic agent (e.g., omalizumab, dupilumab, and/or anti-IL-5 therapies) within 3 months or 5 drug-elimination half-lives (whichever is longer) prior to screening  Planned surgical intervention during the study Known immunodeficiency, including but not limited to, HIV infection  AST, ALT, or total bilirubin elevation ?2.0 x the upper limit of normal (ULN) during screening  History of malignancy within 5 years prior to screening, with the exception of malignancies with a negligible risk of metastasis or death (e.g., 5-year overall survival rate >90%), such as adequately treated carcinoma in situ of the cervix, non-melanoma carcinoma, localized prostate cancer, or ductal carcinoma in situ Unstable cardiac disease, myocardial infarction, or New York Heart Association Class III or IV heart  failure within 12 months prior to screening History or absence of an abnormal ECG that is deemed clinically significant by the  investigator, including complete left bundle branch block or second- or third-degree atrioventricular heart block     Integrated Pharmacokinetic/Pharmacodynamic Analysis:  In Studies [id number], [id number], and XB14782, exploratory biomarker analysis showed there were consistent decreases in blood eosinophil counts throughout the treatment period, potentially mediated by a direct effect of IL-33 on eosinophil progenitors. In [place], there was no significant difference in fractional exhaled nitric oxide (FeNO) levels (reflecting airway IL-4/IL-13 activity) between astegolimab-treated groups relative to placebo throughout the treatment period. These data suggest that astegolimab has only a limited effect on Type 2 inflammation in asthma. No data are applicable for Study [id number]. No data are available yet for Study [id number].  Special Warnings/Considerations: Administration of astegolimab, a protein therapeutic, may lead to the development of anti-astegolimab antibodies which could lead to AEs and/or decreased exposure. In non-clinical studies (see Section 4.2), ADA incidence has generally been low and there was no apparent ADA impact on PK and safety in these studies. To date, the immunogenicity rates observed with astegolimab in clinical studies have been relatively low as well (see Immunogenicity Section 5.6). Several clinical studies have been conducted in patients with asthma, atopic dermatitis, COPD (IIS Study NG29528) and COVID-19 severe pneumonia, which generally showed low incidence of ADAs (Table 31). There has been no correlation between ADA status and clinical findings or increased incidence of AEs. Astegolimab is now being considered in a larger study for the treatment of COPD. This patient population is typically considered to have a hyper-responsive immune system. Route of administration for this molecule will be Spry, either Q2W or Q4W. These factors increase the risk of development of an immune  response to astegolimab, specifically with repeat dosing. From the previous IIS Study [id number], incidence of ADAs was low in COPD patients; this remains to be confirmed in a larger study. To monitor ADA development in ongoing studies, serum samples will be collected from patients at protocol-defined intervals. Patients who test positive for antibodies and have clinical sequelae that are considered potentially related to an ADA response may also be asked to return for additional follow-up testing.  Drug Interaction Studies: No PK drug interaction studies have been conducted to date.  Serious Adverse Reactions Observed in Asthma Study: During the treatment period, a similar proportion of patients across all cohorts experienced at least one AE, regardless of causality (Table 22). In total, 77.2%, 70.9%, 72.2%, and 72.1% of patients reported at least 1 AE in the placebo, 70 mg, 210 mg, and 490 mg groups, respectively. The most common AE's (>5% in any treatment group) were asthma, nasopharyngitis, upper respiratory tract infection, headache, and injection site reaction (ISR). The most common drug-related AE was ISR, which was reported more frequently in the astegolimab treatment groups than in the placebo group (1 [0.8%] patient in the placebo group, 10 [7.9%] patients with 70 mg, 8 [6.3%] patients with 210 mg, 6 [4.9%] patients with 490 mg). All ISRs were non-serious and mild or moderate in severity. During the treatment period, 50 SAEs were reported in 37 (7.4%) patients. The number of patients reporting SAEs was comparable across all cohorts (11 SAEs in 8 patients on placebo, 21 SAEs in 14 patients on 70 mg, 11 SAEs in 9 patients on 210 mg, and 7 SAEs in 6 patients on 490 mg). The most common SAE was asthma. One SAE of moderate livedo reticularis (70 mg) was considered  a suspected unexpected serious adverse drug reaction (SUSAR) related to astegolimab and was reported two days after the second dose, leading to  discontinuation of astegolimab. Two (0.4%) patients reported anaphylaxis and hypersensitivity reactions: 1 severe SAE of anaphylactic reaction (placebo), and 1 moderate hypersensitivity (490 mg) considered related to astegolimab. Three (0.6%) patients experienced a potential Major Adverse Cardiac Event (MACE) (1 patient each in the placebo, 70 mg, and 210 mg groups). None of the potential MACE were considered related to astegolimab. Overall, 233 (46.4%) patients reported events of infection. A comparable number of patients reported infection across all treatment groups (65 [51.2%] patients on placebo, 55 [43.3%] patients on 70 mg, 58 [46.0%] patients on 210 mg, and 55 [45.1%] patients on 490 mg). The most frequently reported infection (?10% incidence) was nasopharyngitis (12.7%). One patient on astegolimab 210 mg and the partner of one patient on placebo became pregnant during the study. Both delivered normal/healthy babies. Two deaths, unrelated to study drug, were reported: one patient on 210 mg astegolimab died following an SAE of asthma; the other patient on 490 mg astegolimab had an unexplained death. There were no clinically meaningful changes in laboratory parameters, vital signs, or ECG results, other than the 10% decrease in mean blood eosinophil counts in the astegolimab-treated groups, with no safety concerns. Treatment-induced ADAs were comparable between the astegolimab groups and had no impact on safety. Overall, astegolimab was well tolerated at all doses used and had a safety profile consistent with that observed in the previous astegolimab Phase I studies.  Serious Adverse Reactions Observed in Previous COPD Study: In the completed IIS Study [id number], a total of 81 patients received at least one dose of astegolimab or placebo. The safety profile of astegolimab was similar to that of placebo. There were a total of 222 AEs reported in 62 patients. A total of 28 (72%) patients in the placebo arm and  34 (81%) patients in the astegolimab arm reported at least one AE. The most commonly reported AEs were headache followed by urinary tract infection and viral upper respiratory tract infection. A total of 39 SAEs were reported in 28 (35%) patients; 16 (41%) patients in the placebo group and 12 (29%) in the astegolimab group. The most commonly reported SAE was hospital admission for community acquired pneumonia. Four SAEs resulted in patient discontinuation from treatment (1 patient in the placebo group and 3 patients in the astegolimab group). One patient in the placebo group experienced an AESI of potential MACE (heart failure, unrelated to the study treatment). No anaphylaxis or pregnancies were reported. Two deaths, unrelated to study treatment, were reported: 1 patient in the placebo group died after hospital acquired pneumonia, and another patient in the placebo group died after pneumonia and type 2 respiratory failure.  Safety Data: Astegolimab has been generally well tolerated. There have been 49 patient deaths across all astegolimab studies (Section 5.5.2), none of which were considered related to astegolimab. A total of 144 subjects experienced a total of 224 SAEs across all astegolimab studies. Of these, an SAE livedo reticularis observed in 1 patient was considered related to astegolimab by the investigator (Section 5.5.3). AEs leading to withdrawal (Section 5.5.4) have generally occurred at a rate similar to what would be expected for clinical trials in the studies' respective indications.   Maria [name] @ [facility] Clinical Research Coordinator note :    This visit for Subject [id number] with DOB: Mar 26, 1956 on 09 November 2022 for the above protocol is Visit/Encounter #  and is for purpose  of research.    The consent for this encounter is under Protocol Version 2.0 , Investigator Brochure Version 7, Consent Version IRB Approved  revised  and is currently IRB approved.    Subject expressed continued  interest and consent in continuing as a study subject. Subject confirmed that there was no change in contact information (e.g. address, telephone, email). Subject thanked for participation in research and contribution to science.   The Subject was informed that the PI Dr. Marchelle Gearing continues to have oversight of the subject's visits and course  through relevant discussions, reviews and also specifically of this visit by routing of this note to the PI.  Maria [name] @ [facility] Clinical Research Coordinator note:   This visit for Subject Maria [name] with DOB: Apr 22, 1956 on 11/09/2022 for the above protocol is Visit/Encounter # 5 week 6  and is for purpose of research.  Protocol: Version 3, dated 26/May2023, Version 4 dated 20Jun2023 IB: version 8 dated April 2023 ICF: Main version 12Apr2023, revised 17July2023 Mobile Nursing 01Sep2023, revised 01Sep2023 Lab Manual: V4.0.0 20Oct2023  Subject expressed continued interest and consent in continuing as a study subject. Subject confirmed that there was  NO  change in contact information (e.g. address, telephone, email). Subject thanked for participation in research and contribution to science. In this visit 11/09/2022 the subject will receive IP injection. The Subject was Yes informed that the PI Kalman Shan, MD continues to have oversight of the subject's visits and course through relevant discussions, reviews, and also specifically of this visit by routing of this note to the PI.   Signed by  Jerolyn Shin, CMA  Clinical Research Coordinator / Nurse Maria [name]  [place], [place] 10:49 AM 11/09/2022

## 2022-11-09 NOTE — Research (Signed)
LATE ENTRY:   Title: A Phase III, randomized, double-blind, placebo controlled, multicenter study to evaluate the efficacy and safety of astegolimab in patients with chronic obstructive pulmonary disease    Dose and Duration of Treatment: Astegolimab is presented as a sterile, slightly brown-yellow solution. Each single-use, 2.25 mL pre-filled syringe contains 1.7 mL deliverable volume. Astegolimab drug product is formulated at 140 mg/mL astegolimab with 114 mM succinic acid, 200 mM L-arginine, 10 mM L-methionine, 0.06% (w/v) polysorbate 20, pH 5.7. Placebo for astegolimab is supplied in an identical pre-filled syringe configuration.   [facility] @ [facility] Clinical Research Coordinator note:   This visit for Subject Maria [name] with DOB: April 16, 1956 on 09/15/2022 for the above protocol is Visit/Encounter # Screening  and is for purpose of research.   The consent for this encounter is under  Protocol: Version 3, dated 26/May2023, Version 4 dated 20Jun2023, IB: version 8 dated April 2023 ICF: Main version 12Apr2023, revised 17July2023 is currently IRB approved.   Subject expressed continued interest and consent in continuing as a study subject. Subject confirmed that there was no change in contact information (e.g. address, telephone, email). Subject thanked for participation in research and contribution to science. In this visit 09/15/2022 the subject will be evaluated by Principal Investigator named Kalman Shan, MD. This research coordinator has verified that the above investigator is  YES/NOT up to date with his/her training logs.    This visit is a key visit of screening. The PI is available for this visit.    Visit completed per the above mentioned protocol. Refer to the subjects binder for further details of the visit.   Signed by Carron Curie, BS, CMA, [facility] Clinical Research Coordinator [place], [place] 4:09 PM 11/09/2022

## 2022-11-10 ENCOUNTER — Encounter: Payer: Self-pay | Admitting: Student in an Organized Health Care Education/Training Program

## 2022-11-10 NOTE — Telephone Encounter (Signed)
Alona Bene does the prior [place]. When it is approved she will give you a call and schedule an appointment. Any questions, please call and for Alona Bene.

## 2022-11-14 ENCOUNTER — Ambulatory Visit: Payer: Medicare HMO | Admitting: Physician Assistant

## 2022-11-14 DIAGNOSIS — N3946 Mixed incontinence: Secondary | ICD-10-CM | POA: Diagnosis not present

## 2022-11-14 NOTE — Patient Instructions (Signed)
ICD-10-CM   1. Research exam  Z00.6     2. COPD, frequent exacerbations (HCC)  J44.1       Per protocol

## 2022-11-14 NOTE — Progress Notes (Signed)
PTNS  Session # 10  Health & Social Factors: no change Caffeine: 2 Alcohol: 0 Daytime voids #per day: 3 Night-time voids #per night: 2 Urgency: strong Incontinence Episodes #per day: 3-4 Ankle used: right Treatment Setting: 9 Feeling/ Response: sensory Comments: none  Performed By: Jearld Pies RMA  Follow Up: 1 week

## 2022-11-14 NOTE — Patient Instructions (Signed)
Tracking Your Bladder Symptoms   Patient Name:___________________________________________________  Example: Day   Daytime Voids  Nighttime Voids Urgency for the Day (none, mild, strong, severe) Number of Accidents/ Leaks Beverage Comments  Monday IIII II Strong I Water IIII Coffee  I     Week Starting:____________________________________  Day Daytime  Voids Nighttime  Voids Urgency for the Day (none, mild, strong, severe) Number of Accidents/ Leaks Beverages Comments                                                           This week my symptoms were:  O much better O better O the same O worse   

## 2022-11-14 NOTE — Progress Notes (Signed)
Title: A Phase III, randomized, double-blind, placebo controlled, multicenter study to evaluate the efficacy and safety of astegolimab in patients with chronic obstructive pulmonary disease    Dose and Duration of Treatment: Astegolimab is presented as a sterile, slightly brown-yellow solution. Each single-use, 2.25 mL pre-filled syringe contains 1.7 mL deliverable volume. Astegolimab drug product is formulated at 140 mg/mL astegolimab with 114 mM succinic acid, 200 mM L-arginine, 10 mM L-methionine, 0.06% (w/v) polysorbate 20, pH 5.7. Placebo for astegolimab is supplied in an identical pre-filled syringe configuration.   Protocol # [id number]   Sponsor: F. Mikey [employer]- [employer] [address] [place]    S This visit for Subject WILLODEAN LEVEN with DOB: [birth year] on 09/28/22 for the above protocol is Visit/Encounter # visit 2/day  and is for purpose of reserch . Subject/LAR expressed continued interest and consent in continuing as a study subject. Subject thanked for participation in research and contribution to science.    No issues  Obj Exam in paper chart  A Research cOPD  Plan  - per protocol   SIGNATURE    Dr. Kalman Shan, M.D., F.C.C.P, ACRP-CPI Pulmonary and Critical Care Medicine Research Investigator, [facility] @ [facility] Health Staff Physician, [facility] Center Director - Interstitial Lung Disease  Program  Pulmonary Fibrosis [facility] [place] [facility] and [facility] @ [facility] [place], [place], [address]   Pager: [phone], If no answer  OR between  19:00-7:00h: page [phone] Telephone (research): [phone]  10:49 AM 11/14/2022   10:49 AM 11/14/2022

## 2022-11-21 ENCOUNTER — Ambulatory Visit: Payer: Medicare HMO | Admitting: Physician Assistant

## 2022-11-21 DIAGNOSIS — N3946 Mixed incontinence: Secondary | ICD-10-CM

## 2022-11-21 NOTE — Progress Notes (Signed)
PTNS   Session # 11   Health & Social Factors: no change Caffeine: 2 Alcohol: 0 Daytime voids #per day: 3 Night-time voids #per night: 2 Urgency: strong Incontinence Episodes #per day: 3-4 Ankle used: right Treatment Setting: 7 Feeling/ Response: sensory Comments: none   Performed By: Mammie Lorenzo, RN   Follow Up: 1 week

## 2022-11-21 NOTE — Patient Instructions (Signed)
Tracking Your Bladder Symptoms   Patient Name:___________________________________________________  Example: Day   Daytime Voids  Nighttime Voids Urgency for the Day (none, mild, strong, severe) Number of Accidents/ Leaks Beverage Comments  Monday IIII II Strong I Water IIII Coffee  I     Week Starting:____________________________________  Day Daytime  Voids Nighttime  Voids Urgency for the Day (none, mild, strong, severe) Number of Accidents/ Leaks Beverages Comments                                                           This week my symptoms were:  O much better O better O the same O worse   

## 2022-11-22 DIAGNOSIS — J9611 Chronic respiratory failure with hypoxia: Secondary | ICD-10-CM | POA: Diagnosis not present

## 2022-11-22 DIAGNOSIS — J449 Chronic obstructive pulmonary disease, unspecified: Secondary | ICD-10-CM | POA: Diagnosis not present

## 2022-11-23 ENCOUNTER — Encounter: Payer: Medicare HMO | Admitting: *Deleted

## 2022-11-23 DIAGNOSIS — Z006 Encounter for examination for normal comparison and control in clinical research program: Secondary | ICD-10-CM

## 2022-11-23 DIAGNOSIS — J441 Chronic obstructive pulmonary disease with (acute) exacerbation: Secondary | ICD-10-CM

## 2022-11-23 MED ORDER — STUDY - ARNASA GB44332 - ASTEGOLIMAB 238 MG/1.7 ML OR PLACEBO SQ INJECTION (PI-RAMASWAMY)
476.0000 mg | INJECTION | SUBCUTANEOUS | Status: DC
  Administered 2022-11-23: 476 mg via SUBCUTANEOUS
  Filled 2022-11-23: qty 3.4

## 2022-11-23 NOTE — Research (Signed)
Title: A Phase III, randomized, double-blind, placebo controlled, multicenter study to evaluate the efficacy and safety of astegolimab in patients with chronic obstructive pulmonary disease    Dose and Duration of Treatment: Astegolimab is presented as a sterile, slightly brown-yellow solution. Each single-use, 2.25 mL pre-filled syringe contains 1.7 mL deliverable volume. Astegolimab drug product is formulated at 140 mg/mL astegolimab with 114 mM succinic acid, 200 mM L-arginine, 10 mM L-methionine, 0.06% (w/v) polysorbate 20, pH 5.7. Placebo for astegolimab is supplied in an identical pre-filled syringe configuration.   Protocol # [id number]   Sponsor: [employer] [address] [place] Protocol: Version 3, dated 26/May2023, Version 4 dated 20Jun2023 IB: version 8 dated April 2023 ICF: Main version 12Apr2023, revised 17July2023 Mobile Nursing 01Sep2023, revised 01Sep2023 Lab Manual: V4.0.0 20Oct2023   Mechanism of action: Astegolimab (also known as [id number] or [id number]) is a fully human, IgG2 monoclonal antibody that binds with high affinity to the interleukin (IL)-33 (IL-33) receptor, ST2, thereby blocking the signaling of IL-33, an inflammatory cytokine of the IL-1 family and member of the "alarmin" class of molecules. Astegolimab binds with high affinity to the human and cynomolgus monkey receptor for IL-33, ST2, and blocks IL-33 binding, thus inhibiting association with the IL-1R accessory protein (AcP) co-receptor and formation of an activated receptor complex.    Integrated Pharmacokinetic/Pharmacodynamic Analysis:  In Studies [id number], [id number], and [id number], exploratory biomarker analysis showed there were consistent decreases in blood eosinophil counts throughout the treatment period, potentially mediated by a direct effect of IL-33 on eosinophil progenitors. In [place], there was no significant difference in fractional exhaled nitric oxide (FeNO)  levels (reflecting airway IL-4/IL-13 activity) between astegolimab-treated groups relative to placebo throughout the treatment period. These data suggest that astegolimab has only a limited effect on Type 2 inflammation in asthma. No data are applicable for Study [id number]. No data are available yet for Study [id number].  Special Warnings/Considerations: Administration of astegolimab, a protein therapeutic, may lead to the development of anti-astegolimab antibodies which could lead to AEs and/or decreased exposure. In non-clinical studies (see Section 4.2), ADA incidence has generally been low and there was no apparent ADA impact on PK and safety in these studies. To date, the immunogenicity rates observed with astegolimab in clinical studies have been relatively low as well (see Immunogenicity Section 5.6). Several clinical studies have been conducted in patients with asthma, atopic dermatitis, COPD (IIS Study BM84132) and COVID-19 severe pneumonia, which generally showed low incidence of ADAs (Table 31). There has been no correlation between ADA status and clinical findings or increased incidence of AEs. Astegolimab is now being considered in a larger study for the treatment of COPD. This patient population is typically considered to have a hyper-responsive immune system. Route of administration for this molecule will be [place], either Q2W or Q4W. These factors increase the risk of development of an immune response to astegolimab, specifically with repeat dosing. From the previous IIS Study [id number], incidence of ADAs was low in COPD patients; this remains to be confirmed in a larger study. To monitor ADA development in ongoing studies, serum samples will be collected from patients at protocol-defined intervals. Patients who test positive for antibodies and have clinical sequelae that are considered potentially related to an ADA response may also be asked to return for additional follow-up testing.  Drug Interaction  Studies: No PK drug interaction studies have been conducted to date.  Serious Adverse Reactions Observed in Asthma Study: During the treatment period, a similar proportion of  patients across all cohorts experienced at least one AE, regardless of causality (Table 22). In total, 77.2%, 70.9%, 72.2%, and 72.1% of patients reported at least 1 AE in the placebo, 70 mg, 210 mg, and 490 mg groups, respectively. The most common AE's (>5% in any treatment group) were asthma, nasopharyngitis, upper respiratory tract infection, headache, and injection site reaction (ISR). The most common drug-related AE was ISR, which was reported more frequently in the astegolimab treatment groups than in the placebo group (1 [0.8%] patient in the placebo group, 10 [7.9%] patients with 70 mg, 8 [6.3%] patients with 210 mg, 6 [4.9%] patients with 490 mg). All ISRs were non-serious and mild or moderate in severity. During the treatment period, 50 SAEs were reported in 37 (7.4%) patients. The number of patients reporting SAEs was comparable across all cohorts (11 SAEs in 8 patients on placebo, 21 SAEs in 14 patients on 70 mg, 11 SAEs in 9 patients on 210 mg, and 7 SAEs in 6 patients on 490 mg). The most common SAE was asthma. One SAE of moderate livedo reticularis (70 mg) was considered a suspected unexpected serious adverse drug reaction (SUSAR) related to astegolimab and was reported two days after the second dose, leading to discontinuation of astegolimab. Two (0.4%) patients reported anaphylaxis and hypersensitivity reactions: 1 severe SAE of anaphylactic reaction (placebo), and 1 moderate hypersensitivity (490 mg) considered related to astegolimab. Three (0.6%) patients experienced a potential Major Adverse Cardiac Event (MACE) (1 patient each in the placebo, 70 mg, and 210 mg groups). None of the potential MACE were considered related to astegolimab. Overall, 233 (46.4%) patients reported events of infection. A comparable number of  patients reported infection across all treatment groups (65 [51.2%] patients on placebo, 55 [43.3%] patients on 70 mg, 58 [46.0%] patients on 210 mg, and 55 [45.1%] patients on 490 mg). The most frequently reported infection (?10% incidence) was nasopharyngitis (12.7%). One patient on astegolimab 210 mg and the partner of one patient on placebo became pregnant during the study. Both delivered normal/healthy babies. Two deaths, unrelated to study drug, were reported: one patient on 210 mg astegolimab died following an SAE of asthma; the other patient on 490 mg astegolimab had an unexplained death. There were no clinically meaningful changes in laboratory parameters, vital signs, or ECG results, other than the 10% decrease in mean blood eosinophil counts in the astegolimab-treated groups, with no safety concerns. Treatment-induced ADAs were comparable between the astegolimab groups and had no impact on safety. Overall, astegolimab was well tolerated at all doses used and had a safety profile consistent with that observed in the previous astegolimab Phase I studies.  Serious Adverse Reactions Observed in Previous COPD Study: In the completed IIS Study [id number], a total of 81 patients received at least one dose of astegolimab or placebo. The safety profile of astegolimab was similar to that of placebo. There were a total of 222 AEs reported in 62 patients. A total of 28 (72%) patients in the placebo arm and 34 (81%) patients in the astegolimab arm reported at least one AE. The most commonly reported AEs were headache followed by urinary tract infection and viral upper respiratory tract infection. A total of 39 SAEs were reported in 28 (35%) patients; 16 (41%) patients in the placebo group and 12 (29%) in the astegolimab group. The most commonly reported SAE was hospital admission for community acquired pneumonia. Four SAEs resulted in patient discontinuation from treatment (1 patient in the placebo group and 3  patients in  the astegolimab group). One patient in the placebo group experienced an AESI of potential MACE (heart failure, unrelated to the study treatment). No anaphylaxis or pregnancies were reported. Two deaths, unrelated to study treatment, were reported: 1 patient in the placebo group died after hospital acquired pneumonia, and another patient in the placebo group died after pneumonia and type 2 respiratory failure.  Safety Data: Astegolimab has been generally well tolerated. There have been 49 patient deaths across all astegolimab studies (Section 5.5.2), none of which were considered related to astegolimab. A total of 144 subjects experienced a total of 224 SAEs across all astegolimab studies. Of these, an SAE livedo reticularis observed in 1 patient was considered related to astegolimab by the investigator (Section 5.5.3). AEs leading to withdrawal (Section 5.5.4) have generally occurred at a rate similar to what would be expected for clinical trials in the studies' respective indications.   [facility] [place] Clinical Research Coordinator note :    This visit for Subject [id number] with DOB: [birth year] on 23 Nov 2022 for the above protocol is Visit #6 /Week #8  and is for purpose of research.    The consent for this encounter is under Protocol Version 2.0 , Investigator Brochure Version 7, Consent Version IRB Approved  revised  and is currently IRB approved.    Subject expressed continued interest and consent in continuing as a study subject. Subject confirmed that there was no change in contact information (e.g. address, telephone, email). Subject thanked for participation in research and contribution to science.  In this visit  the subject will be evaluated by Kalman Shan, MDThis research coordinator has verified that the above investigator is up to date with his/her training logs.    The Subject was informed that the PI Dr. Marchelle Gearing continues to have oversight of the subject's  visits and course  through relevant discussions, reviews and also specifically of this visit by routing of this note to the PI.  All procedures and assessments were completed per above stated protocol.  Subject tolerated injection well.  Refer to the subjects paper source binder for further details of visit.   Signed by Jerolyn Shin, BA, CMA Clinical Research Coordinator  [facility]  [place], [place]

## 2022-11-28 ENCOUNTER — Ambulatory Visit: Payer: Medicare HMO | Admitting: Physician Assistant

## 2022-11-28 ENCOUNTER — Ambulatory Visit: Payer: Medicare HMO | Admitting: Urology

## 2022-11-29 ENCOUNTER — Ambulatory Visit: Payer: Medicare HMO | Admitting: Physician Assistant

## 2022-11-29 DIAGNOSIS — N3946 Mixed incontinence: Secondary | ICD-10-CM | POA: Diagnosis not present

## 2022-11-29 DIAGNOSIS — M25511 Pain in right shoulder: Secondary | ICD-10-CM | POA: Diagnosis not present

## 2022-11-29 DIAGNOSIS — S92352S Displaced fracture of fifth metatarsal bone, left foot, sequela: Secondary | ICD-10-CM | POA: Diagnosis not present

## 2022-11-29 DIAGNOSIS — Q761 Klippel-Feil syndrome: Secondary | ICD-10-CM | POA: Diagnosis not present

## 2022-11-29 DIAGNOSIS — G894 Chronic pain syndrome: Secondary | ICD-10-CM | POA: Diagnosis not present

## 2022-11-29 DIAGNOSIS — M5010 Cervical disc disorder with radiculopathy, unspecified cervical region: Secondary | ICD-10-CM | POA: Diagnosis not present

## 2022-11-29 NOTE — Progress Notes (Addendum)
PTNS  Session # 12  Health & Social Factors: no change Caffeine: 2 Alcohol: 0 Daytime voids #per day: 3 Night-time voids #per night: 2 Urgency: strong Incontinence Episodes #per day: 3-4 Ankle used: right Treatment Setting: 7 Feeling/ Response: sensory Comments: none  Performed By: Randa Lynn, RMA  Follow Up: Return in about 4 weeks (around 12/27/2022) for PTNS follow-up.

## 2022-11-29 NOTE — Patient Instructions (Signed)
Tracking Your Bladder Symptoms   Patient Name:___________________________________________________  Example: Day   Daytime Voids  Nighttime Voids Urgency for the Day (none, mild, strong, severe) Number of Accidents/ Leaks Beverage Comments  Monday IIII II Strong I Water IIII Coffee  I     Week Starting:____________________________________  Day Daytime  Voids Nighttime  Voids Urgency for the Day (none, mild, strong, severe) Number of Accidents/ Leaks Beverages Comments                                                           This week my symptoms were:  O much better O better O the same O worse   

## 2022-11-30 ENCOUNTER — Ambulatory Visit (INDEPENDENT_AMBULATORY_CARE_PROVIDER_SITE_OTHER): Payer: Medicare HMO | Admitting: Nurse Practitioner

## 2022-11-30 ENCOUNTER — Ambulatory Visit (INDEPENDENT_AMBULATORY_CARE_PROVIDER_SITE_OTHER)
Admission: RE | Admit: 2022-11-30 | Discharge: 2022-11-30 | Disposition: A | Discharge status: Home / Self Care | Payer: Medicare HMO | Source: Ambulatory Visit | Attending: Nurse Practitioner | Admitting: Nurse Practitioner

## 2022-11-30 ENCOUNTER — Encounter: Payer: Self-pay | Admitting: Nurse Practitioner

## 2022-11-30 VITALS — BP 124/62 | HR 75 | Temp 97.7°F | Resp 16 | Ht 65.0 in | Wt 139.0 lb

## 2022-11-30 DIAGNOSIS — S92352A Displaced fracture of fifth metatarsal bone, left foot, initial encounter for closed fracture: Secondary | ICD-10-CM

## 2022-11-30 DIAGNOSIS — M79672 Pain in left foot: Secondary | ICD-10-CM

## 2022-11-30 DIAGNOSIS — M25572 Pain in left ankle and joints of left foot: Secondary | ICD-10-CM | POA: Diagnosis not present

## 2022-11-30 DIAGNOSIS — J9611 Chronic respiratory failure with hypoxia: Secondary | ICD-10-CM | POA: Diagnosis not present

## 2022-11-30 DIAGNOSIS — G479 Sleep disorder, unspecified: Secondary | ICD-10-CM

## 2022-11-30 MED ORDER — TRAMADOL HCL 50 MG PO TABS
50.0000 mg | ORAL_TABLET | Freq: Three times a day (TID) | ORAL | 0 refills | Status: AC | PRN
Start: 2022-11-30 — End: 2022-12-05

## 2022-11-30 MED ORDER — HYDROXYZINE PAMOATE 25 MG PO CAPS
25.0000 mg | ORAL_CAPSULE | Freq: Every evening | ORAL | 0 refills | Status: DC | PRN
Start: 2022-11-30 — End: 2023-01-11

## 2022-11-30 NOTE — Patient Instructions (Signed)
Nice to see you today I have increased the dose of the hydroxyzine to 25mg  at bedtime I will be in touch with the xrays once I have reviewed them  Follow up in 1 month for your physical and labs

## 2022-11-30 NOTE — Progress Notes (Signed)
Established Patient Office Visit  Subjective   Patient ID: Maria [name], female    DOB: Oct 15, 1955  Age: 67 y.o. MRN: [id number]  Chief Complaint  Patient presents with   Sleeping Problem    Follow up    Medication Consultation   Ankle Pain      Sleep disturbance: patient was seen on 09/01/2022 and placed on hydroxyzine for sleep.  Patient is on chronic oxygen followed by emphysema due to alpha-1-atitrypsin. States that it was helping I n the beginning and was able to get 4 hours of continous sleep when she took it. States that it was working for a couple of weeks then became ineffective. States that she did not have any lightheadednss or dizziness.  Ankle pain: states that she was helping her husband to redo there well house couver. States he was trying to get the 2x4 off and it flew off and hit her ankle. Left ankle. This happened Monday 2 days ago. States that it hurts with sitting still and walking States that it is keeping her up at night. States that it feels like it is on fire. States that it is puffier than her other ankle. States that there was not a lot of bruising. States that she is having some numbness that is intermittently. States that she has tried aleve, tylenol, and ace wrap. That did not help     Review of Systems  Constitutional:  Negative for chills and fever.  Respiratory:  Positive for shortness of breath.   Cardiovascular:  Negative for chest pain.  Musculoskeletal:  Positive for joint pain.  Neurological:  Positive for tingling and weakness.      Objective:     BP 124/62   Pulse 75   Temp 97.7 F (36.5 C)   Resp 16   Ht 5\' 5"  (1.651 m)   Wt 139 lb (63 kg)   SpO2 96%   BMI 23.13 kg/m    Physical Exam Vitals and nursing note reviewed.  Constitutional:      Appearance: Normal appearance.  Cardiovascular:     Rate and Rhythm: Normal rate and regular rhythm.     Pulses:          Dorsalis pedis pulses are 2+ on the left side.     Heart  sounds: Normal heart sounds.  Pulmonary:     Effort: Pulmonary effort is normal.     Comments: Decreased globally. On chronic O2 Musculoskeletal:        General: Tenderness present.     Left lower leg: Edema present.       Legs:     Comments: Edema noted No bruising Cap refill less than 2  Neurological:     Mental Status: She is alert.      No results found for any visits on 11/30/22.    The 10-year ASCVD risk score (Arnett DK, et al., 2019) is: 4.5%    Assessment & Plan:   Problem List Items Addressed This Visit       Respiratory   Chronic respiratory failure with hypoxia (HCC) - Primary    Patient currently followed by pulmonology on chronic oxygen        Musculoskeletal and Integument   Closed displaced fracture of fifth metatarsal bone of left foot    Patient reinjured her left foot did do picture of ankle and foot noticed fracture of fifth left metatarsal patient was placed in a postop shoe pending radiology reading  Relevant Medications   traMADol (ULTRAM) 50 MG tablet   Other Relevant Orders   Post-op shoe     Other   Sleep disturbance    Did try patient on hydroxyzine 10 mg states it worked well for a while and then stopped working will increase to hydroxyzine 25 mg nightly as needed.  Sedation precautions reviewed along with lightheadedness and dizziness when she gets up to go to the bathroom at night      Relevant Medications   hydrOXYzine (VISTARIL) 25 MG capsule   Left foot pain    Was helping her husband when a 2 x 4 piece of wood hit her ankle and foot.  Pending x-ray      Relevant Orders   DG Foot Complete Left (Completed)   Acute left ankle pain    Was helping her husband when a 2 x 4 piece of wood hit her ankle and foot.  Pending x-ray      Relevant Orders   DG Ankle Complete Left (Completed)    Return in about 4 weeks (around 12/28/2022) for CPE and Labs.    Audria Nine, NP

## 2022-11-30 NOTE — Assessment & Plan Note (Signed)
Patient reinjured her left foot did do picture of ankle and foot noticed fracture of fifth left metatarsal patient was placed in a postop shoe pending radiology reading

## 2022-11-30 NOTE — Assessment & Plan Note (Signed)
Patient currently followed by pulmonology on chronic oxygen

## 2022-11-30 NOTE — Assessment & Plan Note (Signed)
Was helping her husband when a 2 x 4 piece of wood hit her ankle and foot.  Pending x-ray

## 2022-11-30 NOTE — Assessment & Plan Note (Signed)
Was helping her husband when a 2 x 4 piece of wood hit her ankle and foot.  Pending x-ray 

## 2022-11-30 NOTE — Assessment & Plan Note (Signed)
Did try patient on hydroxyzine 10 mg states it worked well for a while and then stopped working will increase to hydroxyzine 25 mg nightly as needed.  Sedation precautions reviewed along with lightheadedness and dizziness when she gets up to go to the bathroom at night

## 2022-12-05 ENCOUNTER — Ambulatory Visit: Payer: Medicare HMO | Admitting: Physician Assistant

## 2022-12-07 ENCOUNTER — Encounter: Payer: Medicare HMO | Admitting: *Deleted

## 2022-12-07 DIAGNOSIS — J441 Chronic obstructive pulmonary disease with (acute) exacerbation: Secondary | ICD-10-CM

## 2022-12-07 DIAGNOSIS — Z006 Encounter for examination for normal comparison and control in clinical research program: Secondary | ICD-10-CM

## 2022-12-07 MED ORDER — STUDY - ARNASA GB44332 - ASTEGOLIMAB 238 MG/1.7 ML OR PLACEBO SQ INJECTION (PI-RAMASWAMY)
476.0000 mg | INJECTION | SUBCUTANEOUS | Status: DC
  Administered 2022-12-07: 476 mg via SUBCUTANEOUS
  Filled 2022-12-07: qty 3.4

## 2022-12-07 NOTE — Research (Signed)
Title: A Phase III, randomized, double-blind, placebo controlled, multicenter study to evaluate the efficacy and safety of astegolimab in patients with chronic obstructive pulmonary disease    Dose and Duration of Treatment: Astegolimab is presented as a sterile, slightly brown-yellow solution. Each single-use, 2.25 mL pre-filled syringe contains 1.7 mL deliverable volume. Astegolimab drug product is formulated at 140 mg/mL astegolimab with 114 mM succinic acid, 200 mM L-arginine, 10 mM L-methionine, 0.06% (w/v) polysorbate 20, pH 5.7. Placebo for astegolimab is supplied in an identical pre-filled syringe configuration.   Protocol # [id number]   Sponsor: [employer] [address], [place]  Protocol: Version 3, dated 26/May2023, Version 4 dated 20Jun2023 IB: version 8 dated April 2023 ICF: Main version 12Apr2023, revised 17July2023 [facility] 01Sep2023, revised 01Sep2023 Lab Manual: V4.0.0 20Oct2023   Mechanism of action: Astegolimab (also known as [id number] or [id number]) is a fully human, IgG2 monoclonal antibody that binds with high affinity to the interleukin (IL)-33 (IL-33) receptor, ST2, thereby blocking the signaling of IL-33, an inflammatory cytokine of the IL-1 family and member of the "alarmin" class of molecules. Astegolimab binds with high affinity to the human and cynomolgus monkey receptor for IL-33, ST2, and blocks IL-33 binding, thus inhibiting association with the IL-1R accessory protein (AcP) co-receptor and formation of an activated receptor complex.      Integrated Pharmacokinetic/Pharmacodynamic Analysis:  In Studies [id number], [id number], and [id number], exploratory biomarker analysis showed there were consistent decreases in blood eosinophil counts throughout the treatment period, potentially mediated by a direct effect of IL-33 on eosinophil progenitors. In [place], there was no significant difference in fractional exhaled nitric oxide (FeNO)  levels (reflecting airway IL-4/IL-13 activity) between astegolimab-treated groups relative to placebo throughout the treatment period. These data suggest that astegolimab has only a limited effect on Type 2 inflammation in asthma. No data are applicable for Study [id number]. No data are available yet for Study [id number].  Special Warnings/Considerations: Administration of astegolimab, a protein therapeutic, may lead to the development of anti-astegolimab antibodies which could lead to AEs and/or decreased exposure. In non-clinical studies (see Section 4.2), ADA incidence has generally been low and there was no apparent ADA impact on PK and safety in these studies. To date, the immunogenicity rates observed with astegolimab in clinical studies have been relatively low as well (see Immunogenicity Section 5.6). Several clinical studies have been conducted in patients with asthma, atopic dermatitis, COPD (IIS Study [id number]) and COVID-19 severe pneumonia, which generally showed low incidence of ADAs (Table 31). There has been no correlation between ADA status and clinical findings or increased incidence of AEs. Astegolimab is now being considered in a larger study for the treatment of COPD. This patient population is typically considered to have a hyper-responsive immune system. Route of administration for this molecule will be [place], either Q2W or Q4W. These factors increase the risk of development of an immune response to astegolimab, specifically with repeat dosing. From the previous IIS Study [id number], incidence of ADAs was low in COPD patients; this remains to be confirmed in a larger study. To monitor ADA development in ongoing studies, serum samples will be collected from patients at protocol-defined intervals. Patients who test positive for antibodies and have clinical sequelae that are considered potentially related to an ADA response may also be asked to return for additional follow-up testing.  Drug Interaction  Studies: No PK drug interaction studies have been conducted to date.  Serious Adverse Reactions Observed in Asthma Study: During the treatment period, a  similar proportion of patients across all cohorts experienced at least one AE, regardless of causality (Table 22). In total, 77.2%, 70.9%, 72.2%, and 72.1% of patients reported at least 1 AE in the placebo, 70 mg, 210 mg, and 490 mg groups, respectively. The most common AE's (>5% in any treatment group) were asthma, nasopharyngitis, upper respiratory tract infection, headache, and injection site reaction (ISR). The most common drug-related AE was ISR, which was reported more frequently in the astegolimab treatment groups than in the placebo group (1 [0.8%] patient in the placebo group, 10 [7.9%] patients with 70 mg, 8 [6.3%] patients with 210 mg, 6 [4.9%] patients with 490 mg). All ISRs were non-serious and mild or moderate in severity. During the treatment period, 50 SAEs were reported in 37 (7.4%) patients. The number of patients reporting SAEs was comparable across all cohorts (11 SAEs in 8 patients on placebo, 21 SAEs in 14 patients on 70 mg, 11 SAEs in 9 patients on 210 mg, and 7 SAEs in 6 patients on 490 mg). The most common SAE was asthma. One SAE of moderate livedo reticularis (70 mg) was considered a suspected unexpected serious adverse drug reaction (SUSAR) related to astegolimab and was reported two days after the second dose, leading to discontinuation of astegolimab. Two (0.4%) patients reported anaphylaxis and hypersensitivity reactions: 1 severe SAE of anaphylactic reaction (placebo), and 1 moderate hypersensitivity (490 mg) considered related to astegolimab. Three (0.6%) patients experienced a potential Major Adverse Cardiac Event (MACE) (1 patient each in the placebo, 70 mg, and 210 mg groups). None of the potential MACE were considered related to astegolimab. Overall, 233 (46.4%) patients reported events of infection. A comparable number of  patients reported infection across all treatment groups (65 [51.2%] patients on placebo, 55 [43.3%] patients on 70 mg, 58 [46.0%] patients on 210 mg, and 55 [45.1%] patients on 490 mg). The most frequently reported infection (?10% incidence) was nasopharyngitis (12.7%). One patient on astegolimab 210 mg and the partner of one patient on placebo became pregnant during the study. Both delivered normal/healthy babies. Two deaths, unrelated to study drug, were reported: one patient on 210 mg astegolimab died following an SAE of asthma; the other patient on 490 mg astegolimab had an unexplained death. There were no clinically meaningful changes in laboratory parameters, vital signs, or ECG results, other than the 10% decrease in mean blood eosinophil counts in the astegolimab-treated groups, with no safety concerns. Treatment-induced ADAs were comparable between the astegolimab groups and had no impact on safety. Overall, astegolimab was well tolerated at all doses used and had a safety profile consistent with that observed in the previous astegolimab Phase I studies.  Serious Adverse Reactions Observed in Previous COPD Study: In the completed IIS Study [id number], a total of 81 patients received at least one dose of astegolimab or placebo. The safety profile of astegolimab was similar to that of placebo. There were a total of 222 AEs reported in 62 patients. A total of 28 (72%) patients in the placebo arm and 34 (81%) patients in the astegolimab arm reported at least one AE. The most commonly reported AEs were headache followed by urinary tract infection and viral upper respiratory tract infection. A total of 39 SAEs were reported in 28 (35%) patients; 16 (41%) patients in the placebo group and 12 (29%) in the astegolimab group. The most commonly reported SAE was hospital admission for community acquired pneumonia. Four SAEs resulted in patient discontinuation from treatment (1 patient in the placebo group and 3  patients in the astegolimab group). One patient in the placebo group experienced an AESI of potential MACE (heart failure, unrelated to the study treatment). No anaphylaxis or pregnancies were reported. Two deaths, unrelated to study treatment, were reported: 1 patient in the placebo group died after hospital acquired pneumonia, and another patient in the placebo group died after pneumonia and type 2 respiratory failure.  Safety Data: Astegolimab has been generally well tolerated. There have been 49 patient deaths across all astegolimab studies (Section 5.5.2), none of which were considered related to astegolimab. A total of 144 subjects experienced a total of 224 SAEs across all astegolimab studies. Of these, an SAE livedo reticularis observed in 1 patient was considered related to astegolimab by the investigator (Section 5.5.3). AEs leading to withdrawal (Section 5.5.4) have generally occurred at a rate similar to what would be expected for clinical trials in the studies' respective indications.   [facility] [place] Clinical Research Coordinator note :    This visit for Maria [name] with DOB: 10/23/1955 on 22/11/2022 for the above protocol is Visit/Encounter #[place] 7/week 10  and is for purpose of research.    The consent for this encounter is under Protocol Version 2.0 , Investigator Brochure Version 7, Consent Version IRB Approved  revised  and is currently IRB approved.    Maria expressed continued interest and consent in continuing as a study Maria. Maria confirmed that there was no change in contact information (e.g. address, telephone, email). Maria thanked for participation in research and contribution to science.  In this visit  the Maria will be evaluated by Kalman Shan, MDThis research coordinator has verified that the above investigator is up to date with his/her training logs.    The Maria was informed that the PI Dr. Marchelle Gearing continues to have oversight of the  Maria's visits and course  through relevant discussions, reviews and also specifically of this visit by routing of this note to the PI. [facility] [place] Clinical Research Coordinator note:   This visit for Maria Maria [name] with DOB: September 09, 1955 on 12/07/2022 for the above protocol is Visit/Encounter # 7  and is for purpose of research.    Maria expressed continued interest and consent in continuing as a study Maria. Maria confirmed that there was  no   change in contact information (e.g. address, telephone, email). Maria thanked for participation in research and contribution to science. In this visit 12/07/2022 the Maria will be evaluated by [facility] Principal Investigator . This research coordinator has verified that the above investigator is yes up to date with his/her training logs.   The Maria was yes  informed that the PI Murali Ramaswamy,MD continues to have oversight of the Maria's visits and course through relevant discussions, reviews, and also specifically of this visit by routing of this note to the PI. New adverse event per patient, ankle bruising caused by accident with a wood plank hitting the area.  Pt was seen by pcp, xray taken no fractures noted.  No limitations or medications dispensed in relation to the adverse event. All procedures and assessments were completed per above stated protocol.  Maria tolerated injections well.  Refer to subjects paper source binder for further details of this visit.  Signed by  Jerolyn Shin [facility]  Clinical Research Coordinator [facility]  [place], [place] 11:44 AM 12/07/2022

## 2022-12-08 MED ORDER — STUDY - ARNASA GB44332 - ASTEGOLIMAB 238 MG/1.7 ML OR PLACEBO SQ INJECTION (PI-RAMASWAMY)
476.0000 mg | INJECTION | SUBCUTANEOUS | Status: DC
Start: 2022-12-21 — End: 2022-12-21

## 2022-12-13 ENCOUNTER — Other Ambulatory Visit: Payer: Self-pay | Admitting: Internal Medicine

## 2022-12-14 ENCOUNTER — Ambulatory Visit
Admission: RE | Admit: 2022-12-14 | Discharge: 2022-12-14 | Disposition: A | Discharge status: Home / Self Care | Payer: Medicare HMO | Source: Ambulatory Visit | Attending: Student in an Organized Health Care Education/Training Program | Admitting: Student in an Organized Health Care Education/Training Program

## 2022-12-14 ENCOUNTER — Encounter: Payer: Self-pay | Admitting: Student in an Organized Health Care Education/Training Program

## 2022-12-14 ENCOUNTER — Ambulatory Visit
Payer: Medicare HMO | Attending: Student in an Organized Health Care Education/Training Program | Admitting: Student in an Organized Health Care Education/Training Program

## 2022-12-14 DIAGNOSIS — Q761 Klippel-Feil syndrome: Secondary | ICD-10-CM | POA: Diagnosis not present

## 2022-12-14 DIAGNOSIS — G894 Chronic pain syndrome: Secondary | ICD-10-CM | POA: Diagnosis not present

## 2022-12-14 DIAGNOSIS — M5010 Cervical disc disorder with radiculopathy, unspecified cervical region: Secondary | ICD-10-CM | POA: Insufficient documentation

## 2022-12-14 MED ORDER — IOHEXOL 180 MG/ML  SOLN
10.0000 mL | Freq: Once | INTRAMUSCULAR | Status: AC
  Administered 2022-12-14: 10 mL via EPIDURAL
  Filled 2022-12-14: qty 20

## 2022-12-14 MED ORDER — ROPIVACAINE HCL 2 MG/ML IJ SOLN
1.0000 mL | Freq: Once | INTRAMUSCULAR | Status: AC
  Administered 2022-12-14: 1 mL via EPIDURAL
  Filled 2022-12-14: qty 20

## 2022-12-14 MED ORDER — DIAZEPAM 5 MG PO TABS
5.0000 mg | ORAL_TABLET | ORAL | Status: AC
  Administered 2022-12-14: 5 mg via ORAL

## 2022-12-14 MED ORDER — DIAZEPAM 5 MG PO TABS
ORAL_TABLET | ORAL | Status: AC
  Filled 2022-12-14: qty 1

## 2022-12-14 MED ORDER — SODIUM CHLORIDE 0.9% FLUSH
1.0000 mL | Freq: Once | INTRAVENOUS | Status: AC
  Administered 2022-12-14: 1 mL

## 2022-12-14 MED ORDER — DEXAMETHASONE SODIUM PHOSPHATE 10 MG/ML IJ SOLN
10.0000 mg | Freq: Once | INTRAMUSCULAR | Status: AC
  Administered 2022-12-14: 10 mg
  Filled 2022-12-14: qty 1

## 2022-12-14 MED ORDER — LIDOCAINE HCL 2 % IJ SOLN
20.0000 mL | Freq: Once | INTRAMUSCULAR | Status: AC
  Administered 2022-12-14: 100 mg

## 2022-12-14 NOTE — Progress Notes (Signed)
PROVIDER NOTE: Interpretation of information contained herein should be left to medically-trained personnel. Specific patient instructions are provided elsewhere under "Patient Instructions" section of medical record. This document was created in part using STT-dictation technology, any transcriptional errors that may result from this process are unintentional.  Patient: Maria [name] Type: Established DOB: [birth year] MRN: [id number] PCP: Eden Emms, NP  Service: Procedure DOS: 12/14/2022 Setting: Ambulatory Location: Ambulatory outpatient facility Delivery: Face-to-face Provider: Edward Jolly, MD Specialty: Interventional Pain Management Specialty designation: 09 Location: Outpatient facility Ref. Prov.: Edward Jolly, MD       Interventional Therapy   Procedure: Cervical Epidural Steroid injection (CESI) (Interlaminar) #1  Laterality: Right  Level: C7-T1 Imaging: Fluoroscopy-assisted DOS: 12/14/2022  Performed by: Edward Jolly, MD Anesthesia: Local anesthesia (1-2% Lidocaine) Anxiolysis: Valium 5 mg PO  Purpose: Diagnostic/Therapeutic Indications: Cervicalgia, cervical radicular pain, degenerative disc disease, severe enough to impact quality of life or function. 1. Cervical fusion syndrome (C6-7)   2. Cervical disc disorder with radiculopathy of cervical region (right)   3. Chronic pain syndrome    NAS-11 score:   Pre-procedure: 8 /10   Post-procedure: 6 /10      Position  Prep  Materials:  Location setting: Procedure suite Position: Prone, on modified reverse trendelenburg to facilitate breathing, with head in head-cradle. Pillows positioned under chest (below chin-level) with cervical spine flexed. Safety Precautions: Patient was assessed for positional comfort and pressure points before starting the procedure. Prepping solution: DuraPrep (Iodine Povacrylex [0.7% available iodine] and Isopropyl Alcohol, 74% w/w) Prep Area: Entire  cervicothoracic region Approach:  percutaneous, paramedial Intended target: Posterior cervical epidural space Materials Procedure:  Tray: Epidural Needle(s): Epidural (Tuohy) Qty: 1 Length: (90mm) 3.5-inch Gauge: 22G   Pre-op H&P Assessment:  Maria [name] is a 67 y.o. (year old), female patient, seen today for interventional treatment. She  has a past surgical history that includes Abdominal hysterectomy (1978); Ovarian cyst removal (1988); Neck surgery (1999); Spine surgery (Neck 2002); Cholecystectomy (2010); Colonoscopy; Colonoscopy; Esophagogastroduodenoscopy; and Colonoscopy with propofol (N/A, 11/01/2021). Maria [name] has a current medication list which includes the following prescription(s): albuterol, aspirin low dose, atorvastatin, azelastine hcl, benzonatate, breztri aerosphere, vitamin d-3, escitalopram, gabapentin, hydroxyzine, ipratropium-albuterol, OVER THE COUNTER MEDICATION, oxybutynin, oxygen-helium, pantoprazole, potassium, flutter, roflumilast, and sodium chloride hypertonic, and the following Facility-Administered Medications: [START ON 12/21/2022] arnasa [id number] astegolimab or placebo. Her primarily concern today is the Neck Pain (Right)  Initial Vital Signs:  Pulse/HCG Rate: 66ECG Heart Rate: 64 Temp: 98.1 F (36.7 C) Resp: 18 BP: 124/66 SpO2: 100 % (4L O2)  BMI: Estimated body mass index is 23.13 kg/m as calculated from the following:   Height as of this encounter: 5\' 5"  (1.651 m).   Weight as of this encounter: 139 lb (63 kg).  Risk Assessment: Allergies: Reviewed. She is allergic to montelukast.  Allergy Precautions: None required Coagulopathies: Reviewed. None identified.  Blood-thinner therapy: None at this time Active Infection(s): Reviewed. None identified. Maria [name] is afebrile  Site Confirmation: Maria [name] was asked to confirm the procedure and laterality before marking the site Procedure checklist: Completed Consent: Before the procedure and under the influence of no sedative(s), amnesic(s), or  anxiolytics, the patient was informed of the treatment options, risks and possible complications. To fulfill our ethical and legal obligations, as recommended by the American Medical Association's Code of Ethics, I have informed the patient of my clinical impression; the nature and purpose of the treatment or procedure; the risks, benefits, and possible complications of the intervention; the  alternatives, including doing nothing; the risk(s) and benefit(s) of the alternative treatment(s) or procedure(s); and the risk(s) and benefit(s) of doing nothing. The patient was provided information about the general risks and possible complications associated with the procedure. These may include, but are not limited to: failure to achieve desired goals, infection, bleeding, organ or nerve damage, allergic reactions, paralysis, and death. In addition, the patient was informed of those risks and complications associated to Spine-related procedures, such as failure to decrease pain; infection (i.e.: Meningitis, epidural or intraspinal abscess); bleeding (i.e.: epidural hematoma, subarachnoid hemorrhage, or any other type of intraspinal or peri-dural bleeding); organ or nerve damage (i.e.: Any type of peripheral nerve, nerve root, or spinal cord injury) with subsequent damage to sensory, motor, and/or autonomic systems, resulting in permanent pain, numbness, and/or weakness of one or several areas of the body; allergic reactions; (i.e.: anaphylactic reaction); and/or death. Furthermore, the patient was informed of those risks and complications associated with the medications. These include, but are not limited to: allergic reactions (i.e.: anaphylactic or anaphylactoid reaction(s)); adrenal axis suppression; blood sugar elevation that in diabetics may result in ketoacidosis or comma; water retention that in patients with history of congestive heart failure may result in shortness of breath, pulmonary edema, and decompensation  with resultant heart failure; weight gain; swelling or edema; medication-induced neural toxicity; particulate matter embolism and blood vessel occlusion with resultant organ, and/or nervous system infarction; and/or aseptic necrosis of one or more joints. Finally, the patient was informed that Medicine is not an exact science; therefore, there is also the possibility of unforeseen or unpredictable risks and/or possible complications that may result in a catastrophic outcome. The patient indicated having understood very clearly. We have given the patient no guarantees and we have made no promises. Enough time was given to the patient to ask questions, all of which were answered to the patient's satisfaction. Maria [name] has indicated that she wanted to continue with the procedure. Attestation: I, the ordering provider, attest that I have discussed with the patient the benefits, risks, side-effects, alternatives, likelihood of achieving goals, and potential problems during recovery for the procedure that I have provided informed consent. Date  Time: 12/14/2022 12:41 PM   Pre-Procedure Preparation:  Monitoring: As per clinic protocol. Respiration, ETCO2, SpO2, BP, heart rate and rhythm monitor placed and checked for adequate function Safety Precautions: Patient was assessed for positional comfort and pressure points before starting the procedure. Time-out: I initiated and conducted the "Time-out" before starting the procedure, as per protocol. The patient was asked to participate by confirming the accuracy of the "Time Out" information. Verification of the correct person, site, and procedure were performed and confirmed by me, the nursing staff, and the patient. "Time-out" conducted as per Joint Commission's Universal Protocol (UP.01.01.01). Time: 1325 Start Time: 1325 hrs.  Description  Narrative of Procedure:          Rationale (medical necessity): procedure needed and proper for the diagnosis and/or  treatment of the patient's medical symptoms and needs. Start Time: 1325 hrs. Safety Precautions: Aspiration looking for blood return was conducted prior to all injections. At no point did we inject any substances, as a needle was being advanced. No attempts were made at seeking any paresthesias. Safe injection practices and needle disposal techniques used. Medications properly checked for expiration dates. SDV (single dose vial) medications used. Description of procedure: Protocol guidelines were followed. The patient was assisted into a comfortable position. The target area was identified and the area prepped in  the usual manner. Skin & deeper tissues infiltrated with local anesthetic. Appropriate amount of time allowed to pass for local anesthetics to take effect. Using fluoroscopic guidance, the epidural needle was introduced through the skin, ipsilateral to the reported pain, and advanced to the target area. Posterior laminar os was contacted and the needle walked caudad, until the lamina was cleared. The ligamentum flavum was engaged and the epidural space identified using "loss-of-resistance technique" with 2-3 ml of PF-NaCl (0.9% NSS), in a 5cc dedicated LOR syringe. (See "Imaging guidance" below for use of contrast details.) Once proper needle placement was secured, and negative aspiration confirmed, the solution was injected in intermittent fashion, asking for systemic symptoms every 0.5cc. The needles were then removed and the area cleansed, making sure to leave some of the prepping solution back to take advantage of its long term bactericidal properties.  3 cc solution made of 1 cc of preservative-free saline, 1 cc of 0.2% ropivacaine, 1 cc of Decadron 10 mg/cc.   Vitals:   12/14/22 1256 12/14/22 1324  BP: 124/66 121/65  Pulse: 66   Resp: 18 16  Temp: 98.1 F (36.7 C)   TempSrc: Temporal   SpO2: 100% 100%  Weight: 139 lb (63 kg)   Height: 5\' 5"  (1.651 m)      End Time: 1328  hrs.  Imaging Guidance (Spinal):          Type of Imaging Technique: Fluoroscopy Guidance (Spinal) Indication(s): Assistance in needle guidance and placement for procedures requiring needle placement in or near specific anatomical locations not easily accessible without such assistance. Exposure Time: Please see nurses notes. Contrast: Before injecting any contrast, we confirmed that the patient did not have an allergy to iodine, shellfish, or radiological contrast. Once satisfactory needle placement was completed at the desired level, radiological contrast was injected. Contrast injected under live fluoroscopy. No contrast complications. See chart for type and volume of contrast used. Fluoroscopic Guidance: I was personally present during the use of fluoroscopy. "Tunnel Vision Technique" used to obtain the best possible view of the target area. Parallax error corrected before commencing the procedure. "Direction-depth-direction" technique used to introduce the needle under continuous pulsed fluoroscopy. Once target was reached, antero-posterior, oblique, and lateral fluoroscopic projection used confirm needle placement in all planes. Images permanently stored in EMR. Interpretation: I personally interpreted the imaging intraoperatively. Adequate needle placement confirmed in multiple planes. Appropriate spread of contrast into desired area was observed. No evidence of afferent or efferent intravascular uptake. No intrathecal or subarachnoid spread observed. Permanent images saved into the patient's record.  Post-operative Assessment:  Post-procedure Vital Signs:  Pulse/HCG Rate: 6664 Temp: 98.1 F (36.7 C) Resp: 16 BP: 121/65 SpO2: 100 %  EBL: None  Complications: No immediate post-treatment complications observed by team, or reported by patient.  Note: The patient tolerated the entire procedure well. A repeat set of vitals were taken after the procedure and the patient was kept under  observation following institutional policy, for this type of procedure. Post-procedural neurological assessment was performed, showing return to baseline, prior to discharge. The patient was provided with post-procedure discharge instructions, including a section on how to identify potential problems. Should any problems arise concerning this procedure, the patient was given instructions to immediately contact us, at any time, without hesitation. In any case, we plan to contact the patient by telephone for a follow-up status report regarding this interventional procedure.  Comments:  No additional relevant information.  Plan of Care (POC)  Orders:  Orders Placed  This Encounter  Procedures   DG PAIN CLINIC C-ARM 1-60 MIN NO REPORT    Intraoperative interpretation by procedural physician at [facility].    Standing Status:   Standing    Number of Occurrences:   1    Order Specific Question:   Reason for exam:    Answer:   Assistance in needle guidance and placement for procedures requiring needle placement in or near specific anatomical locations not easily accessible without such assistance.     Medications ordered for procedure: Meds ordered this encounter  Medications   iohexol (OMNIPAQUE) 180 MG/ML injection 10 mL    Must be Myelogram-compatible. If not available, you may substitute with a water-soluble, non-ionic, hypoallergenic, myelogram-compatible radiological contrast medium.   lidocaine (XYLOCAINE) 2 % (with pres) injection 400 mg   ropivacaine (PF) 2 mg/mL (0.2%) (NAROPIN) injection 1 mL   sodium chloride flush (NS) 0.9 % injection 1 mL   dexamethasone (DECADRON) injection 10 mg   diazepam (VALIUM) tablet 5 mg    Make sure Flumazenil is available in the pyxis when using this medication. If oversedation occurs, administer 0.2 mg IV over 15 sec. If after 45 sec no response, administer 0.2 mg again over 1 min; may repeat at 1 min intervals; not to exceed 4 doses (1 mg)    Medications administered: We administered iohexol, lidocaine, ropivacaine (PF) 2 mg/mL (0.2%), sodium chloride flush, dexamethasone, and diazepam.  See the medical record for exact dosing, route, and time of administration.  Follow-up plan:   Return in about 4 weeks (around 01/11/2023) for Post Procedure Evaluation, virtual.       Future interventional options: -Right cervical ESI 12/14/22 -Cervical facet medial branch nerve blocks -Glenohumeral joint injection -Suprascapular nerve block  Also consider trial of Lyrica     Recent Visits Date Type Provider Dept  11/03/22 Office Visit Edward Jolly, MD [facility]  Showing recent visits within past 90 days and meeting all other requirements Today's Visits Date Type Provider Dept  12/14/22 Procedure visit Edward Jolly, MD [facility]  Showing today's visits and meeting all other requirements Future Appointments Date Type Provider Dept  01/11/23 Appointment Edward Jolly, MD [facility]  Showing future appointments within next 90 days and meeting all other requirements  Disposition: Discharge home  Discharge (Date  Time): 12/14/2022; 1351 hrs.   Primary Care Physician: Eden Emms, NP Location: [facility] Note by: Edward Jolly, MD (TTS technology used. I apologize for any typographical errors that were not detected and corrected.) Date: 12/14/2022; Time: 2:14 PM  Disclaimer:  Medicine is not an Visual merchandiser. The only guarantee in medicine is that nothing is guaranteed. It is important to note that the decision to proceed with this intervention was based on the information collected from the patient. The Data and conclusions were drawn from the patient's questionnaire, the interview, and the physical examination. Because the information was provided in large part by the patient, it cannot be guaranteed that it has not been purposely or unconsciously manipulated. Every  effort has been made to obtain as much relevant data as possible for this evaluation. It is important to note that the conclusions that lead to this procedure are derived in large part from the available data. Always take into account that the treatment will also be dependent on availability of resources and existing treatment guidelines, considered by other Pain Management Practitioners as being common knowledge and practice, at the time of the  intervention. For Medico-Legal purposes, it is also important to point out that variation in procedural techniques and pharmacological choices are the acceptable norm. The indications, contraindications, technique, and results of the above procedure should only be interpreted and judged by a Board-Certified Interventional Pain Specialist with extensive familiarity and expertise in the same exact procedure and technique.

## 2022-12-14 NOTE — Patient Instructions (Addendum)
____________________________________________________________________________________________  Post-Procedure Discharge Instructions  Instructions: Apply ice:  Purpose: This will minimize any swelling and discomfort after procedure.  When: Day of procedure, as soon as you get home. How: Fill a plastic sandwich bag with crushed ice. Cover it with a small towel and apply to injection site. How long: (15 min on, 15 min off) Apply for 15 minutes then remove x 15 minutes.  Repeat sequence on day of procedure, until you go to bed. Apply heat:  Purpose: To treat any soreness and discomfort from the procedure. When: Starting the next day after the procedure. How: Apply heat to procedure site starting the day following the procedure. How long: May continue to repeat daily, until discomfort goes away. Food intake: Start with clear liquids (like water) and advance to regular food, as tolerated.  Physical activities: Keep activities to a minimum for the first 8 hours after the procedure. After that, then as tolerated. Driving: If you have received any sedation, be responsible and do not drive. You are not allowed to drive for 24 hours after having sedation. Blood thinner: (Applies only to those taking blood thinners) You may restart your blood thinner 6 hours after your procedure. Insulin: (Applies only to Diabetic patients taking insulin) As soon as you can eat, you may resume your normal dosing schedule. Infection prevention: Keep procedure site clean and dry. Shower daily and clean area with soap and water. Post-procedure Pain Diary: Extremely important that this be done correctly and accurately. Recorded information will be used to determine the next step in treatment. For the purpose of accuracy, follow these rules: Evaluate only the area treated. Do not report or include pain from an untreated area. For the purpose of this evaluation, ignore all other areas of pain, except for the treated  area. After your procedure, avoid taking a long nap and attempting to complete the pain diary after you wake up. Instead, set your alarm clock to go off every hour, on the hour, for the initial 8 hours after the procedure. Document the duration of the numbing medicine, and the relief you are getting from it. Do not go to sleep and attempt to complete it later. It will not be accurate. If you received sedation, it is likely that you were given a medication that may cause amnesia. Because of this, completing the diary at a later time may cause the information to be inaccurate. This information is needed to plan your care. Follow-up appointment: Keep your post-procedure follow-up evaluation appointment after the procedure (usually 2 weeks for most procedures, 6 weeks for radiofrequencies). DO NOT FORGET to bring you pain diary with you.   Expect: (What should I expect to see with my procedure?) From numbing medicine (AKA: Local Anesthetics): Numbness or decrease in pain. You may also experience some weakness, which if present, could last for the duration of the local anesthetic. Onset: Full effect within 15 minutes of injected. Duration: It will depend on the type of local anesthetic used. On the average, 1 to 8 hours.  From steroids (Applies only if steroids were used): Decrease in swelling or inflammation. Once inflammation is improved, relief of the pain will follow. Onset of benefits: Depends on the amount of swelling present. The more swelling, the longer it will take for the benefits to be seen. In some cases, up to 10 days. Duration: Steroids will stay in the system x 2 weeks. Duration of benefits will depend on multiple posibilities including persistent irritating factors. Side-effects: If present, they  may typically last 2 weeks (the duration of the steroids). Frequent: Cramps (if they occur, drink Gatorade and take over-the-counter Magnesium 450-500 mg once to twice a day); water retention with  temporary weight gain; increases in blood sugar; decreased immune system response; increased appetite. Occasional: Facial flushing (red, warm cheeks); mood swings; menstrual changes. Uncommon: Long-term decrease or suppression of natural hormones; bone thinning. (These are more common with higher doses or more frequent use. This is why we prefer that our patients avoid having any injection therapies in other practices.)  Very Rare: Severe mood changes; psychosis; aseptic necrosis. From procedure: Some discomfort is to be expected once the numbing medicine wears off. This should be minimal if ice and heat are applied as instructed.  Call if: (When should I call?) You experience numbness and weakness that gets worse with time, as opposed to wearing off. New onset bowel or bladder incontinence. (Applies only to procedures done in the spine)  Emergency Numbers: Durning business hours (Monday - Thursday, 8:00 AM - 4:00 PM) (Friday, 9:00 AM - 12:00 Noon): [phone] After hours: [phone] NOTE: If you are having a problem and are unable connect with, or to talk to a provider, then go to your nearest urgent care or emergency department. If the problem is serious and urgent, please call 911. ____________________________________________________________________________________________   Epidural Steroid Injection Patient Information  Description: The epidural space surrounds the nerves as they exit the spinal cord.  In some patients, the nerves can be compressed and inflamed by a bulging disc or a tight spinal canal (spinal stenosis).  By injecting steroids into the epidural space, we can bring irritated nerves into direct contact with a potentially helpful medication.  These steroids act directly on the irritated nerves and can reduce swelling and inflammation which often leads to decreased pain.  Epidural steroids may be injected anywhere along the spine and from the neck to the low back  depending upon the location of your pain.   After numbing the skin with local anesthetic (like Novocaine), a small needle is passed into the epidural space slowly.  You may experience a sensation of pressure while this is being done.  The entire block usually last less than 10 minutes.  Conditions which may be treated by epidural steroids:  Low back and leg pain Neck and arm pain Spinal stenosis Post-laminectomy syndrome Herpes zoster (shingles) pain Pain from compression fractures  Preparation for the injection:  Do not eat any solid food or dairy products within 8 hours of your appointment.  You may drink clear liquids up to 3 hours before appointment.  Clear liquids include water, black coffee, juice or soda.  No milk or cream please. You may take your regular medication, including pain medications, with a sip of water before your appointment  Diabetics should hold regular insulin (if taken separately) and take 1/2 normal NPH dos the morning of the procedure.  Carry some sugar containing items with you to your appointment. A driver must accompany you and be prepared to drive you home after your procedure.  Bring all your current medications with your. An IV may be inserted and sedation may be given at the discretion of the physician.   A blood pressure cuff, EKG and other monitors will often be applied during the procedure.  Some patients may need to have extra oxygen administered for a short period. You will be asked to provide medical information, including your allergies, prior to the procedure.  We must know  immediately if you are taking blood thinners (like Coumadin/Warfarin)  Or if you are allergic to IV iodine contrast (dye). We must know if you could possible be pregnant.  Possible side-effects: Bleeding from needle site Infection (rare, may require surgery) Nerve injury (rare) Numbness & tingling (temporary) Difficulty urinating (rare, temporary) Spinal headache ( a headache  worse with upright posture) Light -headedness (temporary) Pain at injection site (several days) Decreased blood pressure (temporary) Weakness in arm/leg (temporary) Pressure sensation in back/neck (temporary)  Call if you experience: Fever/chills associated with headache or increased back/neck pain. Headache worsened by an upright position. New onset weakness or numbness of an extremity below the injection site Hives or difficulty breathing (go to the emergency room) Inflammation or drainage at the infection site Severe back/neck pain Any new symptoms which are concerning to you  Please note:  Although the local anesthetic injected can often make your back or neck feel good for several hours after the injection, the pain will likely return.  It takes 3-7 days for steroids to work in the epidural space.  You may not notice any pain relief for at least that one week.  If effective, we will often do a series of three injections spaced 3-6 weeks apart to maximally decrease your pain.  After the initial series, we generally will wait several months before considering a repeat injection of the same type.  If you have any questions, please call [phone] [facility]  An epidural steroid injection is a shot of steroid medicine, also called cortisone, and a numbing medicine that is given into the epidural space. This space is between the spinal cord and the bones of the back. This shot helps relieve pain caused by an irritated or swollen nerve root. The pain relief you get from the injection depends on the cause of your condition and how long your pain lasts. You may have a period of slightly more pain after your injection, before the steroid medicine takes effect. This medicine usually starts working within 1-3 days. In some cases, you might need 7-10 days to feel the full effect. Tell your health care provider about: Any allergies  you have. All medicines you are taking, including vitamins, herbs, eye drops, creams, and over-the-counter medicines. Any problems you or family members have had with anesthesia. Any bleeding problems you have. Any surgeries you have had. Any medical conditions you have. Whether you are pregnant or may be pregnant. What are the risks? Your health care provider will talk with you about risks. These may include: Headache. Bleeding. Infection. Allergic reaction to medicines or dyes. Nerve damage. Not being able to move (paralysis). This is rare. What happens before the procedure? Medicines You may be given medicines to lower anxiety. Ask your provider about: Changing or stopping your regular medicines. These include any diabetes medicines or blood thinners you take. Taking medicines such as aspirin and ibuprofen. These medicines can thin your blood. Do not take them unless your provider tells you to. Taking over-the-counter medicines, vitamins, herbs, and supplements. General instructions Follow instructions from your provider about what you may eat and drink. Ask your provider what steps will be taken to help prevent infection. If you will be going home right after the procedure, plan to have a responsible adult: Take you home from the hospital or clinic. You will not be allowed to drive. Care for you for the time you are told. What happens during the procedure?  An  IV will be inserted into one of your veins. You may be given a sedative to help you relax. You will be asked to sit or lie on your side. The injection site will be cleaned. An X-ray machine will be used to guide the needle close to the nerve that is causing pain. A needle will be put through your skin into the epidural space. This may cause you some discomfort. Contrast dye may be injected at the site to make sure that the steroid medicine will be sent to the exact place it needs to go. The steroid medicine and a  numbing medicine will be injected into the epidural space for pain relief. The needle will be removed. A bandage (dressing) will be put over the injection site. The procedure may vary among providers and hospitals. What happens after the procedure? Your blood pressure, heart rate, breathing rate, and blood oxygen level will be monitored until you leave the hospital or clinic. Your IV will be removed. Your arm or leg may feel weak or numb for a few hours. This information is not intended to replace advice given to you by your health care provider. Make sure you discuss any questions you have with your health care provider. Document Revised: 02/11/2022 Document Reviewed: 02/11/2022 Elsevier Patient Education  2024 [employer].

## 2022-12-14 NOTE — Progress Notes (Signed)
1338 received to PACU per orders. H2O given. Post op instructions given with understanding. Ice pack given.  Dr Cherylann Ratel in to see patient.No headaches

## 2022-12-14 NOTE — Progress Notes (Signed)
Safety precautions to be maintained throughout the outpatient stay will include: orient to surroundings, keep bed in low position, maintain call bell within reach at all times, provide assistance with transfer out of bed and ambulation.  

## 2022-12-15 ENCOUNTER — Telehealth: Payer: Self-pay

## 2022-12-15 NOTE — Telephone Encounter (Signed)
Called PP. Voicemail left to call if needed.

## 2022-12-21 ENCOUNTER — Encounter: Payer: Medicare HMO | Admitting: Internal Medicine

## 2022-12-21 ENCOUNTER — Encounter: Payer: Medicare HMO | Admitting: *Deleted

## 2022-12-21 ENCOUNTER — Encounter (INDEPENDENT_AMBULATORY_CARE_PROVIDER_SITE_OTHER): Payer: Medicare HMO | Admitting: Adult Health

## 2022-12-21 DIAGNOSIS — Z006 Encounter for examination for normal comparison and control in clinical research program: Secondary | ICD-10-CM

## 2022-12-21 DIAGNOSIS — J441 Chronic obstructive pulmonary disease with (acute) exacerbation: Secondary | ICD-10-CM

## 2022-12-21 MED ORDER — STUDY - ARNASA GB44332 - ASTEGOLIMAB 238 MG/1.7 ML OR PLACEBO SQ INJECTION (PI-RAMASWAMY)
476.0000 mg | INJECTION | SUBCUTANEOUS | Status: DC
  Administered 2022-12-21: 476 mg via SUBCUTANEOUS
  Filled 2022-12-21: qty 3.4

## 2022-12-21 NOTE — Research (Signed)
Title: A Phase III, randomized, double-blind, placebo controlled, multicenter study to evaluate the efficacy and safety of astegolimab in patients with chronic obstructive pulmonary disease    Dose and Duration of Treatment: Astegolimab is presented as a sterile, slightly brown-yellow solution. Each single-use, 2.25 mL pre-filled syringe contains 1.7 mL deliverable volume. Astegolimab drug product is formulated at 140 mg/mL astegolimab with 114 mM succinic acid, 200 mM L-arginine, 10 mM L-methionine, 0.06% (w/v) polysorbate 20, pH 5.7. Placebo for astegolimab is supplied in an identical pre-filled syringe configuration.   Designer, fashion/clothing Product: Astegolimab [phone])  Protocol: Version 3, dated 26/May2023, Version 4 dated 20Jun2023 IB: version 8 dated April 2023 ICF: Main version 12Apr2023, revised 17July2023 [employer] [facility] 01Sep2023, revised 01Sep2023 Lab Manual: V4.0.0 20Oct2023  Mechanism of action: Astegolimab (also known as [id number] or [id number]) is a fully human, IgG2 monoclonal antibody that binds with high affinity to the interleukin (IL)-33 (IL-33) receptor, ST2, thereby blocking the signaling of IL-33, an inflammatory cytokine of the IL-1 family and member of the "alarmin" class of molecules. Astegolimab binds with high affinity to the human and cynomolgus monkey receptor for IL-33, ST2, and blocks IL-33 binding, thus inhibiting association with the IL-1R accessory protein (AcP) co-receptor and formation of an activated receptor complex.    Integrated Pharmacokinetic/Pharmacodynamic Analysis:  In Studies [id number], [id number], and [id number], exploratory biomarker analysis showed there were consistent decreases in blood eosinophil counts throughout the treatment period, potentially mediated by a direct effect of IL-33 on eosinophil progenitors. In [place], there was no significant difference in fractional exhaled nitric oxide (FeNO) levels (reflecting airway IL-4/IL-13 activity)  between astegolimab-treated groups relative to placebo throughout the treatment period. These data suggest that astegolimab has only a limited effect on Type 2 inflammation in asthma. No data are applicable for Study [id number]. No data are available yet for Study [id number].  Special Warnings/Considerations: Administration of astegolimab, a protein therapeutic, may lead to the development of anti-astegolimab antibodies which could lead to AEs and/or decreased exposure. In non-clinical studies (see Section 4.2), ADA incidence has generally been low and there was no apparent ADA impact on PK and safety in these studies. To date, the immunogenicity rates observed with astegolimab in clinical studies have been relatively low as well (see Immunogenicity Section 5.6). Several clinical studies have been conducted in patients with asthma, atopic dermatitis, COPD (IIS Study EP32951) and COVID-19 severe pneumonia, which generally showed low incidence of ADAs (Table 31). There has been no correlation between ADA status and clinical findings or increased incidence of AEs. Astegolimab is now being considered in a larger study for the treatment of COPD. This patient population is typically considered to have a hyper-responsive immune system. Route of administration for this molecule will be [place], either Q2W or Q4W. These factors increase the risk of development of an immune response to astegolimab, specifically with repeat dosing. From the previous IIS Study [id number], incidence of ADAs was low in COPD patients; this remains to be confirmed in a larger study. To monitor ADA development in ongoing studies, serum samples will be collected from patients at protocol-defined intervals. Patients who test positive for antibodies and have clinical sequelae that are considered potentially related to an ADA response may also be asked to return for additional follow-up testing.  Drug Interaction Studies: No PK drug interaction studies have been  conducted to date.  Serious Adverse Reactions Observed in Asthma Study: During the treatment period, a similar proportion of patients across all cohorts experienced at least one AE, regardless of  causality (Table 22). In total, 77.2%, 70.9%, 72.2%, and 72.1% of patients reported at least 1 AE in the placebo, 70 mg, 210 mg, and 490 mg groups, respectively. The most common AE's (>5% in any treatment group) were asthma, nasopharyngitis, upper respiratory tract infection, headache, and injection site reaction (ISR). The most common drug-related AE was ISR, which was reported more frequently in the astegolimab treatment groups than in the placebo group (1 [0.8%] patient in the placebo group, 10 [7.9%] patients with 70 mg, 8 [6.3%] patients with 210 mg, 6 [4.9%] patients with 490 mg). All ISRs were non-serious and mild or moderate in severity. During the treatment period, 50 SAEs were reported in 37 (7.4%) patients. The number of patients reporting SAEs was comparable across all cohorts (11 SAEs in 8 patients on placebo, 21 SAEs in 14 patients on 70 mg, 11 SAEs in 9 patients on 210 mg, and 7 SAEs in 6 patients on 490 mg). The most common SAE was asthma. One SAE of moderate livedo reticularis (70 mg) was considered a suspected unexpected serious adverse drug reaction (SUSAR) related to astegolimab and was reported two days after the second dose, leading to discontinuation of astegolimab. Two (0.4%) patients reported anaphylaxis and hypersensitivity reactions: 1 severe SAE of anaphylactic reaction (placebo), and 1 moderate hypersensitivity (490 mg) considered related to astegolimab. Three (0.6%) patients experienced a potential Major Adverse Cardiac Event (MACE) (1 patient each in the placebo, 70 mg, and 210 mg groups). None of the potential MACE were considered related to astegolimab. Overall, 233 (46.4%) patients reported events of infection. A comparable number of patients reported infection across all treatment groups  (65 [51.2%] patients on placebo, 55 [43.3%] patients on 70 mg, 58 [46.0%] patients on 210 mg, and 55 [45.1%] patients on 490 mg). The most frequently reported infection (?10% incidence) was nasopharyngitis (12.7%). One patient on astegolimab 210 mg and the partner of one patient on placebo became pregnant during the study. Both delivered normal/healthy babies. Two deaths, unrelated to study drug, were reported: one patient on 210 mg astegolimab died following an SAE of asthma; the other patient on 490 mg astegolimab had an unexplained death. There were no clinically meaningful changes in laboratory parameters, vital signs, or ECG results, other than the 10% decrease in mean blood eosinophil counts in the astegolimab-treated groups, with no safety concerns. Treatment-induced ADAs were comparable between the astegolimab groups and had no impact on safety. Overall, astegolimab was well tolerated at all doses used and had a safety profile consistent with that observed in the previous astegolimab Phase I studies.  Serious Adverse Reactions Observed in Previous COPD Study: In the completed IIS Study [id number], a total of 81 patients received at least one dose of astegolimab or placebo. The safety profile of astegolimab was similar to that of placebo. There were a total of 222 AEs reported in 62 patients. A total of 28 (72%) patients in the placebo arm and 34 (81%) patients in the astegolimab arm reported at least one AE. The most commonly reported AEs were headache followed by urinary tract infection and viral upper respiratory tract infection. A total of 39 SAEs were reported in 28 (35%) patients; 16 (41%) patients in the placebo group and 12 (29%) in the astegolimab group. The most commonly reported SAE was hospital admission for community acquired pneumonia. Four SAEs resulted in patient discontinuation from treatment (1 patient in the placebo group and 3 patients in the astegolimab group). One patient in the placebo  group experienced an  AESI of potential MACE (heart failure, unrelated to the study treatment). No anaphylaxis or pregnancies were reported. Two deaths, unrelated to study treatment, were reported: 1 patient in the placebo group died after hospital acquired pneumonia, and another patient in the placebo group died after pneumonia and type 2 respiratory failure.  Safety Data: Astegolimab has been generally well tolerated. There have been 49 patient deaths across all astegolimab studies (Section 5.5.2), none of which were considered related to astegolimab. A total of 144 subjects experienced a total of 224 SAEs across all astegolimab studies. Of these, an SAE livedo reticularis observed in 1 patient was considered related to astegolimab by the investigator (Section 5.5.3). AEs leading to withdrawal (Section 5.5.4) have generally occurred at a rate similar to what would be expected for clinical trials in the studies' respective indications.   [facility] [place] Clinical Research Coordinator note :    The consent for this encounter is under Protocol Version 2.0 , Investigator Brochure Version 7, Consent Version IRB Approved  revised  and is currently IRB approved.    Subject expressed continued interest and consent in continuing as a study subject. Subject confirmed that there was no change in contact information (e.g. address, telephone, email). Subject thanked for participation in research and contribution to science.  In this visit Visit 8 week 12 the subject will be evaluated by Laqueisha Catalina Parrett,NP This research coordinator has verified that the above investigator is up to date with his/her training logs.    The Subject was informed that the PI Dr. Marchelle Gearing continues to have oversight of the subject's visits and course  through relevant discussions, reviews and also specifically of this visit by routing of this note to the PI.  [facility] [place] Clinical Research Coordinator note:   This visit for  Subject Maria [name] with DOB: 12/06/1955 on 12/21/2022 for the above protocol is Visit/Encounter # 8 week 12  and is for purpose of research.   Subject expressed continued interest and consent in continuing as a study subject. Subject confirmed that there was  no  change in contact information (e.g. address, telephone, email). Subject thanked for participation in research and contribution to science. In this visit 12/21/2022 the subject will be evaluated by Sub-Investigator) named Rubye Oaks, NP. This research coordinator has verified that the above investigator is yes up to date with his/her training logs.   The Subject was yes informed that the PI Murali Ramaswamy,MD continues to have oversight of the subject's visits and course through relevant discussions, reviews, and also specifically of this visit by routing of this note to the PI.    Signed by  Jerolyn Shin, [facility]  Clinical Research Coordinator  [facility]  [place], [place] 11:52 AM 12/21/2022

## 2022-12-23 DIAGNOSIS — J9611 Chronic respiratory failure with hypoxia: Secondary | ICD-10-CM | POA: Diagnosis not present

## 2022-12-23 DIAGNOSIS — J449 Chronic obstructive pulmonary disease, unspecified: Secondary | ICD-10-CM | POA: Diagnosis not present

## 2022-12-25 ENCOUNTER — Other Ambulatory Visit: Payer: Self-pay | Admitting: Internal Medicine

## 2022-12-26 ENCOUNTER — Ambulatory Visit: Payer: Medicare HMO | Admitting: Physician Assistant

## 2022-12-26 VITALS — BP 121/73 | HR 63 | Ht 65.0 in | Wt 136.0 lb

## 2022-12-26 DIAGNOSIS — N3946 Mixed incontinence: Secondary | ICD-10-CM | POA: Diagnosis not present

## 2022-12-26 NOTE — Progress Notes (Signed)
12/26/2022 10:40 AM   Maria [name] 07-30-1955 [id number]  CC: Chief Complaint  Patient presents with   Follow-up    Post PTNS treatments   HPI: Maria [name] is a 67 y.o. female with PMH COPD and OAB wet with mixed urge and stress incontinence who failed Myrbetriq, now on oxybutynin, and who recently completed PTNS x 12 who presents today for PTNS follow-up.   Per review of weekly voiding diaries, her progress is as follows: Daytime voids 2-3 -> 3 Nocturia 1-2 -> 2 Urgency strong -> strong Incontinence episodes many -> 3-4  Today she reports she feels PTNS was helpful for her.  She has noticed fewer leaking events and more time to get to the bathroom when she does feel an urge.  She feels that she had the greatest symptomatic relief at the end of her initial 12 treatment series.  She would like to pursue maintenance PTNS, but hopes to defer this a couple of weeks due to cost concerns.  PMH: Past Medical History:  Diagnosis Date   Atherosclerosis 1/0612014   aorta, iliacs and CFA bilaterally no greater than 0-49% - lower arterial doppler.   Blood transfusion without reported diagnosis 2005   Cancer [facility]) Ovarian 1978   Chest pain    COPD (chronic obstructive pulmonary disease) (HCC)    COVID-19 long hauler manifesting chronic dyspnea    Emphysema of lung (HCC) 2019   Oxygen deficiency 03/2019   Oxygen dependent    3 liters up to 5    Surgical History: Past Surgical History:  Procedure Laterality Date   ABDOMINAL HYSTERECTOMY  1978   CHOLECYSTECTOMY  2010   COLONOSCOPY     COLONOSCOPY     COLONOSCOPY WITH PROPOFOL N/A 11/01/2021   Procedure: COLONOSCOPY WITH PROPOFOL;  Surgeon: Wyline Mood, MD;  Location: [facility] ENDOSCOPY;  Service: Gastroenterology;  Laterality: N/A;   ESOPHAGOGASTRODUODENOSCOPY     NECK SURGERY  1999   OVARIAN CYST REMOVAL  1988   SPINE SURGERY  Neck 2002    Home Medications:  Allergies as of 12/26/2022       Reactions   Montelukast    Bad  dreams        Medication List        Accurate as of December 26, 2022 10:40 AM. If you have any questions, ask your nurse or doctor.          albuterol 108 (90 Base) MCG/ACT inhaler Commonly known as: VENTOLIN HFA TAKE 2 PUFFS BY MOUTH EVERY 6 HOURS AS NEEDED FOR WHEEZE OR SHORTNESS OF BREATH   Aspirin Low Dose 81 MG tablet Generic drug: aspirin EC TAKE 1 TABLET BY MOUTH EVERY DAY   atorvastatin 10 MG tablet Commonly known as: LIPITOR TAKE 1 TABLET BY MOUTH EVERY DAY   Azelastine HCl 137 MCG/SPRAY Soln USE 1 SPRAY INTO EACH NOSTRIL TWICE A DAY   benzonatate 200 MG capsule Commonly known as: TESSALON Take 1 capsule by mouth three times daily as needed   Breztri Aerosphere 160-9-4.8 MCG/ACT Aero Generic drug: Budeson-Glycopyrrol-Formoterol Inhale 2 puffs into the lungs in the morning and at bedtime.   escitalopram 20 MG tablet Commonly known as: Lexapro Take 1 tablet (20 mg total) by mouth daily.   Flutter Devi Use as directed   gabapentin 600 MG tablet Commonly known as: Neurontin Take 1 tablet (600 mg total) by mouth every 8 (eight) hours.   hydrOXYzine 25 MG capsule Commonly known as: VISTARIL Take 1 capsule (25 mg  total) by mouth at bedtime as needed.   ipratropium-albuterol 0.5-2.5 (3) MG/3ML Soln Commonly known as: DUONEB USE 1 AMPULE IN NEBULIZER 4 TIMES A DAY AS NEEDED (DX: J84.112, J44.9)   OVER THE COUNTER MEDICATION Take 600 mg by mouth daily at 6 (six) AM. N-Acetyl-l-Cysteine tablet   oxybutynin 10 MG 24 hr tablet Commonly known as: DITROPAN-XL Take 1 tablet (10 mg total) by mouth daily.   OXYGEN Inhale into the lungs. 3 liters at rest and 5 liters with any activity.-   pantoprazole 40 MG tablet Commonly known as: PROTONIX Take 1 tablet by mouth once daily   Potassium 99 MG Tabs Take 99 mg by mouth daily.   roflumilast 500 MCG Tabs tablet Commonly known as: DALIRESP Take 1 tablet by mouth once daily   sodium chloride HYPERTONIC 3 %  nebulizer solution Take by nebulization 2 (two) times daily.   Vitamin D-3 125 MCG (5000 UT) Tabs Take 5,000 Units by mouth daily.        Allergies:  Allergies  Allergen Reactions   Montelukast     Bad dreams    Family History: Family History  Problem Relation Age of Onset   Heart failure Mother    Diabetes Mother    Kidney disease Mother    Hypertension Mother    Arthritis Mother    Heart disease Mother    Obesity Mother    COPD Father    Hearing loss Father    Lung cancer Brother    COPD Brother    COPD Brother    Asthma Son    Diabetes Son    Birth defects Son    Breast cancer Maternal Aunt 40    Social History:   reports that she quit smoking about 6 years ago. Her smoking use included cigarettes. She has a 45.00 pack-year smoking history. She has been exposed to tobacco smoke. She has never used smokeless tobacco. She reports that she does not drink alcohol and does not use drugs.  Physical Exam: BP (!) 148/73   Pulse 70   Ht 5\' 5"  (1.651 m)   Wt 136 lb (61.7 kg)   BMI 22.63 kg/m   Constitutional:  Alert and oriented, no acute distress, nontoxic appearing HEENT: [place], AT Cardiovascular: No clubbing, cyanosis, or edema Respiratory: On supplemental oxygen Skin: No rashes, bruises or suspicious lesions Neurologic: Grossly intact, no focal deficits, moving all 4 extremities Psychiatric: Normal mood and affect  Assessment & Plan:   1. Mixed incontinence Symptomatic improvement after completing PTNS x 12.  Will start maintenance PTNS in early July per patient request.  Return in about 3 weeks (around 01/16/2023) for PTNS maintenance.  Carman Ching, PA-C  [facility] [place] [address] [place], [place] [address] [phone]

## 2022-12-30 DIAGNOSIS — M5010 Cervical disc disorder with radiculopathy, unspecified cervical region: Secondary | ICD-10-CM | POA: Diagnosis not present

## 2022-12-30 DIAGNOSIS — M25511 Pain in right shoulder: Secondary | ICD-10-CM | POA: Diagnosis not present

## 2022-12-30 DIAGNOSIS — Q761 Klippel-Feil syndrome: Secondary | ICD-10-CM | POA: Diagnosis not present

## 2022-12-30 DIAGNOSIS — S92352S Displaced fracture of fifth metatarsal bone, left foot, sequela: Secondary | ICD-10-CM | POA: Diagnosis not present

## 2022-12-30 DIAGNOSIS — G894 Chronic pain syndrome: Secondary | ICD-10-CM | POA: Diagnosis not present

## 2023-01-03 DIAGNOSIS — Z006 Encounter for examination for normal comparison and control in clinical research program: Secondary | ICD-10-CM | POA: Insufficient documentation

## 2023-01-03 NOTE — Assessment & Plan Note (Signed)
Continue protocol . Exam completed per protocol

## 2023-01-03 NOTE — Progress Notes (Signed)
@Patient  ID: Maria [name], female    DOB: January 23, 1956, 67 y.o.   MRN: [id number]     Referring provider: Eden Emms, NP  HPI: 67 year old female former smoker followed for COPD with emphysema and chronic respiratory failure   Title: A Phase III, randomized, double-blind, placebo controlled, multicenter study to evaluate the efficacy and safety of astegolimab in patients with chronic obstructive pulmonary disease    Dose and Duration of Treatment: Astegolimab is presented as a sterile, slightly brown-yellow solution. Each single-use, 2.25 mL pre-filled syringe contains 1.7 mL deliverable volume. Astegolimab drug product is formulated at 140 mg/mL astegolimab with 114 mM succinic acid, 200 mM L-arginine, 10 mM L-methionine, 0.06% (w/v) polysorbate 20, pH 5.7. Placebo for astegolimab is supplied in an identical pre-filled syringe configuration.   Protocol # [id number]   Sponsor: [employer] [address] [place]      Personnel officer: Astegolimab [id number])   Mechanism of action: Astegolimab (also known as [id number] or [id number]) is a fully human, IgG2 monoclonal antibody that binds with high affinity to the interleukin (IL)-33 (IL-33) receptor, ST2, thereby blocking the signaling of IL-33, an inflammatory cytokine of the IL-1 family and member of the "alarmin" class of molecules. Astegolimab binds with high affinity to the human and cynomolgus monkey receptor for IL-33, ST2, and blocks IL-33 binding, thus inhibiting association with the IL-1R accessory protein (AcP) co-receptor and formation of an activated receptor complex.   Key Inclusion Criteria: Age 2-80 years at Visit 1   Documented COPD diagnosis for ?12 months prior to Visit 1 History of frequent exacerbations, defined as having had 2 or more moderate or severe COPD exacerbations within 12 months prior to Visit 1 Post-bronchodilator FEV1 ?20% and <80% of predicted at Visit 1  or Visit 2  Post-bronchodilator FEV1/FVC <0.70 at Visit 1 or Visit 2  mMRC score ?2 at screening   Current tobacco smoker or former smoker (having stopped smoking for at least 6 months prior to Visit 1) with a history of smoking ?10 pack-years On optimized COPD maintenance therapy as defined below for ?12 months prior to Visit 1            -Inhaled corticosteroid (ICS) plus long-acting beta-agonist (LABA)           -Long-acting muscarinic antagonist (LAMA) plus LABA           -ICS plus LAMA plus LABA  Key Exclusion Criteria: Current documented diagnosis of asthma  Diagnosis of a-1 antitrypsin deficiency  History of long-term treatment with oxygen at >4.0 liters/minute  Any infection that resulted in hospital admission for ?24 hours and/or treatment with oral, IV, or IM antibiotics within 4 weeks prior to Visit 1 or during screening Upper or lower respiratory tract infection within 4 weeks prior to Visit 1 or during screening Treatment with oral, IV, or IM corticosteroids (>10 mg/day prednisolone or equivalent) within 4 weeks prior to initiation of study drug Treatment with a licensed biologic agent (e.g., omalizumab, dupilumab, and/or anti-IL-5 therapies) within 3 months or 5 drug-elimination half-lives (whichever is longer) prior to screening  Planned surgical intervention during the study Known immunodeficiency, including but not limited to, HIV infection  AST, ALT, or total bilirubin elevation ?2.0 x the upper limit of normal (ULN) during screening  History of malignancy within 5 years prior to screening, with the exception of malignancies with a negligible risk of metastasis or death (e.g., 5-year overall survival rate >90%), such as adequately treated  carcinoma in situ of the cervix, non-melanoma carcinoma, localized prostate cancer, or ductal carcinoma in situ Unstable cardiac disease, myocardial infarction, or New York Heart Association Class III or IV heart failure within 12 months prior  to screening History or absence of an abnormal ECG that is deemed clinically significant by the investigator, including complete left bundle branch block or second- or third-degree atrioventricular heart block     Integrated Pharmacokinetic/Pharmacodynamic Analysis:  In Studies ZO10960, AV40981, and [id number], exploratory biomarker analysis showed there were consistent decreases in blood eosinophil counts throughout the treatment period, potentially mediated by a direct effect of IL-33 on eosinophil progenitors. In [place], there was no significant difference in fractional exhaled nitric oxide (FeNO) levels (reflecting airway IL-4/IL-13 activity) between astegolimab-treated groups relative to placebo throughout the treatment period. These data suggest that astegolimab has only a limited effect on Type 2 inflammation in asthma. No data are applicable for Study NF62130. No data are available yet for Study [id number].  Special Warnings/Considerations: Administration of astegolimab, a protein therapeutic, may lead to the development of anti-astegolimab antibodies which could lead to AEs and/or decreased exposure. In non-clinical studies (see Section 4.2), ADA incidence has generally been low and there was no apparent ADA impact on PK and safety in these studies. To date, the immunogenicity rates observed with astegolimab in clinical studies have been relatively low as well (see Immunogenicity Section 5.6). Several clinical studies have been conducted in patients with asthma, atopic dermatitis, COPD (IIS Study NG29528) and COVID-19 severe pneumonia, which generally showed low incidence of ADAs (Table 31). There has been no correlation between ADA status and clinical findings or increased incidence of AEs. Astegolimab is now being considered in a larger study for the treatment of COPD. This patient population is typically considered to have a hyper-responsive immune system. Route of administration for this molecule  will be [place], either Q2W or Q4W. These factors increase the risk of development of an immune response to astegolimab, specifically with repeat dosing. From the previous IIS Study [id number], incidence of ADAs was low in COPD patients; this remains to be confirmed in a larger study. To monitor ADA development in ongoing studies, serum samples will be collected from patients at protocol-defined intervals. Patients who test positive for antibodies and have clinical sequelae that are considered potentially related to an ADA response may also be asked to return for additional follow-up testing.  Drug Interaction Studies: No PK drug interaction studies have been conducted to date.  Serious Adverse Reactions Observed in Asthma Study: During the treatment period, a similar proportion of patients across all cohorts experienced at least one AE, regardless of causality (Table 22). In total, 77.2%, 70.9%, 72.2%, and 72.1% of patients reported at least 1 AE in the placebo, 70 mg, 210 mg, and 490 mg groups, respectively. The most common AE's (>5% in any treatment group) were asthma, nasopharyngitis, upper respiratory tract infection, headache, and injection site reaction (ISR). The most common drug-related AE was ISR, which was reported more frequently in the astegolimab treatment groups than in the placebo group (1 [0.8%] patient in the placebo group, 10 [7.9%] patients with 70 mg, 8 [6.3%] patients with 210 mg, 6 [4.9%] patients with 490 mg). All ISRs were non-serious and mild or moderate in severity. During the treatment period, 50 SAEs were reported in 37 (7.4%) patients. The number of patients reporting SAEs was comparable across all cohorts (11 SAEs in 8 patients on placebo, 21 SAEs in 14 patients on 70 mg, 11 SAEs  in 9 patients on 210 mg, and 7 SAEs in 6 patients on 490 mg). The most common SAE was asthma. One SAE of moderate livedo reticularis (70 mg) was considered a suspected unexpected serious adverse drug reaction  (SUSAR) related to astegolimab and was reported two days after the second dose, leading to discontinuation of astegolimab. Two (0.4%) patients reported anaphylaxis and hypersensitivity reactions: 1 severe SAE of anaphylactic reaction (placebo), and 1 moderate hypersensitivity (490 mg) considered related to astegolimab. Three (0.6%) patients experienced a potential Major Adverse Cardiac Event (MACE) (1 patient each in the placebo, 70 mg, and 210 mg groups). None of the potential MACE were considered related to astegolimab. Overall, 233 (46.4%) patients reported events of infection. A comparable number of patients reported infection across all treatment groups (65 [51.2%] patients on placebo, 55 [43.3%] patients on 70 mg, 58 [46.0%] patients on 210 mg, and 55 [45.1%] patients on 490 mg). The most frequently reported infection (?10% incidence) was nasopharyngitis (12.7%). One patient on astegolimab 210 mg and the partner of one patient on placebo became pregnant during the study. Both delivered normal/healthy babies. Two deaths, unrelated to study drug, were reported: one patient on 210 mg astegolimab died following an SAE of asthma; the other patient on 490 mg astegolimab had an unexplained death. There were no clinically meaningful changes in laboratory parameters, vital signs, or ECG results, other than the 10% decrease in mean blood eosinophil counts in the astegolimab-treated groups, with no safety concerns. Treatment-induced ADAs were comparable between the astegolimab groups and had no impact on safety. Overall, astegolimab was well tolerated at all doses used and had a safety profile consistent with that observed in the previous astegolimab Phase I studies.  Serious Adverse Reactions Observed in Previous COPD Study: In the completed IIS Study [id number], a total of 81 patients received at least one dose of astegolimab or placebo. The safety profile of astegolimab was similar to that of placebo. There were a  total of 222 AEs reported in 62 patients. A total of 28 (72%) patients in the placebo arm and 34 (81%) patients in the astegolimab arm reported at least one AE. The most commonly reported AEs were headache followed by urinary tract infection and viral upper respiratory tract infection. A total of 39 SAEs were reported in 28 (35%) patients; 16 (41%) patients in the placebo group and 12 (29%) in the astegolimab group. The most commonly reported SAE was hospital admission for community acquired pneumonia. Four SAEs resulted in patient discontinuation from treatment (1 patient in the placebo group and 3 patients in the astegolimab group). One patient in the placebo group experienced an AESI of potential MACE (heart failure, unrelated to the study treatment). No anaphylaxis or pregnancies were reported. Two deaths, unrelated to study treatment, were reported: 1 patient in the placebo group died after hospital acquired pneumonia, and another patient in the placebo group died after pneumonia and type 2 respiratory failure.  Safety Data: Astegolimab has been generally well tolerated. There have been 49 patient deaths across all astegolimab studies (Section 5.5.2), none of which were considered related to astegolimab. A total of 144 subjects experienced a total of 224 SAEs across all astegolimab studies. Of these, an SAE livedo reticularis observed in 1 patient was considered related to astegolimab by the investigator (Section 5.5.3). AEs leading to withdrawal (Section 5.5.4) have generally occurred at a rate similar to what would be expected for clinical trials in the studies' respective indications.      12/21/22 Research  Visit  This visit for Subject Maria [name] with DOB: Jan 29, 1956 on 12/21/2022 for the above protocol is Visit/Encounter #  and is for purpose of Research . Subject/LAR expressed continued interest and consent in continuing as a study subject. Subject thanked for participation in research and  contribution to science.  Physical exam was completed per protocol .    Allergies  Allergen Reactions   Montelukast     Bad dreams    Immunization History  Administered Date(s) Administered   Fluad Quad(high Dose 65+) 03/30/2022   Influenza, High Dose Seasonal PF 03/30/2022   Influenza, Seasonal, Injecte, Preservative Fre 04/28/2008, 04/09/2010   Influenza,inj,Quad PF,6+ Mos 04/22/2019, 04/25/2020, 03/15/2021   Influenza-Unspecified 06/01/2008, 05/10/2016, 03/18/2017   Moderna Sars-Covid-2 Vaccination 07/30/2019, 08/28/2019, 06/27/2020   PNEUMOCOCCAL CONJUGATE-20 07/06/2022   Pfizer Covid-19 Vaccine Bivalent Booster 76yrs & up 07/28/2021   Pneumococcal Polysaccharide-23 04/15/2015, 04/22/2020   RSV,unspecified 07/06/2022   Respiratory Syncytial Virus Vaccine,Recomb Aduvanted(Arexvy) 05/30/2022   Td 10/01/2020   Tdap 11/17/2009   Zoster Recombinat (Shingrix) 04/16/2020, 06/22/2020    Past Medical History:  Diagnosis Date   Atherosclerosis 1/0612014   aorta, iliacs and CFA bilaterally no greater than 0-49% - lower arterial doppler.   Blood transfusion without reported diagnosis 2005   Cancer [facility]) Ovarian 1978   Chest pain    COPD (chronic obstructive pulmonary disease) (HCC)    COVID-19 long hauler manifesting chronic dyspnea    Emphysema of lung (HCC) 2019   Oxygen deficiency 03/2019   Oxygen dependent    3 liters up to 5    Tobacco History: Social History   Tobacco Use  Smoking Status Former   Packs/day: 1.00   Years: 45.00   Additional pack years: 0.00   Total pack years: 45.00   Types: Cigarettes   Quit date: 03/18/2016   Years since quitting: 6.8   Passive exposure: Past  Smokeless Tobacco Never   Counseling given: Not Answered   Outpatient Medications Prior to Visit  Medication Sig Dispense Refill   albuterol (VENTOLIN HFA) 108 (90 Base) MCG/ACT inhaler TAKE 2 PUFFS BY MOUTH EVERY 6 HOURS AS NEEDED FOR WHEEZE OR SHORTNESS OF BREATH 8.5 each 5    ASPIRIN LOW DOSE 81 MG EC tablet TAKE 1 TABLET BY MOUTH EVERY DAY 90 tablet 3   atorvastatin (LIPITOR) 10 MG tablet TAKE 1 TABLET BY MOUTH EVERY DAY 90 tablet 2   Azelastine HCl 137 MCG/SPRAY SOLN USE 1 SPRAY INTO EACH NOSTRIL TWICE A DAY 30 mL 1   benzonatate (TESSALON) 200 MG capsule Take 1 capsule by mouth three times daily as needed 60 capsule 0   Budeson-Glycopyrrol-Formoterol (BREZTRI AEROSPHERE) 160-9-4.8 MCG/ACT AERO Inhale 2 puffs into the lungs in the morning and at bedtime. 10.7 g 11   Cholecalciferol (VITAMIN D-3) 125 MCG (5000 UT) TABS Take 5,000 Units by mouth daily.     escitalopram (LEXAPRO) 20 MG tablet Take 1 tablet (20 mg total) by mouth daily. 90 tablet 1   gabapentin (NEURONTIN) 600 MG tablet Take 1 tablet (600 mg total) by mouth every 8 (eight) hours. 90 tablet 5   hydrOXYzine (VISTARIL) 25 MG capsule Take 1 capsule (25 mg total) by mouth at bedtime as needed. 30 capsule 0   ipratropium-albuterol (DUONEB) 0.5-2.5 (3) MG/3ML SOLN USE 1 AMPULE IN NEBULIZER 4 TIMES A DAY AS NEEDED (DX: J84.112, J44.9) 360 mL 5   OVER THE COUNTER MEDICATION Take 600 mg by mouth daily at 6 (six) AM. N-Acetyl-l-Cysteine tablet  oxybutynin (DITROPAN-XL) 10 MG 24 hr tablet Take 1 tablet (10 mg total) by mouth daily. 30 tablet 11   OXYGEN Inhale into the lungs. 3 liters at rest and 5 liters with any activity.-     pantoprazole (PROTONIX) 40 MG tablet Take 1 tablet by mouth once daily 30 tablet 3   Potassium 99 MG TABS Take 99 mg by mouth daily.     Respiratory Therapy Supplies (FLUTTER) DEVI Use as directed 1 each 0   sodium chloride HYPERTONIC 3 % nebulizer solution Take by nebulization 2 (two) times daily. 750 mL 12   Facility-Administered Medications Prior to Visit  Medication Dose Route Frequency Provider Last Rate Last Valerie Salts - ARNASA [id number] - astegolimab 238 mg/1.7 mL or placebo SQ injection (PI-Ramaswamy)  476 mg Subcutaneous Q14 Days Kalman Shan, MD   476 mg at 12/21/22 1035          Lab Results:  CBC         Latest Ref Rng & Units 08/29/2022    8:29 AM 11/12/2020   10:43 AM 01/16/2020   10:57 AM 06/24/2019    8:45 AM  PFT Results  FVC-Pre L 2.79  2.94  3.17  2.93   FVC-Predicted Pre % 83  84  90  83   FVC-Post L  3.18  3.25  3.39   FVC-Predicted Post %  91  92  96   Pre FEV1/FVC % % 57  54  55  56   Post FEV1/FCV % %  54  56  59   FEV1-Pre L 1.60  1.60  1.75  1.64   FEV1-Predicted Pre % 62  59  65  60   FEV1-Post L  1.71  1.83  1.99   DLCO uncorrected ml/min/mmHg 9.85  8.06  10.81  12.24   DLCO UNC% % 47  37  50  57   DLCO corrected ml/min/mmHg 9.85  8.06  11.29    DLCO COR %Predicted % 47  37  52    DLVA Predicted % 50  57  61  60   TLC L  4.89  5.25  5.81   TLC % Predicted %  91  98  108   RV % Predicted %  88  86  110     No results found for: "NITRICOXIDE"      Assessment & Plan:   Research study patient Continue protocol . Exam completed per protocol      Rubye Oaks, NP

## 2023-01-03 NOTE — Progress Notes (Signed)
Title: A Phase III, randomized, double-blind, placebo controlled, multicenter study to evaluate the efficacy and safety of astegolimab in patients with chronic obstructive pulmonary disease    Dose and Duration of Treatment: Astegolimab is presented as a sterile, slightly brown-yellow solution. Each single-use, 2.25 mL pre-filled syringe contains 1.7 mL deliverable volume. Astegolimab drug product is formulated at 140 mg/mL astegolimab with 114 mM succinic acid, 200 mM L-arginine, 10 mM L-methionine, 0.06% (w/v) polysorbate 20, pH 5.7. Placebo for astegolimab is supplied in an identical pre-filled syringe configuration.   Protocol # [id number]   Sponsor: F[employer] [address] [place]    Personnel officer: Astegolimab [phone])   Mechanism of action: Astegolimab (also known as [id number] or [id number]) is a fully human, IgG2 monoclonal antibody that binds with high affinity to the interleukin (IL)-33 (IL-33) receptor, ST2, thereby blocking the signaling of IL-33, an inflammatory cytokine of the IL-1 family and member of the "alarmin" class of molecules. Astegolimab binds with high affinity to the human and cynomolgus monkey receptor for IL-33, ST2, and blocks IL-33 binding, thus inhibiting association with the IL-1R accessory protein (AcP) co-receptor and formation of an activated receptor complex.   xxxxxxxxxxxxxxxxxxxxx   This visit for Subject Maria [name] with DOB: 1955-11-17 on 01/03/2023 for the above protocol is Visit/Encounter # research  and is for purpose of screening . Subject/LAR expressed interest and consent in bein a study subject. Subject thanked for participation in research and contribution to science.   Exam done per protocol and is in paper source  ACOPD COPD frequent flare ups Research  Plan   Per resarch protocol  Late entry  SIGNATURE    Dr. Kalman Shan, M.D., F.C.C.P, ACRP-CPI Pulmonary and Critical Care  Medicine Research Investigator, [facility] @ [facility] Health Staff Physician, [facility] Center Director - Interstitial Lung Disease  Program  Pulmonary Fibrosis [facility] [place] [facility] and [facility] @ [facility] [place], [place], [address]   Pager: [phone], If no answer  OR between  19:00-7:00h: page [phone] Telephone (research): [phone]  6:41 PM 01/03/2023   6:41 PM 01/03/2023

## 2023-01-03 NOTE — Addendum Note (Signed)
Addended by: Kalman Shan on: 01/03/2023 06:44 PM   Modules accepted: Level of Service

## 2023-01-03 NOTE — Patient Instructions (Addendum)
ICD-10-CM   1. Research exam  Z00.6     2. COPD, frequent exacerbations (HCC)  J44.1       Per protocol 

## 2023-01-04 ENCOUNTER — Encounter: Payer: Medicare HMO | Admitting: *Deleted

## 2023-01-04 DIAGNOSIS — J441 Chronic obstructive pulmonary disease with (acute) exacerbation: Secondary | ICD-10-CM

## 2023-01-04 DIAGNOSIS — Z006 Encounter for examination for normal comparison and control in clinical research program: Secondary | ICD-10-CM

## 2023-01-04 MED ORDER — STUDY - ARNASA GB44332 - ASTEGOLIMAB 238 MG/1.7 ML OR PLACEBO SQ INJECTION (PI-RAMASWAMY)
476.0000 mg | INJECTION | SUBCUTANEOUS | Status: DC
  Administered 2023-01-04: 476 mg via SUBCUTANEOUS
  Filled 2023-01-04: qty 3.4

## 2023-01-04 NOTE — Research (Signed)
Title: A Phase III, randomized, double-blind, placebo controlled, multicenter study to evaluate the efficacy and safety of astegolimab in patients with chronic obstructive pulmonary disease    Dose and Duration of Treatment: Astegolimab is presented as a sterile, slightly brown-yellow solution. Each single-use, 2.25 mL pre-filled syringe contains 1.7 mL deliverable volume. Astegolimab drug product is formulated at 140 mg/mL astegolimab with 114 mM succinic acid, 200 mM L-arginine, 10 mM L-methionine, 0.06% (w/v) polysorbate 20, pH 5.7. Placebo for astegolimab is supplied in an identical pre-filled syringe configuration.   Protocol # [id number]   Protocol: Version 3, dated 26/May2023, Version 4 dated 20Jun2023 IB: version 9.0 dated April 2024 ICF: Main version 12Apr2023, revised 17July2023 Mobile Nursing 01Sep2023, revised 01Sep2023 Lab Manual: V4.0.0 20Oct2023  Mechanism of action: Astegolimab (also known as [id number] or [id number]) is a fully human, IgG2 monoclonal antibody that binds with high affinity to the interleukin (IL)-33 (IL-33) receptor, ST2, thereby blocking the signaling of IL-33, an inflammatory cytokine of the IL-1 family and member of the "alarmin" class of molecules. Astegolimab binds with high affinity to the human and cynomolgus monkey receptor for IL-33, ST2, and blocks IL-33 binding, thus inhibiting association with the IL-1R accessory protein (AcP) co-receptor and formation of an activated receptor complex.   Key Inclusion Criteria: Age 26-80 years at Visit 1   Documented COPD diagnosis for ?12 months prior to Visit 1 History of frequent exacerbations, defined as having had 2 or more moderate or severe COPD exacerbations within 12 months prior to Visit 1 Post-bronchodilator FEV1 ?20% and <80% of predicted at Visit 1 or Visit 2  Post-bronchodilator FEV1/FVC <0.70 at Visit 1 or Visit 2  mMRC score ?2 at screening   Current tobacco smoker or former smoker (having stopped smoking  for at least 6 months prior to Visit 1) with a history of smoking ?10 pack-years On optimized COPD maintenance therapy as defined below for ?12 months prior to Visit 1            -Inhaled corticosteroid (ICS) plus long-acting beta-agonist (LABA)           -Long-acting muscarinic antagonist (LAMA) plus LABA           -ICS plus LAMA plus LABA  Key Exclusion Criteria: Current documented diagnosis of asthma  Diagnosis of a-1 antitrypsin deficiency  History of long-term treatment with oxygen at >4.0 liters/minute  Any infection that resulted in hospital admission for ?24 hours and/or treatment with oral, IV, or IM antibiotics within 4 weeks prior to Visit 1 or during screening Upper or lower respiratory tract infection within 4 weeks prior to Visit 1 or during screening Treatment with oral, IV, or IM corticosteroids (>10 mg/day prednisolone or equivalent) within 4 weeks prior to initiation of study drug Treatment with a licensed biologic agent (e.g., omalizumab, dupilumab, and/or anti-IL-5 therapies) within 3 months or 5 drug-elimination half-lives (whichever is longer) prior to screening  Planned surgical intervention during the study Known immunodeficiency, including but not limited to, HIV infection  AST, ALT, or total bilirubin elevation ?2.0 x the upper limit of normal (ULN) during screening  History of malignancy within 5 years prior to screening, with the exception of malignancies with a negligible risk of metastasis or death (e.g., 5-year overall survival rate >90%), such as adequately treated carcinoma in situ of the cervix, non-melanoma carcinoma, localized prostate cancer, or ductal carcinoma in situ Unstable cardiac disease, myocardial infarction, or New York Heart Association Class III or IV heart failure within 12 months prior to  screening History or absence of an abnormal ECG that is deemed clinically significant by the investigator, including complete left bundle branch block or second-  or third-degree atrioventricular heart block     Integrated Pharmacokinetic/Pharmacodynamic Analysis:  In Studies AO13086, [id number], and EX52841, exploratory biomarker analysis showed there were consistent decreases in blood eosinophil counts throughout the treatment period, potentially mediated by a direct effect of IL-33 on eosinophil progenitors. In [place], there was no significant difference in fractional exhaled nitric oxide (FeNO) levels (reflecting airway IL-4/IL-13 activity) between astegolimab-treated groups relative to placebo throughout the treatment period. These data suggest that astegolimab has only a limited effect on Type 2 inflammation in asthma. No data are applicable for Study [id number]. No data are available yet for Study [id number].  Special Warnings/Considerations: Administration of astegolimab, a protein therapeutic, may lead to the development of anti-astegolimab antibodies which could lead to AEs and/or decreased exposure. In non-clinical studies (see Section 4.2), ADA incidence has generally been low and there was no apparent ADA impact on PK and safety in these studies. To date, the immunogenicity rates observed with astegolimab in clinical studies have been relatively low as well (see Immunogenicity Section 5.6). Several clinical studies have been conducted in patients with asthma, atopic dermatitis, COPD (IIS Study YQ03474) and COVID-19 severe pneumonia, which generally showed low incidence of ADAs (Table 31). There has been no correlation between ADA status and clinical findings or increased incidence of AEs. Astegolimab is now being considered in a larger study for the treatment of COPD. This patient population is typically considered to have a hyper-responsive immune system. Route of administration for this molecule will be [place], either Q2W or Q4W. These factors increase the risk of development of an immune response to astegolimab, specifically with repeat dosing. From the previous  IIS Study [id number], incidence of ADAs was low in COPD patients; this remains to be confirmed in a larger study. To monitor ADA development in ongoing studies, serum samples will be collected from patients at protocol-defined intervals. Patients who test positive for antibodies and have clinical sequelae that are considered potentially related to an ADA response may also be asked to return for additional follow-up testing.  Drug Interaction Studies: No PK drug interaction studies have been conducted to date.  Serious Adverse Reactions Observed in Asthma Study: During the treatment period, a similar proportion of patients across all cohorts experienced at least one AE, regardless of causality (Table 22). In total, 77.2%, 70.9%, 72.2%, and 72.1% of patients reported at least 1 AE in the placebo, 70 mg, 210 mg, and 490 mg groups, respectively. The most common AE's (>5% in any treatment group) were asthma, nasopharyngitis, upper respiratory tract infection, headache, and injection site reaction (ISR). The most common drug-related AE was ISR, which was reported more frequently in the astegolimab treatment groups than in the placebo group (1 [0.8%] patient in the placebo group, 10 [7.9%] patients with 70 mg, 8 [6.3%] patients with 210 mg, 6 [4.9%] patients with 490 mg). All ISRs were non-serious and mild or moderate in severity. During the treatment period, 50 SAEs were reported in 37 (7.4%) patients. The number of patients reporting SAEs was comparable across all cohorts (11 SAEs in 8 patients on placebo, 21 SAEs in 14 patients on 70 mg, 11 SAEs in 9 patients on 210 mg, and 7 SAEs in 6 patients on 490 mg). The most common SAE was asthma. One SAE of moderate livedo reticularis (70 mg) was considered a suspected unexpected serious adverse drug  reaction (SUSAR) related to astegolimab and was reported two days after the second dose, leading to discontinuation of astegolimab. Two (0.4%) patients reported anaphylaxis and  hypersensitivity reactions: 1 severe SAE of anaphylactic reaction (placebo), and 1 moderate hypersensitivity (490 mg) considered related to astegolimab. Three (0.6%) patients experienced a potential Major Adverse Cardiac Event (MACE) (1 patient each in the placebo, 70 mg, and 210 mg groups). None of the potential MACE were considered related to astegolimab. Overall, 233 (46.4%) patients reported events of infection. A comparable number of patients reported infection across all treatment groups (65 [51.2%] patients on placebo, 55 [43.3%] patients on 70 mg, 58 [46.0%] patients on 210 mg, and 55 [45.1%] patients on 490 mg). The most frequently reported infection (?10% incidence) was nasopharyngitis (12.7%). One patient on astegolimab 210 mg and the partner of one patient on placebo became pregnant during the study. Both delivered normal/healthy babies. Two deaths, unrelated to study drug, were reported: one patient on 210 mg astegolimab died following an SAE of asthma; the other patient on 490 mg astegolimab had an unexplained death. There were no clinically meaningful changes in laboratory parameters, vital signs, or ECG results, other than the 10% decrease in mean blood eosinophil counts in the astegolimab-treated groups, with no safety concerns. Treatment-induced ADAs were comparable between the astegolimab groups and had no impact on safety. Overall, astegolimab was well tolerated at all doses used and had a safety profile consistent with that observed in the previous astegolimab Phase I studies.  Serious Adverse Reactions Observed in Previous COPD Study: In the completed IIS Study [id number], a total of 81 patients received at least one dose of astegolimab or placebo. The safety profile of astegolimab was similar to that of placebo. There were a total of 222 AEs reported in 62 patients. A total of 28 (72%) patients in the placebo arm and 34 (81%) patients in the astegolimab arm reported at least one AE. The most  commonly reported AEs were headache followed by urinary tract infection and viral upper respiratory tract infection. A total of 39 SAEs were reported in 28 (35%) patients; 16 (41%) patients in the placebo group and 12 (29%) in the astegolimab group. The most commonly reported SAE was hospital admission for community acquired pneumonia. Four SAEs resulted in patient discontinuation from treatment (1 patient in the placebo group and 3 patients in the astegolimab group). One patient in the placebo group experienced an AESI of potential MACE (heart failure, unrelated to the study treatment). No anaphylaxis or pregnancies were reported. Two deaths, unrelated to study treatment, were reported: 1 patient in the placebo group died after hospital acquired pneumonia, and another patient in the placebo group died after pneumonia and type 2 respiratory failure.  Safety Data: Astegolimab has been generally well tolerated. There have been 49 patient deaths across all astegolimab studies (Section 5.5.2), none of which were considered related to astegolimab. A total of 144 subjects experienced a total of 224 SAEs across all astegolimab studies. Of these, an SAE livedo reticularis observed in 1 patient was considered related to astegolimab by the investigator (Section 5.5.3). AEs leading to withdrawal (Section 5.5.4) have generally occurred at a rate similar to what would be expected for clinical trials in the studies' respective indications.   [facility] [place] Clinical Research Coordinator note :    The consent for this encounter is under Protocol Version 2.0 , Investigator Brochure Version 7, Consent Version IRB Approved  revised  and is currently IRB approved.  Subject expressed continued interest and consent in continuing as a study subject. Subject confirmed that there was no change in contact information (e.g. address, telephone, email). Subject thanked for participation in research and contribution to science.     The Subject was informed that the PI Dr. Marchelle Gearing continues to have oversight of the subject's visits and course  through relevant discussions, reviews and also specifically of this visit by routing of this note to the PI. [facility] [place] Clinical Research Coordinator note:   This visit for Subject Maria [name] with DOB: 1956-05-19 on 01/04/2023 for the above protocol is Visit 9  and is for purpose of research.   Subject expressed continued interest and consent in continuing as a study subject. Subject confirmed that there was  no  change in contact information (e.g. address, telephone, email). Subject thanked for participation in research and contribution to science. The Subject was yes informed that the PI Kalman Shan, MD continues to have oversight of the subject's visits and course through relevant discussions, reviews, and also specifically of this visit by routing of this note to the PI.  All procedures and assessments were completed as stated in above protocol. Subject tolerated injections well. Refer to subjects paper source binder for further details of this visit.     Signed by  Jerolyn Shin, [facility]  Clinical Research Coordinator  [facility]  [place], [place] 40:98 AM 01/04/2023

## 2023-01-11 ENCOUNTER — Ambulatory Visit
Payer: Medicare HMO | Attending: Student in an Organized Health Care Education/Training Program | Admitting: Student in an Organized Health Care Education/Training Program

## 2023-01-11 ENCOUNTER — Other Ambulatory Visit: Payer: Self-pay | Admitting: Internal Medicine

## 2023-01-11 ENCOUNTER — Other Ambulatory Visit: Payer: Self-pay | Admitting: Nurse Practitioner

## 2023-01-11 DIAGNOSIS — G479 Sleep disorder, unspecified: Secondary | ICD-10-CM

## 2023-01-11 DIAGNOSIS — M792 Neuralgia and neuritis, unspecified: Secondary | ICD-10-CM | POA: Diagnosis not present

## 2023-01-11 DIAGNOSIS — Q761 Klippel-Feil syndrome: Secondary | ICD-10-CM | POA: Diagnosis not present

## 2023-01-11 DIAGNOSIS — M5010 Cervical disc disorder with radiculopathy, unspecified cervical region: Secondary | ICD-10-CM

## 2023-01-11 DIAGNOSIS — G894 Chronic pain syndrome: Secondary | ICD-10-CM

## 2023-01-11 NOTE — Progress Notes (Signed)
Patient: Maria [name]  Service Category: E/M  Provider: Edward Jolly, MD  DOB: 1955-08-10  DOS: 01/11/2023  Location: Office  MRN: [id number]  Setting: Ambulatory outpatient  Referring Provider: Eden Emms, NP  Type: Established Patient  Specialty: Interventional Pain Management  PCP: Eden Emms, NP  Location: Remote location  Delivery: TeleHealth     Virtual Encounter - Pain Management PROVIDER NOTE: Information contained herein reflects review and annotations entered in association with encounter. Interpretation of such information and data should be left to medically-trained personnel. Information provided to patient can be located elsewhere in the medical record under "Patient Instructions". Document created using STT-dictation technology, any transcriptional errors that may result from process are unintentional.    Contact & Pharmacy Preferred: [phone] Home: [phone] (home) Mobile: [phone] (mobile) E-mail: [email or web address]  [facility] - [place], [place] - [address] [place] [address] Phone: [phone] Fax: [phone]  [facility] [place], [place] - [address] [place] [address] Phone: [phone] Fax: [phone]   Pre-screening  Maria [name] offered "in-person" vs "virtual" encounter. She indicated preferring virtual for this encounter.   Reason COVID-19*  Social distancing based on CDC and AMA recommendations.   I contacted Maria [name] on 01/11/2023 via telephone.      I clearly identified myself as Edward Jolly, MD. I verified that I was speaking with the correct person using two identifiers (Name: Maria [name], and date of birth: 1955-10-04).  Consent I sought verbal advanced consent from Maria [name] for virtual visit interactions. I informed Maria [name] of possible security and privacy concerns, risks, and limitations associated with providing  "not-in-person" medical evaluation and management services. I also informed Maria [name] of the availability of "in-person" appointments. Finally, I informed her that there would be a charge for the virtual visit and that she could be  personally, fully or partially, financially responsible for it. Ms. Guhl expressed understanding and agreed to proceed.   Historic Elements   Maria [name] is a 67 y.o. year old, female patient evaluated today after our last contact on 12/14/2022. Maria [name]  has a past medical history of Atherosclerosis (1/0612014), Blood transfusion without reported diagnosis (2005), Cancer (HCC) (Ovarian 1978), Chest pain, COPD (chronic obstructive pulmonary disease) (HCC), COVID-19 long hauler manifesting chronic dyspnea, Emphysema of lung (HCC) (2019), Oxygen deficiency (03/2019), and Oxygen dependent. She also  has a past surgical history that includes Abdominal hysterectomy (1978); Ovarian cyst removal (1988); Neck surgery (1999); Spine surgery (Neck 2002); Cholecystectomy (2010); Colonoscopy; Colonoscopy; Esophagogastroduodenoscopy; and Colonoscopy with propofol (N/A, 11/01/2021). Maria [name] has a current medication list which includes the following prescription(s): albuterol, aspirin low dose, atorvastatin, azelastine hcl, benzonatate, breztri aerosphere, vitamin d-3, escitalopram, gabapentin, hydroxyzine, ipratropium-albuterol, OVER THE COUNTER MEDICATION, oxybutynin, oxygen-helium, pantoprazole, potassium, flutter, roflumilast, and sodium chloride hypertonic, and the following Facility-Administered Medications: arnasa [id number] astegolimab or placebo. She  reports that she quit smoking about 6 years ago. Her smoking use included cigarettes. She has a 45.00 pack-year smoking history. She has been exposed to tobacco smoke. She has never used smokeless tobacco. She reports that she does not drink alcohol and does not use drugs. Maria [name] is allergic to montelukast.  BMI: Estimated body mass index  is 22.63 kg/m as calculated from the following:   Height as of 12/26/22: 5\' 5"  (1.651 m).   Weight as of 12/26/22: 136 lb (61.7 kg). Last encounter:  11/03/2022. Last procedure: 12/14/2022.  HPI  Today, she is being contacted for a post-procedure assessment.   Post-procedure evaluation   Procedure: Cervical Epidural Steroid injection (CESI) (Interlaminar) #1  Laterality: Right  Level: C7-T1 Imaging: Fluoroscopy-assisted DOS: 12/14/2022  Performed by: Edward Jolly, MD Anesthesia: Local anesthesia (1-2% Lidocaine) Anxiolysis: Valium 5 mg PO  Purpose: Diagnostic/Therapeutic Indications: Cervicalgia, cervical radicular pain, degenerative disc disease, severe enough to impact quality of life or function. 1. Cervical fusion syndrome (C6-7)   2. Cervical disc disorder with radiculopathy of cervical region (right)   3. Chronic pain syndrome    NAS-11 score:   Pre-procedure: 8 /10   Post-procedure: 6 /10      Effectiveness:  Initial hour after procedure: 50 %  Subsequent 4-6 hours post-procedure: 50 %  Analgesia past initial 6 hours: 50% Ongoing improvement:  Analgesic:  50% Function: Back to baseline ROM: Back to baseline   Laboratory Chemistry Profile   Renal Lab Results  Component Value Date   BUN 13 09/01/2022   CREATININE 0.65 09/01/2022   GFR 92.32 09/01/2022   GFRAA >60 03/03/2019   GFRNONAA >60 03/03/2019    Hepatic Lab Results  Component Value Date   AST 20 10/15/2021   ALT 17 10/15/2021   ALBUMIN 4.2 10/15/2021   ALKPHOS 72 10/15/2021   LIPASE 19 11/09/2016    Electrolytes Lab Results  Component Value Date   NA 139 09/01/2022   K 4.0 09/01/2022   CL 103 09/01/2022   CALCIUM 9.6 09/01/2022   MG 2.0 03/19/2019   PHOS 3.4 03/19/2019    Bone Lab Results  Component Value Date   VD25OH 35.22 06/03/2019    Inflammation (CRP: Acute Phase) (ESR: Chronic Phase) Lab Results  Component Value Date   CRP 1.3 (H) 03/03/2019   ESRSEDRATE 41 (H) 07/03/2019          Note: Above Lab results reviewed.  Assessment  The primary encounter diagnosis was Cervical fusion syndrome (C6-7). Diagnoses of Cervical disc disorder with radiculopathy of cervical region (right), Radicular pain in right arm, and Chronic pain syndrome were also pertinent to this visit.  Plan of Care  1. Cervical fusion syndrome (C6-7) - Cervical Epidural Injection; Future  2. Cervical disc disorder with radiculopathy of cervical region (right) - Cervical Epidural Injection; Future  3. Radicular pain in right arm - Cervical Epidural Injection; Future  4. Chronic pain syndrome - Cervical Epidural Injection; Future  Repeat cervical ESI #2  Orders:  Orders Placed This Encounter  Procedures   Cervical Epidural Injection    Sedation: PO Valium Purpose: Diagnostic/Therapeutic Indication(s): Radiculitis and cervicalgia associater with cervical degenerative disc disease.    Standing Status:   Future    Standing Expiration Date:   04/13/2023    Scheduling Instructions:     Procedure: Cervical Epidural Steroid Injection/Block     Level(s): C7-T1     Laterality: TBD     Timeframe: As soon as schedule allows    Order Specific Question:   Where will this procedure be performed?    Answer:   [facility]    Comments:   Dominie Benedick   Follow-up plan:   Return in about 3 weeks (around 02/01/2023) for R C-ESI #2, in clinic (PO Valium).      Future interventional options: -Right cervical ESI 12/14/22 -Cervical facet medial branch nerve blocks -Glenohumeral joint injection -Suprascapular nerve block  Also consider trial of Lyrica      Recent Visits Date Type Provider Dept  12/14/22 Procedure visit Edward Jolly, MD [facility]  11/03/22 Office Visit Edward Jolly, MD [facility]  Showing recent visits within past 90 days and meeting all other requirements Today's Visits Date Type Provider Dept  01/11/23 Office Visit Edward Jolly, MD [facility]  Showing today's visits and meeting all other requirements Future Appointments No visits were found meeting these conditions. Showing future appointments within next 90 days and meeting all other requirements  I discussed the assessment and treatment plan with the patient. The patient was provided an opportunity to ask questions and all were answered. The patient agreed with the plan and demonstrated an understanding of the instructions.  Patient advised to call back or seek an in-person evaluation if the symptoms or condition worsens.  Duration of encounter: .  Note by: Edward Jolly, MD Date: 01/11/2023; Time: 4:12 PM

## 2023-01-18 ENCOUNTER — Encounter: Payer: Medicare HMO | Admitting: *Deleted

## 2023-01-18 DIAGNOSIS — J441 Chronic obstructive pulmonary disease with (acute) exacerbation: Secondary | ICD-10-CM

## 2023-01-18 DIAGNOSIS — Z006 Encounter for examination for normal comparison and control in clinical research program: Secondary | ICD-10-CM

## 2023-01-18 MED ORDER — STUDY - ARNASA GB44332 - ASTEGOLIMAB 238 MG/1.7 ML OR PLACEBO SQ INJECTION (PI-RAMASWAMY)
476.0000 mg | INJECTION | SUBCUTANEOUS | Status: DC
  Administered 2023-01-18: 476 mg via SUBCUTANEOUS
  Filled 2023-01-18: qty 3.4

## 2023-01-18 NOTE — Research (Signed)
Title: A Phase III, randomized, double-blind, placebo controlled, multicenter study to evaluate the efficacy and safety of astegolimab in patients with chronic obstructive pulmonary disease    Dose and Duration of Treatment: Astegolimab is presented as a sterile, slightly brown-yellow solution. Each single-use, 2.25 mL pre-filled syringe contains 1.7 mL deliverable volume. Astegolimab drug product is formulated at 140 mg/mL astegolimab with 114 mM succinic acid, 200 mM L-arginine, 10 mM L-methionine, 0.06% (w/v) polysorbate 20, pH 5.7. Placebo for astegolimab is supplied in an identical pre-filled syringe configuration.   Protocol # [id number]   Sponsor: [employer] [address], [place]   Protocol: Version 3, dated 26/May2023, Version 4 dated 20Jun2023 IB: version 9.0 dated April 2024 ICF: Main version 12Apr2023, revised 17July2023 Mobile Nursing 01Sep2023, revised 01Sep2023 Lab Manual: V4.0.0 20Oct2023   Mechanism of action: Astegolimab (also known as [id number] or [id number]) is a fully human, IgG2 monoclonal antibody that binds with high affinity to the interleukin (IL)-33 (IL-33) receptor, ST2, thereby blocking the signaling of IL-33, an inflammatory cytokine of the IL-1 family and member of the "alarmin" class of molecules. Astegolimab binds with high affinity to the human and cynomolgus monkey receptor for IL-33, ST2, and blocks IL-33 binding, thus inhibiting association with the IL-1R accessory protein (AcP) co-receptor and formation of an activated receptor complex.    Integrated Pharmacokinetic/Pharmacodynamic Analysis:  In Studies [id number], [id number], and [id number], exploratory biomarker analysis showed there were consistent decreases in blood eosinophil counts throughout the treatment period, potentially mediated by a direct effect of IL-33 on eosinophil progenitors. In [place], there was no significant difference in fractional exhaled nitric oxide (FeNO)  levels (reflecting airway IL-4/IL-13 activity) between astegolimab-treated groups relative to placebo throughout the treatment period. These data suggest that astegolimab has only a limited effect on Type 2 inflammation in asthma. No data are applicable for Study [id number]. No data are available yet for Study [id number].  Special Warnings/Considerations: Administration of astegolimab, a protein therapeutic, may lead to the development of anti-astegolimab antibodies which could lead to AEs and/or decreased exposure. In non-clinical studies (see Section 4.2), ADA incidence has generally been low and there was no apparent ADA impact on PK and safety in these studies. To date, the immunogenicity rates observed with astegolimab in clinical studies have been relatively low as well (see Immunogenicity Section 5.6). Several clinical studies have been conducted in patients with asthma, atopic dermatitis, COPD (IIS Study BM84132) and COVID-19 severe pneumonia, which generally showed low incidence of ADAs (Table 31). There has been no correlation between ADA status and clinical findings or increased incidence of AEs. Astegolimab is now being considered in a larger study for the treatment of COPD. This patient population is typically considered to have a hyper-responsive immune system. Route of administration for this molecule will be [place], either Q2W or Q4W. These factors increase the risk of development of an immune response to astegolimab, specifically with repeat dosing. From the previous IIS Study [id number], incidence of ADAs was low in COPD patients; this remains to be confirmed in a larger study. To monitor ADA development in ongoing studies, serum samples will be collected from patients at protocol-defined intervals. Patients who test positive for antibodies and have clinical sequelae that are considered potentially related to an ADA response may also be asked to return for additional follow-up testing.  Drug Interaction  Studies: No PK drug interaction studies have been conducted to date.  Serious Adverse Reactions Observed in Asthma Study: During the treatment period, a similar  proportion of patients across all cohorts experienced at least one AE, regardless of causality (Table 22). In total, 77.2%, 70.9%, 72.2%, and 72.1% of patients reported at least 1 AE in the placebo, 70 mg, 210 mg, and 490 mg groups, respectively. The most common AE's (>5% in any treatment group) were asthma, nasopharyngitis, upper respiratory tract infection, headache, and injection site reaction (ISR). The most common drug-related AE was ISR, which was reported more frequently in the astegolimab treatment groups than in the placebo group (1 [0.8%] patient in the placebo group, 10 [7.9%] patients with 70 mg, 8 [6.3%] patients with 210 mg, 6 [4.9%] patients with 490 mg). All ISRs were non-serious and mild or moderate in severity. During the treatment period, 50 SAEs were reported in 37 (7.4%) patients. The number of patients reporting SAEs was comparable across all cohorts (11 SAEs in 8 patients on placebo, 21 SAEs in 14 patients on 70 mg, 11 SAEs in 9 patients on 210 mg, and 7 SAEs in 6 patients on 490 mg). The most common SAE was asthma. One SAE of moderate livedo reticularis (70 mg) was considered a suspected unexpected serious adverse drug reaction (SUSAR) related to astegolimab and was reported two days after the second dose, leading to discontinuation of astegolimab. Two (0.4%) patients reported anaphylaxis and hypersensitivity reactions: 1 severe SAE of anaphylactic reaction (placebo), and 1 moderate hypersensitivity (490 mg) considered related to astegolimab. Three (0.6%) patients experienced a potential Major Adverse Cardiac Event (MACE) (1 patient each in the placebo, 70 mg, and 210 mg groups). None of the potential MACE were considered related to astegolimab. Overall, 233 (46.4%) patients reported events of infection. A comparable number of  patients reported infection across all treatment groups (65 [51.2%] patients on placebo, 55 [43.3%] patients on 70 mg, 58 [46.0%] patients on 210 mg, and 55 [45.1%] patients on 490 mg). The most frequently reported infection (?10% incidence) was nasopharyngitis (12.7%). One patient on astegolimab 210 mg and the partner of one patient on placebo became pregnant during the study. Both delivered normal/healthy babies. Two deaths, unrelated to study drug, were reported: one patient on 210 mg astegolimab died following an SAE of asthma; the other patient on 490 mg astegolimab had an unexplained death. There were no clinically meaningful changes in laboratory parameters, vital signs, or ECG results, other than the 10% decrease in mean blood eosinophil counts in the astegolimab-treated groups, with no safety concerns. Treatment-induced ADAs were comparable between the astegolimab groups and had no impact on safety. Overall, astegolimab was well tolerated at all doses used and had a safety profile consistent with that observed in the previous astegolimab Phase I studies.  Serious Adverse Reactions Observed in Previous COPD Study: In the completed IIS Study [id number], a total of 81 patients received at least one dose of astegolimab or placebo. The safety profile of astegolimab was similar to that of placebo. There were a total of 222 AEs reported in 62 patients. A total of 28 (72%) patients in the placebo arm and 34 (81%) patients in the astegolimab arm reported at least one AE. The most commonly reported AEs were headache followed by urinary tract infection and viral upper respiratory tract infection. A total of 39 SAEs were reported in 28 (35%) patients; 16 (41%) patients in the placebo group and 12 (29%) in the astegolimab group. The most commonly reported SAE was hospital admission for community acquired pneumonia. Four SAEs resulted in patient discontinuation from treatment (1 patient in the placebo group and 3  patients in the astegolimab group). One patient in the placebo group experienced an AESI of potential MACE (heart failure, unrelated to the study treatment). No anaphylaxis or pregnancies were reported. Two deaths, unrelated to study treatment, were reported: 1 patient in the placebo group died after hospital acquired pneumonia, and another patient in the placebo group died after pneumonia and type 2 respiratory failure.  Safety Data: Astegolimab has been generally well tolerated. There have been 49 patient deaths across all astegolimab studies (Section 5.5.2), none of which were considered related to astegolimab. A total of 144 subjects experienced a total of 224 SAEs across all astegolimab studies. Of these, an SAE livedo reticularis observed in 1 patient was considered related to astegolimab by the investigator (Section 5.5.3). AEs leading to withdrawal (Section 5.5.4) have generally occurred at a rate similar to what would be expected for clinical trials in the studies' respective indications.   [facility] [place] Clinical Research Coordinator note :       The consent for this encounter is under Protocol Version 2.0 , Investigator Brochure Version 7, Consent Version IRB Approved  revised  and is currently IRB approved.    Subject expressed continued interest and consent in continuing as a study subject. Subject confirmed that there was no change in contact information (e.g. address, telephone, email). Subject thanked for participation in research and contribution to science.   The Subject was informed that the PI Dr. Marchelle Gearing continues to have oversight of the subject's visits and course  through relevant discussions, reviews and also specifically of this visit by routing of this note to the PI. [facility] [place] Clinical Research Coordinator note:   This visit for Subject Maria [name] with DOB: 07-Oct-1955 on 01/18/2023 for the above protocol is Visit# 10  and is for purpose of research.    The subject expressed continued interest and consent in continuing as a study subject. Subject confirmed that there was  no  change in contact information (e.g. address, telephone, email). Subject thanked for participation in research and contribution to science. The Subject was yes  informed that the PI Kalman Shan, MD continues to have oversight of the subject's visits and course through relevant discussions, reviews, and also specifically of this visit by routing of this note to the PI.  All procedures and assessments were completed for this visit per above stated protocol. Subject tolerated injections well. Please refer to subjects paper source binder for further details of this visit.   Signed by  Jerolyn Shin, [facility]  Clinical Research Coordinator / Nurse [facility]  [place], [place] 10:24 AM 01/18/2023

## 2023-01-19 ENCOUNTER — Other Ambulatory Visit (HOSPITAL_COMMUNITY): Payer: Self-pay

## 2023-01-20 ENCOUNTER — Other Ambulatory Visit: Payer: Self-pay | Admitting: Nurse Practitioner

## 2023-01-20 DIAGNOSIS — I700 Atherosclerosis of aorta: Secondary | ICD-10-CM

## 2023-01-20 MED ORDER — ATORVASTATIN CALCIUM 10 MG PO TABS
10.0000 mg | ORAL_TABLET | Freq: Every day | ORAL | 2 refills | Status: DC
Start: 2023-01-20 — End: 2023-10-23

## 2023-01-20 NOTE — Telephone Encounter (Signed)
Pending Refill request for Atorvastatin  LAST APPOINTMENT DATE: 11/30/2022   NEXT APPOINTMENT DATE: 03/01/2023    LAST REFILL:10/26/2022 Given by Dr. Selena Batten  QTY: 90 2RF   Labs due.

## 2023-01-22 DIAGNOSIS — J9611 Chronic respiratory failure with hypoxia: Secondary | ICD-10-CM | POA: Diagnosis not present

## 2023-01-22 DIAGNOSIS — J449 Chronic obstructive pulmonary disease, unspecified: Secondary | ICD-10-CM | POA: Diagnosis not present

## 2023-01-23 ENCOUNTER — Other Ambulatory Visit (INDEPENDENT_AMBULATORY_CARE_PROVIDER_SITE_OTHER): Payer: Self-pay | Admitting: Vascular Surgery

## 2023-01-23 ENCOUNTER — Ambulatory Visit: Payer: Medicare HMO | Admitting: Physician Assistant

## 2023-01-23 DIAGNOSIS — I6523 Occlusion and stenosis of bilateral carotid arteries: Secondary | ICD-10-CM

## 2023-01-26 ENCOUNTER — Other Ambulatory Visit: Payer: Self-pay

## 2023-01-26 DIAGNOSIS — F320 Major depressive disorder, single episode, mild: Secondary | ICD-10-CM

## 2023-01-26 NOTE — Telephone Encounter (Signed)
LAST APPOINTMENT DATE: 11/30/22   NEXT APPOINTMENT DATE: 03/01/2023   LEXAPRO 20MG   LAST REFILL: 04/18/22  QTY: #90 1RF

## 2023-01-27 MED ORDER — ESCITALOPRAM OXALATE 20 MG PO TABS
20.0000 mg | ORAL_TABLET | Freq: Every day | ORAL | 1 refills | Status: DC
Start: 2023-01-27 — End: 2023-08-07

## 2023-01-28 NOTE — Progress Notes (Signed)
MRN : [id number]  Maria [name] is a 67 y.o. (1955/08/13) female who presents with chief complaint of check carotid arteries.  History of Present Illness:   The patient is seen for follow up evaluation of carotid stenosis. The carotid stenosis followed by ultrasound.    The patient denies amaurosis fugax. There is a recent history of TIA symptoms with left leg focal motor deficits. She is also experiencing increased episodes of syncope.  She denies vertigo. There is no prior documented CVA.   The patient is taking enteric-coated aspirin 81 mg daily.   There is no history of migraine headaches. There is no history of seizures.   No recent shortening of the patient's walking distance or new symptoms consistent with claudication.  No history of rest pain symptoms. No new ulcers or wounds of the lower extremities have occurred.   There is no history of DVT, PE or superficial thrombophlebitis. No recent episodes of angina or shortness of breath documented.    Carotid Duplex done today shows RICA 1-39% and LICA 80-99%.  LICA velocities have increased compared to last study in 07/28/2022   No outpatient medications have been marked as taking for the 01/30/23 encounter (Appointment) with Gilda Crease, Latina Craver, MD.   Current Facility-Administered Medications for the 01/30/23 encounter (Appointment) with Gilda Crease, Latina Craver, MD  Medication   Study - ARNASA [phone] - astegolimab 238 mg/1.7 mL or placebo SQ injection (PI-Ramaswamy)    Past Medical History:  Diagnosis Date   Atherosclerosis 1/0612014   aorta, iliacs and CFA bilaterally no greater than 0-49% - lower arterial doppler.   Blood transfusion without reported diagnosis 2005   Cancer [facility]) Ovarian 1978   Chest pain    COPD (chronic obstructive pulmonary disease) (HCC)    COVID-19 long hauler manifesting chronic dyspnea    Emphysema of lung (HCC) 2019   Oxygen deficiency 03/2019   Oxygen dependent    3 liters up  to 5    Past Surgical History:  Procedure Laterality Date   ABDOMINAL HYSTERECTOMY  1978   CHOLECYSTECTOMY  2010   COLONOSCOPY     COLONOSCOPY     COLONOSCOPY WITH PROPOFOL N/A 11/01/2021   Procedure: COLONOSCOPY WITH PROPOFOL;  Surgeon: Wyline Mood, MD;  Location: [facility] ENDOSCOPY;  Service: Gastroenterology;  Laterality: N/A;   ESOPHAGOGASTRODUODENOSCOPY     NECK SURGERY  1999   OVARIAN CYST REMOVAL  1988   SPINE SURGERY  Neck 2002    Social History Social History   Tobacco Use   Smoking status: Former    Current packs/day: 0.00    Average packs/day: 1 pack/day for 45.0 years (45.0 ttl pk-yrs)    Types: Cigarettes    Start date: 03/19/1971    Quit date: 03/18/2016    Years since quitting: 6.8    Passive exposure: Past   Smokeless tobacco: Never  Vaping Use   Vaping status: Never Used  Substance Use Topics   Alcohol use: No    Alcohol/week: 0.0 standard drinks of alcohol   Drug use: No    Family History Family History  Problem Relation Age of Onset   Heart failure Mother    Diabetes Mother    Kidney disease Mother    Hypertension Mother    Arthritis Mother    Heart disease Mother    Obesity Mother    COPD Father  Hearing loss Father    Lung cancer Brother    COPD Brother    COPD Brother    Asthma Son    Diabetes Son    Birth defects Son    Breast cancer Maternal Aunt 40    Allergies  Allergen Reactions   Montelukast     Bad dreams     REVIEW OF SYSTEMS (Negative unless checked)  Constitutional: [] Weight loss  [] Fever  [] Chills Cardiac: [] Chest pain   [] Chest pressure   [] Palpitations   [] Shortness of breath when laying flat   [] Shortness of breath with exertion. Vascular:  [x] Pain in legs with walking   [] Pain in legs at rest  [] History of DVT   [] Phlebitis   [] Swelling in legs   [] Varicose veins   [] Non-healing ulcers Pulmonary:   [] Uses home oxygen   [] Productive cough   [] Hemoptysis   [] Wheeze  [] COPD   [] Asthma Neurologic:  [] Dizziness    [] Seizures   [] History of stroke   [] History of TIA  [] Aphasia   [] Vissual changes   [] Weakness or numbness in arm   [] Weakness or numbness in leg Musculoskeletal:   [] Joint swelling   [] Joint pain   [] Low back pain Hematologic:  [] Easy bruising  [] Easy bleeding   [] Hypercoagulable state   [] Anemic Gastrointestinal:  [] Diarrhea   [] Vomiting  [] Gastroesophageal reflux/heartburn   [] Difficulty swallowing. Genitourinary:  [] Chronic kidney disease   [] Difficult urination  [] Frequent urination   [] Blood in urine Skin:  [] Rashes   [] Ulcers  Psychological:  [] History of anxiety   []  History of major depression.  Physical Examination  There were no vitals filed for this visit. There is no height or weight on file to calculate BMI. Gen: WD/WN, NAD Head: [place]/AT, No temporalis wasting.  Ear/Nose/Throat: Hearing grossly intact, nares w/o erythema or drainage Eyes: PER, EOMI, sclera nonicteric.  Neck: Supple, no masses.  No bruit or JVD.  Pulmonary:  Good air movement, no audible wheezing, no use of accessory muscles.  Cardiac: RRR, normal S1, S2, no Murmurs. Vascular:  carotid bruit noted left Vessel Right Left  Radial Palpable Palpable  Carotid  Palpable  Palpable  Subclav  Palpable Palpable  Gastrointestinal: soft, non-distended. No guarding/no peritoneal signs.  Musculoskeletal: M/S 5/5 throughout.  No visible deformity.  Neurologic: CN 2-12 intact. Pain and light touch intact in extremities.  Symmetrical.  Speech is fluent. Motor exam as listed above. Psychiatric: Judgment intact, Mood & affect appropriate for pt's clinical situation. Dermatologic: No rashes or ulcers noted.  No changes consistent with cellulitis.   CBC Lab Results  Component Value Date   WBC 10.8 (H) 09/01/2022   HGB 12.4 09/01/2022   HCT 37.7 09/01/2022   MCV 94.7 09/01/2022   PLT 328.0 09/01/2022    BMET    Component Value Date/Time   NA 139 09/01/2022 0957   K 4.0 09/01/2022 0957   CL 103 09/01/2022 0957    CO2 29 09/01/2022 0957   GLUCOSE 85 09/01/2022 0957   BUN 13 09/01/2022 0957   CREATININE 0.65 09/01/2022 0957   CALCIUM 9.6 09/01/2022 0957   GFRNONAA >60 03/03/2019 0154   GFRAA >60 03/03/2019 0154   CrCl cannot be calculated (Patient's most recent lab result is older than the maximum 21 days allowed.).  COAG Lab Results  Component Value Date   INR 1.0 10/03/2008   INR 1.0 10/02/2008    Radiology No results found.   Assessment/Plan 1. Bilateral carotid artery stenosis The patient remains asymptomatic with respect  to the carotid stenosis.  However, the patient has now progressed and has a lesion the is >70%.  Patient should undergo CT angiography of the carotid arteries to define the degree of stenosis of the internal carotid arteries bilaterally and the anatomic suitability for surgery vs. intervention.  If the patient does indeed need surgery cardiac clearance will be required, once cleared the patient will be scheduled for surgery.  The risks, benefits and alternative therapies were reviewed in detail with the patient.  All questions were answered.  The patient agrees to proceed with imaging.  Continue antiplatelet therapy as prescribed. Continue management of CAD, HTN and Hyperlipidemia. Healthy heart diet, encouraged exercise at least 4 times per week.  - CT ANGIO NECK W OR WO CONTRAST; Future  2. Atherosclerosis of native artery of both lower extremities with intermittent claudication (HCC)  Recommend:  The patient has evidence of atherosclerosis of the lower extremities with claudication.  The patient does not voice lifestyle limiting changes at this point in time.  Noninvasive studies do not suggest clinically significant change.  No invasive studies, angiography or surgery at this time The patient should continue walking and begin a more formal exercise program.  The patient should continue antiplatelet therapy and aggressive treatment of the lipid  abnormalities  No changes in the patient's medications at this time  Continued surveillance is indicated as atherosclerosis is likely to progress with time.    The patient will continue follow up with noninvasive studies as ordered.   3. Stage 2 moderate COPD by GOLD classification (HCC) Continue pulmonary medications and aerosols as already ordered, these medications have been reviewed and there are no changes at this time.   4. Mixed hyperlipidemia Continue statin as ordered and reviewed, no changes at this time    Levora Dredge, MD  01/28/2023 2:43 PM

## 2023-01-29 DIAGNOSIS — M5010 Cervical disc disorder with radiculopathy, unspecified cervical region: Secondary | ICD-10-CM | POA: Diagnosis not present

## 2023-01-29 DIAGNOSIS — Q761 Klippel-Feil syndrome: Secondary | ICD-10-CM | POA: Diagnosis not present

## 2023-01-29 DIAGNOSIS — G894 Chronic pain syndrome: Secondary | ICD-10-CM | POA: Diagnosis not present

## 2023-01-29 DIAGNOSIS — S92352S Displaced fracture of fifth metatarsal bone, left foot, sequela: Secondary | ICD-10-CM | POA: Diagnosis not present

## 2023-01-29 DIAGNOSIS — M25511 Pain in right shoulder: Secondary | ICD-10-CM | POA: Diagnosis not present

## 2023-01-30 ENCOUNTER — Ambulatory Visit (INDEPENDENT_AMBULATORY_CARE_PROVIDER_SITE_OTHER): Payer: Medicare HMO

## 2023-01-30 ENCOUNTER — Encounter (INDEPENDENT_AMBULATORY_CARE_PROVIDER_SITE_OTHER): Payer: Self-pay | Admitting: Vascular Surgery

## 2023-01-30 ENCOUNTER — Ambulatory Visit (INDEPENDENT_AMBULATORY_CARE_PROVIDER_SITE_OTHER): Payer: Medicare HMO | Admitting: Vascular Surgery

## 2023-01-30 VITALS — BP 145/73 | HR 66 | Resp 15

## 2023-01-30 DIAGNOSIS — I6523 Occlusion and stenosis of bilateral carotid arteries: Secondary | ICD-10-CM | POA: Diagnosis not present

## 2023-01-30 DIAGNOSIS — I70213 Atherosclerosis of native arteries of extremities with intermittent claudication, bilateral legs: Secondary | ICD-10-CM | POA: Diagnosis not present

## 2023-01-30 DIAGNOSIS — E782 Mixed hyperlipidemia: Secondary | ICD-10-CM

## 2023-01-30 DIAGNOSIS — J449 Chronic obstructive pulmonary disease, unspecified: Secondary | ICD-10-CM | POA: Diagnosis not present

## 2023-02-01 ENCOUNTER — Encounter: Payer: Medicare HMO | Admitting: *Deleted

## 2023-02-01 DIAGNOSIS — J441 Chronic obstructive pulmonary disease with (acute) exacerbation: Secondary | ICD-10-CM

## 2023-02-01 DIAGNOSIS — Z006 Encounter for examination for normal comparison and control in clinical research program: Secondary | ICD-10-CM

## 2023-02-01 MED ORDER — STUDY - ARNASA GB44332 - ASTEGOLIMAB 238 MG/1.7 ML OR PLACEBO SQ INJECTION (PI-RAMASWAMY)
476.0000 mg | INJECTION | SUBCUTANEOUS | Status: DC
  Administered 2023-02-01: 476 mg via SUBCUTANEOUS
  Filled 2023-02-01: qty 3.4

## 2023-02-01 NOTE — Research (Signed)
Title: A Phase III, randomized, double-blind, placebo controlled, multicenter study to evaluate the efficacy and safety of astegolimab in patients with chronic obstructive pulmonary disease    Dose and Duration of Treatment: Astegolimab is presented as a sterile, slightly brown-yellow solution. Each single-use, 2.25 mL pre-filled syringe contains 1.7 mL deliverable volume. Astegolimab drug product is formulated at 140 mg/mL astegolimab with 114 mM succinic acid, 200 mM L-arginine, 10 mM L-methionine, 0.06% (w/v) polysorbate 20, pH 5.7. Placebo for astegolimab is supplied in an identical pre-filled syringe configuration.   Protocol # [id number]   Sponsor: [employer] [address] [place]  Protocol: Version 3, dated 26/May2023, Version 4 dated 20Jun2023 IB: version 9.0 dated April 2024 ICF: Main version 12Apr2023, revised 17July2023 Mobile Nursing 01Sep2023, revised 01Sep2023 Lab Manual: V4.0.0 20Oct2023   Mechanism of action: Astegolimab (also known as [id number] or [id number]) is a fully human, IgG2 monoclonal antibody that binds with high affinity to the interleukin (IL)-33 (IL-33) receptor, ST2, thereby blocking the signaling of IL-33, an inflammatory cytokine of the IL-1 family and member of the "alarmin" class of molecules. Astegolimab binds with high affinity to the human and cynomolgus monkey receptor for IL-33, ST2, and blocks IL-33 binding, thus inhibiting association with the IL-1R accessory protein (AcP) co-receptor and formation of an activated receptor complex.      Integrated Pharmacokinetic/Pharmacodynamic Analysis:  In Studies [id number], [id number], and [id number], exploratory biomarker analysis showed there were consistent decreases in blood eosinophil counts throughout the treatment period, potentially mediated by a direct effect of IL-33 on eosinophil progenitors. In [place], there was no significant difference in fractional exhaled nitric oxide  (FeNO) levels (reflecting airway IL-4/IL-13 activity) between astegolimab-treated groups relative to placebo throughout the treatment period. These data suggest that astegolimab has only a limited effect on Type 2 inflammation in asthma. No data are applicable for Study [id number]. No data are available yet for Study [id number].  Special Warnings/Considerations: Administration of astegolimab, a protein therapeutic, may lead to the development of anti-astegolimab antibodies which could lead to AEs and/or decreased exposure. In non-clinical studies (see Section 4.2), ADA incidence has generally been low and there was no apparent ADA impact on PK and safety in these studies. To date, the immunogenicity rates observed with astegolimab in clinical studies have been relatively low as well (see Immunogenicity Section 5.6). Several clinical studies have been conducted in patients with asthma, atopic dermatitis, COPD (IIS Study BM84132) and COVID-19 severe pneumonia, which generally showed low incidence of ADAs (Table 31). There has been no correlation between ADA status and clinical findings or increased incidence of AEs. Astegolimab is now being considered in a larger study for the treatment of COPD. This patient population is typically considered to have a hyper-responsive immune system. Route of administration for this molecule will be [place], either Q2W or Q4W. These factors increase the risk of development of an immune response to astegolimab, specifically with repeat dosing. From the previous IIS Study [id number], incidence of ADAs was low in COPD patients; this remains to be confirmed in a larger study. To monitor ADA development in ongoing studies, serum samples will be collected from patients at protocol-defined intervals. Patients who test positive for antibodies and have clinical sequelae that are considered potentially related to an ADA response may also be asked to return for additional follow-up testing.  Drug  Interaction Studies: No PK drug interaction studies have been conducted to date.  Serious Adverse Reactions Observed in Asthma Study: During the treatment period, a  similar proportion of patients across all cohorts experienced at least one AE, regardless of causality (Table 22). In total, 77.2%, 70.9%, 72.2%, and 72.1% of patients reported at least 1 AE in the placebo, 70 mg, 210 mg, and 490 mg groups, respectively. The most common AE's (>5% in any treatment group) were asthma, nasopharyngitis, upper respiratory tract infection, headache, and injection site reaction (ISR). The most common drug-related AE was ISR, which was reported more frequently in the astegolimab treatment groups than in the placebo group (1 [0.8%] patient in the placebo group, 10 [7.9%] patients with 70 mg, 8 [6.3%] patients with 210 mg, 6 [4.9%] patients with 490 mg). All ISRs were non-serious and mild or moderate in severity. During the treatment period, 50 SAEs were reported in 37 (7.4%) patients. The number of patients reporting SAEs was comparable across all cohorts (11 SAEs in 8 patients on placebo, 21 SAEs in 14 patients on 70 mg, 11 SAEs in 9 patients on 210 mg, and 7 SAEs in 6 patients on 490 mg). The most common SAE was asthma. One SAE of moderate livedo reticularis (70 mg) was considered a suspected unexpected serious adverse drug reaction (SUSAR) related to astegolimab and was reported two days after the second dose, leading to discontinuation of astegolimab. Two (0.4%) patients reported anaphylaxis and hypersensitivity reactions: 1 severe SAE of anaphylactic reaction (placebo), and 1 moderate hypersensitivity (490 mg) considered related to astegolimab. Three (0.6%) patients experienced a potential Major Adverse Cardiac Event (MACE) (1 patient each in the placebo, 70 mg, and 210 mg groups). None of the potential MACE were considered related to astegolimab. Overall, 233 (46.4%) patients reported events of infection. A comparable  number of patients reported infection across all treatment groups (65 [51.2%] patients on placebo, 55 [43.3%] patients on 70 mg, 58 [46.0%] patients on 210 mg, and 55 [45.1%] patients on 490 mg). The most frequently reported infection (?10% incidence) was nasopharyngitis (12.7%). One patient on astegolimab 210 mg and the partner of one patient on placebo became pregnant during the study. Both delivered normal/healthy babies. Two deaths, unrelated to study drug, were reported: one patient on 210 mg astegolimab died following an SAE of asthma; the other patient on 490 mg astegolimab had an unexplained death. There were no clinically meaningful changes in laboratory parameters, vital signs, or ECG results, other than the 10% decrease in mean blood eosinophil counts in the astegolimab-treated groups, with no safety concerns. Treatment-induced ADAs were comparable between the astegolimab groups and had no impact on safety. Overall, astegolimab was well tolerated at all doses used and had a safety profile consistent with that observed in the previous astegolimab Phase I studies.  Serious Adverse Reactions Observed in Previous COPD Study: In the completed IIS Study [id number], a total of 81 patients received at least one dose of astegolimab or placebo. The safety profile of astegolimab was similar to that of placebo. There were a total of 222 AEs reported in 62 patients. A total of 28 (72%) patients in the placebo arm and 34 (81%) patients in the astegolimab arm reported at least one AE. The most commonly reported AEs were headache followed by urinary tract infection and viral upper respiratory tract infection. A total of 39 SAEs were reported in 28 (35%) patients; 16 (41%) patients in the placebo group and 12 (29%) in the astegolimab group. The most commonly reported SAE was hospital admission for community acquired pneumonia. Four SAEs resulted in patient discontinuation from treatment (1 patient in the placebo group  and  3 patients in the astegolimab group). One patient in the placebo group experienced an AESI of potential MACE (heart failure, unrelated to the study treatment). No anaphylaxis or pregnancies were reported. Two deaths, unrelated to study treatment, were reported: 1 patient in the placebo group died after hospital acquired pneumonia, and another patient in the placebo group died after pneumonia and type 2 respiratory failure.  Safety Data: Astegolimab has been generally well tolerated. There have been 49 patient deaths across all astegolimab studies (Section 5.5.2), none of which were considered related to astegolimab. A total of 144 subjects experienced a total of 224 SAEs across all astegolimab studies. Of these, an SAE livedo reticularis observed in 1 patient was considered related to astegolimab by the investigator (Section 5.5.3). AEs leading to withdrawal (Section 5.5.4) have generally occurred at a rate similar to what would be expected for clinical trials in the studies' respective indications.   [facility] [place] Clinical Research Coordinator note :      The consent for this encounter is under Protocol Version 2.0 , Investigator Brochure Version 7, Consent Version IRB Approved  revised  and is currently IRB approved.    Subject expressed continued interest and consent in continuing as a study subject. Subject confirmed that there was no change in contact information (e.g. address, telephone, email). Subject thanked for participation in research and contribution to science.    The Subject was informed that the PI Dr. Marchelle Gearing continues to have oversight of the subject's visits and course  through relevant discussions, reviews and also specifically of this visit by routing of this note to the PI.    [facility] [place] Clinical Research Coordinator note:   This visit for Subject Maria [name] with DOB: 26-Jun-1956 on 02/01/2023 for the above protocol is Visit/Encounter # [id number]  and is for  purpose of research.   Subject expressed continued interest and consent in continuing as a study subject. Subject confirmed that there was  no change in contact information (e.g. address, telephone, email). Subject thanked for participation in research and contribution to science. The Subject was yes, informed that the PI Murali Ramaswamy,MD continues to have oversight of the subject's visits and course through relevant discussions, reviews, and also specifically of this visit by routing of this note to the PI.  All procedures and assessments were completed per above stated protocol. Subject tolerated injections well. Please refer to subjects's paper source binder for further details of this visit.     Signed by  Jerolyn Shin, [facility]  Clinical Research Coordinator  [facility]  [place], [place] 10:24 AM 02/01/2023

## 2023-02-03 ENCOUNTER — Encounter (INDEPENDENT_AMBULATORY_CARE_PROVIDER_SITE_OTHER): Payer: Self-pay | Admitting: Vascular Surgery

## 2023-02-06 ENCOUNTER — Ambulatory Visit: Payer: Medicare HMO | Admitting: Physician Assistant

## 2023-02-06 ENCOUNTER — Ambulatory Visit
Admission: RE | Admit: 2023-02-06 | Discharge: 2023-02-06 | Disposition: A | Discharge status: Home / Self Care | Payer: Medicare HMO | Source: Ambulatory Visit | Attending: Student in an Organized Health Care Education/Training Program | Admitting: Student in an Organized Health Care Education/Training Program

## 2023-02-06 ENCOUNTER — Ambulatory Visit
Payer: Medicare HMO | Attending: Student in an Organized Health Care Education/Training Program | Admitting: Student in an Organized Health Care Education/Training Program

## 2023-02-06 ENCOUNTER — Encounter: Payer: Self-pay | Admitting: Student in an Organized Health Care Education/Training Program

## 2023-02-06 DIAGNOSIS — Q761 Klippel-Feil syndrome: Secondary | ICD-10-CM | POA: Insufficient documentation

## 2023-02-06 DIAGNOSIS — M5010 Cervical disc disorder with radiculopathy, unspecified cervical region: Secondary | ICD-10-CM | POA: Diagnosis not present

## 2023-02-06 DIAGNOSIS — M792 Neuralgia and neuritis, unspecified: Secondary | ICD-10-CM | POA: Insufficient documentation

## 2023-02-06 DIAGNOSIS — G894 Chronic pain syndrome: Secondary | ICD-10-CM | POA: Diagnosis not present

## 2023-02-06 DIAGNOSIS — M50123 Cervical disc disorder at C6-C7 level with radiculopathy: Secondary | ICD-10-CM | POA: Insufficient documentation

## 2023-02-06 DIAGNOSIS — M79601 Pain in right arm: Secondary | ICD-10-CM | POA: Diagnosis not present

## 2023-02-06 MED ORDER — IOHEXOL 180 MG/ML  SOLN
10.0000 mL | Freq: Once | INTRAMUSCULAR | Status: AC
  Administered 2023-02-06: 10 mL via EPIDURAL

## 2023-02-06 MED ORDER — DEXAMETHASONE SODIUM PHOSPHATE 10 MG/ML IJ SOLN
10.0000 mg | Freq: Once | INTRAMUSCULAR | Status: AC
  Administered 2023-02-06: 10 mg

## 2023-02-06 MED ORDER — SODIUM CHLORIDE 0.9% FLUSH
1.0000 mL | Freq: Once | INTRAVENOUS | Status: AC
  Administered 2023-02-06: 1 mL

## 2023-02-06 MED ORDER — LIDOCAINE HCL 2 % IJ SOLN
20.0000 mL | Freq: Once | INTRAMUSCULAR | Status: AC
  Administered 2023-02-06: 200 mg

## 2023-02-06 MED ORDER — ROPIVACAINE HCL 2 MG/ML IJ SOLN
1.0000 mL | Freq: Once | INTRAMUSCULAR | Status: AC
  Administered 2023-02-06: 1 mL via EPIDURAL

## 2023-02-06 MED ORDER — DIAZEPAM 5 MG PO TABS
5.0000 mg | ORAL_TABLET | ORAL | Status: AC
  Administered 2023-02-06: 5 mg via ORAL

## 2023-02-06 NOTE — Progress Notes (Signed)
PROVIDER NOTE: Interpretation of information contained herein should be left to medically-trained personnel. Specific patient instructions are provided elsewhere under "Patient Instructions" section of medical record. This document was created in part using STT-dictation technology, any transcriptional errors that may result from this process are unintentional.  Patient: Maria [name] Type: Established DOB: Jan 21, 1956 MRN: [id number] PCP: Eden Emms, NP  Service: Procedure DOS: 02/06/2023 Setting: Ambulatory Location: Ambulatory outpatient facility Delivery: Face-to-face Provider: Edward Jolly, MD Specialty: Interventional Pain Management Specialty designation: 09 Location: Outpatient facility Ref. Prov.: Edward Jolly, MD       Interventional Therapy   Procedure: Cervical Epidural Steroid injection (CESI) (Interlaminar) #1  Laterality: Right  Level: C7-T1 Imaging: Fluoroscopy-assisted DOS: 02/06/2023  Performed by: Edward Jolly, MD Anesthesia: Local anesthesia (1-2% Lidocaine) Anxiolysis: Valium 5 mg PO  Purpose: Diagnostic/Therapeutic Indications: Cervicalgia, cervical radicular pain, degenerative disc disease, severe enough to impact quality of life or function. 1. Cervical fusion syndrome (C6-7)   2. Radicular pain in right arm   3. Chronic pain syndrome   4. Cervical disc disorder with radiculopathy of cervical region (right)    NAS-11 score:   Pre-procedure: 7 /10   Post-procedure: 0-No pain/10      Position  Prep  Materials:  Location setting: Procedure suite Position: Prone, on modified reverse trendelenburg to facilitate breathing, with head in head-cradle. Pillows positioned under chest (below chin-level) with cervical spine flexed. Safety Precautions: Patient was assessed for positional comfort and pressure points before starting the procedure. Prepping solution: DuraPrep (Iodine Povacrylex [0.7% available iodine] and Isopropyl Alcohol, 74% w/w) Prep Area:  Entire  cervicothoracic region Approach: percutaneous, paramedial Intended target: Posterior cervical epidural space Materials Procedure:  Tray: Epidural Needle(s): Epidural (Tuohy) Qty: 1 Length: (90mm) 3.5-inch Gauge: 22G   Pre-op H&P Assessment:  [name] is a 67 y.o. (year old), female patient, seen today for interventional treatment. She  has a past surgical history that includes Abdominal hysterectomy (1978); Ovarian cyst removal (1988); Neck surgery (1999); Spine surgery (Neck 2002); Cholecystectomy (2010); Colonoscopy; Colonoscopy; Esophagogastroduodenoscopy; and Colonoscopy with propofol (N/A, 11/01/2021). [name] has a current medication list which includes the following prescription(s): albuterol, aspirin low dose, atorvastatin, azelastine hcl, benzonatate, breztri aerosphere, vitamin d-3, escitalopram, gabapentin, hydroxyzine, ipratropium-albuterol, oxybutynin, oxygen-helium, pantoprazole, potassium, flutter, roflumilast, sodium chloride hypertonic, and OVER THE COUNTER MEDICATION, and the following Facility-Administered Medications: arnasa [id number] astegolimab or placebo. Her primarily concern today is the Neck Pain  Initial Vital Signs:  Pulse/HCG Rate: (!) 57ECG Heart Rate: (!) 54 Temp: 97.9 F (36.6 C) Resp: 18 (on 3L O2 from home) BP: (!) 107/50 SpO2: 100 % (3L O2 from home)  BMI: Estimated body mass index is 22.63 kg/m as calculated from the following:   Height as of this encounter: 5\' 5"  (1.651 m).   Weight as of this encounter: 136 lb (61.7 kg).  Risk Assessment: Allergies: Reviewed. She is allergic to montelukast.  Allergy Precautions: None required Coagulopathies: Reviewed. None identified.  Blood-thinner therapy: None at this time Active Infection(s): Reviewed. None identified. [name] is afebrile  Site Confirmation: [name] was asked to confirm the procedure and laterality before marking the site Procedure checklist: Completed Consent: Before the procedure  and under the influence of no sedative(s), amnesic(s), or anxiolytics, the patient was informed of the treatment options, risks and possible complications. To fulfill our ethical and legal obligations, as recommended by the American Medical Association's Code of Ethics, I have informed the patient of my clinical impression; the nature and purpose of  the treatment or procedure; the risks, benefits, and possible complications of the intervention; the alternatives, including doing nothing; the risk(s) and benefit(s) of the alternative treatment(s) or procedure(s); and the risk(s) and benefit(s) of doing nothing. The patient was provided information about the general risks and possible complications associated with the procedure. These may include, but are not limited to: failure to achieve desired goals, infection, bleeding, organ or nerve damage, allergic reactions, paralysis, and death. In addition, the patient was informed of those risks and complications associated to Spine-related procedures, such as failure to decrease pain; infection (i.e.: Meningitis, epidural or intraspinal abscess); bleeding (i.e.: epidural hematoma, subarachnoid hemorrhage, or any other type of intraspinal or peri-dural bleeding); organ or nerve damage (i.e.: Any type of peripheral nerve, nerve root, or spinal cord injury) with subsequent damage to sensory, motor, and/or autonomic systems, resulting in permanent pain, numbness, and/or weakness of one or several areas of the body; allergic reactions; (i.e.: anaphylactic reaction); and/or death. Furthermore, the patient was informed of those risks and complications associated with the medications. These include, but are not limited to: allergic reactions (i.e.: anaphylactic or anaphylactoid reaction(s)); adrenal axis suppression; blood sugar elevation that in diabetics may result in ketoacidosis or comma; water retention that in patients with history of congestive heart failure may result  in shortness of breath, pulmonary edema, and decompensation with resultant heart failure; weight gain; swelling or edema; medication-induced neural toxicity; particulate matter embolism and blood vessel occlusion with resultant organ, and/or nervous system infarction; and/or aseptic necrosis of one or more joints. Finally, the patient was informed that Medicine is not an exact science; therefore, there is also the possibility of unforeseen or unpredictable risks and/or possible complications that may result in a catastrophic outcome. The patient indicated having understood very clearly. We have given the patient no guarantees and we have made no promises. Enough time was given to the patient to ask questions, all of which were answered to the patient's satisfaction. Ms. Searles has indicated that she wanted to continue with the procedure. Attestation: I, the ordering provider, attest that I have discussed with the patient the benefits, risks, side-effects, alternatives, likelihood of achieving goals, and potential problems during recovery for the procedure that I have provided informed consent. Date  Time: 02/06/2023  9:21 AM   Pre-Procedure Preparation:  Monitoring: As per clinic protocol. Respiration, ETCO2, SpO2, BP, heart rate and rhythm monitor placed and checked for adequate function Safety Precautions: Patient was assessed for positional comfort and pressure points before starting the procedure. Time-out: I initiated and conducted the "Time-out" before starting the procedure, as per protocol. The patient was asked to participate by confirming the accuracy of the "Time Out" information. Verification of the correct person, site, and procedure were performed and confirmed by me, the nursing staff, and the patient. "Time-out" conducted as per Joint Commission's Universal Protocol (UP.01.01.01). Time: 1018 Start Time: 1018 hrs.  Description  Narrative of Procedure:          Rationale (medical  necessity): procedure needed and proper for the diagnosis and/or treatment of the patient's medical symptoms and needs. Start Time: 1018 hrs. Safety Precautions: Aspiration looking for blood return was conducted prior to all injections. At no point did we inject any substances, as a needle was being advanced. No attempts were made at seeking any paresthesias. Safe injection practices and needle disposal techniques used. Medications properly checked for expiration dates. SDV (single dose vial) medications used. Description of procedure: Protocol guidelines were followed. The patient was  assisted into a comfortable position. The target area was identified and the area prepped in the usual manner. Skin & deeper tissues infiltrated with local anesthetic. Appropriate amount of time allowed to pass for local anesthetics to take effect. Using fluoroscopic guidance, the epidural needle was introduced through the skin, ipsilateral to the reported pain, and advanced to the target area. Posterior laminar os was contacted and the needle walked caudad, until the lamina was cleared. The ligamentum flavum was engaged and the epidural space identified using "loss-of-resistance technique" with 2-3 ml of PF-NaCl (0.9% NSS), in a 5cc dedicated LOR syringe. (See "Imaging guidance" below for use of contrast details.) Once proper needle placement was secured, and negative aspiration confirmed, the solution was injected in intermittent fashion, asking for systemic symptoms every 0.5cc. The needles were then removed and the area cleansed, making sure to leave some of the prepping solution back to take advantage of its long term bactericidal properties.  4 cc solution made of 1 cc of preservative-free saline, 2 cc of 0.2% ropivacaine, 1 cc of Decadron 10 mg/cc.   Vitals:   02/06/23 1000 02/06/23 1014 02/06/23 1019 02/06/23 1023  BP:  (!) 107/49 118/61 (!) 117/58  Pulse:      Resp: 12 12 (!) 22 (!) 22  Temp:      TempSrc:       SpO2:  100% 100% 100%  Weight:      Height:         End Time: 1023 hrs.  Imaging Guidance (Spinal):          Type of Imaging Technique: Fluoroscopy Guidance (Spinal) Indication(s): Assistance in needle guidance and placement for procedures requiring needle placement in or near specific anatomical locations not easily accessible without such assistance. Exposure Time: Please see nurses notes. Contrast: Before injecting any contrast, we confirmed that the patient did not have an allergy to iodine, shellfish, or radiological contrast. Once satisfactory needle placement was completed at the desired level, radiological contrast was injected. Contrast injected under live fluoroscopy. No contrast complications. See chart for type and volume of contrast used. Fluoroscopic Guidance: I was personally present during the use of fluoroscopy. "Tunnel Vision Technique" used to obtain the best possible view of the target area. Parallax error corrected before commencing the procedure. "Direction-depth-direction" technique used to introduce the needle under continuous pulsed fluoroscopy. Once target was reached, antero-posterior, oblique, and lateral fluoroscopic projection used confirm needle placement in all planes. Images permanently stored in EMR. Interpretation: I personally interpreted the imaging intraoperatively. Adequate needle placement confirmed in multiple planes. Appropriate spread of contrast into desired area was observed. No evidence of afferent or efferent intravascular uptake. No intrathecal or subarachnoid spread observed. Permanent images saved into the patient's record.  Post-operative Assessment:  Post-procedure Vital Signs:  Pulse/HCG Rate: (!) 57(!) 58 Temp: 97.9 F (36.6 C) Resp: (!) 22 BP: (!) 117/58 SpO2: 100 %  EBL: None  Complications: No immediate post-treatment complications observed by team, or reported by patient.  Note: The patient tolerated the entire procedure well. A  repeat set of vitals were taken after the procedure and the patient was kept under observation following institutional policy, for this type of procedure. Post-procedural neurological assessment was performed, showing return to baseline, prior to discharge. The patient was provided with post-procedure discharge instructions, including a section on how to identify potential problems. Should any problems arise concerning this procedure, the patient was given instructions to immediately contact us, at any time, without hesitation. In any case,  we plan to contact the patient by telephone for a follow-up status report regarding this interventional procedure.  Comments:  No additional relevant information.  Plan of Care (POC)  Orders:  Orders Placed This Encounter  Procedures   DG PAIN CLINIC C-ARM 1-60 MIN NO REPORT    Intraoperative interpretation by procedural physician at [facility].    Standing Status:   Standing    Number of Occurrences:   1    Order Specific Question:   Reason for exam:    Answer:   Assistance in needle guidance and placement for procedures requiring needle placement in or near specific anatomical locations not easily accessible without such assistance.     Medications ordered for procedure: Meds ordered this encounter  Medications   iohexol (OMNIPAQUE) 180 MG/ML injection 10 mL    Must be Myelogram-compatible. If not available, you may substitute with a water-soluble, non-ionic, hypoallergenic, myelogram-compatible radiological contrast medium.   lidocaine (XYLOCAINE) 2 % (with pres) injection 400 mg   diazepam (VALIUM) tablet 5 mg    Make sure Flumazenil is available in the pyxis when using this medication. If oversedation occurs, administer 0.2 mg IV over 15 sec. If after 45 sec no response, administer 0.2 mg again over 1 min; may repeat at 1 min intervals; not to exceed 4 doses (1 mg)   ropivacaine (PF) 2 mg/mL (0.2%) (NAROPIN) injection 1 mL   sodium  chloride flush (NS) 0.9 % injection 1 mL   dexamethasone (DECADRON) injection 10 mg   Medications administered: We administered iohexol, lidocaine, diazepam, ropivacaine (PF) 2 mg/mL (0.2%), sodium chloride flush, and dexamethasone.  See the medical record for exact dosing, route, and time of administration.  Follow-up plan:   Return in about 4 weeks (around 03/06/2023) for Post Procedure Evaluation, virtual.       Future interventional options: -Right cervical ESI 12/14/22 -Cervical facet medial branch nerve blocks -Glenohumeral joint injection -Suprascapular nerve block  Also consider trial of Lyrica     Recent Visits Date Type Provider Dept  01/11/23 Office Visit Edward Jolly, MD [facility]  12/14/22 Procedure visit Edward Jolly, MD [facility]  Showing recent visits within past 90 days and meeting all other requirements Today's Visits Date Type Provider Dept  02/06/23 Procedure visit Edward Jolly, MD [facility]  Showing today's visits and meeting all other requirements Future Appointments Date Type Provider Dept  03/06/23 Appointment Edward Jolly, MD [facility]  Showing future appointments within next 90 days and meeting all other requirements  Disposition: Discharge home  Discharge (Date  Time): 02/06/2023; 1030 hrs.   Primary Care Physician: Eden Emms, NP Location: [facility] Note by: Edward Jolly, MD (TTS technology used. I apologize for any typographical errors that were not detected and corrected.) Date: 02/06/2023; Time: 10:36 AM  Disclaimer:  Medicine is not an Visual merchandiser. The only guarantee in medicine is that nothing is guaranteed. It is important to note that the decision to proceed with this intervention was based on the information collected from the patient. The Data and conclusions were drawn from the patient's questionnaire, the interview, and the physical examination.  Because the information was provided in large part by the patient, it cannot be guaranteed that it has not been purposely or unconsciously manipulated. Every effort has been made to obtain as much relevant data as possible for this evaluation. It is important to note that the conclusions that lead to this procedure are  derived in large part from the available data. Always take into account that the treatment will also be dependent on availability of resources and existing treatment guidelines, considered by other Pain Management Practitioners as being common knowledge and practice, at the time of the intervention. For Medico-Legal purposes, it is also important to point out that variation in procedural techniques and pharmacological choices are the acceptable norm. The indications, contraindications, technique, and results of the above procedure should only be interpreted and judged by a Board-Certified Interventional Pain Specialist with extensive familiarity and expertise in the same exact procedure and technique.

## 2023-02-06 NOTE — Progress Notes (Signed)
Safety precautions to be maintained throughout the outpatient stay will include: orient to surroundings, keep bed in low position, maintain call bell within reach at all times, provide assistance with transfer out of bed and ambulation.  

## 2023-02-06 NOTE — Patient Instructions (Signed)

## 2023-02-07 ENCOUNTER — Telehealth: Payer: Self-pay

## 2023-02-07 NOTE — Telephone Encounter (Signed)
Post procedure follow up..  Patient states he is doing good 

## 2023-02-08 ENCOUNTER — Ambulatory Visit: Payer: Medicare HMO | Admitting: Physician Assistant

## 2023-02-08 DIAGNOSIS — N3946 Mixed incontinence: Secondary | ICD-10-CM | POA: Diagnosis not present

## 2023-02-08 NOTE — Progress Notes (Signed)
PTNS  Session # 13  Health & Social Factors: No change Caffeine: 2 Alcohol: 0 Daytime voids #per day: 3-4 Night-time voids #per night: 1 Urgency: Strong Incontinence Episodes #per day: 3-4 Ankle used: right Treatment Setting: 5 Feeling/ Response: sensory Comments: patient Tolerated   Performed By: Peggyann Juba ,PA-C  Follow Up:  # 14 in 1 month

## 2023-02-08 NOTE — Patient Instructions (Signed)

## 2023-02-09 ENCOUNTER — Other Ambulatory Visit: Payer: Self-pay | Admitting: Nurse Practitioner

## 2023-02-09 DIAGNOSIS — G479 Sleep disorder, unspecified: Secondary | ICD-10-CM

## 2023-02-10 ENCOUNTER — Telehealth: Payer: Self-pay

## 2023-02-10 ENCOUNTER — Telehealth (INDEPENDENT_AMBULATORY_CARE_PROVIDER_SITE_OTHER): Payer: Self-pay

## 2023-02-10 ENCOUNTER — Other Ambulatory Visit (HOSPITAL_COMMUNITY): Payer: Self-pay

## 2023-02-10 NOTE — Telephone Encounter (Signed)
Per plan patient cannot take Hydroxyzine with one or more additional anticholinergic medication.   Plan will not allow patient to fill because they are also on Oxybutynin.

## 2023-02-10 NOTE — Telephone Encounter (Signed)
I called pt and let her know that her CT has been approved and she can call radiology scheduling to make that appt and then call our office back to schedule appt to see Dr Gilda Crease for results in office.  Pt states understanding

## 2023-02-10 NOTE — Telephone Encounter (Signed)
Pharmacy Patient Advocate Encounter   Received notification from CoverMyMeds that prior authorization for HYDROXYZINE PAMOATE is required/requested.   Insurance verification completed.   The patient is insured through [employer] [facility] .   Per test claim: PA required; PA submitted to [employer] [facility] via CoverMyMeds Key/confirmation #/[id number] Status is pending

## 2023-02-10 NOTE — Telephone Encounter (Signed)
Can we let the patient know that the insurance will not pay for the hydroxyzine because she is on ditropan. She can see what the cash price is out of pocket though

## 2023-02-13 NOTE — Telephone Encounter (Signed)
Called patient reviewed all information and repeated back to me. Will call if any questions.  Verified with patient on Good rx at her pharmacy price should be $6.74. if more than that she will let us know and we will call pharmacy and see what issue is.

## 2023-02-14 ENCOUNTER — Ambulatory Visit
Admission: RE | Admit: 2023-02-14 | Discharge: 2023-02-14 | Disposition: A | Discharge status: Home / Self Care | Payer: Medicare HMO | Source: Ambulatory Visit | Attending: Vascular Surgery | Admitting: Vascular Surgery

## 2023-02-14 DIAGNOSIS — R200 Anesthesia of skin: Secondary | ICD-10-CM | POA: Diagnosis not present

## 2023-02-14 DIAGNOSIS — I6509 Occlusion and stenosis of unspecified vertebral artery: Secondary | ICD-10-CM | POA: Diagnosis not present

## 2023-02-14 DIAGNOSIS — I708 Atherosclerosis of other arteries: Secondary | ICD-10-CM | POA: Diagnosis not present

## 2023-02-14 DIAGNOSIS — I6523 Occlusion and stenosis of bilateral carotid arteries: Secondary | ICD-10-CM | POA: Insufficient documentation

## 2023-02-14 MED ORDER — IOHEXOL 350 MG/ML SOLN
75.0000 mL | Freq: Once | INTRAVENOUS | Status: AC | PRN
  Administered 2023-02-14: 75 mL via INTRAVENOUS

## 2023-02-15 ENCOUNTER — Encounter (INDEPENDENT_AMBULATORY_CARE_PROVIDER_SITE_OTHER): Payer: Self-pay

## 2023-02-15 ENCOUNTER — Encounter: Payer: Medicare HMO | Admitting: *Deleted

## 2023-02-15 DIAGNOSIS — J441 Chronic obstructive pulmonary disease with (acute) exacerbation: Secondary | ICD-10-CM

## 2023-02-15 DIAGNOSIS — Z006 Encounter for examination for normal comparison and control in clinical research program: Secondary | ICD-10-CM

## 2023-02-15 MED ORDER — STUDY - ARNASA GB44332 - ASTEGOLIMAB 238 MG/1.7 ML OR PLACEBO SQ INJECTION (PI-RAMASWAMY)
476.0000 mg | INJECTION | SUBCUTANEOUS | Status: DC
  Administered 2023-02-15: 476 mg via SUBCUTANEOUS
  Filled 2023-02-15: qty 3.4

## 2023-02-15 NOTE — Research (Cosign Needed Addendum)
Title: A Phase III, randomized, double-blind, placebo controlled, multicenter study to evaluate the efficacy and safety of astegolimab in patients with chronic obstructive pulmonary disease    Dose and Duration of Treatment: Astegolimab is presented as a sterile, slightly brown-yellow solution. Each single-use, 2.25 mL pre-filled syringe contains 1.7 mL deliverable volume. Astegolimab drug product is formulated at 140 mg/mL astegolimab with 114 mM succinic acid, 200 mM L-arginine, 10 mM L-methionine, 0.06% (w/v) polysorbate 20, pH 5.7. Placebo for astegolimab is supplied in an identical pre-filled syringe configuration.   Protocol # [id number]   Sponsor: [employer] [address], [place]   Protocol: Version 3, dated 26/May2023, Version 4 dated 20Jun2023 IB: version 9.0 dated April 2024 ICF: Main version 12Apr2023, revised 17July2023 [facility] 01Sep2023, revised 01Sep2023 Lab Manual: V4.0.0 20Oct2023    Investigator Brochure Product: Astegolimab [phone])   Mechanism of action: Astegolimab (also known as [id number] or [id number]) is a fully human, IgG2 monoclonal antibody that binds with high affinity to the interleukin (IL)-33 (IL-33) receptor, ST2, thereby blocking the signaling of IL-33, an inflammatory cytokine of the IL-1 family and member of the "alarmin" class of molecules. Astegolimab binds with high affinity to the human and cynomolgus monkey receptor for IL-33, ST2, and blocks IL-33 binding, thus inhibiting association with the IL-1R accessory protein (AcP) co-receptor and formation of an activated receptor complex.      Integrated Pharmacokinetic/Pharmacodynamic Analysis:  In Studies [id number], [id number], and [id number], exploratory biomarker analysis showed there were consistent decreases in blood eosinophil counts throughout the treatment period, potentially mediated by a direct effect of IL-33 on eosinophil progenitors. In [place], there was no  significant difference in fractional exhaled nitric oxide (FeNO) levels (reflecting airway IL-4/IL-13 activity) between astegolimab-treated groups relative to placebo throughout the treatment period. These data suggest that astegolimab has only a limited effect on Type 2 inflammation in asthma. No data are applicable for Study [id number]. No data are available yet for Study [id number].  Special Warnings/Considerations: Administration of astegolimab, a protein therapeutic, may lead to the development of anti-astegolimab antibodies which could lead to AEs and/or decreased exposure. In non-clinical studies (see Section 4.2), ADA incidence has generally been low and there was no apparent ADA impact on PK and safety in these studies. To date, the immunogenicity rates observed with astegolimab in clinical studies have been relatively low as well (see Immunogenicity Section 5.6). Several clinical studies have been conducted in patients with asthma, atopic dermatitis, COPD (IIS Study [id number]) and COVID-19 severe pneumonia, which generally showed low incidence of ADAs (Table 31). There has been no correlation between ADA status and clinical findings or increased incidence of AEs. Astegolimab is now being considered in a larger study for the treatment of COPD. This patient population is typically considered to have a hyper-responsive immune system. Route of administration for this molecule will be [place], either Q2W or Q4W. These factors increase the risk of development of an immune response to astegolimab, specifically with repeat dosing. From the previous IIS Study [id number], incidence of ADAs was low in COPD patients; this remains to be confirmed in a larger study. To monitor ADA development in ongoing studies, serum samples will be collected from patients at protocol-defined intervals. Patients who test positive for antibodies and have clinical sequelae that are considered potentially related to an ADA response may also be asked  to return for additional follow-up testing.  Drug Interaction Studies: No PK drug interaction studies have been conducted to date.  Serious Adverse Reactions  Observed in Asthma Study: During the treatment period, a similar proportion of patients across all cohorts experienced at least one AE, regardless of causality (Table 22). In total, 77.2%, 70.9%, 72.2%, and 72.1% of patients reported at least 1 AE in the placebo, 70 mg, 210 mg, and 490 mg groups, respectively. The most common AE's (>5% in any treatment group) were asthma, nasopharyngitis, upper respiratory tract infection, headache, and injection site reaction (ISR). The most common drug-related AE was ISR, which was reported more frequently in the astegolimab treatment groups than in the placebo group (1 [0.8%] patient in the placebo group, 10 [7.9%] patients with 70 mg, 8 [6.3%] patients with 210 mg, 6 [4.9%] patients with 490 mg). All ISRs were non-serious and mild or moderate in severity. During the treatment period, 50 SAEs were reported in 37 (7.4%) patients. The number of patients reporting SAEs was comparable across all cohorts (11 SAEs in 8 patients on placebo, 21 SAEs in 14 patients on 70 mg, 11 SAEs in 9 patients on 210 mg, and 7 SAEs in 6 patients on 490 mg). The most common SAE was asthma. One SAE of moderate livedo reticularis (70 mg) was considered a suspected unexpected serious adverse drug reaction (SUSAR) related to astegolimab and was reported two days after the second dose, leading to discontinuation of astegolimab. Two (0.4%) patients reported anaphylaxis and hypersensitivity reactions: 1 severe SAE of anaphylactic reaction (placebo), and 1 moderate hypersensitivity (490 mg) considered related to astegolimab. Three (0.6%) patients experienced a potential Major Adverse Cardiac Event (MACE) (1 patient each in the placebo, 70 mg, and 210 mg groups). None of the potential MACE were considered related to astegolimab. Overall, 233 (46.4%)  patients reported events of infection. A comparable number of patients reported infection across all treatment groups (65 [51.2%] patients on placebo, 55 [43.3%] patients on 70 mg, 58 [46.0%] patients on 210 mg, and 55 [45.1%] patients on 490 mg). The most frequently reported infection (?10% incidence) was nasopharyngitis (12.7%). One patient on astegolimab 210 mg and the partner of one patient on placebo became pregnant during the study. Both delivered normal/healthy babies. Two deaths, unrelated to study drug, were reported: one patient on 210 mg astegolimab died following an SAE of asthma; the other patient on 490 mg astegolimab had an unexplained death. There were no clinically meaningful changes in laboratory parameters, vital signs, or ECG results, other than the 10% decrease in mean blood eosinophil counts in the astegolimab-treated groups, with no safety concerns. Treatment-induced ADAs were comparable between the astegolimab groups and had no impact on safety. Overall, astegolimab was well tolerated at all doses used and had a safety profile consistent with that observed in the previous astegolimab Phase I studies.  Serious Adverse Reactions Observed in Previous COPD Study: In the completed IIS Study [id number], a total of 81 patients received at least one dose of astegolimab or placebo. The safety profile of astegolimab was similar to that of placebo. There were a total of 222 AEs reported in 62 patients. A total of 28 (72%) patients in the placebo arm and 34 (81%) patients in the astegolimab arm reported at least one AE. The most commonly reported AEs were headache followed by urinary tract infection and viral upper respiratory tract infection. A total of 39 SAEs were reported in 28 (35%) patients; 16 (41%) patients in the placebo group and 12 (29%) in the astegolimab group. The most commonly reported SAE was hospital admission for community acquired pneumonia. Four SAEs resulted in patient  discontinuation  from treatment (1 patient in the placebo group and 3 patients in the astegolimab group). One patient in the placebo group experienced an AESI of potential MACE (heart failure, unrelated to the study treatment). No anaphylaxis or pregnancies were reported. Two deaths, unrelated to study treatment, were reported: 1 patient in the placebo group died after hospital acquired pneumonia, and another patient in the placebo group died after pneumonia and type 2 respiratory failure.  Safety Data: Astegolimab has been generally well tolerated. There have been 49 patient deaths across all astegolimab studies (Section 5.5.2), none of which were considered related to astegolimab. A total of 144 subjects experienced a total of 224 SAEs across all astegolimab studies. Of these, an SAE livedo reticularis observed in 1 patient was considered related to astegolimab by the investigator (Section 5.5.3). AEs leading to withdrawal (Section 5.5.4) have generally occurred at a rate similar to what would be expected for clinical trials in the studies' respective indications.   [facility] Clinical Research Coordinator note :    This visit for Subject [id number] with DOB: [birth year] on 15 Feb 2023 for the above protocol is Visit/Encounter #  12 and is for purpose of research.    The consent for this encounter is under Protocol Version 2.0 , Investigator Brochure Version 7, Consent Version IRB Approved  revised  and is currently IRB approved.    Subject expressed continued interest and consent in continuing as a study subject. Subject confirmed that there was no change in contact information (e.g. address, telephone, email). Subject thanked for participation in research and contribution to science. The Subject was informed that the PI Dr. Marchelle Gearing continues to have oversight of the subject's visits and course  through relevant discussions, reviews and also specifically of this visit by routing of this note to  the PI.  All procedures and assessments were performed per above stated protocol.  Subject tolerated injections well.  Please refer to subjects paper binder source for further details of this visit.    Signed by Jerolyn Shin, BA,CMA Clinical Research Coordinator  [facility]  [place], [place]

## 2023-02-16 ENCOUNTER — Other Ambulatory Visit: Payer: Self-pay | Admitting: Internal Medicine

## 2023-02-22 DIAGNOSIS — J449 Chronic obstructive pulmonary disease, unspecified: Secondary | ICD-10-CM | POA: Diagnosis not present

## 2023-02-22 DIAGNOSIS — J9611 Chronic respiratory failure with hypoxia: Secondary | ICD-10-CM | POA: Diagnosis not present

## 2023-02-22 NOTE — H&P (View-Only) (Signed)
 MRN : [id number]  Maria [name] is a 67 y.o. (05/28/1956) female who presents with chief complaint of check carotid arteries.  History of Present Illness:   The patient is seen for follow up evaluation of carotid stenosis status post CT angiogram. CT scan was done 02/14/2023. Patient reports that the test went well with no problems or complications.   The patient does describe visual changes of the left eye.  She notes that they have occurred several times once just a few weeks ago. There is no recent or interval TIA symptoms or focal motor deficits. There is no prior documented CVA.  The patient is taking enteric-coated aspirin 81 mg daily.  There is no history of migraine headaches. There is no history of seizures.  No recent shortening of the patient's walking distance or new symptoms consistent with claudication.  No history of rest pain symptoms. No new ulcers or wounds of the lower extremities have occurred.  There is no history of DVT, PE or superficial thrombophlebitis. No recent episodes of angina or shortness of breath documented.   CT angiogram is reviewed by me personally and shows greater than 90% moderate to severely calcified left internal carotid artery stenosis at the origin of the left internal carotid artery.  Bovine arch anatomy is noted.  It appears to be a type I to type II arch.  Also of note it is a very high lesion.  No outpatient medications have been marked as taking for the 02/23/23 encounter (Appointment) with Gilda Crease, Latina Craver, MD.   Current Facility-Administered Medications for the 02/23/23 encounter (Appointment) with Gilda Crease, Latina Craver, MD  Medication   Study - ARNASA [phone] - astegolimab 238 mg/1.7 mL or placebo SQ injection (PI-Ramaswamy)    Past Medical History:  Diagnosis Date   Atherosclerosis 1/0612014   aorta, iliacs and CFA bilaterally no greater than 0-49% - lower arterial doppler.   Blood transfusion without reported  diagnosis 2005   Cancer [facility]) Ovarian 1978   Chest pain    COPD (chronic obstructive pulmonary disease) (HCC)    COVID-19 long hauler manifesting chronic dyspnea    Emphysema of lung (HCC) 2019   Oxygen deficiency 03/2019   Oxygen dependent    3 liters up to 5    Past Surgical History:  Procedure Laterality Date   ABDOMINAL HYSTERECTOMY  1978   CHOLECYSTECTOMY  2010   COLONOSCOPY     COLONOSCOPY     COLONOSCOPY WITH PROPOFOL N/A 11/01/2021   Procedure: COLONOSCOPY WITH PROPOFOL;  Surgeon: Wyline Mood, MD;  Location: [facility] ENDOSCOPY;  Service: Gastroenterology;  Laterality: N/A;   ESOPHAGOGASTRODUODENOSCOPY     NECK SURGERY  1999   OVARIAN CYST REMOVAL  1988   SPINE SURGERY  Neck 2002    Social History Social History   Tobacco Use   Smoking status: Former    Current packs/day: 0.00    Average packs/day: 1 pack/day for 45.0 years (45.0 ttl pk-yrs)    Types: Cigarettes    Start date: 03/19/1971    Quit date: 03/18/2016    Years since quitting: 6.9    Passive exposure: Past   Smokeless tobacco: Never  Vaping Use   Vaping status: Never Used  Substance Use Topics   Alcohol use: No    Alcohol/week: 0.0 standard drinks of alcohol   Drug use: No    Family  History Family History  Problem Relation Age of Onset   Heart failure Mother    Diabetes Mother    Kidney disease Mother    Hypertension Mother    Arthritis Mother    Heart disease Mother    Obesity Mother    COPD Father    Hearing loss Father    Lung cancer Brother    COPD Brother    COPD Brother    Asthma Son    Diabetes Son    Birth defects Son    Breast cancer Maternal Aunt 40    Allergies  Allergen Reactions   Montelukast     Bad dreams     REVIEW OF SYSTEMS (Negative unless checked)  Constitutional: [] Weight loss  [] Fever  [] Chills Cardiac: [] Chest pain   [] Chest pressure   [] Palpitations   [] Shortness of breath when laying flat   [] Shortness of breath with exertion. Vascular:  [x] Pain in legs  with walking   [] Pain in legs at rest  [] History of DVT   [] Phlebitis   [] Swelling in legs   [] Varicose veins   [] Non-healing ulcers Pulmonary:   [] Uses home oxygen   [] Productive cough   [] Hemoptysis   [] Wheeze  [] COPD   [] Asthma Neurologic:  [] Dizziness   [] Seizures   [] History of stroke   [] History of TIA  [] Aphasia   [] Vissual changes   [] Weakness or numbness in arm   [] Weakness or numbness in leg Musculoskeletal:   [] Joint swelling   [] Joint pain   [] Low back pain Hematologic:  [] Easy bruising  [] Easy bleeding   [] Hypercoagulable state   [] Anemic Gastrointestinal:  [] Diarrhea   [] Vomiting  [] Gastroesophageal reflux/heartburn   [] Difficulty swallowing. Genitourinary:  [] Chronic kidney disease   [] Difficult urination  [] Frequent urination   [] Blood in urine Skin:  [] Rashes   [] Ulcers  Psychological:  [] History of anxiety   []  History of major depression.  Physical Examination  There were no vitals filed for this visit. There is no height or weight on file to calculate BMI. Gen: WD/WN, NAD Head: [place]/AT, No temporalis wasting.  Ear/Nose/Throat: Hearing grossly intact, nares w/o erythema or drainage Eyes: PER, EOMI, sclera nonicteric.  Neck: Supple, no masses.  No bruit or JVD.  Pulmonary:  Good air movement, no audible wheezing, no use of accessory muscles.  Cardiac: RRR, normal S1, S2, no Murmurs. Vascular:  carotid bruit noted Vessel Right Left  Radial Palpable Palpable  Carotid  Palpable  Palpable  Subclav  Palpable Palpable  Gastrointestinal: soft, non-distended. No guarding/no peritoneal signs.  Musculoskeletal: M/S 5/5 throughout.  No visible deformity.  Neurologic: CN 2-12 intact. Pain and light touch intact in extremities.  Symmetrical.  Speech is fluent. Motor exam as listed above. Psychiatric: Judgment intact, Mood & affect appropriate for pt's clinical situation. Dermatologic: No rashes or ulcers noted.  No changes consistent with cellulitis.   CBC Lab Results  Component  Value Date   WBC 10.8 (H) 09/01/2022   HGB 12.4 09/01/2022   HCT 37.7 09/01/2022   MCV 94.7 09/01/2022   PLT 328.0 09/01/2022    BMET    Component Value Date/Time   NA 139 09/01/2022 0957   K 4.0 09/01/2022 0957   CL 103 09/01/2022 0957   CO2 29 09/01/2022 0957   GLUCOSE 85 09/01/2022 0957   BUN 13 09/01/2022 0957   CREATININE 0.65 09/01/2022 0957   CALCIUM 9.6 09/01/2022 0957   GFRNONAA >60 03/03/2019 0154   GFRAA >60 03/03/2019 0154   CrCl cannot be  calculated (Patient's most recent lab result is older than the maximum 21 days allowed.).  COAG Lab Results  Component Value Date   INR 1.0 10/03/2008   INR 1.0 10/02/2008    Radiology VAS US CAROTID  Result Date: 02/08/2023 Carotid Arterial Duplex Study Patient Name:  Maria [name]  Date of Exam:   01/30/2023 Medical Rec #: [id number]       Accession #:    [id number] Date of Birth: 1955/10/28        Patient Gender: F Patient Age:   66 years Exam Location:  [facility] Procedure:      VAS US CAROTID Referring Phys: Levora Dredge --------------------------------------------------------------------------------  Indications:   Carotid artery disease. Other Factors: History of right vertebral retrograde. Performing Technologist: Salvadore Farber RVT  Examination Guidelines: A complete evaluation includes B-mode imaging, spectral Doppler, color Doppler, and power Doppler as needed of all accessible portions of each vessel. Bilateral testing is considered an integral part of a complete examination. Limited examinations for reoccurring indications may be performed as noted.  Right Carotid Findings: +----------+--------+--------+--------+-------------------+--------+           PSV cm/sEDV cm/sStenosisPlaque Description Comments +----------+--------+--------+--------+-------------------+--------+ CCA Prox  94      16                                           +----------+--------+--------+--------+-------------------+--------+ CCA Mid   94      22                                          +----------+--------+--------+--------+-------------------+--------+ CCA Distal81      21                                          +----------+--------+--------+--------+-------------------+--------+ ICA Prox  99      24                                          +----------+--------+--------+--------+-------------------+--------+ ICA Mid   110     21      1-39%   calcific and smooth         +----------+--------+--------+--------+-------------------+--------+ ICA Distal83      22                                          +----------+--------+--------+--------+-------------------+--------+ ECA       80      9                                           +----------+--------+--------+--------+-------------------+--------+ +----------+--------+-------+--------+-------------------+           PSV cm/sEDV cmsDescribeArm Pressure (mmHG) +----------+--------+-------+--------+-------------------+ [id number]            Stenotic                    +----------+--------+-------+--------+-------------------+ +---------+--------+--+--------+----------+ VertebralPSV  cm/s66EDV cm/sRetrograde +---------+--------+--+--------+----------+  Left Carotid Findings: +----------+--------+--------+--------+----------------------+--------+           PSV cm/sEDV cm/sStenosisPlaque Description    Comments +----------+--------+--------+--------+----------------------+--------+ CCA Prox  83      17                                             +----------+--------+--------+--------+----------------------+--------+ CCA Mid   93      21                                             +----------+--------+--------+--------+----------------------+--------+ CCA Distal126     30                                              +----------+--------+--------+--------+----------------------+--------+ ICA Prox  323     63      60-79%  calcific and irregular         +----------+--------+--------+--------+----------------------+--------+ ICA Mid   210     51                                             +----------+--------+--------+--------+----------------------+--------+ ICA Distal119     34                                             +----------+--------+--------+--------+----------------------+--------+ ECA       204     24                                             +----------+--------+--------+--------+----------------------+--------+ +----------+--------+--------+----------------+-------------------+           PSV cm/sEDV cm/sDescribe        Arm Pressure (mmHG) +----------+--------+--------+----------------+-------------------+ [id number]             Multiphasic, WNL                    +----------+--------+--------+----------------+-------------------+ +---------+--------+---+--------+--+---------+ VertebralPSV cm/s140EDV cm/s31Antegrade +---------+--------+---+--------+--+---------+   Summary: Right Carotid: Velocities in the right ICA are consistent with a 1-39% stenosis.                Non-hemodynamically significant plaque <50% noted in the CCA. The                ECA appears <50% stenosed. Left Carotid: Velocities in the left ICA are consistent with a 60-79% stenosis.               Non-hemodynamically significant plaque <50% noted in the CCA. The               ECA appears >50% stenosed.  *See table(s) above for measurements and observations.  Electronically signed by Levora Dredge MD on 02/08/2023 at 4:22:56 PM.    Final    DG PAIN CLINIC C-ARM 1-60 MIN NO REPORT  Result Date:  02/06/2023 Fluoro was used, but no Radiologist interpretation will be provided. Please refer to "NOTES" tab for provider progress note.    Assessment/Plan 1. Bilateral carotid artery  stenosis Recommend:  The patient is symptomatic with respect to the carotid stenosis.  The patient now has progressed and has a lesion the is >70%.  I have completed Shared Decision-Making with Maria Stelmack Hallprior to carotid surgery/intervention. The conversation included: -Discussion of all treatment options including carotid endarterectomy (CEA), carotid artery stenting (which includes transcarotid artery revascularization (TCAR)), and optimal medical therapy (OMT). -Explanation of risks and benefits for each option specific to Maria [name] clinical situation. -Integration of clinical guidelines as it relates to the patient's history and comorbidities. -Discussion and incorporation of Maria [name] and their personal preferences and priorities in choosing a treatment plan plan  If the patient was unable to participate in Shared Decision Making this process was done with the patient and her husband.  Patient's CT angiography of the carotid arteries confirms >90% left ICA stenosis.  The anatomical considerations support stenting over surgery.  This was discussed in detail with the patient.  The risks, benefits and alternative therapies were reviewed in detail with the patient.  All questions were answered.  The patient agrees to proceed with stenting of the left internal carotid artery.  The patient's NIHSS score is as follows: 0 Mild: 1 - 5 Mild to Moderately Severe: 5 - 14 Severe: 15 - 24 Very Severe: >25  Continue antiplatelet therapy as prescribed. Continue management of CAD, HTN and Hyperlipidemia. Healthy heart diet, encouraged exercise at least 4 times per week.   2. Atherosclerosis of native artery of both lower extremities with intermittent claudication (HCC)  Recommend:  The patient has evidence of atherosclerosis of the lower extremities with claudication.  The patient does not voice lifestyle limiting changes at this point in time.  Noninvasive studies do not suggest  clinically significant change.  No invasive studies, angiography or surgery at this time The patient should continue walking and begin a more formal exercise program.  The patient should continue antiplatelet therapy and aggressive treatment of the lipid abnormalities  No changes in the patient's medications at this time  Continued surveillance is indicated as atherosclerosis is likely to progress with time.    The patient will continue follow up with noninvasive studies as ordered.   3. Stage 2 moderate COPD by GOLD classification (HCC) Continue pulmonary medications and aerosols as already ordered, these medications have been reviewed and there are no changes at this time.   4. Cervical disc disorder with radiculopathy of cervical region (right) Continue NSAID medications as already ordered, these medications have been reviewed and there are no changes at this time.  Continued activity and therapy was stressed.  5. Mixed hyperlipidemia Continue statin as ordered and reviewed, no changes at this time    Levora Dredge, MD  02/22/2023 10:54 AM

## 2023-02-22 NOTE — Progress Notes (Signed)
MRN : [id number]  Maria [name] is a 67 y.o. (05/28/1956) female who presents with chief complaint of check carotid arteries.  History of Present Illness:   The patient is seen for follow up evaluation of carotid stenosis status post CT angiogram. CT scan was done 02/14/2023. Patient reports that the test went well with no problems or complications.   The patient does describe visual changes of the left eye.  She notes that they have occurred several times once just a few weeks ago. There is no recent or interval TIA symptoms or focal motor deficits. There is no prior documented CVA.  The patient is taking enteric-coated aspirin 81 mg daily.  There is no history of migraine headaches. There is no history of seizures.  No recent shortening of the patient's walking distance or new symptoms consistent with claudication.  No history of rest pain symptoms. No new ulcers or wounds of the lower extremities have occurred.  There is no history of DVT, PE or superficial thrombophlebitis. No recent episodes of angina or shortness of breath documented.   CT angiogram is reviewed by me personally and shows greater than 90% moderate to severely calcified left internal carotid artery stenosis at the origin of the left internal carotid artery.  Bovine arch anatomy is noted.  It appears to be a type I to type II arch.  Also of note it is a very high lesion.  No outpatient medications have been marked as taking for the 02/23/23 encounter (Appointment) with Gilda Crease, Latina Craver, MD.   Current Facility-Administered Medications for the 02/23/23 encounter (Appointment) with Gilda Crease, Latina Craver, MD  Medication   Study - ARNASA [phone] - astegolimab 238 mg/1.7 mL or placebo SQ injection (PI-Ramaswamy)    Past Medical History:  Diagnosis Date   Atherosclerosis 1/0612014   aorta, iliacs and CFA bilaterally no greater than 0-49% - lower arterial doppler.   Blood transfusion without reported  diagnosis 2005   Cancer [facility]) Ovarian 1978   Chest pain    COPD (chronic obstructive pulmonary disease) (HCC)    COVID-19 long hauler manifesting chronic dyspnea    Emphysema of lung (HCC) 2019   Oxygen deficiency 03/2019   Oxygen dependent    3 liters up to 5    Past Surgical History:  Procedure Laterality Date   ABDOMINAL HYSTERECTOMY  1978   CHOLECYSTECTOMY  2010   COLONOSCOPY     COLONOSCOPY     COLONOSCOPY WITH PROPOFOL N/A 11/01/2021   Procedure: COLONOSCOPY WITH PROPOFOL;  Surgeon: Wyline Mood, MD;  Location: [facility] ENDOSCOPY;  Service: Gastroenterology;  Laterality: N/A;   ESOPHAGOGASTRODUODENOSCOPY     NECK SURGERY  1999   OVARIAN CYST REMOVAL  1988   SPINE SURGERY  Neck 2002    Social History Social History   Tobacco Use   Smoking status: Former    Current packs/day: 0.00    Average packs/day: 1 pack/day for 45.0 years (45.0 ttl pk-yrs)    Types: Cigarettes    Start date: 03/19/1971    Quit date: 03/18/2016    Years since quitting: 6.9    Passive exposure: Past   Smokeless tobacco: Never  Vaping Use   Vaping status: Never Used  Substance Use Topics   Alcohol use: No    Alcohol/week: 0.0 standard drinks of alcohol   Drug use: No    Family  History Family History  Problem Relation Age of Onset   Heart failure Mother    Diabetes Mother    Kidney disease Mother    Hypertension Mother    Arthritis Mother    Heart disease Mother    Obesity Mother    COPD Father    Hearing loss Father    Lung cancer Brother    COPD Brother    COPD Brother    Asthma Son    Diabetes Son    Birth defects Son    Breast cancer Maternal Aunt 40    Allergies  Allergen Reactions   Montelukast     Bad dreams     REVIEW OF SYSTEMS (Negative unless checked)  Constitutional: [] Weight loss  [] Fever  [] Chills Cardiac: [] Chest pain   [] Chest pressure   [] Palpitations   [] Shortness of breath when laying flat   [] Shortness of breath with exertion. Vascular:  [x] Pain in legs  with walking   [] Pain in legs at rest  [] History of DVT   [] Phlebitis   [] Swelling in legs   [] Varicose veins   [] Non-healing ulcers Pulmonary:   [] Uses home oxygen   [] Productive cough   [] Hemoptysis   [] Wheeze  [] COPD   [] Asthma Neurologic:  [] Dizziness   [] Seizures   [] History of stroke   [] History of TIA  [] Aphasia   [] Vissual changes   [] Weakness or numbness in arm   [] Weakness or numbness in leg Musculoskeletal:   [] Joint swelling   [] Joint pain   [] Low back pain Hematologic:  [] Easy bruising  [] Easy bleeding   [] Hypercoagulable state   [] Anemic Gastrointestinal:  [] Diarrhea   [] Vomiting  [] Gastroesophageal reflux/heartburn   [] Difficulty swallowing. Genitourinary:  [] Chronic kidney disease   [] Difficult urination  [] Frequent urination   [] Blood in urine Skin:  [] Rashes   [] Ulcers  Psychological:  [] History of anxiety   []  History of major depression.  Physical Examination  There were no vitals filed for this visit. There is no height or weight on file to calculate BMI. Gen: WD/WN, NAD Head: [place]/AT, No temporalis wasting.  Ear/Nose/Throat: Hearing grossly intact, nares w/o erythema or drainage Eyes: PER, EOMI, sclera nonicteric.  Neck: Supple, no masses.  No bruit or JVD.  Pulmonary:  Good air movement, no audible wheezing, no use of accessory muscles.  Cardiac: RRR, normal S1, S2, no Murmurs. Vascular:  carotid bruit noted Vessel Right Left  Radial Palpable Palpable  Carotid  Palpable  Palpable  Subclav  Palpable Palpable  Gastrointestinal: soft, non-distended. No guarding/no peritoneal signs.  Musculoskeletal: M/S 5/5 throughout.  No visible deformity.  Neurologic: CN 2-12 intact. Pain and light touch intact in extremities.  Symmetrical.  Speech is fluent. Motor exam as listed above. Psychiatric: Judgment intact, Mood & affect appropriate for pt's clinical situation. Dermatologic: No rashes or ulcers noted.  No changes consistent with cellulitis.   CBC Lab Results  Component  Value Date   WBC 10.8 (H) 09/01/2022   HGB 12.4 09/01/2022   HCT 37.7 09/01/2022   MCV 94.7 09/01/2022   PLT 328.0 09/01/2022    BMET    Component Value Date/Time   NA 139 09/01/2022 0957   K 4.0 09/01/2022 0957   CL 103 09/01/2022 0957   CO2 29 09/01/2022 0957   GLUCOSE 85 09/01/2022 0957   BUN 13 09/01/2022 0957   CREATININE 0.65 09/01/2022 0957   CALCIUM 9.6 09/01/2022 0957   GFRNONAA >60 03/03/2019 0154   GFRAA >60 03/03/2019 0154   CrCl cannot be  calculated (Patient's most recent lab result is older than the maximum 21 days allowed.).  COAG Lab Results  Component Value Date   INR 1.0 10/03/2008   INR 1.0 10/02/2008    Radiology VAS US CAROTID  Result Date: 02/08/2023 Carotid Arterial Duplex Study Patient Name:  Maria [name]  Date of Exam:   01/30/2023 Medical Rec #: [id number]       Accession #:    [id number] Date of Birth: 1955/10/28        Patient Gender: F Patient Age:   66 years Exam Location:  [facility] Procedure:      VAS US CAROTID Referring Phys: Levora Dredge --------------------------------------------------------------------------------  Indications:   Carotid artery disease. Other Factors: History of right vertebral retrograde. Performing Technologist: Salvadore Farber RVT  Examination Guidelines: A complete evaluation includes B-mode imaging, spectral Doppler, color Doppler, and power Doppler as needed of all accessible portions of each vessel. Bilateral testing is considered an integral part of a complete examination. Limited examinations for reoccurring indications may be performed as noted.  Right Carotid Findings: +----------+--------+--------+--------+-------------------+--------+           PSV cm/sEDV cm/sStenosisPlaque Description Comments +----------+--------+--------+--------+-------------------+--------+ CCA Prox  94      16                                           +----------+--------+--------+--------+-------------------+--------+ CCA Mid   94      22                                          +----------+--------+--------+--------+-------------------+--------+ CCA Distal81      21                                          +----------+--------+--------+--------+-------------------+--------+ ICA Prox  99      24                                          +----------+--------+--------+--------+-------------------+--------+ ICA Mid   110     21      1-39%   calcific and smooth         +----------+--------+--------+--------+-------------------+--------+ ICA Distal83      22                                          +----------+--------+--------+--------+-------------------+--------+ ECA       80      9                                           +----------+--------+--------+--------+-------------------+--------+ +----------+--------+-------+--------+-------------------+           PSV cm/sEDV cmsDescribeArm Pressure (mmHG) +----------+--------+-------+--------+-------------------+ [id number]            Stenotic                    +----------+--------+-------+--------+-------------------+ +---------+--------+--+--------+----------+ VertebralPSV  cm/s66EDV cm/sRetrograde +---------+--------+--+--------+----------+  Left Carotid Findings: +----------+--------+--------+--------+----------------------+--------+           PSV cm/sEDV cm/sStenosisPlaque Description    Comments +----------+--------+--------+--------+----------------------+--------+ CCA Prox  83      17                                             +----------+--------+--------+--------+----------------------+--------+ CCA Mid   93      21                                             +----------+--------+--------+--------+----------------------+--------+ CCA Distal126     30                                              +----------+--------+--------+--------+----------------------+--------+ ICA Prox  323     63      60-79%  calcific and irregular         +----------+--------+--------+--------+----------------------+--------+ ICA Mid   210     51                                             +----------+--------+--------+--------+----------------------+--------+ ICA Distal119     34                                             +----------+--------+--------+--------+----------------------+--------+ ECA       204     24                                             +----------+--------+--------+--------+----------------------+--------+ +----------+--------+--------+----------------+-------------------+           PSV cm/sEDV cm/sDescribe        Arm Pressure (mmHG) +----------+--------+--------+----------------+-------------------+ [id number]             Multiphasic, WNL                    +----------+--------+--------+----------------+-------------------+ +---------+--------+---+--------+--+---------+ VertebralPSV cm/s140EDV cm/s31Antegrade +---------+--------+---+--------+--+---------+   Summary: Right Carotid: Velocities in the right ICA are consistent with a 1-39% stenosis.                Non-hemodynamically significant plaque <50% noted in the CCA. The                ECA appears <50% stenosed. Left Carotid: Velocities in the left ICA are consistent with a 60-79% stenosis.               Non-hemodynamically significant plaque <50% noted in the CCA. The               ECA appears >50% stenosed.  *See table(s) above for measurements and observations.  Electronically signed by Levora Dredge MD on 02/08/2023 at 4:22:56 PM.    Final    DG PAIN CLINIC C-ARM 1-60 MIN NO REPORT  Result Date:  02/06/2023 Fluoro was used, but no Radiologist interpretation will be provided. Please refer to "NOTES" tab for provider progress note.    Assessment/Plan 1. Bilateral carotid artery  stenosis Recommend:  The patient is symptomatic with respect to the carotid stenosis.  The patient now has progressed and has a lesion the is >70%.  I have completed Shared Decision-Making with Maria [name] to carotid surgery/intervention. The conversation included: -Discussion of all treatment options including carotid endarterectomy (CEA), carotid artery stenting (which includes transcarotid artery revascularization (TCAR)), and optimal medical therapy (OMT). -Explanation of risks and benefits for each option specific to [employer] clinical situation. -Integration of clinical guidelines as it relates to the patient's history and comorbidities. -Discussion and incorporation of Marlou Starks and their personal preferences and priorities in choosing a treatment plan plan  If the patient was unable to participate in Shared Decision Making this process was done with the patient and her husband.  Patient's CT angiography of the carotid arteries confirms >90% left ICA stenosis.  The anatomical considerations support stenting over surgery.  This was discussed in detail with the patient.  The risks, benefits and alternative therapies were reviewed in detail with the patient.  All questions were answered.  The patient agrees to proceed with stenting of the left internal carotid artery.  The patient's NIHSS score is as follows: 0 Mild: 1 - 5 Mild to Moderately Severe: 5 - 14 Severe: 15 - 24 Very Severe: >25  Continue antiplatelet therapy as prescribed. Continue management of CAD, HTN and Hyperlipidemia. Healthy heart diet, encouraged exercise at least 4 times per week.   2. Atherosclerosis of native artery of both lower extremities with intermittent claudication (HCC)  Recommend:  The patient has evidence of atherosclerosis of the lower extremities with claudication.  The patient does not voice lifestyle limiting changes at this point in time.  Noninvasive studies do not suggest  clinically significant change.  No invasive studies, angiography or surgery at this time The patient should continue walking and begin a more formal exercise program.  The patient should continue antiplatelet therapy and aggressive treatment of the lipid abnormalities  No changes in the patient's medications at this time  Continued surveillance is indicated as atherosclerosis is likely to progress with time.    The patient will continue follow up with noninvasive studies as ordered.   3. Stage 2 moderate COPD by GOLD classification (HCC) Continue pulmonary medications and aerosols as already ordered, these medications have been reviewed and there are no changes at this time.   4. Cervical disc disorder with radiculopathy of cervical region (right) Continue NSAID medications as already ordered, these medications have been reviewed and there are no changes at this time.  Continued activity and therapy was stressed.  5. Mixed hyperlipidemia Continue statin as ordered and reviewed, no changes at this time    Levora Dredge, MD  02/22/2023 10:54 AM

## 2023-02-23 ENCOUNTER — Ambulatory Visit (INDEPENDENT_AMBULATORY_CARE_PROVIDER_SITE_OTHER): Payer: Medicare HMO | Admitting: Vascular Surgery

## 2023-02-23 ENCOUNTER — Encounter (INDEPENDENT_AMBULATORY_CARE_PROVIDER_SITE_OTHER): Payer: Self-pay | Admitting: Vascular Surgery

## 2023-02-23 VITALS — BP 116/72 | HR 63 | Resp 16

## 2023-02-23 DIAGNOSIS — E782 Mixed hyperlipidemia: Secondary | ICD-10-CM | POA: Diagnosis not present

## 2023-02-23 DIAGNOSIS — M5010 Cervical disc disorder with radiculopathy, unspecified cervical region: Secondary | ICD-10-CM | POA: Diagnosis not present

## 2023-02-23 DIAGNOSIS — J449 Chronic obstructive pulmonary disease, unspecified: Secondary | ICD-10-CM

## 2023-02-23 DIAGNOSIS — I70213 Atherosclerosis of native arteries of extremities with intermittent claudication, bilateral legs: Secondary | ICD-10-CM | POA: Diagnosis not present

## 2023-02-23 DIAGNOSIS — I6523 Occlusion and stenosis of bilateral carotid arteries: Secondary | ICD-10-CM

## 2023-02-23 MED ORDER — CLOPIDOGREL BISULFATE 75 MG PO TABS: 75.0000 mg | ORAL_TABLET | Freq: Every day | ORAL | 5 refills | Status: DC

## 2023-02-27 ENCOUNTER — Ambulatory Visit (INDEPENDENT_AMBULATORY_CARE_PROVIDER_SITE_OTHER): Payer: Medicare HMO

## 2023-02-27 VITALS — Ht 65.0 in | Wt 136.0 lb

## 2023-02-27 DIAGNOSIS — Z0000 Encounter for general adult medical examination without abnormal findings: Secondary | ICD-10-CM

## 2023-02-27 NOTE — Patient Instructions (Addendum)
[name] , Thank you for taking time to come for your Medicare Wellness Visit. I appreciate your ongoing commitment to your health goals. Please review the following plan we discussed and let me know if I can assist you in the future.   Referrals/Orders/Follow-Ups/Clinician Recommendations:   This is a list of the screening recommended for you and due dates:  Health Maintenance  Topic Date Due   COVID-19 Vaccine (5 - 2023-24 season) 03/18/2022   Flu Shot  02/16/2023   Screening for Lung Cancer  03/26/2023   Mammogram  11/24/2023   Medicare Annual Wellness Visit  02/27/2024   DTaP/Tdap/Td vaccine (3 - Td or Tdap) 10/02/2030   Colon Cancer Screening  11/02/2031   Pneumonia Vaccine  Completed   DEXA scan (bone density measurement)  Completed   Hepatitis C Screening  Completed   Zoster (Shingles) Vaccine  Completed   HPV Vaccine  Aged Out    Advanced directives: (In Chart) A copy of your advanced directives are scanned into your chart should your provider ever need it.  Next Medicare Annual Wellness Visit scheduled for next year: Yes  Preventive Care 107 Years and Older, Female Preventive care refers to lifestyle choices and visits with your health care provider that can promote health and wellness. What does preventive care include? A yearly physical exam. This is also called an annual well check. Dental exams once or twice a year. Routine eye exams. Ask your health care provider how often you should have your eyes checked. Personal lifestyle choices, including: Daily care of your teeth and gums. Regular physical activity. Eating a healthy diet. Avoiding tobacco and drug use. Limiting alcohol use. Practicing safe sex. Taking low-dose aspirin every day. Taking vitamin and mineral supplements as recommended by your health care provider. What happens during an annual well check? The services and screenings done by your health care provider during your annual well check will depend  on your age, overall health, lifestyle risk factors, and family history of disease. Counseling  Your health care provider may ask you questions about your: Alcohol use. Tobacco use. Drug use. Emotional well-being. Home and relationship well-being. Sexual activity. Eating habits. History of falls. Memory and ability to understand (cognition). Work and work Astronomer. Reproductive health. Screening  You may have the following tests or measurements: Height, weight, and BMI. Blood pressure. Lipid and cholesterol levels. These may be checked every 5 years, or more frequently if you are over 70 years old. Skin check. Lung cancer screening. You may have this screening every year starting at age 7 if you have a 30-pack-year history of smoking and currently smoke or have quit within the past 15 years. Fecal occult blood test (FOBT) of the stool. You may have this test every year starting at age 20. Flexible sigmoidoscopy or colonoscopy. You may have a sigmoidoscopy every 5 years or a colonoscopy every 10 years starting at age 70. Hepatitis C blood test. Hepatitis B blood test. Sexually transmitted disease (STD) testing. Diabetes screening. This is done by checking your blood sugar (glucose) after you have not eaten for a while (fasting). You may have this done every 1-3 years. Bone density scan. This is done to screen for osteoporosis. You may have this done starting at age 38. Mammogram. This may be done every 1-2 years. Talk to your health care provider about how often you should have regular mammograms. Talk with your health care provider about your test results, treatment options, and if necessary, the need for  more tests. Vaccines  Your health care provider may recommend certain vaccines, such as: Influenza vaccine. This is recommended every year. Tetanus, diphtheria, and acellular pertussis (Tdap, Td) vaccine. You may need a Td booster every 10 years. Zoster vaccine. You may need  this after age 74. Pneumococcal 13-valent conjugate (PCV13) vaccine. One dose is recommended after age 53. Pneumococcal polysaccharide (PPSV23) vaccine. One dose is recommended after age 39. Talk to your health care provider about which screenings and vaccines you need and how often you need them. This information is not intended to replace advice given to you by your health care provider. Make sure you discuss any questions you have with your health care provider. Document Released: 07/31/2015 Document Revised: 03/23/2016 Document Reviewed: 05/05/2015 Elsevier Interactive Patient Education  2017 [employer].  Fall Prevention in the Home Falls can cause injuries. They can happen to people of all ages. There are many things you can do to make your home safe and to help prevent falls. What can I do on the outside of my home? Regularly fix the edges of walkways and driveways and fix any cracks. Remove anything that might make you trip as you walk through a door, such as a raised step or threshold. Trim any bushes or trees on the path to your home. Use bright outdoor lighting. Clear any walking paths of anything that might make someone trip, such as rocks or tools. Regularly check to see if handrails are loose or broken. Make sure that both sides of any steps have handrails. Any raised decks and porches should have guardrails on the edges. Have any leaves, snow, or ice cleared regularly. Use sand or salt on walking paths during winter. Clean up any spills in your garage right away. This includes oil or grease spills. What can I do in the bathroom? Use night lights. Install grab bars by the toilet and in the tub and shower. Do not use towel bars as grab bars. Use non-skid mats or decals in the tub or shower. If you need to sit down in the shower, use a plastic, non-slip stool. Keep the floor dry. Clean up any water that spills on the floor as soon as it happens. Remove soap buildup in the tub  or shower regularly. Attach bath mats securely with double-sided non-slip rug tape. Do not have throw rugs and other things on the floor that can make you trip. What can I do in the bedroom? Use night lights. Make sure that you have a light by your bed that is easy to reach. Do not use any sheets or blankets that are too big for your bed. They should not hang down onto the floor. Have a firm chair that has side arms. You can use this for support while you get dressed. Do not have throw rugs and other things on the floor that can make you trip. What can I do in the kitchen? Clean up any spills right away. Avoid walking on wet floors. Keep items that you use a lot in easy-to-reach places. If you need to reach something above you, use a strong step stool that has a grab bar. Keep electrical cords out of the way. Do not use floor polish or wax that makes floors slippery. If you must use wax, use non-skid floor wax. Do not have throw rugs and other things on the floor that can make you trip. What can I do with my stairs? Do not leave any items on the stairs. Make  sure that there are handrails on both sides of the stairs and use them. Fix handrails that are broken or loose. Make sure that handrails are as long as the stairways. Check any carpeting to make sure that it is firmly attached to the stairs. Fix any carpet that is loose or worn. Avoid having throw rugs at the top or bottom of the stairs. If you do have throw rugs, attach them to the floor with carpet tape. Make sure that you have a light switch at the top of the stairs and the bottom of the stairs. If you do not have them, ask someone to add them for you. What else can I do to help prevent falls? Wear shoes that: Do not have high heels. Have rubber bottoms. Are comfortable and fit you well. Are closed at the toe. Do not wear sandals. If you use a stepladder: Make sure that it is fully opened. Do not climb a closed stepladder. Make  sure that both sides of the stepladder are locked into place. Ask someone to hold it for you, if possible. Clearly mark and make sure that you can see: Any grab bars or handrails. First and last steps. Where the edge of each step is. Use tools that help you move around (mobility aids) if they are needed. These include: Canes. Walkers. Scooters. Crutches. Turn on the lights when you go into a dark area. Replace any light bulbs as soon as they burn out. Set up your furniture so you have a clear path. Avoid moving your furniture around. If any of your floors are uneven, fix them. If there are any pets around you, be aware of where they are. Review your medicines with your doctor. Some medicines can make you feel dizzy. This can increase your chance of falling. Ask your doctor what other things that you can do to help prevent falls. This information is not intended to replace advice given to you by your health care provider. Make sure you discuss any questions you have with your health care provider. Document Released: 04/30/2009 Document Revised: 12/10/2015 Document Reviewed: 08/08/2014 Elsevier Interactive Patient Education  2017 [employer].

## 2023-02-27 NOTE — Progress Notes (Signed)
Subjective:   Maria [name] is a 67 y.o. female who presents for Medicare Annual (Subsequent) preventive examination.  Visit Complete: Virtual  I connected with  Maria [name] on 02/27/23 by a audio enabled telemedicine application and verified that I am speaking with the correct person using two identifiers.  Patient Location: Home  Provider Location: Home Office  I discussed the limitations of evaluation and management by telemedicine. The patient expressed understanding and agreed to proceed.  Patient Medicare AWV questionnaire was completed by the patient on 02/26/23; I have confirmed that all information answered by patient is correct and no changes since this date.  Review of Systems    Vital Signs: Unable to obtain new vitals due to this being a telehealth visit.  Cardiac Risk Factors include: advanced age (>80men, >9 women);hypertension;Other (see comment), Risk factor comments: Dx: COPD     Objective:    Today's Vitals   02/27/23 1623 02/27/23 1624  Weight: 136 lb (61.7 kg)   Height: 5\' 5"  (1.651 m)   PainSc:  6    Body mass index is 22.63 kg/m.     02/06/2023    9:29 AM 12/14/2022    1:09 PM 11/03/2022    8:46 AM 08/09/2022    8:35 AM 04/07/2022    9:24 AM 02/23/2022   11:19 AM 11/01/2021   10:36 AM  Advanced Directives  Does Patient Have a Medical Advance Directive? Yes Yes Yes Yes No No No  Type of Best boy of [place];Living will      Does patient want to make changes to medical advance directive? No - Patient declined No - Guardian declined       Would patient like information on creating a medical advance directive?     Yes (MAU/Ambulatory/Procedural Areas - Information given) Yes (Inpatient - patient defers creating a medical advance directive at this time - Information given) No - Patient declined    Current Medications (verified) Outpatient Encounter Medications as of 02/27/2023  Medication Sig   albuterol (VENTOLIN HFA) 108  (90 Base) MCG/ACT inhaler TAKE 2 PUFFS BY MOUTH EVERY 6 HOURS AS NEEDED FOR WHEEZE OR SHORTNESS OF BREATH   ASPIRIN LOW DOSE 81 MG EC tablet TAKE 1 TABLET BY MOUTH EVERY DAY   atorvastatin (LIPITOR) 10 MG tablet Take 1 tablet (10 mg total) by mouth daily.   Azelastine HCl 137 MCG/SPRAY SOLN USE 1 SPRAY INTO EACH NOSTRIL TWICE A DAY   benzonatate (TESSALON) 200 MG capsule Take 1 capsule by mouth three times daily as needed   Budeson-Glycopyrrol-Formoterol (BREZTRI AEROSPHERE) 160-9-4.8 MCG/ACT AERO Inhale 2 puffs into the lungs in the morning and at bedtime.   Cholecalciferol (VITAMIN D-3) 125 MCG (5000 UT) TABS Take 5,000 Units by mouth daily.   clopidogrel (PLAVIX) 75 MG tablet Take 1 tablet (75 mg total) by mouth daily.   escitalopram (LEXAPRO) 20 MG tablet Take 1 tablet (20 mg total) by mouth daily.   gabapentin (NEURONTIN) 600 MG tablet Take 1 tablet (600 mg total) by mouth every 8 (eight) hours.   hydrOXYzine (VISTARIL) 25 MG capsule TAKE 1 CAPSULE BY MOUTH AT BEDTIME AS NEEDED   ipratropium-albuterol (DUONEB) 0.5-2.5 (3) MG/3ML SOLN USE 1 AMPULE IN NEBULIZER 4 TIMES A DAY AS NEEDED (DX: J84.112, J44.9)   oxybutynin (DITROPAN-XL) 10 MG 24 hr tablet Take 1 tablet (10 mg total) by mouth daily.   OXYGEN Inhale into the lungs. 3 liters at rest and 5 liters with any  activity.-   pantoprazole (PROTONIX) 40 MG tablet Take 1 tablet by mouth once daily   Potassium 99 MG TABS Take 99 mg by mouth daily.   Respiratory Therapy Supplies (FLUTTER) DEVI Use as directed   roflumilast (DALIRESP) 500 MCG TABS tablet Take 1 tablet by mouth once daily   sodium chloride HYPERTONIC 3 % nebulizer solution Take by nebulization 2 (two) times daily.   [DISCONTINUED] OVER THE COUNTER MEDICATION Take 600 mg by mouth daily at 6 (six) AM. N-Acetyl-l-Cysteine tablet   Facility-Administered Encounter Medications as of 02/27/2023  Medication   Study - ARNASA [id number] - astegolimab 238 mg/1.7 mL or placebo SQ injection  (PI-Ramaswamy)    Allergies (verified) Montelukast   History: Past Medical History:  Diagnosis Date   Atherosclerosis [id number]   aorta, iliacs and CFA bilaterally no greater than 0-49% - lower arterial doppler.   Blood transfusion without reported diagnosis 2005   Cancer [facility]) Ovarian 1978   Chest pain    COPD (chronic obstructive pulmonary disease) (HCC)    COVID-19 long hauler manifesting chronic dyspnea    Emphysema of lung (HCC) 2019   Oxygen deficiency 03/2019   Oxygen dependent    3 liters up to 5   Past Surgical History:  Procedure Laterality Date   ABDOMINAL HYSTERECTOMY  1978   CHOLECYSTECTOMY  2010   COLONOSCOPY     COLONOSCOPY     COLONOSCOPY WITH PROPOFOL N/A 11/01/2021   Procedure: COLONOSCOPY WITH PROPOFOL;  Surgeon: Wyline Mood, MD;  Location: [facility] ENDOSCOPY;  Service: Gastroenterology;  Laterality: N/A;   ESOPHAGOGASTRODUODENOSCOPY     NECK SURGERY  1999   OVARIAN CYST REMOVAL  1988   SPINE SURGERY  Neck 2002   Family History  Problem Relation Age of Onset   Heart failure Mother    Diabetes Mother    Kidney disease Mother    Hypertension Mother    Arthritis Mother    Heart disease Mother    Obesity Mother    COPD Father    Hearing loss Father    Lung cancer Brother    COPD Brother    COPD Brother    Asthma Son    Diabetes Son    Birth defects Son    Breast cancer Maternal Aunt 40   Social History   Socioeconomic History   Marital status: Married    Spouse name: Marcial Pacas Manufacturing systems engineer)   Number of children: 3   Years of education: Some college   Highest education level: Some college, no degree  Occupational History   Not on file  Tobacco Use   Smoking status: Former    Current packs/day: 0.00    Average packs/day: 1 pack/day for 45.0 years (45.0 ttl pk-yrs)    Types: Cigarettes    Start date: 03/19/1971    Quit date: 03/18/2016    Years since quitting: 6.9    Passive exposure: Past   Smokeless tobacco: Never  Vaping Use   Vaping status:  Never Used  Substance and Sexual Activity   Alcohol use: No    Alcohol/week: 0.0 standard drinks of alcohol   Drug use: No   Sexual activity: Not Currently  Other Topics Concern   Not on file  Social History Narrative   08/27/19   From: the area (but [place] area)   Living: with husband [name] - since 1987   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      Family: Daughter Aileen Pilot nearby, son Thereasa Distance in [place], and  other daughter - 1 hour; 5 grandchildren      Enjoys: crochet, relax at home      Exercise: on hold but has been doing pulmonary rehab   Diet: not currently, but has lost weight      Safety   Seat belts: Yes    Guns: Yes  and secure   Safe in relationships: Yes    Social Determinants of Health   Financial Resource Strain: Low Risk  (02/27/2023)   Overall Financial Resource Strain (CARDIA)    Difficulty of Paying Living Expenses: Not hard at all  Food Insecurity: No Food Insecurity (02/27/2023)   Hunger Vital Sign    Worried About Running Out of Food in the Last Year: Never true    Ran Out of Food in the Last Year: Never true  Transportation Needs: No Transportation Needs (02/27/2023)   PRAPARE - Administrator, Civil Service (Medical): No    Lack of Transportation (Non-Medical): No  Physical Activity: Inactive (02/27/2023)   Exercise Vital Sign    Days of Exercise per Week: 0 days    Minutes of Exercise per Session: 0 min  Stress: No Stress Concern Present (02/27/2023)   [employer] of Occupational Health - Occupational Stress Questionnaire    Feeling of Stress : Not at all  Social Connections: Socially Integrated (02/27/2023)   Social Connection and Isolation Panel [NHANES]    Frequency of Communication with Friends and Family: More than three times a week    Frequency of Social Gatherings with Friends and Family: More than three times a week    Attends Religious Services: More than 4 times per year    Active Member of [employer] or  Organizations: Yes    Attends Engineer, structural: More than 4 times per year    Marital Status: Married    Tobacco Counseling Counseling given: Not Answered   Clinical Intake:  Pre-visit preparation completed: Yes  Pain : 0-10 Pain Score: 6  Pain Type: Acute pain Pain Location: Neck Pain Orientation: Right Pain Descriptors / Indicators: Aching, Numbness, Throbbing Pain Onset: More than a month ago Pain Frequency: Constant     Nutritional Status: BMI of 19-24  Normal Nutritional Risks: None Diabetes: No  How often do you need to have someone help you when you read instructions, pamphlets, or other written materials from your doctor or pharmacy?: 1 - Never  Interpreter Needed?: No  Information entered by :: Theresa Mulligan LPN   Activities of Daily Living    02/27/2023    4:37 PM 02/26/2023    8:57 AM  In your present state of health, do you have any difficulty performing the following activities:  Hearing? 0 0  Vision? 0 0  Difficulty concentrating or making decisions? 1 1  Walking or climbing stairs? 1 1  Comment Uses cane and walker   Dressing or bathing? 0 0  Doing errands, shopping? 1 1  Comment Husband assist   Preparing Food and eating ? N N  Using the Toilet? N N  In the past six months, have you accidently leaked urine? Malvin Johns  Comment Wears breifs and pads. Followed by Urologist   Do you have problems with loss of bowel control? N N  Managing your Medications? N N  Managing your Finances? N N  Housekeeping or managing your Housekeeping? N Y    Patient Care Team: Eden Emms, NP as PCP - General (Nurse Practitioner)  Indicate any recent Medical Services  you may have received from other than [facility] providers in the past year (date may be approximate).     Assessment:   This is a routine wellness examination for Maria [name].  Hearing/Vision screen Hearing Screening - Comments:: Denies hearing difficulties   Vision Screening - Comments::  Wears rx glasses - up to date with routine eye exams with  [facility]  Dietary issues and exercise activities discussed:     Goals Addressed               This Visit's Progress     Stay Alive! (pt-stated)         Depression Screen    02/27/2023    4:36 PM 11/30/2022    9:32 AM 11/03/2022    8:45 AM 09/01/2022    9:27 AM 08/09/2022    8:35 AM 04/18/2022   12:25 PM 03/18/2022    8:39 AM  PHQ 2/9 Scores  PHQ - 2 Score 0 2 0 2 0 1 5  PHQ- 9 Score  7  8  3 11     Fall Risk    02/27/2023    4:39 PM 02/26/2023    8:57 AM 02/06/2023    9:29 AM 12/14/2022    1:07 PM 11/30/2022    9:32 AM  Fall Risk   Falls in the past year? 1 1 1 1 1   Number falls in past yr: 1 1 1 1 1   Injury with Fall? 1 1 1 1 1   Comment Fx Left foot followed by medical attention  fx left foot  06/09/22 fx left foot 11/23   Follow up     Falls evaluation completed    MEDICARE RISK AT HOME:  Medicare Risk at Home - 02/27/23 1646     Any stairs in or around the home? Yes    If so, are there any without handrails? No    Home free of loose throw rugs in walkways, pet beds, electrical cords, etc? Yes    Adequate lighting in your home to reduce risk of falls? Yes    Life alert? No    Use of a cane, walker or w/c? Yes    Grab bars in the bathroom? Yes    Shower chair or bench in shower? Yes    Elevated toilet seat or a handicapped toilet? Yes             TIMED UP AND GO:  Was the test performed?  No    Cognitive Function:        02/27/2023    4:42 PM  6CIT Screen  What Year? 0 points  What month? 0 points  What time? 0 points  Count back from 20 0 points  Months in reverse 0 points  Repeat phrase 0 points  Total Score 0 points    Immunizations Immunization History  Administered Date(s) Administered   Fluad Quad(high Dose 65+) 03/30/2022   Influenza, High Dose Seasonal PF 03/30/2022   Influenza, Seasonal, Injecte, Preservative Fre 04/28/2008, 04/09/2010   Influenza,inj,Quad PF,6+  Mos 04/22/2019, 04/25/2020, 03/15/2021   Influenza-Unspecified 06/01/2008, 05/10/2016, 03/18/2017   Moderna Sars-Covid-2 Vaccination 07/30/2019, 08/28/2019, 06/27/2020   PNEUMOCOCCAL CONJUGATE-20 07/06/2022   Pfizer Covid-19 Vaccine Bivalent Booster 60yrs & up 07/28/2021   Pneumococcal Polysaccharide-23 04/15/2015, 04/22/2020   RSV,unspecified 07/06/2022   Respiratory Syncytial Virus Vaccine,Recomb Aduvanted(Arexvy) 05/30/2022   Td 10/01/2020   Tdap 11/17/2009   Zoster Recombinant(Shingrix) 04/16/2020, 06/22/2020    TDAP status: Up to date  Flu Vaccine  status: Due, Education has been provided regarding the importance of this vaccine. Advised may receive this vaccine at local pharmacy or Health Dept. Aware to provide a copy of the vaccination record if obtained from local pharmacy or Health Dept. Verbalized acceptance and understanding.  Pneumococcal vaccine status: Up to date  Covid-19 vaccine status: Declined, Education has been provided regarding the importance of this vaccine but patient still declined. Advised may receive this vaccine at local pharmacy or Health Dept.or vaccine clinic. Aware to provide a copy of the vaccination record if obtained from local pharmacy or Health Dept. Verbalized acceptance and understanding.  Qualifies for Shingles Vaccine? Yes   Zostavax completed Yes   Shingrix Completed?: Yes  Screening Tests Health Maintenance  Topic Date Due   COVID-19 Vaccine (5 - 2023-24 season) 03/18/2022   INFLUENZA VACCINE  02/16/2023   Lung Cancer Screening  03/26/2023   MAMMOGRAM  11/24/2023   Medicare Annual Wellness (AWV)  02/27/2024   DTaP/Tdap/Td (3 - Td or Tdap) 10/02/2030   Colonoscopy  11/02/2031   Pneumonia Vaccine 25+ Years old  Completed   DEXA SCAN  Completed   Hepatitis C Screening  Completed   Zoster Vaccines- Shingrix  Completed   HPV VACCINES  Aged Out    Health Maintenance  Health Maintenance Due  Topic Date Due   COVID-19 Vaccine (5 -  2023-24 season) 03/18/2022   INFLUENZA VACCINE  02/16/2023   Lung Cancer Screening  03/26/2023    Colorectal cancer screening: Type of screening: Colonoscopy. Completed 11/01/21. Repeat every 10 years  Mammogram status: Completed 11/23/21. Repeat every year  Bone Density status: Completed 11/23/21. Results reflect: Bone density results: OSTEOPOROSIS. Repeat every   years.  Lung Cancer Screening: (Low Dose CT Chest recommended if Age 43-80 years, 20 pack-year currently smoking OR have quit w/in 15years.) does not qualify.    Additional Screening:  Hepatitis C Screening: does qualify; Completed 10/31/17  Vision Screening: Recommended annual ophthalmology exams for early detection of glaucoma and other disorders of the eye. Is the patient up to date with their annual eye exam?  Yes  Who is the provider or what is the name of the office in which the patient attends annual eye exams? [facility] If pt is not established with a provider, would they like to be referred to a provider to establish care? No .   Dental Screening: Recommended annual dental exams for proper oral hygiene   Community Resource Referral / Chronic Care Management:  CRR required this visit?  No   CCM required this visit?  No     Plan:     I have personally reviewed and noted the following in the patient's chart:   Medical and social history Use of alcohol, tobacco or illicit drugs  Current medications and supplements including opioid prescriptions. Patient is not currently taking opioid prescriptions. Functional ability and status Nutritional status Physical activity Advanced directives List of other physicians Hospitalizations, surgeries, and ER visits in previous 12 months Vitals Screenings to include cognitive, depression, and falls Referrals and appointments  In addition, I have reviewed and discussed with patient certain preventive protocols, quality metrics, and best practice recommendations. A  written personalized care plan for preventive services as well as general preventive health recommendations were provided to patient.     Tillie Rung, LPN   2/53/6644   After Visit Summary: (MyChart) Due to this being a telephonic visit, the after visit summary with patients personalized plan was offered to patient  via MyChart   Nurse Notes: None

## 2023-02-28 ENCOUNTER — Encounter: Payer: Medicare HMO | Admitting: *Deleted

## 2023-02-28 DIAGNOSIS — J441 Chronic obstructive pulmonary disease with (acute) exacerbation: Secondary | ICD-10-CM

## 2023-02-28 DIAGNOSIS — Z006 Encounter for examination for normal comparison and control in clinical research program: Secondary | ICD-10-CM

## 2023-02-28 MED ORDER — STUDY - ARNASA GB44332 - ASTEGOLIMAB 238 MG/1.7 ML OR PLACEBO SQ INJECTION (PI-RAMASWAMY)
476.0000 mg | INJECTION | SUBCUTANEOUS | Status: DC
  Administered 2023-02-28: 476 mg via SUBCUTANEOUS
  Filled 2023-02-28: qty 3.4

## 2023-02-28 NOTE — Research (Cosign Needed)
Title: A Phase III, randomized, double-blind, placebo controlled, multicenter study to evaluate the efficacy and safety of astegolimab in patients with chronic obstructive pulmonary disease    Dose and Duration of Treatment: Astegolimab is presented as a sterile, slightly brown-yellow solution. Each single-use, 2.25 mL pre-filled syringe contains 1.7 mL deliverable volume. Astegolimab drug product is formulated at 140 mg/mL astegolimab with 114 mM succinic acid, 200 mM L-arginine, 10 mM L-methionine, 0.06% (w/v) polysorbate 20, pH 5.7. Placebo for astegolimab is supplied in an identical pre-filled syringe configuration.   Protocol # [id number]   Sponsor: [employer] [address], [place]   [employer] [facility] [employer] [facility] Astronomer note:   This visit for Subject Maria [name] with DOB: 12/08/1955 on 02/28/2023 for the above protocol is Visit/Encounter # 13/Week 22  and is for purpose of research.   The consent for this encounter is under Protocol Version Protocol: Version 3, dated 26/May2023, Version 4 dated 20Jun2023 IB: version 9.0 dated April 2024 ICF: Main version 12Apr2023, revised 17July2023 [facility] 01Sep2023, revised 01Sep2023 is currently IRB approved.   Subjectexpressed continued interest and consent in continuing as a study subject. Subject confirmed that there was  no change in contact information (e.g. address, telephone, email). Subject thanked for participation in research and contribution to science. The Subject was informed that the PI Dr. Marchelle Gearing continues to have oversight of the subject's visits and course through relevant discussions, reviews, and also specifically of this visit by routing of this note to the PI.  All procedures completed per the above mentioned protocol. Subject tolerated injections well without complaints. New medication added to conmed list-Clopidogrel.   Refer to the subjects paper source binder for  further details of the visit.    Signed by Carron Curie, CMA, BS, [facility] Clinical Research Coordinator [place], [place] 40:98 am 02/28/2023

## 2023-03-01 ENCOUNTER — Ambulatory Visit (INDEPENDENT_AMBULATORY_CARE_PROVIDER_SITE_OTHER): Payer: Medicare HMO | Admitting: Nurse Practitioner

## 2023-03-01 ENCOUNTER — Encounter: Payer: Self-pay | Admitting: Nurse Practitioner

## 2023-03-01 VITALS — BP 112/78 | HR 66 | Temp 98.0°F | Ht 65.0 in | Wt 135.4 lb

## 2023-03-01 DIAGNOSIS — J449 Chronic obstructive pulmonary disease, unspecified: Secondary | ICD-10-CM

## 2023-03-01 DIAGNOSIS — Q761 Klippel-Feil syndrome: Secondary | ICD-10-CM

## 2023-03-01 DIAGNOSIS — J9611 Chronic respiratory failure with hypoxia: Secondary | ICD-10-CM

## 2023-03-01 DIAGNOSIS — Z87891 Personal history of nicotine dependence: Secondary | ICD-10-CM

## 2023-03-01 DIAGNOSIS — I6522 Occlusion and stenosis of left carotid artery: Secondary | ICD-10-CM | POA: Diagnosis not present

## 2023-03-01 DIAGNOSIS — M25511 Pain in right shoulder: Secondary | ICD-10-CM | POA: Diagnosis not present

## 2023-03-01 DIAGNOSIS — R7303 Prediabetes: Secondary | ICD-10-CM

## 2023-03-01 DIAGNOSIS — I700 Atherosclerosis of aorta: Secondary | ICD-10-CM

## 2023-03-01 DIAGNOSIS — S92352S Displaced fracture of fifth metatarsal bone, left foot, sequela: Secondary | ICD-10-CM | POA: Diagnosis not present

## 2023-03-01 DIAGNOSIS — Z0000 Encounter for general adult medical examination without abnormal findings: Secondary | ICD-10-CM | POA: Diagnosis not present

## 2023-03-01 DIAGNOSIS — G479 Sleep disorder, unspecified: Secondary | ICD-10-CM

## 2023-03-01 DIAGNOSIS — Z1231 Encounter for screening mammogram for malignant neoplasm of breast: Secondary | ICD-10-CM

## 2023-03-01 DIAGNOSIS — M5010 Cervical disc disorder with radiculopathy, unspecified cervical region: Secondary | ICD-10-CM | POA: Diagnosis not present

## 2023-03-01 DIAGNOSIS — G894 Chronic pain syndrome: Secondary | ICD-10-CM | POA: Diagnosis not present

## 2023-03-01 LAB — LIPID PANEL
Cholesterol: 143 mg/dL (ref 0–200)
HDL: 56.2 mg/dL (ref >39.00)
LDL Cholesterol: 69 mg/dL (ref 0–99)
NonHDL: 87.22
Total CHOL/HDL Ratio: 3
Triglycerides: 92 mg/dL (ref 0.0–149.0)
VLDL: 18.4 mg/dL (ref 0.0–40.0)

## 2023-03-01 LAB — TSH: TSH: 2.16 u[IU]/mL (ref 0.35–5.50)

## 2023-03-01 LAB — URINALYSIS, MICROSCOPIC ONLY

## 2023-03-01 LAB — COMPREHENSIVE METABOLIC PANEL
ALT: 9 U/L (ref 0–35)
AST: 14 U/L (ref 0–37)
Albumin: 4 g/dL (ref 3.5–5.2)
Alkaline Phosphatase: 66 U/L (ref 39–117)
BUN: 14 mg/dL (ref 6–23)
CO2: 26 mEq/L (ref 19–32)
Calcium: 9.5 mg/dL (ref 8.4–10.5)
Chloride: 100 mEq/L (ref 96–112)
Creatinine, Ser: 0.63 mg/dL (ref 0.40–1.20)
GFR: 92.7 mL/min (ref >60.00)
Glucose, Bld: 69 mg/dL — ABNORMAL LOW (ref 70–99)
Potassium: 3.8 mEq/L (ref 3.5–5.1)
Sodium: 135 mEq/L (ref 135–145)
Total Bilirubin: 0.6 mg/dL (ref 0.2–1.2)
Total Protein: 6.5 g/dL (ref 6.0–8.3)

## 2023-03-01 LAB — CBC
HCT: 38.3 % (ref 36.0–46.0)
Hemoglobin: 12.5 g/dL (ref 12.0–15.0)
MCHC: 32.7 g/dL (ref 30.0–36.0)
MCV: 96.5 fl (ref 78.0–100.0)
Platelets: 315 10*3/uL (ref 150.0–400.0)
RBC: 3.96 Mil/uL (ref 3.87–5.11)
RDW: 13.3 % (ref 11.5–15.5)
WBC: 8.5 10*3/uL (ref 4.0–10.5)

## 2023-03-01 LAB — HEMOGLOBIN A1C: Hgb A1c MFr Bld: 5.7 % (ref 4.6–6.5)

## 2023-03-01 NOTE — Assessment & Plan Note (Signed)
Patient seems to be tolerating hydroxyzine 25 mg nightly well.  Continue

## 2023-03-01 NOTE — Progress Notes (Signed)
Established Patient Office Visit  Subjective   Patient ID: Maria [name], female    DOB: January 14, 1956  Age: 67 y.o. MRN: [id number]  Chief Complaint  Patient presents with   Annual Exam    No questions or concerns.     HPI   for complete physical and follow up of chronic conditions.   COPD/Chronic resp failure: patient of Pulm and doing research study sees her every 2 weeks patient currently on 4 L of continuous oxygen through nasal cannula.  GERD: States protonix and well controlled.  She will have periods of breakthrough which she will use over-the-counter acid reducing medication  Chronic pian syndrome: Dr. Cherylann Ratel every 3 months and does injections and gabapentin   MDD: states that the lexapro helps with the mood.  Patient denies HI/SI/AVH.  Sleep: states that she does hydroxyzine 25mg  as needed. States she can go to sellp but wakes up and can take several hours to go to sleep. Takes it at night evey night   Immunizations: -Tetanus: Completed in 2022 -Influenza: Completed this season -Shingles: Completed Shingrix series -Pneumonia: Completed   Diet: Fair diet. States that she is doing 2 melas a day. States that she is doing water and coffee.  Exercise: No regular exercise. House work   Eye exam: Completes annually . Wears glasses Dental exam: Completes semi-annually. Has dentures recently seen    Colonoscopy: Completed in 2023 with 3 to 4-week recall Lung Cancer Screening: Follow-up pulmonology did review last CT of chest without contrast that did not show any pulmonary nodules  Pap smear: hystrectomey   Mammogram: [place], needs updating order placed today       Review of Systems  Constitutional:  Negative for chills and fever.  Respiratory:  Positive for shortness of breath.   Cardiovascular:  Negative for chest pain and leg swelling.  Gastrointestinal:  Negative for abdominal pain, blood in stool, constipation, diarrhea, nausea and vomiting.       Bm  daily   Genitourinary:  Negative for dysuria and hematuria.  Neurological:  Positive for tingling. Negative for headaches.  Psychiatric/Behavioral:  Negative for hallucinations and suicidal ideas.       Objective:     BP 112/78   Pulse 66   Temp 98 F (36.7 C) (Temporal)   Ht 5\' 5"  (1.651 m)   Wt 135 lb 6.4 oz (61.4 kg)   SpO2 99%   BMI 22.53 kg/m  BP Readings from Last 3 Encounters:  03/01/23 112/78  02/23/23 116/72  02/06/23 (!) 117/58   Wt Readings from Last 3 Encounters:  03/01/23 135 lb 6.4 oz (61.4 kg)  02/27/23 136 lb (61.7 kg)  02/06/23 136 lb (61.7 kg)      Physical Exam Vitals and nursing note reviewed.  Constitutional:      Appearance: Normal appearance.  HENT:     Right Ear: Tympanic membrane, ear canal and external ear normal.     Left Ear: Tympanic membrane, ear canal and external ear normal.     Mouth/Throat:     Mouth: Mucous membranes are moist.     Pharynx: Oropharynx is clear.  Eyes:     Extraocular Movements: Extraocular movements intact.     Pupils: Pupils are equal, round, and reactive to light.  Cardiovascular:     Rate and Rhythm: Normal rate and regular rhythm.     Pulses: Normal pulses.     Heart sounds: Normal heart sounds.  Pulmonary:     Effort: Pulmonary effort  is normal.     Comments: Globally decreased Abdominal:     General: Bowel sounds are normal. There is no distension.     Palpations: There is no mass.     Tenderness: There is no abdominal tenderness.     Hernia: No hernia is present.  Musculoskeletal:     Right lower leg: No edema.     Left lower leg: No edema.  Lymphadenopathy:     Cervical: No cervical adenopathy.  Skin:    General: Skin is warm.  Neurological:     General: No focal deficit present.     Mental Status: She is alert.     Deep Tendon Reflexes:     Reflex Scores:      Bicep reflexes are 2+ on the right side and 2+ on the left side.      Patellar reflexes are 2+ on the right side and 2+ on the  left side.    Comments: Bilateral upper and lower extremity strength 5/5  Psychiatric:        Mood and Affect: Mood normal.        Behavior: Behavior normal.        Thought Content: Thought content normal.        Judgment: Judgment normal.      No results found for any visits on 03/01/23.    The 10-year ASCVD risk score (Arnett DK, et al., 2019) is: 4.2%    Assessment & Plan:   Problem List Items Addressed This Visit       Cardiovascular and Mediastinum   Aortic atherosclerosis (HCC)   Relevant Orders   Lipid panel   Carotid stenosis     Respiratory   Stage 2 moderate COPD by GOLD classification (HCC)   Chronic respiratory failure with hypoxia (HCC)     Musculoskeletal and Integument   Cervical fusion syndrome (C6-7)     Other   Prediabetes   Relevant Orders   Hemoglobin A1c   Lipid panel   Smoking history   Relevant Orders   Urine Microscopic   Sleep disturbance   Other Visit Diagnoses     Preventative health care    -  Primary   Relevant Orders   CBC   Comprehensive metabolic panel   TSH   Lipid panel   Screening mammogram for breast cancer       Relevant Orders   MM 3D SCREENING MAMMOGRAM BILATERAL BREAST       Return in about 1 year (around 02/29/2024) for CPE and Labs.    Audria Nine, NP

## 2023-03-01 NOTE — Assessment & Plan Note (Signed)
History of the same.  Pending A1c

## 2023-03-01 NOTE — Patient Instructions (Signed)
Nice to see you today I will be in touch with the labs once I have reviewed them Follow up with me in 1 year, sooner if you need me  

## 2023-03-01 NOTE — Assessment & Plan Note (Signed)
Patient currently followed by pain management she gets injections in the neck and is prescribed gabapentin.  Continue taking medication as prescribed and follow-up with specialist as recommended

## 2023-03-01 NOTE — Assessment & Plan Note (Signed)
Discussed age-appropriate immunizations and screening exams.  Did review patient's personal, surgical, social, family histories.  Patient is up-to-date on all age-appropriate vaccinations.  Patient is up-to-date on CRC screening.  Order for mammogram placed for breast cancer screening.  Patient no longer a candidate for cervical cancer screening.  Patient was given information at discharge about preventative healthcare maintenance with anticipatory guidance

## 2023-03-01 NOTE — Assessment & Plan Note (Signed)
Patient currently on 4 L nasal cannula.  Followed by pulmonology.  Continue following with pulmonology as recommended

## 2023-03-01 NOTE — Progress Notes (Signed)
Established Patient Office Visit  Subjective   Patient ID: Maria [name], female    DOB: Dec 04, 1955  Age: 67 y.o. MRN: [id number]  Chief Complaint  Patient presents with   Annual Exam    No questions or concerns.     HPI   for complete physical and follow up of chronic conditions.   COPD/Chronic resp failure: patient of Pulm and doing research study sees her every 2 weeks patient currently on 4 L of continuous oxygen through nasal cannula.  GERD: States protonix and well controlled.  She will have periods of breakthrough which she will use over-the-counter acid reducing medication  Chronic pian syndrome: Dr. Cherylann Ratel every 3 months and does injections and gabapentin   MDD: states that the lexapro helps with the mood.  Patient denies HI/SI/AVH.  Sleep: states that she does hydroxyzine 25mg  as needed. States she can go to sellp but wakes up and can take several hours to go to sleep. Takes it at night evey night   Immunizations: -Tetanus: Completed in 2022 -Influenza: Completed this season -Shingles: Completed Shingrix series -Pneumonia: Completed   Diet: Fair diet. States that she is doing 2 melas a day. States that she is doing water and coffee.  Exercise: No regular exercise. House work   Eye exam: Completes annually . Wears glasses Dental exam: Completes semi-annually. Has dentures recently seen    Colonoscopy: Completed in 2023 with 3 to 4-week recall Lung Cancer Screening: Follow-up pulmonology did review last CT of chest without contrast that did not show any pulmonary nodules  Pap smear: hystrectomey   Mammogram: [place], needs updating order placed today       Review of Systems  Constitutional:  Negative for chills and fever.  Respiratory:  Positive for shortness of breath.   Cardiovascular:  Negative for chest pain and leg swelling.  Gastrointestinal:  Negative for abdominal pain, blood in stool, constipation, diarrhea, nausea and vomiting.       Bm  daily   Genitourinary:  Negative for dysuria and hematuria.  Neurological:  Positive for tingling. Negative for headaches.  Psychiatric/Behavioral:  Negative for hallucinations and suicidal ideas.       Objective:     BP 112/78   Pulse 66   Temp 98 F (36.7 C) (Temporal)   Ht 5\' 5"  (1.651 m)   Wt 135 lb 6.4 oz (61.4 kg)   SpO2 99%   BMI 22.53 kg/m  BP Readings from Last 3 Encounters:  03/01/23 112/78  02/23/23 116/72  02/06/23 (!) 117/58   Wt Readings from Last 3 Encounters:  03/01/23 135 lb 6.4 oz (61.4 kg)  02/27/23 136 lb (61.7 kg)  02/06/23 136 lb (61.7 kg)      Physical Exam Vitals and nursing note reviewed.  Constitutional:      Appearance: Normal appearance.  HENT:     Right Ear: Tympanic membrane, ear canal and external ear normal.     Left Ear: Tympanic membrane, ear canal and external ear normal.     Mouth/Throat:     Mouth: Mucous membranes are moist.     Pharynx: Oropharynx is clear.  Eyes:     Extraocular Movements: Extraocular movements intact.     Pupils: Pupils are equal, round, and reactive to light.  Cardiovascular:     Rate and Rhythm: Normal rate and regular rhythm.     Pulses: Normal pulses.     Heart sounds: Normal heart sounds.  Pulmonary:     Effort: Pulmonary effort  is normal.     Comments: Globally decreased Abdominal:     General: Bowel sounds are normal. There is no distension.     Palpations: There is no mass.     Tenderness: There is no abdominal tenderness.     Hernia: No hernia is present.  Musculoskeletal:     Right lower leg: No edema.     Left lower leg: No edema.  Lymphadenopathy:     Cervical: No cervical adenopathy.  Skin:    General: Skin is warm.  Neurological:     General: No focal deficit present.     Mental Status: She is alert.     Deep Tendon Reflexes:     Reflex Scores:      Bicep reflexes are 2+ on the right side and 2+ on the left side.      Patellar reflexes are 2+ on the right side and 2+ on the  left side.    Comments: Bilateral upper and lower extremity strength 5/5  Psychiatric:        Mood and Affect: Mood normal.        Behavior: Behavior normal.        Thought Content: Thought content normal.        Judgment: Judgment normal.      No results found for any visits on 03/01/23.    The 10-year ASCVD risk score (Arnett DK, et al., 2019) is: 4.2%    Assessment & Plan:   Problem List Items Addressed This Visit       Cardiovascular and Mediastinum   Aortic atherosclerosis (HCC)    Patient currently on statin therapy pending lipid panel today      Relevant Orders   Lipid panel   Carotid stenosis    Patient is followed by vascular and had a recent CT angio of the neck.  Continue following specialist as recommended        Respiratory   Stage 2 moderate COPD by GOLD classification [facility])    Patient on Breztri along with albuterol and DuoNebs.  Patient is on continuous nasal cannula followed by pulmonology.  Continue following with pulmonology as recommended taking medication as prescribed      Chronic respiratory failure with hypoxia [facility])    Patient currently on 4 L nasal cannula.  Followed by pulmonology.  Continue following with pulmonology as recommended        Musculoskeletal and Integument   Cervical fusion syndrome (C6-7)    Patient currently followed by pain management she gets injections in the neck and is prescribed gabapentin.  Continue taking medication as prescribed and follow-up with specialist as recommended        Other   Prediabetes    History of the same.  Pending A1c      Relevant Orders   Hemoglobin A1c   Lipid panel   Smoking history    Former smoker check urine microscopy for hematuria.      Relevant Orders   Urine Microscopic   Preventative health care - Primary    Discussed age-appropriate immunizations and screening exams.  Did review patient's personal, surgical, social, family histories.  Patient is up-to-date on all  age-appropriate vaccinations.  Patient is up-to-date on CRC screening.  Order for mammogram placed for breast cancer screening.  Patient no longer a candidate for cervical cancer screening.  Patient was given information at discharge about preventative healthcare maintenance with anticipatory guidance      Relevant Orders   CBC  Comprehensive metabolic panel   TSH   Lipid panel   Sleep disturbance    Patient seems to be tolerating hydroxyzine 25 mg nightly well.  Continue      Other Visit Diagnoses     Screening mammogram for breast cancer       Relevant Orders   MM 3D SCREENING MAMMOGRAM BILATERAL BREAST       Return in about 1 year (around 02/29/2024) for CPE and Labs.    Audria Nine, NP

## 2023-03-01 NOTE — Assessment & Plan Note (Signed)
Patient is followed by vascular and had a recent CT angio of the neck.  Continue following specialist as recommended

## 2023-03-01 NOTE — Assessment & Plan Note (Signed)
Former smoker check urine microscopy for hematuria.

## 2023-03-01 NOTE — Assessment & Plan Note (Signed)
Patient currently on statin therapy pending lipid panel today

## 2023-03-01 NOTE — Assessment & Plan Note (Signed)
Patient on Breztri along with albuterol and DuoNebs.  Patient is on continuous nasal cannula followed by pulmonology.  Continue following with pulmonology as recommended taking medication as prescribed

## 2023-03-02 ENCOUNTER — Telehealth (INDEPENDENT_AMBULATORY_CARE_PROVIDER_SITE_OTHER): Payer: Self-pay

## 2023-03-02 NOTE — Telephone Encounter (Signed)
Spoke with the patient and she is scheduled with Dr. Gilda Crease on 03/07/23 with a 6:45 am arrival time to the [facility] for a left carotid stent placement. Pre-procedure instructions were discussed and will be sent to Mychart.

## 2023-03-06 ENCOUNTER — Ambulatory Visit (HOSPITAL_BASED_OUTPATIENT_CLINIC_OR_DEPARTMENT_OTHER): Payer: Medicare HMO | Admitting: Student in an Organized Health Care Education/Training Program

## 2023-03-06 ENCOUNTER — Encounter: Payer: Self-pay | Admitting: Student in an Organized Health Care Education/Training Program

## 2023-03-06 DIAGNOSIS — M792 Neuralgia and neuritis, unspecified: Secondary | ICD-10-CM | POA: Insufficient documentation

## 2023-03-06 DIAGNOSIS — G894 Chronic pain syndrome: Secondary | ICD-10-CM | POA: Insufficient documentation

## 2023-03-06 DIAGNOSIS — Q761 Klippel-Feil syndrome: Secondary | ICD-10-CM | POA: Insufficient documentation

## 2023-03-06 DIAGNOSIS — M25511 Pain in right shoulder: Secondary | ICD-10-CM | POA: Insufficient documentation

## 2023-03-06 DIAGNOSIS — M5010 Cervical disc disorder with radiculopathy, unspecified cervical region: Secondary | ICD-10-CM | POA: Insufficient documentation

## 2023-03-06 DIAGNOSIS — G8929 Other chronic pain: Secondary | ICD-10-CM | POA: Insufficient documentation

## 2023-03-06 NOTE — Progress Notes (Signed)
Maria [name]: Maria [name]  Service Category: E/M  Provider: Edward Jolly, MD  DOB: 10/11/1955  DOS: 03/06/2023  Location: Office  MRN: [id number]  Setting: Ambulatory outpatient  Referring Provider: Eden Emms, NP  Type: Established Maria [name]  Specialty: Interventional Pain Management  PCP: Eden Emms, NP  Location: Remote location  Delivery: TeleHealth     Virtual Encounter - Pain Management PROVIDER NOTE: Information contained herein reflects review and annotations entered in association with encounter. Interpretation of such information and data should be left to medically-trained personnel. Information provided to Maria [name] can be located elsewhere in the medical record under "Maria [name] Instructions". Document created using STT-dictation technology, any transcriptional errors that may result from process are unintentional.    Contact & Pharmacy Preferred: [phone] Home: [phone] (home) Mobile: [phone] (mobile) E-mail: [id number] - [place], [place] - [address] [place] [address] Phone: [phone] Fax: [phone]  [facility] [place], [place] - [address] [facility] [address] [place] [address] Phone: [phone] Fax: [phone]   Pre-screening  [name] offered "in-person" vs "virtual" encounter. She indicated preferring virtual for this encounter.   Reason COVID-19*  Social distancing based on CDC and AMA recommendations.   I contacted Maria [name] on 03/06/2023 via telephone.      I clearly identified myself as Edward Jolly, MD. I verified that I was speaking with the correct person using two identifiers (Name: Maria [name], and date of birth: [birth year]).  Consent I sought verbal advanced consent from Maria [name] for virtual visit interactions. I informed Maria [name] of possible security and privacy concerns, risks, and limitations associated with providing  "not-in-person" medical evaluation and management services. I also informed Maria [name] of the availability of "in-person" appointments. Finally, I informed her that there would be a charge for the virtual visit and that she could be  personally, fully or partially, financially responsible for it. Maria [name] expressed understanding and agreed to proceed.   Historic Elements   Maria [name] is a 67 y.o. year old, female Maria [name] evaluated today after our last contact on 02/06/2023. Maria [name]  has a past medical history of Atherosclerosis (1/0612014), Blood transfusion without reported diagnosis (2005), Cancer (HCC) (Ovarian 1978), Chest pain, COPD (chronic obstructive pulmonary disease) (HCC), COVID-19 long hauler manifesting chronic dyspnea, Emphysema of lung (HCC) (2019), Oxygen deficiency (03/2019), and Oxygen dependent. She also  has a past surgical history that includes Abdominal hysterectomy (1978); Ovarian cyst removal (1988); Neck surgery (1999); Spine surgery (Neck 2002); Cholecystectomy (2010); Colonoscopy; Colonoscopy; Esophagogastroduodenoscopy; and Colonoscopy with propofol (N/A, 11/01/2021). Maria [name] has a current medication list which includes the following prescription(s): albuterol, aspirin low dose, atorvastatin, azelastine hcl, benzonatate, breztri aerosphere, vitamin d-3, clopidogrel, escitalopram, gabapentin, hydroxyzine, ipratropium-albuterol, oxybutynin, oxygen-helium, pantoprazole, potassium, flutter, roflumilast, and sodium chloride hypertonic, and the following Facility-Administered Medications: arnasa [id number] astegolimab or placebo. She  reports that she quit smoking about 6 years ago. Her smoking use included cigarettes. She started smoking about 52 years ago. She has a 45 pack-year smoking history. She has been exposed to tobacco smoke. She has never used smokeless tobacco. She reports that she does not drink alcohol and does not use drugs. Maria [name] is allergic to montelukast.  BMI:  Estimated body mass index is 22.53 kg/m as calculated from the following:   Height as of 03/01/23: 5\' 5"  (1.651 m).   Weight as of 03/01/23: 135 lb  6.4 oz (61.4 kg). Last encounter: 01/11/2023. Last procedure: 02/06/2023.  HPI  Today, she is being contacted for a post-procedure assessment.   Post-procedure evaluation   Procedure: Cervical Epidural Steroid injection (CESI) (Interlaminar) #1  Laterality: Right  Level: C7-T1 Imaging: Fluoroscopy-assisted DOS: 02/06/2023  Performed by: Edward Jolly, MD Anesthesia: Local anesthesia (1-2% Lidocaine) Anxiolysis: Valium 5 mg PO  Purpose: Diagnostic/Therapeutic Indications: Cervicalgia, cervical radicular pain, degenerative disc disease, severe enough to impact quality of life or function. 1. Cervical fusion syndrome (C6-7)   2. Radicular pain in right arm   3. Chronic pain syndrome   4. Cervical disc disorder with radiculopathy of cervical region (right)    NAS-11 score:   Pre-procedure: 7 /10   Post-procedure: 0-No pain/10      Effectiveness:  Initial hour after procedure: 85 % Subsequent 4-6 hours post-procedure: 85 % Analgesia past initial 6 hours: 85 % (good relief x 7 - 8 days,  burning is back but at the same severity)  Ongoing improvement:  Analgesic:  0% Function: Somewhat improved    Laboratory Chemistry Profile   Renal Lab Results  Component Value Date   BUN 14 03/01/2023   CREATININE 0.63 03/01/2023   GFR 92.70 03/01/2023   GFRAA >60 03/03/2019   GFRNONAA >60 03/03/2019    Hepatic Lab Results  Component Value Date   AST 14 03/01/2023   ALT 9 03/01/2023   ALBUMIN 4.0 03/01/2023   ALKPHOS 66 03/01/2023   LIPASE 19 11/09/2016    Electrolytes Lab Results  Component Value Date   NA 135 03/01/2023   K 3.8 03/01/2023   CL 100 03/01/2023   CALCIUM 9.5 03/01/2023   MG 2.0 03/19/2019   PHOS 3.4 03/19/2019    Bone Lab Results  Component Value Date   VD25OH 35.22 06/03/2019    Inflammation (CRP: Acute  Phase) (ESR: Chronic Phase) Lab Results  Component Value Date   CRP 1.3 (H) 03/03/2019   ESRSEDRATE 41 (H) 07/03/2019         Note: Above Lab results reviewed.  Imaging  CT ANGIO NECK W OR WO CONTRAST CLINICAL DATA:  67 year old female with abnormal carotid Doppler ultrasound last month, suggesting left ICA stenosis. Increasing right arm numbness.  EXAM: CT ANGIOGRAPHY NECK  TECHNIQUE: Multidetector CT imaging of the neck was performed using the standard protocol during bolus administration of intravenous contrast. Multiplanar CT image reconstructions and MIPs were obtained to evaluate the vascular anatomy. Carotid stenosis measurements (when applicable) are obtained utilizing NASCET criteria, using the distal internal carotid diameter as the denominator.  RADIATION DOSE REDUCTION: This exam was performed according to the departmental dose-optimization program which includes automated exposure control, adjustment of the mA and/or kV according to Maria [name] size and/or use of iterative reconstruction technique.  CONTRAST:  75mL OMNIPAQUE IOHEXOL 350 MG/ML SOLN  COMPARISON:  Report of carotid ultrasound on 01/30/2023  FINDINGS: Skeleton: Absent dentition. Cervical spine degeneration with previous C6-C7 ACDF. No acute osseous abnormality identified.  Upper chest: Moderate to severe bilateral emphysema. Negative visible mediastinum.  Other neck: Glottis is closed. No acute finding identified. Grossly negative visible posterior and middle cranial fossa. Negative orbits.  Aortic arch: Calcified aortic atherosclerosis.  Three vessel arch.  Right carotid system: Brachiocephalic artery soft and calcified plaque without stenosis. No involvement of the right CCA origin. Mild soft plaque in the ventral right CCA before the bifurcation without stenosis. Relatively mild calcified plaque at the right ICA origin. Mild to moderate mixed soft and calcified plaque at  the distal right  ICA bulb, but less than 50 % stenosis with respect to the distal vessel.  Left carotid system: Left CCA origin calcified plaque without stenosis. Medial calcified plaque before the bifurcation without stenosis. Bulky calcification throughout the left carotid bifurcation with short segment string sign stenosis series 5, image 47, series 8, image 123. The left ICA remains patent and calcified plaque continues into the posterior left bulb. Distal to the bulb no significant plaque or stenosis to the skull base.  Vertebral arteries: Proximal right subclavian artery atherosclerosis with high-grade stenosis on series 8, image 144 and series 6, image 180, numerically estimated at 80 % with respect to the distal vessel, approaching a string sign.  Right vertebral artery is diminutive but patent. Right vertebral origin appears normal. The right vertebral appears non dominant on a congenital basis (series 6, image 85) and remains patent to the skull base.  Proximal left subclavian artery mild-to-moderate plaque without stenosis. Dominant left vertebral artery origin is normal. Left vertebral mild V1 and V2 segment calcified plaque. The left vertebral remains patent to the skull base with no significant stenosis.  Limited intracranial:  Posterior circulation: Dominant left vertebral artery with normal PICA origin and patent vertebrobasilar junction. Right vertebral V4 segment is highly diminutive but remains patent to the vertebrobasilar junction. The basilar artery is highly diminutive and incompletely visible.  Anterior circulation: Both visible ICA siphons are patent with mild right greater than left calcified plaque.  Anatomic variants: Dominant left and diminutive right vertebral arteries. Diminutive basilar artery, incompletely evaluated.  Review of the MIP images confirms the above findings  IMPRESSION: 1. Advanced atherosclerosis in the neck Positive for - short segment  high-grade RADIOGRAPHIC STRING SIGN Stenosis at the Left Carotid Bifurcation. Left ICA remains patent, with comparatively mild atherosclerosis at the distal bulb and in the visible left siphon. - High-grade stenosis of the proximal Right Subclavian Artery, numerically estimated at 80%.  2. Right ICA plaque with less than 50% stenosis.  3. Congenital diminutive right vertebral artery. Dominant Left Vertebral Artery with no significant stenosis. Diminutive Basilar artery which might also be congenital (such as due to fetal PCAs) but is incompletely evaluated.  4.  Aortic Atherosclerosis (ICD10-I70.0).  5. Emphysema (ICD10-J43.9).  Electronically Signed   By: Odessa Fleming M.D.   On: 02/23/2023 10:02  Assessment  The primary encounter diagnosis was Cervical fusion syndrome (C6-7). Diagnoses of Radicular pain in right arm, Cervical disc disorder with radiculopathy of cervical region (right), Chronic right shoulder pain, and Chronic pain syndrome were also pertinent to this visit.  Plan of Care  Maria [name] received short-term benefit from her cervical epidural steroid injection.  She states that for the first 8 to 9 days, she had 85% pain relief.  She has an appointment tomorrow for a carotid stent.  She will likely be on dual antiplatelet therapy for at least 6 months.  I encouraged her to follow-up with me in 6 months to see how she is doing and discuss options at that time.  I do not recommend any cervical spinal interventions so long as she is on dual antiplatelet therapy after her carotid stent.  Follow-up plan:   Return for Maria [name] will call to schedule F2F appt prn.      Future interventional options: -Right cervical ESI 12/14/22 -Cervical facet medial branch nerve blocks -Glenohumeral joint injection -Suprascapular nerve block  Also consider trial of Lyrica     Recent Visits Date Type Provider Dept  02/06/23 Procedure visit  Edward Jolly, MD [facility]  01/11/23  Office Visit Edward Jolly, MD [facility]  12/14/22 Procedure visit Edward Jolly, MD [facility]  Showing recent visits within past 90 days and meeting all other requirements Today's Visits Date Type Provider Dept  03/06/23 Office Visit Edward Jolly, MD [facility]  Showing today's visits and meeting all other requirements Future Appointments No visits were found meeting these conditions. Showing future appointments within next 90 days and meeting all other requirements  I discussed the assessment and treatment plan with the Maria [name]. The Maria [name] was provided an opportunity to ask questions and all were answered. The Maria [name] agreed with the plan and demonstrated an understanding of the instructions.  Maria [name] advised to call back or seek an in-person evaluation if the symptoms or condition worsens.  Duration of encounter: .  Note by: Edward Jolly, MD Date: 03/06/2023; Time: 3:30 PM

## 2023-03-07 ENCOUNTER — Other Ambulatory Visit: Payer: Self-pay

## 2023-03-07 ENCOUNTER — Inpatient Hospital Stay
Admission: RE | Admit: 2023-03-07 | Discharge: 2023-03-10 | DRG: 039 | Disposition: A | Discharge status: Home / Self Care | Payer: Medicare HMO | Attending: Vascular Surgery | Admitting: Vascular Surgery

## 2023-03-07 ENCOUNTER — Encounter
Admission: RE | Disposition: A | Discharge status: Home / Self Care | Payer: Self-pay | Source: Home / Self Care | Attending: Vascular Surgery

## 2023-03-07 ENCOUNTER — Encounter: Payer: Self-pay | Admitting: Vascular Surgery

## 2023-03-07 DIAGNOSIS — Z841 Family history of disorders of kidney and ureter: Secondary | ICD-10-CM | POA: Diagnosis not present

## 2023-03-07 DIAGNOSIS — Q2549 Other congenital malformations of aorta: Secondary | ICD-10-CM | POA: Diagnosis not present

## 2023-03-07 DIAGNOSIS — E782 Mixed hyperlipidemia: Secondary | ICD-10-CM | POA: Diagnosis present

## 2023-03-07 DIAGNOSIS — I70213 Atherosclerosis of native arteries of extremities with intermittent claudication, bilateral legs: Secondary | ICD-10-CM | POA: Diagnosis present

## 2023-03-07 DIAGNOSIS — I10 Essential (primary) hypertension: Secondary | ICD-10-CM | POA: Diagnosis not present

## 2023-03-07 DIAGNOSIS — Z801 Family history of malignant neoplasm of trachea, bronchus and lung: Secondary | ICD-10-CM | POA: Diagnosis not present

## 2023-03-07 DIAGNOSIS — Z8249 Family history of ischemic heart disease and other diseases of the circulatory system: Secondary | ICD-10-CM

## 2023-03-07 DIAGNOSIS — Z803 Family history of malignant neoplasm of breast: Secondary | ICD-10-CM

## 2023-03-07 DIAGNOSIS — Z87891 Personal history of nicotine dependence: Secondary | ICD-10-CM | POA: Diagnosis not present

## 2023-03-07 DIAGNOSIS — Z825 Family history of asthma and other chronic lower respiratory diseases: Secondary | ICD-10-CM

## 2023-03-07 DIAGNOSIS — G894 Chronic pain syndrome: Secondary | ICD-10-CM | POA: Diagnosis not present

## 2023-03-07 DIAGNOSIS — U099 Post covid-19 condition, unspecified: Secondary | ICD-10-CM | POA: Diagnosis present

## 2023-03-07 DIAGNOSIS — J449 Chronic obstructive pulmonary disease, unspecified: Secondary | ICD-10-CM

## 2023-03-07 DIAGNOSIS — Z9981 Dependence on supplemental oxygen: Secondary | ICD-10-CM

## 2023-03-07 DIAGNOSIS — Z8673 Personal history of transient ischemic attack (TIA), and cerebral infarction without residual deficits: Secondary | ICD-10-CM | POA: Diagnosis not present

## 2023-03-07 DIAGNOSIS — Z981 Arthrodesis status: Secondary | ICD-10-CM

## 2023-03-07 DIAGNOSIS — I6522 Occlusion and stenosis of left carotid artery: Secondary | ICD-10-CM | POA: Diagnosis not present

## 2023-03-07 DIAGNOSIS — I959 Hypotension, unspecified: Secondary | ICD-10-CM | POA: Diagnosis not present

## 2023-03-07 DIAGNOSIS — Z833 Family history of diabetes mellitus: Secondary | ICD-10-CM

## 2023-03-07 DIAGNOSIS — Z90710 Acquired absence of both cervix and uterus: Secondary | ICD-10-CM

## 2023-03-07 DIAGNOSIS — I2510 Atherosclerotic heart disease of native coronary artery without angina pectoris: Secondary | ICD-10-CM | POA: Diagnosis not present

## 2023-03-07 DIAGNOSIS — J439 Emphysema, unspecified: Secondary | ICD-10-CM | POA: Diagnosis present

## 2023-03-07 DIAGNOSIS — M25511 Pain in right shoulder: Secondary | ICD-10-CM | POA: Diagnosis present

## 2023-03-07 DIAGNOSIS — Z822 Family history of deafness and hearing loss: Secondary | ICD-10-CM

## 2023-03-07 DIAGNOSIS — Z7982 Long term (current) use of aspirin: Secondary | ICD-10-CM

## 2023-03-07 DIAGNOSIS — Z9049 Acquired absence of other specified parts of digestive tract: Secondary | ICD-10-CM

## 2023-03-07 DIAGNOSIS — I6523 Occlusion and stenosis of bilateral carotid arteries: Secondary | ICD-10-CM | POA: Diagnosis not present

## 2023-03-07 DIAGNOSIS — Z8261 Family history of arthritis: Secondary | ICD-10-CM | POA: Diagnosis not present

## 2023-03-07 DIAGNOSIS — Q761 Klippel-Feil syndrome: Secondary | ICD-10-CM

## 2023-03-07 DIAGNOSIS — M5010 Cervical disc disorder with radiculopathy, unspecified cervical region: Secondary | ICD-10-CM | POA: Diagnosis present

## 2023-03-07 HISTORY — PX: CAROTID PTA/STENT INTERVENTION: CATH118231

## 2023-03-07 LAB — MRSA NEXT GEN BY PCR, NASAL: MRSA by PCR Next Gen: NOT DETECTED

## 2023-03-07 LAB — POCT ACTIVATED CLOTTING TIME: Activated Clotting Time: 269 seconds

## 2023-03-07 LAB — GLUCOSE, CAPILLARY: Glucose-Capillary: 90 mg/dL (ref 70–99)

## 2023-03-07 SURGERY — CAROTID PTA/STENT INTERVENTION
Anesthesia: Moderate Sedation | Laterality: Left

## 2023-03-07 MED ORDER — LABETALOL HCL 5 MG/ML IV SOLN: 10.0000 mg | INTRAVENOUS | Status: DC | PRN

## 2023-03-07 MED ORDER — MIDAZOLAM HCL 2 MG/2ML IJ SOLN
INTRAMUSCULAR | Status: AC
  Filled 2023-03-07: qty 2

## 2023-03-07 MED ORDER — PHENYLEPHRINE HCL-NACL 20-0.9 MG/250ML-% IV SOLN
INTRAVENOUS | Status: AC
  Filled 2023-03-07: qty 250

## 2023-03-07 MED ORDER — ONDANSETRON HCL 4 MG/2ML IJ SOLN: 4.0000 mg | Freq: Four times a day (QID) | INTRAMUSCULAR | Status: DC | PRN

## 2023-03-07 MED ORDER — ACETAMINOPHEN 325 MG RE SUPP: 325.0000 mg | RECTAL | Status: DC | PRN

## 2023-03-07 MED ORDER — DOPAMINE-DEXTROSE 3.2-5 MG/ML-% IV SOLN
INTRAVENOUS | Status: AC
  Filled 2023-03-07: qty 250

## 2023-03-07 MED ORDER — SODIUM CHLORIDE 0.9 % IV SOLN: 500.0000 mL | Freq: Once | INTRAVENOUS | Status: DC | PRN

## 2023-03-07 MED ORDER — PANTOPRAZOLE SODIUM 40 MG IV SOLR
40.0000 mg | Freq: Every day | INTRAVENOUS | Status: DC
  Administered 2023-03-07 – 2023-03-09 (×3): 40 mg via INTRAVENOUS
  Filled 2023-03-07 (×3): qty 10

## 2023-03-07 MED ORDER — UMECLIDINIUM BROMIDE 62.5 MCG/ACT IN AEPB
1.0000 | INHALATION_SPRAY | Freq: Every day | RESPIRATORY_TRACT | Status: DC
  Administered 2023-03-08 – 2023-03-10 (×3): 1 via RESPIRATORY_TRACT
  Filled 2023-03-07: qty 7

## 2023-03-07 MED ORDER — IPRATROPIUM-ALBUTEROL 0.5-2.5 (3) MG/3ML IN SOLN: 3.0000 mL | Freq: Four times a day (QID) | RESPIRATORY_TRACT | Status: DC | PRN

## 2023-03-07 MED ORDER — HEPARIN SODIUM (PORCINE) 1000 UNIT/ML IJ SOLN
INTRAMUSCULAR | Status: AC
  Filled 2023-03-07: qty 10

## 2023-03-07 MED ORDER — ATORVASTATIN CALCIUM 10 MG PO TABS
10.0000 mg | ORAL_TABLET | Freq: Every day | ORAL | Status: DC
  Administered 2023-03-08 – 2023-03-10 (×3): 10 mg via ORAL
  Filled 2023-03-07 (×4): qty 1

## 2023-03-07 MED ORDER — DOCUSATE SODIUM 100 MG PO CAPS
100.0000 mg | ORAL_CAPSULE | Freq: Every day | ORAL | Status: DC
  Administered 2023-03-08 – 2023-03-10 (×3): 100 mg via ORAL
  Filled 2023-03-07 (×3): qty 1

## 2023-03-07 MED ORDER — SODIUM CHLORIDE 3 % IN NEBU
4.0000 mL | INHALATION_SOLUTION | Freq: Two times a day (BID) | RESPIRATORY_TRACT | Status: AC
  Administered 2023-03-07 – 2023-03-10 (×6): 4 mL via RESPIRATORY_TRACT
  Filled 2023-03-07 (×6): qty 4

## 2023-03-07 MED ORDER — OXYCODONE-ACETAMINOPHEN 5-325 MG PO TABS: 1.0000 | ORAL_TABLET | ORAL | Status: DC | PRN

## 2023-03-07 MED ORDER — ALUM & MAG HYDROXIDE-SIMETH 200-200-20 MG/5ML PO SUSP: 15.0000 mL | ORAL | Status: DC | PRN

## 2023-03-07 MED ORDER — HEPARIN SODIUM (PORCINE) 1000 UNIT/ML IJ SOLN
INTRAMUSCULAR | Status: DC | PRN
  Administered 2023-03-07: 8000 [IU] via INTRAVENOUS
  Administered 2023-03-07: 3000 [IU] via INTRAVENOUS

## 2023-03-07 MED ORDER — IODIXANOL 320 MG/ML IV SOLN
INTRAVENOUS | Status: DC | PRN
  Administered 2023-03-07: 65 mL

## 2023-03-07 MED ORDER — CHLORHEXIDINE GLUCONATE CLOTH 2 % EX PADS
6.0000 | MEDICATED_PAD | Freq: Every day | CUTANEOUS | Status: DC
  Administered 2023-03-08 – 2023-03-10 (×3): 6 via TOPICAL

## 2023-03-07 MED ORDER — FENTANYL CITRATE (PF) 100 MCG/2ML IJ SOLN
INTRAMUSCULAR | Status: DC | PRN
  Administered 2023-03-07 (×2): 25 ug via INTRAVENOUS

## 2023-03-07 MED ORDER — ASPIRIN 81 MG PO TBEC
81.0000 mg | DELAYED_RELEASE_TABLET | Freq: Every day | ORAL | Status: DC
  Administered 2023-03-08 – 2023-03-10 (×3): 81 mg via ORAL
  Filled 2023-03-07 (×3): qty 1

## 2023-03-07 MED ORDER — FAMOTIDINE 20 MG PO TABS: 40.0000 mg | ORAL_TABLET | Freq: Once | ORAL | Status: DC | PRN

## 2023-03-07 MED ORDER — ATROPINE SULFATE 1 MG/10ML IJ SOSY
PREFILLED_SYRINGE | INTRAMUSCULAR | Status: AC
  Filled 2023-03-07: qty 20

## 2023-03-07 MED ORDER — SODIUM CHLORIDE 0.9 % IV SOLN: INTRAVENOUS | Status: DC

## 2023-03-07 MED ORDER — BENZONATATE 100 MG PO CAPS: 200.0000 mg | ORAL_CAPSULE | Freq: Three times a day (TID) | ORAL | Status: DC | PRN

## 2023-03-07 MED ORDER — CEFAZOLIN SODIUM-DEXTROSE 2-4 GM/100ML-% IV SOLN
2.0000 g | INTRAVENOUS | Status: AC
  Administered 2023-03-07: 2 g via INTRAVENOUS

## 2023-03-07 MED ORDER — ACETAMINOPHEN 325 MG PO TABS
325.0000 mg | ORAL_TABLET | ORAL | Status: DC | PRN
  Administered 2023-03-09: 650 mg via ORAL
  Filled 2023-03-07: qty 2

## 2023-03-07 MED ORDER — CEFAZOLIN SODIUM-DEXTROSE 2-4 GM/100ML-% IV SOLN
INTRAVENOUS | Status: AC
  Filled 2023-03-07: qty 100

## 2023-03-07 MED ORDER — METOPROLOL TARTRATE 5 MG/5ML IV SOLN: 2.0000 mg | INTRAVENOUS | Status: DC | PRN

## 2023-03-07 MED ORDER — ESCITALOPRAM OXALATE 20 MG PO TABS
20.0000 mg | ORAL_TABLET | Freq: Every day | ORAL | Status: DC
  Administered 2023-03-08 – 2023-03-10 (×3): 20 mg via ORAL
  Filled 2023-03-07 (×4): qty 1

## 2023-03-07 MED ORDER — ARFORMOTEROL TARTRATE 15 MCG/2ML IN NEBU
15.0000 ug | INHALATION_SOLUTION | Freq: Two times a day (BID) | RESPIRATORY_TRACT | Status: DC
  Administered 2023-03-07 – 2023-03-10 (×6): 15 ug via RESPIRATORY_TRACT
  Filled 2023-03-07 (×6): qty 2

## 2023-03-07 MED ORDER — HYDROXYZINE HCL 25 MG PO TABS
25.0000 mg | ORAL_TABLET | Freq: Every evening | ORAL | Status: DC | PRN
  Administered 2023-03-08 – 2023-03-09 (×2): 25 mg via ORAL
  Filled 2023-03-07 (×2): qty 1

## 2023-03-07 MED ORDER — MIDAZOLAM HCL 2 MG/ML PO SYRP: 8.0000 mg | ORAL_SOLUTION | Freq: Once | ORAL | Status: DC | PRN

## 2023-03-07 MED ORDER — ROFLUMILAST 500 MCG PO TABS
500.0000 ug | ORAL_TABLET | Freq: Every day | ORAL | Status: DC
  Administered 2023-03-08 – 2023-03-10 (×3): 500 ug via ORAL
  Filled 2023-03-07 (×3): qty 1

## 2023-03-07 MED ORDER — ORAL CARE MOUTH RINSE: 15.0000 mL | OROMUCOSAL | Status: DC | PRN

## 2023-03-07 MED ORDER — CEFAZOLIN SODIUM-DEXTROSE 2-4 GM/100ML-% IV SOLN
2.0000 g | Freq: Three times a day (TID) | INTRAVENOUS | Status: AC
  Administered 2023-03-07 – 2023-03-08 (×2): 2 g via INTRAVENOUS
  Filled 2023-03-07 (×2): qty 100

## 2023-03-07 MED ORDER — BUDESONIDE 0.25 MG/2ML IN SUSP
0.2500 mg | Freq: Two times a day (BID) | RESPIRATORY_TRACT | Status: DC
  Administered 2023-03-07 – 2023-03-10 (×6): 0.25 mg via RESPIRATORY_TRACT
  Filled 2023-03-07 (×6): qty 2

## 2023-03-07 MED ORDER — ATROPINE SULFATE 1 MG/10ML IJ SOSY
PREFILLED_SYRINGE | INTRAMUSCULAR | Status: DC | PRN
  Administered 2023-03-07: 1 mg via INTRAVENOUS

## 2023-03-07 MED ORDER — HEPARIN (PORCINE) IN NACL 1000-0.9 UT/500ML-% IV SOLN
INTRAVENOUS | Status: DC | PRN
  Administered 2023-03-07: 3000 mL

## 2023-03-07 MED ORDER — GABAPENTIN 300 MG PO CAPS
600.0000 mg | ORAL_CAPSULE | Freq: Three times a day (TID) | ORAL | Status: DC
  Administered 2023-03-07 – 2023-03-10 (×10): 600 mg via ORAL
  Filled 2023-03-07 (×10): qty 2

## 2023-03-07 MED ORDER — BUDESON-GLYCOPYRROL-FORMOTEROL 160-9-4.8 MCG/ACT IN AERO: 2.0000 | INHALATION_SPRAY | Freq: Two times a day (BID) | RESPIRATORY_TRACT | Status: DC

## 2023-03-07 MED ORDER — MIDAZOLAM HCL 2 MG/2ML IJ SOLN
INTRAMUSCULAR | Status: DC | PRN
  Administered 2023-03-07: 1 mg via INTRAVENOUS
  Administered 2023-03-07: 2 mg via INTRAVENOUS

## 2023-03-07 MED ORDER — HYDRALAZINE HCL 20 MG/ML IJ SOLN: 5.0000 mg | INTRAMUSCULAR | Status: DC | PRN

## 2023-03-07 MED ORDER — AZELASTINE HCL 0.1 % NA SOLN
1.0000 | Freq: Two times a day (BID) | NASAL | Status: DC
  Administered 2023-03-07 – 2023-03-10 (×6): 1 via NASAL
  Filled 2023-03-07: qty 30

## 2023-03-07 MED ORDER — PHENOL 1.4 % MT LIQD: 1.0000 | OROMUCOSAL | Status: DC | PRN

## 2023-03-07 MED ORDER — GUAIFENESIN-DM 100-10 MG/5ML PO SYRP: 15.0000 mL | ORAL_SOLUTION | ORAL | Status: DC | PRN

## 2023-03-07 MED ORDER — OXYBUTYNIN CHLORIDE ER 10 MG PO TB24
10.0000 mg | ORAL_TABLET | Freq: Every day | ORAL | Status: DC
  Administered 2023-03-08 – 2023-03-10 (×3): 10 mg via ORAL
  Filled 2023-03-07 (×4): qty 1

## 2023-03-07 MED ORDER — LIDOCAINE HCL (PF) 1 % IJ SOLN
INTRAMUSCULAR | Status: DC | PRN
  Administered 2023-03-07: 10 mL

## 2023-03-07 MED ORDER — ALBUTEROL SULFATE HFA 108 (90 BASE) MCG/ACT IN AERS: 2.0000 | INHALATION_SPRAY | Freq: Four times a day (QID) | RESPIRATORY_TRACT | Status: DC | PRN

## 2023-03-07 MED ORDER — MORPHINE SULFATE (PF) 4 MG/ML IV SOLN: 2.0000 mg | INTRAVENOUS | Status: DC | PRN

## 2023-03-07 MED ORDER — FENTANYL CITRATE (PF) 100 MCG/2ML IJ SOLN
INTRAMUSCULAR | Status: AC
  Filled 2023-03-07: qty 2

## 2023-03-07 MED ORDER — DIPHENHYDRAMINE HCL 50 MG/ML IJ SOLN: 50.0000 mg | Freq: Once | INTRAMUSCULAR | Status: DC | PRN

## 2023-03-07 MED ORDER — PHENYLEPHRINE 80 MCG/ML (10ML) SYRINGE FOR IV PUSH (FOR BLOOD PRESSURE SUPPORT)
PREFILLED_SYRINGE | INTRAVENOUS | Status: AC
  Filled 2023-03-07: qty 10

## 2023-03-07 MED ORDER — DOPAMINE-DEXTROSE 3.2-5 MG/ML-% IV SOLN
0.0000 ug/kg/min | INTRAVENOUS | Status: DC
  Administered 2023-03-07: 3 ug/kg/min via INTRAVENOUS
  Administered 2023-03-08: 8 ug/kg/min via INTRAVENOUS
  Administered 2023-03-09: 5 ug/kg/min via INTRAVENOUS
  Filled 2023-03-07: qty 250

## 2023-03-07 MED ORDER — POTASSIUM CHLORIDE IN NACL 20-0.9 MEQ/L-% IV SOLN
INTRAVENOUS | Status: DC
  Filled 2023-03-07 (×5): qty 1000

## 2023-03-07 MED ORDER — METHYLPREDNISOLONE SODIUM SUCC 125 MG IJ SOLR: 125.0000 mg | Freq: Once | INTRAMUSCULAR | Status: DC | PRN

## 2023-03-07 MED ORDER — CLOPIDOGREL BISULFATE 75 MG PO TABS
75.0000 mg | ORAL_TABLET | Freq: Every day | ORAL | Status: DC
  Administered 2023-03-08 – 2023-03-10 (×3): 75 mg via ORAL
  Filled 2023-03-07 (×3): qty 1

## 2023-03-07 MED ORDER — HYDROMORPHONE HCL 1 MG/ML IJ SOLN: 1.0000 mg | Freq: Once | INTRAMUSCULAR | Status: DC | PRN

## 2023-03-07 SURGICAL SUPPLY — 30 items
BALLN TREK RX 3.0X15 (BALLOONS) ×1
BALLN VIATRAC 4X30X135 (BALLOONS) ×1
BALLN VIATRAC 5X40X135 (BALLOONS) ×1
BALLOON TREK RX 3.0X15 (BALLOONS) IMPLANT
BALLOON VIATRAC 4X30X135 (BALLOONS) IMPLANT
BALLOON VIATRAC 5X40X135 (BALLOONS) IMPLANT
CATH ANGIO 5F PIGTAIL 100CM (CATHETERS) IMPLANT
CATH ANGIO 5F SIM1 100CM (CATHETERS) IMPLANT
CATH HEADHUNTER H1 5F 100CM (CATHETERS) IMPLANT
COVER DRAPE FLUORO 36X44 (DRAPES) IMPLANT
COVER PROBE ULTRASOUND 5X96 (MISCELLANEOUS) IMPLANT
DEVICE EMBOSHIELD NAV6 4.0-7.0 (FILTER) IMPLANT
DEVICE SAFEGUARD 24CM (GAUZE/BANDAGES/DRESSINGS) IMPLANT
DEVICE STARCLOSE SE CLOSURE (Vascular Products) IMPLANT
DEVICE TORQUE (MISCELLANEOUS) IMPLANT
GLIDEWIRE ANGLED SS 035X260CM (WIRE) IMPLANT
GLIDEWIRE STIFF .35X180X3 HYDR (WIRE) IMPLANT
KIT CAROTID MANIFOLD (MISCELLANEOUS) IMPLANT
KIT ENCORE 26 ADVANTAGE (KITS) IMPLANT
NDL ENTRY 21GA 7CM ECHOTIP (NEEDLE) IMPLANT
NEEDLE ENTRY 21GA 7CM ECHOTIP (NEEDLE) ×1 IMPLANT
PACK ANGIOGRAPHY (CUSTOM PROCEDURE TRAY) ×1 IMPLANT
SET INTRO CAPELLA COAXIAL (SET/KITS/TRAYS/PACK) IMPLANT
SHEATH BRITE TIP 5FRX11 (SHEATH) IMPLANT
SHEATH SHUTTLE 6FRX80 (SHEATH) IMPLANT
STENT XACT CAR 8-6X40X136 (Permanent Stent) IMPLANT
SUT MNCRL AB 4-0 PS2 18 (SUTURE) IMPLANT
SYR MEDRAD MARK 7 150ML (SYRINGE) IMPLANT
WIRE G VAS 035X260 STIFF (WIRE) IMPLANT
WIRE GUIDERIGHT .035X150 (WIRE) IMPLANT

## 2023-03-07 NOTE — Progress Notes (Signed)
 An USGPIV (ultrasound guided PIV) has been placed for short-term vasopressor infusion. A correctly placed ivWatch must be used when administering Vasopressors. Should this treatment be needed beyond 24 hours, central line access should be obtained.  It will be the responsibility of the bedside nurse to follow best practice to prevent extravasations.

## 2023-03-07 NOTE — Progress Notes (Signed)
Post Procedure Note:  Patient is resting comfortably. Supported on 3 mcg of dopamine to assist with soft blood pressure. Now 92/53 MAP 66. Patient has no complaints. PAD device in place but oozing. PAD removed. Puncture site oozing. Under sterile procedure area was prepped with chlorhexidine and a sterile drape, single suture used to close the opened puncture site at the skin to stop the oozing. No hematoma or oozing after suture placed to note. Vitals all remain stable. Pressure dressing was reapplied.

## 2023-03-07 NOTE — Interval H&P Note (Signed)
History and Physical Interval Note:  03/07/2023 8:09 AM  Maria [name]  has presented today for surgery, with the diagnosis of L Carotid Stent   ABBOTT   Carotid artery stenosis.  The various methods of treatment have been discussed with the patient and family. After consideration of risks, benefits and other options for treatment, the patient has consented to  Procedure(s): CAROTID PTA/STENT INTERVENTION (Left) as a surgical intervention.  The patient's history has been reviewed, patient examined, no change in status, stable for surgery.  I have reviewed the patient's chart and labs.  Questions were answered to the patient's satisfaction.     Levora Dredge

## 2023-03-07 NOTE — Op Note (Signed)
OPERATIVE NOTE DATE: 03/07/2023  PROCEDURE:  Ultrasound guidance for vascular access right femoral artery Angioplasty of the left internal carotid artery to 5 mm with the use of a Nav 6 embolic protection device.   Attempted placement of a 6 x 8 x 40 exact stent with the use of the NAV-6 embolic protection device in the left carotid artery  PRE-OPERATIVE DIAGNOSIS: 1.  Symptomatic critical left internal carotid artery stenosis. 2.  TIA  POST-OPERATIVE DIAGNOSIS:  Same as above  SURGEON: Levora Dredge  ASSISTANT(S): None  ANESTHESIA: local/MCS  ESTIMATED BLOOD LOSS: 200 cc  CONTRAST: 65 cc  FLUORO TIME: 13.9 minutes  MODERATE CONSCIOUS SEDATION TIME: Continuous ECG pulse oximetry and cardiopulmonary monitoring was performed throughout the entire procedure by the interventional radiology nurse total sedation time was 70 minutes  FINDING(S): 1.   Greater than 95% carotid artery stenosis  SPECIMEN(S):   none  INDICATIONS:   Patient is a 67 y.o. female who presents with a symptomatic left internal carotid artery stenosis.  The patient has advanced COPD and is on home oxygen as well as immobility of her neck status post cervical spine fixation and carotid artery stenting was felt to be preferred to endarterectomy for that reason.  Risks and benefits were discussed and informed consent was obtained.   DESCRIPTION: After obtaining full informed written consent, the patient was brought back to the vascular suite and placed supine upon the table.  The patient received IV antibiotics prior to induction. Moderate conscious sedation was administered during a face to face encounter with the patient throughout the procedure with my supervision of the RN administering medicines and monitoring the patients vital signs and mental status throughout from the start of the procedure until the patient was taken to the recovery room.    After obtaining adequate sedation, the patient was prepped  and draped in the standard fashion.    A first assistant is required in order to allow for a safe and more efficient operation.  Duties include wire manipulations as well as assistance with pinning the sheath and positioning the detector for proper angle, assistance and deploying the stent in the proper position and appropriate images.  Further duties include assisting with patient positioning during the procedure.  I believe that this procedure requires a first assistant in order for it to be performed at a level in keeping with the high standards of this institution.  The right femoral artery was visualized with ultrasound and found to be widely patent. It was then accessed under direct ultrasound guidance without difficulty with a micropuncture needle. A permanent image was recorded.  A microwire was then advanced without difficulty under fluoroscopic guidance followed by a micro-sheath.  A J-wire was placed and we then placed a 6 French sheath. The patient was then heparinized and a total of 8000 units of intravenous heparin were given and an ACT was checked to confirm successful anticoagulation an additional 3000 units was given after the ACT returned at 267.   A pigtail catheter was then placed into the ascending aorta. This showed type II arch with bovine anatomy. The left common  artery was then selectively cannulated without difficulty with a Simmons 1 catheter and the catheter advanced into the mid left common carotid artery.  Cervical and cerebral carotid angiography was then performed. There were no obvious intracranial filling defects. The carotid bifurcation demonstrated greater than 95% stenosis of the left internal carotid artery at its origin extending into the carotid bifurcation.  I then advanced into the external carotid artery with a Glidewire and the Simmons 1 catheter and then exchanged for the Amplatz Super Stiff wire. Over the Amplatz Super Stiff wire, a 6 [place] shuttle sheath was  placed into the mid common carotid artery. I then attempted to get the NAV-6  Embolic protection device across the lesion.  However, the delivery system for the Nav 6 would not cross this lesion and therefore a 3 mm x 15 mm balloon was utilized and inflated to 10 atm.  Following this maneuver the embolic protection did advanced across the lesion and I parked this in the distal internal carotid artery at the base of the skull.  I then selected a 6 x 8 x 40 Exact stent.  Once again, this would not cross the lesion and the stent was removed and a 4 mm balloon was used to predilate the lesion at this point embolic protection was up.  The exact stent was then attempted to cross the lesion but still would not and therefore a 5 mm balloon was advanced across the lesion inflated to 12 atm.  A third attempt at passing the exact stent was unsuccessful and I elected to terminate the case as I did not feel it was appropriate to inflate a 6 mm balloon which was clearly oversized for the carotid artery.  Follow-up imaging was then performed in the usual fashion and about a 50% % residual stenosis was present after angioplasty. Completion angiogram showed normal intracranial filling without new defects. The sheath was removed and StarClose closure device was deployed in the right femoral artery with excellent hemostatic result. The patient was taken to the recovery room in stable condition having tolerated the procedure well.  COMPLICATIONS: none  CONDITION: stable  Levora Dredge 03/07/2023 9:55 AM   This note was created with Dragon Medical transcription system. Any errors in dictation are purely unintentional.

## 2023-03-07 NOTE — Progress Notes (Signed)
The puncture site  on right groin is open and small pull of blood visible, MD notified, Rolla Plate NP came removed the PAD and placed a stitch in the skin at the puncture site in a sterile procedure and pressure dressing applied and pt was instructed to call if she has any warm sensation around her groin or pelvic area, pt stated understanding.

## 2023-03-08 ENCOUNTER — Ambulatory Visit: Payer: Medicare HMO | Admitting: Physician Assistant

## 2023-03-08 ENCOUNTER — Encounter: Payer: Self-pay | Admitting: Vascular Surgery

## 2023-03-08 LAB — CBC
HCT: 31.9 % — ABNORMAL LOW (ref 36.0–46.0)
Hemoglobin: 10.4 g/dL — ABNORMAL LOW (ref 12.0–15.0)
MCH: 30.6 pg (ref 26.0–34.0)
MCHC: 32.6 g/dL (ref 30.0–36.0)
MCV: 93.8 fL (ref 80.0–100.0)
Platelets: 285 10*3/uL (ref 150–400)
RBC: 3.4 MIL/uL — ABNORMAL LOW (ref 3.87–5.11)
RDW: 12.6 % (ref 11.5–15.5)
WBC: 12 10*3/uL — ABNORMAL HIGH (ref 4.0–10.5)
nRBC: 0 % (ref 0.0–0.2)

## 2023-03-08 LAB — BASIC METABOLIC PANEL
Anion gap: 6 (ref 5–15)
BUN: 8 mg/dL (ref 8–23)
CO2: 27 mmol/L (ref 22–32)
Calcium: 8.5 mg/dL — ABNORMAL LOW (ref 8.9–10.3)
Chloride: 105 mmol/L (ref 98–111)
Creatinine, Ser: 0.6 mg/dL (ref 0.44–1.00)
GFR, Estimated: >60 mL/min (ref >60)
Glucose, Bld: 130 mg/dL — ABNORMAL HIGH (ref 70–99)
Potassium: 3.3 mmol/L — ABNORMAL LOW (ref 3.5–5.1)
Sodium: 138 mmol/L (ref 135–145)

## 2023-03-08 MED ORDER — POTASSIUM CHLORIDE CRYS ER 20 MEQ PO TBCR
20.0000 meq | EXTENDED_RELEASE_TABLET | Freq: Once | ORAL | Status: AC
  Administered 2023-03-08: 20 meq via ORAL
  Filled 2023-03-08: qty 1

## 2023-03-08 MED ORDER — SODIUM CHLORIDE 0.9 % IV BOLUS
500.0000 mL | Freq: Once | INTRAVENOUS | Status: AC
  Administered 2023-03-08: 500 mL via INTRAVENOUS

## 2023-03-08 NOTE — Plan of Care (Signed)
  Pt is progressing towards goal. VSS, weaned off dopamine gtt for now. Pt up with one assist to bedside commode several times today without any syncope episode.     Problem: Clinical Measurements: Goal: Will remain free from infection Outcome: Progressing   Problem: Clinical Measurements: Goal: Respiratory complications will improve Outcome: Progressing   Problem: Coping: Goal: Level of anxiety will decrease Outcome: Progressing   Problem: Nutrition: Goal: Adequate nutrition will be maintained Outcome: Progressing   Problem: Activity: Goal: Risk for activity intolerance will decrease Outcome: Progressing  Problem: Education: Goal: Knowledge of General Education information will improve Description: Including pain rating scale, medication(s)/side effects and non-pharmacologic comfort measures Outcome: Progressing   Problem: Health Behavior/Discharge Planning: Goal: Ability to manage health-related needs will improve Outcome: Progressing

## 2023-03-08 NOTE — Progress Notes (Signed)
 Vein and Vascular Surgery  Daily Progress Note   Subjective  - 1 Day Post-Op  Patient did have a syncopal episode last night when standing to go to the bathroom.  She continues on dopamine for blood pressure support.  Blood pressures have been a little on the soft side today and it has been difficult to wean the dopamine.  We will continue.  Objective Vitals:   03/08/23 1320 03/08/23 1330 03/08/23 1400 03/08/23 1430  BP: (!) 98/56 (!) 86/54 (!) 83/53 (!) 98/56  Pulse: 60 (!) 56 60 (!) 54  Resp: 18 (!) 21 18 20   Temp: 98.6 F (37 C)     TempSrc:      SpO2: 100% 100% 100% 100%  Weight:      Height:        Intake/Output Summary (Last 24 hours) at 03/08/2023 1802 Last data filed at 03/08/2023 1457 Gross per 24 hour  Intake 2555.65 ml  Output 4700 ml  Net -2144.35 ml    PULM  Normal effort , no use of accessory muscles CV  No JVD, RRR Abd      No distended, nontender VASC  neurologically patient is intact  Laboratory CBC    Component Value Date/Time   WBC 12.0 (H) 03/08/2023 0442   HGB 10.4 (L) 03/08/2023 0442   HGB 13.6 08/22/2019 1359   HCT 31.9 (L) 03/08/2023 0442   HCT 39.3 08/22/2019 1359   PLT 285 03/08/2023 0442   PLT 381 08/22/2019 1359    BMET    Component Value Date/Time   NA 138 03/08/2023 0442   K 3.3 (L) 03/08/2023 0442   CL 105 03/08/2023 0442   CO2 27 03/08/2023 0442   GLUCOSE 130 (H) 03/08/2023 0442   BUN 8 03/08/2023 0442   CREATININE 0.60 03/08/2023 0442   CALCIUM 8.5 (L) 03/08/2023 0442   GFRNONAA >60 03/08/2023 0442   GFRAA >60 03/03/2019 0154    Assessment/Planning: POD # 1 s/p left carotid angioplasty with attempted stent placement  Will continue to wean the dopamine.  Hopefully she will be able to go home tomorrow.   Levora Dredge  03/08/2023, 6:02 PM

## 2023-03-08 NOTE — Progress Notes (Signed)
Patient called RN to room for assistance voiding in the [facility]. After voiding, patient stood to transfer back into bed and nearly had syncopal episode, losing balance, requiring RN to support the patient and guide back into sitting position on BSC. RN called for additional staff assistance and transferred patient safely back into bed. Upon checking the patient's BP, it was found to be low, but gradually improved after laying in a semi-fowler position. Patient is currently on a dopamine infusion to maintain a goal MAP of > 65 and SBP > 100.  Carmel Sacramento, RN

## 2023-03-09 MED ORDER — MIDODRINE HCL 5 MG PO TABS
5.0000 mg | ORAL_TABLET | Freq: Two times a day (BID) | ORAL | Status: DC
  Administered 2023-03-09 – 2023-03-10 (×2): 5 mg via ORAL
  Filled 2023-03-09 (×2): qty 1

## 2023-03-09 MED ORDER — POTASSIUM CHLORIDE CRYS ER 20 MEQ PO TBCR
20.0000 meq | EXTENDED_RELEASE_TABLET | Freq: Once | ORAL | Status: AC
  Administered 2023-03-09: 20 meq via ORAL
  Filled 2023-03-09: qty 1

## 2023-03-09 NOTE — Progress Notes (Signed)
[place] [facility]  Daily Progress Note   Subjective  - 2 Days Post-Op  Patient awake and alert somewhat frustrated but appropriately so.  Still trying to wean her dopamine but her BP remains fairly labile and she does dip down every time we tried to wean the dopamine.  Objective Vitals:   03/09/23 1645 03/09/23 1700 03/09/23 1715 03/09/23 1730  BP: (!) 140/55 (!) 143/59 (!) 119/42 (!) 137/47  Pulse: 63 (!) 56 66 (!) 55  Resp: 13 18 20 17   Temp:      TempSrc:      SpO2: 99% 100% 98% 100%  Weight:      Height:        Intake/Output Summary (Last 24 hours) at 03/09/2023 1735 Last data filed at 03/09/2023 1708 Gross per 24 hour  Intake 1613.99 ml  Output 3150 ml  Net -1536.01 ml    PULM  Normal effort , no use of accessory muscles CV  No JVD, RRR Abd      No distended, nontender VASC  neuro intact  Laboratory CBC    Component Value Date/Time   WBC 12.0 (H) 03/08/2023 0442   HGB 10.4 (L) 03/08/2023 0442   HGB 13.6 08/22/2019 1359   HCT 31.9 (L) 03/08/2023 0442   HCT 39.3 08/22/2019 1359   PLT 285 03/08/2023 0442   PLT 381 08/22/2019 1359    BMET    Component Value Date/Time   NA 138 03/08/2023 0442   K 3.3 (L) 03/08/2023 0442   CL 105 03/08/2023 0442   CO2 27 03/08/2023 0442   GLUCOSE 130 (H) 03/08/2023 0442   BUN 8 03/08/2023 0442   CREATININE 0.60 03/08/2023 0442   CALCIUM 8.5 (L) 03/08/2023 0442   GFRNONAA >60 03/08/2023 0442   GFRAA >60 03/03/2019 0154    Assessment/Planning: POD # 2 s/p left carotid angioplasty  We will continue to wean the dopamine.  I have now added midodrine.  I am hopeful that by tomorrow she will be off dopamine and can be DC'd to home   Levora Dredge  03/09/2023, 5:35 PM

## 2023-03-09 NOTE — Progress Notes (Addendum)
Per Dr. Gilda Crease  okay to decrease dopamine to and continue to titrate to wean patient off of dopamine drip.

## 2023-03-09 NOTE — Progress Notes (Addendum)
Attempted to wean dopamine per md request. Dopamine was infusing at , however unable to maintain map of greater than 65. Map was ranging 54-62. Had to increase dopamine back up to 7.41mcg. Made Dr. Gilda Crease aware

## 2023-03-10 MED ORDER — MIDODRINE HCL 10 MG PO TABS: 10.0000 mg | ORAL_TABLET | Freq: Two times a day (BID) | ORAL | 2 refills | Status: DC

## 2023-03-10 MED ORDER — MIDODRINE HCL 5 MG PO TABS
5.0000 mg | ORAL_TABLET | Freq: Once | ORAL | Status: AC
  Administered 2023-03-10: 5 mg via ORAL
  Filled 2023-03-10: qty 1

## 2023-03-10 MED ORDER — MIDODRINE HCL 5 MG PO TABS: 10.0000 mg | ORAL_TABLET | Freq: Two times a day (BID) | ORAL | Status: DC

## 2023-03-10 NOTE — Plan of Care (Signed)

## 2023-03-10 NOTE — Discharge Summary (Signed)
[facility]    Discharge Summary    Patient ID:  Maria [name] MRN: [id number] DOB/AGE: [birth year] 67 y.o.  Admit date: 03/07/2023 Discharge date: 03/10/2023 Date of Surgery: 03/07/2023 Surgeon: Surgeon(s): Schnier, Latina Craver, MD  Admission Diagnosis: Carotid stenosis, symptomatic w/o infarct, left [I65.22]  Discharge Diagnoses:  Carotid stenosis, symptomatic w/o infarct, left [I65.22]  Secondary Diagnoses: Past Medical History:  Diagnosis Date   Atherosclerosis 1/0612014   aorta, iliacs and CFA bilaterally no greater than 0-49% - lower arterial doppler.   Blood transfusion without reported diagnosis 2005   Cancer [facility]) Ovarian 1978   Chest pain    COPD (chronic obstructive pulmonary disease) (HCC)    COVID-19 long hauler manifesting chronic dyspnea    Emphysema of lung (HCC) 2019   Oxygen deficiency 03/2019   Oxygen dependent    3 liters up to 5    Procedure(s): CAROTID PTA/STENT INTERVENTION  Discharged Condition: good  HPI:  Maria [name] is a 67 year old female that presented to [facility] on 03/07/2023 for carotid stent placement to the left ICA due to a greater than 95% stenosis.  The procedure was uncomplicated however the patient subsequently developed hypotension with systolic pressures in the 80s and 90s.  Given the risk for stroke with hypotensive episodes following carotid stent placement measures were taken to improve her blood pressure including the dopamine drip.  Last night she was successfully able to be removed from the drip however this morning she began to notice worsening blood pressure again.  She was placed on midodrine yesterday and it was increased this morning.  She has been doing well this morning without any significant episodes of hypotension.  The patient is actually ambulated without any significant issue.  Hospital Course:  Maria [name] is a 67 y.o. female is S/P Left Carotid stent Extubated:  POD # 0 Physical Exam:  Alert notes x3, no acute distress Face: Symmetrical.  Tongue is midline. Neck: Trachea is midline.  No swelling or bruising. Cardiovascular: Regular rate and rhythm Pulmonary: Clear to auscultation bilaterally Abdomen: Soft, nontender, nondistended Right groin access: Clean dry and intact.  No swelling or drainage noted Left groin access: Clean dry and intact.  No swelling or drainage noted Left lower extremity: Thigh soft.  Calf soft.  Extremities warm distally toes.  Hard to palpate pedal pulses however the foot is warm is her good capillary refill. Right lower extremity: Thigh soft.  Calf soft.  Extremities warm distally toes.  Hard to palpate pedal pulses however the foot is warm is her good capillary refill. Neurological: No deficits noted   Post-op wounds:  clean, dry, intact or healing well  Pt. Ambulating, voiding and taking PO diet without difficulty. Pt pain controlled with PO pain meds.  Labs:  As below  Complications: Hypotension  Consults:  None  Significant Diagnostic Studies: CBC Lab Results  Component Value Date   WBC 12.0 (H) 03/08/2023   HGB 10.4 (L) 03/08/2023   HCT 31.9 (L) 03/08/2023   MCV 93.8 03/08/2023   PLT 285 03/08/2023    BMET    Component Value Date/Time   NA 138 03/08/2023 0442   K 3.3 (L) 03/08/2023 0442   CL 105 03/08/2023 0442   CO2 27 03/08/2023 0442   GLUCOSE 130 (H) 03/08/2023 0442   BUN 8 03/08/2023 0442   CREATININE 0.60 03/08/2023 0442   CALCIUM 8.5 (L) 03/08/2023 0442   GFRNONAA >60 03/08/2023 0442   GFRAA >60  03/03/2019 0154   COAG Lab Results  Component Value Date   INR 1.0 10/03/2008   INR 1.0 10/02/2008     Disposition:  Discharge to :Home  Allergies as of 03/10/2023       Reactions   Montelukast    Bad dreams        Medication List     TAKE these medications    albuterol 108 (90 Base) MCG/ACT inhaler Commonly known as: VENTOLIN HFA TAKE 2 PUFFS BY MOUTH EVERY 6 HOURS  AS NEEDED FOR WHEEZE OR SHORTNESS OF BREATH   Aspirin Low Dose 81 MG tablet Generic drug: aspirin EC TAKE 1 TABLET BY MOUTH EVERY DAY   atorvastatin 10 MG tablet Commonly known as: LIPITOR Take 1 tablet (10 mg total) by mouth daily.   Azelastine HCl 137 MCG/SPRAY Soln USE 1 SPRAY INTO EACH NOSTRIL TWICE A DAY   benzonatate 200 MG capsule Commonly known as: TESSALON Take 1 capsule by mouth three times daily as needed   Breztri Aerosphere 160-9-4.8 MCG/ACT Aero Generic drug: Budeson-Glycopyrrol-Formoterol Inhale 2 puffs into the lungs in the morning and at bedtime.   clopidogrel 75 MG tablet Commonly known as: Plavix Take 1 tablet (75 mg total) by mouth daily.   escitalopram 20 MG tablet Commonly known as: Lexapro Take 1 tablet (20 mg total) by mouth daily.   Flutter Devi Use as directed   gabapentin 600 MG tablet Commonly known as: Neurontin Take 1 tablet (600 mg total) by mouth every 8 (eight) hours.   hydrOXYzine 25 MG capsule Commonly known as: VISTARIL TAKE 1 CAPSULE BY MOUTH AT BEDTIME AS NEEDED   ipratropium-albuterol 0.5-2.5 (3) MG/3ML Soln Commonly known as: DUONEB USE 1 AMPULE IN NEBULIZER 4 TIMES A DAY AS NEEDED (DX: J84.112, J44.9)   midodrine 10 MG tablet Commonly known as: PROAMATINE Take 1 tablet (10 mg total) by mouth 2 (two) times daily with a meal.   oxybutynin 10 MG 24 hr tablet Commonly known as: DITROPAN-XL Take 1 tablet (10 mg total) by mouth daily.   OXYGEN Inhale into the lungs. 3 liters at rest and 5 liters with any activity.-   pantoprazole 40 MG tablet Commonly known as: PROTONIX Take 1 tablet by mouth once daily   Potassium 99 MG Tabs Take 99 mg by mouth daily.   roflumilast 500 MCG Tabs tablet Commonly known as: DALIRESP Take 1 tablet by mouth once daily   sodium chloride HYPERTONIC 3 % nebulizer solution Take by nebulization 2 (two) times daily.   Vitamin D-3 125 MCG (5000 UT) Tabs Take 5,000 Units by mouth daily.        Verbal and written Discharge instructions given to the patient. Wound care per Discharge AVS  Follow-up Information     Schnier, Latina Craver, MD Follow up in 2 week(s).   Specialties: Vascular Surgery, Cardiology, Radiology, Vascular Surgery Why: follow up after procedure with a carotid duplex Contact information: [address] [place] [address] [phone]                 Signed: Georgiana Spinner, NP  03/10/2023, 2:27 PM

## 2023-03-10 NOTE — Consult Note (Signed)
[facility] Customer service manager [facility]) Accountable Care Organization (ACO) [facility] Liaison Note  03/10/2023  Maria [name] [birth year] [id number]  Location: [facility] RN Hospital Liaison screened the patient remotely at [facility]  Insurance: [employer] Advantage   Maria [name] is a 67 y.o. female who is a Primary Care Patient of [facility], [facility]. The patient was screened for readmission hospitalization with noted low risk score for unplanned readmission risk with 1 IP in 6 months.  The patient was assessed for potential [facility]) Care Management service needs for post hospital transition for care coordination. Review of patient's electronic medical record reveals patient was  admitted for Stenosis left carotid. Pt discharging today from ICU with no added services at this time. No anticipated needs for care management services.  Plan: [facility] Liaison will continue to follow progress and disposition to asess for post hospital community care coordination/management needs.  Referral request for community care coordination: anticipate [facility] Transitions of Care Team follow up.   [facility] Care Management/Population Health does not replace or interfere with any arrangements made by the Inpatient Transition of Care team.   For questions contact:   Elliot Cousin, RN, [facility] Liaison [place]   Population Health Office Hours MTWF  8:00 am-6:00 pm Off on Thursday [phone] mobile [phone] [Office toll free line] Office Hours are M-F 8:30 - 5 pm [email or web address]

## 2023-03-10 NOTE — Progress Notes (Signed)
[place] [facility]  Daily Progress Note   Subjective  -   Patient feels well.  Still requiring low-dose of dopamine this morning.  Was off dopamine for much of the night.  No major overnight events  Objective Vitals:   03/10/23 0545 03/10/23 0600 03/10/23 0715 03/10/23 0730  BP: (!) 122/41 (!) 135/43 (!) 145/41 (!) 148/37  Pulse: (!) 48 (!) 49 (!) 52 (!) 52  Resp: 17 17 17 14   Temp:      TempSrc:      SpO2: 99% 98% 100% 100%  Weight:      Height:        Intake/Output Summary (Last 24 hours) at 03/10/2023 0832 Last data filed at 03/10/2023 0600 Gross per 24 hour  Intake 641.72 ml  Output 2650 ml  Net -2008.28 ml    PULM  CTAB CV  bradycardic VASC  Access site C/D/I. Neuro exam normal  Laboratory CBC    Component Value Date/Time   WBC 12.0 (H) 03/08/2023 0442   HGB 10.4 (L) 03/08/2023 0442   HGB 13.6 08/22/2019 1359   HCT 31.9 (L) 03/08/2023 0442   HCT 39.3 08/22/2019 1359   PLT 285 03/08/2023 0442   PLT 381 08/22/2019 1359    BMET    Component Value Date/Time   NA 138 03/08/2023 0442   K 3.3 (L) 03/08/2023 0442   CL 105 03/08/2023 0442   CO2 27 03/08/2023 0442   GLUCOSE 130 (H) 03/08/2023 0442   BUN 8 03/08/2023 0442   CREATININE 0.60 03/08/2023 0442   CALCIUM 8.5 (L) 03/08/2023 0442   GFRNONAA >60 03/08/2023 0442   GFRAA >60 03/03/2019 0154    Assessment/Planning: POD #2 s/p carotid angioplasty  Still requiring dopamine at 5 mcgs this morning. Will increase the midodrine dose Increase activity today If able to wean off of dopamine and be normotensive for several hours, may be able to discharge later today   Festus Barren  03/10/2023, 8:32 AM

## 2023-03-10 NOTE — Progress Notes (Signed)
Pt to be discharged to home. All discharge instructions discussed and all questions answered at this time.

## 2023-03-12 ENCOUNTER — Other Ambulatory Visit: Payer: Self-pay | Admitting: Nurse Practitioner

## 2023-03-12 ENCOUNTER — Other Ambulatory Visit: Payer: Self-pay | Admitting: Internal Medicine

## 2023-03-12 DIAGNOSIS — G479 Sleep disorder, unspecified: Secondary | ICD-10-CM

## 2023-03-13 ENCOUNTER — Encounter (INDEPENDENT_AMBULATORY_CARE_PROVIDER_SITE_OTHER): Payer: Self-pay | Admitting: Vascular Surgery

## 2023-03-14 MED ORDER — STUDY - ARNASA GB44332 - ASTEGOLIMAB 238 MG/1.7 ML OR PLACEBO SQ INJECTION (PI-RAMASWAMY)
476.0000 mg | INJECTION | SUBCUTANEOUS | Status: DC
  Filled 2023-03-14: qty 3.4

## 2023-03-14 NOTE — Telephone Encounter (Signed)
Patient had cpe 03/01/23 do not see next one set up  Last rf 02/10/23 #30 no rf

## 2023-03-15 ENCOUNTER — Encounter: Payer: Medicare HMO | Admitting: Internal Medicine

## 2023-03-15 ENCOUNTER — Encounter: Payer: Medicare HMO | Admitting: *Deleted

## 2023-03-15 DIAGNOSIS — J441 Chronic obstructive pulmonary disease with (acute) exacerbation: Secondary | ICD-10-CM

## 2023-03-15 DIAGNOSIS — Z006 Encounter for examination for normal comparison and control in clinical research program: Secondary | ICD-10-CM

## 2023-03-15 MED ORDER — STUDY - ARNASA GB44332 - ASTEGOLIMAB 238 MG/1.7 ML OR PLACEBO SQ INJECTION (PI-RAMASWAMY)
476.0000 mg | INJECTION | SUBCUTANEOUS | Status: DC
  Administered 2023-03-15: 476 mg via SUBCUTANEOUS
  Filled 2023-03-15: qty 3.4

## 2023-03-15 NOTE — Research (Cosign Needed)
Title: A Phase III, randomized, double-blind, placebo controlled, multicenter study to evaluate the efficacy and safety of astegolimab in patients with chronic obstructive pulmonary disease    Dose and Duration of Treatment: Astegolimab is presented as a sterile, slightly brown-yellow solution. Each single-use, 2.25 mL pre-filled syringe contains 1.7 mL deliverable volume. Astegolimab drug product is formulated at 140 mg/mL astegolimab with 114 mM succinic acid, 200 mM L-arginine, 10 mM L-methionine, 0.06% (w/v) polysorbate 20, pH 5.7. Placebo for astegolimab is supplied in an identical pre-filled syringe configuration.   Protocol # [id number]   Sponsor: [employer] [id number] [place] Protocol: Version 3, dated 26/May2023, Version 4 dated 20Jun2023 IB: version 9.0 dated April 2024 ICF: Main version 12Apr2023, revised 17July2023 Mobile Nursing 01Sep2023, revised 01Sep2023 Lab Manual: V4.0.0 20Oct2023     Mechanism of action: Astegolimab (also known as [id number] or [id number]) is a fully human, IgG2 monoclonal antibody that binds with high affinity to the interleukin (IL)-33 (IL-33) receptor, ST2, thereby blocking the signaling of IL-33, an inflammatory cytokine of the IL-1 family and member of the "alarmin" class of molecules. Astegolimab binds with high affinity to the human and cynomolgus monkey receptor for IL-33, ST2, and blocks IL-33 binding, thus inhibiting association with the IL-1R accessory protein (AcP) co-receptor and formation of an activated receptor complex.   K Integrated Pharmacokinetic/Pharmacodynamic Analysis:  In Studies [id number], [id number], and [id number], exploratory biomarker analysis showed there were consistent decreases in blood eosinophil counts throughout the treatment period, potentially mediated by a direct effect of IL-33 on eosinophil progenitors. In [place], there was no significant difference in fractional exhaled nitric oxide  (FeNO) levels (reflecting airway IL-4/IL-13 activity) between astegolimab-treated groups relative to placebo throughout the treatment period. These data suggest that astegolimab has only a limited effect on Type 2 inflammation in asthma. No data are applicable for Study [id number]. No data are available yet for Study [id number].  Special Warnings/Considerations: Administration of astegolimab, a protein therapeutic, may lead to the development of anti-astegolimab antibodies which could lead to AEs and/or decreased exposure. In non-clinical studies (see Section 4.2), ADA incidence has generally been low and there was no apparent ADA impact on PK and safety in these studies. To date, the immunogenicity rates observed with astegolimab in clinical studies have been relatively low as well (see Immunogenicity Section 5.6). Several clinical studies have been conducted in patients with asthma, atopic dermatitis, COPD (IIS Study [id number]) and COVID-19 severe pneumonia, which generally showed low incidence of ADAs (Table 31). There has been no correlation between ADA status and clinical findings or increased incidence of AEs. Astegolimab is now being considered in a larger study for the treatment of COPD. This patient population is typically considered to have a hyper-responsive immune system. Route of administration for this molecule will be [place], either Q2W or Q4W. These factors increase the risk of development of an immune response to astegolimab, specifically with repeat dosing. From the previous IIS Study [id number], incidence of ADAs was low in COPD patients; this remains to be confirmed in a larger study. To monitor ADA development in ongoing studies, serum samples will be collected from patients at protocol-defined intervals. Patients who test positive for antibodies and have clinical sequelae that are considered potentially related to an ADA response may also be asked to return for additional follow-up testing.  Drug  Interaction Studies: No PK drug interaction studies have been conducted to date.  Serious Adverse Reactions Observed in Asthma Study: During the treatment period, a similar  proportion of patients across all cohorts experienced at least one AE, regardless of causality (Table 22). In total, 77.2%, 70.9%, 72.2%, and 72.1% of patients reported at least 1 AE in the placebo, 70 mg, 210 mg, and 490 mg groups, respectively. The most common AE's (>5% in any treatment group) were asthma, nasopharyngitis, upper respiratory tract infection, headache, and injection site reaction (ISR). The most common drug-related AE was ISR, which was reported more frequently in the astegolimab treatment groups than in the placebo group (1 [0.8%] patient in the placebo group, 10 [7.9%] patients with 70 mg, 8 [6.3%] patients with 210 mg, 6 [4.9%] patients with 490 mg). All ISRs were non-serious and mild or moderate in severity. During the treatment period, 50 SAEs were reported in 37 (7.4%) patients. The number of patients reporting SAEs was comparable across all cohorts (11 SAEs in 8 patients on placebo, 21 SAEs in 14 patients on 70 mg, 11 SAEs in 9 patients on 210 mg, and 7 SAEs in 6 patients on 490 mg). The most common SAE was asthma. One SAE of moderate livedo reticularis (70 mg) was considered a suspected unexpected serious adverse drug reaction (SUSAR) related to astegolimab and was reported two days after the second dose, leading to discontinuation of astegolimab. Two (0.4%) patients reported anaphylaxis and hypersensitivity reactions: 1 severe SAE of anaphylactic reaction (placebo), and 1 moderate hypersensitivity (490 mg) considered related to astegolimab. Three (0.6%) patients experienced a potential Major Adverse Cardiac Event (MACE) (1 patient each in the placebo, 70 mg, and 210 mg groups). None of the potential MACE were considered related to astegolimab. Overall, 233 (46.4%) patients reported events of infection. A comparable  number of patients reported infection across all treatment groups (65 [51.2%] patients on placebo, 55 [43.3%] patients on 70 mg, 58 [46.0%] patients on 210 mg, and 55 [45.1%] patients on 490 mg). The most frequently reported infection (?10% incidence) was nasopharyngitis (12.7%). One patient on astegolimab 210 mg and the partner of one patient on placebo became pregnant during the study. Both delivered normal/healthy babies. Two deaths, unrelated to study drug, were reported: one patient on 210 mg astegolimab died following an SAE of asthma; the other patient on 490 mg astegolimab had an unexplained death. There were no clinically meaningful changes in laboratory parameters, vital signs, or ECG results, other than the 10% decrease in mean blood eosinophil counts in the astegolimab-treated groups, with no safety concerns. Treatment-induced ADAs were comparable between the astegolimab groups and had no impact on safety. Overall, astegolimab was well tolerated at all doses used and had a safety profile consistent with that observed in the previous astegolimab Phase I studies.  Serious Adverse Reactions Observed in Previous COPD Study: In the completed IIS Study [id number], a total of 81 patients received at least one dose of astegolimab or placebo. The safety profile of astegolimab was similar to that of placebo. There were a total of 222 AEs reported in 62 patients. A total of 28 (72%) patients in the placebo arm and 34 (81%) patients in the astegolimab arm reported at least one AE. The most commonly reported AEs were headache followed by urinary tract infection and viral upper respiratory tract infection. A total of 39 SAEs were reported in 28 (35%) patients; 16 (41%) patients in the placebo group and 12 (29%) in the astegolimab group. The most commonly reported SAE was hospital admission for community acquired pneumonia. Four SAEs resulted in patient discontinuation from treatment (1 patient in the placebo group  and 3  patients in the astegolimab group). One patient in the placebo group experienced an AESI of potential MACE (heart failure, unrelated to the study treatment). No anaphylaxis or pregnancies were reported. Two deaths, unrelated to study treatment, were reported: 1 patient in the placebo group died after hospital acquired pneumonia, and another patient in the placebo group died after pneumonia and type 2 respiratory failure.  Safety Data: Astegolimab has been generally well tolerated. There have been 49 patient deaths across all astegolimab studies (Section 5.5.2), none of which were considered related to astegolimab. A total of 144 subjects experienced a total of 224 SAEs across all astegolimab studies. Of these, an SAE livedo reticularis observed in 1 patient was considered related to astegolimab by the investigator (Section 5.5.3). AEs leading to withdrawal (Section 5.5.4) have generally occurred at a rate similar to what would be expected for clinical trials in the studies' respective indications.   [facility] Clinical Research Coordinator note :    This visit for Maria [name] with DOB: 02/19/1926 on  for the above protocol is Visit 14 week 24  and is for purpose of research.    The consent for this encounter is under Protocol Version 2.0 , Investigator Brochure Version 7, Consent Version IRB Approved  revised  and is currently IRB approved.    Maria expressed continued interest and consent in continuing as a study Maria. Maria confirmed that there was no change in contact information (e.g. address, telephone, email). Maria thanked for participation in research and contribution to science.  In this visit week 24 the Maria will be evaluated by Carmin Muskrat Ramaswamy,MD This research coordinator has verified that the above investigator is up to date with his/her training logs.    The Maria was informed that the PI Dr. Marchelle Gearing continues to have oversight of the Maria's visits  and course  through relevant discussions, reviews and also specifically of this visit by routing of this note to the PI. [facility] Clinical Research Coordinator note:   This visit for Maria Maria [name] with DOB: 02/03/1956 on 03/15/2023 for the above protocol is Visit 14  week 24 and is for purpose of research.   Maria  expressed continued interest and consent in continuing as a study Maria. Maria confirmed that there was  no  change in contact information (e.g. address, telephone, email). Maria thanked for participation in research and contribution to science. In this visit 03/15/2023 the Maria will be evaluated by [facility] Principal Investigator . This research coordinator has verified that the above investigator is yes up to date with his/her training logs.   The Maria was yes informed that the PI Murali Ramaswamy,MD continues to have oversight of the Maria's visits and course through relevant discussions, reviews, and also specifically of this visit by routing of this note to the PI.  All procedures and assessments were performed per above stated protocol. Maria tolerated injections well. Please refer to subjects paper source binder for further details of this visit.    Signed by  Laurelyn Sickle  Clinical Research Coordinator / Nurse Judie Bonus, [place] 1:47 PM 03/15/2023

## 2023-03-15 NOTE — Progress Notes (Signed)
Title: A Phase III, randomized, double-blind, placebo controlled, multicenter study to evaluate the efficacy and safety of astegolimab in patients with chronic obstructive pulmonary disease    Dose and Duration of Treatment: Astegolimab is presented as a sterile, slightly brown-yellow solution. Each single-use, 2.25 mL pre-filled syringe contains 1.7 mL deliverable volume. Astegolimab drug product is formulated at 140 mg/mL astegolimab with 114 mM succinic acid, 200 mM L-arginine, 10 mM L-methionine, 0.06% (w/v) polysorbate 20, pH 5.7. Placebo for astegolimab is supplied in an identical pre-filled syringe configuration.   Protocol # [id number]   Sponsor: [employer] [id number], [place]   Protocol: Version 3, dated 26/May2023, Version 4 dated 20Jun2023 IB: version 9.0 dated April 2024 ICF: Main version 12Apr2023, revised 17July2023 Mobile Nursing 01Sep2023, revised 01Sep2023 Lab Manual: V4.0.0 20Oct2023  Xxxxxxxxxxxxxxxxxx   This visit for Subject Maria [name] with DOB: [birth year] on 03/15/2023 for the above protocol is Visit/Encounter # week 24  and is for purpose of researchg . Subject/LAR expressed continued interest and consent in continuing as a study subject. Subject thanked for participation in research and contribution to science.    S: Presents for week 24 visit.  From a COPD perspective she states she is doing well and stable.  No flareups.  Review of the records indicate on 02/23/2023 she saw vascular surgeon and reported transient visual changes in the left eye several times despite taking baby aspirin.  He felt she was symptomatic with respect to the carotid stenosis and there was progression with lesion greater than 70%.  She did see primary care physician on 03/01/2023 Fayrene Fearing cable and was felt to be in baseline.  Then on 03/06/2023 she saw pain management physician Dr. Oletta Cohn as follow-up from status post cervical epidural steroid injection on  02/06/2023.  On this visit she reported 85% improvement  Then on 03/07/2023 she underwent angioplasty of the left internal carotid artery.  A stent was attempted but was unsuccessful.  Her postoperative stay was extended more than anticipated because of hypotension requiring dopamine vasopressors.  Ultimately because of ongoing hypotension she was placed on midodrine and discharged on 03/10/2023.  Her stay was extended by extra 2 days   Object Neuro intact Left carotid bruit Oxygen on Barrel chest without any wheezing no distress Overall looks well  Assessment/plan  -  COPD with recurrent exacerbation and on oxygen therapy  -No current exacerbation Research patient -continue per protocol with injections.  No interim exacerbation  Adverse events related concerns and issues 1) serious adverse event -03/07/2023 through 03/10/2023 for postop shock and then chronic hypotension due to unanticipated prolongation of hospitalization  -This was recognized by the principal investigator only today 03/15/2023  -SAe report to be filed within 24 hours -discussed with CRC  2) cervical radiculopathy  -> I think this medical issue.  The fact she got treatment with epidural injection on 02/06/2023.  Will discuss with the coordinator whether this would be an AE for ongoing medical problems  3) carotid stenosis  -S/P status post angioplasty of the left internal carotid artery 03/07/2023 we will mark it as treatment     SIGNATURE    Dr. Kalman Shan, M.D., F.C.C.P, ACRP-CPI Pulmonary and Critical Care Medicine Research Investigator, [facility] Staff Physician, [facility] Director - Interstitial Lung Disease  Program  P[facility] [place] [facility] and [facility] [address] [place], [place], [address]   Pager: [phone], If  no answer  OR between  19:00-7:00h: page [phone]  [phone] Telephone (research): [phone]  11:36  AM 03/15/2023   11:36 AM 03/15/2023

## 2023-03-16 ENCOUNTER — Ambulatory Visit
Admission: RE | Admit: 2023-03-16 | Discharge: 2023-03-16 | Disposition: A | Discharge status: Home / Self Care | Payer: Medicare HMO | Source: Ambulatory Visit | Attending: Nurse Practitioner | Admitting: Nurse Practitioner

## 2023-03-16 DIAGNOSIS — Z1231 Encounter for screening mammogram for malignant neoplasm of breast: Secondary | ICD-10-CM | POA: Insufficient documentation

## 2023-03-16 NOTE — Patient Instructions (Signed)
ICD-10-CM   1. Research exam  Z00.6     2. COPD, frequent exacerbations (HCC)  J44.1       Per protocol 

## 2023-03-20 ENCOUNTER — Encounter (INDEPENDENT_AMBULATORY_CARE_PROVIDER_SITE_OTHER): Payer: Self-pay | Admitting: Vascular Surgery

## 2023-03-21 ENCOUNTER — Other Ambulatory Visit: Payer: Self-pay | Admitting: Nurse Practitioner

## 2023-03-21 DIAGNOSIS — R928 Other abnormal and inconclusive findings on diagnostic imaging of breast: Secondary | ICD-10-CM

## 2023-03-21 DIAGNOSIS — R921 Mammographic calcification found on diagnostic imaging of breast: Secondary | ICD-10-CM

## 2023-03-23 ENCOUNTER — Encounter: Payer: Self-pay | Admitting: Student in an Organized Health Care Education/Training Program

## 2023-03-23 ENCOUNTER — Ambulatory Visit
Payer: Medicare HMO | Attending: Student in an Organized Health Care Education/Training Program | Admitting: Student in an Organized Health Care Education/Training Program

## 2023-03-23 VITALS — BP 112/43 | HR 61 | Temp 97.2°F | Ht 65.0 in | Wt 135.0 lb

## 2023-03-23 DIAGNOSIS — M25511 Pain in right shoulder: Secondary | ICD-10-CM | POA: Diagnosis not present

## 2023-03-23 DIAGNOSIS — M5010 Cervical disc disorder with radiculopathy, unspecified cervical region: Secondary | ICD-10-CM | POA: Insufficient documentation

## 2023-03-23 DIAGNOSIS — G5681 Other specified mononeuropathies of right upper limb: Secondary | ICD-10-CM | POA: Insufficient documentation

## 2023-03-23 DIAGNOSIS — Q761 Klippel-Feil syndrome: Secondary | ICD-10-CM | POA: Insufficient documentation

## 2023-03-23 DIAGNOSIS — G8929 Other chronic pain: Secondary | ICD-10-CM | POA: Diagnosis not present

## 2023-03-23 NOTE — Progress Notes (Signed)
PROVIDER NOTE: Information contained herein reflects review and annotations entered in association with encounter. Interpretation of such information and data should be left to medically-trained personnel. Information provided to patient can be located elsewhere in the medical record under "Patient Instructions". Document created using STT-dictation technology, any transcriptional errors that may result from process are unintentional.    Patient: Maria [name]  Service Category: E/M  Provider: Edward Jolly, MD  DOB: [birth year]  DOS: 03/23/2023  Referring Provider: Eden Emms, NP  MRN: [id number]  Specialty: Interventional Pain Management  PCP: Eden Emms, NP  Type: Established Patient  Setting: Ambulatory outpatient    Location: Office  Delivery: Face-to-face     HPI  Maria [name], a 67 y.o. year old female, is here today because of her Chronic right shoulder pain [M25.511, G89.29]. Maria [name] primary complain today is Neck Pain  Pertinent problems: Maria [name] has Anxiety; Numbness and tingling in both hands; History of cervical spinal surgery; Radicular pain in right arm; Cervical fusion syndrome (C6-7); Cervical disc disorder with radiculopathy of cervical region (right); Requires continuous at home supplemental oxygen (3L)  COPD; and Chronic pain syndrome on their pertinent problem list. Pain Assessment: Severity of Chronic pain is reported as a 7 /10. Location: Neck  /radiates down to right shoulder. Onset: More than a month ago. Quality: Burning, Sharp, Shooting. Timing: Constant. Modifying factor(s): laying down. Vitals:  height is 5\' 5"  (1.651 m) and weight is 135 lb (61.2 kg). Her temperature is 97.2 F (36.2 C) (abnormal). Her blood pressure is 112/43 (abnormal) and her pulse is 61. Her oxygen saturation is 100%.  BMI: Estimated body mass index is 22.47 kg/m as calculated from the following:   Height as of this encounter: 5\' 5"  (1.651 m).   Weight as of this encounter: 135 lb (61.2  kg). Last encounter: 03/06/2023. Last procedure: 02/06/2023.  Reason for encounter: patient-requested evaluation.   Increased right shoulder pain and pain that radiates into her neck as well.   ROS  Constitutional: Denies any fever or chills Gastrointestinal: No reported hemesis, hematochezia, vomiting, or acute GI distress Musculoskeletal:  Right shoulder pain and pain radiation into right neck Neurological: No reported episodes of acute onset apraxia, aphasia, dysarthria, agnosia, amnesia, paralysis, loss of coordination, or loss of consciousness  Medication Review  Azelastine HCl, Budeson-Glycopyrrol-Formoterol, Flutter, Oxygen-Helium, Potassium, Vitamin D-3, albuterol, aspirin EC, atorvastatin, benzonatate, clopidogrel, escitalopram, gabapentin, hydrOXYzine, ipratropium-albuterol, midodrine, oxybutynin, pantoprazole, roflumilast, and sodium chloride HYPERTONIC  History Review  Allergy: Maria [name] is allergic to montelukast. Drug: Maria [name]  reports no history of drug use. Alcohol:  reports no history of alcohol use. Tobacco:  reports that she quit smoking about 7 years ago. Her smoking use included cigarettes. She started smoking about 52 years ago. She has a 45 pack-year smoking history. She has been exposed to tobacco smoke. She has never used smokeless tobacco. Social: Maria [name]  reports that she quit smoking about 7 years ago. Her smoking use included cigarettes. She started smoking about 52 years ago. She has a 45 pack-year smoking history. She has been exposed to tobacco smoke. She has never used smokeless tobacco. She reports that she does not drink alcohol and does not use drugs. Medical:  has a past medical history of Atherosclerosis (1/0612014), Blood transfusion without reported diagnosis (2005), Cancer (HCC) (Ovarian 1978), Chest pain, COPD (chronic obstructive pulmonary disease) (HCC), COVID-19 long hauler manifesting chronic dyspnea, Emphysema of lung (HCC) (2019), Oxygen  deficiency (03/2019), and Oxygen  dependent. Surgical: Maria [name]  has a past surgical history that includes Abdominal hysterectomy (1978); Ovarian cyst removal (1988); Neck surgery (1999); Spine surgery (Neck 2002); Cholecystectomy (2010); Colonoscopy; Colonoscopy; Esophagogastroduodenoscopy; Colonoscopy with propofol (N/A, 11/01/2021); and CAROTID PTA/STENT INTERVENTION (Left, 03/07/2023). Family: family history includes Arthritis in her mother; Asthma in her son; Birth defects in her son; Breast cancer (age of onset: 60) in her maternal aunt; COPD in her brother, brother, and father; Diabetes in her mother and son; Hearing loss in her father; Heart disease in her mother; Heart failure in her mother; Hypertension in her mother; Kidney disease in her mother; Lung cancer in her brother; Obesity in her mother.  Laboratory Chemistry Profile   Renal Lab Results  Component Value Date   BUN 8 03/08/2023   CREATININE 0.60 03/08/2023   GFR 92.70 03/01/2023   GFRAA >60 03/03/2019   GFRNONAA >60 03/08/2023    Hepatic Lab Results  Component Value Date   AST 14 03/01/2023   ALT 9 03/01/2023   ALBUMIN 4.0 03/01/2023   ALKPHOS 66 03/01/2023   LIPASE 19 11/09/2016    Electrolytes Lab Results  Component Value Date   NA 138 03/08/2023   K 3.3 (L) 03/08/2023   CL 105 03/08/2023   CALCIUM 8.5 (L) 03/08/2023   MG 2.0 03/19/2019   PHOS 3.4 03/19/2019    Bone Lab Results  Component Value Date   VD25OH 35.22 06/03/2019    Inflammation (CRP: Acute Phase) (ESR: Chronic Phase) Lab Results  Component Value Date   CRP 1.3 (H) 03/03/2019   ESRSEDRATE 41 (H) 07/03/2019         Note: Above Lab results reviewed.  Recent Imaging Review  MM 3D SCREENING MAMMOGRAM BILATERAL BREAST CLINICAL DATA:  Screening.  EXAM: DIGITAL SCREENING BILATERAL MAMMOGRAM WITH TOMOSYNTHESIS AND CAD  TECHNIQUE: Bilateral screening digital craniocaudal and mediolateral oblique mammograms were obtained. Bilateral screening  digital breast tomosynthesis was performed. The images were evaluated with computer-aided detection.  COMPARISON:  Previous exam(s).  ACR Breast Density Category c: The breasts are heterogeneously dense, which may obscure small masses.  FINDINGS: In the left breast, calcifications warrant further evaluation. In the right breast, no findings suspicious for malignancy.  IMPRESSION: Further evaluation is suggested for calcifications in the left breast.  RECOMMENDATION: Diagnostic mammogram of the left breast. (Code:[id number])  The patient will be contacted regarding the findings, and additional imaging will be scheduled.  BI-RADS CATEGORY  0: Incomplete: Need additional imaging evaluation.  Electronically Signed   By: Hulan Saas M.D.   On: 03/17/2023 13:27 Note: Reviewed        Physical Exam  General appearance: Well nourished, well developed, and well hydrated. In no apparent acute distress Mental status: Alert, oriented x 3 (person, place, & time)       Respiratory: No evidence of acute respiratory distress Eyes: PERLA Vitals: BP (!) 112/43   Pulse 61   Temp (!) 97.2 F (36.2 C)   Ht 5\' 5"  (1.651 m)   Wt 135 lb (61.2 kg)   SpO2 100% Comment: on 4L/[place]  BMI 22.47 kg/m  BMI: Estimated body mass index is 22.47 kg/m as calculated from the following:   Height as of this encounter: 5\' 5"  (1.651 m).   Weight as of this encounter: 135 lb (61.2 kg). Ideal: Ideal body weight: 57 kg (125 lb 10.6 oz) Adjusted ideal body weight: 58.7 kg (129 lb 6.4 oz)  Cervical Spine Area Exam  Skin & Axial Inspection: Well healed scar  from previous spine surgery detected Alignment: Symmetrical Functional ROM: Pain restricted ROM      Stability: No instability detected Muscle Tone/Strength: Functionally intact. No obvious neuro-muscular anomalies detected. Sensory (Neurological): Neurogenic pain pattern Palpation: No palpable anomalies             Upper Extremity (UE) Exam    Side:  Right upper extremity  Side: Left upper extremity  Skin & Extremity Inspection: Skin color, temperature, and hair growth are WNL. No peripheral edema or cyanosis. No masses, redness, swelling, asymmetry, or associated skin lesions. No contractures.  Skin & Extremity Inspection: Skin color, temperature, and hair growth are WNL. No peripheral edema or cyanosis. No masses, redness, swelling, asymmetry, or associated skin lesions. No contractures.  Functional ROM: Pain restricted ROM for shoulder  Functional ROM: Unrestricted ROM          Muscle Tone/Strength: Functionally intact. No obvious neuro-muscular anomalies detected.  Muscle Tone/Strength: Functionally intact. No obvious neuro-muscular anomalies detected.  Sensory (Neurological): Neurogenic pain pattern          Sensory (Neurological): Unimpaired          Palpation: No palpable anomalies              Palpation: No palpable anomalies              Provocative Test(s):  Phalen's test: deferred Tinel's test: deferred Apley's scratch test (touch opposite shoulder):  Action 1 (Across chest): Decreased ROM Action 2 (Overhead): Decreased ROM Action 3 (LB reach): Decreased ROM   Provocative Test(s):  Phalen's test: deferred Tinel's test: deferred Apley's scratch test (touch opposite shoulder):  Action 1 (Across chest): deferred Action 2 (Overhead): deferred Action 3 (LB reach): deferred     Assessment   Diagnosis Status  1. Chronic right shoulder pain   2. Disorder of right suprascapular nerve   3. Cervical fusion syndrome (C6-7)   4. Cervical disc disorder with radiculopathy of cervical region (right)    Controlled Controlled Controlled     Plan of Care  Right shoulder pain, worse with shoulder abduction.  She also has radiating right cervical spine pain to her right shoulder.  We discussed a right suprascapular nerve block.  If helpful options include suprascapular nerve radiofrequency ablation versus stim.  We also discussed  cervical spinal cord stimulation as well   Orders Placed This Encounter  Procedures   SUPRASCAPULAR NERVE BLOCK    For shoulder pain.    Standing Status:   Future    Standing Expiration Date:   06/22/2023    Scheduling Instructions:     Purpose: RIGHT     Level(s): Suprascapular notch     Sedation: local     Scheduling Timeframe: As permitted by the schedule    Order Specific Question:   Where will this procedure be performed?    Answer:   [facility]   Follow-up plan:   Return in about 13 days (around 04/05/2023) for right SSNB, in clinic NS (ok to continue Plavix).      Future interventional options: -Cervical facet medial branch nerve blocks -Glenohumeral joint injection -Suprascapular nerve block -Cervical SCS trial  Also consider trial of Lyrica     Recent Visits Date Type Provider Dept  03/06/23 Office Visit Edward Jolly, MD [facility]  02/06/23 Procedure visit Edward Jolly, MD [facility]  01/11/23 Office Visit Edward Jolly, MD [facility]  Showing recent visits within past 90 days and meeting all other requirements Today's Visits  Date Type Provider Dept  03/23/23 Office Visit Edward Jolly, MD [facility]  Showing today's visits and meeting all other requirements Future Appointments No visits were found meeting these conditions. Showing future appointments within next 90 days and meeting all other requirements  I discussed the assessment and treatment plan with the patient. The patient was provided an opportunity to ask questions and all were answered. The patient agreed with the plan and demonstrated an understanding of the instructions.  Patient advised to call back or seek an in-person evaluation if the symptoms or condition worsens.  Duration of encounter: .  Total time on encounter, as per AMA guidelines included both the face-to-face and non-face-to-face time personally spent by the physician  and/or other qualified health care professional(s) on the day of the encounter (includes time in activities that require the physician or other qualified health care professional and does not include time in activities normally performed by clinical staff). Physician's time may include the following activities when performed: Preparing to see the patient (e.g., pre-charting review of records, searching for previously ordered imaging, lab work, and nerve conduction tests) Review of prior analgesic pharmacotherapies. Reviewing PMP Interpreting ordered tests (e.g., lab work, imaging, nerve conduction tests) Performing post-procedure evaluations, including interpretation of diagnostic procedures Obtaining and/or reviewing separately obtained history Performing a medically appropriate examination and/or evaluation Counseling and educating the patient/family/caregiver Ordering medications, tests, or procedures Referring and communicating with other health care professionals (when not separately reported) Documenting clinical information in the electronic or other health record Independently interpreting results (not separately reported) and communicating results to the patient/ family/caregiver Care coordination (not separately reported)  Note by: Edward Jolly, MD Date: 03/23/2023; Time: 11:16 AM

## 2023-03-23 NOTE — Progress Notes (Signed)
Safety precautions to be maintained throughout the outpatient stay will include: orient to surroundings, keep bed in low position, maintain call bell within reach at all times, provide assistance with transfer out of bed and ambulation.  

## 2023-03-24 ENCOUNTER — Other Ambulatory Visit (INDEPENDENT_AMBULATORY_CARE_PROVIDER_SITE_OTHER): Payer: Self-pay | Admitting: Vascular Surgery

## 2023-03-24 DIAGNOSIS — I6523 Occlusion and stenosis of bilateral carotid arteries: Secondary | ICD-10-CM

## 2023-03-25 DIAGNOSIS — J449 Chronic obstructive pulmonary disease, unspecified: Secondary | ICD-10-CM | POA: Diagnosis not present

## 2023-03-25 DIAGNOSIS — J9611 Chronic respiratory failure with hypoxia: Secondary | ICD-10-CM | POA: Diagnosis not present

## 2023-03-27 ENCOUNTER — Ambulatory Visit (INDEPENDENT_AMBULATORY_CARE_PROVIDER_SITE_OTHER): Payer: Medicare HMO | Admitting: Vascular Surgery

## 2023-03-27 ENCOUNTER — Encounter (INDEPENDENT_AMBULATORY_CARE_PROVIDER_SITE_OTHER): Payer: Self-pay | Admitting: Vascular Surgery

## 2023-03-27 ENCOUNTER — Ambulatory Visit (INDEPENDENT_AMBULATORY_CARE_PROVIDER_SITE_OTHER): Payer: Medicare HMO

## 2023-03-27 ENCOUNTER — Other Ambulatory Visit (INDEPENDENT_AMBULATORY_CARE_PROVIDER_SITE_OTHER): Payer: Self-pay | Admitting: Obstetrics and Gynecology

## 2023-03-27 VITALS — BP 154/71 | HR 71 | Resp 18 | Ht 65.5 in | Wt 135.0 lb

## 2023-03-27 DIAGNOSIS — I6522 Occlusion and stenosis of left carotid artery: Secondary | ICD-10-CM

## 2023-03-27 DIAGNOSIS — I6523 Occlusion and stenosis of bilateral carotid arteries: Secondary | ICD-10-CM

## 2023-03-27 NOTE — Progress Notes (Signed)
Patient ID: Maria [name], female   DOB: 04-Aug-1955, 67 y.o.   MRN: [id number]  Chief Complaint  Patient presents with   Follow-up    follow up after procedure with a carotid duplex 14:42 call left message office would not pick up phoneleft message. 03/10/2023    HPI [name] is a 67 y.o. female.    No new neurological symptoms.  Blood pressure has been stable and the heart rate has been back to baseline.  No groin related complaints  Duplex ultrasound of the carotid arteries obtained today demonstrates RICA 40-59% and LICA 60-79%   Past Medical History:  Diagnosis Date   Atherosclerosis 1/0612014   aorta, iliacs and CFA bilaterally no greater than 0-49% - lower arterial doppler.   Blood transfusion without reported diagnosis 2005   Cancer [facility] Ovarian 1978   Chest pain    COPD (chronic obstructive pulmonary disease) (HCC)    COVID-19 long hauler manifesting chronic dyspnea    Emphysema of lung (HCC) 2019   Oxygen deficiency 03/2019   Oxygen dependent    3 liters up to 5    Past Surgical History:  Procedure Laterality Date   ABDOMINAL HYSTERECTOMY  1978   CAROTID PTA/STENT INTERVENTION Left 03/07/2023   Procedure: CAROTID PTA/STENT INTERVENTION;  Surgeon: Renford Dills, MD;  Location: [facility];  Service: Cardiovascular;  Laterality: Left;   CHOLECYSTECTOMY  2010   COLONOSCOPY     COLONOSCOPY     COLONOSCOPY WITH PROPOFOL N/A 11/01/2021   Procedure: COLONOSCOPY WITH PROPOFOL;  Surgeon: Wyline Mood, MD;  Location: [facility] END[facility];  Service: Gastroenterology;  Laterality: N/A;   ESOPHAGOGASTRODUODENOSCOPY     NECK SURGERY  1999   OVARIAN CYST REMOVAL  1988   SPINE SURGERY  Neck 2002      Allergies  Allergen Reactions   Montelukast     Bad dreams    Current Outpatient Medications  Medication Sig Dispense Refill   albuterol (VENTOLIN HFA) 108 (90 Base) MCG/ACT inhaler TAKE 2 PUFFS BY MOUTH EVERY 6 HOURS AS NEEDED FOR WHEEZE OR SHORTNESS OF  BREATH 8.5 each 5   ASPIRIN LOW DOSE 81 MG EC tablet TAKE 1 TABLET BY MOUTH EVERY DAY 90 tablet 3   atorvastatin (LIPITOR) 10 MG tablet Take 1 tablet (10 mg total) by mouth daily. 90 tablet 2   Azelastine HCl 137 MCG/SPRAY SOLN USE 1 SPRAY INTO EACH NOSTRIL TWICE A DAY 30 mL 1   benzonatate (TESSALON) 200 MG capsule Take 1 capsule by mouth three times daily as needed 90 capsule 6   Budeson-Glycopyrrol-Formoterol (BREZTRI AEROSPHERE) 160-9-4.8 MCG/ACT AERO Inhale 2 puffs into the lungs in the morning and at bedtime. 10.7 g 11   Cholecalciferol (VITAMIN D-3) 125 MCG (5000 UT) TABS Take 5,000 Units by mouth daily.     clopidogrel (PLAVIX) 75 MG tablet Take 1 tablet (75 mg total) by mouth daily. 30 tablet 5   escitalopram (LEXAPRO) 20 MG tablet Take 1 tablet (20 mg total) by mouth daily. 90 tablet 1   gabapentin (NEURONTIN) 600 MG tablet Take 1 tablet (600 mg total) by mouth every 8 (eight) hours. 90 tablet 5   hydrOXYzine (VISTARIL) 25 MG capsule TAKE 1 CAPSULE BY MOUTH AT BEDTIME AS NEEDED 30 capsule 0   ipratropium-albuterol (DUONEB) 0.5-2.5 (3) MG/3ML SOLN USE 1 AMPULE IN NEBULIZER 4 TIMES A DAY AS NEEDED (DX: J84.112, J44.9) 360 mL 5   midodrine (PROAMATINE) 10 MG tablet Take 1 tablet (10  mg total) by mouth 2 (two) times daily with a meal. 60 tablet 2   oxybutynin (DITROPAN-XL) 10 MG 24 hr tablet Take 1 tablet (10 mg total) by mouth daily. 30 tablet 11   OXYGEN Inhale into the lungs. 3 liters at rest and 5 liters with any activity.-     pantoprazole (PROTONIX) 40 MG tablet Take 1 tablet by mouth once daily 30 tablet 3   Potassium 99 MG TABS Take 99 mg by mouth daily.     Respiratory Therapy Supplies (FLUTTER) DEVI Use as directed 1 each 0   roflumilast (DALIRESP) 500 MCG TABS tablet Take 1 tablet by mouth once daily 30 tablet 0   sodium chloride HYPERTONIC 3 % nebulizer solution Take by nebulization 2 (two) times daily. 750 mL 12   Current Facility-Administered Medications  Medication Dose  Route Frequency Provider Last Rate Last Valerie Salts - ARNASA [id number] - astegolimab 238 mg/1.7 mL or placebo SQ injection (PI-Ramaswamy)  476 mg Subcutaneous Q14 Days    476 mg at 03/15/23 1123        Physical Exam BP (!) 154/71 (BP Location: Left Arm)   Pulse 71   Resp 18   Ht 5' 5.5" (1.664 m)   Wt 135 lb (61.2 kg)   BMI 22.12 kg/m  Gen:  WD/WN, NAD Skin: incision C/D/I Neuro: Neuro is intact    Assessment/Plan: 1. Carotid stenosis, symptomatic w/o infarct, left The patient has done well postop.  She is status post angioplasty without stenting of the left internal carotid artery but with significant improvement.  She no longer has a critical lesion.  She will continue Eliquis Plavix and aspirin for 3 months at which time my plan will be to DC the Plavix and continue her Eliquis and aspirin.     Levora Dredge 03/27/2023, 4:36 PM   This note was created with Dragon medical transcription system.  Any errors from dictation are unintentional.

## 2023-03-28 ENCOUNTER — Other Ambulatory Visit: Payer: Self-pay | Admitting: Nurse Practitioner

## 2023-03-28 ENCOUNTER — Ambulatory Visit
Admission: RE | Admit: 2023-03-28 | Discharge: 2023-03-28 | Disposition: A | Discharge status: Home / Self Care | Payer: Medicare HMO | Source: Ambulatory Visit | Attending: Nurse Practitioner | Admitting: Nurse Practitioner

## 2023-03-28 DIAGNOSIS — R921 Mammographic calcification found on diagnostic imaging of breast: Secondary | ICD-10-CM | POA: Insufficient documentation

## 2023-03-28 DIAGNOSIS — R928 Other abnormal and inconclusive findings on diagnostic imaging of breast: Secondary | ICD-10-CM | POA: Diagnosis not present

## 2023-03-28 DIAGNOSIS — R92333 Mammographic heterogeneous density, bilateral breasts: Secondary | ICD-10-CM | POA: Diagnosis not present

## 2023-03-29 ENCOUNTER — Encounter: Payer: Medicare HMO | Admitting: *Deleted

## 2023-03-29 DIAGNOSIS — Z006 Encounter for examination for normal comparison and control in clinical research program: Secondary | ICD-10-CM

## 2023-03-29 DIAGNOSIS — J441 Chronic obstructive pulmonary disease with (acute) exacerbation: Secondary | ICD-10-CM

## 2023-03-29 MED ORDER — STUDY - ARNASA GB44332 - ASTEGOLIMAB 238 MG/1.7 ML OR PLACEBO SQ INJECTION (PI-RAMASWAMY)
476.0000 mg | INJECTION | SUBCUTANEOUS | Status: DC
  Administered 2023-03-29: 476 mg via SUBCUTANEOUS
  Filled 2023-03-29: qty 3.4

## 2023-03-29 NOTE — Research (Cosign Needed)
Title: A Phase III, randomized, double-blind, placebo controlled, multicenter study to evaluate the efficacy and safety of astegolimab in patients with chronic obstructive pulmonary disease    Dose and Duration of Treatment: Astegolimab is presented as a sterile, slightly brown-yellow solution. Each single-use, 2.25 mL pre-filled syringe contains 1.7 mL deliverable volume. Astegolimab drug product is formulated at 140 mg/mL astegolimab with 114 mM succinic acid, 200 mM L-arginine, 10 mM L-methionine, 0.06% (w/v) polysorbate 20, pH 5.7. Placebo for astegolimab is supplied in an identical pre-filled syringe configuration.   Protocol # [id number]   Sponsor: [employer] [address] [place] Protocol: Version 3, dated 26/May2023, Version 4 dated 20Jun2023 IB: version 9.0 dated April 2024 ICF: Main version 12Apr2023, revised 17July2023 Mobile Nursing 01Sep2023, revised 01Sep2023 Lab Manual: V4.0.0 20Oct2023   Mechanism of action: Astegolimab (also known as [id number] or [id number]) is a fully human, IgG2 monoclonal antibody that binds with high affinity to the interleukin (IL)-33 (IL-33) receptor, ST2, thereby blocking the signaling of IL-33, an inflammatory cytokine of the IL-1 family and member of the "alarmin" class of molecules. Astegolimab binds with high affinity to the human and cynomolgus monkey receptor for IL-33, ST2, and blocks IL-33 binding, thus inhibiting association with the IL-1R accessory protein (AcP) co-receptor and formation of an activated receptor complex.    Integrated Pharmacokinetic/Pharmacodynamic Analysis:  In Studies [id number], [id number], and [id number], exploratory biomarker analysis showed there were consistent decreases in blood eosinophil counts throughout the treatment period, potentially mediated by a direct effect of IL-33 on eosinophil progenitors. In [place], there was no significant difference in fractional exhaled nitric oxide (FeNO)  levels (reflecting airway IL-4/IL-13 activity) between astegolimab-treated groups relative to placebo throughout the treatment period. These data suggest that astegolimab has only a limited effect on Type 2 inflammation in asthma. No data are applicable for Study [id number]. No data are available yet for Study [id number].  Special Warnings/Considerations: Administration of astegolimab, a protein therapeutic, may lead to the development of anti-astegolimab antibodies which could lead to AEs and/or decreased exposure. In non-clinical studies (see Section 4.2), ADA incidence has generally been low and there was no apparent ADA impact on PK and safety in these studies. To date, the immunogenicity rates observed with astegolimab in clinical studies have been relatively low as well (see Immunogenicity Section 5.6). Several clinical studies have been conducted in patients with asthma, atopic dermatitis, COPD (IIS Study [id number]) and COVID-19 severe pneumonia, which generally showed low incidence of ADAs (Table 31). There has been no correlation between ADA status and clinical findings or increased incidence of AEs. Astegolimab is now being considered in a larger study for the treatment of COPD. This patient population is typically considered to have a hyper-responsive immune system. Route of administration for this molecule will be , either Q2W or Q4W. These factors increase the risk of development of an immune response to astegolimab, specifically with repeat dosing. From the previous IIS Study [id number], incidence of ADAs was low in COPD patients; this remains to be confirmed in a larger study. To monitor ADA development in ongoing studies, serum samples will be collected from patients at protocol-defined intervals. Patients who test positive for antibodies and have clinical sequelae that are considered potentially related to an ADA response may also be asked to return for additional follow-up testing.  Drug Interaction  Studies: No PK drug interaction studies have been conducted to date.  Serious Adverse Reactions Observed in Asthma Study: During the treatment period, a similar proportion of  patients across all cohorts experienced at least one AE, regardless of causality (Table 22). In total, 77.2%, 70.9%, 72.2%, and 72.1% of patients reported at least 1 AE in the placebo, 70 mg, 210 mg, and 490 mg groups, respectively. The most common AE's (>5% in any treatment group) were asthma, nasopharyngitis, upper respiratory tract infection, headache, and injection site reaction (ISR). The most common drug-related AE was ISR, which was reported more frequently in the astegolimab treatment groups than in the placebo group (1 [0.8%] patient in the placebo group, 10 [7.9%] patients with 70 mg, 8 [6.3%] patients with 210 mg, 6 [4.9%] patients with 490 mg). All ISRs were non-serious and mild or moderate in severity. During the treatment period, 50 SAEs were reported in 37 (7.4%) patients. The number of patients reporting SAEs was comparable across all cohorts (11 SAEs in 8 patients on placebo, 21 SAEs in 14 patients on 70 mg, 11 SAEs in 9 patients on 210 mg, and 7 SAEs in 6 patients on 490 mg). The most common SAE was asthma. One SAE of moderate livedo reticularis (70 mg) was considered a suspected unexpected serious adverse drug reaction (SUSAR) related to astegolimab and was reported two days after the second dose, leading to discontinuation of astegolimab. Two (0.4%) patients reported anaphylaxis and hypersensitivity reactions: 1 severe SAE of anaphylactic reaction (placebo), and 1 moderate hypersensitivity (490 mg) considered related to astegolimab. Three (0.6%) patients experienced a potential Major Adverse Cardiac Event (MACE) (1 patient each in the placebo, 70 mg, and 210 mg groups). None of the potential MACE were considered related to astegolimab. Overall, 233 (46.4%) patients reported events of infection. A comparable number of  patients reported infection across all treatment groups (65 [51.2%] patients on placebo, 55 [43.3%] patients on 70 mg, 58 [46.0%] patients on 210 mg, and 55 [45.1%] patients on 490 mg). The most frequently reported infection (>=10% incidence) was nasopharyngitis (12.7%). One patient on astegolimab 210 mg and the partner of one patient on placebo became pregnant during the study. Both delivered normal/healthy babies. Two deaths, unrelated to study drug, were reported: one patient on 210 mg astegolimab died following an SAE of asthma; the other patient on 490 mg astegolimab had an unexplained death. There were no clinically meaningful changes in laboratory parameters, vital signs, or ECG results, other than the 10% decrease in mean blood eosinophil counts in the astegolimab-treated groups, with no safety concerns. Treatment-induced ADAs were comparable between the astegolimab groups and had no impact on safety. Overall, astegolimab was well tolerated at all doses used and had a safety profile consistent with that observed in the previous astegolimab Phase I studies.  Serious Adverse Reactions Observed in Previous COPD Study: In the completed IIS Study [id number], a total of 81 patients received at least one dose of astegolimab or placebo. The safety profile of astegolimab was similar to that of placebo. There were a total of 222 AEs reported in 62 patients. A total of 28 (72%) patients in the placebo arm and 34 (81%) patients in the astegolimab arm reported at least one AE. The most commonly reported AEs were headache followed by urinary tract infection and viral upper respiratory tract infection. A total of 39 SAEs were reported in 28 (35%) patients; 16 (41%) patients in the placebo group and 12 (29%) in the astegolimab group. The most commonly reported SAE was hospital admission for community acquired pneumonia. Four SAEs resulted in patient discontinuation from treatment (1 patient in the placebo group and 3  patients in  the astegolimab group). One patient in the placebo group experienced an AESI of potential MACE (heart failure, unrelated to the study treatment). No anaphylaxis or pregnancies were reported. Two deaths, unrelated to study treatment, were reported: 1 patient in the placebo group died after hospital acquired pneumonia, and another patient in the placebo group died after pneumonia and type 2 respiratory failure.  Safety Data: Astegolimab has been generally well tolerated. There have been 49 patient deaths across all astegolimab studies (Section 5.5.2), none of which were considered related to astegolimab. A total of 144 subjects experienced a total of 224 SAEs across all astegolimab studies. Of these, an SAE livedo reticularis observed in 1 patient was considered related to astegolimab by the investigator (Section 5.5.3). AEs leading to withdrawal (Section 5.5.4) have generally occurred at a rate similar to what would be expected for clinical trials in the studies' respective indications.   [facility] Clinical Research Coordinator note :    This visit for Subject [id number] with DOB: October 13, 1955 on 27 Mar 2023 for the above protocol is Visit 15/Week 26  and is for purpose of research.    The consent for this encounter is under Protocol Version 2.0 , Investigator Brochure Version 7, Consent Version IRB Approved  revised  and is currently IRB approved.    Subject expressed continued interest and consent in continuing as a study subject. Subject confirmed that there was no change in contact information (e.g. address, telephone, email). Subject thanked for participation in research and contribution to science.   The Subject was informed that the PI Dr. Marchelle Gearing continues to have oversight of the subject's visits and course  through relevant discussions, reviews and also specifically of this visit by routing of this note to the PI.  [facility] Clinical Research Coordinator note:    This visit for Subject Maria [name] with DOB: [birth year] on 03/29/2023 for the above protocol is Visit/Encounter # 15/Week 26  and is for purpose of research.   Subject expressed continued interest and consent in continuing as a study subject. Subject confirmed that there was  no   change in contact information (e.g. address, telephone, email). Subject thanked for participation in research and contribution to science. The Subject was yes informed that the PI Murali Ramaswamy,MD continues to have oversight of the subject's visits and course through relevant discussions, reviews, and also specifically of this visit by routing of this note to the PI. All assessments and Procedures were completed per above stated protocol. Subject tolerated injections well. Please refer to subjects paper source binder for further details of this visit.  Signed by  Jerolyn Shin, [facility]  Clinical Research Coordinator  [facility]  [place], [place] 11:36 AM 03/29/2023

## 2023-03-30 DIAGNOSIS — N3941 Urge incontinence: Secondary | ICD-10-CM | POA: Diagnosis not present

## 2023-03-30 DIAGNOSIS — E785 Hyperlipidemia, unspecified: Secondary | ICD-10-CM | POA: Diagnosis not present

## 2023-03-30 DIAGNOSIS — J439 Emphysema, unspecified: Secondary | ICD-10-CM | POA: Diagnosis not present

## 2023-03-30 DIAGNOSIS — I951 Orthostatic hypotension: Secondary | ICD-10-CM | POA: Diagnosis not present

## 2023-03-30 DIAGNOSIS — F419 Anxiety disorder, unspecified: Secondary | ICD-10-CM | POA: Diagnosis not present

## 2023-03-30 DIAGNOSIS — M1990 Unspecified osteoarthritis, unspecified site: Secondary | ICD-10-CM | POA: Diagnosis not present

## 2023-03-30 DIAGNOSIS — I2510 Atherosclerotic heart disease of native coronary artery without angina pectoris: Secondary | ICD-10-CM | POA: Diagnosis not present

## 2023-03-30 DIAGNOSIS — F324 Major depressive disorder, single episode, in partial remission: Secondary | ICD-10-CM | POA: Diagnosis not present

## 2023-03-30 DIAGNOSIS — K219 Gastro-esophageal reflux disease without esophagitis: Secondary | ICD-10-CM | POA: Diagnosis not present

## 2023-03-30 DIAGNOSIS — G629 Polyneuropathy, unspecified: Secondary | ICD-10-CM | POA: Diagnosis not present

## 2023-04-01 DIAGNOSIS — G894 Chronic pain syndrome: Secondary | ICD-10-CM | POA: Diagnosis not present

## 2023-04-01 DIAGNOSIS — M25511 Pain in right shoulder: Secondary | ICD-10-CM | POA: Diagnosis not present

## 2023-04-01 DIAGNOSIS — S92352S Displaced fracture of fifth metatarsal bone, left foot, sequela: Secondary | ICD-10-CM | POA: Diagnosis not present

## 2023-04-01 DIAGNOSIS — Q761 Klippel-Feil syndrome: Secondary | ICD-10-CM | POA: Diagnosis not present

## 2023-04-01 DIAGNOSIS — M5010 Cervical disc disorder with radiculopathy, unspecified cervical region: Secondary | ICD-10-CM | POA: Diagnosis not present

## 2023-04-04 ENCOUNTER — Ambulatory Visit
Admission: RE | Admit: 2023-04-04 | Discharge: 2023-04-04 | Disposition: A | Discharge status: Home / Self Care | Payer: Medicare HMO | Source: Ambulatory Visit | Attending: Nurse Practitioner | Admitting: Nurse Practitioner

## 2023-04-04 DIAGNOSIS — R920 Mammographic microcalcification found on diagnostic imaging of breast: Secondary | ICD-10-CM | POA: Diagnosis not present

## 2023-04-04 DIAGNOSIS — R921 Mammographic calcification found on diagnostic imaging of breast: Secondary | ICD-10-CM

## 2023-04-04 DIAGNOSIS — R928 Other abnormal and inconclusive findings on diagnostic imaging of breast: Secondary | ICD-10-CM | POA: Insufficient documentation

## 2023-04-04 HISTORY — PX: BREAST BIOPSY: SHX20

## 2023-04-04 MED ORDER — LIDOCAINE-EPINEPHRINE 1 %-1:100000 IJ SOLN
20.0000 mL | Freq: Once | INTRAMUSCULAR | Status: AC
  Administered 2023-04-04: 20 mL
  Filled 2023-04-04: qty 20

## 2023-04-04 MED ORDER — LIDOCAINE 1 % OPTIME INJ - NO CHARGE
5.0000 mL | Freq: Once | INTRAMUSCULAR | Status: AC
  Administered 2023-04-04: 5 mL
  Filled 2023-04-04: qty 6

## 2023-04-05 ENCOUNTER — Ambulatory Visit (INDEPENDENT_AMBULATORY_CARE_PROVIDER_SITE_OTHER): Payer: Medicare HMO | Admitting: Internal Medicine

## 2023-04-05 ENCOUNTER — Encounter: Payer: Self-pay | Admitting: Internal Medicine

## 2023-04-05 VITALS — BP 100/66 | HR 77 | Temp 97.0°F | Ht 65.5 in | Wt 134.0 lb

## 2023-04-05 DIAGNOSIS — D692 Other nonthrombocytopenic purpura: Secondary | ICD-10-CM | POA: Diagnosis not present

## 2023-04-05 DIAGNOSIS — R051 Acute cough: Secondary | ICD-10-CM | POA: Diagnosis not present

## 2023-04-05 DIAGNOSIS — J069 Acute upper respiratory infection, unspecified: Secondary | ICD-10-CM | POA: Insufficient documentation

## 2023-04-05 LAB — COMPREHENSIVE METABOLIC PANEL WITH GFR
ALT: 10 U/L (ref 0–35)
AST: 16 U/L (ref 0–37)
Albumin: 3.7 g/dL (ref 3.5–5.2)
Alkaline Phosphatase: 81 U/L (ref 39–117)
BUN: 16 mg/dL (ref 6–23)
CO2: 28 meq/L (ref 19–32)
Calcium: 8.9 mg/dL (ref 8.4–10.5)
Chloride: 100 meq/L (ref 96–112)
Creatinine, Ser: 0.87 mg/dL (ref 0.40–1.20)
GFR: 69.57 mL/min (ref >60.00)
Glucose, Bld: 116 mg/dL — ABNORMAL HIGH (ref 70–99)
Potassium: 3.7 meq/L (ref 3.5–5.1)
Sodium: 136 meq/L (ref 135–145)
Total Bilirubin: 0.8 mg/dL (ref 0.2–1.2)
Total Protein: 6.2 g/dL (ref 6.0–8.3)

## 2023-04-05 LAB — CBC
HCT: 34.7 % — ABNORMAL LOW (ref 36.0–46.0)
Hemoglobin: 11.2 g/dL — ABNORMAL LOW (ref 12.0–15.0)
MCHC: 32.4 g/dL (ref 30.0–36.0)
MCV: 96.1 fl (ref 78.0–100.0)
Platelets: 270 10*3/uL (ref 150.0–400.0)
RBC: 3.61 Mil/uL — ABNORMAL LOW (ref 3.87–5.11)
RDW: 13.6 % (ref 11.5–15.5)
WBC: 15.7 10*3/uL — ABNORMAL HIGH (ref 4.0–10.5)

## 2023-04-05 LAB — C-REACTIVE PROTEIN: CRP: 15.8 mg/dL (ref 0.5–20.0)

## 2023-04-05 LAB — SEDIMENTATION RATE: Sed Rate: 37 mm/h — ABNORMAL HIGH (ref 0–30)

## 2023-04-05 LAB — POC COVID19 BINAXNOW: SARS Coronavirus 2 Ag: NEGATIVE

## 2023-04-05 NOTE — Progress Notes (Signed)
Subjective:    Patient ID: Maria [name], female    DOB: [birth year], 67 y.o.   MRN: [id number]  HPI Here with husband due to respiratory infection  Had biopsy of left breast yesterday Last night--started with scratchy throat and headache Now with rash on legs---started on the way to the office today Gets cold easy in general--felt cold yesterday Slightly chills but no fever or sweats Some cough--but chronic. Worse last night Some SOB--also increased Frontal headache and around to the back  Bad pain in left foot this morning--couldn't bear weight on it No trauma or fall now Wearing walking shoe that she had  Tried aleve this morning  Current Outpatient Medications on File Prior to Visit  Medication Sig Dispense Refill   albuterol (VENTOLIN HFA) 108 (90 Base) MCG/ACT inhaler TAKE 2 PUFFS BY MOUTH EVERY 6 HOURS AS NEEDED FOR WHEEZE OR SHORTNESS OF BREATH 8.5 each 5   ASPIRIN LOW DOSE 81 MG EC tablet TAKE 1 TABLET BY MOUTH EVERY DAY 90 tablet 3   atorvastatin (LIPITOR) 10 MG tablet Take 1 tablet (10 mg total) by mouth daily. 90 tablet 2   Azelastine HCl 137 MCG/SPRAY SOLN USE 1 SPRAY INTO EACH NOSTRIL TWICE A DAY 30 mL 1   benzonatate (TESSALON) 200 MG capsule Take 1 capsule by mouth three times daily as needed 90 capsule 6   Budeson-Glycopyrrol-Formoterol (BREZTRI AEROSPHERE) 160-9-4.8 MCG/ACT AERO Inhale 2 puffs into the lungs in the morning and at bedtime. 10.7 g 11   Cholecalciferol (VITAMIN D-3) 125 MCG (5000 UT) TABS Take 5,000 Units by mouth daily.     clopidogrel (PLAVIX) 75 MG tablet Take 1 tablet (75 mg total) by mouth daily. 30 tablet 5   escitalopram (LEXAPRO) 20 MG tablet Take 1 tablet (20 mg total) by mouth daily. 90 tablet 1   gabapentin (NEURONTIN) 600 MG tablet Take 1 tablet (600 mg total) by mouth every 8 (eight) hours. 90 tablet 5   hydrOXYzine (VISTARIL) 25 MG capsule TAKE 1 CAPSULE BY MOUTH AT BEDTIME AS NEEDED 30 capsule 0   ipratropium-albuterol (DUONEB)  0.5-2.5 (3) MG/3ML SOLN USE 1 AMPULE IN NEBULIZER 4 TIMES A DAY AS NEEDED (DX: J84.112, J44.9) 360 mL 5   midodrine (PROAMATINE) 10 MG tablet Take 1 tablet (10 mg total) by mouth 2 (two) times daily with a meal. 60 tablet 2   oxybutynin (DITROPAN-XL) 10 MG 24 hr tablet Take 1 tablet (10 mg total) by mouth daily. 30 tablet 11   OXYGEN Inhale into the lungs. 3 liters at rest and 5 liters with any activity.-     pantoprazole (PROTONIX) 40 MG tablet Take 1 tablet by mouth once daily 30 tablet 3   Potassium 99 MG TABS Take 99 mg by mouth daily.     Respiratory Therapy Supplies (FLUTTER) DEVI Use as directed 1 each 0   roflumilast (DALIRESP) 500 MCG TABS tablet Take 1 tablet by mouth once daily 30 tablet 0   sodium chloride HYPERTONIC 3 % nebulizer solution Take by nebulization 2 (two) times daily. 750 mL 12   Current Facility-Administered Medications on File Prior to Visit  Medication Dose Route Frequency Provider Last Rate Last Valerie Salts - ARNASA [id number] - astegolimab 238 mg/1.7 mL or placebo SQ injection (PI-Ramaswamy)  476 mg Subcutaneous Q14 Days    476 mg at 03/29/23 0940    Allergies  Allergen Reactions   Montelukast     Bad dreams    Past Medical History:  Diagnosis Date   Atherosclerosis 1/0612014   aorta, iliacs and CFA bilaterally no greater than 0-49% - lower arterial doppler.   Blood transfusion without reported diagnosis 2005   Cancer [facility]) Ovarian 1978   Chest pain    COPD (chronic obstructive pulmonary disease) (HCC)    COVID-19 long hauler manifesting chronic dyspnea    Emphysema of lung (HCC) 2019   Oxygen deficiency 03/2019   Oxygen dependent    3 liters up to 5    Past Surgical History:  Procedure Laterality Date   ABDOMINAL HYSTERECTOMY  1978   BREAST BIOPSY Left 04/04/2023   Stereo bx, X clip, path pending   BREAST BIOPSY Left 04/04/2023   MM LT BREAST BX W LOC DEV 1ST LESION IMAGE BX SPEC STEREO GUIDE 04/04/2023 [facility]   CAROTID PTA/STENT  INTERVENTION Left 03/07/2023   Procedure: CAROTID PTA/STENT INTERVENTION;  Surgeon: Renford Dills, MD;  Location: [facility] INVASIVE CV LAB;  Service: Cardiovascular;  Laterality: Left;   CHOLECYSTECTOMY  2010   COLONOSCOPY     COLONOSCOPY     COLONOSCOPY WITH PROPOFOL N/A 11/01/2021   Procedure: COLONOSCOPY WITH PROPOFOL;  Surgeon: Wyline Mood, MD;  [facility] ENDOSCOPY;  Service: Gastroenterology;  Laterality: N/A;   ESOPHAGOGASTRODUODENOSCOPY     NECK SURGERY  1999   OVARIAN CYST REMOVAL  1988   SPINE SURGERY  Neck 2002    Family History  Problem Relation Age of Onset   Heart failure Mother    Diabetes Mother    Kidney disease Mother    Hypertension Mother    Arthritis Mother    Heart disease Mother    Obesity Mother    COPD Father    Hearing loss Father    Lung cancer Brother    COPD Brother    COPD Brother    Asthma Son    Diabetes Son    Birth defects Son    Breast cancer Maternal Aunt 40    Social History   Socioeconomic History   Marital status: Married    Spouse name: Marcial Pacas Manufacturing systems engineer)   Number of children: 3   Years of education: Some college   Highest education level: Some college, no degree  Occupational History   Not on file  Tobacco Use   Smoking status: Former    Current packs/day: 0.00    Average packs/day: 1 pack/day for 45.0 years (45.0 ttl pk-yrs)    Types: Cigarettes    Start date: 03/19/1971    Quit date: 03/18/2016    Years since quitting: 7.0    Passive exposure: Past   Smokeless tobacco: Never  Vaping Use   Vaping status: Never Used  Substance and Sexual Activity   Alcohol use: No    Alcohol/week: 0.0 standard drinks of alcohol   Drug use: No   Sexual activity: Not Currently  Other Topics Concern   Not on file  Social History Narrative   08/27/19   From: the area (but [place] area)   Living: with husband [name] - since 1987   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      Family: Daughter Recruitment consultant nearby, son Thereasa Distance  in [place], and other daughter - 1 hour; 5 grandchildren      Enjoys: crochet, relax at home      Exercise: on hold but has been doing pulmonary rehab   Diet: not currently, but has lost weight      Safety   Seat belts: Yes  Guns: Yes  and secure   Safe in relationships: Yes    Social Determinants of Health   Financial Resource Strain: Low Risk  (02/27/2023)   Overall Financial Resource Strain (CARDIA)    Difficulty of Paying Living Expenses: Not hard at all  Food Insecurity: No Food Insecurity (03/07/2023)   Hunger Vital Sign    Worried About Running Out of Food in the Last Year: Never true    Ran Out of Food in the Last Year: Never true  Transportation Needs: No Transportation Needs (03/07/2023)   PRAPARE - Administrator, Civil Service (Medical): No    Lack of Transportation (Non-Medical): No  Physical Activity: Inactive (02/27/2023)   Exercise Vital Sign    Days of Exercise per Week: 0 days    Minutes of Exercise per Session: 0 min  Stress: No Stress Concern Present (02/27/2023)   [employer] of Occupational Health - Occupational Stress Questionnaire    Feeling of Stress : Not at all  Social Connections: Socially Integrated (02/27/2023)   Social Connection and Isolation Panel [NHANES]    Frequency of Communication with Friends and Family: More than three times a week    Frequency of Social Gatherings with Friends and Family: More than three times a week    Attends Religious Services: More than 4 times per year    Active Member of [employer] or Organizations: Yes    Attends Engineer, structural: More than 4 times per year    Marital Status: Married  Catering manager Violence: Not At Risk (03/07/2023)   Humiliation, Afraid, Rape, and Kick questionnaire    Fear of Current or Ex-Partner: No    Emotionally Abused: No    Physically Abused: No    Sexually Abused: No   Review of Systems Some nausea this morning--no vomiting Did have slight breakfast No  loss of taste--smell is bad since last COVID    Objective:   Physical Exam Constitutional:      General: She is not in acute distress. Cardiovascular:     Rate and Rhythm: Normal rate and regular rhythm.     Heart sounds: No murmur heard.    No gallop.  Pulmonary:     Effort: Pulmonary effort is normal.     Breath sounds: Normal breath sounds. No wheezing or rales.  Abdominal:     Palpations: Abdomen is soft.     Tenderness: There is no abdominal tenderness.  Musculoskeletal:     Cervical back: Neck supple.  Lymphadenopathy:     Cervical: No cervical adenopathy.  Skin:    Comments: Purpuric lesions both anterior calves Raised ring like purpuric lesions on dorsum of left foot--tender  Neurological:     Mental Status: She is alert.            Assessment & Plan:

## 2023-04-05 NOTE — Assessment & Plan Note (Signed)
Just started today Palpable and tender--just on foot Too soon to have systemic issues Will check labs---sed rate, CRP, ANA, ANCA, complement, CBC, Met c Referral if ongoing symptoms suggest vasculitis

## 2023-04-05 NOTE — Assessment & Plan Note (Signed)
Early symptoms  COVID negative She can recheck this in 1-2 days--would consider paxlovid if positive Supportive care

## 2023-04-06 ENCOUNTER — Telehealth: Payer: Self-pay | Admitting: Nurse Practitioner

## 2023-04-06 ENCOUNTER — Telehealth: Payer: Self-pay | Admitting: *Deleted

## 2023-04-06 ENCOUNTER — Encounter: Payer: Self-pay | Admitting: Internal Medicine

## 2023-04-06 NOTE — Telephone Encounter (Signed)
Per appt notes pt already has appt scheduled with Fulton Mole FNP on 04/07/23 at 8:30 am. Sending note to [facility] webb FNP and Dr Alphonsus Sias.

## 2023-04-06 NOTE — Telephone Encounter (Signed)
FYI: This call has been transferred to Access Nurse. Once the result note has been entered staff can address the message at that time.  Patient called in with the following symptoms:  Red Word: Swelling of L foot, redness and painful. Saw Dr. Alphonsus Sias  yesterday and was told to call back if swelling worsened    Please advise at Mobile [phone] (mobile)  Message is routed to Provider Pool and [facility] Triage

## 2023-04-06 NOTE — Telephone Encounter (Signed)
PT reached out via [facility] saying she got a notice she is late for her LCS. Please call PT. TY.

## 2023-04-06 NOTE — Telephone Encounter (Signed)
Pt called back stating she was told by access nurse to schedule an appt within 24hrs. No available appts for today, 9/19. Scheduled pt with Worthy Rancher, NP on tomorrow, 9/20 @ 8:30am. Routing message to [employer]. Call back # [phone]

## 2023-04-07 ENCOUNTER — Other Ambulatory Visit: Payer: Self-pay | Admitting: *Deleted

## 2023-04-07 ENCOUNTER — Ambulatory Visit (INDEPENDENT_AMBULATORY_CARE_PROVIDER_SITE_OTHER): Payer: Medicare HMO | Admitting: Family

## 2023-04-07 ENCOUNTER — Encounter: Payer: Self-pay | Admitting: Family

## 2023-04-07 VITALS — BP 122/64 | HR 72 | Temp 97.3°F | Ht 65.5 in | Wt 134.0 lb

## 2023-04-07 DIAGNOSIS — Z122 Encounter for screening for malignant neoplasm of respiratory organs: Secondary | ICD-10-CM

## 2023-04-07 DIAGNOSIS — L03116 Cellulitis of left lower limb: Secondary | ICD-10-CM | POA: Diagnosis not present

## 2023-04-07 DIAGNOSIS — Z87891 Personal history of nicotine dependence: Secondary | ICD-10-CM

## 2023-04-07 DIAGNOSIS — M7989 Other specified soft tissue disorders: Secondary | ICD-10-CM | POA: Diagnosis not present

## 2023-04-07 LAB — ANCA PROFILE
Anti-MPO Antibodies: <0.2 units (ref 0.0–0.9)
Anti-PR3 Antibodies: <0.2 units (ref 0.0–0.9)
Atypical pANCA: <1:20 {titer}
C-ANCA: <1:20 {titer}
P-ANCA: <1:20 {titer}

## 2023-04-07 LAB — COMPLEMENT, TOTAL: Compl, Total (CH50): <13 U/mL — ABNORMAL LOW (ref 31–60)

## 2023-04-07 LAB — ENA+DNA/DS+SJORGEN'S
ENA RNP Ab: 2.2 AI — ABNORMAL HIGH (ref 0.0–0.9)
ENA SM Ab Ser-aCnc: <0.2 AI (ref 0.0–0.9)
ENA SSA (RO) Ab: 0.3 AI (ref 0.0–0.9)
ENA SSB (LA) Ab: <0.2 AI (ref 0.0–0.9)
dsDNA Ab: 2 IU/mL (ref 0–9)

## 2023-04-07 LAB — ANA W/REFLEX: Anti Nuclear Antibody (ANA): POSITIVE — AB

## 2023-04-07 MED ORDER — CEPHALEXIN 500 MG PO CAPS: 500.0000 mg | ORAL_CAPSULE | Freq: Three times a day (TID) | ORAL | 0 refills | Status: DC

## 2023-04-07 NOTE — Telephone Encounter (Signed)
Spoke with patient Scheduled SDMV 04/14/23 CT scheduled 04/18/23 Pt voiced understanding and had no further questions.

## 2023-04-07 NOTE — Progress Notes (Signed)
Acute Office Visit  Subjective:     Patient ID: Maria [name], female    DOB: 1956-01-14, 67 y.o.   MRN: [id number]  Chief Complaint  Patient presents with   Foot Swelling    Left foot swelling. Is unable to put pressure on foot.  Rash started on Tuesday. Rash has spread up both legs.    HPI 67 year old white female patient with a history of multiple comorbidities, patient of Matt cable is in today with concerns of bilateral feet swelling with redness to her left foot and lower leg x 3 days and worsening.  She has had increased pain and swelling in her left foot.  Unable to apply pressure.  Reports that it started out as a red rash that is spread up both legs.   Review of Systems  Constitutional:        Feeling feverish  Cardiovascular:  Positive for leg swelling.       Swelling in both lower extremities but worse on the left  Gastrointestinal: Negative.   Musculoskeletal: Negative.   Skin:  Positive for rash.       Rednes to both lower legs  Neurological: Negative.   Psychiatric/Behavioral: Negative.    All other systems reviewed and are negative.  Past Medical History:  Diagnosis Date   Atherosclerosis 1/0612014   aorta, iliacs and CFA bilaterally no greater than 0-49% - lower arterial doppler.   Blood transfusion without reported diagnosis 2005   Cancer [facility]) Ovarian 1978   Chest pain    COPD (chronic obstructive pulmonary disease) (HCC)    COVID-19 long hauler manifesting chronic dyspnea    Emphysema of lung (HCC) 2019   Oxygen deficiency 03/2019   Oxygen dependent    3 liters up to 5    Social History   Socioeconomic History   Marital status: Married    Spouse name: Marcial Pacas Manufacturing systems engineer)   Number of children: 3   Years of education: Some college   Highest education level: Some college, no degree  Occupational History   Not on file  Tobacco Use   Smoking status: Former    Current packs/day: 0.00    Average packs/day: 1 pack/day for 45.0 years (45.0 ttl  pk-yrs)    Types: Cigarettes    Start date: 03/19/1971    Quit date: 03/18/2016    Years since quitting: 7.0    Passive exposure: Past   Smokeless tobacco: Never  Vaping Use   Vaping status: Never Used  Substance and Sexual Activity   Alcohol use: No    Alcohol/week: 0.0 standard drinks of alcohol   Drug use: No   Sexual activity: Not Currently  Other Topics Concern   Not on file  Social History Narrative   08/27/19   From: the area (but [place] area)   Living: with husband [name] - since 1987   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      Family: Daughter Recruitment consultant nearby, son Thereasa Distance in [place], and other daughter - 1 hour; 5 grandchildren      Enjoys: crochet, relax at home      Exercise: on hold but has been doing pulmonary rehab   Diet: not currently, but has lost weight      Safety   Seat belts: Yes    Guns: Yes  and secure   Safe in relationships: Yes    Social Determinants of Health   Financial Resource Strain: Low Risk  (02/27/2023)   Overall Financial  Resource Strain (CARDIA)    Difficulty of Paying Living Expenses: Not hard at all  Food Insecurity: No Food Insecurity (03/07/2023)   Hunger Vital Sign    Worried About Running Out of Food in the Last Year: Never true    Ran Out of Food in the Last Year: Never true  Transportation Needs: No Transportation Needs (03/07/2023)   PRAPARE - Administrator, Civil Service (Medical): No    Lack of Transportation (Non-Medical): No  Physical Activity: Inactive (02/27/2023)   Exercise Vital Sign    Days of Exercise per Week: 0 days    Minutes of Exercise per Session: 0 min  Stress: No Stress Concern Present (02/27/2023)   [employer] of Occupational Health - Occupational Stress Questionnaire    Feeling of Stress : Not at all  Social Connections: Socially Integrated (02/27/2023)   Social Connection and Isolation Panel [NHANES]    Frequency of Communication with Friends and Family: More than three  times a week    Frequency of Social Gatherings with Friends and Family: More than three times a week    Attends Religious Services: More than 4 times per year    Active Member of Clubs or Organizations: Yes    Attends Banker Meetings: More than 4 times per year    Marital Status: Married  Catering manager Violence: Not At Risk (03/07/2023)   Humiliation, Afraid, Rape, and Kick questionnaire    Fear of Current or Ex-Partner: No    Emotionally Abused: No    Physically Abused: No    Sexually Abused: No    Past Surgical History:  Procedure Laterality Date   ABDOMINAL HYSTERECTOMY  1978   BREAST BIOPSY Left 04/04/2023   Stereo bx, X clip, path pending   BREAST BIOPSY Left 04/04/2023   MM LT BREAST BX W LOC DEV 1ST LESION IMAGE BX SPEC STEREO GUIDE 04/04/2023 [facility]   CAROTID PTA/STENT INTERVENTION Left 03/07/2023   Procedure: CAROTID PTA/STENT INTERVENTION;  Surgeon: Renford Dills, MD;  Location: [facility] INVASIVE CV LAB;  Service: Cardiovascular;  Laterality: Left;   CHOLECYSTECTOMY  2010   COLONOSCOPY     COLONOSCOPY     COLONOSCOPY WITH PROPOFOL N/A 11/01/2021   Procedure: COLONOSCOPY WITH PROPOFOL;  Surgeon: Wyline Mood, MD;  Location: [facility] ENDOSCOPY;  Service: Gastroenterology;  Laterality: N/A;   ESOPHAGOGASTRODUODENOSCOPY     NECK SURGERY  1999   OVARIAN CYST REMOVAL  1988   SPINE SURGERY  Neck 2002    Family History  Problem Relation Age of Onset   Heart failure Mother    Diabetes Mother    Kidney disease Mother    Hypertension Mother    Arthritis Mother    Heart disease Mother    Obesity Mother    COPD Father    Hearing loss Father    Lung cancer Brother    COPD Brother    COPD Brother    Asthma Son    Diabetes Son    Birth defects Son    Breast cancer Maternal Aunt 40    Allergies  Allergen Reactions   Montelukast     Bad dreams    Current Outpatient Medications on File Prior to Visit  Medication Sig Dispense Refill   albuterol  (VENTOLIN HFA) 108 (90 Base) MCG/ACT inhaler TAKE 2 PUFFS BY MOUTH EVERY 6 HOURS AS NEEDED FOR WHEEZE OR SHORTNESS OF BREATH 8.5 each 5   ASPIRIN LOW DOSE 81 MG EC tablet TAKE 1 TABLET  BY MOUTH EVERY DAY 90 tablet 3   atorvastatin (LIPITOR) 10 MG tablet Take 1 tablet (10 mg total) by mouth daily. 90 tablet 2   Azelastine HCl 137 MCG/SPRAY SOLN USE 1 SPRAY INTO EACH NOSTRIL TWICE A DAY 30 mL 1   benzonatate (TESSALON) 200 MG capsule Take 1 capsule by mouth three times daily as needed 90 capsule 6   Budeson-Glycopyrrol-Formoterol (BREZTRI AEROSPHERE) 160-9-4.8 MCG/ACT AERO Inhale 2 puffs into the lungs in the morning and at bedtime. 10.7 g 11   Cholecalciferol (VITAMIN D-3) 125 MCG (5000 UT) TABS Take 5,000 Units by mouth daily.     clopidogrel (PLAVIX) 75 MG tablet Take 1 tablet (75 mg total) by mouth daily. 30 tablet 5   escitalopram (LEXAPRO) 20 MG tablet Take 1 tablet (20 mg total) by mouth daily. 90 tablet 1   gabapentin (NEURONTIN) 600 MG tablet Take 1 tablet (600 mg total) by mouth every 8 (eight) hours. 90 tablet 5   hydrOXYzine (VISTARIL) 25 MG capsule TAKE 1 CAPSULE BY MOUTH AT BEDTIME AS NEEDED 30 capsule 0   ipratropium-albuterol (DUONEB) 0.5-2.5 (3) MG/3ML SOLN USE 1 AMPULE IN NEBULIZER 4 TIMES A DAY AS NEEDED (DX: J84.112, J44.9) 360 mL 5   midodrine (PROAMATINE) 10 MG tablet Take 1 tablet (10 mg total) by mouth 2 (two) times daily with a meal. 60 tablet 2   oxybutynin (DITROPAN-XL) 10 MG 24 hr tablet Take 1 tablet (10 mg total) by mouth daily. 30 tablet 11   OXYGEN Inhale into the lungs. 3 liters at rest and 5 liters with any activity.-     pantoprazole (PROTONIX) 40 MG tablet Take 1 tablet by mouth once daily 30 tablet 3   Potassium 99 MG TABS Take 99 mg by mouth daily.     Respiratory Therapy Supplies (FLUTTER) DEVI Use as directed 1 each 0   roflumilast (DALIRESP) 500 MCG TABS tablet Take 1 tablet by mouth once daily 30 tablet 0   sodium chloride HYPERTONIC 3 % nebulizer solution  Take by nebulization 2 (two) times daily. 750 mL 12   Current Facility-Administered Medications on File Prior to Visit  Medication Dose Route Frequency Provider Last Rate Last Valerie Salts - ARNASA [id number] - astegolimab 238 mg/1.7 mL or placebo SQ injection (PI-Ramaswamy)  476 mg Subcutaneous Q14 Days    476 mg at 03/29/23 0940    BP 122/64   Pulse 72   Temp (!) 97.3 F (36.3 C) (Temporal)   Ht 5' 5.5" (1.664 m)   Wt 134 lb (60.8 kg)   SpO2 100% Comment: on 4 L of O2  BMI 21.96 kg/m chart      Objective:    BP 122/64   Pulse 72   Temp (!) 97.3 F (36.3 C) (Temporal)   Ht 5' 5.5" (1.664 m)   Wt 134 lb (60.8 kg)   SpO2 100% Comment: on 4 L of O2  BMI 21.96 kg/m    Physical Exam Vitals and nursing note reviewed.  Constitutional:      Appearance: Normal appearance.  Cardiovascular:     Rate and Rhythm: Normal rate.  Pulmonary:     Effort: Pulmonary effort is normal.     Breath sounds: Normal breath sounds.     Comments: On oxygen but stable Musculoskeletal:        General: Swelling present.     Right lower leg: Edema present.     Left lower leg: Edema present.     Comments:  Left lower extremity: Redness, tenderness, and swelling noted to the foot.  Negative Homans' sign.  Right lower extremity mild swelling mild redness negative Homans' sign  Skin:    General: Skin is warm and dry.  Neurological:     General: No focal deficit present.     Mental Status: She is alert and oriented to person, place, and time.  Psychiatric:        Mood and Affect: Mood normal.        Behavior: Behavior normal.     No results found for any visits on 04/07/23.      Assessment & Plan:   Problem List Items Addressed This Visit   None Visit Diagnoses     Cellulitis of left lower extremity    -  Primary   Bilateral swelling of feet           Meds ordered this encounter  Medications   cephALEXin (KEFLEX) 500 MG capsule    Sig: Take 1 capsule (500 mg total) by mouth 3  (three) times daily.    Dispense:  21 capsule    Refill:  0   Precautions given for cellulitis.  Elevate the feet as you can while you are home.  Complete antibiotic therapy.  Call the office if symptoms worsen or persist.  Recheck as scheduled and sooner as needed. No follow-ups on file.  Eulis Foster, FNP

## 2023-04-09 ENCOUNTER — Other Ambulatory Visit: Payer: Self-pay | Admitting: Nurse Practitioner

## 2023-04-09 DIAGNOSIS — G479 Sleep disorder, unspecified: Secondary | ICD-10-CM

## 2023-04-12 ENCOUNTER — Encounter: Payer: Medicare HMO | Admitting: *Deleted

## 2023-04-12 ENCOUNTER — Ambulatory Visit: Payer: Medicare HMO | Admitting: Student in an Organized Health Care Education/Training Program

## 2023-04-12 DIAGNOSIS — Z006 Encounter for examination for normal comparison and control in clinical research program: Secondary | ICD-10-CM

## 2023-04-12 DIAGNOSIS — J441 Chronic obstructive pulmonary disease with (acute) exacerbation: Secondary | ICD-10-CM

## 2023-04-12 MED ORDER — STUDY - ARNASA GB44332 - ASTEGOLIMAB 238 MG/1.7 ML OR PLACEBO SQ INJECTION (PI-RAMASWAMY)
476.0000 mg | INJECTION | SUBCUTANEOUS | Status: DC
  Administered 2023-04-12: 476 mg via SUBCUTANEOUS
  Filled 2023-04-12: qty 3.4

## 2023-04-12 NOTE — Research (Cosign Needed)
Title: A Phase III, randomized, double-blind, placebo controlled, multicenter study to evaluate the efficacy and safety of astegolimab in patients with chronic obstructive pulmonary disease    Dose and Duration of Treatment: Astegolimab is presented as a sterile, slightly brown-yellow solution. Each single-use, 2.25 mL pre-filled syringe contains 1.7 mL deliverable volume. Astegolimab drug product is formulated at 140 mg/mL astegolimab with 114 mM succinic acid, 200 mM L-arginine, 10 mM L-methionine, 0.06% (w/v) polysorbate 20, pH 5.7. Placebo for astegolimab is supplied in an identical pre-filled syringe configuration.   Protocol # [id number]   Sponsor: [employer] [address], [place] Protocol: Version 3, dated 26/May2023, Version 4 dated 20Jun2023 IB: version 9.0 dated April 2024 ICF: Main version 12Apr2023, revised 17July2023 Mobile Nursing: 01Sep2023, revised 01Sep2023 Lab Manual: V4.0.0 20Oct2023   Mechanism of action: Astegolimab (also known as [id number] or [id number]) is a fully human, IgG2 monoclonal antibody that binds with high affinity to the interleukin (IL)-33 (IL-33) receptor, ST2, thereby blocking the signaling of IL-33, an inflammatory cytokine of the IL-1 family and member of the "alarmin" class of molecules. Astegolimab binds with high affinity to the human and cynomolgus monkey receptor for IL-33, ST2, and blocks IL-33 binding, thus inhibiting association with the IL-1R accessory protein (AcP) co-receptor and formation of an activated receptor complex.   Key Inclusion Criteria: Age 38-80 years at Visit 1   Documented COPD diagnosis for >=12 months prior to Visit 1 History of frequent exacerbations, defined as having had 2 or more moderate or severe COPD exacerbations within 12 months prior to Visit 1 Post-bronchodilator FEV1 >=20% and <80% of predicted at Visit 1 or Visit 2  Post-bronchodilator FEV1/FVC <0.70 at Visit 1 or Visit 2  mMRC  score >=2 at screening   Current tobacco smoker or former smoker (having stopped smoking for at least 6 months prior to Visit 1) with a history of smoking >=10 pack-years On optimized COPD maintenance therapy as defined below for >=12 months prior to Visit 1            -Inhaled corticosteroid (ICS) plus long-acting beta-agonist (LABA)           -Long-acting muscarinic antagonist (LAMA) plus LABA           -ICS plus LAMA plus LABA  Key Exclusion Criteria: Current documented diagnosis of asthma  Diagnosis of a-1 antitrypsin deficiency  History of long-term treatment with oxygen at >4.0 liters/minute  Any infection that resulted in hospital admission for >=24 hours and/or treatment with oral, IV, or IM antibiotics within 4 weeks prior to Visit 1 or during screening Upper or lower respiratory tract infection within 4 weeks prior to Visit 1 or during screening Treatment with oral, IV, or IM corticosteroids (>10 mg/day prednisolone or equivalent) within 4 weeks prior to initiation of study drug Treatment with a licensed biologic agent (e.g., omalizumab, dupilumab, and/or anti-IL-5 therapies) within 3 months or 5 drug-elimination half-lives (whichever is longer) prior to screening  Planned surgical intervention during the study Known immunodeficiency, including but not limited to, HIV infection  AST, ALT, or total bilirubin elevation >=2.0 x the upper limit of normal (ULN) during screening  History of malignancy within 5 years prior to screening, with the exception of malignancies with a negligible risk of metastasis or death (e.g., 5-year overall survival rate >90%), such as adequately treated carcinoma in situ of the cervix, non-melanoma carcinoma, localized prostate cancer, or ductal carcinoma in situ Unstable cardiac disease, myocardial infarction, or New York Heart  Association Class III or IV heart failure within 12 months prior to screening History or absence of an abnormal ECG that is deemed  clinically significant by the investigator, including complete left bundle branch block or second- or third-degree atrioventricular heart block     Integrated Pharmacokinetic/Pharmacodynamic Analysis:  In Studies [id number], KG40102, and VO53664, exploratory biomarker analysis showed there were consistent decreases in blood eosinophil counts throughout the treatment period, potentially mediated by a direct effect of IL-33 on eosinophil progenitors. In [place], there was no significant difference in fractional exhaled nitric oxide (FeNO) levels (reflecting airway IL-4/IL-13 activity) between astegolimab-treated groups relative to placebo throughout the treatment period. These data suggest that astegolimab has only a limited effect on Type 2 inflammation in asthma. No data are applicable for Study [id number]. No data are available yet for Study [id number].  Special Warnings/Considerations: Administration of astegolimab, a protein therapeutic, may lead to the development of anti-astegolimab antibodies which could lead to AEs and/or decreased exposure. In non-clinical studies (see Section 4.2), ADA incidence has generally been low and there was no apparent ADA impact on PK and safety in these studies. To date, the immunogenicity rates observed with astegolimab in clinical studies have been relatively low as well (see Immunogenicity Section 5.6). Several clinical studies have been conducted in patients with asthma, atopic dermatitis, COPD (IIS Study FI43329) and COVID-19 severe pneumonia, which generally showed low incidence of ADAs (Table 31). There has been no correlation between ADA status and clinical findings or increased incidence of AEs. Astegolimab is now being considered in a larger study for the treatment of COPD. This patient population is typically considered to have a hyper-responsive immune system. Route of administration for this molecule will be [place], either Q2W or Q4W. These factors increase the risk of  development of an immune response to astegolimab, specifically with repeat dosing. From the previous IIS Study [id number], incidence of ADAs was low in COPD patients; this remains to be confirmed in a larger study. To monitor ADA development in ongoing studies, serum samples will be collected from patients at protocol-defined intervals. Patients who test positive for antibodies and have clinical sequelae that are considered potentially related to an ADA response may also be asked to return for additional follow-up testing.  Drug Interaction Studies: No PK drug interaction studies have been conducted to date.  Serious Adverse Reactions Observed in Asthma Study: During the treatment period, a similar proportion of patients across all cohorts experienced at least one AE, regardless of causality (Table 22). In total, 77.2%, 70.9%, 72.2%, and 72.1% of patients reported at least 1 AE in the placebo, 70 mg, 210 mg, and 490 mg groups, respectively. The most common AE's (>5% in any treatment group) were asthma, nasopharyngitis, upper respiratory tract infection, headache, and injection site reaction (ISR). The most common drug-related AE was ISR, which was reported more frequently in the astegolimab treatment groups than in the placebo group (1 [0.8%] patient in the placebo group, 10 [7.9%] patients with 70 mg, 8 [6.3%] patients with 210 mg, 6 [4.9%] patients with 490 mg). All ISRs were non-serious and mild or moderate in severity. During the treatment period, 50 SAEs were reported in 37 (7.4%) patients. The number of patients reporting SAEs was comparable across all cohorts (11 SAEs in 8 patients on placebo, 21 SAEs in 14 patients on 70 mg, 11 SAEs in 9 patients on 210 mg, and 7 SAEs in 6 patients on 490 mg). The most common SAE was asthma. One SAE of moderate  livedo reticularis (70 mg) was considered a suspected unexpected serious adverse drug reaction (SUSAR) related to astegolimab and was reported two days after the  second dose, leading to discontinuation of astegolimab. Two (0.4%) patients reported anaphylaxis and hypersensitivity reactions: 1 severe SAE of anaphylactic reaction (placebo), and 1 moderate hypersensitivity (490 mg) considered related to astegolimab. Three (0.6%) patients experienced a potential Major Adverse Cardiac Event (MACE) (1 patient each in the placebo, 70 mg, and 210 mg groups). None of the potential MACE were considered related to astegolimab. Overall, 233 (46.4%) patients reported events of infection. A comparable number of patients reported infection across all treatment groups (65 [51.2%] patients on placebo, 55 [43.3%] patients on 70 mg, 58 [46.0%] patients on 210 mg, and 55 [45.1%] patients on 490 mg). The most frequently reported infection (>=10% incidence) was nasopharyngitis (12.7%). One patient on astegolimab 210 mg and the partner of one patient on placebo became pregnant during the study. Both delivered normal/healthy babies. Two deaths, unrelated to study drug, were reported: one patient on 210 mg astegolimab died following an SAE of asthma; the other patient on 490 mg astegolimab had an unexplained death. There were no clinically meaningful changes in laboratory parameters, vital signs, or ECG results, other than the 10% decrease in mean blood eosinophil counts in the astegolimab-treated groups, with no safety concerns. Treatment-induced ADAs were comparable between the astegolimab groups and had no impact on safety. Overall, astegolimab was well tolerated at all doses used and had a safety profile consistent with that observed in the previous astegolimab Phase I studies.  Serious Adverse Reactions Observed in Previous COPD Study: In the completed IIS Study [id number], a total of 81 patients received at least one dose of astegolimab or placebo. The safety profile of astegolimab was similar to that of placebo. There were a total of 222 AEs reported in 62 patients. A total of 28 (72%)  patients in the placebo arm and 34 (81%) patients in the astegolimab arm reported at least one AE. The most commonly reported AEs were headache followed by urinary tract infection and viral upper respiratory tract infection. A total of 39 SAEs were reported in 28 (35%) patients; 16 (41%) patients in the placebo group and 12 (29%) in the astegolimab group. The most commonly reported SAE was hospital admission for community acquired pneumonia. Four SAEs resulted in patient discontinuation from treatment (1 patient in the placebo group and 3 patients in the astegolimab group). One patient in the placebo group experienced an AESI of potential MACE (heart failure, unrelated to the study treatment). No anaphylaxis or pregnancies were reported. Two deaths, unrelated to study treatment, were reported: 1 patient in the placebo group died after hospital acquired pneumonia, and another patient in the placebo group died after pneumonia and type 2 respiratory failure.  Safety Data: Astegolimab has been generally well tolerated. There have been 49 patient deaths across all astegolimab studies (Section 5.5.2), none of which were considered related to astegolimab. A total of 144 subjects experienced a total of 224 SAEs across all astegolimab studies. Of these, an SAE livedo reticularis observed in 1 patient was considered related to astegolimab by the investigator (Section 5.5.3). AEs leading to withdrawal (Section 5.5.4) have generally occurred at a rate similar to what would be expected for clinical trials in the studies' respective indications.   [facility] [place] Clinical Research Coordinator note :    This visit for Subject [id number] with DOB: [birth year] on 12 Apr 2023 for the above protocol  is Visit/Encounter #16  and is for purpose of research.      Subject expressed continued interest and consent in continuing as a study subject. Subject confirmed that there was no change in contact information (e.g. address,  telephone, email). Subject thanked for participation in research and contribution to science.   The Subject was informed that the PI Dr. Marchelle Gearing continues to have oversight of the subject's visits and course  through relevant discussions, reviews and also specifically of this visit by routing of this note to the PI.  [facility] [place] Clinical Research Coordinator note:   This visit for Subject Maria [name] with DOB: 1956-05-24 on 04/12/2023 for the above protocol is Visit/Encounter # 16  and is for purpose of research.   Subject expressed continued interest and consent in continuing as a study subject. Subject confirmed that there was  no  change in contact information (e.g. address, telephone, email). Subject thanked for participation in research and contribution to science.The Subject was yes informed that the PI Murali Ramaswamy,MD continues to have oversight of the subject's visits and course through relevant discussions, reviews, and also specifically of this visit by routing of this note to the PI.  All procedures and assessments were performed per above stated protocol. Subject tolerated injections well. Please refer to subjects paper source binder for further details of this visit.     Signed by  Maria [name], Maria [name]  Clinical Research Coordinator  [facility]  [place], [place] 10:15 AM 04/12/2023

## 2023-04-14 ENCOUNTER — Ambulatory Visit (INDEPENDENT_AMBULATORY_CARE_PROVIDER_SITE_OTHER): Payer: Medicare HMO | Admitting: Adult Health

## 2023-04-14 ENCOUNTER — Encounter: Payer: Self-pay | Admitting: Adult Health

## 2023-04-14 ENCOUNTER — Encounter: Payer: Self-pay | Admitting: Internal Medicine

## 2023-04-14 DIAGNOSIS — Z87891 Personal history of nicotine dependence: Secondary | ICD-10-CM | POA: Diagnosis not present

## 2023-04-14 NOTE — Patient Instructions (Signed)

## 2023-04-14 NOTE — Progress Notes (Signed)
  Virtual Visit via Telephone Note  I connected with Maria [name] , 04/14/23 10:25 AM by a telemedicine application and verified that I am speaking with the correct person using two identifiers.  Location: Patient: home Provider: home   I discussed the limitations of evaluation and management by telemedicine and the availability of in person appointments. The patient expressed understanding and agreed to proceed.   Shared Decision Making Visit Lung Cancer Screening Program [phone])   Eligibility: 67 y.o. Pack Years Smoking History Calculation= 29 (# packs/per year x # years smoked) age 12-59 1ppd Recent History of coughing up blood  no Unexplained weight loss? no ( >Than 15 pounds within the last 6 months ) Prior History Lung / other cancer no (Diagnosis within the last 5 years already requiring surveillance chest CT Scans). Smoking Status Former Smoker Former Smokers: Years since quit: 7 years  Quit Date: 2017  Visit Components: Discussion included one or more decision making aids. YES Discussion included risk/benefits of screening. YES Discussion included potential follow up diagnostic testing for abnormal scans. YES Discussion included meaning and risk of over diagnosis. YES Discussion included meaning and risk of False Positives. YES Discussion included meaning of total radiation exposure. YES  Counseling Included: Importance of adherence to annual lung cancer LDCT screening. YES Impact of comorbidities on ability to participate in the program. YES Ability and willingness to under diagnostic treatment. YES  Smoking Cessation Counseling: Former Smokers:  Discussed the importance of maintaining cigarette abstinence. yes Diagnosis Code: Personal History of Nicotine Dependence. T01.601 Information about tobacco cessation classes and interventions provided to patient. Yes Patient provided with "ticket" for LDCT Scan. yes Written Order for Lung Cancer Screening with LDCT  placed in Epic.  (CT Chest Lung Cancer Screening Low Dose W/O CM) [id number]  Z12.2-Screening of respiratory organs Z87.891-Personal history of nicotine dependence   Danford Bad 04/14/23

## 2023-04-17 ENCOUNTER — Other Ambulatory Visit: Payer: Self-pay | Admitting: Internal Medicine

## 2023-04-17 ENCOUNTER — Ambulatory Visit: Payer: Medicare HMO | Admitting: Student in an Organized Health Care Education/Training Program

## 2023-04-18 ENCOUNTER — Encounter: Payer: Self-pay | Admitting: Nurse Practitioner

## 2023-04-18 ENCOUNTER — Ambulatory Visit (INDEPENDENT_AMBULATORY_CARE_PROVIDER_SITE_OTHER): Payer: Medicare HMO | Admitting: Nurse Practitioner

## 2023-04-18 ENCOUNTER — Ambulatory Visit: Admission: RE | Admit: 2023-04-18 | Payer: Medicare HMO | Source: Ambulatory Visit

## 2023-04-18 ENCOUNTER — Ambulatory Visit (INDEPENDENT_AMBULATORY_CARE_PROVIDER_SITE_OTHER)
Admission: RE | Admit: 2023-04-18 | Discharge: 2023-04-18 | Disposition: A | Discharge status: Home / Self Care | Payer: Medicare HMO | Source: Ambulatory Visit | Attending: Nurse Practitioner

## 2023-04-18 VITALS — BP 118/70 | HR 60 | Temp 97.5°F | Ht 65.5 in | Wt 130.0 lb

## 2023-04-18 DIAGNOSIS — R918 Other nonspecific abnormal finding of lung field: Secondary | ICD-10-CM | POA: Diagnosis not present

## 2023-04-18 DIAGNOSIS — R768 Other specified abnormal immunological findings in serum: Secondary | ICD-10-CM

## 2023-04-18 DIAGNOSIS — J441 Chronic obstructive pulmonary disease with (acute) exacerbation: Secondary | ICD-10-CM

## 2023-04-18 DIAGNOSIS — R051 Acute cough: Secondary | ICD-10-CM

## 2023-04-18 DIAGNOSIS — G44201 Tension-type headache, unspecified, intractable: Secondary | ICD-10-CM | POA: Insufficient documentation

## 2023-04-18 DIAGNOSIS — D692 Other nonthrombocytopenic purpura: Secondary | ICD-10-CM

## 2023-04-18 DIAGNOSIS — R0602 Shortness of breath: Secondary | ICD-10-CM | POA: Diagnosis not present

## 2023-04-18 DIAGNOSIS — Z981 Arthrodesis status: Secondary | ICD-10-CM | POA: Diagnosis not present

## 2023-04-18 MED ORDER — PREDNISONE 20 MG PO TABS
ORAL_TABLET | ORAL | 0 refills | Status: AC
Start: 2023-04-18 — End: 2023-04-24

## 2023-04-18 MED ORDER — AMOXICILLIN-POT CLAVULANATE 875-125 MG PO TABS
1.0000 | ORAL_TABLET | Freq: Two times a day (BID) | ORAL | 0 refills | Status: AC
Start: 2023-04-18 — End: 2023-04-25

## 2023-04-18 MED ORDER — METHOCARBAMOL 500 MG PO TABS: 500.0000 mg | ORAL_TABLET | Freq: Every evening | ORAL | 0 refills | Status: DC | PRN

## 2023-04-18 NOTE — Assessment & Plan Note (Addendum)
Will treat patient for COPD exacerbation with prednisone steroid taper as directed and Augmentin 875-125 mg twice daily for 7 days.  Pending chest x-ray. COVID test negative in office.

## 2023-04-18 NOTE — Assessment & Plan Note (Signed)
Will send in Robaxin 500 mg nightly as needed.  Sedation precautions reviewed neurological exam benign in office

## 2023-04-18 NOTE — Patient Instructions (Signed)
Nice to see you today I have sent in an antibiotic, steroid, and muscle relaxer. The muscle relaxer can cause sedation so use caution  Follow up if you do not improve

## 2023-04-18 NOTE — Assessment & Plan Note (Signed)
Positive autoimmune disease testing.  Ambulatory referral to rheumatology

## 2023-04-18 NOTE — Assessment & Plan Note (Signed)
COVID test in office.

## 2023-04-18 NOTE — Progress Notes (Signed)
Acute Office Visit  Subjective:     Patient ID: Maria [name], female    DOB: 18-Mar-1956, 67 y.o.   MRN: [id number]  Chief Complaint  Patient presents with   Referral    Pt requests rheumatology   Follow-up    Pt states that since Sunday her feet are still swelling. States the medication did work in the beginning but pt states she started to feel like she did before taking Keflex. (Weak, can hardly walk.)    HPI Patient is in today for sick symptoms with a history of  carotid stenosis, COPD, hx of covid pna, chornic rep failure  Patietn was seen on 04/07/2023 and treated for celluitis patient is here for a recheck. States that she had starred feeling better. States that she took the last one firdya morning. States that on Sunday she went to church and had to have a friend drive her home. State sthat she got dizzy, shaky and weak. States that she has had a headache since then. She has tried over the counter NSAID without good relief   Review of Systems  Constitutional:  Positive for chills and malaise/fatigue. Negative for fever.  HENT:  Negative for ear pain and sore throat.   Respiratory:  Positive for cough, sputum production and shortness of breath (worse than normal).   Cardiovascular:  Positive for leg swelling.  Gastrointestinal:  Negative for abdominal pain, constipation, diarrhea, nausea and vomiting.  Neurological:  Positive for dizziness, weakness and headaches.        Objective:    BP 118/70   Pulse 60   Temp (!) 97.5 F (36.4 C) (Oral)   Ht 5' 5.5" (1.664 m)   Wt 130 lb (59 kg)   SpO2 95%   BMI 21.30 kg/m  BP Readings from Last 3 Encounters:  04/18/23 118/70  04/07/23 122/64  04/05/23 100/66   Wt Readings from Last 3 Encounters:  04/18/23 130 lb (59 kg)  04/07/23 134 lb (60.8 kg)  04/05/23 134 lb (60.8 kg)      Physical Exam Vitals and nursing note reviewed.  Constitutional:      Appearance: Normal appearance.  HENT:     Head:      Right Ear:  Tympanic membrane, ear canal and external ear normal.     Left Ear: Tympanic membrane, ear canal and external ear normal.     Mouth/Throat:     Mouth: Mucous membranes are moist.     Pharynx: Oropharynx is clear.  Eyes:     Extraocular Movements: Extraocular movements intact.     Pupils: Pupils are equal, round, and reactive to light.  Cardiovascular:     Rate and Rhythm: Normal rate and regular rhythm.     Heart sounds: Normal heart sounds.  Pulmonary:     Effort: Pulmonary effort is normal.     Comments: Decreased globally  Musculoskeletal:        General: Tenderness present.       Arms:     Right lower leg: No edema.     Left lower leg: No edema.  Lymphadenopathy:     Cervical: No cervical adenopathy.  Neurological:     General: No focal deficit present.     Mental Status: She is alert.     Deep Tendon Reflexes:     Reflex Scores:      Bicep reflexes are 2+ on the right side and 2+ on the left side.      Patellar reflexes  are 2+ on the right side and 2+ on the left side.    Comments: Bilateral upper and lower extremity strength 5/5     No results found for any visits on 04/18/23.      Assessment & Plan:   Problem List Items Addressed This Visit       Respiratory   COPD with acute exacerbation (HCC) - Primary    Will treat patient for COPD exacerbation with prednisone steroid taper as directed and Augmentin 875-125 mg twice daily for 7 days.  Pending chest x-ray. COVID test negative in office.      Relevant Medications   amoxicillin-clavulanate (AUGMENTIN) 875-125 MG tablet   predniSONE (DELTASONE) 20 MG tablet   Other Relevant Orders   DG Chest 2 View (Completed)     Other   Acute cough    COVID test in office.      Relevant Orders   DG Chest 2 View (Completed)   Acute intractable tension-type headache    Will send in Robaxin 500 mg nightly as needed.  Sedation precautions reviewed neurological exam benign in office      Relevant Medications    methocarbamol (ROBAXIN) 500 MG tablet   Positive ANA (antinuclear antibody)    Positive autoimmune disease testing.  Ambulatory referral to rheumatology      Relevant Orders   Ambulatory referral to Rheumatology    Meds ordered this encounter  Medications   methocarbamol (ROBAXIN) 500 MG tablet    Sig: Take 1 tablet (500 mg total) by mouth at bedtime as needed for muscle spasms.    Dispense:  15 tablet    Refill:  0    Order Specific Question:   Supervising Provider    Answer:   Roxy Manns A [[id number]]   amoxicillin-clavulanate (AUGMENTIN) 875-125 MG tablet    Sig: Take 1 tablet by mouth 2 (two) times daily for 7 days.    Dispense:  14 tablet    Refill:  0    Order Specific Question:   Supervising Provider    Answer:   Milinda Antis, MARNE A [[id number]]   predniSONE (DELTASONE) 20 MG tablet    Sig: Take 2 tablets (40 mg total) by mouth daily with breakfast for 3 days, THEN 1 tablet (20 mg total) daily with breakfast for 3 days.    Dispense:  9 tablet    Refill:  0    Order Specific Question:   Supervising Provider    Answer:   TOWER, MARNE A [[id number]]    Return if symptoms worsen or fail to improve.  Audria Nine, NP

## 2023-04-20 ENCOUNTER — Encounter: Payer: Self-pay | Admitting: Nurse Practitioner

## 2023-04-20 LAB — POC COVID19 BINAXNOW: SARS Coronavirus 2 Ag: NEGATIVE

## 2023-04-20 NOTE — Addendum Note (Signed)
Addended by: Melina Copa on: 04/20/2023 04:28 PM   Modules accepted: Orders

## 2023-04-21 ENCOUNTER — Encounter: Payer: Self-pay | Admitting: Student in an Organized Health Care Education/Training Program

## 2023-04-21 ENCOUNTER — Ambulatory Visit
Admission: RE | Admit: 2023-04-21 | Discharge: 2023-04-21 | Disposition: A | Discharge status: Home / Self Care | Payer: Medicare HMO | Source: Ambulatory Visit | Attending: Acute Care | Admitting: Acute Care

## 2023-04-21 DIAGNOSIS — Z87891 Personal history of nicotine dependence: Secondary | ICD-10-CM | POA: Diagnosis not present

## 2023-04-21 DIAGNOSIS — F17210 Nicotine dependence, cigarettes, uncomplicated: Secondary | ICD-10-CM | POA: Diagnosis not present

## 2023-04-21 DIAGNOSIS — Z122 Encounter for screening for malignant neoplasm of respiratory organs: Secondary | ICD-10-CM | POA: Insufficient documentation

## 2023-04-24 ENCOUNTER — Other Ambulatory Visit: Payer: Self-pay | Admitting: Internal Medicine

## 2023-04-24 DIAGNOSIS — Z006 Encounter for examination for normal comparison and control in clinical research program: Secondary | ICD-10-CM

## 2023-04-24 DIAGNOSIS — R0602 Shortness of breath: Secondary | ICD-10-CM

## 2023-04-24 DIAGNOSIS — J449 Chronic obstructive pulmonary disease, unspecified: Secondary | ICD-10-CM

## 2023-04-24 DIAGNOSIS — J9611 Chronic respiratory failure with hypoxia: Secondary | ICD-10-CM | POA: Diagnosis not present

## 2023-04-26 ENCOUNTER — Encounter: Payer: Medicare HMO | Admitting: *Deleted

## 2023-04-26 ENCOUNTER — Telehealth: Payer: Self-pay | Admitting: Internal Medicine

## 2023-04-26 DIAGNOSIS — Z006 Encounter for examination for normal comparison and control in clinical research program: Secondary | ICD-10-CM

## 2023-04-26 DIAGNOSIS — J441 Chronic obstructive pulmonary disease with (acute) exacerbation: Secondary | ICD-10-CM

## 2023-04-26 MED ORDER — AZITHROMYCIN 250 MG PO TABS: ORAL_TABLET | ORAL | 0 refills | Status: AC

## 2023-04-26 MED ORDER — PREDNISONE 10 MG PO TABS: ORAL_TABLET | ORAL | 0 refills | Status: DC

## 2023-04-26 MED ORDER — STUDY - ARNASA GB44332 - ASTEGOLIMAB 238 MG/1.7 ML OR PLACEBO SQ INJECTION (PI-RAMASWAMY)
476.0000 mg | INJECTION | SUBCUTANEOUS | Status: DC
  Administered 2023-04-26: 476 mg via SUBCUTANEOUS
  Filled 2023-04-26: qty 3.4

## 2023-04-26 NOTE — Telephone Encounter (Signed)
Got notified by research team to me as standard of care MD and PI that patient AECOPD is unesolved. Called patient -> no fever , no hemoptysis but using o2 from 3 to 4L [place], continued cough , yellow sputum an dincread dyspnea. Not at a point she feels she needs to go to ER  Plan - .Please take Take prednisone 40mg  once daily x 3 days, then 30mg  once daily x 3 days, then 20mg  once daily x 3 days, then prednisone 10mg  once daily  x 3 days and stop  - Take Z PAK  - go to ER if worse  Allergies  Allergen Reactions   Montelukast     Bad dreams

## 2023-04-26 NOTE — Telephone Encounter (Signed)
Triage  Thanks for sending in the pred and Z pak - just reviwed/visualized the CT chest 04/21/23 where official report is pending. She has NEW PNEUMONIA Right lower lobe. Therefore additional plan  - do cxr 04/26/2023 or 04/27/23 - dc cbc, bmet, mag, phos, lft, procalcitonin and lactic acid 04/26/2023 - results STAT - no change in antibiotic/prednisone plan  - low threshold to go to ER  Pls let her know

## 2023-04-26 NOTE — Telephone Encounter (Signed)
Spoke to patient. She is aware of below message/recommendations and voiced her understanding.  Prednisone and zpak has been sent to preferred pharmacy. Nothing further needed.

## 2023-04-26 NOTE — Research (Cosign Needed)
Title: A Phase III, randomized, double-blind, placebo controlled, multicenter study to evaluate the efficacy and safety of astegolimab in patients with chronic obstructive pulmonary disease    Dose and Duration of Treatment: Astegolimab is presented as a sterile, slightly brown-yellow solution. Each single-use, 2.25 mL pre-filled syringe contains 1.7 mL deliverable volume. Astegolimab drug product is formulated at 140 mg/mL astegolimab with 114 mM succinic acid, 200 mM L-arginine, 10 mM L-methionine, 0.06% (w/v) polysorbate 20, pH 5.7. Placebo for astegolimab is supplied in an identical pre-filled syringe configuration.   Protocol # [id number] Protocol: Version 3, dated 26/May2023, Version 4 dated 20Jun2023 IB: version 9.0 dated April 2024 ICF: Main version 12Apr2023, revised 17July2023 Mobile Nursing: 01Sep2023, revised 01Sep2023 Lab Manual: V4.0.0 20Oct2023  Training: Pharmacy Manual v2.0     Mechanism of action: Astegolimab (also known as [id number] or [id number]) is a fully human, IgG2 monoclonal antibody that binds with high affinity to the interleukin (IL)-33 (IL-33) receptor, ST2, thereby blocking the signaling of IL-33, an inflammatory cytokine of the IL-1 family and member of the "alarmin" class of molecules. Astegolimab binds with high affinity to the human and cynomolgus monkey receptor for IL-33, ST2, and blocks IL-33 binding, thus inhibiting association with the IL-1R accessory protein (AcP) co-receptor and formation of an activated receptor complex.   Key Inclusion Criteria: Age 57-80 years at Visit 1   Documented COPD diagnosis for >=12 months prior to Visit 1 History of frequent exacerbations, defined as having had 2 or more moderate or severe COPD exacerbations within 12 months prior to Visit 1 Post-bronchodilator FEV1 >=20% and <80% of predicted at Visit 1 or Visit 2  Post-bronchodilator FEV1/FVC <0.70 at Visit 1 or Visit 2  mMRC score >=2 at screening   Current tobacco smoker or  former smoker (having stopped smoking for at least 6 months prior to Visit 1) with a history of smoking >=10 pack-years On optimized COPD maintenance therapy as defined below for >=12 months prior to Visit 1            -Inhaled corticosteroid (ICS) plus long-acting beta-agonist (LABA)           -Long-acting muscarinic antagonist (LAMA) plus LABA           -ICS plus LAMA plus LABA  Key Exclusion Criteria: Current documented diagnosis of asthma  Diagnosis of a-1 antitrypsin deficiency  History of long-term treatment with oxygen at >4.0 liters/minute  Any infection that resulted in hospital admission for >=24 hours and/or treatment with oral, IV, or IM antibiotics within 4 weeks prior to Visit 1 or during screening Upper or lower respiratory tract infection within 4 weeks prior to Visit 1 or during screening Treatment with oral, IV, or IM corticosteroids (>10 mg/day prednisolone or equivalent) within 4 weeks prior to initiation of study drug Treatment with a licensed biologic agent (e.g., omalizumab, dupilumab, and/or anti-IL-5 therapies) within 3 months or 5 drug-elimination half-lives (whichever is longer) prior to screening  Planned surgical intervention during the study Known immunodeficiency, including but not limited to, HIV infection  AST, ALT, or total bilirubin elevation >=2.0 x the upper limit of normal (ULN) during screening  History of malignancy within 5 years prior to screening, with the exception of malignancies with a negligible risk of metastasis or death (e.g., 5-year overall survival rate >90%), such as adequately treated carcinoma in situ of the cervix, non-melanoma carcinoma, localized prostate cancer, or ductal carcinoma in situ Unstable cardiac disease, myocardial infarction, or New York Heart Association Class III or IV heart  failure within 12 months prior to screening History or absence of an abnormal ECG that is deemed clinically significant by the investigator, including  complete left bundle branch block or second- or third-degree atrioventricular heart block     Integrated Pharmacokinetic/Pharmacodynamic Analysis:  In Studies [id number], [id number], and [id number], exploratory biomarker analysis showed there were consistent decreases in blood eosinophil counts throughout the treatment period, potentially mediated by a direct effect of IL-33 on eosinophil progenitors. In [place], there was no significant difference in fractional exhaled nitric oxide (FeNO) levels (reflecting airway IL-4/IL-13 activity) between astegolimab-treated groups relative to placebo throughout the treatment period. These data suggest that astegolimab has only a limited effect on Type 2 inflammation in asthma. No data are applicable for Study [id number]. No data are available yet for Study [id number].  Special Warnings/Considerations: Administration of astegolimab, a protein therapeutic, may lead to the development of anti-astegolimab antibodies which could lead to AEs and/or decreased exposure. In non-clinical studies (see Section 4.2), ADA incidence has generally been low and there was no apparent ADA impact on PK and safety in these studies. To date, the immunogenicity rates observed with astegolimab in clinical studies have been relatively low as well (see Immunogenicity Section 5.6). Several clinical studies have been conducted in patients with asthma, atopic dermatitis, COPD (IIS Study [id number]) and COVID-19 severe pneumonia, which generally showed low incidence of ADAs (Table 31). There has been no correlation between ADA status and clinical findings or increased incidence of AEs. Astegolimab is now being considered in a larger study for the treatment of COPD. This patient population is typically considered to have a hyper-responsive immune system. Route of administration for this molecule will be [place], either Q2W or Q4W. These factors increase the risk of development of an immune response to astegolimab,  specifically with repeat dosing. From the previous IIS Study [id number], incidence of ADAs was low in COPD patients; this remains to be confirmed in a larger study. To monitor ADA development in ongoing studies, serum samples will be collected from patients at protocol-defined intervals. Patients who test positive for antibodies and have clinical sequelae that are considered potentially related to an ADA response may also be asked to return for additional follow-up testing.  Drug Interaction Studies: No PK drug interaction studies have been conducted to date.  Serious Adverse Reactions Observed in Asthma Study: During the treatment period, a similar proportion of patients across all cohorts experienced at least one AE, regardless of causality (Table 22). In total, 77.2%, 70.9%, 72.2%, and 72.1% of patients reported at least 1 AE in the placebo, 70 mg, 210 mg, and 490 mg groups, respectively. The most common AE's (>5% in any treatment group) were asthma, nasopharyngitis, upper respiratory tract infection, headache, and injection site reaction (ISR). The most common drug-related AE was ISR, which was reported more frequently in the astegolimab treatment groups than in the placebo group (1 [0.8%] patient in the placebo group, 10 [7.9%] patients with 70 mg, 8 [6.3%] patients with 210 mg, 6 [4.9%] patients with 490 mg). All ISRs were non-serious and mild or moderate in severity. During the treatment period, 50 SAEs were reported in 37 (7.4%) patients. The number of patients reporting SAEs was comparable across all cohorts (11 SAEs in 8 patients on placebo, 21 SAEs in 14 patients on 70 mg, 11 SAEs in 9 patients on 210 mg, and 7 SAEs in 6 patients on 490 mg). The most common SAE was asthma. One SAE of moderate livedo reticularis (70 mg) was considered  a suspected unexpected serious adverse drug reaction (SUSAR) related to astegolimab and was reported two days after the second dose, leading to discontinuation of  astegolimab. Two (0.4%) patients reported anaphylaxis and hypersensitivity reactions: 1 severe SAE of anaphylactic reaction (placebo), and 1 moderate hypersensitivity (490 mg) considered related to astegolimab. Three (0.6%) patients experienced a potential Major Adverse Cardiac Event (MACE) (1 patient each in the placebo, 70 mg, and 210 mg groups). None of the potential MACE were considered related to astegolimab. Overall, 233 (46.4%) patients reported events of infection. A comparable number of patients reported infection across all treatment groups (65 [51.2%] patients on placebo, 55 [43.3%] patients on 70 mg, 58 [46.0%] patients on 210 mg, and 55 [45.1%] patients on 490 mg). The most frequently reported infection (>=10% incidence) was nasopharyngitis (12.7%). One patient on astegolimab 210 mg and the partner of one patient on placebo became pregnant during the study. Both delivered normal/healthy babies. Two deaths, unrelated to study drug, were reported: one patient on 210 mg astegolimab died following an SAE of asthma; the other patient on 490 mg astegolimab had an unexplained death. There were no clinically meaningful changes in laboratory parameters, vital signs, or ECG results, other than the 10% decrease in mean blood eosinophil counts in the astegolimab-treated groups, with no safety concerns. Treatment-induced ADAs were comparable between the astegolimab groups and had no impact on safety. Overall, astegolimab was well tolerated at all doses used and had a safety profile consistent with that observed in the previous astegolimab Phase I studies.  Serious Adverse Reactions Observed in Previous COPD Study: In the completed IIS Study [id number], a total of 81 patients received at least one dose of astegolimab or placebo. The safety profile of astegolimab was similar to that of placebo. There were a total of 222 AEs reported in 62 patients. A total of 28 (72%) patients in the placebo arm and 34 (81%) patients  in the astegolimab arm reported at least one AE. The most commonly reported AEs were headache followed by urinary tract infection and viral upper respiratory tract infection. A total of 39 SAEs were reported in 28 (35%) patients; 16 (41%) patients in the placebo group and 12 (29%) in the astegolimab group. The most commonly reported SAE was hospital admission for community acquired pneumonia. Four SAEs resulted in patient discontinuation from treatment (1 patient in the placebo group and 3 patients in the astegolimab group). One patient in the placebo group experienced an AESI of potential MACE (heart failure, unrelated to the study treatment). No anaphylaxis or pregnancies were reported. Two deaths, unrelated to study treatment, were reported: 1 patient in the placebo group died after hospital acquired pneumonia, and another patient in the placebo group died after pneumonia and type 2 respiratory failure.  Safety Data: Astegolimab has been generally well tolerated. There have been 49 patient deaths across all astegolimab studies (Section 5.5.2), none of which were considered related to astegolimab. A total of 144 subjects experienced a total of 224 SAEs across all astegolimab studies. Of these, an SAE livedo reticularis observed in 1 patient was considered related to astegolimab by the investigator (Section 5.5.3). AEs leading to withdrawal (Section 5.5.4) have generally occurred at a rate similar to what would be expected for clinical trials in the studies' respective indications.  [facility] Clinical Research Coordinator note:   This visit for Subject Maria [name] with DOB: 1956-04-12 on 04/26/2023 for the above protocol is Visit/Encounter # [id number]  and is for purpose of research.  Subject expressed continued interest and consent in continuing as a study subject. Subject confirmed that there was  no change in contact information (e.g. address, telephone, email). Subject thanked for  participation in research and contribution to science.  The Subject was yes, informed that the PI Murali Ramaswamy,MD continues to have oversight of the subject's visits and course through relevant discussions, reviews, and also specifically of this visit by routing of this note to the PI.     Signed by  Laurelyn Sickle  Clinical Research Coordinator  [facility]  [place], [place] 11:04 AM 04/26/2023

## 2023-04-28 NOTE — Telephone Encounter (Signed)
I called and spoke with the pt and notified her of results and recommendations from Dr Marchelle Gearing  She verbalized understanding  I have confirmed with Dr. Marchelle Gearing okay for labs and cxr to be done 05/01/23  Orders placed

## 2023-05-01 ENCOUNTER — Other Ambulatory Visit: Payer: Self-pay | Admitting: *Deleted

## 2023-05-01 ENCOUNTER — Encounter: Payer: Self-pay | Admitting: Internal Medicine

## 2023-05-01 ENCOUNTER — Ambulatory Visit (INDEPENDENT_AMBULATORY_CARE_PROVIDER_SITE_OTHER): Payer: Medicare HMO

## 2023-05-01 ENCOUNTER — Other Ambulatory Visit (INDEPENDENT_AMBULATORY_CARE_PROVIDER_SITE_OTHER): Payer: Medicare HMO

## 2023-05-01 ENCOUNTER — Other Ambulatory Visit: Payer: Medicare HMO

## 2023-05-01 DIAGNOSIS — S92352S Displaced fracture of fifth metatarsal bone, left foot, sequela: Secondary | ICD-10-CM | POA: Diagnosis not present

## 2023-05-01 DIAGNOSIS — R0602 Shortness of breath: Secondary | ICD-10-CM

## 2023-05-01 DIAGNOSIS — M25511 Pain in right shoulder: Secondary | ICD-10-CM | POA: Diagnosis not present

## 2023-05-01 DIAGNOSIS — J449 Chronic obstructive pulmonary disease, unspecified: Secondary | ICD-10-CM

## 2023-05-01 DIAGNOSIS — J9611 Chronic respiratory failure with hypoxia: Secondary | ICD-10-CM | POA: Diagnosis not present

## 2023-05-01 DIAGNOSIS — J441 Chronic obstructive pulmonary disease with (acute) exacerbation: Secondary | ICD-10-CM

## 2023-05-01 DIAGNOSIS — Z006 Encounter for examination for normal comparison and control in clinical research program: Secondary | ICD-10-CM

## 2023-05-01 DIAGNOSIS — Z981 Arthrodesis status: Secondary | ICD-10-CM | POA: Diagnosis not present

## 2023-05-01 DIAGNOSIS — G894 Chronic pain syndrome: Secondary | ICD-10-CM | POA: Diagnosis not present

## 2023-05-01 DIAGNOSIS — Q761 Klippel-Feil syndrome: Secondary | ICD-10-CM | POA: Diagnosis not present

## 2023-05-01 DIAGNOSIS — M5010 Cervical disc disorder with radiculopathy, unspecified cervical region: Secondary | ICD-10-CM | POA: Diagnosis not present

## 2023-05-01 DIAGNOSIS — R918 Other nonspecific abnormal finding of lung field: Secondary | ICD-10-CM | POA: Diagnosis not present

## 2023-05-01 LAB — PHOSPHORUS: Phosphorus: 3.2 mg/dL (ref 2.3–4.6)

## 2023-05-01 LAB — HEPATIC FUNCTION PANEL
ALT: 13 U/L (ref 0–35)
AST: 12 U/L (ref 0–37)
Albumin: 3.9 g/dL (ref 3.5–5.2)
Alkaline Phosphatase: 64 U/L (ref 39–117)
Bilirubin, Direct: 0.1 mg/dL (ref 0.0–0.3)
Total Bilirubin: 0.4 mg/dL (ref 0.2–1.2)
Total Protein: 7 g/dL (ref 6.0–8.3)

## 2023-05-01 LAB — CBC WITH DIFFERENTIAL/PLATELET
Basophils Absolute: 0 10*3/uL (ref 0.0–0.1)
Basophils Relative: 0.2 % (ref 0.0–3.0)
Eosinophils Absolute: 0.1 10*3/uL (ref 0.0–0.7)
Eosinophils Relative: 0.8 % (ref 0.0–5.0)
HCT: 39.5 % (ref 36.0–46.0)
Hemoglobin: 12.8 g/dL (ref 12.0–15.0)
Lymphocytes Relative: 43.3 % (ref 12.0–46.0)
Lymphs Abs: 6.8 10*3/uL — ABNORMAL HIGH (ref 0.7–4.0)
MCHC: 32.3 g/dL (ref 30.0–36.0)
MCV: 96.2 fL (ref 78.0–100.0)
Monocytes Absolute: 0.9 10*3/uL (ref 0.1–1.0)
Monocytes Relative: 5.9 % (ref 3.0–12.0)
Neutro Abs: 7.8 10*3/uL — ABNORMAL HIGH (ref 1.4–7.7)
Neutrophils Relative %: 49.8 % (ref 43.0–77.0)
Platelets: 433 10*3/uL — ABNORMAL HIGH (ref 150.0–400.0)
RBC: 4.1 Mil/uL (ref 3.87–5.11)
RDW: 14.6 % (ref 11.5–15.5)
WBC: 15.6 10*3/uL — ABNORMAL HIGH (ref 4.0–10.5)

## 2023-05-01 LAB — BASIC METABOLIC PANEL
BUN: 18 mg/dL (ref 6–23)
CO2: 32 meq/L (ref 19–32)
Calcium: 10 mg/dL (ref 8.4–10.5)
Chloride: 103 meq/L (ref 96–112)
Creatinine, Ser: 0.88 mg/dL (ref 0.40–1.20)
GFR: 68.59 mL/min (ref >60.00)
Glucose, Bld: 98 mg/dL (ref 70–99)
Potassium: 4.7 meq/L (ref 3.5–5.1)
Sodium: 139 meq/L (ref 135–145)

## 2023-05-01 LAB — MAGNESIUM: Magnesium: 1.9 mg/dL (ref 1.5–2.5)

## 2023-05-03 LAB — LACTIC ACID, PLASMA: LACTIC ACID: 1.8 mmol/L (ref 0.4–1.8)

## 2023-05-04 ENCOUNTER — Encounter: Payer: Self-pay | Admitting: Internal Medicine

## 2023-05-04 DIAGNOSIS — R0602 Shortness of breath: Secondary | ICD-10-CM

## 2023-05-04 LAB — PROCALCITONIN: Procalcitonin: <0.1 ng/mL (ref <0.10)

## 2023-05-04 MED ORDER — LEVOFLOXACIN 500 MG PO TABS: 500.0000 mg | ORAL_TABLET | Freq: Every day | ORAL | 0 refills | Status: DC

## 2023-05-04 MED ORDER — PREDNISONE 10 MG PO TABS: ORAL_TABLET | ORAL | 0 refills | Status: DC

## 2023-05-04 NOTE — Telephone Encounter (Signed)
There eis a RLL Pneumonia on CT and cxr but outside of that have not figured out anything else. The high wbc count goes with that. Really struggling with what to do   If she has done only1 round of abx/pred  Plan  -  take levaquin 500mg  once daily  X 5 days -. Stronger antibiotic. Watch for tendonitis - Please take prednisone 40 mg x1 day, then 30 mg x1 day, then 20 mg x1 day, then 10 mg x1 day, and then 5 mg x1 day and stop     Allergies  Allergen Reactions   Montelukast     Bad dreams

## 2023-05-04 NOTE — Telephone Encounter (Signed)
I called and spoke with the pt and notified of response per MR  She verbalized understanding  I have sent rxs to her preferred pharm  Nothing further needed

## 2023-05-06 NOTE — Telephone Encounter (Signed)
Dr. Marchelle Gearing, please see mychart message conversations and advise on this.

## 2023-05-09 NOTE — Telephone Encounter (Signed)
Blood work is normal.  The chest x-ray when shows the pneumonia to be improving.  I am really at a loss as to why she is not getting better other than her advanced COPD.    Plan - Get an echocardiogram to be done in the next few to several days - Go to the ER especially if worsen significantly symptomatic - Please office visit to see me or nurse practitioner within 1 week -I can also swing by during her research visit tomorrow and check in on her   IMPRESSION: 1. Subtle density overlying the right costophrenic angle appears stable to minimally improved from 04/18/2023 and similar to 02/15/2022. Note is made focal consolidation was seen within the slightly more medial aspect of the posteroinferior right lung on 04/21/2023 CT. Overall no definite pneumonia is seen on radiographs, and the appearance is not significantly changed from baseline 02/15/2022 radiographs. 2. Moderate to high-grade centrilobular emphysematous changes.     Electronically Signed   By: Neita Garnet M.D.   On: 05/01/2023 10:55   Recent Results (from the past 2160 hour(s))  CBC     Status: None   Collection Time: 03/01/23  9:34 AM  Result Value Ref Range   WBC 8.5 4.0 - 10.5 K/uL   RBC 3.96 3.87 - 5.11 Mil/uL   Platelets 315.0 150.0 - 400.0 K/uL   Hemoglobin 12.5 12.0 - 15.0 g/dL   HCT 21.3 08.6 - 57.8 %   MCV 96.5 78.0 - 100.0 fl   MCHC 32.7 30.0 - 36.0 g/dL   RDW 46.9 62.9 - 52.8 %  Comprehensive metabolic panel     Status: Abnormal   Collection Time: 03/01/23  9:34 AM  Result Value Ref Range   Sodium 135 135 - 145 mEq/L   Potassium 3.8 3.5 - 5.1 mEq/L   Chloride 100 96 - 112 mEq/L   CO2 26 19 - 32 mEq/L   Glucose, Bld 69 (L) 70 - 99 mg/dL   BUN 14 6 - 23 mg/dL   Creatinine, Ser 4.13 0.40 - 1.20 mg/dL   Total Bilirubin 0.6 0.2 - 1.2 mg/dL   Alkaline Phosphatase 66 39 - 117 U/L   AST 14 0 - 37 U/L   ALT 9 0 - 35 U/L   Total Protein 6.5 6.0 - 8.3 g/dL   Albumin 4.0 3.5 - 5.2 g/dL   GFR 24.40  >10.27 mL/min    Comment: Calculated using the CKD-EPI Creatinine Equation (2021)   Calcium 9.5 8.4 - 10.5 mg/dL  Hemoglobin O5D     Status: None   Collection Time: 03/01/23  9:34 AM  Result Value Ref Range   Hgb A1c MFr Bld 5.7 4.6 - 6.5 %    Comment: Glycemic Control Guidelines for People with Diabetes:Non Diabetic:  <6%Goal of Therapy: <7%Additional Action Suggested:  >8%   TSH     Status: None   Collection Time: 03/01/23  9:34 AM  Result Value Ref Range   TSH 2.16 0.35 - 5.50 uIU/mL  Lipid panel     Status: None   Collection Time: 03/01/23  9:34 AM  Result Value Ref Range   Cholesterol 143 0 - 200 mg/dL    Comment: ATP III Classification       Desirable:  < 200 mg/dL               Borderline High:  200 - 239 mg/dL          High:  > = 664 mg/dL  Triglycerides 92.0 0.0 - 149.0 mg/dL    Comment: Normal:  <409 mg/dLBorderline High:  150 - 199 mg/dL   HDL 81.19 >14.78 mg/dL   VLDL 29.5 0.0 - 62.1 mg/dL   LDL Cholesterol 69 0 - 99 mg/dL   Total CHOL/HDL Ratio 3     Comment:                Men          Women1/2 Average Risk     3.4          3.3Average Risk          5.0          4.42X Average Risk          9.6          7.13X Average Risk          15.0          11.0                       NonHDL 87.22     Comment: NOTE:  Non-HDL goal should be 30 mg/dL higher than patient's LDL goal (i.e. LDL goal of < 70 mg/dL, would have non-HDL goal of < 100 mg/dL)  Urine Microscopic     Status: Abnormal   Collection Time: 03/01/23  9:34 AM  Result Value Ref Range   WBC, UA 0-2/hpf 0-2/hpf   RBC / HPF 0-2/hpf 0-2/hpf   Squamous Epithelial / HPF Few(5-10/hpf) (A) Rare(0-4/hpf)   Bacteria, UA Few(10-50/hpf) (A) None   Yeast, UA Presence of (A) None  POCT Activated clotting time     Status: None   Collection Time: 03/07/23  9:01 AM  Result Value Ref Range   Activated Clotting Time 269 seconds    Comment: Reference range 74-137 seconds for patients not on anticoagulant therapy.  MRSA Next Gen by PCR,  Nasal     Status: None   Collection Time: 03/07/23 11:35 AM   Specimen: Nasal Mucosa; Nasal Swab  Result Value Ref Range   MRSA by PCR Next Gen NOT DETECTED NOT DETECTED    Comment: (NOTE) The GeneXpert MRSA Assay (FDA approved for NASAL specimens only), is one component of a comprehensive MRSA colonization surveillance program. It is not intended to diagnose MRSA infection nor to guide or monitor treatment for MRSA infections. Test performance is not FDA approved in patients less than 31 years old. Performed at [facility], [address] [place], [place] [address]   Glucose, capillary     Status: None   Collection Time: 03/07/23 11:38 AM  Result Value Ref Range   Glucose-Capillary 90 70 - 99 mg/dL    Comment: Glucose reference range applies only to samples taken after fasting for at least 8 hours.  CBC     Status: Abnormal   Collection Time: 03/08/23  4:42 AM  Result Value Ref Range   WBC 12.0 (H) 4.0 - 10.5 K/uL   RBC 3.40 (L) 3.87 - 5.11 MIL/uL   Hemoglobin 10.4 (L) 12.0 - 15.0 g/dL   HCT 78.4 (L) 69.6 - 29.5 %   MCV 93.8 80.0 - 100.0 fL   MCH 30.6 26.0 - 34.0 pg   MCHC 32.6 30.0 - 36.0 g/dL   RDW 28.4 13.2 - 44.0 %   Platelets 285 150 - 400 K/uL   nRBC 0.0 0.0 - 0.2 %    Comment: Performed at [facility], [address] [place], [place] [address]  Basic metabolic panel     Status: Abnormal   Collection Time: 03/08/23  4:42 AM  Result Value Ref Range   Sodium 138 135 - 145 mmol/L   Potassium 3.3 (L) 3.5 - 5.1 mmol/L   Chloride 105 98 - 111 mmol/L   CO2 27 22 - 32 mmol/L   Glucose, Bld 130 (H) 70 - 99 mg/dL    Comment: Glucose reference range applies only to samples taken after fasting for at least 8 hours.   BUN 8 8 - 23 mg/dL   Creatinine, Ser 1.91 0.44 - 1.00 mg/dL   Calcium 8.5 (L) 8.9 - 10.3 mg/dL   GFR, Estimated >47 >82 mL/min    Comment: (NOTE) Calculated using the CKD-EPI Creatinine Equation (2021)    Anion gap 6 5 - 15    Comment:  Performed at [facility], [address], [place], [place] [address]  Surgical pathology     Status: None   Collection Time: 04/04/23 12:00 AM  Result Value Ref Range   SURGICAL PATHOLOGY      SURGICAL PATHOLOGY [facility] [address], [address] [place], [place] [address] Telephone [phone] or [phone] Fax [phone]  REPORT OF SURGICAL PATHOLOGY   Accession #: [id number] Patient Name: SAMARRIA, KINZEY Visit # : [id number]  MRN: [id number] Physician: Baird Lyons DOB/Age 01/06/1956 (Age: 67) Gender: F Collected Date: 04/04/2023 Received Date: 04/04/2023  FINAL DIAGNOSIS       1. Breast, left, needle core biopsy, UOQ :       - BENIGN BREAST PARENCHYMA WITH STROMAL FIBROSIS, FOCAL FIBROADENOMATOID CHANGE      AND ASSOCIATED MICROCALCIFICATIONS      - NEGATIVE FOR MALIGNANCY       DATE SIGNED OUT: 04/05/2023 ELECTRONIC SIGNATURE : Corey Harold M.D., Dossie Arbour., Pathologist, Electronic Signature  MICROSCOPIC DESCRIPTION  CASE COMMENTS STAINS USED IN DIAGNOSIS: H&E-2 H&E-3 H&E-4 H&E    CLINICAL HISTORY  SPECIMEN(S) OBTAINED 1. Breast, left, needle core biopsy, UOQ  SPECIMEN COMMENTS: 1. TIF: 8:18 CIT; < 5 mi n, calcifications SPECIMEN CLINICAL INFORMATION: 1. R/O DCIS vs benign    Gross Description 1. "Left breast stereo calcs upper outer quadrant", received in formalin is a 2.9 x 1.5 x 0.3 cm aggregate of multiple fibroadipose tissue cores. The specimen is submitted in toto in 1 block (1A).      Collection time: 04/04/23 at 0818 (per container label)      Time in formalin: 04/04/23 at 0819 (per container label)      Cold ischemia time: Less than 5 minutes (per requisition)      AMG 04/04/2023        Report signed out from the following location(s) [place] [address], [place] [address] CLIA #: [id number]  [facility] [address] [place], [place] [address]  CLIA #: [id number]   POC COVID-19     Status: Normal   Collection Time: 04/05/23 11:17 AM  Result Value Ref Range   SARS Coronavirus 2 Ag Negative Negative  Complement, total     Status: Abnormal   Collection Time: 04/05/23 11:39 AM  Result Value Ref Range   Compl, Total (CH50) <13 (L) 31 - 60 U/mL    Comment: Verified by repeat analysis. [place] Kitchen   ANA w/Reflex     Status: Abnormal   Collection Time: 04/05/23 11:39 AM  Result Value Ref Range   Anti Nuclear Antibody (ANA)  Positive (A) Negative  ANCA Profile     Status: None   Collection Time: 04/05/23 11:39 AM  Result Value Ref Range   Anti-MPO Antibodies <0.2 0.0 - 0.9 units   Anti-PR3 Antibodies <0.2 0.0 - 0.9 units   C-ANCA <1:20 Neg:<1:20 titer   P-ANCA <1:20 Neg:<1:20 titer    Comment: The presence of positive fluorescence exhibiting P-ANCA or C-ANCA patterns alone is not specific for the diagnosis of Wegener's Granulomatosis (WG) or microscopic polyangiitis. Decisions about treatment should not be based solely on ANCA IFA results.  The International ANCA Group Consensus recommends follow up testing of positive sera with both PR-3 and MPO-ANCA enzyme immunoassays. As many as 5% serum samples are positive only by EIA. Ref. AM J Clin Pathol 1999;111:507-513.    Atypical pANCA <1:20 Neg:<1:20 titer    Comment: The atypical pANCA pattern has been observed in a significant percentage of patients with ulcerative colitis, primary sclerosing cholangitis and autoimmune hepatitis.   C-reactive protein     Status: None   Collection Time: 04/05/23 11:39 AM  Result Value Ref Range   CRP 15.8 0.5 - 20.0 mg/dL  Sedimentation rate     Status: Abnormal   Collection Time: 04/05/23 11:39 AM  Result Value Ref Range   Sed Rate 37 (H) 0 - 30 mm/hr  Comprehensive metabolic panel     Status: Abnormal   Collection Time: 04/05/23 11:39 AM  Result Value Ref Range   Sodium 136 135 - 145 mEq/L   Potassium 3.7 3.5 - 5.1 mEq/L   Chloride 100 96 -  112 mEq/L   CO2 28 19 - 32 mEq/L   Glucose, Bld 116 (H) 70 - 99 mg/dL   BUN 16 6 - 23 mg/dL   Creatinine, Ser 1.61 0.40 - 1.20 mg/dL   Total Bilirubin 0.8 0.2 - 1.2 mg/dL   Alkaline Phosphatase 81 39 - 117 U/L   AST 16 0 - 37 U/L   ALT 10 0 - 35 U/L   Total Protein 6.2 6.0 - 8.3 g/dL   Albumin 3.7 3.5 - 5.2 g/dL   GFR 09.60 >45.40 mL/min    Comment: Calculated using the CKD-EPI Creatinine Equation (2021)   Calcium 8.9 8.4 - 10.5 mg/dL  CBC     Status: Abnormal   Collection Time: 04/05/23 11:39 AM  Result Value Ref Range   WBC 15.7 (H) 4.0 - 10.5 K/uL   RBC 3.61 (L) 3.87 - 5.11 Mil/uL   Platelets 270.0 150.0 - 400.0 K/uL   Hemoglobin 11.2 (L) 12.0 - 15.0 g/dL   HCT 98.1 (L) 19.1 - 47.8 %   MCV 96.1 78.0 - 100.0 fl   MCHC 32.4 30.0 - 36.0 g/dL   RDW 29.5 62.1 - 30.8 %  ENA+DNA/DS+Sjorgen's     Status: Abnormal   Collection Time: 04/05/23 11:39 AM  Result Value Ref Range   dsDNA Ab 2 0 - 9 IU/mL    Comment:                                    Negative      <5                                    Equivocal  5 - 9  Positive      >9    ENA RNP Ab 2.2 (H) 0.0 - 0.9 AI   ENA SM Ab Ser-aCnc <0.2 0.0 - 0.9 AI   ENA SSA (RO) Ab 0.3 0.0 - 0.9 AI   ENA SSB (LA) Ab <0.2 0.0 - 0.9 AI   See below: Comment     Comment: Autoantibody                       Disease Association ------------------------------------------------------------                         Condition                  Frequency ---------------------   ------------------------   --------- Antinuclear Antibody,    SLE, mixed connective Direct (ANA-D)           tissue diseases ---------------------   ------------------------   --------- dsDNA                    SLE                        40 - 60% ---------------------   ------------------------   --------- Chromatin                Drug induced SLE                90%                          SLE                        48 -  97% ---------------------   ------------------------   --------- SSA (Ro)                 SLE                        25 - 35%                          Sjogren's Syndrome         40 - 70%                          Neonatal Lupus                 100% ---------------------   ------------------------   --------- SSB (La)                 SLE                              10%                          Sjogren's Syndrome              30% ---------------------   -----------------------    --------- Sm (anti-Smith)          SLE                        15 - 30% ---------------------   -----------------------    --------- RNP  Mixed Connective Tissue                          Disease                         95% (U1 nRNP,                SLE                        30 - 50% anti-ribonucleoprotein)  Polymyositis and/or                          Dermatomyositis                 20% ---------------------   ------------------------   --------- Scl-70 (antiDNA          Scleroderma (diffuse)      20 - 35% topoisomerase)           Crest                           13% ---------------------   ------------------------   --------- Jo-1                     Polymyositis and/or                          Dermatomyositis            20 - 40% ---------------------   ------------------------   --------- Centromere B             Scleroderma -  Crest                          variant                         80%   POC COVID-19 BinaxNow     Status: None   Collection Time: 04/20/23  4:27 PM  Result Value Ref Range   SARS Coronavirus 2 Ag Negative Negative  Hepatic function panel     Status: None   Collection Time: 05/01/23  8:55 AM  Result Value Ref Range   Total Bilirubin 0.4 0.2 - 1.2 mg/dL   Bilirubin, Direct 0.1 0.0 - 0.3 mg/dL   Alkaline Phosphatase 64 39 - 117 U/L   AST 12 0 - 37 U/L   ALT 13 0 - 35 U/L   Total Protein 7.0 6.0 - 8.3 g/dL   Albumin 3.9 3.5 - 5.2 g/dL  Phosphorus     Status: None    Collection Time: 05/01/23  8:55 AM  Result Value Ref Range   Phosphorus 3.2 2.3 - 4.6 mg/dL  Magnesium     Status: None   Collection Time: 05/01/23  8:55 AM  Result Value Ref Range   Magnesium 1.9 1.5 - 2.5 mg/dL  Basic Metabolic Panel (BMET)     Status: None   Collection Time: 05/01/23  8:55 AM  Result Value Ref Range   Sodium 139 135 - 145 mEq/L   Potassium 4.7 3.5 - 5.1 mEq/L   Chloride 103 96 - 112 mEq/L   CO2 32 19 - 32 mEq/L   Glucose, Bld 98 70 - 99 mg/dL   BUN  18 6 - 23 mg/dL   Creatinine, Ser 4.09 0.40 - 1.20 mg/dL   GFR 81.19 >14.78 mL/min    Comment: Calculated using the CKD-EPI Creatinine Equation (2021)   Calcium 10.0 8.4 - 10.5 mg/dL  CBC w/Diff     Status: Abnormal   Collection Time: 05/01/23  8:55 AM  Result Value Ref Range   WBC 15.6 (H) 4.0 - 10.5 K/uL   RBC 4.10 3.87 - 5.11 Mil/uL   Hemoglobin 12.8 12.0 - 15.0 g/dL   HCT 29.5 62.1 - 30.8 %   MCV 96.2 78.0 - 100.0 fl   MCHC 32.3 30.0 - 36.0 g/dL   RDW 65.7 84.6 - 96.2 %   Platelets 433.0 (H) 150.0 - 400.0 K/uL   Neutrophils Relative % 49.8 43.0 - 77.0 %   Lymphocytes Relative 43.3 12.0 - 46.0 %   Monocytes Relative 5.9 3.0 - 12.0 %   Eosinophils Relative 0.8 0.0 - 5.0 %   Basophils Relative 0.2 0.0 - 3.0 %   Neutro Abs 7.8 (H) 1.4 - 7.7 K/uL   Lymphs Abs 6.8 (H) 0.7 - 4.0 K/uL   Monocytes Absolute 0.9 0.1 - 1.0 K/uL   Eosinophils Absolute 0.1 0.0 - 0.7 K/uL   Basophils Absolute 0.0 0.0 - 0.1 K/uL  Procalcitonin     Status: None   Collection Time: 05/01/23  9:55 AM  Result Value Ref Range   Procalcitonin <0.10 <0.10 ng/mL    Comment: . Interpretation Guidelines . Diagnosis of systemic bacterial infection/sepsis <0.50 ng/mL: Low risk for sepsis: local bacterial infection possible >=0.50 to <2.00 ng/mL: Sepsis is possible; other conditions possible >=2.00 to <10.00 ng/mL: Sepsis likely >10.00 ng/mL: Severe bacterial sepsis or septic shock probable . Diagnosis of lower respiratory tract  infections: <0.10 ng/mL: Bacterial infection very unlikely >=0.10 to <0.25 ng/mL: Bacterial infection unlikely >=0.25 to <0.50 ng/mL: Bacterial infection likely >=0.50 ng/mL: Bacterial infection very likely . Procalcitonin levels should be evaluated in context of all laboratory findings and the total clinical status of the patient. . For additional information, please refer to http://education.quest[facility].com/faq/FAQ46 (This link is being provided for informational/educational purposes only.)   Lactic acid, plasma     Status: None   Collection Time: 05/01/23  9:58 AM  Result Value Ref Range   LACTIC ACID 1.8 0.4 - 1.8 mmol/L

## 2023-05-10 ENCOUNTER — Encounter (HOSPITAL_COMMUNITY): Payer: Self-pay | Admitting: Family Medicine

## 2023-05-10 ENCOUNTER — Inpatient Hospital Stay (HOSPITAL_COMMUNITY)
Admission: AD | Admit: 2023-05-10 | Discharge: 2023-05-13 | DRG: 190 | Disposition: A | Discharge status: Home / Self Care | Payer: Medicare HMO | Source: Ambulatory Visit | Attending: Student | Admitting: Student

## 2023-05-10 ENCOUNTER — Encounter: Payer: Medicare HMO | Admitting: Internal Medicine

## 2023-05-10 ENCOUNTER — Inpatient Hospital Stay (HOSPITAL_COMMUNITY): Payer: Medicare HMO

## 2023-05-10 ENCOUNTER — Encounter (HOSPITAL_COMMUNITY): Payer: Self-pay

## 2023-05-10 ENCOUNTER — Other Ambulatory Visit: Payer: Self-pay

## 2023-05-10 ENCOUNTER — Telehealth: Payer: Self-pay | Admitting: Acute Care

## 2023-05-10 DIAGNOSIS — I739 Peripheral vascular disease, unspecified: Secondary | ICD-10-CM | POA: Diagnosis present

## 2023-05-10 DIAGNOSIS — J441 Chronic obstructive pulmonary disease with (acute) exacerbation: Secondary | ICD-10-CM | POA: Diagnosis present

## 2023-05-10 DIAGNOSIS — Z7902 Long term (current) use of antithrombotics/antiplatelets: Secondary | ICD-10-CM | POA: Diagnosis not present

## 2023-05-10 DIAGNOSIS — J439 Emphysema, unspecified: Secondary | ICD-10-CM | POA: Diagnosis present

## 2023-05-10 DIAGNOSIS — Z006 Encounter for examination for normal comparison and control in clinical research program: Secondary | ICD-10-CM

## 2023-05-10 DIAGNOSIS — I272 Pulmonary hypertension, unspecified: Secondary | ICD-10-CM | POA: Diagnosis present

## 2023-05-10 DIAGNOSIS — Z8249 Family history of ischemic heart disease and other diseases of the circulatory system: Secondary | ICD-10-CM | POA: Diagnosis not present

## 2023-05-10 DIAGNOSIS — R911 Solitary pulmonary nodule: Secondary | ICD-10-CM | POA: Diagnosis present

## 2023-05-10 DIAGNOSIS — Z841 Family history of disorders of kidney and ureter: Secondary | ICD-10-CM

## 2023-05-10 DIAGNOSIS — Z7982 Long term (current) use of aspirin: Secondary | ICD-10-CM

## 2023-05-10 DIAGNOSIS — Z87891 Personal history of nicotine dependence: Secondary | ICD-10-CM

## 2023-05-10 DIAGNOSIS — Z9981 Dependence on supplemental oxygen: Secondary | ICD-10-CM

## 2023-05-10 DIAGNOSIS — Z66 Do not resuscitate: Secondary | ICD-10-CM | POA: Diagnosis present

## 2023-05-10 DIAGNOSIS — Z8261 Family history of arthritis: Secondary | ICD-10-CM

## 2023-05-10 DIAGNOSIS — J9621 Acute and chronic respiratory failure with hypoxia: Secondary | ICD-10-CM | POA: Diagnosis present

## 2023-05-10 DIAGNOSIS — I700 Atherosclerosis of aorta: Secondary | ICD-10-CM | POA: Diagnosis not present

## 2023-05-10 DIAGNOSIS — Z833 Family history of diabetes mellitus: Secondary | ICD-10-CM | POA: Diagnosis not present

## 2023-05-10 DIAGNOSIS — G629 Polyneuropathy, unspecified: Secondary | ICD-10-CM | POA: Diagnosis present

## 2023-05-10 DIAGNOSIS — E785 Hyperlipidemia, unspecified: Secondary | ICD-10-CM | POA: Diagnosis present

## 2023-05-10 DIAGNOSIS — R0603 Acute respiratory distress: Secondary | ICD-10-CM | POA: Diagnosis not present

## 2023-05-10 DIAGNOSIS — K219 Gastro-esophageal reflux disease without esophagitis: Secondary | ICD-10-CM | POA: Diagnosis present

## 2023-05-10 DIAGNOSIS — Z8616 Personal history of COVID-19: Secondary | ICD-10-CM

## 2023-05-10 DIAGNOSIS — R918 Other nonspecific abnormal finding of lung field: Secondary | ICD-10-CM | POA: Diagnosis not present

## 2023-05-10 DIAGNOSIS — F32A Depression, unspecified: Secondary | ICD-10-CM | POA: Diagnosis present

## 2023-05-10 DIAGNOSIS — Z79899 Other long term (current) drug therapy: Secondary | ICD-10-CM | POA: Diagnosis not present

## 2023-05-10 DIAGNOSIS — Z825 Family history of asthma and other chronic lower respiratory diseases: Secondary | ICD-10-CM | POA: Diagnosis not present

## 2023-05-10 DIAGNOSIS — I2510 Atherosclerotic heart disease of native coronary artery without angina pectoris: Secondary | ICD-10-CM | POA: Diagnosis not present

## 2023-05-10 DIAGNOSIS — I6529 Occlusion and stenosis of unspecified carotid artery: Secondary | ICD-10-CM | POA: Diagnosis present

## 2023-05-10 LAB — CBC WITH DIFFERENTIAL/PLATELET
Abs Immature Granulocytes: 0.05 10*3/uL (ref 0.00–0.07)
Basophils Absolute: 0 10*3/uL (ref 0.0–0.1)
Basophils Relative: 0 %
Eosinophils Absolute: 0.1 10*3/uL (ref 0.0–0.5)
Eosinophils Relative: 1 %
HCT: 36.4 % (ref 36.0–46.0)
Hemoglobin: 11.9 g/dL — ABNORMAL LOW (ref 12.0–15.0)
Immature Granulocytes: 0 %
Lymphocytes Relative: 25 %
Lymphs Abs: 3.2 10*3/uL (ref 0.7–4.0)
MCH: 31.7 pg (ref 26.0–34.0)
MCHC: 32.7 g/dL (ref 30.0–36.0)
MCV: 97.1 fL (ref 80.0–100.0)
Monocytes Absolute: 0.6 10*3/uL (ref 0.1–1.0)
Monocytes Relative: 5 %
Neutro Abs: 9.1 10*3/uL — ABNORMAL HIGH (ref 1.7–7.7)
Neutrophils Relative %: 69 %
Platelets: 300 10*3/uL (ref 150–400)
RBC: 3.75 MIL/uL — ABNORMAL LOW (ref 3.87–5.11)
RDW: 14.5 % (ref 11.5–15.5)
WBC: 13.1 10*3/uL — ABNORMAL HIGH (ref 4.0–10.5)
nRBC: 0 % (ref 0.0–0.2)

## 2023-05-10 LAB — COMPREHENSIVE METABOLIC PANEL
ALT: 18 U/L (ref 0–44)
AST: 16 U/L (ref 15–41)
Albumin: 3.7 g/dL (ref 3.5–5.0)
Alkaline Phosphatase: 64 U/L (ref 38–126)
Anion gap: 8 (ref 5–15)
BUN: 18 mg/dL (ref 8–23)
CO2: 26 mmol/L (ref 22–32)
Calcium: 8.9 mg/dL (ref 8.9–10.3)
Chloride: 106 mmol/L (ref 98–111)
Creatinine, Ser: 0.74 mg/dL (ref 0.44–1.00)
GFR, Estimated: >60 mL/min (ref >60)
Glucose, Bld: 118 mg/dL — ABNORMAL HIGH (ref 70–99)
Potassium: 3.5 mmol/L (ref 3.5–5.1)
Sodium: 140 mmol/L (ref 135–145)
Total Bilirubin: 0.5 mg/dL (ref 0.3–1.2)
Total Protein: 6.7 g/dL (ref 6.5–8.1)

## 2023-05-10 LAB — EXPECTORATED SPUTUM ASSESSMENT W GRAM STAIN, RFLX TO RESP C

## 2023-05-10 LAB — HIV ANTIBODY (ROUTINE TESTING W REFLEX): HIV Screen 4th Generation wRfx: NONREACTIVE

## 2023-05-10 MED ORDER — PANTOPRAZOLE SODIUM 40 MG PO TBEC
40.0000 mg | DELAYED_RELEASE_TABLET | Freq: Every day | ORAL | Status: DC
  Administered 2023-05-11 – 2023-05-13 (×3): 40 mg via ORAL
  Filled 2023-05-10 (×3): qty 1

## 2023-05-10 MED ORDER — ACETAMINOPHEN 325 MG PO TABS: 650.0000 mg | ORAL_TABLET | Freq: Four times a day (QID) | ORAL | Status: DC | PRN

## 2023-05-10 MED ORDER — SODIUM CHLORIDE 0.9 % IV SOLN
1.0000 g | INTRAVENOUS | Status: DC
  Administered 2023-05-10 – 2023-05-11 (×2): 1 g via INTRAVENOUS
  Filled 2023-05-10 (×2): qty 10

## 2023-05-10 MED ORDER — ONDANSETRON HCL 4 MG/2ML IJ SOLN: 4.0000 mg | Freq: Four times a day (QID) | INTRAMUSCULAR | Status: DC | PRN

## 2023-05-10 MED ORDER — ESCITALOPRAM OXALATE 20 MG PO TABS
20.0000 mg | ORAL_TABLET | Freq: Every day | ORAL | Status: DC
  Administered 2023-05-11 – 2023-05-13 (×3): 20 mg via ORAL
  Filled 2023-05-10 (×3): qty 1

## 2023-05-10 MED ORDER — IPRATROPIUM-ALBUTEROL 0.5-2.5 (3) MG/3ML IN SOLN
3.0000 mL | Freq: Four times a day (QID) | RESPIRATORY_TRACT | Status: DC
  Administered 2023-05-10 – 2023-05-11 (×3): 3 mL via RESPIRATORY_TRACT
  Filled 2023-05-10 (×3): qty 3

## 2023-05-10 MED ORDER — MAGNESIUM HYDROXIDE 400 MG/5ML PO SUSP: 30.0000 mL | Freq: Every day | ORAL | Status: DC | PRN

## 2023-05-10 MED ORDER — GUAIFENESIN ER 600 MG PO TB12
600.0000 mg | ORAL_TABLET | Freq: Two times a day (BID) | ORAL | Status: DC
  Administered 2023-05-10 – 2023-05-13 (×6): 600 mg via ORAL
  Filled 2023-05-10 (×6): qty 1

## 2023-05-10 MED ORDER — ASPIRIN 81 MG PO TBEC
81.0000 mg | DELAYED_RELEASE_TABLET | Freq: Every day | ORAL | Status: DC
  Administered 2023-05-11 – 2023-05-13 (×3): 81 mg via ORAL
  Filled 2023-05-10 (×3): qty 1

## 2023-05-10 MED ORDER — HYDROXYZINE PAMOATE 25 MG PO CAPS: 25.0000 mg | ORAL_CAPSULE | Freq: Every evening | ORAL | Status: DC | PRN

## 2023-05-10 MED ORDER — CLOPIDOGREL BISULFATE 75 MG PO TABS
75.0000 mg | ORAL_TABLET | Freq: Every day | ORAL | Status: DC
  Administered 2023-05-11 – 2023-05-13 (×3): 75 mg via ORAL
  Filled 2023-05-10 (×3): qty 1

## 2023-05-10 MED ORDER — ROFLUMILAST 500 MCG PO TABS
500.0000 ug | ORAL_TABLET | Freq: Every day | ORAL | Status: DC
  Administered 2023-05-11 – 2023-05-13 (×3): 500 ug via ORAL
  Filled 2023-05-10 (×3): qty 1

## 2023-05-10 MED ORDER — ATORVASTATIN CALCIUM 10 MG PO TABS
10.0000 mg | ORAL_TABLET | Freq: Every day | ORAL | Status: DC
  Administered 2023-05-11 – 2023-05-13 (×3): 10 mg via ORAL
  Filled 2023-05-10 (×3): qty 1

## 2023-05-10 MED ORDER — ENOXAPARIN SODIUM 40 MG/0.4ML IJ SOSY
40.0000 mg | PREFILLED_SYRINGE | INTRAMUSCULAR | Status: DC
  Administered 2023-05-10 – 2023-05-12 (×3): 40 mg via SUBCUTANEOUS
  Filled 2023-05-10 (×3): qty 0.4

## 2023-05-10 MED ORDER — TRAZODONE HCL 50 MG PO TABS
25.0000 mg | ORAL_TABLET | Freq: Every evening | ORAL | Status: DC | PRN
  Administered 2023-05-10: 25 mg via ORAL
  Filled 2023-05-10: qty 1

## 2023-05-10 MED ORDER — ACETAMINOPHEN 650 MG RE SUPP: 650.0000 mg | Freq: Four times a day (QID) | RECTAL | Status: DC | PRN

## 2023-05-10 MED ORDER — OXYBUTYNIN CHLORIDE ER 5 MG PO TB24
10.0000 mg | ORAL_TABLET | Freq: Every day | ORAL | Status: DC
  Administered 2023-05-11 – 2023-05-13 (×3): 10 mg via ORAL
  Filled 2023-05-10 (×3): qty 2

## 2023-05-10 MED ORDER — SODIUM CHLORIDE 0.9 % IV SOLN
500.0000 mg | INTRAVENOUS | Status: DC
  Administered 2023-05-10 – 2023-05-11 (×2): 500 mg via INTRAVENOUS
  Filled 2023-05-10 (×2): qty 5

## 2023-05-10 MED ORDER — SODIUM CHLORIDE 3 % IN NEBU
4.0000 mL | INHALATION_SOLUTION | Freq: Two times a day (BID) | RESPIRATORY_TRACT | Status: AC
  Administered 2023-05-10 – 2023-05-13 (×6): 4 mL via RESPIRATORY_TRACT
  Filled 2023-05-10 (×6): qty 4

## 2023-05-10 MED ORDER — PREDNISONE 20 MG PO TABS: 40.0000 mg | ORAL_TABLET | Freq: Every day | ORAL | Status: DC

## 2023-05-10 MED ORDER — HYDROCOD POLI-CHLORPHE POLI ER 10-8 MG/5ML PO SUER: 5.0000 mL | Freq: Two times a day (BID) | ORAL | Status: DC | PRN

## 2023-05-10 MED ORDER — HYDROXYZINE HCL 25 MG PO TABS: 25.0000 mg | ORAL_TABLET | Freq: Every evening | ORAL | Status: DC | PRN

## 2023-05-10 MED ORDER — METHYLPREDNISOLONE SODIUM SUCC 40 MG IJ SOLR
40.0000 mg | Freq: Two times a day (BID) | INTRAMUSCULAR | Status: AC
  Administered 2023-05-10 – 2023-05-11 (×2): 40 mg via INTRAVENOUS
  Filled 2023-05-10 (×2): qty 1

## 2023-05-10 MED ORDER — MIDODRINE HCL 5 MG PO TABS
10.0000 mg | ORAL_TABLET | Freq: Two times a day (BID) | ORAL | Status: DC
  Administered 2023-05-10 – 2023-05-13 (×6): 10 mg via ORAL
  Filled 2023-05-10 (×6): qty 2

## 2023-05-10 MED ORDER — ONDANSETRON HCL 4 MG PO TABS: 4.0000 mg | ORAL_TABLET | Freq: Four times a day (QID) | ORAL | Status: DC | PRN

## 2023-05-10 MED ORDER — SODIUM CHLORIDE 0.9 % IV SOLN
INTRAVENOUS | Status: DC
Start: 2023-05-10 — End: 2023-05-11

## 2023-05-10 MED ORDER — STUDY - ARNASA GB44332 - ASTEGOLIMAB 238 MG/1.7 ML OR PLACEBO SQ INJECTION (PI-RAMASWAMY)
476.0000 mg | INJECTION | SUBCUTANEOUS | Status: AC
  Filled 2023-05-10: qty 3.4

## 2023-05-10 NOTE — Plan of Care (Signed)

## 2023-05-10 NOTE — H&P (Signed)
Home     [place]   PATIENT NAME: Maria [name]    MR#:  [id number]  DATE OF BIRTH:  [birth year]  DATE OF ADMISSION:  05/10/2023  PRIMARY CARE PHYSICIAN: Eden Emms, NP   Patient is coming from: Home  REQUESTING/REFERRING PHYSICIAN: Kalman Shan, MD  CHIEF COMPLAINT:  Worsening cough, dyspnea and wheezing  HISTORY OF PRESENT ILLNESS:  Maria [name] is a 67 y.o. Caucasian female with medical history significant for COPD, chronic respiratory failure on home O2, on a research study for a phase 3 randomized double-blind, placebo controlled multicenter study on Astegolimab (an IL-33 binder), who came into [facility] for scheduled visit but she has been battling aecopd x 1 month.  She failed 2 abx/pred rounds. Now with weight loss, orthopena, sever cough, needing to go to O2 from 3 to 5L [place]. Lot of outpatient blood work normal. CT showed pneumonia but cxr 2d ago was better.  The patient has been having worsening dyspnea with associated chest congestion and cough with inability to expectorate and wheezing.  She used her nebulizer once today.  She denied any fever or chills.  No chest pain or palpitations.  No nausea or vomiting or abdominal pain.  No dysuria, oliguria or hematuria or flank pain.  No bleeding diathesis.  Upon arrival here BP was 129/42 with otherwise normal vitals.  Labs are currently pending.  Most recent CBC on 10/14 showed leukocytosis 15.6 but the patient has been on steroids as well as thrombocytosis of 433.  Procalcitonin then was less than 0.1. EKG as reviewed by me : Pending Imaging: Pending.  The patient is directly admitted to a medical-surgical bed for further evaluation and management. PAST MEDICAL HISTORY:   Past Medical History:  Diagnosis Date   Atherosclerosis 1/0612014   aorta, iliacs and CFA bilaterally no greater than 0-49% - lower arterial doppler.   Blood transfusion without reported diagnosis 2005   Cancer  [facility]) Ovarian 1978   Chest pain    COPD (chronic obstructive pulmonary disease) (HCC)    COVID-19 long hauler manifesting chronic dyspnea    Emphysema of lung (HCC) 2019   Oxygen deficiency 03/2019   Oxygen dependent    3 liters up to 5    PAST SURGICAL HISTORY:   Past Surgical History:  Procedure Laterality Date   ABDOMINAL HYSTERECTOMY  1978   BREAST BIOPSY Left 04/04/2023   Stereo bx, X clip, path pending   BREAST BIOPSY Left 04/04/2023   MM LT BREAST BX W LOC DEV 1ST LESION IMAGE BX SPEC STEREO GUIDE 04/04/2023 [facility]   CAROTID PTA/STENT INTERVENTION Left 03/07/2023   Procedure: CAROTID PTA/STENT INTERVENTION;  Surgeon: Renford Dills, MD;  Location: [facility] INVASIVE CV LAB;  Service: Cardiovascular;  Laterality: Left;   CHOLECYSTECTOMY  2010   COLONOSCOPY     COLONOSCOPY     COLONOSCOPY WITH PROPOFOL N/A 11/01/2021   Procedure: COLONOSCOPY WITH PROPOFOL;  Surgeon: Wyline Mood, MD;  Location: [facility];  Service: Gastroenterology;  Laterality: N/A;   ESOPHAGOGASTRODUODENOSCOPY     NECK SURGERY  1999   OVARIAN CYST REMOVAL  1988   SPINE SURGERY  Neck 2002    SOCIAL HISTORY:   Social History   Tobacco Use   Smoking status: Former    Current packs/day: 0.00    Average packs/day: 1 pack/day for 45.0 years (45.0 ttl pk-yrs)    Types: Cigarettes    Start date: 03/19/1971    Quit date: 03/18/2016  Years since quitting: 7.1    Passive exposure: Past   Smokeless tobacco: Never  Substance Use Topics   Alcohol use: No    Alcohol/week: 0.0 standard drinks of alcohol    FAMILY HISTORY:   Family History  Problem Relation Age of Onset   Heart failure Mother    Diabetes Mother    Kidney disease Mother    Hypertension Mother    Arthritis Mother    Heart disease Mother    Obesity Mother    COPD Father    Hearing loss Father    Lung cancer Brother    COPD Brother    COPD Brother    Asthma Son    Diabetes Son    Birth defects Son    Breast cancer  Maternal Aunt 40    DRUG ALLERGIES:   Allergies  Allergen Reactions   Montelukast     Bad dreams    REVIEW OF SYSTEMS:   As per history of present illness. All pertinent systems were reviewed above. Constitutional, HEENT, cardiovascular, respiratory, GI, GU, musculoskeletal, neuro, psychiatric, endocrine, integumentary and hematologic systems were reviewed and are otherwise negative/unremarkable except for positive findings mentioned above in the HPI.   MEDICATIONS AT HOME:   Prior to Admission medications   Medication Sig Start Date End Date Taking? Authorizing Provider  albuterol (VENTOLIN HFA) 108 (90 Base) MCG/ACT inhaler TAKE 2 PUFFS BY MOUTH EVERY 6 HOURS AS NEEDED FOR WHEEZE OR SHORTNESS OF BREATH 04/26/22   Kalman Shan, MD  ASPIRIN LOW DOSE 81 MG EC tablet TAKE 1 TABLET BY MOUTH EVERY DAY 07/26/21   Gweneth Dimitri, MD  atorvastatin (LIPITOR) 10 MG tablet Take 1 tablet (10 mg total) by mouth daily. 01/20/23   Eden Emms, NP  Azelastine HCl 137 MCG/SPRAY SOLN USE 1 SPRAY INTO EACH NOSTRIL TWICE A DAY 08/17/21   Kalman Shan, MD  benzonatate (TESSALON) 200 MG capsule Take 1 capsule by mouth three times daily as needed 02/17/23   Kalman Shan, MD  Budeson-Glycopyrrol-Formoterol (BREZTRI AEROSPHERE) 160-9-4.8 MCG/ACT AERO Inhale 2 puffs into the lungs in the morning and at bedtime. 08/11/21   Parrett, Virgel Bouquet, NP  Cholecalciferol (VITAMIN D-3) 125 MCG (5000 UT) TABS Take 5,000 Units by mouth daily.    [provider]  clopidogrel (PLAVIX) 75 MG tablet Take 1 tablet (75 mg total) by mouth daily. 02/23/23   Schnier, Latina Craver, MD  escitalopram (LEXAPRO) 20 MG tablet Take 1 tablet (20 mg total) by mouth daily. 01/27/23   Eden Emms, NP  gabapentin (NEURONTIN) 600 MG tablet Take 1 tablet (600 mg total) by mouth every 8 (eight) hours. 11/03/22   Edward Jolly, MD  hydrOXYzine (VISTARIL) 25 MG capsule TAKE 1 CAPSULE BY MOUTH AT BEDTIME AS NEEDED 04/10/23   Eden Emms, NP  ipratropium-albuterol (DUONEB) 0.5-2.5 (3) MG/3ML SOLN USE 1 AMPULE IN NEBULIZER 4 TIMES A DAY AS NEEDED (DX: J84.112, J44.9) 07/26/21   Kalman Shan, MD  levofloxacin (LEVAQUIN) 500 MG tablet Take 1 tablet (500 mg total) by mouth daily. 05/04/23   Kalman Shan, MD  methocarbamol (ROBAXIN) 500 MG tablet Take 1 tablet (500 mg total) by mouth at bedtime as needed for muscle spasms. 04/18/23   Eden Emms, NP  midodrine (PROAMATINE) 10 MG tablet Take 1 tablet (10 mg total) by mouth 2 (two) times daily with a meal. 03/10/23   Georgiana Spinner, NP  oxybutynin (DITROPAN-XL) 10 MG 24 hr tablet Take 1 tablet (10 mg  total) by mouth daily. 06/13/22   Alfredo Martinez, MD  OXYGEN Inhale into the lungs. 3 liters at rest and 5 liters with any activity.-    [provider]  pantoprazole (PROTONIX) 40 MG tablet Take 1 tablet by mouth once daily 08/29/22   Kalman Shan, MD  Potassium 99 MG TABS Take 99 mg by mouth daily.    [provider]  predniSONE (DELTASONE) 10 MG tablet 4tabx3d,3tabx3d,2tabx3d,1tabx3d 04/26/23   Kalman Shan, MD  predniSONE (DELTASONE) 10 MG tablet 4 x 1 day, 3 x 1 day, 2 x 1 day, 1 x 1 day, 1/2 x 1 day then stop 05/04/23   Kalman Shan, MD  Respiratory Therapy Supplies (FLUTTER) DEVI Use as directed 04/22/19   Glenford Bayley, NP  roflumilast (DALIRESP) 500 MCG TABS tablet Take 1 tablet by mouth once daily 04/26/23   Kalman Shan, MD  sodium chloride HYPERTONIC 3 % nebulizer solution Take by nebulization 2 (two) times daily. 08/29/22   Kalman Shan, MD      VITAL SIGNS:  Blood pressure (!) 129/42, pulse 79, temperature 98.7 F (37.1 C), temperature source Axillary, resp. rate 20, SpO2 98%.  PHYSICAL EXAMINATION:  Physical Exam  GENERAL:  67 y.o.-year-old patient lying in the bed with mild respiratory distress with conversational dyspnea. EYES: Pupils equal, round, reactive to light and accommodation. No scleral icterus.  Extraocular muscles intact.  HEENT: Head atraumatic, normocephalic. Oropharynx and nasopharynx clear.  NECK:  Supple, no jugular venous distention. No thyroid enlargement, no tenderness.  LUNGS: Diffuse expiratory wheezes with tight expiratory airflow and harsh vesicular breathing.  No use of accessory muscles of respiration.  CARDIOVASCULAR: Regular rate and rhythm, S1, S2 normal. No murmurs, rubs, or gallops.  ABDOMEN: Soft, nondistended, nontender. Bowel sounds present. No organomegaly or mass.  EXTREMITIES: No pedal edema, cyanosis, or clubbing.  NEUROLOGIC: Cranial nerves II through XII are intact. Muscle strength 5/5 in all extremities. Sensation intact. Gait not checked.  PSYCHIATRIC: The patient is alert and oriented x 3.  Normal affect and good eye contact. SKIN: No obvious rash, lesion, or ulcer.   LABORATORY PANEL:   CBC No results for input(s): "WBC", "HGB", "HCT", "PLT" in the last 168 hours. ------------------------------------------------------------------------------------------------------------------  Chemistries  No results for input(s): "NA", "K", "CL", "CO2", "GLUCOSE", "BUN", "CREATININE", "CALCIUM", "MG", "AST", "ALT", "ALKPHOS", "BILITOT" in the last 168 hours.  Invalid input(s): "GFRCGP" ------------------------------------------------------------------------------------------------------------------  Cardiac Enzymes No results for input(s): "TROPONINI" in the last 168 hours. ------------------------------------------------------------------------------------------------------------------  RADIOLOGY:  No results found.    IMPRESSION AND PLAN:  Assessment and Plan: * COPD exacerbation (HCC) - The patient will be admitted to a med-surg bed. - Will obtain a two-view chest x-ray, CBC, and CMP. - We will place the patient IV steroid therapy with IV Solu-Medrol as well as nebulized bronchodilator therapy with duonebs q.i.d. and q.4 hours p.r.n.Marland Kitchen - Mucolytic  therapy will be provided with Mucinex and antibiotic therapy with IV Rocephin and Zithromax. - Will obtain a sputum Gram stain culture and sensitivity given failure of outpatient antibiotic therapy and persistent productive cough.. - O2 protocol will be followed. - We will hold off long-acting beta agonist during this exacerbation.   Pulmonary hypertension (HCC) - We will continue Roflumilast.  Dyslipidemia - We will continue statin therapy.  PVD (peripheral vascular disease) (HCC) - The patient has symptomatic carotid stenosis. - We will continue aspirin and Plavix.  Peripheral neuropathy - We will continue Neurontin.  Depression - We will continue Lexapro.  GERD without esophagitis -  We will continue PPI therapy.   DVT prophylaxis: Lovenox.  Advanced Care Planning:  Code Status: The patient is DNR only.  This was discussed with her. Family Communication:  The plan of care was discussed in details with the patient (and family). I answered all questions. The patient agreed to proceed with the above mentioned plan. Further management will depend upon hospital course. Disposition Plan: Back to previous home environment Consults called: none.  All the records are reviewed .  Status is: Inpatient  At the time of the admission, it appears that the appropriate admission status for this patient is inpatient.  This is judged to be reasonable and necessary in order to provide the required intensity of service to ensure the patient's safety given the presenting symptoms, physical exam findings and initial radiographic and laboratory data in the context of comorbid conditions.  The patient requires inpatient status due to high intensity of service, high risk of further deterioration and high frequency of surveillance required.  I certify that at the time of admission, it is my clinical judgment that the patient will require inpatient hospital care extending more than 2 midnights.                             Dispo: The patient is from: Home              Anticipated d/c is to: Home              Patient currently is not medically stable to d/c.              Difficult to place patient: No  Hannah Beat M.D on 05/10/2023 at 4:17 PM  [facility]   From 7 PM-7 AM, contact night-coverage [email or web address]  CC: Primary care physician; Eden Emms, NP

## 2023-05-10 NOTE — Progress Notes (Signed)
Title: A Phase III, randomized, double-blind, placebo controlled, multicenter study to evaluate the efficacy and safety of astegolimab in patients with chronic obstructive pulmonary disease    Dose and Duration of Treatment: Astegolimab is presented as a sterile, slightly brown-yellow solution. Each single-use, 2.25 mL pre-filled syringe contains 1.7 mL deliverable volume. Astegolimab drug product is formulated at 140 mg/mL astegolimab with 114 mM succinic acid, 200 mM L-arginine, 10 mM L-methionine, 0.06% (w/v) polysorbate 20, pH 5.7. Placebo for astegolimab is supplied in an identical pre-filled syringe configuration.   Protocol # [id number]   Sponsor: F. [employer] [address], [place]   =   Designer, fashion/clothing Product: Astegolimab [phone])   Mechanism of action: Astegolimab (also known as [id number] or [id number]) is a fully human, IgG2 monoclonal antibody that binds with high affinity to the interleukin (IL)-33 (IL-33) receptor, ST2, thereby blocking the signaling of IL-33, an inflammatory cytokine of the IL-1 family and member of the "alarmin" class of molecules. Astegolimab binds with high affinity to the human and cynomolgus monkey receptor for IL-33, ST2, and blocks IL-33 binding, thus inhibiting association with the IL-1R accessory protein (AcP) co-receptor and formation of an activated receptor complex.   Key Inclusion Criteria: Age 24-80 years at Visit 1   Documented COPD diagnosis for >=12 months prior to Visit 1 History of frequent exacerbations, defined as having had 2 or more moderate or severe COPD exacerbations within 12 months prior to Visit 1 Post-bronchodilator FEV1 >=20% and <80% of predicted at Visit 1 or Visit 2  Post-bronchodilator FEV1/FVC <0.70 at Visit 1 or Visit 2  mMRC score >=2 at screening   Current tobacco smoker or former smoker (having stopped smoking for at least 6 months prior to Visit 1) with a history of smoking >=10  pack-years On optimized COPD maintenance therapy as defined below for >=12 months prior to Visit 1            -Inhaled corticosteroid (ICS) plus long-acting beta-agonist (LABA)           -Long-acting muscarinic antagonist (LAMA) plus LABA           -ICS plus LAMA plus LABA  Key Exclusion Criteria: Current documented diagnosis of asthma  Diagnosis of a-1 antitrypsin deficiency  History of long-term treatment with oxygen at >4.0 liters/minute  Any infection that resulted in hospital admission for >=24 hours and/or treatment with oral, IV, or IM antibiotics within 4 weeks prior to Visit 1 or during screening Upper or lower respiratory tract infection within 4 weeks prior to Visit 1 or during screening Treatment with oral, IV, or IM corticosteroids (>10 mg/day prednisolone or equivalent) within 4 weeks prior to initiation of study drug Treatment with a licensed biologic agent (e.g., omalizumab, dupilumab, and/or anti-IL-5 therapies) within 3 months or 5 drug-elimination half-lives (whichever is longer) prior to screening  Planned surgical intervention during the study Known immunodeficiency, including but not limited to, HIV infection  AST, ALT, or total bilirubin elevation >=2.0 x the upper limit of normal (ULN) during screening  History of malignancy within 5 years prior to screening, with the exception of malignancies with a negligible risk of metastasis or death (e.g., 5-year overall survival rate >90%), such as adequately treated carcinoma in situ of the cervix, non-melanoma carcinoma, localized prostate cancer, or ductal carcinoma in situ Unstable cardiac disease, myocardial infarction, or New York Heart Association Class III or IV heart failure within 12 months prior to screening History or absence of an abnormal ECG  that is deemed clinically significant by the investigator, including complete left bundle branch block or second- or third-degree atrioventricular heart block     Integrated  Pharmacokinetic/Pharmacodynamic Analysis:  In Studies [id number], [id number], and [id number], exploratory biomarker analysis showed there were consistent decreases in blood eosinophil counts throughout the treatment period, potentially mediated by a direct effect of IL-33 on eosinophil progenitors. In [place], there was no significant difference in fractional exhaled nitric oxide (FeNO) levels (reflecting airway IL-4/IL-13 activity) between astegolimab-treated groups relative to placebo throughout the treatment period. These data suggest that astegolimab has only a limited effect on Type 2 inflammation in asthma. No data are applicable for Study [id number]. No data are available yet for Study [id number].  Special Warnings/Considerations: Administration of astegolimab, a protein therapeutic, may lead to the development of anti-astegolimab antibodies which could lead to AEs and/or decreased exposure. In non-clinical studies (see Section 4.2), ADA incidence has generally been low and there was no apparent ADA impact on PK and safety in these studies. To date, the immunogenicity rates observed with astegolimab in clinical studies have been relatively low as well (see Immunogenicity Section 5.6). Several clinical studies have been conducted in patients with asthma, atopic dermatitis, COPD (IIS Study TF57322) and COVID-19 severe pneumonia, which generally showed low incidence of ADAs (Table 31). There has been no correlation between ADA status and clinical findings or increased incidence of AEs. Astegolimab is now being considered in a larger study for the treatment of COPD. This patient population is typically considered to have a hyper-responsive immune system. Route of administration for this molecule will be [place], either Q2W or Q4W. These factors increase the risk of development of an immune response to astegolimab, specifically with repeat dosing. From the previous IIS Study [id number], incidence of ADAs was low in COPD  patients; this remains to be confirmed in a larger study. To monitor ADA development in ongoing studies, serum samples will be collected from patients at protocol-defined intervals. Patients who test positive for antibodies and have clinical sequelae that are considered potentially related to an ADA response may also be asked to return for additional follow-up testing.  Drug Interaction Studies: No PK drug interaction studies have been conducted to date.  Serious Adverse Reactions Observed in Asthma Study: During the treatment period, a similar proportion of patients across all cohorts experienced at least one AE, regardless of causality (Table 22). In total, 77.2%, 70.9%, 72.2%, and 72.1% of patients reported at least 1 AE in the placebo, 70 mg, 210 mg, and 490 mg groups, respectively. The most common AE's (>5% in any treatment group) were asthma, nasopharyngitis, upper respiratory tract infection, headache, and injection site reaction (ISR). The most common drug-related AE was ISR, which was reported more frequently in the astegolimab treatment groups than in the placebo group (1 [0.8%] patient in the placebo group, 10 [7.9%] patients with 70 mg, 8 [6.3%] patients with 210 mg, 6 [4.9%] patients with 490 mg). All ISRs were non-serious and mild or moderate in severity. During the treatment period, 50 SAEs were reported in 37 (7.4%) patients. The number of patients reporting SAEs was comparable across all cohorts (11 SAEs in 8 patients on placebo, 21 SAEs in 14 patients on 70 mg, 11 SAEs in 9 patients on 210 mg, and 7 SAEs in 6 patients on 490 mg). The most common SAE was asthma. One SAE of moderate livedo reticularis (70 mg) was considered a suspected unexpected serious adverse drug reaction (SUSAR) related to astegolimab and was reported  two days after the second dose, leading to discontinuation of astegolimab. Two (0.4%) patients reported anaphylaxis and hypersensitivity reactions: 1 severe SAE of  anaphylactic reaction (placebo), and 1 moderate hypersensitivity (490 mg) considered related to astegolimab. Three (0.6%) patients experienced a potential Major Adverse Cardiac Event (MACE) (1 patient each in the placebo, 70 mg, and 210 mg groups). None of the potential MACE were considered related to astegolimab. Overall, 233 (46.4%) patients reported events of infection. A comparable number of patients reported infection across all treatment groups (65 [51.2%] patients on placebo, 55 [43.3%] patients on 70 mg, 58 [46.0%] patients on 210 mg, and 55 [45.1%] patients on 490 mg). The most frequently reported infection (>=10% incidence) was nasopharyngitis (12.7%). One patient on astegolimab 210 mg and the partner of one patient on placebo became pregnant during the study. Both delivered normal/healthy babies. Two deaths, unrelated to study drug, were reported: one patient on 210 mg astegolimab died following an SAE of asthma; the other patient on 490 mg astegolimab had an unexplained death. There were no clinically meaningful changes in laboratory parameters, vital signs, or ECG results, other than the 10% decrease in mean blood eosinophil counts in the astegolimab-treated groups, with no safety concerns. Treatment-induced ADAs were comparable between the astegolimab groups and had no impact on safety. Overall, astegolimab was well tolerated at all doses used and had a safety profile consistent with that observed in the previous astegolimab Phase I studies.  Serious Adverse Reactions Observed in Previous COPD Study: In the completed IIS Study [id number], a total of 81 patients received at least one dose of astegolimab or placebo. The safety profile of astegolimab was similar to that of placebo. There were a total of 222 AEs reported in 62 patients. A total of 28 (72%) patients in the placebo arm and 34 (81%) patients in the astegolimab arm reported at least one AE. The most commonly reported AEs were headache  followed by urinary tract infection and viral upper respiratory tract infection. A total of 39 SAEs were reported in 28 (35%) patients; 16 (41%) patients in the placebo group and 12 (29%) in the astegolimab group. The most commonly reported SAE was hospital admission for community acquired pneumonia. Four SAEs resulted in patient discontinuation from treatment (1 patient in the placebo group and 3 patients in the astegolimab group). One patient in the placebo group experienced an AESI of potential MACE (heart failure, unrelated to the study treatment). No anaphylaxis or pregnancies were reported. Two deaths, unrelated to study treatment, were reported: 1 patient in the placebo group died after hospital acquired pneumonia, and another patient in the placebo group died after pneumonia and type 2 respiratory failure.  Safety Data: Astegolimab has been generally well tolerated. There have been 49 patient deaths across all astegolimab studies (Section 5.5.2), none of which were considered related to astegolimab. A total of 144 subjects experienced a total of 224 SAEs across all astegolimab studies. Of these, an SAE livedo reticularis observed in 1 patient was considered related to astegolimab by the investigator (Section 5.5.3). AEs leading to withdrawal (Section 5.5.4) have generally occurred at a rate similar to what would be expected for clinical trials in the studies' respective indications.     Xxxxxxxxxxxxxxxxxxxxxxxx   This visit for Subject Maria [name] with DOB: 1956/04/04 on 05/10/2023 for the above protocol is Visit/Encounter # week 32  and is for purpose of research . Subject/LAR expressed continued interest and consent in continuing as a study subject. Subject thanked for participation  in research and contribution to science.    S: Patient is coming for a routine week 32 visit.  This is scheduled injection visit.  Investigator involvement is not normally required for this visit but she has been  symptomatic and therefore I decided to see her on unscheduled basis.  She is tells me that ever since the diagnosis of cellulitis on April 05, 2023 she has not been feeling well and she has been progressively declining.  Review of the records at that time show she made a visit to her primary care physician's office.  She did have URI symptoms at that time and also has a rash in the lower extremities.    On 03/29/2023 she had a routine injection visit for the research study   April 05, 2023 labs did indicate a positive ANA which is a known medical history but also low complements and a trace positive ANA.  Rheumatology referral was made.  This was treated with a diagnosis of cellulitis on 04/07/2023 2 days later.  She was given a course of cephalexin for a week.  On 04/12/2023 she had another routine research injection visit.  Then on April 14, 2023 she had a screening visit for low-dose lung cancer screening CT chest  Then on 04/18/2023 she saw her family next practitioner for acute exacerbation COPD and was given a course of Augmentin and prednisone a chest x-ray was done.  Then on 10//24 she underwent low-dose CT scan of the chest [this showed right lower lobe pneumonia but the result was not available till 04/06/2023.   Then on 04/26/2023 she had another routine research injection visit.  Research coordinator reported COPD exacerbation symptoms to me the principal investigator and her primary pulmonologist.  I sent in prednisone and Z-Pak.  Basically symptoms are ongoing and not getting better  Then on 05/04/2023 she called in saying that symptoms were no better.  When I visualized the CT scan [formal results not ready till 2 days later] right lower lobe pneumonia was noted.  I sent in Levaquin and prednisone   Then today 05/10/2023 she comes in for week 32 research injection visit  -She reports that she is not getting any better.  In fact she is having significant orthopnea.   Baseline she sleeps at a 15-30 degree angle but now sleeping at almost 90 degree angle.  Her oxygen use at rest at baseline is 3 L nasal cannula currently cranking it up to 4-5 L nasal cannula based on subjective symptoms [pulse ox 98% on her 4 L at the office right now].  She is also fatigued and feeling weak.  She is coughing a lot more than [place] dry.   -She has lost weight.  In February 2024 she was 145.6 pounds at the entry into the study.  Currently she is 130 pounds according to her history     -There is no fever or edema hemoptysis or vomiting.   Labs  - She has new onset leukocytosis since August 2024 -September 2024 has ongoing positive trace ANA but also trace positive low complement levels and ENA positive [this would be considered adverse event for the study] -Procalcitonin is normal -Echo is pending for next week   A/p  - COPD exacerbation: This seems unresolved an unusually long despite multiple course of antibiotic and prednisone.  Current provisional diagnosis is COPD exacerbation with failure to improve.  I recommended admission to her.  This will be reported as a serious adverse event in the  setting of the study but unrelated to the study drug   -Other adverse events [anything new that is of clinical significance that happens in the study regardless of relationship to the study]  -Low complement levels April 05, 2023  -Positive ENA antibody April 05, 2023  -Leukocytosis August 2024 onset and ongoing  -Pneumonia April 21, 2023 on CT chest (improving on current chest x-ray)  -Weight loss  -Failure to thrive   -I do not think any of the above adverse events are related to study drug   pLAN  - Recommended admission.  Spoke to [employer] health placement coordinator through secure chat.  Dr. Kirby Crigler the hospitalist received the intake call.  They are arranging for a bed   -Suspect she might need IV antibiotic therapy or pulmonary embolism possibly needs to be  ruled out.  In addition cardiac issues might be ongoing      SIGNATURE    Dr. Kalman Shan, M.D., F.C.C.P, ACRP-CPI Pulmonary and Critical Care Medicine Research Investigator, [facility] @ [facility] Health Staff Physician, [facility] Center Director - Interstitial Lung Disease  Program  Pulmonary Fibrosis [facility] [place] Pulmonary and [facility] @ [facility] [place], [place], [address]   Pager: [phone], If no answer  OR between  19:00-7:00h: page [phone] Telephone (research): [phone]  12:22 PM 05/10/2023   12:22 PM 05/10/2023

## 2023-05-10 NOTE — Assessment & Plan Note (Signed)
-   We will continue Roflumilast.

## 2023-05-10 NOTE — Assessment & Plan Note (Signed)
-   We will continue Neurontin. ?

## 2023-05-10 NOTE — Assessment & Plan Note (Addendum)
-   The patient will be admitted to a med-surg bed. - Will obtain a two-view chest x-ray, CBC, and CMP. - We will place the patient IV steroid therapy with IV Solu-Medrol as well as nebulized bronchodilator therapy with duonebs q.i.d. and q.4 hours p.r.n.Marland Kitchen - Mucolytic therapy will be provided with Mucinex and antibiotic therapy with IV Rocephin and Zithromax. - Will obtain a sputum Gram stain culture and sensitivity given failure of outpatient antibiotic therapy and persistent productive cough.. - O2 protocol will be followed. - We will hold off long-acting beta agonist during this exacerbation.

## 2023-05-10 NOTE — Telephone Encounter (Signed)
Dr. Marchelle Gearing, I was calling my abnormal screening scans and saw that Maria [name] has been Admitted today. Her lung cancer screening scan , which was done 10/19 has been read as a LR 4 B. There is a 20 mm irregular nodular opacity in the posterior right base of her lung, associated with other peripheral irregular nodularity and posterior right lower lobe airway impaction. Features may well be infectious/inflammatory but warrant close follow-up.  Do you feel this is more likely related to infection and inflammation ( I saw in your note she has had pneumonia). Do you think a 3 month follow up Low Dose CT Chest would be more appropriate than any kind of PET or follow up imaging now?   Thanks

## 2023-05-10 NOTE — Assessment & Plan Note (Signed)
- 

## 2023-05-10 NOTE — Patient Instructions (Addendum)
ICD-10-CM   1. Research exam  Z00.6     2. COPD, frequent exacerbations (HCC)  J44.1     3. COPD with acute exacerbation (HCC)  J44.1       RECOMMENEDED ADMISSION

## 2023-05-10 NOTE — Assessment & Plan Note (Signed)
-   We will continue statin therapy. 

## 2023-05-10 NOTE — Assessment & Plan Note (Signed)
-   The patient has symptomatic carotid stenosis. - We will continue aspirin and Plavix.

## 2023-05-10 NOTE — Assessment & Plan Note (Signed)
-   We will continue PPI therapy 

## 2023-05-11 ENCOUNTER — Inpatient Hospital Stay (HOSPITAL_COMMUNITY): Payer: Medicare HMO

## 2023-05-11 DIAGNOSIS — J441 Chronic obstructive pulmonary disease with (acute) exacerbation: Secondary | ICD-10-CM | POA: Diagnosis not present

## 2023-05-11 DIAGNOSIS — K219 Gastro-esophageal reflux disease without esophagitis: Secondary | ICD-10-CM | POA: Diagnosis not present

## 2023-05-11 DIAGNOSIS — I272 Pulmonary hypertension, unspecified: Secondary | ICD-10-CM | POA: Diagnosis not present

## 2023-05-11 DIAGNOSIS — E785 Hyperlipidemia, unspecified: Secondary | ICD-10-CM | POA: Diagnosis not present

## 2023-05-11 LAB — CBC
HCT: 32.4 % — ABNORMAL LOW (ref 36.0–46.0)
Hemoglobin: 10.6 g/dL — ABNORMAL LOW (ref 12.0–15.0)
MCH: 31.8 pg (ref 26.0–34.0)
MCHC: 32.7 g/dL (ref 30.0–36.0)
MCV: 97.3 fL (ref 80.0–100.0)
Platelets: 252 10*3/uL (ref 150–400)
RBC: 3.33 MIL/uL — ABNORMAL LOW (ref 3.87–5.11)
RDW: 14.4 % (ref 11.5–15.5)
WBC: 13.1 10*3/uL — ABNORMAL HIGH (ref 4.0–10.5)
nRBC: 0 % (ref 0.0–0.2)

## 2023-05-11 LAB — RESPIRATORY PANEL BY PCR

## 2023-05-11 LAB — STREP PNEUMONIAE URINARY ANTIGEN: Strep Pneumo Urinary Antigen: NEGATIVE

## 2023-05-11 LAB — BASIC METABOLIC PANEL
Anion gap: 10 (ref 5–15)
BUN: 15 mg/dL (ref 8–23)
CO2: 26 mmol/L (ref 22–32)
Calcium: 9 mg/dL (ref 8.9–10.3)
Chloride: 102 mmol/L (ref 98–111)
Creatinine, Ser: 0.67 mg/dL (ref 0.44–1.00)
GFR, Estimated: >60 mL/min (ref >60)
Glucose, Bld: 115 mg/dL — ABNORMAL HIGH (ref 70–99)
Potassium: 4.5 mmol/L (ref 3.5–5.1)
Sodium: 138 mmol/L (ref 135–145)

## 2023-05-11 LAB — PROCALCITONIN: Procalcitonin: <0.1 ng/mL

## 2023-05-11 MED ORDER — IOHEXOL 350 MG/ML SOLN
75.0000 mL | Freq: Once | INTRAVENOUS | Status: AC | PRN
  Administered 2023-05-11: 75 mL via INTRAVENOUS

## 2023-05-11 MED ORDER — ACETYLCYSTEINE 20 % IN SOLN
2.0000 mL | Freq: Four times a day (QID) | RESPIRATORY_TRACT | Status: AC
  Administered 2023-05-11 – 2023-05-12 (×5): 2 mL via RESPIRATORY_TRACT
  Filled 2023-05-11 (×5): qty 4

## 2023-05-11 MED ORDER — IPRATROPIUM-ALBUTEROL 0.5-2.5 (3) MG/3ML IN SOLN
3.0000 mL | RESPIRATORY_TRACT | Status: DC
  Administered 2023-05-11 – 2023-05-13 (×8): 3 mL via RESPIRATORY_TRACT
  Filled 2023-05-11 (×10): qty 3

## 2023-05-11 MED ORDER — IPRATROPIUM-ALBUTEROL 0.5-2.5 (3) MG/3ML IN SOLN
3.0000 mL | Freq: Three times a day (TID) | RESPIRATORY_TRACT | Status: DC
  Administered 2023-05-11: 3 mL via RESPIRATORY_TRACT
  Filled 2023-05-11: qty 3

## 2023-05-11 MED ORDER — SIMETHICONE 80 MG PO CHEW
80.0000 mg | CHEWABLE_TABLET | Freq: Four times a day (QID) | ORAL | Status: DC | PRN
  Administered 2023-05-11: 80 mg via ORAL
  Filled 2023-05-11: qty 1

## 2023-05-11 MED ORDER — METHYLPREDNISOLONE SODIUM SUCC 40 MG IJ SOLR
40.0000 mg | Freq: Two times a day (BID) | INTRAMUSCULAR | Status: DC
  Administered 2023-05-11 – 2023-05-13 (×4): 40 mg via INTRAVENOUS
  Filled 2023-05-11 (×4): qty 1

## 2023-05-11 NOTE — Progress Notes (Signed)
PROGRESS NOTE    Maria [name]  XBM:[id number] DOB: [birth year] DOA: 05/10/2023 PCP: Eden Emms, NP   Brief Narrative:  Maria [name] is a 67 y.o. Caucasian female with medical history significant for COPD, chronic respiratory failure on home O2, on a research study for a phase 3 randomized double-blind, placebo controlled multicenter study on Astegolimab (an IL-33 binder)  Patient presents with acute exacerbation of COPD from pulmonary clinic having failed antibiotic and prednisone taper x 2.  She is not requiring increased oxygen from baseline with profound dyspnea.   Assessment & Plan:   Principal Problem:   COPD exacerbation (HCC) Active Problems:   Pulmonary hypertension (HCC)   Dyslipidemia   PVD (peripheral vascular disease) (HCC)   GERD without esophagitis   Depression   Peripheral neuropathy  Acute on chronic hypoxic respiratory failure - acute COPD exacerbation (HCC) -Well-known to the pulmonology team, acute on chronic COPD exacerbation with failure of outpatient antibiotic and steroid taper -PE study negative for filling defect with extensive mucous plugging of the bilateral lower lung fields concerning for aspiration. -Irregular solid nodule of the right lower lobe -Pulmonology consulted, appreciate insight recommendations -Continue steroid taper, nebulizers, mucolytic's and antibiotics -Cultures remain negative, likely secondary to prior antibiotic courses -Continue to wean oxygen as appropriate - baseline appears to be 3L at rest 5L with exertion - this will be our goal for weaning -We will hold off long-acting beta agonist during this exacerbation.    Pulmonary nodule -Pulmonology following as above- unclear if this is amenable to biopsy given its posterior position near the liver, she is likely at high risk patient given her baseline respiratory status though -14*92mm - RLL just superior to the diaphragm  Pulmonary hypertension (HCC) -We will continue  Roflumilast.   Dyslipidemia - We will continue statin therapy.   PVD (peripheral vascular disease) (HCC) - The patient has symptomatic carotid stenosis. - We will continue aspirin and Plavix.   Peripheral neuropathy - We will continue Neurontin.   Depression - We will continue Lexapro.   GERD without esophagitis - We will continue PPI therapy.  DVT prophylaxis: enoxaparin (LOVENOX) injection 40 mg Start: 05/10/23 2200   Code Status:   Code Status: Full Code  Family Communication: None present  Status is: Inpatient  Dispo: The patient is from: Home              Anticipated d/c is to: To be determined              Anticipated d/c date is: 48 to 72 hours              Patient currently not medically stable for discharge  Consultants:  Pulmonology  Procedures:  None planned  Antimicrobials:  Azithromycin, ceftriaxone  Subjective: No acute issues or events overnight, respiratory status appears to be unchanged from prior, no worse yet no better.  She denies overt chest pain nausea vomiting diarrhea constipation headache fevers or chills.  Objective: Vitals:   05/10/23 2032 05/10/23 2331 05/11/23 0414 05/11/23 0730  BP: (!) 139/52 (!) 117/41 93/60   Pulse: 71 (!) 58 (!) 55   Resp: 16 16 16    Temp: 98 F (36.7 C) 98 F (36.7 C) 98 F (36.7 C)   TempSrc: Oral Oral Oral   SpO2: 99% 100% 100% 99%  Weight:        Intake/Output Summary (Last 24 hours) at 05/11/2023 0749 Last data filed at 05/11/2023 0304 Gross per 24 hour  Intake  679.82 ml  Output --  Net 679.82 ml   Filed Weights   05/10/23 1535  Weight: 61.2 kg    Examination:  General:  Pleasantly resting in bed, No acute distress. HEENT:  Normocephalic atraumatic.  Sclerae nonicteric, noninjected.  Extraocular movements intact bilaterally. Neck:  Without mass or deformity.  Trachea is midline. Lungs: Diffuse loud expiratory rhonchi likely related to upper airway noises without overt rales or  wheeze. Heart:  Regular rate and rhythm.  Without murmurs, rubs, or gallops. Abdomen:  Soft, nontender, nondistended.  Without guarding or rebound. Extremities: Without cyanosis, clubbing, edema, or obvious deformity. Skin:  Warm and dry, no erythema.  Data Reviewed: I have personally reviewed following labs and imaging studies  CBC: Recent Labs  Lab 05/10/23 1640 05/11/23 0529  WBC 13.1* 13.1*  NEUTROABS 9.1*  --   HGB 11.9* 10.6*  HCT 36.4 32.4*  MCV 97.1 97.3  PLT 300 252   Basic Metabolic Panel: Recent Labs  Lab 05/10/23 1640 05/11/23 0529  NA 140 138  K 3.5 4.5  CL 106 102  CO2 26 26  GLUCOSE 118* 115*  BUN 18 15  CREATININE 0.74 0.67  CALCIUM 8.9 9.0   GFR: Estimated Creatinine Clearance: 63.6 mL/min (by C-G formula based on SCr of 0.67 mg/dL). Liver Function Tests: Recent Labs  Lab 05/10/23 1640  AST 16  ALT 18  ALKPHOS 64  BILITOT 0.5  PROT 6.7  ALBUMIN 3.7   Recent Results (from the past 240 hour(s))  Expectorated Sputum Assessment w Gram Stain, Rflx to Resp Cult     Status: None   Collection Time: 05/10/23  5:36 PM   Specimen: Expectorated Sputum  Result Value Ref Range Status   Specimen Description EXPECTORATED SPUTUM  Final   Special Requests NONE  Final   Sputum evaluation   Final    THIS SPECIMEN IS ACCEPTABLE FOR SPUTUM CULTURE Performed at [facility], [address], [place], [place] [address]    Report Status 05/10/2023 FINAL  Final  Culture, Respiratory w Gram Stain     Status: None (Preliminary result)   Collection Time: 05/10/23  5:36 PM  Result Value Ref Range Status   Specimen Description   Final    EXPECTORATED SPUTUM Performed at [facility], [address], [place], [place] [address]    Special Requests   Final    NONE Reflexed from [id number] Performed at [facility], [address], [place], [place] [address]    Gram Stain   Final    RARE WBC PRESENT,BOTH PMN AND  MONONUCLEAR FEW GRAM POSITIVE COCCI FEW YEAST WITH PSEUDOHYPHAE Performed at [facility], [address] [place], [place] [address]    Culture PENDING  Incomplete   Report Status PENDING  Incomplete         Radiology Studies: DG Chest 2 View  Result Date: 05/10/2023 CLINICAL DATA:  COPD exacerbation. EXAM: CHEST - 2 VIEW COMPARISON:  Radiograph 05/01/2023, CT 04/21/2023 FINDINGS: Chronic hyperinflation, emphysema and bronchial thickening. Resolved opacity at the right lung base. No new airspace disease. No pulmonary edema, pleural effusion, or pneumothorax. No acute osseous findings. IMPRESSION: 1. Resolved opacity at the right lung base. No new airspace disease. 2. Chronic hyperinflation, emphysema and bronchial thickening. Electronically Signed   By: Narda Rutherford M.D.   On: 05/10/2023 20:48        Scheduled Meds:  aspirin EC  81 mg Oral Daily   atorvastatin  10 mg Oral Daily   clopidogrel  75 mg Oral Daily   enoxaparin (LOVENOX) injection  40 mg Subcutaneous Q24H   escitalopram  20 mg Oral Daily   guaiFENesin  600 mg Oral BID   ipratropium-albuterol  3 mL Nebulization QID   midodrine  10 mg Oral BID WC   oxybutynin  10 mg Oral Daily   pantoprazole  40 mg Oral Daily   predniSONE  40 mg Oral Q breakfast   roflumilast  500 mcg Oral Daily   sodium chloride HYPERTONIC  4 mL Nebulization BID   Continuous Infusions:  sodium chloride 40 mL/hr at 05/11/23 0304   azithromycin 500 mg (05/10/23 1814)   cefTRIAXone (ROCEPHIN)  IV 1 g (05/10/23 1721)     LOS: 1 day    Time spent:    Azucena Fallen, DO [facility]  If 7PM-7AM, please contact night-coverage [email or web address]  05/11/2023, 7:49 AM

## 2023-05-11 NOTE — Telephone Encounter (Signed)
At the time I called pneumonia the official CT report had not resulted.  But she wassick of the 2 and 2 together then called pneumonia.  She did have a CT right now in the hospital.  You could take a look at it.  Currently it looks more nodular for me and I agree it is more likely a nodule the pneumonia.  I think now we have 2 CT scans in the same month.  If you think we can get a PET scan in a couple of months or 6 weeks as an outpatient.   CT Angio Chest Pulmonary Embolism (PE) W or WO Contrast  Result Date: 05/11/2023 CLINICAL DATA:  COPD exacerbation EXAM: CT ANGIOGRAPHY CHEST WITH CONTRAST TECHNIQUE: Multidetector CT imaging of the chest was performed using the standard protocol during bolus administration of intravenous contrast. Multiplanar CT image reconstructions and MIPs were obtained to evaluate the vascular anatomy. RADIATION DOSE REDUCTION: This exam was performed according to the departmental dose-optimization program which includes automated exposure control, adjustment of the mA and/or kV according to patient size and/or use of iterative reconstruction technique. CONTRAST:  75mL OMNIPAQUE IOHEXOL 350 MG/ML SOLN COMPARISON:  Lung cancer screening CT dated April 21, 2023 FINDINGS: Cardiovascular: No evidence of pulmonary embolus. Normal heart size. No pericardial effusion. Normal caliber aorta with moderate atherosclerotic disease. Mediastinum/Nodes: Esophagus and thyroid are unremarkable. No enlarged lymph nodes seen in the chest. Lungs/Pleura: Central airways are patent. Severe emphysema. Mucous plugging of the bilateral lower lobes and bronchial wall thickening. Irregular solid nodule of the right lower lobe measuring 1.6 x 1.5 cm on series 4, image 121, unchanged in size when compared with recent lung cancer screening CT. No pleural effusion or pneumothorax. Upper Abdomen: No acute abnormality. Musculoskeletal: No chest wall abnormality. No acute or significant osseous findings. Review of  the MIP images confirms the above findings. IMPRESSION: 1. No evidence of pulmonary embolus. 2. Extensive mucous plugging of the bilateral lower lobes, likely due to aspiration. 3. Irregular solid nodule of the right lower lobe, unchanged when compared with the recent lung cancer screening CT and suspicious for primary lung malignancy. 4. Moderate coronary artery calcifications, aortic Atherosclerosis (ICD10-I70.0) and Emphysema (ICD10-J43.9). Electronically Signed   By: Allegra Lai M.D.   On: 05/11/2023 13:40   DG Chest 2 View  Result Date: 05/10/2023 CLINICAL DATA:  COPD exacerbation. EXAM: CHEST - 2 VIEW COMPARISON:  Radiograph 05/01/2023, CT 04/21/2023 FINDINGS: Chronic hyperinflation, emphysema and bronchial thickening. Resolved opacity at the right lung base. No new airspace disease. No pulmonary edema, pleural effusion, or pneumothorax. No acute osseous findings. IMPRESSION: 1. Resolved opacity at the right lung base. No new airspace disease. 2. Chronic hyperinflation, emphysema and bronchial thickening. Electronically Signed   By: Narda Rutherford M.D.   On: 05/10/2023 20:48

## 2023-05-11 NOTE — Consult Note (Addendum)
NAME:  Maria [name], MRN:  [id number], DOB:  05-27-1956, LOS: 1 ADMISSION DATE:  05/10/2023, CONSULTATION DATE:  05/11/23 REFERRING MD:  Dr Natale Milch, CHIEF COMPLAINT:  COPD exac    History of Present Illness:   Referred fro pulmonary research yesterday. She presented yesterday 05/10/23 for Week 32 of injections AStegolimab v Placebo (COOPD study)  and history noted was  Patient is coming for a routine week 32 visit.  This is scheduled injection visit.  Investigator involvement is not normally required for this visit but she has been symptomatic and therefore I decided to see her on unscheduled basis.  She is tells me that ever since the diagnosis of cellulitis on April 05, 2023 she has not been feeling well and she has been progressively declining.  Review of the records at that time show she made a visit to her primary care physician's office.  She did have URI symptoms at that time and also has a rash in the lower extremities.      On 03/29/2023 she had a routine injection visit for the research study. April 05, 2023 labs did indicate a positive ANA which is a known medical history but also low complements and a trace positive ANA.  Rheumatology referral was made.   This was treated with a diagnosis of cellulitis on 04/07/2023 2 days later.  She was given a course of cephalexin for a week.   On 04/12/2023 she had another routine research injection visit.   Then on April 14, 2023 she had a screening visit for low-dose lung cancer screening CT chest   Then on 04/18/2023 she saw her family next practitioner for acute exacerbation COPD and was given a course of Augmentin and prednisone a chest x-ray was done.   Then on 04/21/23 she underwent low-dose CT scan of the chest [this showed right lower lobe Nodule +/-0 pneumonia inmo but the result was not available till 05/06/2023.   Then on 04/26/2023 she had another routine research injection visit.  Research coordinator reported COPD  exacerbation symptoms to me the principal investigator and her primary pulmonologist.  I sent in prednisone and Z-Pak.  Basically symptoms are ongoing and not getting better.  Then on 05/04/2023 she called in saying that symptoms were no better.  When I visualized the CT scan [formal results not ready till 2 days later] I thouht right lower lobe pneumonia was noted in the clinical contex (radiology called it nodule +/- surround inflammation) .  I sent in Levaquin and prednisone.      Then 05/10/2023 she comes in for week 32 research injection visit             -She reports that she is not getting any better.  In fact she is having significant orthopnea.  Baseline she sleeps at a 15-30 degree angle but now sleeping at almost 90 degree angle.  Her oxygen use at rest at baseline is 3 L nasal cannula currently cranking it up to 4-5 L nasal cannula based on subjective symptoms [pulse ox 98% on her 4 L at the office right now].  She is also fatigued and feeling weak.  She is coughing a lot more than Mostly dry. She has lost weight.  In February 2024 she was 145.6 pounds at the entry into the study.  Currently she is 130 pounds according to her history .  There is no fever or edema hemoptysis or vomiting.     Labs  - She has new onset  leukocytosis since August 2024 -September 2024 has ongoing positive trace ANA but also trace positive low complement levels and ENA positive [this would be considered adverse event for the study] -Procalcitonin is normal -Echo is pending for next week    Admitted to the hospital by the hospitalist service 05/10/2023 and started on IV steroids and also Antibiotics.  CT angiogram ruled out pulmonary embolism but shows mucous plugging in the lower lobes.  In addition the right lower lobe density is more described as a nodule and is persistent.  Radiologist concerned about early stage lung cancer.  Patient is beginning to feel better.  She says she is able to recline a little bit  more.  Daughter at the bedside.  She is better on 05/11/2023.  But in the context of the overall illness pulmonary medicine called.     Past Medical History:    has a past medical history of Atherosclerosis (1/0612014), Blood transfusion without reported diagnosis (2005), Cancer (HCC) (Ovarian 1978), Chest pain, COPD (chronic obstructive pulmonary disease) (HCC), COVID-19 long hauler manifesting chronic dyspnea, Emphysema of lung (HCC) (2019), Oxygen deficiency (03/2019), and Oxygen dependent.   reports that she quit smoking about 7 years ago. Her smoking use included cigarettes. She started smoking about 52 years ago. She has a 45 pack-year smoking history. She has been exposed to tobacco smoke. She has never used smokeless tobacco.  Past Surgical History:  Procedure Laterality Date   ABDOMINAL HYSTERECTOMY  1978   BREAST BIOPSY Left 04/04/2023   Stereo bx, X clip, path pending   BREAST BIOPSY Left 04/04/2023   MM LT BREAST BX W LOC DEV 1ST LESION IMAGE BX SPEC STEREO GUIDE 04/04/2023 [facility]   CAROTID PTA/STENT INTERVENTION Left 03/07/2023   Procedure: CAROTID PTA/STENT INTERVENTION;  Surgeon: Renford Dills, MD;  Location: [facility] INVASIVE CV LAB;  Service: Cardiovascular;  Laterality: Left;   CHOLECYSTECTOMY  2010   COLONOSCOPY     COLONOSCOPY     COLONOSCOPY WITH PROPOFOL N/A 11/01/2021   Procedure: COLONOSCOPY WITH PROPOFOL;  Surgeon: Wyline Mood, MD;  Location[facility] ENDOSCOPY;  Service: Gastroenterology;  Laterality: N/A;   ESOPHAGOGASTRODUODENOSCOPY     NECK SURGERY  1999   OVARIAN CYST REMOVAL  1988   SPINE SURGERY  Neck 2002    Allergies  Allergen Reactions   Montelukast     Bad dreams    Immunization History  Administered Date(s) Administered   Fluad Quad(high Dose 65+) 03/30/2022   Influenza, High Dose Seasonal PF 03/30/2022, 03/27/2023   Influenza, Seasonal, Injecte, Preservative Fre 04/28/2008, 04/09/2010   Influenza,inj,Quad PF,6+ Mos 04/22/2019,  04/25/2020, 03/15/2021   Influenza-Unspecified 06/01/2008, 05/10/2016, 03/18/2017   Moderna Sars-Covid-2 Vaccination 07/30/2019, 08/28/2019, 06/27/2020   PNEUMOCOCCAL CONJUGATE-20 07/06/2022   Pfizer Covid-19 Vaccine Bivalent Booster 40yrs & up 07/28/2021   Pneumococcal Polysaccharide-23 04/15/2015, 04/22/2020   RSV,unspecified 07/06/2022   Respiratory Syncytial Virus Vaccine,Recomb Aduvanted(Arexvy) 05/30/2022   Td 10/01/2020   Tdap 11/17/2009, 03/27/2023   Zoster Recombinant(Shingrix) 04/16/2020, 06/22/2020    Family History  Problem Relation Age of Onset   Heart failure Mother    Diabetes Mother    Kidney disease Mother    Hypertension Mother    Arthritis Mother    Heart disease Mother    Obesity Mother    COPD Father    Hearing loss Father    Lung cancer Brother    COPD Brother    COPD Brother    Asthma Son    Diabetes Son    Birth  defects Son    Breast cancer Maternal Aunt 40     Current Facility-Administered Medications:    acetaminophen (TYLENOL) tablet 650 mg, 650 mg, Oral, Q6H PRN **OR** acetaminophen (TYLENOL) suppository 650 mg, 650 mg, Rectal, Q6H PRN, Mansy, Jan A, MD   aspirin EC tablet 81 mg, 81 mg, Oral, Daily, Mansy, Jan A, MD, 81 mg at 05/11/23 1149   atorvastatin (LIPITOR) tablet 10 mg, 10 mg, Oral, Daily, Mansy, Jan A, MD, 10 mg at 05/11/23 1149   azithromycin (ZITHROMAX) 500 mg in sodium chloride 0.9 % 250 mL IVPB, 500 mg, Intravenous, Q24H, Mansy, Jan A, MD, Last Rate: 250 mL/hr at 05/10/23 1814, 500 mg at 05/10/23 1814   cefTRIAXone (ROCEPHIN) 1 g in sodium chloride 0.9 % 100 mL IVPB, 1 g, Intravenous, Q24H, Mansy, Jan A, MD, Last Rate: 200 mL/hr at 05/10/23 1721, 1 g at 05/10/23 1721   chlorpheniramine-HYDROcodone (TUSSIONEX) 10-8 MG/5ML suspension 5 mL, 5 mL, Oral, Q12H PRN, Mansy, Jan A, MD   clopidogrel (PLAVIX) tablet 75 mg, 75 mg, Oral, Daily, Mansy, Jan A, MD, 75 mg at 05/11/23 1145   enoxaparin (LOVENOX) injection 40 mg, 40 mg, Subcutaneous,  Q24H, Mansy, Jan A, MD, 40 mg at 05/10/23 2158   escitalopram (LEXAPRO) tablet 20 mg, 20 mg, Oral, Daily, Mansy, Jan A, MD, 20 mg at 05/11/23 1146   guaiFENesin (MUCINEX) 12 hr tablet 600 mg, 600 mg, Oral, BID, Mansy, Jan A, MD, 600 mg at 05/11/23 1146   hydrOXYzine (ATARAX) tablet 25 mg, 25 mg, Oral, QHS PRN, Mansy, Jan A, MD   ipratropium-albuterol (DUONEB) 0.5-2.5 (3) MG/3ML nebulizer solution 3 mL, 3 mL, Nebulization, TID, Azucena Fallen, MD, 3 mL at 05/11/23 1334   magnesium hydroxide (MILK OF MAGNESIA) suspension 30 mL, 30 mL, Oral, Daily PRN, Mansy, Jan A, MD   midodrine (PROAMATINE) tablet 10 mg, 10 mg, Oral, BID WC, Mansy, Jan A, MD, 10 mg at 05/11/23 1145   ondansetron (ZOFRAN) tablet 4 mg, 4 mg, Oral, Q6H PRN **OR** ondansetron (ZOFRAN) injection 4 mg, 4 mg, Intravenous, Q6H PRN, Mansy, Jan A, MD   oxybutynin (DITROPAN-XL) 24 hr tablet 10 mg, 10 mg, Oral, Daily, Mansy, Jan A, MD, 10 mg at 05/11/23 1149   pantoprazole (PROTONIX) EC tablet 40 mg, 40 mg, Oral, Daily, Mansy, Jan A, MD, 40 mg at 05/11/23 1146   [COMPLETED] methylPREDNISolone sodium succinate (SOLU-MEDROL) 40 mg/mL injection 40 mg, 40 mg, Intravenous, Q12H, 40 mg at 05/11/23 0459 **FOLLOWED BY** predniSONE (DELTASONE) tablet 40 mg, 40 mg, Oral, Q breakfast, Mansy, Jan A, MD   roflumilast (DALIRESP) tablet 500 mcg, 500 mcg, Oral, Daily, Mansy, Jan A, MD, 500 mcg at 05/11/23 1145   sodium chloride HYPERTONIC 3 % nebulizer solution 4 mL, 4 mL, Nebulization, BID, Mansy, Jan A, MD, 4 mL at 05/11/23 0729   traZODone (DESYREL) tablet 25 mg, 25 mg, Oral, QHS PRN, Mansy, Jan A, MD, 25 mg at 05/10/23 2158     Significant Hospital Events:  05/10/2023 - admit   05/11/2023 - seen in bed 1607  Objective   Blood pressure 93/60, pulse (!) 55, temperature 98 F (36.7 C), temperature source Oral, resp. rate 16, weight 61.2 kg, SpO2 97%.        Intake/Output Summary (Last 24 hours) at 05/11/2023 1507 Last data filed at  05/11/2023 1314 Gross per 24 hour  Intake 1399.82 ml  Output --  Net 1399.82 ml   Filed Weights   05/10/23 1535  Weight: 61.2 kg  Examination: General: She is looking better.  She is sitting at a 45 degree angle HENT: Not as labored as yesterday and not coughing as much.  Is on oxygen Lungs: Clear to auscultation Cardiovascular: Normal heart sounds  Abdomen: Soft, nontender no organomegaly Extremities: Intact Neuro: Alert and oriented x 3 speech normal.  Moves all 4 extremities GU: Not examined.  Resolved Hospital Problem list   x  Assessment & Plan:  ASSESSMENT / PLAN:  PULMONARY  A:  Advanced COPD with chronic respiratory failure presents with what looks like a unresolved exacerbation despite multiple course of antibiotics) known in the outpatient setting  Right lower lobe nodular density 04/21/2023 new onset and CT scan, treated with outpatient antibiotic therapy but persist on admission CT 05/07/2023  Pulmonary embolism ruled out  Significant mucous plugging present.  05/11/2023 -> subjectively better after starting IV steroids  P:   Check respiratory virus panel, urine strep, urine Legionella and repeat procalcitonin  -Continue Antibiotics for the moment Check BNP, troponin and echo  Continue steroids but maintain IV therapy at 40 mg Solu-Medrol twice daily Continue baseline 3 mL hypertonic saline nebulizer for mucus clearance Start 36-hour Mucomyst Continue scheduled bronchodilatation DuoNeb but increase it to every 4 hours Continue Roflumilast Consier NEW FDA approved medicine ORTHOVAYRE as opd Hold study drug for the moment - likely okl for next cycle  Address nodule with outpatient PET scan in 6 weeks [probably very high risk for biopsy and if early stage lung cancer suspected would need empiric radiation therapy]  Might likely benefit from rehab/physical therapy evaluation     Best practice (daily eval):  According to the hospitalist  Goals of  Care:   M daughter and patient updated the bedside Anticopated discharge < 5 days  SIGNATURE    Dr. Kalman Shan, M.D., F.C.C.P,  Pulmonary and Critical Care Medicine Staff Physician, [facility] Director - Interstitial Lung Disease  Program  Pulmonary Fibrosis [facility] at [facility] [place], [place], [address]  NPI Number:  NPI #[id number]  Pager: [phone], If no answer  -> Check AMION or Try [phone] Telephone (clinical office): [phone] Telephone (research): [phone]  3:07 PM 05/11/2023   05/11/2023 3:07 PM    LABS    PULMONARY No results for input(s): "PHART", "PCO2ART", "PO2ART", "HCO3", "TCO2", "O2SAT" in the last 168 hours.  Invalid input(s): "PCO2", "PO2"  CBC Recent Labs  Lab 05/10/23 1640 05/11/23 0529  HGB 11.9* 10.6*  HCT 36.4 32.4*  WBC 13.1* 13.1*  PLT 300 252    COAGULATION No results for input(s): "INR" in the last 168 hours.  CARDIAC  No results for input(s): "TROPONINI" in the last 168 hours. No results for input(s): "PROBNP" in the last 168 hours.  CHEMISTRY Recent Labs  Lab 05/10/23 1640 05/11/23 0529  NA 140 138  K 3.5 4.5  CL 106 102  CO2 26 26  GLUCOSE 118* 115*  BUN 18 15  CREATININE 0.74 0.67  CALCIUM 8.9 9.0   Estimated Creatinine Clearance: 63.6 mL/min (by C-G formula based on SCr of 0.67 mg/dL).   LIVER Recent Labs  Lab 05/10/23 1640  AST 16  ALT 18  ALKPHOS 64  BILITOT 0.5  PROT 6.7  ALBUMIN 3.7     INFECTIOUS No results for input(s): "LATICACIDVEN", "PROCALCITON" in the last 168 hours.   ENDOCRINE CBG (last 3)  No results for input(s): "GLUCAP" in the last 72 hours.  IMAGING x48h  - image(s) personally visualized  -   highlighted in bold CT Angio Chest Pulmonary Embolism (PE) W or WO Contrast  Result Date: 05/11/2023 CLINICAL DATA:  COPD exacerbation EXAM: CT ANGIOGRAPHY CHEST WITH CONTRAST TECHNIQUE: Multidetector CT  imaging of the chest was performed using the standard protocol during bolus administration of intravenous contrast. Multiplanar CT image reconstructions and MIPs were obtained to evaluate the vascular anatomy. RADIATION DOSE REDUCTION: This exam was performed according to the departmental dose-optimization program which includes automated exposure control, adjustment of the mA and/or kV according to patient size and/or use of iterative reconstruction technique. CONTRAST:  75mL OMNIPAQUE IOHEXOL 350 MG/ML SOLN COMPARISON:  Lung cancer screening CT dated April 21, 2023 FINDINGS: Cardiovascular: No evidence of pulmonary embolus. Normal heart size. No pericardial effusion. Normal caliber aorta with moderate atherosclerotic disease. Mediastinum/Nodes: Esophagus and thyroid are unremarkable. No enlarged lymph nodes seen in the chest. Lungs/Pleura: Central airways are patent. Severe emphysema. Mucous plugging of the bilateral lower lobes and bronchial wall thickening. Irregular solid nodule of the right lower lobe measuring 1.6 x 1.5 cm on series 4, image 121, unchanged in size when compared with recent lung cancer screening CT. No pleural effusion or pneumothorax. Upper Abdomen: No acute abnormality. Musculoskeletal: No chest wall abnormality. No acute or significant osseous findings. Review of the MIP images confirms the above findings. IMPRESSION: 1. No evidence of pulmonary embolus. 2. Extensive mucous plugging of the bilateral lower lobes, likely due to aspiration. 3. Irregular solid nodule of the right lower lobe, unchanged when compared with the recent lung cancer screening CT and suspicious for primary lung malignancy. 4. Moderate coronary artery calcifications, aortic Atherosclerosis (ICD10-I70.0) and Emphysema (ICD10-J43.9). Electronically Signed   By: Allegra Lai M.D.   On: 05/11/2023 13:40   DG Chest 2 View  Result Date: 05/10/2023 CLINICAL DATA:  COPD exacerbation. EXAM: CHEST - 2 VIEW COMPARISON:   Radiograph 05/01/2023, CT 04/21/2023 FINDINGS: Chronic hyperinflation, emphysema and bronchial thickening. Resolved opacity at the right lung base. No new airspace disease. No pulmonary edema, pleural effusion, or pneumothorax. No acute osseous findings. IMPRESSION: 1. Resolved opacity at the right lung base. No new airspace disease. 2. Chronic hyperinflation, emphysema and bronchial thickening. Electronically Signed   By: Narda Rutherford M.D.   On: 05/10/2023 20:48

## 2023-05-11 NOTE — Plan of Care (Signed)

## 2023-05-12 ENCOUNTER — Telehealth: Payer: Self-pay | Admitting: Acute Care

## 2023-05-12 ENCOUNTER — Inpatient Hospital Stay (HOSPITAL_COMMUNITY): Payer: Medicare HMO

## 2023-05-12 DIAGNOSIS — R0603 Acute respiratory distress: Secondary | ICD-10-CM | POA: Diagnosis not present

## 2023-05-12 DIAGNOSIS — J441 Chronic obstructive pulmonary disease with (acute) exacerbation: Secondary | ICD-10-CM | POA: Diagnosis not present

## 2023-05-12 LAB — COMPREHENSIVE METABOLIC PANEL
ALT: 20 U/L (ref 0–44)
AST: 15 U/L (ref 15–41)
Albumin: 3.1 g/dL — ABNORMAL LOW (ref 3.5–5.0)
Alkaline Phosphatase: 45 U/L (ref 38–126)
Anion gap: 10 (ref 5–15)
BUN: 17 mg/dL (ref 8–23)
CO2: 24 mmol/L (ref 22–32)
Calcium: 9 mg/dL (ref 8.9–10.3)
Chloride: 101 mmol/L (ref 98–111)
Creatinine, Ser: 0.63 mg/dL (ref 0.44–1.00)
GFR, Estimated: >60 mL/min (ref >60)
Glucose, Bld: 95 mg/dL (ref 70–99)
Potassium: 3.9 mmol/L (ref 3.5–5.1)
Sodium: 135 mmol/L (ref 135–145)
Total Bilirubin: 0.7 mg/dL (ref 0.3–1.2)
Total Protein: 5.9 g/dL — ABNORMAL LOW (ref 6.5–8.1)

## 2023-05-12 LAB — ECHOCARDIOGRAM COMPLETE
Area-P 1/2: 3.17 cm2
Calc EF: 82.2 %
MV VTI: 2.16 cm2
S' Lateral: 2.2 cm
Single Plane A2C EF: 82.9 %
Single Plane A4C EF: 82.3 %
Weight: 2160 [oz_av]

## 2023-05-12 LAB — CBC
HCT: 32.2 % — ABNORMAL LOW (ref 36.0–46.0)
Hemoglobin: 10.6 g/dL — ABNORMAL LOW (ref 12.0–15.0)
MCH: 31.6 pg (ref 26.0–34.0)
MCHC: 32.9 g/dL (ref 30.0–36.0)
MCV: 96.1 fL (ref 80.0–100.0)
Platelets: 256 10*3/uL (ref 150–400)
RBC: 3.35 MIL/uL — ABNORMAL LOW (ref 3.87–5.11)
RDW: 14.4 % (ref 11.5–15.5)
WBC: 15.2 10*3/uL — ABNORMAL HIGH (ref 4.0–10.5)
nRBC: 0 % (ref 0.0–0.2)

## 2023-05-12 LAB — BRAIN NATRIURETIC PEPTIDE: B Natriuretic Peptide: 173 pg/mL — ABNORMAL HIGH (ref 0.0–100.0)

## 2023-05-12 LAB — TROPONIN I (HIGH SENSITIVITY): Troponin I (High Sensitivity): 5 ng/L (ref <18)

## 2023-05-12 MED ORDER — POTASSIUM CHLORIDE CRYS ER 20 MEQ PO TBCR
20.0000 meq | EXTENDED_RELEASE_TABLET | Freq: Once | ORAL | Status: AC
  Administered 2023-05-12: 20 meq via ORAL
  Filled 2023-05-12: qty 1

## 2023-05-12 MED ORDER — FUROSEMIDE 10 MG/ML IJ SOLN
20.0000 mg | Freq: Once | INTRAMUSCULAR | Status: AC
  Administered 2023-05-12: 20 mg via INTRAVENOUS
  Filled 2023-05-12: qty 2

## 2023-05-12 MED ORDER — AZITHROMYCIN 250 MG PO TABS
250.0000 mg | ORAL_TABLET | Freq: Every day | ORAL | Status: DC
  Administered 2023-05-12 – 2023-05-13 (×2): 250 mg via ORAL
  Filled 2023-05-12 (×2): qty 1

## 2023-05-12 NOTE — Progress Notes (Signed)
  Echocardiogram 2D Echocardiogram has been performed.  Janalyn Harder 05/12/2023, 3:14 PM

## 2023-05-12 NOTE — Telephone Encounter (Signed)
Pt. Remains in the hospital. Lets put her on the tickle list and we will call gher next week. We will do a PET scan as an OP in 6 weeks. I will call and speak with her prior to ordering. Ladies, please remind me to call her next week once she has been discharged. Thanks so much

## 2023-05-12 NOTE — Progress Notes (Signed)
Mobility Specialist - Progress Note   05/12/23 1141  Mobility  Activity Ambulated with assistance in hallway  Level of Assistance Modified independent, requires aide device or extra time  Assistive Device Front wheel walker  Distance Ambulated (ft) 460 ft  Activity Response Tolerated well  Mobility Referral Yes  $Mobility charge 1 Mobility  Mobility Specialist Start Time (ACUTE ONLY) 1127  Mobility Specialist Stop Time (ACUTE ONLY) 1140  Mobility Specialist Time Calculation (min) (ACUTE ONLY) 13 min   Pt received in recliner and agreeable to mobility. No complaints during session. Pt to EOB after session with all needs met.    [facility]

## 2023-05-12 NOTE — Plan of Care (Signed)
  Problem: Education: Goal: Knowledge of General Education information will improve Description: Including pain rating scale, medication(s)/side effects and non-pharmacologic comfort measures 05/12/2023 0507 by Elbert Ewings, RN Outcome: Progressing 05/12/2023 0507 by Elbert Ewings, RN Outcome: Progressing   Problem: Health Behavior/Discharge Planning: Goal: Ability to manage health-related needs will improve 05/12/2023 0507 by Elbert Ewings, RN Outcome: Progressing 05/12/2023 0507 by Elbert Ewings, RN Outcome: Progressing   Problem: Clinical Measurements: Goal: Ability to maintain clinical measurements within normal limits will improve Outcome: Progressing Goal: Will remain free from infection Outcome: Progressing Goal: Diagnostic test results will improve Outcome: Progressing Goal: Respiratory complications will improve Outcome: Progressing Goal: Cardiovascular complication will be avoided Outcome: Progressing   Problem: Activity: Goal: Risk for activity intolerance will decrease Outcome: Progressing   Problem: Nutrition: Goal: Adequate nutrition will be maintained Outcome: Progressing   Problem: Coping: Goal: Level of anxiety will decrease Outcome: Progressing   Problem: Elimination: Goal: Will not experience complications related to bowel motility Outcome: Progressing Goal: Will not experience complications related to urinary retention Outcome: Progressing   Problem: Pain Management: Goal: General experience of comfort will improve Outcome: Progressing   Problem: Safety: Goal: Ability to remain free from injury will improve Outcome: Progressing   Problem: Skin Integrity: Goal: Risk for impaired skin integrity will decrease Outcome: Progressing   Problem: Education: Goal: Knowledge of disease or condition will improve Outcome: Progressing Goal: Knowledge of the prescribed therapeutic regimen will improve Outcome: Progressing Goal:  Individualized Educational Video(s) Outcome: Progressing   Problem: Activity: Goal: Ability to tolerate increased activity will improve Outcome: Progressing Goal: Will verbalize the importance of balancing activity with adequate rest periods Outcome: Progressing   Problem: Respiratory: Goal: Ability to maintain a clear airway will improve Outcome: Progressing Goal: Levels of oxygenation will improve Outcome: Progressing Goal: Ability to maintain adequate ventilation will improve Outcome: Progressing

## 2023-05-12 NOTE — Progress Notes (Signed)
PROGRESS NOTE    Maria [name]  ZOX:[id number] DOB: Jan 30, 1956 DOA: 05/10/2023 PCP: Eden Emms, NP   Brief Narrative:  Maria [name] is a 67 y.o. Caucasian female with medical history significant for COPD, chronic respiratory failure on home O2, on a research study for a phase 3 randomized double-blind, placebo controlled multicenter study on Astegolimab (an IL-33 binder)  Patient presents with acute exacerbation of COPD from pulmonary clinic having failed antibiotic and prednisone taper x 2.  She is not requiring increased oxygen from baseline with profound dyspnea.   Assessment & Plan:   Principal Problem:   COPD exacerbation (HCC) Active Problems:   Pulmonary hypertension (HCC)   Dyslipidemia   PVD (peripheral vascular disease) (HCC)   GERD without esophagitis   Depression   Peripheral neuropathy  Acute on chronic hypoxic respiratory failure - acute COPD exacerbation (HCC) -Well-known to the pulmonology team, acute on chronic COPD exacerbation with failure of outpatient antibiotic and steroid taper. Treated with several abx as outpt this past month (augmentin, azithromycin, levofloxacin) -PE study negative for filling defect with extensive mucous plugging of the bilateral lower lung fields concerning for aspiration. -Irregular solid nodule of the right lower lobe -Pulmonology consulted, appreciate insight recommendations -Continue steroid taper, nebulizers, mucolytic's and antibiotics.  -Cultures remain negative, likely secondary to prior antibiotic courses -Continue to wean oxygen as appropriate - baseline appears to be 3L at rest 5L with exertion - PT consult today -tentative plan for d/c tomorrow - bnp is elevated, had unremarkable tte last year, today received lasix x1 and tte is pending. She does report some recent orthopnea    Pulmonary nodule -Pulmonology following as above- plan is outpt f/u  Pulmonary hypertension (HCC) -We will continue Roflumilast.    Dyslipidemia - We will continue statin therapy.   PVD (peripheral vascular disease) (HCC) - The patient has symptomatic carotid stenosis. - We will continue aspirin and Plavix.   Peripheral neuropathy - We will continue Neurontin.   Depression - We will continue Lexapro.   GERD without esophagitis - We will continue PPI therapy.  DVT prophylaxis: enoxaparin (LOVENOX) injection 40 mg Start: 05/10/23 2200   Code Status:   Code Status: Full Code  Family Communication: husband updated @ bedside 10/25  Status is: Inpatient  Dispo: The patient is from: Home              Anticipated d/c is to: home, tomorrow  Consultants:  Pulmonology  Procedures:  None planned  Antimicrobials:  Azithromycin, ceftriaxone > azithromycin  Subjective: No acute issues or events overnight, reports sleeping better, reports dyspnea improving some  Objective: Vitals:   05/12/23 0545 05/12/23 0722 05/12/23 0948 05/12/23 1105  BP: 129/64  (!) 155/56   Pulse: 68     Resp: 18     Temp: 98 F (36.7 C)     TempSrc: Oral     SpO2: 97% 98%  98%  Weight:        Intake/Output Summary (Last 24 hours) at 05/12/2023 1126 Last data filed at 05/12/2023 0950 Gross per 24 hour  Intake 720 ml  Output --  Net 720 ml   Filed Weights   05/10/23 1535  Weight: 61.2 kg    Examination:  General:  Pleasantly resting in bed, No acute distress. HEENT:  Normocephalic atraumatic.    Neck:  Without mass or deformity.  Trachea is midline. Lungs: scattered rhonchi Heart:  Regular rate and rhythm.  Without murmurs, rubs, or gallops. Abdomen:  Soft,  nontender, nondistended.  Without guarding or rebound. Extremities: Without cyanosis, clubbing, edema, or obvious deformity. Skin:  Warm and dry, no visible lesions  Data Reviewed: I have personally reviewed following labs and imaging studies  CBC: Recent Labs  Lab 05/10/23 1640 05/11/23 0529 05/12/23 0627  WBC 13.1* 13.1* 15.2*  NEUTROABS 9.1*  --    --   HGB 11.9* 10.6* 10.6*  HCT 36.4 32.4* 32.2*  MCV 97.1 97.3 96.1  PLT 300 252 256   Basic Metabolic Panel: Recent Labs  Lab 05/10/23 1640 05/11/23 0529 05/12/23 0627  NA 140 138 135  K 3.5 4.5 3.9  CL 106 102 101  CO2 26 26 24   GLUCOSE 118* 115* 95  BUN 18 15 17   CREATININE 0.74 0.67 0.63  CALCIUM 8.9 9.0 9.0   GFR: Estimated Creatinine Clearance: 63.6 mL/min (by C-G formula based on SCr of 0.63 mg/dL). Liver Function Tests: Recent Labs  Lab 05/10/23 1640 05/12/23 0627  AST 16 15  ALT 18 20  ALKPHOS 64 45  BILITOT 0.5 0.7  PROT 6.7 5.9*  ALBUMIN 3.7 3.1*   Recent Results (from the past 240 hour(s))  Expectorated Sputum Assessment w Gram Stain, Rflx to Resp Cult     Status: None   Collection Time: 05/10/23  5:36 PM   Specimen: Expectorated Sputum  Result Value Ref Range Status   Specimen Description EXPECTORATED SPUTUM  Final   Special Requests NONE  Final   Sputum evaluation   Final    THIS SPECIMEN IS ACCEPTABLE FOR SPUTUM CULTURE Performed at [facility], [address], [place], [place] [address]    Report Status 05/10/2023 FINAL  Final  Culture, Respiratory w Gram Stain     Status: None (Preliminary result)   Collection Time: 05/10/23  5:36 PM  Result Value Ref Range Status   Specimen Description   Final    EXPECTORATED SPUTUM Performed at [facility], [address], [place], [place] [address]    Special Requests   Final    NONE Reflexed from [id number] Performed at [facility], [address] [place], [place] [address]    Gram Stain   Final    RARE WBC PRESENT,BOTH PMN AND MONONUCLEAR FEW GRAM POSITIVE COCCI FEW YEAST WITH PSEUDOHYPHAE    Culture   Final    CULTURE REINCUBATED FOR BETTER GROWTH Performed at [facility], [address], [place], [place] [address]    Report Status PENDING  Incomplete  Respiratory (~20 pathogens) panel by PCR     Status: None   Collection Time:  05/11/23  4:28 PM   Specimen: Nasopharyngeal Swab; Respiratory  Result Value Ref Range Status   Adenovirus NOT DETECTED NOT DETECTED Final   Coronavirus 229E NOT DETECTED NOT DETECTED Final    Comment: (NOTE) The Coronavirus on the Respiratory Panel, DOES NOT test for the novel  Coronavirus (2019 nCoV)    Coronavirus HKU1 NOT DETECTED NOT DETECTED Final   Coronavirus NL63 NOT DETECTED NOT DETECTED Final   Coronavirus OC43 NOT DETECTED NOT DETECTED Final   Metapneumovirus NOT DETECTED NOT DETECTED Final   Rhinovirus / Enterovirus NOT DETECTED NOT DETECTED Final   Influenza A NOT DETECTED NOT DETECTED Final   Influenza B NOT DETECTED NOT DETECTED Final   Parainfluenza Virus 1 NOT DETECTED NOT DETECTED Final   Parainfluenza Virus 2 NOT DETECTED NOT DETECTED Final   Parainfluenza Virus 3 NOT DETECTED NOT DETECTED Final   Parainfluenza Virus  4 NOT DETECTED NOT DETECTED Final   Respiratory Syncytial Virus NOT DETECTED NOT DETECTED Final   Bordetella pertussis NOT DETECTED NOT DETECTED Final   Bordetella Parapertussis NOT DETECTED NOT DETECTED Final   Chlamydophila pneumoniae NOT DETECTED NOT DETECTED Final   Mycoplasma pneumoniae NOT DETECTED NOT DETECTED Final    Comment: Performed at [facility], [address] [place], [place] [address]         Radiology Studies: CT Angio Chest Pulmonary Embolism (PE) W or WO Contrast  Result Date: 05/11/2023 CLINICAL DATA:  COPD exacerbation EXAM: CT ANGIOGRAPHY CHEST WITH CONTRAST TECHNIQUE: Multidetector CT imaging of the chest was performed using the standard protocol during bolus administration of intravenous contrast. Multiplanar CT image reconstructions and MIPs were obtained to evaluate the vascular anatomy. RADIATION DOSE REDUCTION: This exam was performed according to the departmental dose-optimization program which includes automated exposure control, adjustment of the mA and/or kV according to patient size and/or use of iterative  reconstruction technique. CONTRAST:  75mL OMNIPAQUE IOHEXOL 350 MG/ML SOLN COMPARISON:  Lung cancer screening CT dated April 21, 2023 FINDINGS: Cardiovascular: No evidence of pulmonary embolus. Normal heart size. No pericardial effusion. Normal caliber aorta with moderate atherosclerotic disease. Mediastinum/Nodes: Esophagus and thyroid are unremarkable. No enlarged lymph nodes seen in the chest. Lungs/Pleura: Central airways are patent. Severe emphysema. Mucous plugging of the bilateral lower lobes and bronchial wall thickening. Irregular solid nodule of the right lower lobe measuring 1.6 x 1.5 cm on series 4, image 121, unchanged in size when compared with recent lung cancer screening CT. No pleural effusion or pneumothorax. Upper Abdomen: No acute abnormality. Musculoskeletal: No chest wall abnormality. No acute or significant osseous findings. Review of the MIP images confirms the above findings. IMPRESSION: 1. No evidence of pulmonary embolus. 2. Extensive mucous plugging of the bilateral lower lobes, likely due to aspiration. 3. Irregular solid nodule of the right lower lobe, unchanged when compared with the recent lung cancer screening CT and suspicious for primary lung malignancy. 4. Moderate coronary artery calcifications, aortic Atherosclerosis (ICD10-I70.0) and Emphysema (ICD10-J43.9). Electronically Signed   By: Allegra Lai M.D.   On: 05/11/2023 13:40   DG Chest 2 View  Result Date: 05/10/2023 CLINICAL DATA:  COPD exacerbation. EXAM: CHEST - 2 VIEW COMPARISON:  Radiograph 05/01/2023, CT 04/21/2023 FINDINGS: Chronic hyperinflation, emphysema and bronchial thickening. Resolved opacity at the right lung base. No new airspace disease. No pulmonary edema, pleural effusion, or pneumothorax. No acute osseous findings. IMPRESSION: 1. Resolved opacity at the right lung base. No new airspace disease. 2. Chronic hyperinflation, emphysema and bronchial thickening. Electronically Signed   By: Narda Rutherford M.D.   On: 05/10/2023 20:48        Scheduled Meds:  acetylcysteine  2 mL Nebulization QID   aspirin EC  81 mg Oral Daily   atorvastatin  10 mg Oral Daily   clopidogrel  75 mg Oral Daily   enoxaparin (LOVENOX) injection  40 mg Subcutaneous Q24H   escitalopram  20 mg Oral Daily   guaiFENesin  600 mg Oral BID   ipratropium-albuterol  3 mL Nebulization Q4H   methylPREDNISolone (SOLU-MEDROL) injection  40 mg Intravenous Q12H   midodrine  10 mg Oral BID WC   oxybutynin  10 mg Oral Daily   pantoprazole  40 mg Oral Daily   roflumilast  500 mcg Oral Daily   sodium chloride HYPERTONIC  4 mL Nebulization BID   Continuous Infusions:  azithromycin 500 mg (05/11/23 1812)  LOS: 2 days      Silvano Bilis, MD [facility]  If 7PM-7AM, please contact night-coverage [email or web address]  05/12/2023, 11:26 AM

## 2023-05-12 NOTE — Consult Note (Signed)
NAME:  Maria [name], MRN:  [id number], DOB:  Jul 23, 1955, LOS: 2 ADMISSION DATE:  05/10/2023, CONSULTATION DATE:  05/11/23 REFERRING MD:  Dr Natale Milch, CHIEF COMPLAINT:  COPD exac    History of Present Illness:   Referred fro pulmonary research yesterday. She presented yesterday 05/10/23 for Week 32 of injections AStegolimab v Placebo (COOPD study)  and history noted was  Patient is coming for a routine week 32 visit.  This is scheduled injection visit.  Investigator involvement is not normally required for this visit but she has been symptomatic and therefore I decided to see her on unscheduled basis.  She is tells me that ever since the diagnosis of cellulitis on April 05, 2023 she has not been feeling well and she has been progressively declining.  Review of the records at that time show she made a visit to her primary care physician's office.  She did have URI symptoms at that time and also has a rash in the lower extremities.      On 03/29/2023 she had a routine injection visit for the research study. April 05, 2023 labs did indicate a positive ANA which is a known medical history but also low complements and a trace positive ANA.  Rheumatology referral was made.   This was treated with a diagnosis of cellulitis on 04/07/2023 2 days later.  She was given a course of cephalexin for a week.   On 04/12/2023 she had another routine research injection visit.   Then on April 14, 2023 she had a screening visit for low-dose lung cancer screening CT chest   Then on 04/18/2023 she saw her family next practitioner for acute exacerbation COPD and was given a course of Augmentin and prednisone a chest x-ray was done.   Then on 04/21/23 she underwent low-dose CT scan of the chest [this showed right lower lobe Nodule +/-0 pneumonia inmo but the result was not available till 05/06/2023.   Then on 04/26/2023 she had another routine research injection visit.  Research coordinator reported COPD  exacerbation symptoms to me the principal investigator and her primary pulmonologist.  I sent in prednisone and Z-Pak.  Basically symptoms are ongoing and not getting better.  Then on 05/04/2023 she called in saying that symptoms were no better.  When I visualized the CT scan [formal results not ready till 2 days later] I thouht right lower lobe pneumonia was noted in the clinical contex (radiology called it nodule +/- surround inflammation) .  I sent in Levaquin and prednisone.      Then 05/10/2023 she comes in for week 32 research injection visit             -She reports that she is not getting any better.  In fact she is having significant orthopnea.  Baseline she sleeps at a 15-30 degree angle but now sleeping at almost 90 degree angle.  Her oxygen use at rest at baseline is 3 L nasal cannula currently cranking it up to 4-5 L nasal cannula based on subjective symptoms [pulse ox 98% on her 4 L at the office right now].  She is also fatigued and feeling weak.  She is coughing a lot more than Most[place] dry. She has lost weight.  In February 2024 she was 145.6 pounds at the entry into the study.  Currently she is 130 pounds according to her history .  There is no fever or edema hemoptysis or vomiting.     Labs  - She has new onset  leukocytosis since August 2024 -September 2024 has ongoing positive trace ANA but also trace positive low complement levels and ENA positive [this would be considered adverse event for the study] -Procalcitonin is normal -Echo is pending for next week    Admitted to the hospital by the hospitalist service 05/10/2023 and started on IV steroids and also Antibiotics.  CT angiogram ruled out pulmonary embolism but shows mucous plugging in the lower lobes.  In addition the right lower lobe density is more described as a nodule and is persistent.  Radiologist concerned about early stage lung cancer.  Patient is beginning to feel better.  She says she is able to recline a little bit  more.  Daughter at the bedside.  She is better on 05/11/2023.  But in the context of the overall illness pulmonary medicine called.     Past Medical History:    has a past medical history of Atherosclerosis (1/0612014), Blood transfusion without reported diagnosis (2005), Cancer (HCC) (Ovarian 1978), Chest pain, COPD (chronic obstructive pulmonary disease) (HCC), COVID-19 long hauler manifesting chronic dyspnea, Emphysema of lung (HCC) (2019), Oxygen deficiency (03/2019), and Oxygen dependent.   has a past surgical history that includes Abdominal hysterectomy (1978); Ovarian cyst removal (1988); Neck surgery (1999); Spine surgery (Neck 2002); Cholecystectomy (2010); Colonoscopy; Colonoscopy; Esophagogastroduodenoscopy; Colonoscopy with propofol (N/A, 11/01/2021); CAROTID PTA/STENT INTERVENTION (Left, 03/07/2023); Breast biopsy (Left, 04/04/2023); and Breast biopsy (Left, 04/04/2023).     Significant Hospital Events:  05/10/2023 - admit for research clinic 10/24 - seen for pccm consult -> strt mucomyst, RLL nodule identified. CTA negative for PE    SUBJECTIVE/OVERNIGHT/INTERVAL HX   05/12/23: RVP negative . PCT neg, Urine strep negative. AFebvrile on 4L [place] - 98%.  BNP elevaed 173. But trop normal. ECHO pending. Feeling better . Sleept better  Objective   Blood pressure 129/64, pulse 68, temperature 98 F (36.7 C), temperature source Oral, resp. rate 18, weight 61.2 kg, SpO2 98%.        Intake/Output Summary (Last 24 hours) at 05/12/2023 0806 Last data filed at 05/11/2023 1314 Gross per 24 hour  Intake 720 ml  Output --  Net 720 ml   Filed Weights   05/10/23 1535  Weight: 61.2 kg    Examination: General: No distress. Looks well Neuro: Alert and Oriented x 3. GCS 15. Speech normal Psych: Pleasant Resp:  Barrel Chest - yes.  Wheeze - no, Crackles - no, No overt respiratory distress CVS: Normal heart sounds. Murmurs - no Ext: Stigmata of Connective Tissue Disease - no HEENT:  Normal upper airway. PEERL +. No post nasal drip   Resolved Hospital Problem list   x  Assessment & Plan:  ASSESSMENT / PLAN:  PULMONARY  A:  Advanced COPD with chronic respiratory failure presents with what looks like a unresolved exacerbation despite multiple course of antibiotics) known in the outpatient setting  Right lower lobe nodular density 04/21/2023 new onset and CT scan, treated with outpatient antibiotic therapy but persist on admission CT 05/07/2023  Pulmonary embolism ruled out 05/11/23  Significant mucous plugging present CT 05/11/23. RVP negative  Mild BNP elevation -0 new 05/11/23. Trop normal   05/12/2023 -> subjectively better after starting IV steroids at admit and mucomyst 05/11/23. On CAP Rx but no evidence of infection/pneumoia. BNP   P:    DC Ceftriaxone Continue Azithromycin IV 05/12/2023 - > po 05/13/23 for anti inflmamtory reffec   Continue steroids but maintain IV therapy at 40 mg Solu-Medrol twice daily -> PO perd 05/13/23  Continue baseline 3 mL hypertonic saline nebulizer for mucus clearance  Start 36-hour Mucomyst 10/24 - 05/13/23  Continue scheduled bronchodilatation DuoNeb but increase it to every 4 hours  Continue Roflumilast  Consier NEW FDA approved medicine ORTHOVAYRE as opd  Hold study drug for the moment - likely ok for next cycle   BNP elevation  Plan  - await ECHO  - lasix 20mg  IV x 1   RLL NODULE\  PLAn Address nodule with outpatient PET scan in 6 weeks [probably very high risk for biopsy and if early stage lung cancer suspected would need empiric radiation therapy]  DC planing Might likely benefit from rehab/physical therapy evaluation - oprdered 05/12/2023      Best practice (daily eval):  According to the hospitalist  Goals of Care:    patient updated the bedside Anticopated discharge 1-2 days SIGNATURE    Dr. Kalman Shan, M.D., F.C.C.P,  Pulmonary and Critical Care Medicine Staff Physician,  [facility] Director - Interstitial Lung Disease  Program  Pulmonary Fibrosis [facility] at [facility] [place], [place], [address]  NPI Number:  NPI #[id number]  Pager: [phone], If no answer  -> Check AMION or Try [phone] Telephone (clinical office): [phone] Telephone (research): [phone]  8:06 AM 05/12/2023   05/12/2023 8:06 AM    LABS    PULMONARY No results for input(s): "PHART", "PCO2ART", "PO2ART", "HCO3", "TCO2", "O2SAT" in the last 168 hours.  Invalid input(s): "PCO2", "PO2"  CBC Recent Labs  Lab 05/10/23 1640 05/11/23 0529 05/12/23 0627  HGB 11.9* 10.6* 10.6*  HCT 36.4 32.4* 32.2*  WBC 13.1* 13.1* 15.2*  PLT 300 252 256    COAGULATION No results for input(s): "INR" in the last 168 hours.  CARDIAC  No results for input(s): "TROPONINI" in the last 168 hours. No results for input(s): "PROBNP" in the last 168 hours.  CHEMISTRY Recent Labs  Lab 05/10/23 1640 05/11/23 0529 05/12/23 0627  NA 140 138 135  K 3.5 4.5 3.9  CL 106 102 101  CO2 26 26 24   GLUCOSE 118* 115* 95  BUN 18 15 17   CREATININE 0.74 0.67 0.63  CALCIUM 8.9 9.0 9.0   Estimated Creatinine Clearance: 63.6 mL/min (by C-G formula based on SCr of 0.63 mg/dL).   LIVER Recent Labs  Lab 05/10/23 1640 05/12/23 0627  AST 16 15  ALT 18 20  ALKPHOS 64 45  BILITOT 0.5 0.7  PROT 6.7 5.9*  ALBUMIN 3.7 3.1*     INFECTIOUS Recent Labs  Lab 05/11/23 0529  PROCALCITON <0.10     ENDOCRINE CBG (last 3)  No results for input(s): "GLUCAP" in the last 72 hours.       IMAGING x48h  - image(s) personally visualized  -   highlighted in bold CT Angio Chest Pulmonary Embolism (PE) W or WO Contrast  Result Date: 05/11/2023 CLINICAL DATA:  COPD exacerbation EXAM: CT ANGIOGRAPHY CHEST WITH CONTRAST TECHNIQUE: Multidetector CT imaging of the chest was performed using the standard protocol during bolus administration of  intravenous contrast. Multiplanar CT image reconstructions and MIPs were obtained to evaluate the vascular anatomy. RADIATION DOSE REDUCTION: This exam was performed according to the departmental dose-optimization program which includes automated exposure control, adjustment of the mA and/or kV according to patient size and/or use of iterative reconstruction technique. CONTRAST:  75mL OMNIPAQUE IOHEXOL 350 MG/ML SOLN COMPARISON:  Lung cancer screening CT dated April 21, 2023 FINDINGS: Cardiovascular: No evidence of  pulmonary embolus. Normal heart size. No pericardial effusion. Normal caliber aorta with moderate atherosclerotic disease. Mediastinum/Nodes: Esophagus and thyroid are unremarkable. No enlarged lymph nodes seen in the chest. Lungs/Pleura: Central airways are patent. Severe emphysema. Mucous plugging of the bilateral lower lobes and bronchial wall thickening. Irregular solid nodule of the right lower lobe measuring 1.6 x 1.5 cm on series 4, image 121, unchanged in size when compared with recent lung cancer screening CT. No pleural effusion or pneumothorax. Upper Abdomen: No acute abnormality. Musculoskeletal: No chest wall abnormality. No acute or significant osseous findings. Review of the MIP images confirms the above findings. IMPRESSION: 1. No evidence of pulmonary embolus. 2. Extensive mucous plugging of the bilateral lower lobes, likely due to aspiration. 3. Irregular solid nodule of the right lower lobe, unchanged when compared with the recent lung cancer screening CT and suspicious for primary lung malignancy. 4. Moderate coronary artery calcifications, aortic Atherosclerosis (ICD10-I70.0) and Emphysema (ICD10-J43.9). Electronically Signed   By: Allegra Lai M.D.   On: 05/11/2023 13:40   DG Chest 2 View  Result Date: 05/10/2023 CLINICAL DATA:  COPD exacerbation. EXAM: CHEST - 2 VIEW COMPARISON:  Radiograph 05/01/2023, CT 04/21/2023 FINDINGS: Chronic hyperinflation, emphysema and bronchial  thickening. Resolved opacity at the right lung base. No new airspace disease. No pulmonary edema, pleural effusion, or pneumothorax. No acute osseous findings. IMPRESSION: 1. Resolved opacity at the right lung base. No new airspace disease. 2. Chronic hyperinflation, emphysema and bronchial thickening. Electronically Signed   By: Narda Rutherford M.D.   On: 05/10/2023 20:48

## 2023-05-13 DIAGNOSIS — J441 Chronic obstructive pulmonary disease with (acute) exacerbation: Secondary | ICD-10-CM | POA: Diagnosis not present

## 2023-05-13 DIAGNOSIS — I272 Pulmonary hypertension, unspecified: Secondary | ICD-10-CM | POA: Diagnosis not present

## 2023-05-13 DIAGNOSIS — I739 Peripheral vascular disease, unspecified: Secondary | ICD-10-CM | POA: Diagnosis not present

## 2023-05-13 DIAGNOSIS — F32A Depression, unspecified: Secondary | ICD-10-CM | POA: Diagnosis not present

## 2023-05-13 LAB — MAGNESIUM: Magnesium: 2.3 mg/dL (ref 1.7–2.4)

## 2023-05-13 LAB — PHOSPHORUS: Phosphorus: 3.9 mg/dL (ref 2.5–4.6)

## 2023-05-13 MED ORDER — GUAIFENESIN ER 600 MG PO TB12: 600.0000 mg | ORAL_TABLET | Freq: Two times a day (BID) | ORAL | 0 refills | Status: AC

## 2023-05-13 MED ORDER — AZITHROMYCIN 250 MG PO TABS: 250.0000 mg | ORAL_TABLET | Freq: Every day | ORAL | 0 refills | Status: AC

## 2023-05-13 MED ORDER — HYDROCOD POLI-CHLORPHE POLI ER 10-8 MG/5ML PO SUER: 5.0000 mL | Freq: Two times a day (BID) | ORAL | 0 refills | Status: DC | PRN

## 2023-05-13 MED ORDER — PREDNISONE 50 MG PO TABS: 50.0000 mg | ORAL_TABLET | Freq: Every day | ORAL | 0 refills | Status: DC

## 2023-05-13 MED ORDER — PREDNISONE 10 MG PO TABS: ORAL_TABLET | ORAL | 0 refills | Status: AC

## 2023-05-13 NOTE — Progress Notes (Signed)
Pt d/c'd home prior to completion of TOC assessment.

## 2023-05-13 NOTE — Progress Notes (Signed)
SATURATION QUALIFICATIONS: (This note is used to comply with regulatory documentation for home oxygen)  Patient Saturations on Room Air at Rest = 95%  Patient Saturations on Room Air while Ambulating = 89%  Patient Saturations on 2 Liters of oxygen while Ambulating = 92%  Please briefly explain why patient needs home oxygen:

## 2023-05-13 NOTE — Plan of Care (Signed)

## 2023-05-13 NOTE — Progress Notes (Signed)
Mesag from Dr Alanda Slim of [facility] stable for discharged  Echo I snormal  Would recommend a 12-14 day pred taper - Please take Take prednisone 40mg  once daily x 3 days, then 30mg  once daily x 3 days, then 20mg  once daily x 3 days, then prednisone 10mg  once daily  x 3 days and then prednisone 5mg  x 3 day and then stop  Rest per my note yesterday    SIGNATURE    Dr. Kalman Shan, M.D., F.C.C.P,  Pulmonary and Critical Care Medicine Staff Physician, [facility] Director - Interstitial Lung Disease  Program  Pulmonary Fibrosis [facility] at [facility] [place], [place], [address]   Pager: [phone], If no answer  -> Check AMION or Try [phone] Telephone (clinical office): [phone] Telephone (research): [phone]  10:59 AM 05/13/2023

## 2023-05-13 NOTE — Progress Notes (Signed)
PT Cancellation Note/ Screen  Patient Details Name: Maria [name] MRN: [id number] DOB: [birth year]   Cancelled Treatment:    Reason Eval/Treat Not Completed: PT screened, no needs identified, will sign off Pt reports ambulating in hallway this morning and to discharge home today.  Pt did not feel she needed acute PT.  Pt has MD appointment on Monday and will request PT if needed after discharge however pt feels able to return home.   Kati L Payson 05/13/2023, 11:09 AM Paulino Door, DPT Physical Therapist Acute [facility] Services Office: [phone]

## 2023-05-13 NOTE — Discharge Summary (Signed)
Physician Discharge Summary  Maria [name] IHK:[id number] DOB: 02-Aug-1955 DOA: 05/10/2023  PCP: Eden Emms, NP  Admit date: 05/10/2023 Discharge date: 05/13/2023 Admitted From: Home Disposition: Home Recommendations for Outpatient Follow-up:  Outpatient follow-up with PCP as below Pulmonology to arrange outpatient follow-up Check CMP and CBC in 1 week Please follow up on the following pending results: None  Home Health: Not indicated Equipment/Devices: Patient has home oxygen  Discharge Condition: Stable CODE STATUS: Full code  Follow-up Information     Eden Emms, NP. Schedule an appointment as soon as possible for a visit in 1 week(s).   Specialties: Nurse Practitioner, Family Medicine Contact information: [address] [place] [address] [phone]                 Hospital course 66 year old F with PMH of COPD/chronic hypoxic RF on 3 to 5 L at baseline presenting with shortness of breath and cough for about a month, and admitted for COPD exacerbation.  CT chest showed extensive mucous plugging in the bilateral lower lung fields concerning for aspiration, and irregular solid nodule at RLL.  Pulmonology consulted.  Patient was started on systemic steroid, nebulizers, mucolytic's and antibiotics.  Eventually, symptoms improved.   On the day of discharge, she felt well and ready to go home.  She was ambulated on room air and maintain saturation above 89% that has improved to 90s with 2 L by nasal cannula.  She was cleared for discharge by pulmonology on prednisone taper.  She will be discharged on azithromycin for 2 more days to complete a total of 5 days course.  She is optimized on LABA/LAMA/ICS.  Of note, patient is on a research study for a phase 3 randomized double-blind, placebo controlled multicenter study on Astegolimab (an IL-33 binder) prior to hospitalization.  See individual problem list below for more.   Problems addressed during this  hospitalization Principal Problem:   COPD exacerbation (HCC) Active Problems:   Pulmonary hypertension (HCC)   Dyslipidemia   PVD (peripheral vascular disease) (HCC)   GERD without esophagitis   Depression   Peripheral neuropathy   Acute on chronic hypoxic respiratory failure due to COPD exacerbation, respiratory failure resolved.  CT angio chest negative for PE but extensive mucous plugging to bilateral lower lung fields concerning for aspiration.  Echocardiogram without significant finding. -Cleared for discharge on systemic steroid, antibiotics, inhalers and nebulizers.    Pulmonary nodule -Outpatient follow-up.   Pulmonary hypertension (HCC) -Continue Roflumilast.   Dyslipidemia -Continue ontinue statin therapy.   PVD with symptomatic carotid stenosis: -continue aspirin and Plavix.   Peripheral neuropathy -Continue home Neurontin   Depression -Continue home Lexapro   GERD without esophagitis -Continue PPI           Time spent 35 minutes  Vital signs Vitals:   05/12/23 2143 05/12/23 2313 05/13/23 0542 05/13/23 0911  BP: (!) 159/63  127/61   Pulse: 63  (!) 56   Temp: 98.7 F (37.1 C)  98.4 F (36.9 C)   Resp: 14  14   Weight:      SpO2: 98% 99% 100% 100%  TempSrc: Oral  Oral      Discharge exam  GENERAL: No apparent distress.  Nontoxic. HEENT: MMM.  Vision and hearing grossly intact.  NECK: Supple.  No apparent JVD.  RESP:  No IWOB.  Fair aeration bilaterally. CVS:  RRR. Heart sounds normal.  ABD/GI/GU: BS+. Abd soft, NTND.  MSK/EXT:  Moves extremities. No apparent  daily with a meal.   oxybutynin 10 MG 24 hr tablet Commonly known as: DITROPAN-XL Take 1 tablet (10 mg total) by mouth daily.   OXYGEN Inhale into the lungs. 3 liters at rest and 5 liters with any activity.-   pantoprazole 40 MG tablet Commonly known as: PROTONIX Take 1 tablet by mouth once daily   Potassium 99 MG Tabs Take 99 mg by mouth daily.   predniSONE 10 MG tablet Commonly known as: DELTASONE Take 4 tablets (40 mg total) by mouth daily for 3 days, THEN 3 tablets (30 mg total) daily for 3 days, THEN 2 tablets (20 mg total) daily for 3 days, THEN 1 tablet (10 mg total) daily for 3 days, THEN 0.5 tablets (5 mg total) daily for 3 days. Start taking on: May 13, 2023 What changed:  See the new instructions. Another medication with the same name was removed. Continue taking this medication, and follow the directions you see here.   roflumilast 500 MCG Tabs tablet Commonly known as: DALIRESP Take 1 tablet by mouth once daily   sodium chloride HYPERTONIC 3 % nebulizer  solution Take by nebulization 2 (two) times daily. What changed:  how much to take when to take this   Vitamin D-3 125 MCG (5000 UT) Tabs Take 5,000 Units by mouth every evening.        Consultations: Pulmonology  Procedures/Studies:   ECHOCARDIOGRAM COMPLETE  Result Date: 05/12/2023    ECHOCARDIOGRAM REPORT   Patient Name:   Maria [name] Date of Exam: 05/12/2023 Medical Rec #:  [id number]      Height:       65.5 in Accession #:    [id number]     Weight:       135.0 lb Date of Birth:  [birth year]       BSA:          1.683 m Patient Age:    67 years       BP:           129/64 mmHg Patient Gender: F              HR:           69 bpm. Exam Location:  Inpatient Procedure: 2D Echo, Cardiac Doppler and Color Doppler Indications:    R06.02 SOB. Acute respiratory failure.  History:        Patient has prior history of Echocardiogram examinations, most                 recent 04/27/2022. COPD, Signs/Symptoms:Syncope; Risk                 Factors:Hypertension, Dyslipidemia and Current Smoker.  Sonographer:    Sheralyn Boatman RDCS Referring Phys: 85 MURALI RAMASWAMY IMPRESSIONS  1. Left ventricular ejection fraction, by estimation, is 60 to 65%. The left ventricle has normal function. The left ventricle has no regional wall motion abnormalities. Left ventricular diastolic parameters are indeterminate. Elevated left atrial pressure. The E/e' is 19.  2. Right ventricular systolic function is normal. The right ventricular size is normal.  3. The mitral valve is normal in structure. No evidence of mitral valve regurgitation. No evidence of mitral stenosis.  4. The aortic valve is grossly normal. Unable to determine aortic valve morphology due to image quality. Aortic valve regurgitation is not visualized. No aortic stenosis is present.  5. The inferior vena cava is normal in size with greater than 50% respiratory variability, suggesting right atrial pressure of 3  Physician Discharge Summary  Maria [name] IHK:[id number] DOB: 02-Aug-1955 DOA: 05/10/2023  PCP: Eden Emms, NP  Admit date: 05/10/2023 Discharge date: 05/13/2023 Admitted From: Home Disposition: Home Recommendations for Outpatient Follow-up:  Outpatient follow-up with PCP as below Pulmonology to arrange outpatient follow-up Check CMP and CBC in 1 week Please follow up on the following pending results: None  Home Health: Not indicated Equipment/Devices: Patient has home oxygen  Discharge Condition: Stable CODE STATUS: Full code  Follow-up Information     Eden Emms, NP. Schedule an appointment as soon as possible for a visit in 1 week(s).   Specialties: Nurse Practitioner, Family Medicine Contact information: [address] [place] [address] [phone]                 Hospital course 66 year old F with PMH of COPD/chronic hypoxic RF on 3 to 5 L at baseline presenting with shortness of breath and cough for about a month, and admitted for COPD exacerbation.  CT chest showed extensive mucous plugging in the bilateral lower lung fields concerning for aspiration, and irregular solid nodule at RLL.  Pulmonology consulted.  Patient was started on systemic steroid, nebulizers, mucolytic's and antibiotics.  Eventually, symptoms improved.   On the day of discharge, she felt well and ready to go home.  She was ambulated on room air and maintain saturation above 89% that has improved to 90s with 2 L by nasal cannula.  She was cleared for discharge by pulmonology on prednisone taper.  She will be discharged on azithromycin for 2 more days to complete a total of 5 days course.  She is optimized on LABA/LAMA/ICS.  Of note, patient is on a research study for a phase 3 randomized double-blind, placebo controlled multicenter study on Astegolimab (an IL-33 binder) prior to hospitalization.  See individual problem list below for more.   Problems addressed during this  hospitalization Principal Problem:   COPD exacerbation (HCC) Active Problems:   Pulmonary hypertension (HCC)   Dyslipidemia   PVD (peripheral vascular disease) (HCC)   GERD without esophagitis   Depression   Peripheral neuropathy   Acute on chronic hypoxic respiratory failure due to COPD exacerbation, respiratory failure resolved.  CT angio chest negative for PE but extensive mucous plugging to bilateral lower lung fields concerning for aspiration.  Echocardiogram without significant finding. -Cleared for discharge on systemic steroid, antibiotics, inhalers and nebulizers.    Pulmonary nodule -Outpatient follow-up.   Pulmonary hypertension (HCC) -Continue Roflumilast.   Dyslipidemia -Continue ontinue statin therapy.   PVD with symptomatic carotid stenosis: -continue aspirin and Plavix.   Peripheral neuropathy -Continue home Neurontin   Depression -Continue home Lexapro   GERD without esophagitis -Continue PPI           Time spent 35 minutes  Vital signs Vitals:   05/12/23 2143 05/12/23 2313 05/13/23 0542 05/13/23 0911  BP: (!) 159/63  127/61   Pulse: 63  (!) 56   Temp: 98.7 F (37.1 C)  98.4 F (36.9 C)   Resp: 14  14   Weight:      SpO2: 98% 99% 100% 100%  TempSrc: Oral  Oral      Discharge exam  GENERAL: No apparent distress.  Nontoxic. HEENT: MMM.  Vision and hearing grossly intact.  NECK: Supple.  No apparent JVD.  RESP:  No IWOB.  Fair aeration bilaterally. CVS:  RRR. Heart sounds normal.  ABD/GI/GU: BS+. Abd soft, NTND.  MSK/EXT:  Moves extremities. No apparent  daily with a meal.   oxybutynin 10 MG 24 hr tablet Commonly known as: DITROPAN-XL Take 1 tablet (10 mg total) by mouth daily.   OXYGEN Inhale into the lungs. 3 liters at rest and 5 liters with any activity.-   pantoprazole 40 MG tablet Commonly known as: PROTONIX Take 1 tablet by mouth once daily   Potassium 99 MG Tabs Take 99 mg by mouth daily.   predniSONE 10 MG tablet Commonly known as: DELTASONE Take 4 tablets (40 mg total) by mouth daily for 3 days, THEN 3 tablets (30 mg total) daily for 3 days, THEN 2 tablets (20 mg total) daily for 3 days, THEN 1 tablet (10 mg total) daily for 3 days, THEN 0.5 tablets (5 mg total) daily for 3 days. Start taking on: May 13, 2023 What changed:  See the new instructions. Another medication with the same name was removed. Continue taking this medication, and follow the directions you see here.   roflumilast 500 MCG Tabs tablet Commonly known as: DALIRESP Take 1 tablet by mouth once daily   sodium chloride HYPERTONIC 3 % nebulizer  solution Take by nebulization 2 (two) times daily. What changed:  how much to take when to take this   Vitamin D-3 125 MCG (5000 UT) Tabs Take 5,000 Units by mouth every evening.        Consultations: Pulmonology  Procedures/Studies:   ECHOCARDIOGRAM COMPLETE  Result Date: 05/12/2023    ECHOCARDIOGRAM REPORT   Patient Name:   Maria [name] Date of Exam: 05/12/2023 Medical Rec #:  [id number]      Height:       65.5 in Accession #:    [id number]     Weight:       135.0 lb Date of Birth:  [birth year]       BSA:          1.683 m Patient Age:    67 years       BP:           129/64 mmHg Patient Gender: F              HR:           69 bpm. Exam Location:  Inpatient Procedure: 2D Echo, Cardiac Doppler and Color Doppler Indications:    R06.02 SOB. Acute respiratory failure.  History:        Patient has prior history of Echocardiogram examinations, most                 recent 04/27/2022. COPD, Signs/Symptoms:Syncope; Risk                 Factors:Hypertension, Dyslipidemia and Current Smoker.  Sonographer:    Sheralyn Boatman RDCS Referring Phys: 85 MURALI RAMASWAMY IMPRESSIONS  1. Left ventricular ejection fraction, by estimation, is 60 to 65%. The left ventricle has normal function. The left ventricle has no regional wall motion abnormalities. Left ventricular diastolic parameters are indeterminate. Elevated left atrial pressure. The E/e' is 19.  2. Right ventricular systolic function is normal. The right ventricular size is normal.  3. The mitral valve is normal in structure. No evidence of mitral valve regurgitation. No evidence of mitral stenosis.  4. The aortic valve is grossly normal. Unable to determine aortic valve morphology due to image quality. Aortic valve regurgitation is not visualized. No aortic stenosis is present.  5. The inferior vena cava is normal in size with greater than 50% respiratory variability, suggesting right atrial pressure of 3  Physician Discharge Summary  Maria [name] IHK:[id number] DOB: 02-Aug-1955 DOA: 05/10/2023  PCP: Eden Emms, NP  Admit date: 05/10/2023 Discharge date: 05/13/2023 Admitted From: Home Disposition: Home Recommendations for Outpatient Follow-up:  Outpatient follow-up with PCP as below Pulmonology to arrange outpatient follow-up Check CMP and CBC in 1 week Please follow up on the following pending results: None  Home Health: Not indicated Equipment/Devices: Patient has home oxygen  Discharge Condition: Stable CODE STATUS: Full code  Follow-up Information     Eden Emms, NP. Schedule an appointment as soon as possible for a visit in 1 week(s).   Specialties: Nurse Practitioner, Family Medicine Contact information: [address] [place] [address] [phone]                 Hospital course 66 year old F with PMH of COPD/chronic hypoxic RF on 3 to 5 L at baseline presenting with shortness of breath and cough for about a month, and admitted for COPD exacerbation.  CT chest showed extensive mucous plugging in the bilateral lower lung fields concerning for aspiration, and irregular solid nodule at RLL.  Pulmonology consulted.  Patient was started on systemic steroid, nebulizers, mucolytic's and antibiotics.  Eventually, symptoms improved.   On the day of discharge, she felt well and ready to go home.  She was ambulated on room air and maintain saturation above 89% that has improved to 90s with 2 L by nasal cannula.  She was cleared for discharge by pulmonology on prednisone taper.  She will be discharged on azithromycin for 2 more days to complete a total of 5 days course.  She is optimized on LABA/LAMA/ICS.  Of note, patient is on a research study for a phase 3 randomized double-blind, placebo controlled multicenter study on Astegolimab (an IL-33 binder) prior to hospitalization.  See individual problem list below for more.   Problems addressed during this  hospitalization Principal Problem:   COPD exacerbation (HCC) Active Problems:   Pulmonary hypertension (HCC)   Dyslipidemia   PVD (peripheral vascular disease) (HCC)   GERD without esophagitis   Depression   Peripheral neuropathy   Acute on chronic hypoxic respiratory failure due to COPD exacerbation, respiratory failure resolved.  CT angio chest negative for PE but extensive mucous plugging to bilateral lower lung fields concerning for aspiration.  Echocardiogram without significant finding. -Cleared for discharge on systemic steroid, antibiotics, inhalers and nebulizers.    Pulmonary nodule -Outpatient follow-up.   Pulmonary hypertension (HCC) -Continue Roflumilast.   Dyslipidemia -Continue ontinue statin therapy.   PVD with symptomatic carotid stenosis: -continue aspirin and Plavix.   Peripheral neuropathy -Continue home Neurontin   Depression -Continue home Lexapro   GERD without esophagitis -Continue PPI           Time spent 35 minutes  Vital signs Vitals:   05/12/23 2143 05/12/23 2313 05/13/23 0542 05/13/23 0911  BP: (!) 159/63  127/61   Pulse: 63  (!) 56   Temp: 98.7 F (37.1 C)  98.4 F (36.9 C)   Resp: 14  14   Weight:      SpO2: 98% 99% 100% 100%  TempSrc: Oral  Oral      Discharge exam  GENERAL: No apparent distress.  Nontoxic. HEENT: MMM.  Vision and hearing grossly intact.  NECK: Supple.  No apparent JVD.  RESP:  No IWOB.  Fair aeration bilaterally. CVS:  RRR. Heart sounds normal.  ABD/GI/GU: BS+. Abd soft, NTND.  MSK/EXT:  Moves extremities. No apparent  Physician Discharge Summary  Maria [name] IHK:[id number] DOB: 02-Aug-1955 DOA: 05/10/2023  PCP: Eden Emms, NP  Admit date: 05/10/2023 Discharge date: 05/13/2023 Admitted From: Home Disposition: Home Recommendations for Outpatient Follow-up:  Outpatient follow-up with PCP as below Pulmonology to arrange outpatient follow-up Check CMP and CBC in 1 week Please follow up on the following pending results: None  Home Health: Not indicated Equipment/Devices: Patient has home oxygen  Discharge Condition: Stable CODE STATUS: Full code  Follow-up Information     Eden Emms, NP. Schedule an appointment as soon as possible for a visit in 1 week(s).   Specialties: Nurse Practitioner, Family Medicine Contact information: [address] [place] [address] [phone]                 Hospital course 66 year old F with PMH of COPD/chronic hypoxic RF on 3 to 5 L at baseline presenting with shortness of breath and cough for about a month, and admitted for COPD exacerbation.  CT chest showed extensive mucous plugging in the bilateral lower lung fields concerning for aspiration, and irregular solid nodule at RLL.  Pulmonology consulted.  Patient was started on systemic steroid, nebulizers, mucolytic's and antibiotics.  Eventually, symptoms improved.   On the day of discharge, she felt well and ready to go home.  She was ambulated on room air and maintain saturation above 89% that has improved to 90s with 2 L by nasal cannula.  She was cleared for discharge by pulmonology on prednisone taper.  She will be discharged on azithromycin for 2 more days to complete a total of 5 days course.  She is optimized on LABA/LAMA/ICS.  Of note, patient is on a research study for a phase 3 randomized double-blind, placebo controlled multicenter study on Astegolimab (an IL-33 binder) prior to hospitalization.  See individual problem list below for more.   Problems addressed during this  hospitalization Principal Problem:   COPD exacerbation (HCC) Active Problems:   Pulmonary hypertension (HCC)   Dyslipidemia   PVD (peripheral vascular disease) (HCC)   GERD without esophagitis   Depression   Peripheral neuropathy   Acute on chronic hypoxic respiratory failure due to COPD exacerbation, respiratory failure resolved.  CT angio chest negative for PE but extensive mucous plugging to bilateral lower lung fields concerning for aspiration.  Echocardiogram without significant finding. -Cleared for discharge on systemic steroid, antibiotics, inhalers and nebulizers.    Pulmonary nodule -Outpatient follow-up.   Pulmonary hypertension (HCC) -Continue Roflumilast.   Dyslipidemia -Continue ontinue statin therapy.   PVD with symptomatic carotid stenosis: -continue aspirin and Plavix.   Peripheral neuropathy -Continue home Neurontin   Depression -Continue home Lexapro   GERD without esophagitis -Continue PPI           Time spent 35 minutes  Vital signs Vitals:   05/12/23 2143 05/12/23 2313 05/13/23 0542 05/13/23 0911  BP: (!) 159/63  127/61   Pulse: 63  (!) 56   Temp: 98.7 F (37.1 C)  98.4 F (36.9 C)   Resp: 14  14   Weight:      SpO2: 98% 99% 100% 100%  TempSrc: Oral  Oral      Discharge exam  GENERAL: No apparent distress.  Nontoxic. HEENT: MMM.  Vision and hearing grossly intact.  NECK: Supple.  No apparent JVD.  RESP:  No IWOB.  Fair aeration bilaterally. CVS:  RRR. Heart sounds normal.  ABD/GI/GU: BS+. Abd soft, NTND.  MSK/EXT:  Moves extremities. No apparent  Physician Discharge Summary  Maria [name] IHK:[id number] DOB: 02-Aug-1955 DOA: 05/10/2023  PCP: Eden Emms, NP  Admit date: 05/10/2023 Discharge date: 05/13/2023 Admitted From: Home Disposition: Home Recommendations for Outpatient Follow-up:  Outpatient follow-up with PCP as below Pulmonology to arrange outpatient follow-up Check CMP and CBC in 1 week Please follow up on the following pending results: None  Home Health: Not indicated Equipment/Devices: Patient has home oxygen  Discharge Condition: Stable CODE STATUS: Full code  Follow-up Information     Eden Emms, NP. Schedule an appointment as soon as possible for a visit in 1 week(s).   Specialties: Nurse Practitioner, Family Medicine Contact information: [address] [place] [address] [phone]                 Hospital course 66 year old F with PMH of COPD/chronic hypoxic RF on 3 to 5 L at baseline presenting with shortness of breath and cough for about a month, and admitted for COPD exacerbation.  CT chest showed extensive mucous plugging in the bilateral lower lung fields concerning for aspiration, and irregular solid nodule at RLL.  Pulmonology consulted.  Patient was started on systemic steroid, nebulizers, mucolytic's and antibiotics.  Eventually, symptoms improved.   On the day of discharge, she felt well and ready to go home.  She was ambulated on room air and maintain saturation above 89% that has improved to 90s with 2 L by nasal cannula.  She was cleared for discharge by pulmonology on prednisone taper.  She will be discharged on azithromycin for 2 more days to complete a total of 5 days course.  She is optimized on LABA/LAMA/ICS.  Of note, patient is on a research study for a phase 3 randomized double-blind, placebo controlled multicenter study on Astegolimab (an IL-33 binder) prior to hospitalization.  See individual problem list below for more.   Problems addressed during this  hospitalization Principal Problem:   COPD exacerbation (HCC) Active Problems:   Pulmonary hypertension (HCC)   Dyslipidemia   PVD (peripheral vascular disease) (HCC)   GERD without esophagitis   Depression   Peripheral neuropathy   Acute on chronic hypoxic respiratory failure due to COPD exacerbation, respiratory failure resolved.  CT angio chest negative for PE but extensive mucous plugging to bilateral lower lung fields concerning for aspiration.  Echocardiogram without significant finding. -Cleared for discharge on systemic steroid, antibiotics, inhalers and nebulizers.    Pulmonary nodule -Outpatient follow-up.   Pulmonary hypertension (HCC) -Continue Roflumilast.   Dyslipidemia -Continue ontinue statin therapy.   PVD with symptomatic carotid stenosis: -continue aspirin and Plavix.   Peripheral neuropathy -Continue home Neurontin   Depression -Continue home Lexapro   GERD without esophagitis -Continue PPI           Time spent 35 minutes  Vital signs Vitals:   05/12/23 2143 05/12/23 2313 05/13/23 0542 05/13/23 0911  BP: (!) 159/63  127/61   Pulse: 63  (!) 56   Temp: 98.7 F (37.1 C)  98.4 F (36.9 C)   Resp: 14  14   Weight:      SpO2: 98% 99% 100% 100%  TempSrc: Oral  Oral      Discharge exam  GENERAL: No apparent distress.  Nontoxic. HEENT: MMM.  Vision and hearing grossly intact.  NECK: Supple.  No apparent JVD.  RESP:  No IWOB.  Fair aeration bilaterally. CVS:  RRR. Heart sounds normal.  ABD/GI/GU: BS+. Abd soft, NTND.  MSK/EXT:  Moves extremities. No apparent  daily with a meal.   oxybutynin 10 MG 24 hr tablet Commonly known as: DITROPAN-XL Take 1 tablet (10 mg total) by mouth daily.   OXYGEN Inhale into the lungs. 3 liters at rest and 5 liters with any activity.-   pantoprazole 40 MG tablet Commonly known as: PROTONIX Take 1 tablet by mouth once daily   Potassium 99 MG Tabs Take 99 mg by mouth daily.   predniSONE 10 MG tablet Commonly known as: DELTASONE Take 4 tablets (40 mg total) by mouth daily for 3 days, THEN 3 tablets (30 mg total) daily for 3 days, THEN 2 tablets (20 mg total) daily for 3 days, THEN 1 tablet (10 mg total) daily for 3 days, THEN 0.5 tablets (5 mg total) daily for 3 days. Start taking on: May 13, 2023 What changed:  See the new instructions. Another medication with the same name was removed. Continue taking this medication, and follow the directions you see here.   roflumilast 500 MCG Tabs tablet Commonly known as: DALIRESP Take 1 tablet by mouth once daily   sodium chloride HYPERTONIC 3 % nebulizer  solution Take by nebulization 2 (two) times daily. What changed:  how much to take when to take this   Vitamin D-3 125 MCG (5000 UT) Tabs Take 5,000 Units by mouth every evening.        Consultations: Pulmonology  Procedures/Studies:   ECHOCARDIOGRAM COMPLETE  Result Date: 05/12/2023    ECHOCARDIOGRAM REPORT   Patient Name:   Maria [name] Date of Exam: 05/12/2023 Medical Rec #:  [id number]      Height:       65.5 in Accession #:    [id number]     Weight:       135.0 lb Date of Birth:  [birth year]       BSA:          1.683 m Patient Age:    67 years       BP:           129/64 mmHg Patient Gender: F              HR:           69 bpm. Exam Location:  Inpatient Procedure: 2D Echo, Cardiac Doppler and Color Doppler Indications:    R06.02 SOB. Acute respiratory failure.  History:        Patient has prior history of Echocardiogram examinations, most                 recent 04/27/2022. COPD, Signs/Symptoms:Syncope; Risk                 Factors:Hypertension, Dyslipidemia and Current Smoker.  Sonographer:    Sheralyn Boatman RDCS Referring Phys: [id number] MURALI RAMASWAMY IMPRESSIONS  1. Left ventricular ejection fraction, by estimation, is 60 to 65%. The left ventricle has normal function. The left ventricle has no regional wall motion abnormalities. Left ventricular diastolic parameters are indeterminate. Elevated left atrial pressure. The E/e' is 19.  2. Right ventricular systolic function is normal. The right ventricular size is normal.  3. The mitral valve is normal in structure. No evidence of mitral valve regurgitation. No evidence of mitral stenosis.  4. The aortic valve is grossly normal. Unable to determine aortic valve morphology due to image quality. Aortic valve regurgitation is not visualized. No aortic stenosis is present.  5. The inferior vena cava is normal in size with greater than 50% respiratory variability, suggesting right atrial pressure of 3

## 2023-05-14 LAB — LEGIONELLA PNEUMOPHILA SEROGP 1 UR AG: L. pneumophila Serogp 1 Ur Ag: NEGATIVE

## 2023-05-14 LAB — CULTURE, RESPIRATORY W GRAM STAIN

## 2023-05-15 ENCOUNTER — Ambulatory Visit: Payer: Medicare HMO | Admitting: Primary Care

## 2023-05-15 ENCOUNTER — Encounter: Payer: Self-pay | Admitting: Primary Care

## 2023-05-15 VITALS — BP 136/70 | HR 65 | Ht 65.5 in | Wt 132.2 lb

## 2023-05-15 DIAGNOSIS — J441 Chronic obstructive pulmonary disease with (acute) exacerbation: Secondary | ICD-10-CM

## 2023-05-15 DIAGNOSIS — R911 Solitary pulmonary nodule: Secondary | ICD-10-CM

## 2023-05-15 DIAGNOSIS — Z8709 Personal history of other diseases of the respiratory system: Secondary | ICD-10-CM | POA: Diagnosis not present

## 2023-05-15 DIAGNOSIS — J9611 Chronic respiratory failure with hypoxia: Secondary | ICD-10-CM | POA: Diagnosis not present

## 2023-05-15 MED ORDER — IPRATROPIUM-ALBUTEROL 0.5-2.5 (3) MG/3ML IN SOLN: RESPIRATORY_TRACT | 5 refills | Status: DC

## 2023-05-15 MED ORDER — SODIUM CHLORIDE 3 % IN NEBU: INHALATION_SOLUTION | Freq: Two times a day (BID) | RESPIRATORY_TRACT | 12 refills | Status: AC

## 2023-05-15 MED ORDER — ROFLUMILAST 500 MCG PO TABS: 500.0000 ug | ORAL_TABLET | Freq: Every day | ORAL | 11 refills | Status: DC

## 2023-05-15 NOTE — Progress Notes (Signed)
@Patient  ID: Maria [name], female    DOB: 05/01/1956, 67 y.o.   MRN: [id number]  Chief Complaint  Patient presents with   Acute Visit   Hospitalization Follow-up    Pt was in the ED 10/23 due to COPD. Pt states she can breathe better, still congested. Pt is still on prednisone and antibiotics.      Referring provider: Eden Emms, NP  HPI: 67 year old female, former smoker.  Medical history significant for COPD, chronic respiratory failure, bronchiectasis, positive ANA, emphysema, pulmonary hypertension, aortic arthrosclerosis, cervical fusion, lipidemia, history of DVT.  05/15/2023 Patient presents today for acute OV. She was admitted from 10/23-10/26 for COPD exacerbation. Completed zpack. Currently on prednisone and using Tussionex at bedtime for cough which has been helping. She is still having chest congestion. CTA 05/11/23 showed extensive mucus plugging, irregular solid nodule right lower lobe measuring 1.6 x 1.5 cm, unchanged when compared to recent lung cancer screening but suspicious for primary lung malignancy.  Maintained on Breztri and Daliresp daily. She has flutter valve. Uses 3L oxygen at rest and 5L with activity. Order PET CT in 6 weeks.  Allergies  Allergen Reactions   Montelukast     Bad dreams    Immunization History  Administered Date(s) Administered   Fluad Quad(high Dose 65+) 03/30/2022   Influenza, High Dose Seasonal PF 03/30/2022, 03/27/2023   Influenza, Seasonal, Injecte, Preservative Fre 04/28/2008, 04/09/2010   Influenza,inj,Quad PF,6+ Mos 04/22/2019, 04/25/2020, 03/15/2021   Influenza-Unspecified 06/01/2008, 05/10/2016, 03/18/2017   Moderna Sars-Covid-2 Vaccination 07/30/2019, 08/28/2019, 06/27/2020   PNEUMOCOCCAL CONJUGATE-20 07/06/2022   Pfizer Covid-19 Vaccine Bivalent Booster 11yrs & up 07/28/2021   Pneumococcal Polysaccharide-23 04/15/2015, 04/22/2020   RSV,unspecified 07/06/2022   Respiratory Syncytial Virus Vaccine,Recomb  Aduvanted(Arexvy) 05/30/2022   Td 10/01/2020   Tdap 11/17/2009, 03/27/2023   Zoster Recombinant(Shingrix) 04/16/2020, 06/22/2020    Past Medical History:  Diagnosis Date   Atherosclerosis 1/0612014   aorta, iliacs and CFA bilaterally no greater than 0-49% - lower arterial doppler.   Blood transfusion without reported diagnosis 2005   Cancer [facility]) Ovarian 1978   Chest pain    COPD (chronic obstructive pulmonary disease) (HCC)    COVID-19 long hauler manifesting chronic dyspnea    Emphysema of lung (HCC) 2019   Oxygen deficiency 03/2019   Oxygen dependent    3 liters up to 5    Tobacco History: Social History   Tobacco Use  Smoking Status Former   Current packs/day: 0.00   Average packs/day: 1 pack/day for 45.0 years (45.0 ttl pk-yrs)   Types: Cigarettes   Start date: 03/19/1971   Quit date: 03/18/2016   Years since quitting: 7.1   Passive exposure: Past  Smokeless Tobacco Never   Counseling given: Not Answered   Outpatient Medications Prior to Visit  Medication Sig Dispense Refill   albuterol (VENTOLIN HFA) 108 (90 Base) MCG/ACT inhaler TAKE 2 PUFFS BY MOUTH EVERY 6 HOURS AS NEEDED FOR WHEEZE OR SHORTNESS OF BREATH 8.5 each 5   ASPIRIN LOW DOSE 81 MG EC tablet TAKE 1 TABLET BY MOUTH EVERY DAY 90 tablet 3   atorvastatin (LIPITOR) 10 MG tablet Take 1 tablet (10 mg total) by mouth daily. 90 tablet 2   Azelastine HCl 137 MCG/SPRAY SOLN USE 1 SPRAY INTO EACH NOSTRIL TWICE A DAY 30 mL 1   azithromycin (ZITHROMAX) 250 MG tablet Take 1 tablet (250 mg total) by mouth daily for 2 days. 2 tablet 0   benzonatate (TESSALON) 200 MG capsule Take  1 capsule by mouth three times daily as needed 90 capsule 6   chlorpheniramine-HYDROcodone (TUSSIONEX) 10-8 MG/5ML Take 5 mLs by mouth every 12 (twelve) hours as needed for cough. 120 mL 0   Cholecalciferol (VITAMIN D-3) 125 MCG (5000 UT) TABS Take 5,000 Units by mouth every evening.     clopidogrel (PLAVIX) 75 MG tablet Take 1 tablet (75 mg  total) by mouth daily. 30 tablet 5   escitalopram (LEXAPRO) 20 MG tablet Take 1 tablet (20 mg total) by mouth daily. 90 tablet 1   gabapentin (NEURONTIN) 600 MG tablet Take 1 tablet (600 mg total) by mouth every 8 (eight) hours. 90 tablet 5   guaiFENesin (MUCINEX) 600 MG 12 hr tablet Take 1 tablet (600 mg total) by mouth 2 (two) times daily for 5 days. 10 tablet 0   hydrOXYzine (VISTARIL) 25 MG capsule TAKE 1 CAPSULE BY MOUTH AT BEDTIME AS NEEDED (Patient taking differently: Take 25 mg by mouth at bedtime as needed for anxiety.) 30 capsule 0   methocarbamol (ROBAXIN) 500 MG tablet Take 1 tablet (500 mg total) by mouth at bedtime as needed for muscle spasms. 15 tablet 0   midodrine (PROAMATINE) 10 MG tablet Take 1 tablet (10 mg total) by mouth 2 (two) times daily with a meal. 60 tablet 2   oxybutynin (DITROPAN-XL) 10 MG 24 hr tablet Take 1 tablet (10 mg total) by mouth daily. 30 tablet 11   OXYGEN Inhale into the lungs. 3 liters at rest and 5 liters with any activity.-     pantoprazole (PROTONIX) 40 MG tablet Take 1 tablet by mouth once daily 30 tablet 3   Potassium 99 MG TABS Take 99 mg by mouth daily.     predniSONE (DELTASONE) 10 MG tablet Take 4 tablets (40 mg total) by mouth daily for 3 days, THEN 3 tablets (30 mg total) daily for 3 days, THEN 2 tablets (20 mg total) daily for 3 days, THEN 1 tablet (10 mg total) daily for 3 days, THEN 0.5 tablets (5 mg total) daily for 3 days. 31.5 tablet 0   Respiratory Therapy Supplies (FLUTTER) DEVI Use as directed 1 each 0   Budeson-Glycopyrrol-Formoterol (BREZTRI AEROSPHERE) 160-9-4.8 MCG/ACT AERO Inhale 2 puffs into the lungs in the morning and at bedtime. 10.7 g 11   ipratropium-albuterol (DUONEB) 0.5-2.5 (3) MG/3ML SOLN USE 1 AMPULE IN NEBULIZER 4 TIMES A DAY AS NEEDED (DX: J84.112, J44.9) 360 mL 5   roflumilast (DALIRESP) 500 MCG TABS tablet Take 1 tablet by mouth once daily 30 tablet 0   sodium chloride HYPERTONIC 3 % nebulizer solution Take by  nebulization 2 (two) times daily. (Patient taking differently: Take 4 mLs by nebulization every evening.) 750 mL 12   Facility-Administered Medications Prior to Visit  Medication Dose Route Frequency Provider Last Rate Last Valerie Salts - ARNASA [id number] - astegolimab 238 mg/1.7 mL or placebo SQ injection (PI-Ramaswamy)  476 mg Subcutaneous Q14 Days        Review of Systems  Review of Systems  Constitutional: Negative.   HENT:  Positive for congestion.   Respiratory: Negative.    Cardiovascular: Negative.      Physical Exam  BP 136/70   Pulse 65   Ht 5' 5.5" (1.664 m)   Wt 132 lb 3.2 oz (60 kg)   SpO2 100%   BMI 21.66 kg/m  Physical Exam Constitutional:      Appearance: Normal appearance.  HENT:     Head: Normocephalic and atraumatic.  Cardiovascular:     Rate and Rhythm: Normal rate and regular rhythm.  Pulmonary:     Effort: Pulmonary effort is normal.     Breath sounds: Rhonchi present.  Skin:    General: Skin is warm and dry.  Neurological:     General: No focal deficit present.     Mental Status: She is alert and oriented to person, place, and time. Mental status is at baseline.  Psychiatric:        Mood and Affect: Mood normal.        Behavior: Behavior normal.        Thought Content: Thought content normal.        Judgment: Judgment normal.      Lab Results:  CBC    Component Value Date/Time   WBC 15.2 (H) 05/12/2023 0627   RBC 3.35 (L) 05/12/2023 0627   HGB 10.6 (L) 05/12/2023 0627   HGB 13.6 08/22/2019 1359   HCT 32.2 (L) 05/12/2023 0627   HCT 39.3 08/22/2019 1359   PLT 256 05/12/2023 0627   PLT 381 08/22/2019 1359   MCV 96.1 05/12/2023 0627   MCV 91 08/22/2019 1359   MCH 31.6 05/12/2023 0627   MCHC 32.9 05/12/2023 0627   RDW 14.4 05/12/2023 0627   RDW 12.5 08/22/2019 1359   LYMPHSABS 3.2 05/10/2023 1640   LYMPHSABS 2.5 08/22/2019 1359   MONOABS 0.6 05/10/2023 1640   EOSABS 0.1 05/10/2023 1640   EOSABS 0.0 08/22/2019 1359   BASOSABS 0.0  05/10/2023 1640   BASOSABS 0.0 08/22/2019 1359    BMET    Component Value Date/Time   NA 135 05/12/2023 0627   K 3.9 05/12/2023 0627   CL 101 05/12/2023 0627   CO2 24 05/12/2023 0627   GLUCOSE 95 05/12/2023 0627   BUN 17 05/12/2023 0627   CREATININE 0.63 05/12/2023 0627   CALCIUM 9.0 05/12/2023 0627   GFRNONAA >60 05/12/2023 0627   GFRAA >60 03/03/2019 0154    BNP    Component Value Date/Time   BNP 173.0 (H) 05/12/2023 0627    ProBNP    Component Value Date/Time   PROBNP 36.0 03/31/2022 1019    Imaging: ECHOCARDIOGRAM COMPLETE  Result Date: 05/12/2023    ECHOCARDIOGRAM REPORT   Patient Name:   MUSLIMA TOPPINS Date of Exam: 05/12/2023 Medical Rec #:  [id number]      Height:       65.5 in Accession #:    [id number]     Weight:       135.0 lb Date of Birth:  [birth year]       BSA:          1.683 m Patient Age:    67 years       BP:           129/64 mmHg Patient Gender: F              HR:           69 bpm. Exam Location:  Inpatient Procedure: 2D Echo, Cardiac Doppler and Color Doppler Indications:    R06.02 SOB. Acute respiratory failure.  History:        Patient has prior history of Echocardiogram examinations, most                 recent 04/27/2022. COPD, Signs/Symptoms:Syncope; Risk                 Factors:Hypertension, Dyslipidemia and Current Smoker.  Sonographer:    Inetta Fermo  [facility] RDCS Referring Phys: [id number] MURALI RAMASWAMY IMPRESSIONS  1. Left ventricular ejection fraction, by estimation, is 60 to 65%. The left ventricle has normal function. The left ventricle has no regional wall motion abnormalities. Left ventricular diastolic parameters are indeterminate. Elevated left atrial pressure. The E/e' is 19.  2. Right ventricular systolic function is normal. The right ventricular size is normal.  3. The mitral valve is normal in structure. No evidence of mitral valve regurgitation. No evidence of mitral stenosis.  4. The aortic valve is grossly normal. Unable to determine aortic valve  morphology due to image quality. Aortic valve regurgitation is not visualized. No aortic stenosis is present.  5. The inferior vena cava is normal in size with greater than 50% respiratory variability, suggesting right atrial pressure of 3 mmHg. Comparison(s): A prior study was performed on 04/27/2022. No significant change from prior study. FINDINGS  Left Ventricle: Left ventricular ejection fraction, by estimation, is 60 to 65%. The left ventricle has normal function. The left ventricle has no regional wall motion abnormalities. The left ventricular internal cavity size was normal in size. There is  no left ventricular hypertrophy. Left ventricular diastolic parameters are indeterminate. Elevated left atrial pressure. The E/e' is 61. Right Ventricle: The right ventricular size is normal. No increase in right ventricular wall thickness. Right ventricular systolic function is normal. Left Atrium: Left atrial size was normal in size. Right Atrium: Right atrial size was normal in size. Pericardium: There is no evidence of pericardial effusion. Mitral Valve: The mitral valve is normal in structure. No evidence of mitral valve regurgitation. No evidence of mitral valve stenosis. MV peak gradient, 10.4 mmHg. The mean mitral valve gradient is 4.0 mmHg. Tricuspid Valve: The tricuspid valve is normal in structure. Tricuspid valve regurgitation is not demonstrated. No evidence of tricuspid stenosis. Aortic Valve: The aortic valve is grossly normal. Aortic valve regurgitation is not visualized. No aortic stenosis is present. Pulmonic Valve: The pulmonic valve was not well visualized. Pulmonic valve regurgitation is not visualized. No evidence of pulmonic stenosis. Aorta: The aortic root and ascending aorta are structurally normal, with no evidence of dilitation. Venous: The inferior vena cava is normal in size with greater than 50% respiratory variability, suggesting right atrial pressure of 3 mmHg. IAS/Shunts: The atrial  septum is grossly normal.  LEFT VENTRICLE PLAX 2D LVIDd:         4.10 cm     Diastology LVIDs:         2.20 cm     LV e' medial:    6.20 cm/s LV PW:         0.90 cm     LV E/e' medial:  21.0 LV IVS:        0.80 cm     LV e' lateral:   7.94 cm/s LVOT diam:     2.10 cm     LV E/e' lateral: 16.4 LV SV:         96 LV SV Index:   57 LVOT Area:     3.46 cm  LV Volumes (MOD) LV vol d, MOD A2C: 67.3 ml LV vol d, MOD A4C: 63.1 ml LV vol s, MOD A2C: 11.5 ml LV vol s, MOD A4C: 11.2 ml LV SV MOD A2C:     55.8 ml LV SV MOD A4C:     63.1 ml LV SV MOD BP:      54.2 ml RIGHT VENTRICLE  IVC RV S prime:     13.70 cm/s  IVC diam: 1.95 cm TAPSE (M-mode): 1.8 cm LEFT ATRIUM             Index        RIGHT ATRIUM          Index LA diam:        3.30 cm 1.96 cm/m   RA Area:     9.66 cm LA Vol (A2C):   29.3 ml 17.41 ml/m  RA Volume:   18.10 ml 10.75 ml/m LA Vol (A4C):   18.8 ml 11.17 ml/m LA Biplane Vol: 23.6 ml 14.02 ml/m  AORTIC VALVE LVOT Vmax:   118.00 cm/s LVOT Vmean:  81.900 cm/s LVOT VTI:    0.276 m  AORTA Ao Root diam: 3.30 cm Ao Asc diam:  2.80 cm MITRAL VALVE MV Area (PHT): 3.17 cm     SHUNTS MV Area VTI:   2.16 cm     Systemic VTI:  0.28 m MV Peak grad:  10.4 mmHg    Systemic Diam: 2.10 cm MV Mean grad:  4.0 mmHg MV Vmax:       1.61 m/s MV Vmean:      95.6 cm/s MV Decel Time: 239 msec MV E velocity: 130.00 cm/s MV A velocity: 77.90 cm/s MV E/A ratio:  1.67 Sunit Tolia Electronically signed by Tessa Lerner Signature Date/Time: 05/12/2023/5:25:18 PM    Final    CT Angio Chest Pulmonary Embolism (PE) W or WO Contrast  Result Date: 05/11/2023 CLINICAL DATA:  COPD exacerbation EXAM: CT ANGIOGRAPHY CHEST WITH CONTRAST TECHNIQUE: Multidetector CT imaging of the chest was performed using the standard protocol during bolus administration of intravenous contrast. Multiplanar CT image reconstructions and MIPs were obtained to evaluate the vascular anatomy. RADIATION DOSE REDUCTION: This exam was performed according to  the departmental dose-optimization program which includes automated exposure control, adjustment of the mA and/or kV according to patient size and/or use of iterative reconstruction technique. CONTRAST:  75mL OMNIPAQUE IOHEXOL 350 MG/ML SOLN COMPARISON:  Lung cancer screening CT dated April 21, 2023 FINDINGS: Cardiovascular: No evidence of pulmonary embolus. Normal heart size. No pericardial effusion. Normal caliber aorta with moderate atherosclerotic disease. Mediastinum/Nodes: Esophagus and thyroid are unremarkable. No enlarged lymph nodes seen in the chest. Lungs/Pleura: Central airways are patent. Severe emphysema. Mucous plugging of the bilateral lower lobes and bronchial wall thickening. Irregular solid nodule of the right lower lobe measuring 1.6 x 1.5 cm on series 4, image 121, unchanged in size when compared with recent lung cancer screening CT. No pleural effusion or pneumothorax. Upper Abdomen: No acute abnormality. Musculoskeletal: No chest wall abnormality. No acute or significant osseous findings. Review of the MIP images confirms the above findings. IMPRESSION: 1. No evidence of pulmonary embolus. 2. Extensive mucous plugging of the bilateral lower lobes, likely due to aspiration. 3. Irregular solid nodule of the right lower lobe, unchanged when compared with the recent lung cancer screening CT and suspicious for primary lung malignancy. 4. Moderate coronary artery calcifications, aortic Atherosclerosis (ICD10-I70.0) and Emphysema (ICD10-J43.9). Electronically Signed   By: Allegra Lai M.D.   On: 05/11/2023 13:40   DG Chest 2 View  Result Date: 05/10/2023 CLINICAL DATA:  COPD exacerbation. EXAM: CHEST - 2 VIEW COMPARISON:  Radiograph 05/01/2023, CT 04/21/2023 FINDINGS: Chronic hyperinflation, emphysema and bronchial thickening. Resolved opacity at the right lung base. No new airspace disease. No pulmonary edema, pleural effusion, or pneumothorax. No acute osseous findings. IMPRESSION: 1.  Resolved opacity at  the right lung base. No new airspace disease. 2. Chronic hyperinflation, emphysema and bronchial thickening. Electronically Signed   By: Narda Rutherford M.D.   On: 05/10/2023 20:48   DG Chest 2 View  Result Date: 05/01/2023 CLINICAL DATA:  Follow-up pneumonia. EXAM: CHEST - 2 VIEW COMPARISON:  Chest two views 04/18/2023 and 02/15/2022; CT chest 04/20/2022 and 03/25/2022 FINDINGS: Cardiac silhouette and mediastinal contours are unchanged and within normal limits. There is again flattening of the diaphragms and moderate hyperinflation. Moderate lucencies throughout the bilateral lungs with upper lobe predominance, consistent with the moderate to high-grade centrilobular emphysematous changes seen on prior CT. There is subtle density overlying the right costophrenic angle which appears stable to minimally improved from 04/18/2023 and similar to 02/15/2022. Note is made focal consolidation was seen within the slightly more medial aspect of the posteroinferior right lung on 04/21/2023 CT. On lateral view density within the posterior inferior costophrenic angle appears minimally improved from 04/18/2023. No pleural effusion pneumothorax. ACDF hardware overlies the lower cervical spine. Mild-to-moderate multilevel degenerative disc changes of the thoracic spine. IMPRESSION: 1. Subtle density overlying the right costophrenic angle appears stable to minimally improved from 04/18/2023 and similar to 02/15/2022. Note is made focal consolidation was seen within the slightly more medial aspect of the posteroinferior right lung on 04/21/2023 CT. Overall no definite pneumonia is seen on radiographs, and the appearance is not significantly changed from baseline 02/15/2022 radiographs. 2. Moderate to high-grade centrilobular emphysematous changes. Electronically Signed   By: Neita Garnet M.D.   On: 05/01/2023 10:55     Assessment & Plan:   COPD exacerbation (HCC) - Admitted for AECOPD from  10/23-10/26, treated with zpack. Currently on prednisone taper. Continues to have chest congestion.  Recommend patient take Mucinex 1-2 times daily, use hypertonic saline nebulizer twice daily followed by flutter valve to loosen congestion. Continue Breztri Aerosphere 2 puffs twice daily, ipratropium-albuterol nebulizer every 4-6 hours as needed for shortness of breath and Daliresp daily. FU in 2 to 3 months with Dr. Marchelle Gearing for regular OV.  Lung nodule - CT chest in October showed an irregular solid nodule of the right lower lobe measuring 1.6 x 1.5cm, unchanged when compared with the recent lung cancer screening CT and suspicious for primary lung malignancy. Recommend getting PET CT in 6 weeks.   Chronic respiratory failure with hypoxia (HCC) - Continue 3L oxygen at rest and 5L with activity    Orders: - Continue prednisone taper until completed - Continue mucinex 1-2 twice a day - Use hypertonic saline twice daily (loosen congestion); followed by flutter valve - Ipratropium-albuterol every 4-6 hours  (for shortness of breath)  - Make sure you are taking Daliresp once daily - Continue Breztri Aerosphere two puffs twice daily   Orders: PET CT in 6 weeks    Follow-up: - 2- 3 months with Dr. Leavy Cella, NP 05/22/2023

## 2023-05-15 NOTE — Patient Instructions (Addendum)
Orders: - Continue prednisone taper until completed - Continue mucinex 1-2 twice a day - Use hypertonic saline twice daily (loosen congestion); followed by flutter valve - Ipratropium-albuterol every 4-6 hours  (for shortness of breath)  - Make sure you are taking Daliresp once daily - Continue Breztri Aerosphere two puffs twice daily   Orders: PET CT in 6 weeks    Follow-up: - 2- 3 months with Dr. Marchelle Gearing

## 2023-05-17 ENCOUNTER — Telehealth: Payer: Self-pay | Admitting: Acute Care

## 2023-05-17 NOTE — Telephone Encounter (Signed)
Spoke with [name] NP, she is aware of 06/26/23 PET scan and advised to have OV after to discuss results. Called and spoke with pt. Appt scheduled with SG on 12/13 to review PET. Pt verbalized understanding and denied any further questions or concerns at this time.

## 2023-05-18 ENCOUNTER — Telehealth: Payer: Self-pay | Admitting: Acute Care

## 2023-05-18 ENCOUNTER — Ambulatory Visit (HOSPITAL_COMMUNITY): Payer: Medicare HMO

## 2023-05-18 NOTE — Telephone Encounter (Signed)
I have called the patient and reviewed the results of her low dose Ct Chest. She was in the hospital with pneumonia, and infection/ inflammation is a possible reason for the 4B reading.She was seen in our office 10/28 as hospital follow up.  After touching base with Dr. Marchelle Gearing, plan is for PET scan which has been scheduled for 06/26/2023. We will review the scan and we will change patient to my schedule for follow up. If the scan is still concerning for malignancy, we can consider Nodify bloodwork , or see if radiation oncology would consider radiation without tissue sampling.   Sherre Lain and [place], please keep an eye out for the results of the PET scan scheduled for 06/26/23, and lets get her moved to my schedule for follow up after PET. Also, please fax results to PCP and let them know plan of care.  Thanks so much

## 2023-05-18 NOTE — Telephone Encounter (Signed)
Results/plan faxed to PCP 

## 2023-05-19 MED ORDER — BREZTRI AEROSPHERE 160-9-4.8 MCG/ACT IN AERO: 2.0000 | INHALATION_SPRAY | Freq: Two times a day (BID) | RESPIRATORY_TRACT | 3 refills | Status: DC

## 2023-05-19 NOTE — Addendum Note (Signed)
Addended by: Bonney Leitz on: 05/19/2023 05:47 PM   Modules accepted: Orders

## 2023-05-22 ENCOUNTER — Ambulatory Visit: Payer: Medicare HMO | Admitting: Student in an Organized Health Care Education/Training Program

## 2023-05-22 DIAGNOSIS — R911 Solitary pulmonary nodule: Secondary | ICD-10-CM | POA: Insufficient documentation

## 2023-05-22 NOTE — Assessment & Plan Note (Signed)
-   Continue 3L oxygen at rest and 5L with activity

## 2023-05-22 NOTE — Assessment & Plan Note (Signed)
-   Admitted for AECOPD from 10/23-10/26, treated with zpack. Currently on prednisone taper. Continues to have chest congestion.  Recommend patient take Mucinex 1-2 times daily, use hypertonic saline nebulizer twice daily followed by flutter valve to loosen congestion. Continue Breztri Aerosphere 2 puffs twice daily, ipratropium-albuterol nebulizer every 4-6 hours as needed for shortness of breath and Daliresp daily. FU in 2 to 3 months with Dr. Marchelle Gearing for regular OV.

## 2023-05-22 NOTE — Assessment & Plan Note (Signed)
-   CT chest in October showed an irregular solid nodule of the right lower lobe measuring 1.6 x 1.5cm, unchanged when compared with the recent lung cancer screening CT and suspicious for primary lung malignancy. Recommend getting PET CT in 6 weeks.

## 2023-05-24 ENCOUNTER — Ambulatory Visit: Payer: Medicare HMO | Admitting: Student in an Organized Health Care Education/Training Program

## 2023-05-24 ENCOUNTER — Encounter: Payer: Medicare HMO | Admitting: *Deleted

## 2023-05-24 DIAGNOSIS — J441 Chronic obstructive pulmonary disease with (acute) exacerbation: Secondary | ICD-10-CM

## 2023-05-24 DIAGNOSIS — R768 Other specified abnormal immunological findings in serum: Secondary | ICD-10-CM | POA: Diagnosis not present

## 2023-05-24 DIAGNOSIS — Z006 Encounter for examination for normal comparison and control in clinical research program: Secondary | ICD-10-CM

## 2023-05-24 MED ORDER — STUDY - ARNASA GB44332 - ASTEGOLIMAB 238 MG/1.7 ML OR PLACEBO SQ INJECTION (PI-RAMASWAMY)
476.0000 mg | INJECTION | SUBCUTANEOUS | Status: DC
  Administered 2023-05-24: 476 mg via SUBCUTANEOUS
  Filled 2023-05-24: qty 3.4

## 2023-05-24 NOTE — Research (Cosign Needed)
Title: A Phase III, randomized, double-blind, placebo controlled, multicenter study to evaluate the efficacy and safety of astegolimab in patients with chronic obstructive pulmonary disease    Dose and Duration of Treatment: Astegolimab is presented as a sterile, slightly brown-yellow solution. Each single-use, 2.25 mL pre-filled syringe contains 1.7 mL deliverable volume. Astegolimab drug product is formulated at 140 mg/mL astegolimab with 114 mM succinic acid, 200 mM L-arginine, 10 mM L-methionine, 0.06% (w/v) polysorbate 20, pH 5.7. Placebo for astegolimab is supplied in an identical pre-filled syringe configuration.   Protocol # [id number]   Sponsor: [employer] [address] [place]  Protocol: Version 3, dated 26/May2023, Version 4 dated 20Jun2023 IB: version 9.0 dated April 2024 ICF: Main version 12Apr2023, revised 17July2023 Mobile Nursing: 01Sep2023, revised 01Sep2023 Lab Manual: V4.0.0 20Oct2023    Investigator Brochure Product: Astegolimab [phone])   Mechanism of action: Astegolimab (also known as [id number] or [id number]) is a fully human, IgG2 monoclonal antibody that binds with high affinity to the interleukin (IL)-33 (IL-33) receptor, ST2, thereby blocking the signaling of IL-33, an inflammatory cytokine of the IL-1 family and member of the "alarmin" class of molecules. Astegolimab binds with high affinity to the human and cynomolgus monkey receptor for IL-33, ST2, and blocks IL-33 binding, thus inhibiting association with the IL-1R accessory protein (AcP) co-receptor and formation of an activated receptor complex.   Key Inclusion Criteria: Age 37-80 years at Visit 1   Documented COPD diagnosis for >=12 months prior to Visit 1 History of frequent exacerbations, defined as having had 2 or more moderate or severe COPD exacerbations within 12 months prior to Visit 1 Post-bronchodilator FEV1 >=20% and <80% of predicted at Visit 1 or Visit 2   Post-bronchodilator FEV1/FVC <0.70 at Visit 1 or Visit 2  mMRC score >=2 at screening   Current tobacco smoker or former smoker (having stopped smoking for at least 6 months prior to Visit 1) with a history of smoking >=10 pack-years On optimized COPD maintenance therapy as defined below for >=12 months prior to Visit 1            -Inhaled corticosteroid (ICS) plus long-acting beta-agonist (LABA)           -Long-acting muscarinic antagonist (LAMA) plus LABA           -ICS plus LAMA plus LABA  Key Exclusion Criteria: Current documented diagnosis of asthma  Diagnosis of a-1 antitrypsin deficiency  History of long-term treatment with oxygen at >4.0 liters/minute  Any infection that resulted in hospital admission for >=24 hours and/or treatment with oral, IV, or IM antibiotics within 4 weeks prior to Visit 1 or during screening Upper or lower respiratory tract infection within 4 weeks prior to Visit 1 or during screening Treatment with oral, IV, or IM corticosteroids (>10 mg/day prednisolone or equivalent) within 4 weeks prior to initiation of study drug Treatment with a licensed biologic agent (e.g., omalizumab, dupilumab, and/or anti-IL-5 therapies) within 3 months or 5 drug-elimination half-lives (whichever is longer) prior to screening  Planned surgical intervention during the study Known immunodeficiency, including but not limited to, HIV infection  AST, ALT, or total bilirubin elevation >=2.0 x the upper limit of normal (ULN) during screening  History of malignancy within 5 years prior to screening, with the exception of malignancies with a negligible risk of metastasis or death (e.g., 5-year overall survival rate >90%), such as adequately treated carcinoma in situ of the cervix, non-melanoma carcinoma, localized prostate cancer, or ductal carcinoma in situ  Unstable cardiac disease, myocardial infarction, or New York Heart Association Class III or IV heart failure within 12 months prior to  screening History or absence of an abnormal ECG that is deemed clinically significant by the investigator, including complete left bundle branch block or second- or third-degree atrioventricular heart block     Integrated Pharmacokinetic/Pharmacodynamic Analysis:  In Studies [id number], AV40981, and XB14782, exploratory biomarker analysis showed there were consistent decreases in blood eosinophil counts throughout the treatment period, potentially mediated by a direct effect of IL-33 on eosinophil progenitors. In [place], there was no significant difference in fractional exhaled nitric oxide (FeNO) levels (reflecting airway IL-4/IL-13 activity) between astegolimab-treated groups relative to placebo throughout the treatment period. These data suggest that astegolimab has only a limited effect on Type 2 inflammation in asthma. No data are applicable for Study NF62130. No data are available yet for Study [id number].  Special Warnings/Considerations: Administration of astegolimab, a protein therapeutic, may lead to the development of anti-astegolimab antibodies which could lead to AEs and/or decreased exposure. In non-clinical studies (see Section 4.2), ADA incidence has generally been low and there was no apparent ADA impact on PK and safety in these studies. To date, the immunogenicity rates observed with astegolimab in clinical studies have been relatively low as well (see Immunogenicity Section 5.6). Several clinical studies have been conducted in patients with asthma, atopic dermatitis, COPD (IIS Study NG29528) and COVID-19 severe pneumonia, which generally showed low incidence of ADAs (Table 31). There has been no correlation between ADA status and clinical findings or increased incidence of AEs. Astegolimab is now being considered in a larger study for the treatment of COPD. This patient population is typically considered to have a hyper-responsive immune system. Route of administration for this molecule  will be [place], either Q2W or Q4W. These factors increase the risk of development of an immune response to astegolimab, specifically with repeat dosing. From the previous IIS Study [id number], incidence of ADAs was low in COPD patients; this remains to be confirmed in a larger study. To monitor ADA development in ongoing studies, serum samples will be collected from patients at protocol-defined intervals. Patients who test positive for antibodies and have clinical sequelae that are considered potentially related to an ADA response may also be asked to return for additional follow-up testing.  Drug Interaction Studies: No PK drug interaction studies have been conducted to date.  Serious Adverse Reactions Observed in Asthma Study: During the treatment period, a similar proportion of patients across all cohorts experienced at least one AE, regardless of causality (Table 22). In total, 77.2%, 70.9%, 72.2%, and 72.1% of patients reported at least 1 AE in the placebo, 70 mg, 210 mg, and 490 mg groups, respectively. The most common AE's (>5% in any treatment group) were asthma, nasopharyngitis, upper respiratory tract infection, headache, and injection site reaction (ISR). The most common drug-related AE was ISR, which was reported more frequently in the astegolimab treatment groups than in the placebo group (1 [0.8%] patient in the placebo group, 10 [7.9%] patients with 70 mg, 8 [6.3%] patients with 210 mg, 6 [4.9%] patients with 490 mg). All ISRs were non-serious and mild or moderate in severity. During the treatment period, 50 SAEs were reported in 37 (7.4%) patients. The number of patients reporting SAEs was comparable across all cohorts (11 SAEs in 8 patients on placebo, 21 SAEs in 14 patients on 70 mg, 11 SAEs in 9 patients on 210 mg, and 7 SAEs in 6 patients on 490 mg). The  most common SAE was asthma. One SAE of moderate livedo reticularis (70 mg) was considered a suspected unexpected serious adverse drug reaction  (SUSAR) related to astegolimab and was reported two days after the second dose, leading to discontinuation of astegolimab. Two (0.4%) patients reported anaphylaxis and hypersensitivity reactions: 1 severe SAE of anaphylactic reaction (placebo), and 1 moderate hypersensitivity (490 mg) considered related to astegolimab. Three (0.6%) patients experienced a potential Major Adverse Cardiac Event (MACE) (1 patient each in the placebo, 70 mg, and 210 mg groups). None of the potential MACE were considered related to astegolimab. Overall, 233 (46.4%) patients reported events of infection. A comparable number of patients reported infection across all treatment groups (65 [51.2%] patients on placebo, 55 [43.3%] patients on 70 mg, 58 [46.0%] patients on 210 mg, and 55 [45.1%] patients on 490 mg). The most frequently reported infection (>=10% incidence) was nasopharyngitis (12.7%). One patient on astegolimab 210 mg and the partner of one patient on placebo became pregnant during the study. Both delivered normal/healthy babies. Two deaths, unrelated to study drug, were reported: one patient on 210 mg astegolimab died following an SAE of asthma; the other patient on 490 mg astegolimab had an unexplained death. There were no clinically meaningful changes in laboratory parameters, vital signs, or ECG results, other than the 10% decrease in mean blood eosinophil counts in the astegolimab-treated groups, with no safety concerns. Treatment-induced ADAs were comparable between the astegolimab groups and had no impact on safety. Overall, astegolimab was well tolerated at all doses used and had a safety profile consistent with that observed in the previous astegolimab Phase I studies.  Serious Adverse Reactions Observed in Previous COPD Study: In the completed IIS Study [id number], a total of 81 patients received at least one dose of astegolimab or placebo. The safety profile of astegolimab was similar to that of placebo. There were a  total of 222 AEs reported in 62 patients. A total of 28 (72%) patients in the placebo arm and 34 (81%) patients in the astegolimab arm reported at least one AE. The most commonly reported AEs were headache followed by urinary tract infection and viral upper respiratory tract infection. A total of 39 SAEs were reported in 28 (35%) patients; 16 (41%) patients in the placebo group and 12 (29%) in the astegolimab group. The most commonly reported SAE was hospital admission for community acquired pneumonia. Four SAEs resulted in patient discontinuation from treatment (1 patient in the placebo group and 3 patients in the astegolimab group). One patient in the placebo group experienced an AESI of potential MACE (heart failure, unrelated to the study treatment). No anaphylaxis or pregnancies were reported. Two deaths, unrelated to study treatment, were reported: 1 patient in the placebo group died after hospital acquired pneumonia, and another patient in the placebo group died after pneumonia and type 2 respiratory failure.  Safety Data: Astegolimab has been generally well tolerated. There have been 49 patient deaths across all astegolimab studies (Section 5.5.2), none of which were considered related to astegolimab. A total of 144 subjects experienced a total of 224 SAEs across all astegolimab studies. Of these, an SAE livedo reticularis observed in 1 patient was considered related to astegolimab by the investigator (Section 5.5.3). AEs leading to withdrawal (Section 5.5.4) have generally occurred at a rate similar to what would be expected for clinical trials in the studies' respective indications.   [facility] [place] Clinical Research Coordinator note :    This visit for Subject [id number] with DOB: 18 Feb 1956 on 24 May 2023 for the above protocol is Visit/Encounter # 19  and is for purpose of research.    Subject expressed continued interest and consent in continuing as a study subject. Subject confirmed that  there was no change in contact information (e.g. address, telephone, email). Subject thanked for participation in research and contribution to science.  The Subject was informed that the PI Dr. Marchelle Gearing continues to have oversight of the subject's visits and course  through relevant discussions, reviews and also specifically of this visit by routing of this note to the PI. All procedures and assessments were performed per above stated protocol. Subject tolerated injections well. Please refer to subjects paper source binder for further details of this visit.       Signed by Laurelyn Sickle Clinical Research Coordinator  [facility]  [place], [place]

## 2023-05-25 DIAGNOSIS — J449 Chronic obstructive pulmonary disease, unspecified: Secondary | ICD-10-CM | POA: Diagnosis not present

## 2023-05-25 DIAGNOSIS — J9611 Chronic respiratory failure with hypoxia: Secondary | ICD-10-CM | POA: Diagnosis not present

## 2023-05-29 ENCOUNTER — Ambulatory Visit: Payer: Medicare HMO | Admitting: Student in an Organized Health Care Education/Training Program

## 2023-05-30 ENCOUNTER — Other Ambulatory Visit (INDEPENDENT_AMBULATORY_CARE_PROVIDER_SITE_OTHER): Payer: Self-pay | Admitting: Nurse Practitioner

## 2023-05-30 NOTE — Telephone Encounter (Signed)
Patient should continue to get refills from PCP

## 2023-06-01 DIAGNOSIS — S92352S Displaced fracture of fifth metatarsal bone, left foot, sequela: Secondary | ICD-10-CM | POA: Diagnosis not present

## 2023-06-01 DIAGNOSIS — G894 Chronic pain syndrome: Secondary | ICD-10-CM | POA: Diagnosis not present

## 2023-06-01 DIAGNOSIS — Q761 Klippel-Feil syndrome: Secondary | ICD-10-CM | POA: Diagnosis not present

## 2023-06-01 DIAGNOSIS — M5010 Cervical disc disorder with radiculopathy, unspecified cervical region: Secondary | ICD-10-CM | POA: Diagnosis not present

## 2023-06-01 DIAGNOSIS — M25511 Pain in right shoulder: Secondary | ICD-10-CM | POA: Diagnosis not present

## 2023-06-06 ENCOUNTER — Other Ambulatory Visit: Payer: Self-pay | Admitting: Student in an Organized Health Care Education/Training Program

## 2023-06-06 ENCOUNTER — Other Ambulatory Visit: Payer: Self-pay | Admitting: Nurse Practitioner

## 2023-06-06 ENCOUNTER — Ambulatory Visit (INDEPENDENT_AMBULATORY_CARE_PROVIDER_SITE_OTHER): Payer: Medicare HMO | Admitting: Nurse Practitioner

## 2023-06-06 ENCOUNTER — Encounter: Payer: Self-pay | Admitting: Nurse Practitioner

## 2023-06-06 VITALS — BP 136/80 | HR 80 | Temp 98.4°F | Ht 65.5 in | Wt 136.2 lb

## 2023-06-06 DIAGNOSIS — N611 Abscess of the breast and nipple: Secondary | ICD-10-CM | POA: Diagnosis not present

## 2023-06-06 DIAGNOSIS — G8929 Other chronic pain: Secondary | ICD-10-CM

## 2023-06-06 DIAGNOSIS — I959 Hypotension, unspecified: Secondary | ICD-10-CM | POA: Diagnosis not present

## 2023-06-06 DIAGNOSIS — G479 Sleep disorder, unspecified: Secondary | ICD-10-CM

## 2023-06-06 DIAGNOSIS — M5010 Cervical disc disorder with radiculopathy, unspecified cervical region: Secondary | ICD-10-CM

## 2023-06-06 DIAGNOSIS — I6522 Occlusion and stenosis of left carotid artery: Secondary | ICD-10-CM

## 2023-06-06 DIAGNOSIS — G894 Chronic pain syndrome: Secondary | ICD-10-CM

## 2023-06-06 DIAGNOSIS — Q761 Klippel-Feil syndrome: Secondary | ICD-10-CM

## 2023-06-06 MED ORDER — CLOPIDOGREL BISULFATE 75 MG PO TABS: 75.0000 mg | ORAL_TABLET | Freq: Every day | ORAL | 3 refills | Status: DC

## 2023-06-06 MED ORDER — SULFAMETHOXAZOLE-TRIMETHOPRIM 800-160 MG PO TABS: 1.0000 | ORAL_TABLET | Freq: Two times a day (BID) | ORAL | 0 refills | Status: AC

## 2023-06-06 MED ORDER — MIDODRINE HCL 5 MG PO TABS
ORAL_TABLET | ORAL | 0 refills | Status: AC
Start: 2023-06-06 — End: 2023-07-04

## 2023-06-06 NOTE — Assessment & Plan Note (Signed)
Treat with Bactrim DS 1 tab twice daily for 5 days.  Follow-up if no improvement patient's mammogram is up-to-date

## 2023-06-06 NOTE — Progress Notes (Signed)
Acute Office Visit  Subjective:     Patient ID: Maria [name], female    DOB: [birth year], 67 y.o.   MRN: [id number]  Chief Complaint  Patient presents with   Breast Pain    Pt complains of painful boil on the tip of left nipple. States that there was a little blood that came out this morning. States hurts more when she moves. 4 to 8 pain level.    medication managaement    Pt complains of being told that in order to get her Plavix and ProAmatine filled she has to see PCP.     HPI Patient is in today for breast complaint with a history of ovarain cancer, COPD, Oxygen dependent, breast biopsy, suspicious lung nodule.  Last mammogram 03/28/2023 that was diagnostic with clip placement. Pathology showed benign breast parenchyma with stromal fibrosis   States that she noticed it during the night on Friday of last week. States that she turned over and woke her up with pain. States that since then it has not gotten better. But this morning she noticed some blood on the clothes. States that it does look swollen   Patient reached out to [facility] to get a refill on plavix and midodrine. She was directed to me to have those medications refilled. She was place on both after an attmpet to do caroitd stenting that was unsuccessful. She does have the   Review of Systems  Constitutional:  Negative for chills and fever.  Respiratory:  Positive for shortness of breath (baseline).   Cardiovascular:  Negative for chest pain.  Neurological:  Negative for headaches.  Psychiatric/Behavioral:  Negative for hallucinations and suicidal ideas.         Objective:    BP 136/80   Pulse 80   Temp 98.4 F (36.9 C) (Oral)   Ht 5' 5.5" (1.664 m)   Wt 136 lb 3.2 oz (61.8 kg)   SpO2 97%   BMI 22.32 kg/m    Physical Exam Vitals and nursing note reviewed. Exam conducted with a chaperone present [employer]).  Constitutional:      Appearance: Normal appearance.  Cardiovascular:     Rate and  Rhythm: Normal rate and regular rhythm.     Heart sounds: Normal heart sounds.  Pulmonary:     Effort: Pulmonary effort is normal.     Comments: Decreased globally. On chronic oxygen  Chest:    Lymphadenopathy:     Cervical: No cervical adenopathy.  Neurological:     Mental Status: She is alert.     No results found for any visits on 06/06/23.      Assessment & Plan:   Problem List Items Addressed This Visit       Cardiovascular and Mediastinum   Carotid stenosis    History of same with failed intervention and stenting.  Patient placed on clopidogrel thereafter will continue clopidogrel.  Refill sent in today      Relevant Medications   midodrine (PROAMATINE) 5 MG tablet   clopidogrel (PLAVIX) 75 MG tablet   Transient hypotension    Patient had issues with hypotension while in the ICU postsurgical procedure was placed on midodrine thereafter patient taking 10 mg twice daily does have ability to check blood pressure at home we will wean her down over the next 4 weeks starting with 5 mg twice daily for 2 weeks then 2.5 mg twice daily for 2 weeks checking blood pressure at home follow-up with me in 4 weeks  Relevant Medications   midodrine (PROAMATINE) 5 MG tablet     Other   Abscess of breast - Primary    Treat with Bactrim DS 1 tab twice daily for 5 days.  Follow-up if no improvement patient's mammogram is up-to-date      Relevant Medications   sulfamethoxazole-trimethoprim (BACTRIM DS) 800-160 MG tablet    Meds ordered this encounter  Medications   midodrine (PROAMATINE) 5 MG tablet    Sig: Take 1 tablet (5 mg total) by mouth 2 (two) times daily with a meal for 14 days, THEN 0.5 tablets (2.5 mg total) 2 (two) times daily with a meal for 14 days.    Dispense:  42 tablet    Refill:  0    Order Specific Question:   Supervising Provider    Answer:   Roxy Manns A [[id number]]   clopidogrel (PLAVIX) 75 MG tablet    Sig: Take 1 tablet (75 mg total) by mouth daily.     Dispense:  90 tablet    Refill:  3    Order Specific Question:   Supervising Provider    Answer:   Roxy Manns A [[id number]]   sulfamethoxazole-trimethoprim (BACTRIM DS) 800-160 MG tablet    Sig: Take 1 tablet by mouth 2 (two) times daily for 5 days.    Dispense:  10 tablet    Refill:  0    Order Specific Question:   Supervising Provider    Answer:   Roxy Manns A [[id number]]    Return in about 4 weeks (around 07/04/2023) for BP recheck.  Audria Nine, NP

## 2023-06-06 NOTE — Assessment & Plan Note (Signed)
Patient had issues with hypotension while in the ICU postsurgical procedure was placed on midodrine thereafter patient taking 10 mg twice daily does have ability to check blood pressure at home we will wean her down over the next 4 weeks starting with 5 mg twice daily for 2 weeks then 2.5 mg twice daily for 2 weeks checking blood pressure at home follow-up with me in 4 weeks

## 2023-06-06 NOTE — Patient Instructions (Signed)
Nice to see you today I have sent in several medications to the pharmacy I want to see you in 1 month to check blood pressure Check it at home several times a week

## 2023-06-06 NOTE — Assessment & Plan Note (Signed)
History of same with failed intervention and stenting.  Patient placed on clopidogrel thereafter will continue clopidogrel.  Refill sent in today

## 2023-06-07 ENCOUNTER — Encounter (INDEPENDENT_AMBULATORY_CARE_PROVIDER_SITE_OTHER): Payer: Medicare Other | Admitting: Adult Health

## 2023-06-07 ENCOUNTER — Encounter: Payer: Self-pay | Admitting: Adult Health

## 2023-06-07 ENCOUNTER — Encounter: Payer: Medicare HMO | Admitting: *Deleted

## 2023-06-07 DIAGNOSIS — Z006 Encounter for examination for normal comparison and control in clinical research program: Secondary | ICD-10-CM

## 2023-06-07 DIAGNOSIS — J441 Chronic obstructive pulmonary disease with (acute) exacerbation: Secondary | ICD-10-CM

## 2023-06-07 MED ORDER — STUDY - ARNASA GB44332 - ASTEGOLIMAB 238 MG/1.7 ML OR PLACEBO SQ INJECTION (PI-RAMASWAMY)
476.0000 mg | INJECTION | SUBCUTANEOUS | Status: DC
  Administered 2023-06-07: 476 mg via SUBCUTANEOUS
  Filled 2023-06-07: qty 3.4

## 2023-06-07 NOTE — Research (Cosign Needed)
Title: A Phase III, randomized, double-blind, placebo controlled, multicenter study to evaluate the efficacy and safety of astegolimab in patients with chronic obstructive pulmonary disease    Dose and Duration of Treatment: Astegolimab is presented as a sterile, slightly brown-yellow solution. Each single-use, 2.25 mL pre-filled syringe contains 1.7 mL deliverable volume. Astegolimab drug product is formulated at 140 mg/mL astegolimab with 114 mM succinic acid, 200 mM L-arginine, 10 mM L-methionine, 0.06% (w/v) polysorbate 20, pH 5.7. Placebo for astegolimab is supplied in an identical pre-filled syringe configuration.   Protocol # [id number]   Sponsor: [employer] [address] [place]   Protocol: Version 3, dated 26/May2023, Version 4 dated 20Jun2023 IB: version 9.0 dated April 2024 ICF: Main version 12Apr2023, revised 17July2023 Mobile Nursing: 01Sep2023, revised 01Sep2023 Lab Manual: V4.0.0 20Oct2023   Mechanism of action: Astegolimab (also known as [id number] or [id number]) is a fully human, IgG2 monoclonal antibody that binds with high affinity to the interleukin (IL)-33 (IL-33) receptor, ST2, thereby blocking the signaling of IL-33, an inflammatory cytokine of the IL-1 family and member of the "alarmin" class of molecules. Astegolimab binds with high affinity to the human and cynomolgus monkey receptor for IL-33, ST2, and blocks IL-33 binding, thus inhibiting association with the IL-1R accessory protein (AcP) co-receptor and formation of an activated receptor complex.      Integrated Pharmacokinetic/Pharmacodynamic Analysis:  In Studies [id number], [id number], and [id number], exploratory biomarker analysis showed there were consistent decreases in blood eosinophil counts throughout the treatment period, potentially mediated by a direct effect of IL-33 on eosinophil progenitors. In [place], there was no significant difference in fractional exhaled nitric oxide  (FeNO) levels (reflecting airway IL-4/IL-13 activity) between astegolimab-treated groups relative to placebo throughout the treatment period. These data suggest that astegolimab has only a limited effect on Type 2 inflammation in asthma. No data are applicable for Study [id number]. No data are available yet for Study [id number].  Special Warnings/Considerations: Administration of astegolimab, a protein therapeutic, may lead to the development of anti-astegolimab antibodies which could lead to AEs and/or decreased exposure. In non-clinical studies (see Section 4.2), ADA incidence has generally been low and there was no apparent ADA impact on PK and safety in these studies. To date, the immunogenicity rates observed with astegolimab in clinical studies have been relatively low as well (see Immunogenicity Section 5.6). Several clinical studies have been conducted in patients with asthma, atopic dermatitis, COPD (IIS Study [id number]) and COVID-19 severe pneumonia, which generally showed low incidence of ADAs (Table 31). There has been no correlation between ADA status and clinical findings or increased incidence of AEs. Astegolimab is now being considered in a larger study for the treatment of COPD. This patient population is typically considered to have a hyper-responsive immune system. Route of administration for this molecule will be [place], either Q2W or Q4W. These factors increase the risk of development of an immune response to astegolimab, specifically with repeat dosing. From the previous IIS Study [id number], incidence of ADAs was low in COPD patients; this remains to be confirmed in a larger study. To monitor ADA development in ongoing studies, serum samples will be collected from patients at protocol-defined intervals. Patients who test positive for antibodies and have clinical sequelae that are considered potentially related to an ADA response may also be asked to return for additional follow-up testing.  Drug  Interaction Studies: No PK drug interaction studies have been conducted to date.  Serious Adverse Reactions Observed in Asthma Study: During the treatment period,  a similar proportion of patients across all cohorts experienced at least one AE, regardless of causality (Table 22). In total, 77.2%, 70.9%, 72.2%, and 72.1% of patients reported at least 1 AE in the placebo, 70 mg, 210 mg, and 490 mg groups, respectively. The most common AE's (>5% in any treatment group) were asthma, nasopharyngitis, upper respiratory tract infection, headache, and injection site reaction (ISR). The most common drug-related AE was ISR, which was reported more frequently in the astegolimab treatment groups than in the placebo group (1 [0.8%] patient in the placebo group, 10 [7.9%] patients with 70 mg, 8 [6.3%] patients with 210 mg, 6 [4.9%] patients with 490 mg). All ISRs were non-serious and mild or moderate in severity. During the treatment period, 50 SAEs were reported in 37 (7.4%) patients. The number of patients reporting SAEs was comparable across all cohorts (11 SAEs in 8 patients on placebo, 21 SAEs in 14 patients on 70 mg, 11 SAEs in 9 patients on 210 mg, and 7 SAEs in 6 patients on 490 mg). The most common SAE was asthma. One SAE of moderate livedo reticularis (70 mg) was considered a suspected unexpected serious adverse drug reaction (SUSAR) related to astegolimab and was reported two days after the second dose, leading to discontinuation of astegolimab. Two (0.4%) patients reported anaphylaxis and hypersensitivity reactions: 1 severe SAE of anaphylactic reaction (placebo), and 1 moderate hypersensitivity (490 mg) considered related to astegolimab. Three (0.6%) patients experienced a potential Major Adverse Cardiac Event (MACE) (1 patient each in the placebo, 70 mg, and 210 mg groups). None of the potential MACE were considered related to astegolimab. Overall, 233 (46.4%) patients reported events of infection. A comparable  number of patients reported infection across all treatment groups (65 [51.2%] patients on placebo, 55 [43.3%] patients on 70 mg, 58 [46.0%] patients on 210 mg, and 55 [45.1%] patients on 490 mg). The most frequently reported infection (>=10% incidence) was nasopharyngitis (12.7%). One patient on astegolimab 210 mg and the partner of one patient on placebo became pregnant during the study. Both delivered normal/healthy babies. Two deaths, unrelated to study drug, were reported: one patient on 210 mg astegolimab died following an SAE of asthma; the other patient on 490 mg astegolimab had an unexplained death. There were no clinically meaningful changes in laboratory parameters, vital signs, or ECG results, other than the 10% decrease in mean blood eosinophil counts in the astegolimab-treated groups, with no safety concerns. Treatment-induced ADAs were comparable between the astegolimab groups and had no impact on safety. Overall, astegolimab was well tolerated at all doses used and had a safety profile consistent with that observed in the previous astegolimab Phase I studies.  Serious Adverse Reactions Observed in Previous COPD Study: In the completed IIS Study [id number], a total of 81 patients received at least one dose of astegolimab or placebo. The safety profile of astegolimab was similar to that of placebo. There were a total of 222 AEs reported in 62 patients. A total of 28 (72%) patients in the placebo arm and 34 (81%) patients in the astegolimab arm reported at least one AE. The most commonly reported AEs were headache followed by urinary tract infection and viral upper respiratory tract infection. A total of 39 SAEs were reported in 28 (35%) patients; 16 (41%) patients in the placebo group and 12 (29%) in the astegolimab group. The most commonly reported SAE was hospital admission for community acquired pneumonia. Four SAEs resulted in patient discontinuation from treatment (1 patient in the placebo group  and 3 patients in the astegolimab group). One patient in the placebo group experienced an AESI of potential MACE (heart failure, unrelated to the study treatment). No anaphylaxis or pregnancies were reported. Two deaths, unrelated to study treatment, were reported: 1 patient in the placebo group died after hospital acquired pneumonia, and another patient in the placebo group died after pneumonia and type 2 respiratory failure.  Safety Data: Astegolimab has been generally well tolerated. There have been 49 patient deaths across all astegolimab studies (Section 5.5.2), none of which were considered related to astegolimab. A total of 144 subjects experienced a total of 224 SAEs across all astegolimab studies. Of these, an SAE livedo reticularis observed in 1 patient was considered related to astegolimab by the investigator (Section 5.5.3). AEs leading to withdrawal (Section 5.5.4) have generally occurred at a rate similar to what would be expected for clinical trials in the studies' respective indications.   [facility] [place] Clinical Research Coordinator note :    This visit for Subject [id number] with DOB: 18 Feb 1956 on 07 Jun 2023 for the above protocol is Visit/Encounter #  week 36 and is for purpose of research.    Subject expressed continued interest and consent in continuing as a study subject. Subject confirmed that there was no change in contact information (e.g. address, telephone, email). Subject thanked for participation in research and contribution to science.  In this visit week 36 the subject will be evaluated by Oryon Gary Parrett,NP This research coordinator has verified that the above investigator is up to date with his/her training logs.    The Subject was informed that the PI Dr. Marchelle Gearing continues to have oversight of the subject's visits and course  through relevant discussions, reviews and also specifically of this visit by routing of this note to the PI. [facility] [place] Clinical  Research Coordinator note:   This visit for Subject GRACLYNN QUINNELL with DOB: [birth year] on 06/07/2023 for the above protocol is Visit/Encounter # week 36  and is for purpose of research.   Subject  expressed continued interest and consent in continuing as a study subject. Subject confirmed that there was  no change in contact information (e.g. address, telephone, email). Subject thanked for participation in research and contribution to science. In this visit 06/07/2023 the subject will be evaluated by Collin Hendley Parrett,NP  Sub-Investigator.. This research coordinator has verified that the above investigator is yes, up to date with his/her training logs.   The Subject was yes, informed that the PI Murali Ramaswamy,MD continues to have oversight of the subject's visits and course through relevant discussions, reviews, and also specifically of this visit by routing of this note to the PI.  All assessments and procedures were performed per above stated protocol. Subject tolerated injections well. Please refer to subjects paper source binder for further details of this visit.   Signed by  Laurelyn Sickle  Clinical Research Coordinator  [facility]  [place], [place] 12:53 PM 06/07/2023

## 2023-06-07 NOTE — Assessment & Plan Note (Signed)
Continue study protocol .   

## 2023-06-07 NOTE — Progress Notes (Signed)
@Patient  ID: Maria [name], female    DOB: 21-Jun-1956, 67 y.o.   MRN: [id number]  Chief Complaint  Patient presents with   Research    Referring provider: Eden Emms, NP  HPI: 67 year old female former smoker followed for COPD with emphysema and chronic respiratory failure  Title: A Phase III, randomized, double-blind, placebo controlled, multicenter study to evaluate the efficacy and safety of astegolimab in patients with chronic obstructive pulmonary disease    Dose and Duration of Treatment: Astegolimab is presented as a sterile, slightly brown-yellow solution. Each single-use, 2.25 mL pre-filled syringe contains 1.7 mL deliverable volume. Astegolimab drug product is formulated at 140 mg/mL astegolimab with 114 mM succinic acid, 200 mM L-arginine, 10 mM L-methionine, 0.06% (w/v) polysorbate 20, pH 5.7. Placebo for astegolimab is supplied in an identical pre-filled syringe configuration.     Mechanism of action: Astegolimab (also known as [id number] or [id number]) is a fully human, IgG2 monoclonal antibody that binds with high affinity to the interleukin (IL)-33 (IL-33) receptor, ST2, thereby blocking the signaling of IL-33, an inflammatory cytokine of the IL-1 family and member of the "alarmin" class of molecules. Astegolimab binds with high affinity to the human and cynomolgus monkey receptor for IL-33, ST2, and blocks IL-33 binding, thus inhibiting association with the IL-1R accessory protein (AcP) co-receptor and formation of an activated receptor complex.   Key Inclusion Criteria: Age 49-80 years at Visit 1   Documented COPD diagnosis for >=12 months prior to Visit 1 History of frequent exacerbations, defined as having had 2 or more moderate or severe COPD exacerbations within 12 months prior to Visit 1 Post-bronchodilator FEV1 >=20% and <80% of predicted at Visit 1 or Visit 2  Post-bronchodilator FEV1/FVC <0.70 at Visit 1 or Visit 2  mMRC score >=2 at screening   Current  tobacco smoker or former smoker (having stopped smoking for at least 6 months prior to Visit 1) with a history of smoking >=10 pack-years On optimized COPD maintenance therapy as defined below for >=12 months prior to Visit 1            -Inhaled corticosteroid (ICS) plus long-acting beta-agonist (LABA)           -Long-acting muscarinic antagonist (LAMA) plus LABA           -ICS plus LAMA plus LABA  Key Exclusion Criteria: Current documented diagnosis of asthma  Diagnosis of a-1 antitrypsin deficiency  History of long-term treatment with oxygen at >4.0 liters/minute  Any infection that resulted in hospital admission for >=24 hours and/or treatment with oral, IV, or IM antibiotics within 4 weeks prior to Visit 1 or during screening Upper or lower respiratory tract infection within 4 weeks prior to Visit 1 or during screening Treatment with oral, IV, or IM corticosteroids (>10 mg/day prednisolone or equivalent) within 4 weeks prior to initiation of study drug Treatment with a licensed biologic agent (e.g., omalizumab, dupilumab, and/or anti-IL-5 therapies) within 3 months or 5 drug-elimination half-lives (whichever is longer) prior to screening  Planned surgical intervention during the study Known immunodeficiency, including but not limited to, HIV infection  AST, ALT, or total bilirubin elevation >=2.0 x the upper limit of normal (ULN) during screening  History of malignancy within 5 years prior to screening, with the exception of malignancies with a negligible risk of metastasis or death (e.g., 5-year overall survival rate >90%), such as adequately treated carcinoma in situ of the cervix, non-melanoma carcinoma, localized prostate cancer, or ductal carcinoma in situ Unstable cardiac  disease, myocardial infarction, or New York Heart Association Class III or IV heart failure within 12 months prior to screening History or absence of an abnormal ECG that is deemed clinically significant by the  investigator, including complete left bundle branch block or second- or third-degree atrioventricular heart block     Integrated Pharmacokinetic/Pharmacodynamic Analysis:  In Studies [id number], [id number], and [id number], exploratory biomarker analysis showed there were consistent decreases in blood eosinophil counts throughout the treatment period, potentially mediated by a direct effect of IL-33 on eosinophil progenitors. In [place], there was no significant difference in fractional exhaled nitric oxide (FeNO) levels (reflecting airway IL-4/IL-13 activity) between astegolimab-treated groups relative to placebo throughout the treatment period. These data suggest that astegolimab has only a limited effect on Type 2 inflammation in asthma. No data are applicable for Study [id number]. No data are available yet for Study [id number].  Special Warnings/Considerations: Administration of astegolimab, a protein therapeutic, may lead to the development of anti-astegolimab antibodies which could lead to AEs and/or decreased exposure. In non-clinical studies (see Section 4.2), ADA incidence has generally been low and there was no apparent ADA impact on PK and safety in these studies. To date, the immunogenicity rates observed with astegolimab in clinical studies have been relatively low as well (see Immunogenicity Section 5.6). Several clinical studies have been conducted in patients with asthma, atopic dermatitis, COPD (IIS Study [id number]) and COVID-19 severe pneumonia, which generally showed low incidence of ADAs (Table 31). There has been no correlation between ADA status and clinical findings or increased incidence of AEs. Astegolimab is now being considered in a larger study for the treatment of COPD. This patient population is typically considered to have a hyper-responsive immune system. Route of administration for this molecule will be [place], either Q2W or Q4W. These factors increase the risk of development of an immune  response to astegolimab, specifically with repeat dosing. From the previous IIS Study [id number], incidence of ADAs was low in COPD patients; this remains to be confirmed in a larger study. To monitor ADA development in ongoing studies, serum samples will be collected from patients at protocol-defined intervals. Patients who test positive for antibodies and have clinical sequelae that are considered potentially related to an ADA response may also be asked to return for additional follow-up testing.  Drug Interaction Studies: No PK drug interaction studies have been conducted to date.  Serious Adverse Reactions Observed in Asthma Study: During the treatment period, a similar proportion of patients across all cohorts experienced at least one AE, regardless of causality (Table 22). In total, 77.2%, 70.9%, 72.2%, and 72.1% of patients reported at least 1 AE in the placebo, 70 mg, 210 mg, and 490 mg groups, respectively. The most common AE's (>5% in any treatment group) were asthma, nasopharyngitis, upper respiratory tract infection, headache, and injection site reaction (ISR). The most common drug-related AE was ISR, which was reported more frequently in the astegolimab treatment groups than in the placebo group (1 [0.8%] patient in the placebo group, 10 [7.9%] patients with 70 mg, 8 [6.3%] patients with 210 mg, 6 [4.9%] patients with 490 mg). All ISRs were non-serious and mild or moderate in severity. During the treatment period, 50 SAEs were reported in 37 (7.4%) patients. The number of patients reporting SAEs was comparable across all cohorts (11 SAEs in 8 patients on placebo, 21 SAEs in 14 patients on 70 mg, 11 SAEs in 9 patients on 210 mg, and 7 SAEs in 6 patients on 490 mg). The most common  SAE was asthma. One SAE of moderate livedo reticularis (70 mg) was considered a suspected unexpected serious adverse drug reaction (SUSAR) related to astegolimab and was reported two days after the second dose, leading to  discontinuation of astegolimab. Two (0.4%) patients reported anaphylaxis and hypersensitivity reactions: 1 severe SAE of anaphylactic reaction (placebo), and 1 moderate hypersensitivity (490 mg) considered related to astegolimab. Three (0.6%) patients experienced a potential Major Adverse Cardiac Event (MACE) (1 patient each in the placebo, 70 mg, and 210 mg groups). None of the potential MACE were considered related to astegolimab. Overall, 233 (46.4%) patients reported events of infection. A comparable number of patients reported infection across all treatment groups (65 [51.2%] patients on placebo, 55 [43.3%] patients on 70 mg, 58 [46.0%] patients on 210 mg, and 55 [45.1%] patients on 490 mg). The most frequently reported infection (>=10% incidence) was nasopharyngitis (12.7%). One patient on astegolimab 210 mg and the partner of one patient on placebo became pregnant during the study. Both delivered normal/healthy babies. Two deaths, unrelated to study drug, were reported: one patient on 210 mg astegolimab died following an SAE of asthma; the other patient on 490 mg astegolimab had an unexplained death. There were no clinically meaningful changes in laboratory parameters, vital signs, or ECG results, other than the 10% decrease in mean blood eosinophil counts in the astegolimab-treated groups, with no safety concerns. Treatment-induced ADAs were comparable between the astegolimab groups and had no impact on safety. Overall, astegolimab was well tolerated at all doses used and had a safety profile consistent with that observed in the previous astegolimab Phase I studies.  Serious Adverse Reactions Observed in Previous COPD Study: In the completed IIS Study [id number], a total of 81 patients received at least one dose of astegolimab or placebo. The safety profile of astegolimab was similar to that of placebo. There were a total of 222 AEs reported in 62 patients. A total of 28 (72%) patients in the placebo arm  and 34 (81%) patients in the astegolimab arm reported at least one AE. The most commonly reported AEs were headache followed by urinary tract infection and viral upper respiratory tract infection. A total of 39 SAEs were reported in 28 (35%) patients; 16 (41%) patients in the placebo group and 12 (29%) in the astegolimab group. The most commonly reported SAE was hospital admission for community acquired pneumonia. Four SAEs resulted in patient discontinuation from treatment (1 patient in the placebo group and 3 patients in the astegolimab group). One patient in the placebo group experienced an AESI of potential MACE (heart failure, unrelated to the study treatment). No anaphylaxis or pregnancies were reported. Two deaths, unrelated to study treatment, were reported: 1 patient in the placebo group died after hospital acquired pneumonia, and another patient in the placebo group died after pneumonia and type 2 respiratory failure.  Safety Data: Astegolimab has been generally well tolerated. There have been 49 patient deaths across all astegolimab studies (Section 5.5.2), none of which were considered related to astegolimab. A total of 144 subjects experienced a total of 224 SAEs across all astegolimab studies. Of these, an SAE livedo reticularis observed in 1 patient was considered related to astegolimab by the investigator (Section 5.5.3). AEs leading to withdrawal (Section 5.5.4) have generally occurred at a rate similar to what would be expected for clinical trials in the studies' respective indications.    06/07/2023 Research Visit  This visit for Subject Maria [name] with DOB: [birth year] on 06/07/2023 for the above protocol  and is for purpose of research  . Subject/LAR expressed continued interest and consent in continuing as a study subject. Subject thanked for participation in research and contribution to science.  Patient says that she was recently treated for left breast skin infection.  AE noted per  research protocol.       Allergies  Allergen Reactions   Montelukast     Bad dreams    Immunization History  Administered Date(s) Administered   Fluad Quad(high Dose 65+) 03/30/2022   Influenza, High Dose Seasonal PF 03/30/2022, 03/27/2023   Influenza, Seasonal, Injecte, Preservative Fre 04/28/2008, 04/09/2010   Influenza,inj,Quad PF,6+ Mos 04/22/2019, 04/25/2020, 03/15/2021   Influenza-Unspecified 06/01/2008, 05/10/2016, 03/18/2017   Moderna Sars-Covid-2 Vaccination 07/30/2019, 08/28/2019, 06/27/2020   PNEUMOCOCCAL CONJUGATE-20 07/06/2022   Pfizer Covid-19 Vaccine Bivalent Booster 20yrs & up 07/28/2021   Pneumococcal Polysaccharide-23 04/15/2015, 04/22/2020   RSV,unspecified 07/06/2022   Respiratory Syncytial Virus Vaccine,Recomb Aduvanted(Arexvy) 05/30/2022   Td 10/01/2020   Tdap 11/17/2009, 03/27/2023   Zoster Recombinant(Shingrix) 04/16/2020, 06/22/2020    Past Medical History:  Diagnosis Date   Atherosclerosis 1/0612014   aorta, iliacs and CFA bilaterally no greater than 0-49% - lower arterial doppler.   Blood transfusion without reported diagnosis 2005   Cancer [facility]) Ovarian 1978   Chest pain    COPD (chronic obstructive pulmonary disease) (HCC)    COVID-19 long hauler manifesting chronic dyspnea    Emphysema of lung (HCC) 2019   Oxygen deficiency 03/2019   Oxygen dependent    3 liters up to 5    Tobacco History: Social History   Tobacco Use  Smoking Status Former   Current packs/day: 0.00   Average packs/day: 1 pack/day for 45.0 years (45.0 ttl pk-yrs)   Types: Cigarettes   Start date: 03/19/1971   Quit date: 03/18/2016   Years since quitting: 7.2   Passive exposure: Past  Smokeless Tobacco Never   Counseling given: Not Answered   Outpatient Medications Prior to Visit  Medication Sig Dispense Refill   albuterol (VENTOLIN HFA) 108 (90 Base) MCG/ACT inhaler TAKE 2 PUFFS BY MOUTH EVERY 6 HOURS AS NEEDED FOR WHEEZE OR SHORTNESS OF BREATH 8.5 each 5    ASPIRIN LOW DOSE 81 MG EC tablet TAKE 1 TABLET BY MOUTH EVERY DAY 90 tablet 3   atorvastatin (LIPITOR) 10 MG tablet Take 1 tablet (10 mg total) by mouth daily. 90 tablet 2   Azelastine HCl 137 MCG/SPRAY SOLN USE 1 SPRAY INTO EACH NOSTRIL TWICE A DAY 30 mL 1   benzonatate (TESSALON) 200 MG capsule Take 1 capsule by mouth three times daily as needed 90 capsule 6   BOOSTRIX 5-2.5-18.5 LF-MCG/0.5 injection      Budeson-Glycopyrrol-Formoterol (BREZTRI AEROSPHERE) 160-9-4.8 MCG/ACT AERO Inhale 2 puffs into the lungs in the morning and at bedtime. 3 each 3   Cholecalciferol (VITAMIN D-3) 125 MCG (5000 UT) TABS Take 5,000 Units by mouth every evening.     clopidogrel (PLAVIX) 75 MG tablet Take 1 tablet (75 mg total) by mouth daily. 90 tablet 3   escitalopram (LEXAPRO) 20 MG tablet Take 1 tablet (20 mg total) by mouth daily. 90 tablet 1   FLUZONE HIGH-DOSE 0.5 ML injection      gabapentin (NEURONTIN) 600 MG tablet Take 1 tablet (600 mg total) by mouth every 8 (eight) hours. 90 tablet 5   hydrOXYzine (VISTARIL) 25 MG capsule TAKE 1 CAPSULE BY MOUTH AT BEDTIME AS NEEDED (Patient taking differently: Take 25 mg by mouth at bedtime as needed for anxiety.)  30 capsule 0   ipratropium-albuterol (DUONEB) 0.5-2.5 (3) MG/3ML SOLN USE 1 AMPULE IN NEBULIZER 4 TIMES A DAY AS NEEDED    (Dx: J84.112, J44.9) 360 mL 5   methocarbamol (ROBAXIN) 500 MG tablet Take 1 tablet (500 mg total) by mouth at bedtime as needed for muscle spasms. 15 tablet 0   midodrine (PROAMATINE) 5 MG tablet Take 1 tablet (5 mg total) by mouth 2 (two) times daily with a meal for 14 days, THEN 0.5 tablets (2.5 mg total) 2 (two) times daily with a meal for 14 days. 42 tablet 0   oxybutynin (DITROPAN-XL) 10 MG 24 hr tablet Take 1 tablet (10 mg total) by mouth daily. 30 tablet 11   OXYGEN Inhale into the lungs. 3 liters at rest and 5 liters with any activity.-     pantoprazole (PROTONIX) 40 MG tablet Take 1 tablet by mouth once daily 30 tablet 3    Potassium 99 MG TABS Take 99 mg by mouth daily.     Respiratory Therapy Supplies (FLUTTER) DEVI Use as directed 1 each 0   roflumilast (DALIRESP) 500 MCG TABS tablet Take 1 tablet (500 mcg total) by mouth daily. 30 tablet 11   sodium chloride HYPERTONIC 3 % nebulizer solution Take by nebulization 2 (two) times daily. 750 mL 12   sulfamethoxazole-trimethoprim (BACTRIM DS) 800-160 MG tablet Take 1 tablet by mouth 2 (two) times daily for 5 days. 10 tablet 0   Facility-Administered Medications Prior to Visit  Medication Dose Route Frequency Provider Last Rate Last Valerie Salts - ARNASA [id number] - astegolimab 238 mg/1.7 mL or placebo SQ injection (PI-Ramaswamy)  476 mg Subcutaneous Q14 Days        Study - ARNASA [id number] - astegolimab 238 mg/1.7 mL or placebo SQ injection (PI-Ramaswamy)  476 mg Subcutaneous Q14 Days    476 mg at 06/07/23 1202   Exam -see research notes    Lab Results:  CBC     Study - ARNASA [id number] - astegolimab 238 mg/1.7 mL or placebo SQ injection (PI-Ramaswamy)     Date Action Dose Route User   Discharged on 05/13/2023   Admitted on 05/10/2023   04/12/2023 0936 Given 476 mg Subcutaneous (Abdominal Tissue) Pryor Curia, CMA      Study - ARNASA [id number] - astegolimab 238 mg/1.7 mL or placebo SQ injection (PI-Ramaswamy)     Date Action Dose Route User   Discharged on 05/13/2023   Admitted on 05/10/2023   04/26/2023 1142 Given 476 mg Subcutaneous (Abdominal Tissue) Pryor Curia, CMA      Study - ARNASA [id number] - astegolimab 238 mg/1.7 mL or placebo SQ injection (PI-Ramaswamy)     Date Action Dose Route User   05/24/2023 1350 Given 476 mg Subcutaneous (Abdominal Tissue) Pryor Curia, CMA      Study - ARNASA [id number] - astegolimab 238 mg/1.7 mL or placebo SQ injection (PI-Ramaswamy)     Date Action Dose Route User   06/07/2023 1202 Given 476 mg Subcutaneous (Abdominal Tissue) Pryor Curia, CMA          Latest Ref Rng & Units 08/29/2022    8:29 AM  11/12/2020   10:43 AM 01/16/2020   10:57 AM 06/24/2019    8:45 AM  PFT Results  FVC-Pre L 2.79  2.94  3.17  2.93   FVC-Predicted Pre % 83  84  90  83   FVC-Post L  3.18  3.25  3.39   FVC-Predicted Post %  91  92  96   Pre FEV1/FVC % % 57  54  55  56   Post FEV1/FCV % %  54  56  59   FEV1-Pre L 1.60  1.60  1.75  1.64   FEV1-Predicted Pre % 62  59  65  60   FEV1-Post L  1.71  1.83  1.99   DLCO uncorrected ml/min/mmHg 9.85  8.06  10.81  12.24   DLCO UNC% % 47  37  50  57   DLCO corrected ml/min/mmHg 9.85  8.06  11.29    DLCO COR %Predicted % 47  37  52    DLVA Predicted % 50  57  61  60   TLC L  4.89  5.25  5.81   TLC % Predicted %  91  98  108   RV % Predicted %  88  86  110     No results found for: "NITRICOXIDE"      Assessment & Plan:   Research study patient Continue study protocol     Rubye Oaks, NP 06/07/2023

## 2023-06-19 ENCOUNTER — Encounter: Payer: Self-pay | Admitting: Urology

## 2023-06-19 ENCOUNTER — Ambulatory Visit: Payer: Medicare HMO | Admitting: Urology

## 2023-06-19 VITALS — BP 125/75 | HR 66 | Ht 65.5 in

## 2023-06-19 DIAGNOSIS — N3946 Mixed incontinence: Secondary | ICD-10-CM

## 2023-06-19 LAB — URINALYSIS, COMPLETE
Bilirubin, UA: NEGATIVE
Glucose, UA: NEGATIVE
Ketones, UA: NEGATIVE
Leukocytes,UA: NEGATIVE
Nitrite, UA: NEGATIVE
Protein,UA: NEGATIVE
Specific Gravity, UA: 1.02 (ref 1.005–1.030)
Urobilinogen, Ur: 0.2 mg/dL (ref 0.2–1.0)
pH, UA: 5.5 (ref 5.0–7.5)

## 2023-06-19 LAB — MICROSCOPIC EXAMINATION: Epithelial Cells (non renal): >10 /[HPF] — AB (ref 0–10)

## 2023-06-19 MED ORDER — OXYBUTYNIN CHLORIDE ER 10 MG PO TB24: 10.0000 mg | ORAL_TABLET | Freq: Every day | ORAL | 3 refills | Status: AC

## 2023-06-19 NOTE — Progress Notes (Signed)
06/19/2023 10:32 AM   Maria [name] 1955-10-29 [id number]  Referring provider: Eden Emms, NP [address] [place],  [place] [address]  Chief Complaint  Patient presents with   Follow-up   Urinary Incontinence    1 year follow-up    HPI: Was consulted to assess the patient's urinary incontinence.  She has urge incontinence and can use 7 heavy pads a day that are quite wet.  Sometimes she has bedwetting.  She has moderate stress incontinence with coughing sneezing.  She voids every 60 to 90 minutes and not all of 2 hours.  She gets up to 3 times a night   Sometimes she has intermittent burning at her recent urine culture was negative  She has COPD and lung damage and home oxygen from COVID.  She has had a hysterectomy.  She uses a cane     Bladder neck demonstrated mild hypermobility.  No stress incontinence with moderate cough.  Had a high small grade 1 cystocele.  Patient could not lay flat because of her breathing   Patient has mixed incontinence.  She has medical comorbidities due to her lungs and ability.  I think it is reasonable to hold off on urodynamics currently. Patient failed Myrbetriq   Cystoscopy normal.   Patient will be treated with oxybutynin ER 10 mg 3x11.  I may try 1 more medication after this.  No Gemtesa samples today.  Reassess in 6 weeks.  Percutaneous tibial nerve stimulation or in office Botox are considerations.   Today Urgency incontinence persisting.  Yesterday she felt discomfort with foot on the floor syndrome.  I went over percutaneous tibial nerve stimulation and gave a handout.     We gave Gemtesa samples.  I gave an urgent PC handout.  We will get a urine culture make sure she does not have an infection.  She will call in 1 month and start percutaneous tibial nerve stimulation if medication does not help    Today Frequency stable.  Last culture negative Patient still has urge and cannot insulin back on oxybutynin.  She just fractured  her foot and it is healing in a boot.  Still on home oxygen and now in a scooter because the left foot.  Wants to pursue percutaneous tibial nerve stimulation.  Clinically not infected  renew oxybutynin. Start percutaneous tibial nerve stimulation in approximately 3 months   Today I last saw the patient in November 2023.  She did some percutaneous tibial nerve stimulation. Patient doing much better on percutaneous tibial nerve stimulation and oxybutynin.  Reduce incontinence.  No infections still on home oxygen since 2020          PMH: Past Medical History:  Diagnosis Date   Atherosclerosis 1/0612014   aorta, iliacs and CFA bilaterally no greater than 0-49% - lower arterial doppler.   Blood transfusion without reported diagnosis 2005   Cancer [facility]) Ovarian 1978   Chest pain    COPD (chronic obstructive pulmonary disease) (HCC)    COVID-19 long hauler manifesting chronic dyspnea    Emphysema of lung (HCC) 2019   Oxygen deficiency 03/2019   Oxygen dependent    3 liters up to 5    Surgical History: Past Surgical History:  Procedure Laterality Date   ABDOMINAL HYSTERECTOMY  1978   BREAST BIOPSY Left 04/04/2023   Stereo bx, X clip, path pending   BREAST BIOPSY Left 04/04/2023   MM LT BREAST BX W LOC DEV 1ST LESION IMAGE BX  SPEC STEREO GUIDE 04/04/2023 [facility]   CAROTID PTA/STENT INTERVENTION Left 03/07/2023   Procedure: CAROTID PTA/STENT INTERVENTION;  Surgeon: Renford Dills, MD;  Location: [facility] INVASIVE CV LAB;  Service: Cardiovascular;  Laterality: Left;   CHOLECYSTECTOMY  2010   COLONOSCOPY     COLONOSCOPY     COLONOSCOPY WITH PROPOFOL N/A 11/01/2021   Procedure: COLONOSCOPY WITH PROPOFOL;  Surgeon: Wyline Mood, MD;  Location: [facility] ENDOSCOPY;  Service: Gastroenterology;  Laterality: N/A;   ESOPHAGOGASTRODUODENOSCOPY     NECK SURGERY  1999   OVARIAN CYST REMOVAL  1988   SPINE SURGERY  Neck 2002    Home Medications:  Allergies as of 06/19/2023        Reactions   Montelukast    Bad dreams        Medication List        Accurate as of June 19, 2023 10:32 AM. If you have any questions, ask your nurse or doctor.          STOP taking these medications    Boostrix 5-2.5-18.5 LF-MCG/0.5 injection Generic drug: Tdap Stopped by: Lorin Picket A Steve Gregg   Fluzone High-Dose 0.5 ML injection Generic drug: Influenza vac split quadrivalent PF Stopped by: Lorin Picket A Rolene Andrades       TAKE these medications    albuterol 108 (90 Base) MCG/ACT inhaler Commonly known as: VENTOLIN HFA TAKE 2 PUFFS BY MOUTH EVERY 6 HOURS AS NEEDED FOR WHEEZE OR SHORTNESS OF BREATH   Aspirin Low Dose 81 MG tablet Generic drug: aspirin EC TAKE 1 TABLET BY MOUTH EVERY DAY   atorvastatin 10 MG tablet Commonly known as: LIPITOR Take 1 tablet (10 mg total) by mouth daily.   Azelastine HCl 137 MCG/SPRAY Soln USE 1 SPRAY INTO EACH NOSTRIL TWICE A DAY   benzonatate 200 MG capsule Commonly known as: TESSALON Take 1 capsule by mouth three times daily as needed   Breztri Aerosphere 160-9-4.8 MCG/ACT Aero Generic drug: Budeson-Glycopyrrol-Formoterol Inhale 2 puffs into the lungs in the morning and at bedtime.   clopidogrel 75 MG tablet Commonly known as: Plavix Take 1 tablet (75 mg total) by mouth daily.   escitalopram 20 MG tablet Commonly known as: Lexapro Take 1 tablet (20 mg total) by mouth daily.   Flutter Devi Use as directed   gabapentin 600 MG tablet Commonly known as: Neurontin Take 1 tablet (600 mg total) by mouth every 8 (eight) hours.   hydrOXYzine 25 MG capsule Commonly known as: VISTARIL TAKE 1 CAPSULE BY MOUTH AT BEDTIME AS NEEDED   ipratropium-albuterol 0.5-2.5 (3) MG/3ML Soln Commonly known as: DUONEB USE 1 AMPULE IN NEBULIZER 4 TIMES A DAY AS NEEDED    (Dx: J84.112, J44.9)   methocarbamol 500 MG tablet Commonly known as: ROBAXIN Take 1 tablet (500 mg total) by mouth at bedtime as needed for muscle spasms.   midodrine 5  MG tablet Commonly known as: PROAMATINE Take 1 tablet (5 mg total) by mouth 2 (two) times daily with a meal for 14 days, THEN 0.5 tablets (2.5 mg total) 2 (two) times daily with a meal for 14 days. Start taking on: June 06, 2023   oxybutynin 10 MG 24 hr tablet Commonly known as: DITROPAN-XL Take 1 tablet (10 mg total) by mouth daily.   OXYGEN Inhale into the lungs. 3 liters at rest and 5 liters with any activity.-   pantoprazole 40 MG tablet Commonly known as: PROTONIX Take 1 tablet by mouth once daily   Potassium 99 MG Tabs Take 99 mg  by mouth daily.   roflumilast 500 MCG Tabs tablet Commonly known as: DALIRESP Take 1 tablet (500 mcg total) by mouth daily.   sodium chloride HYPERTONIC 3 % nebulizer solution Take by nebulization 2 (two) times daily.   Vitamin D-3 125 MCG (5000 UT) Tabs Take 5,000 Units by mouth every evening.        Allergies:  Allergies  Allergen Reactions   Montelukast     Bad dreams    Family History: Family History  Problem Relation Age of Onset   Heart failure Mother    Diabetes Mother    Kidney disease Mother    Hypertension Mother    Arthritis Mother    Heart disease Mother    Obesity Mother    COPD Father    Hearing loss Father    Lung cancer Brother    COPD Brother    COPD Brother    Asthma Son    Diabetes Son    Birth defects Son    Breast cancer Maternal Aunt 40    Social History:  reports that she quit smoking about 7 years ago. Her smoking use included cigarettes. She started smoking about 52 years ago. She has a 45 pack-year smoking history. She has been exposed to tobacco smoke. She has never used smokeless tobacco. She reports that she does not drink alcohol and does not use drugs.  ROS:                                        Physical Exam: There were no vitals taken for this visit.  Constitutional:  Alert and oriented, No acute distress. HEENT: [place] AT, moist mucus membranes.  Trachea  midline, no masses.   Laboratory Data: Lab Results  Component Value Date   WBC 15.2 (H) 05/12/2023   HGB 10.6 (L) 05/12/2023   HCT 32.2 (L) 05/12/2023   MCV 96.1 05/12/2023   PLT 256 05/12/2023    Lab Results  Component Value Date   CREATININE 0.63 05/12/2023    No results found for: "PSA"  No results found for: "TESTOSTERONE"  Lab Results  Component Value Date   HGBA1C 5.7 03/01/2023    Urinalysis    Component Value Date/Time   APPEARANCEUR Clear 05/31/2021 0910   GLUCOSEU Negative 05/31/2021 0910   BILIRUBINUR Negative 05/31/2021 0910   PROTEINUR Negative 05/31/2021 0910   UROBILINOGEN 0.2 03/15/2021 1147   NITRITE Negative 05/31/2021 0910   LEUKOCYTESUR Negative 05/31/2021 0910    Pertinent Imaging:   Assessment & Plan: Renew oxybutynin 90 x 3.  Continue with percutaneous tibial nerve stimulation.  Reassess in 1 year  1. Mixed incontinence  - Urinalysis, Complete   No follow-ups on file.  Martina Sinner, MD  [facility] [address], [address] [place], [place] [address] [phone]

## 2023-06-21 ENCOUNTER — Encounter: Payer: Medicare HMO | Admitting: *Deleted

## 2023-06-21 ENCOUNTER — Telehealth: Payer: Self-pay

## 2023-06-21 DIAGNOSIS — J441 Chronic obstructive pulmonary disease with (acute) exacerbation: Secondary | ICD-10-CM

## 2023-06-21 DIAGNOSIS — Z006 Encounter for examination for normal comparison and control in clinical research program: Secondary | ICD-10-CM

## 2023-06-21 MED ORDER — STUDY - ARNASA GB44332 - ASTEGOLIMAB 238 MG/1.7 ML OR PLACEBO SQ INJECTION (PI-RAMASWAMY)
476.0000 mg | INJECTION | SUBCUTANEOUS | Status: DC
  Administered 2023-06-21: 476 mg via SUBCUTANEOUS
  Filled 2023-06-21: qty 3.4

## 2023-06-21 NOTE — Telephone Encounter (Signed)
Call insurance 07/19/2023 to make sure PTNS monthly treatments do not need PA or change in coverage Has an appointment on 07/20/23

## 2023-06-21 NOTE — Research (Signed)
Title: A Phase III, randomized, double-blind, placebo controlled, multicenter study to evaluate the efficacy and safety of astegolimab in patients with chronic obstructive pulmonary disease    Dose and Duration of Treatment: Astegolimab is presented as a sterile, slightly brown-yellow solution. Each single-use, 2.25 mL pre-filled syringe contains 1.7 mL deliverable volume. Astegolimab drug product is formulated at 140 mg/mL astegolimab with 114 mM succinic acid, 200 mM L-arginine, 10 mM L-methionine, 0.06% (w/v) polysorbate 20, pH 5.7. Placebo for astegolimab is supplied in an identical pre-filled syringe configuration.   Protocol # [id number]   Sponsor: [employer] [address], [place] Protocol: Version 3, dated 26/May2023, Version 4 dated 20Jun2023 IB: version 9.0 dated April 2024 ICF: Main version 12Apr2023, revised 17July2023 Mobile Nursing: 01Sep2023, revised 01Sep2023 Lab Manual: V4.0.0 20Oct2023   Mechanism of action: Astegolimab (also known as [id number] or [id number]) is a fully human, IgG2 monoclonal antibody that binds with high affinity to the interleukin (IL)-33 (IL-33) receptor, ST2, thereby blocking the signaling of IL-33, an inflammatory cytokine of the IL-1 family and member of the "alarmin" class of molecules. Astegolimab binds with high affinity to the human and cynomolgus monkey receptor for IL-33, ST2, and blocks IL-33 binding, thus inhibiting association with the IL-1R accessory protein (AcP) co-receptor and formation of an activated receptor complex.   Key Inclusion Criteria: Age 28-80 years at Visit 1   Documented COPD diagnosis for >=12 months prior to Visit 1 History of frequent exacerbations, defined as having had 2 or more moderate or severe COPD exacerbations within 12 months prior to Visit 1 Post-bronchodilator FEV1 >=20% and <80% of predicted at Visit 1 or Visit 2  Post-bronchodilator FEV1/FVC <0.70 at Visit 1 or Visit 2  mMRC  score >=2 at screening   Current tobacco smoker or former smoker (having stopped smoking for at least 6 months prior to Visit 1) with a history of smoking >=10 pack-years On optimized COPD maintenance therapy as defined below for >=12 months prior to Visit 1            -Inhaled corticosteroid (ICS) plus long-acting beta-agonist (LABA)           -Long-acting muscarinic antagonist (LAMA) plus LABA           -ICS plus LAMA plus LABA  Key Exclusion Criteria: Current documented diagnosis of asthma  Diagnosis of a-1 antitrypsin deficiency  History of long-term treatment with oxygen at >4.0 liters/minute  Any infection that resulted in hospital admission for >=24 hours and/or treatment with oral, IV, or IM antibiotics within 4 weeks prior to Visit 1 or during screening Upper or lower respiratory tract infection within 4 weeks prior to Visit 1 or during screening Treatment with oral, IV, or IM corticosteroids (>10 mg/day prednisolone or equivalent) within 4 weeks prior to initiation of study drug Treatment with a licensed biologic agent (e.g., omalizumab, dupilumab, and/or anti-IL-5 therapies) within 3 months or 5 drug-elimination half-lives (whichever is longer) prior to screening  Planned surgical intervention during the study Known immunodeficiency, including but not limited to, HIV infection  AST, ALT, or total bilirubin elevation >=2.0 x the upper limit of normal (ULN) during screening  History of malignancy within 5 years prior to screening, with the exception of malignancies with a negligible risk of metastasis or death (e.g., 5-year overall survival rate >90%), such as adequately treated carcinoma in situ of the cervix, non-melanoma carcinoma, localized prostate cancer, or ductal carcinoma in situ Unstable cardiac disease, myocardial infarction, or [place] Heart  Association Class III or IV heart failure within 12 months prior to screening History or absence of an abnormal ECG that is deemed  clinically significant by the investigator, including complete left bundle branch block or second- or third-degree atrioventricular heart block     Integrated Pharmacokinetic/Pharmacodynamic Analysis:  In Studies ON62952, [id number], and [id number], exploratory biomarker analysis showed there were consistent decreases in blood eosinophil counts throughout the treatment period, potentially mediated by a direct effect of IL-33 on eosinophil progenitors. In [place], there was no significant difference in fractional exhaled nitric oxide (FeNO) levels (reflecting airway IL-4/IL-13 activity) between astegolimab-treated groups relative to placebo throughout the treatment period. These data suggest that astegolimab has only a limited effect on Type 2 inflammation in asthma. No data are applicable for Study [id number]. No data are available yet for Study [id number].  Special Warnings/Considerations: Administration of astegolimab, a protein therapeutic, may lead to the development of anti-astegolimab antibodies which could lead to AEs and/or decreased exposure. In non-clinical studies (see Section 4.2), ADA incidence has generally been low and there was no apparent ADA impact on PK and safety in these studies. To date, the immunogenicity rates observed with astegolimab in clinical studies have been relatively low as well (see Immunogenicity Section 5.6). Several clinical studies have been conducted in patients with asthma, atopic dermatitis, COPD (IIS Study GL87564) and COVID-19 severe pneumonia, which generally showed low incidence of ADAs (Table 31). There has been no correlation between ADA status and clinical findings or increased incidence of AEs. Astegolimab is now being considered in a larger study for the treatment of COPD. This patient population is typically considered to have a hyper-responsive immune system. Route of administration for this molecule will be [place], either Q2W or Q4W. These factors increase the risk of  development of an immune response to astegolimab, specifically with repeat dosing. From the previous IIS Study PP29518, incidence of ADAs was low in COPD patients; this remains to be confirmed in a larger study. To monitor ADA development in ongoing studies, serum samples will be collected from patients at protocol-defined intervals. Patients who test positive for antibodies and have clinical sequelae that are considered potentially related to an ADA response may also be asked to return for additional follow-up testing.  Drug Interaction Studies: No PK drug interaction studies have been conducted to date.  Serious Adverse Reactions Observed in Asthma Study: During the treatment period, a similar proportion of patients across all cohorts experienced at least one AE, regardless of causality (Table 22). In total, 77.2%, 70.9%, 72.2%, and 72.1% of patients reported at least 1 AE in the placebo, 70 mg, 210 mg, and 490 mg groups, respectively. The most common AE's (>5% in any treatment group) were asthma, nasopharyngitis, upper respiratory tract infection, headache, and injection site reaction (ISR). The most common drug-related AE was ISR, which was reported more frequently in the astegolimab treatment groups than in the placebo group (1 [0.8%] patient in the placebo group, 10 [7.9%] patients with 70 mg, 8 [6.3%] patients with 210 mg, 6 [4.9%] patients with 490 mg). All ISRs were non-serious and mild or moderate in severity. During the treatment period, 50 SAEs were reported in 37 (7.4%) patients. The number of patients reporting SAEs was comparable across all cohorts (11 SAEs in 8 patients on placebo, 21 SAEs in 14 patients on 70 mg, 11 SAEs in 9 patients on 210 mg, and 7 SAEs in 6 patients on 490 mg). The most common SAE was asthma. One SAE of moderate  livedo reticularis (70 mg) was considered a suspected unexpected serious adverse drug reaction (SUSAR) related to astegolimab and was reported two days after the  second dose, leading to discontinuation of astegolimab. Two (0.4%) patients reported anaphylaxis and hypersensitivity reactions: 1 severe SAE of anaphylactic reaction (placebo), and 1 moderate hypersensitivity (490 mg) considered related to astegolimab. Three (0.6%) patients experienced a potential Major Adverse Cardiac Event (MACE) (1 patient each in the placebo, 70 mg, and 210 mg groups). None of the potential MACE were considered related to astegolimab. Overall, 233 (46.4%) patients reported events of infection. A comparable number of patients reported infection across all treatment groups (65 [51.2%] patients on placebo, 55 [43.3%] patients on 70 mg, 58 [46.0%] patients on 210 mg, and 55 [45.1%] patients on 490 mg). The most frequently reported infection (>=10% incidence) was nasopharyngitis (12.7%). One patient on astegolimab 210 mg and the partner of one patient on placebo became pregnant during the study. Both delivered normal/healthy babies. Two deaths, unrelated to study drug, were reported: one patient on 210 mg astegolimab died following an SAE of asthma; the other patient on 490 mg astegolimab had an unexplained death. There were no clinically meaningful changes in laboratory parameters, vital signs, or ECG results, other than the 10% decrease in mean blood eosinophil counts in the astegolimab-treated groups, with no safety concerns. Treatment-induced ADAs were comparable between the astegolimab groups and had no impact on safety. Overall, astegolimab was well tolerated at all doses used and had a safety profile consistent with that observed in the previous astegolimab Phase I studies.  Serious Adverse Reactions Observed in Previous COPD Study: In the completed IIS Study [id number], a total of 81 patients received at least one dose of astegolimab or placebo. The safety profile of astegolimab was similar to that of placebo. There were a total of 222 AEs reported in 62 patients. A total of 28 (72%)  patients in the placebo arm and 34 (81%) patients in the astegolimab arm reported at least one AE. The most commonly reported AEs were headache followed by urinary tract infection and viral upper respiratory tract infection. A total of 39 SAEs were reported in 28 (35%) patients; 16 (41%) patients in the placebo group and 12 (29%) in the astegolimab group. The most commonly reported SAE was hospital admission for community acquired pneumonia. Four SAEs resulted in patient discontinuation from treatment (1 patient in the placebo group and 3 patients in the astegolimab group). One patient in the placebo group experienced an AESI of potential MACE (heart failure, unrelated to the study treatment). No anaphylaxis or pregnancies were reported. Two deaths, unrelated to study treatment, were reported: 1 patient in the placebo group died after hospital acquired pneumonia, and another patient in the placebo group died after pneumonia and type 2 respiratory failure.  Safety Data: Astegolimab has been generally well tolerated. There have been 49 patient deaths across all astegolimab studies (Section 5.5.2), none of which were considered related to astegolimab. A total of 144 subjects experienced a total of 224 SAEs across all astegolimab studies. Of these, an SAE livedo reticularis observed in 1 patient was considered related to astegolimab by the investigator (Section 5.5.3). AEs leading to withdrawal (Section 5.5.4) have generally occurred at a rate similar to what would be expected for clinical trials in the studies' respective indications.   [facility] Clinical Research Coordinator note :    This visit for Subject [id number] with DOB: July 18, 1956 on 21 Jun 2023 for the above protocol  is Visit/Encounter # [id number] and is for purpose of research.    Subject expressed continued interest and consent in continuing as a study subject. Subject confirmed that there was no change in contact information (e.g. address,  telephone, email). Subject thanked for participation in research and contribution to science.    The Subject was informed that the PI Dr. Marchelle Gearing continues to have oversight of the subject's visits and course  through relevant discussions, reviews and also specifically of this visit by routing of this note to the PI. All assessments and procedures were performed per above stated protocol. Subject tolerated injections well. Please refer to subject paper source binder for further details of this visit.     Signed by Laurelyn Sickle Clinical Research Coordinator  [facility]  [place], [place]

## 2023-06-22 LAB — CULTURE, URINE COMPREHENSIVE

## 2023-06-23 ENCOUNTER — Telehealth: Payer: Self-pay | Admitting: *Deleted

## 2023-06-23 MED ORDER — FLUCONAZOLE 100 MG PO TABS: 100.0000 mg | ORAL_TABLET | Freq: Every day | ORAL | 0 refills | Status: AC

## 2023-06-23 NOTE — Telephone Encounter (Signed)
-----   Message from Huckabay A Macdiarmid sent at 06/23/2023 12:37 PM EST ----- Diflucan 100 mg daily for 3 days ----- Message ----- From: Interface, [facility] Lab Results In Sent: 06/19/2023   4:36 PM EST To: Alfredo Martinez, MD

## 2023-06-23 NOTE — Telephone Encounter (Signed)
Left message on machine with detailed results, advised tocall if any questions.  rx sent to pharmacy by e-script

## 2023-06-23 NOTE — Telephone Encounter (Signed)
Patient called back to let us know that she received our message and will pick up her medication

## 2023-06-24 DIAGNOSIS — J449 Chronic obstructive pulmonary disease, unspecified: Secondary | ICD-10-CM | POA: Diagnosis not present

## 2023-06-24 DIAGNOSIS — J9611 Chronic respiratory failure with hypoxia: Secondary | ICD-10-CM | POA: Diagnosis not present

## 2023-06-26 ENCOUNTER — Ambulatory Visit (HOSPITAL_COMMUNITY)
Admission: RE | Admit: 2023-06-26 | Discharge: 2023-06-26 | Disposition: A | Discharge status: Home / Self Care | Payer: Medicare HMO | Source: Ambulatory Visit | Attending: Primary Care | Admitting: Primary Care

## 2023-06-26 DIAGNOSIS — R911 Solitary pulmonary nodule: Secondary | ICD-10-CM | POA: Diagnosis not present

## 2023-06-26 DIAGNOSIS — R918 Other nonspecific abnormal finding of lung field: Secondary | ICD-10-CM | POA: Diagnosis not present

## 2023-06-26 LAB — GLUCOSE, CAPILLARY: Glucose-Capillary: 81 mg/dL (ref 70–99)

## 2023-06-26 MED ORDER — FLUDEOXYGLUCOSE F - 18 (FDG) INJECTION
6.8000 | Freq: Once | INTRAVENOUS | Status: AC
  Administered 2023-06-26: 6.75 via INTRAVENOUS

## 2023-06-28 NOTE — Progress Notes (Unsigned)
MRN : [id number]  Maria [name] is a 67 y.o. (04-18-1956) female who presents with chief complaint of check carotid arteries.  History of Present Illness:   The patient is seen for follow up evaluation of carotid stenosis status post left carotid angioplasty on 03/07/2023.    Procedure: Angioplasty of the left internal carotid artery to 5 mm with the use of a Nav 6 embolic protection device.   Attempted placement of a 6 x 8 x 40 exact stent with the use of the NAV-6 embolic protection device in the left carotid artery   There were no post operative problems or complications related to the surgery.  The patient denies neck or incisional pain.  The patient denies interval amaurosis fugax. There is no recent history of TIA symptoms or focal motor deficits. There is no prior documented CVA.  The patient denies headache.  The patient is taking enteric-coated aspirin 81 mg daily.  No recent shortening of the patient's walking distance or new symptoms consistent with claudication.  No history of rest pain symptoms. No new ulcers or wounds of the lower extremities have occurred.  There is no history of DVT, PE or superficial thrombophlebitis. No recent episodes of angina or shortness of breath documented.   Duplex ultrasound of the carotid arteries obtained today demonstrates RICA 40-59% and LICA 60-79%   No outpatient medications have been marked as taking for the 06/29/23 encounter (Appointment) with Gilda Crease, Latina Craver, MD.   Current Facility-Administered Medications for the 06/29/23 encounter (Appointment) with Gilda Crease, Latina Craver, MD  Medication   Study - ARNASA [phone] - astegolimab 238 mg/1.7 mL or placebo SQ injection (PI-Ramaswamy)   Study - ARNASA [id number] - astegolimab 238 mg/1.7 mL or placebo SQ injection (PI-Ramaswamy)    Past Medical History:  Diagnosis Date   Atherosclerosis 1/0612014   aorta, iliacs and CFA bilaterally no greater than 0-49% - lower  arterial doppler.   Blood transfusion without reported diagnosis 2005   Cancer [facility]) Ovarian 1978   Chest pain    COPD (chronic obstructive pulmonary disease) (HCC)    COVID-19 long hauler manifesting chronic dyspnea    Emphysema of lung (HCC) 2019   Oxygen deficiency 03/2019   Oxygen dependent    3 liters up to 5    Past Surgical History:  Procedure Laterality Date   ABDOMINAL HYSTERECTOMY  1978   BREAST BIOPSY Left 04/04/2023   Stereo bx, X clip, path pending   BREAST BIOPSY Left 04/04/2023   MM LT BREAST BX W LOC DEV 1ST LESION IMAGE BX SPEC STEREO GUIDE 04/04/2023 [facility]   CAROTID PTA/STENT INTERVENTION Left 03/07/2023   Procedure: CAROTID PTA/STENT INTERVENTION;  Surgeon: Renford Dills, MD;  Location: [facility] INVASIVE CV LAB;  Service: Cardiovascular;  Laterality: Left;   CHOLECYSTECTOMY  2010   COLONOSCOPY     COLONOSCOPY     COLONOSCOPY WITH PROPOFOL N/A 11/01/2021   Procedure: COLONOSCOPY WITH PROPOFOL;  Surgeon: Wyline Mood, MD;  Location: [facility] ENDOSCOPY;  Service: Gastroenterology;  Laterality: N/A;   ESOPHAGOGASTRODUODENOSCOPY     NECK SURGERY  1999   OVARIAN CYST REMOVAL  1988   SPINE SURGERY  Neck 2002    Social History Social History   Tobacco Use   Smoking status: Former    Current packs/day: 0.00    Average packs/day: 1 pack/day for 45.0 years (45.0 ttl  pk-yrs)    Types: Cigarettes    Start date: 03/19/1971    Quit date: 03/18/2016    Years since quitting: 7.2    Passive exposure: Past   Smokeless tobacco: Never  Vaping Use   Vaping status: Never Used  Substance Use Topics   Alcohol use: No    Alcohol/week: 0.0 standard drinks of alcohol   Drug use: No    Family History Family History  Problem Relation Age of Onset   Heart failure Mother    Diabetes Mother    Kidney disease Mother    Hypertension Mother    Arthritis Mother    Heart disease Mother    Obesity Mother    COPD Father    Hearing loss Father    Lung cancer Brother     COPD Brother    COPD Brother    Asthma Son    Diabetes Son    Birth defects Son    Breast cancer Maternal Aunt 40    Allergies  Allergen Reactions   Montelukast     Bad dreams     REVIEW OF SYSTEMS (Negative unless checked)  Constitutional: [] Weight loss  [] Fever  [] Chills Cardiac: [] Chest pain   [] Chest pressure   [] Palpitations   [] Shortness of breath when laying flat   [] Shortness of breath with exertion. Vascular:  [x] Pain in legs with walking   [] Pain in legs at rest  [] History of DVT   [] Phlebitis   [] Swelling in legs   [] Varicose veins   [] Non-healing ulcers Pulmonary:   [] Uses home oxygen   [] Productive cough   [] Hemoptysis   [] Wheeze  [] COPD   [] Asthma Neurologic:  [] Dizziness   [] Seizures   [] History of stroke   [] History of TIA  [] Aphasia   [] Vissual changes   [] Weakness or numbness in arm   [] Weakness or numbness in leg Musculoskeletal:   [] Joint swelling   [x] Joint pain   [] Low back pain Hematologic:  [] Easy bruising  [] Easy bleeding   [] Hypercoagulable state   [] Anemic Gastrointestinal:  [] Diarrhea   [] Vomiting  [x] Gastroesophageal reflux/heartburn   [] Difficulty swallowing. Genitourinary:  [] Chronic kidney disease   [] Difficult urination  [] Frequent urination   [] Blood in urine Skin:  [] Rashes   [] Ulcers  Psychological:  [] History of anxiety   []  History of major depression.  Physical Examination  There were no vitals filed for this visit. There is no height or weight on file to calculate BMI. Gen: WD/WN, NAD Head: [place]/AT, No temporalis wasting.  Ear/Nose/Throat: Hearing grossly intact, nares w/o erythema or drainage Eyes: PER, EOMI, sclera nonicteric.  Neck: Supple, no masses.  No bruit or JVD.  Pulmonary:  Good air movement, no audible wheezing, no use of accessory muscles.  Cardiac: RRR, normal S1, S2, no Murmurs. Vascular:  carotid bruit noted Vessel Right Left  Radial Palpable Palpable  Carotid  Palpable  Palpable  Subclav  Palpable Palpable   Gastrointestinal: soft, non-distended. No guarding/no peritoneal signs.  Musculoskeletal: M/S 5/5 throughout.  No visible deformity.  Neurologic: CN 2-12 intact. Pain and light touch intact in extremities.  Symmetrical.  Speech is fluent. Motor exam as listed above. Psychiatric: Judgment intact, Mood & affect appropriate for pt's clinical situation. Dermatologic: No rashes or ulcers noted.  No changes consistent with cellulitis.   CBC Lab Results  Component Value Date   WBC 15.2 (H) 05/12/2023   HGB 10.6 (L) 05/12/2023   HCT 32.2 (L) 05/12/2023   MCV 96.1 05/12/2023   PLT 256 05/12/2023  BMET    Component Value Date/Time   NA 135 05/12/2023 0627   K 3.9 05/12/2023 0627   CL 101 05/12/2023 0627   CO2 24 05/12/2023 0627   GLUCOSE 95 05/12/2023 0627   BUN 17 05/12/2023 0627   CREATININE 0.63 05/12/2023 0627   CALCIUM 9.0 05/12/2023 0627   GFRNONAA >60 05/12/2023 0627   GFRAA >60 03/03/2019 0154   CrCl cannot be calculated (Patient's most recent lab result is older than the maximum 21 days allowed.).  COAG Lab Results  Component Value Date   INR 1.0 10/03/2008   INR 1.0 10/02/2008    Radiology No results found.   Assessment/Plan There are no diagnoses linked to this encounter.   Levora Dredge, MD  06/28/2023 7:37 AM

## 2023-06-29 ENCOUNTER — Telehealth: Payer: Self-pay | Admitting: Student in an Organized Health Care Education/Training Program

## 2023-06-29 ENCOUNTER — Other Ambulatory Visit: Payer: Self-pay | Admitting: *Deleted

## 2023-06-29 ENCOUNTER — Encounter (INDEPENDENT_AMBULATORY_CARE_PROVIDER_SITE_OTHER): Payer: Self-pay | Admitting: Vascular Surgery

## 2023-06-29 ENCOUNTER — Ambulatory Visit (INDEPENDENT_AMBULATORY_CARE_PROVIDER_SITE_OTHER): Payer: Medicare HMO | Admitting: Vascular Surgery

## 2023-06-29 VITALS — BP 135/62 | HR 65 | Resp 16 | Wt 141.0 lb

## 2023-06-29 DIAGNOSIS — G8929 Other chronic pain: Secondary | ICD-10-CM

## 2023-06-29 DIAGNOSIS — I70213 Atherosclerosis of native arteries of extremities with intermittent claudication, bilateral legs: Secondary | ICD-10-CM

## 2023-06-29 DIAGNOSIS — M5010 Cervical disc disorder with radiculopathy, unspecified cervical region: Secondary | ICD-10-CM

## 2023-06-29 DIAGNOSIS — G894 Chronic pain syndrome: Secondary | ICD-10-CM

## 2023-06-29 DIAGNOSIS — K219 Gastro-esophageal reflux disease without esophagitis: Secondary | ICD-10-CM | POA: Diagnosis not present

## 2023-06-29 DIAGNOSIS — I6522 Occlusion and stenosis of left carotid artery: Secondary | ICD-10-CM

## 2023-06-29 DIAGNOSIS — E782 Mixed hyperlipidemia: Secondary | ICD-10-CM | POA: Diagnosis not present

## 2023-06-29 DIAGNOSIS — J441 Chronic obstructive pulmonary disease with (acute) exacerbation: Secondary | ICD-10-CM

## 2023-06-29 DIAGNOSIS — Q761 Klippel-Feil syndrome: Secondary | ICD-10-CM

## 2023-06-29 MED ORDER — GABAPENTIN 600 MG PO TABS
600.0000 mg | ORAL_TABLET | Freq: Three times a day (TID) | ORAL | 5 refills | Status: DC
Start: 2023-06-29 — End: 2024-02-12

## 2023-06-29 NOTE — Telephone Encounter (Signed)
PT called stated that pharmacy is waiting on refill prescription for her gabapentin to be send in. PT stated that she is out. Please give patient a call. TY

## 2023-06-29 NOTE — Telephone Encounter (Signed)
Rx request sent to Dr. Laban Emperor. Dr. Cherylann Ratel is out of the office.

## 2023-06-30 ENCOUNTER — Ambulatory Visit: Payer: Medicare HMO | Admitting: Acute Care

## 2023-06-30 ENCOUNTER — Encounter: Payer: Self-pay | Admitting: Acute Care

## 2023-06-30 ENCOUNTER — Other Ambulatory Visit: Payer: Self-pay | Admitting: *Deleted

## 2023-06-30 ENCOUNTER — Other Ambulatory Visit: Payer: Self-pay | Admitting: Primary Care

## 2023-06-30 VITALS — BP 127/66 | HR 59 | Temp 97.7°F | Ht 65.0 in | Wt 146.0 lb

## 2023-06-30 DIAGNOSIS — Z87891 Personal history of nicotine dependence: Secondary | ICD-10-CM

## 2023-06-30 DIAGNOSIS — R911 Solitary pulmonary nodule: Secondary | ICD-10-CM

## 2023-06-30 DIAGNOSIS — Z122 Encounter for screening for malignant neoplasm of respiratory organs: Secondary | ICD-10-CM

## 2023-06-30 DIAGNOSIS — J449 Chronic obstructive pulmonary disease, unspecified: Secondary | ICD-10-CM | POA: Diagnosis not present

## 2023-06-30 DIAGNOSIS — J9611 Chronic respiratory failure with hypoxia: Secondary | ICD-10-CM | POA: Diagnosis not present

## 2023-06-30 NOTE — Patient Instructions (Addendum)
It is good to see you today. Your PET scan shows the nodule we were concerned about has decreased in size, and is most likley resolving infection or inflammation.  This is good news.  Plan will be to return to Lung Cancer screening October 2025. You will get a call to get this scheduled.  - Continue  Ipratropium-albuterol every 4-6 hours  (for shortness of breath)  - Make sure you are taking Daliresp once daily - Continue Breztri Aerosphere two puffs twice daily  - Continue oxygen at 3-4 L . -Make sure your oxygen saturations are > 88%. - Continue study drug per study protocol - Follow up appointments with [facility] 12/18 and 12/31 as scheduled.  -Note your daily symptoms > remember "red flags" for COPD:  Increase in cough, increase in sputum production, increase in shortness of breath or activity intolerance. If you notice these symptoms, please call to be seen.    Please contact office for sooner follow up if symptoms do not improve or worsen or seek emergency care   Have a good Holiday.

## 2023-06-30 NOTE — Progress Notes (Signed)
History of Present Illness Maria [name] is a 67 y.o. female former smoker followed through the lung cancer  screening program, and by Dr. Marchelle Gearing. Medical history significant for COPD, chronic respiratory failure, bronchiectasis, positive ANA, emphysema, pulmonary hypertension, aortic arthrosclerosis, cervical fusion, lipidemia, history of DVT. She is currently participating in a research protocol  evaluating  the efficacy and safety of astegolimab in patients with chronic obstructive pulmonary disease .    06/30/2023 Pt. Presents for follow up to review PET scan which was done as a follow up for a LR 4 B scan. Pt. States she has been experiencing some worsening dyspnea than is her baseline.   We have reviewed the results of her PET scan. The nodule of concern has decreased in size since the LDCT, and demonstrates no hypermetabolic activity, most consistent with resolving focal inflammation/infection.This is reassuring.We will return patient to the screening program , next scan due 04/2024.  Pt denies any fever, discolored secretions. She is compliant with her Ardine Eng . She is using her rescue about 2 times daily.She is wearing her oxygen at between 3-4 L, and she maintains her sats > 88%.  She is compliant with her research medication , and research follow up visits. She has pending appointments 12/18, and 12/31.      Test Results: PET Scan 06/26/2023 The previously demonstrated irregular right lower lobe pulmonary nodule has decreased in size and density since the prior CT of 6 weeks ago and demonstrates no hypermetabolic activity, most consistent with resolving focal inflammation/infection. Continue annual screening with low-dose chest CT without contrast in 12 months. 2. No suspicious findings. 3. Aortic Atherosclerosis (ICD10-I70.0) and Emphysema (ICD10-J43.9).  04/21/2023 Lung-RADS 4B, suspicious. 20.2 mm irregular nodular opacity in the posterior right base  associated with other peripheral irregular nodularity and posterior right lower lobe airway impaction. Features may well be infectious/inflammatory but warrants close follow-up. Additional imaging evaluation or consultation with Pulmonology or Thoracic Surgery recommended. 2.  Emphysema (ICD10-J43.9) and Aortic Atherosclerosis (ICD10-170.0)      Latest Ref Rng & Units 05/12/2023    6:27 AM 05/11/2023    5:29 AM 05/10/2023    4:40 PM  CBC  WBC 4.0 - 10.5 K/uL 15.2  13.1  13.1   Hemoglobin 12.0 - 15.0 g/dL 78.2  95.6  21.3   Hematocrit 36.0 - 46.0 % 32.2  32.4  36.4   Platelets 150 - 400 K/uL 256  252  300        Latest Ref Rng & Units 05/12/2023    6:27 AM 05/11/2023    5:29 AM 05/10/2023    4:40 PM  BMP  Glucose 70 - 99 mg/dL 95  086  578   BUN 8 - 23 mg/dL 17  15  18    Creatinine 0.44 - 1.00 mg/dL 4.69  6.29  5.28   Sodium 135 - 145 mmol/L 135  138  140   Potassium 3.5 - 5.1 mmol/L 3.9  4.5  3.5   Chloride 98 - 111 mmol/L 101  102  106   CO2 22 - 32 mmol/L 24  26  26    Calcium 8.9 - 10.3 mg/dL 9.0  9.0  8.9     BNP    Component Value Date/Time   BNP 173.0 (H) 05/12/2023 0627    ProBNP    Component Value Date/Time   PROBNP 36.0 03/31/2022 1019    PFT    Component Value Date/Time   FEV1PRE 1.60 08/29/2022 0829  FEV1POST 1.71 11/12/2020 1043   FVCPRE 2.79 08/29/2022 0829   FVCPOST 3.18 11/12/2020 1043   TLC 4.89 11/12/2020 1043   DLCOUNC 9.85 08/29/2022 0829   PREFEV1FVCRT 57 08/29/2022 0829   PSTFEV1FVCRT 54 11/12/2020 1043    NM PET Image Initial (PI) Skull Base To Thigh Result Date: 06/29/2023 CLINICAL DATA:  Initial treatment strategy for right lower lobe pulmonary nodule on chest CT. EXAM: NUCLEAR MEDICINE PET SKULL BASE TO THIGH TECHNIQUE: 6.75 mCi F-18 FDG was injected intravenously. Full-ring PET imaging was performed from the skull base to thigh after the radiotracer. CT data was obtained and used for attenuation correction and anatomic  localization. Fasting blood glucose: 81 mg/dl COMPARISON:  Chest CT 82/95/6213, 04/21/2023 and 03/25/2022. FINDINGS: Mediastinal blood pool activity: SUV max 1.7 NECK: No hypermetabolic cervical lymph nodes are identified. No suspicious activity identified within the pharyngeal mucosal space. Incidental CT findings: Bilateral carotid atherosclerosis. CHEST: There are no hypermetabolic mediastinal, hilar or axillary lymph nodes. The previously demonstrated irregular right lower lobe pulmonary nodule has decreased in size and density since the prior CT of 6 weeks ago, most consistent with resolving focal inflammation/infection. This currently measures approximately 1.1 x 0.6 cm on image 55/7 (previously 1.6 x 1.5 cm) and demonstrates no hypermetabolic activity (SUV max 2.2). No hypermetabolic activity or suspicious nodularity elsewhere in the lungs. Incidental CT findings: Diffuse atherosclerosis of the aorta, great vessels and coronary arteries. Moderate to severe centrilobular and paraseptal emphysema. Scattered central airway thickening with mild dependent atelectasis in both lungs. ABDOMEN/PELVIS: There is no hypermetabolic activity within the liver, adrenal glands, spleen or pancreas. There is no hypermetabolic nodal activity in the abdomen or pelvis. Incidental CT findings: Prior cholecystectomy. Aortic and branch vessel atherosclerosis. Mildly prominent stool throughout the colon. SKELETON: There is no hypermetabolic activity to suggest osseous metastatic disease. Incidental CT findings: none IMPRESSION: 1. The previously demonstrated irregular right lower lobe pulmonary nodule has decreased in size and density since the prior CT of 6 weeks ago and demonstrates no hypermetabolic activity, most consistent with resolving focal inflammation/infection. Continue annual screening with low-dose chest CT without contrast in 12 months. 2. No suspicious findings. 3. Aortic Atherosclerosis (ICD10-I70.0) and Emphysema  (ICD10-J43.9). Electronically Signed   By: Carey Bullocks M.D.   On: 06/29/2023 14:00     Past medical hx Past Medical History:  Diagnosis Date   Atherosclerosis 1/0612014   aorta, iliacs and CFA bilaterally no greater than 0-49% - lower arterial doppler.   Blood transfusion without reported diagnosis 2005   Cancer [facility]) Ovarian 1978   Chest pain    COPD (chronic obstructive pulmonary disease) (HCC)    COVID-19 long hauler manifesting chronic dyspnea    Emphysema of lung (HCC) 2019   Oxygen deficiency 03/2019   Oxygen dependent    3 liters up to 5     Social History   Tobacco Use   Smoking status: Former    Current packs/day: 0.00    Average packs/day: 1 pack/day for 45.0 years (45.0 ttl pk-yrs)    Types: Cigarettes    Start date: 03/19/1971    Quit date: 03/18/2016    Years since quitting: 7.2    Passive exposure: Past   Smokeless tobacco: Never  Vaping Use   Vaping status: Never Used  Substance Use Topics   Alcohol use: No    Alcohol/week: 0.0 standard drinks of alcohol   Drug use: No    Ms.Duncan reports that she quit smoking about 7 years ago. Her  smoking use included cigarettes. She started smoking about 52 years ago. She has a 45 pack-year smoking history. She has been exposed to tobacco smoke. She has never used smokeless tobacco. She reports that she does not drink alcohol and does not use drugs.  Tobacco Cessation: Former smoker , quit 2017 with a 45 pack year smoking history   Past surgical hx, Family hx, Social hx all reviewed.  Current Outpatient Medications on File Prior to Visit  Medication Sig   albuterol (VENTOLIN HFA) 108 (90 Base) MCG/ACT inhaler TAKE 2 PUFFS BY MOUTH EVERY 6 HOURS AS NEEDED FOR WHEEZE OR SHORTNESS OF BREATH   ASPIRIN LOW DOSE 81 MG EC tablet TAKE 1 TABLET BY MOUTH EVERY DAY   atorvastatin (LIPITOR) 10 MG tablet Take 1 tablet (10 mg total) by mouth daily.   Azelastine HCl 137 MCG/SPRAY SOLN USE 1 SPRAY INTO EACH NOSTRIL TWICE A DAY    benzonatate (TESSALON) 200 MG capsule Take 1 capsule by mouth three times daily as needed   Budeson-Glycopyrrol-Formoterol (BREZTRI AEROSPHERE) 160-9-4.8 MCG/ACT AERO Inhale 2 puffs into the lungs in the morning and at bedtime.   Cholecalciferol (VITAMIN D-3) 125 MCG (5000 UT) TABS Take 5,000 Units by mouth every evening.   clopidogrel (PLAVIX) 75 MG tablet Take 1 tablet (75 mg total) by mouth daily.   escitalopram (LEXAPRO) 20 MG tablet Take 1 tablet (20 mg total) by mouth daily.   gabapentin (NEURONTIN) 600 MG tablet Take 1 tablet (600 mg total) by mouth every 8 (eight) hours.   hydrOXYzine (VISTARIL) 25 MG capsule TAKE 1 CAPSULE BY MOUTH AT BEDTIME AS NEEDED   ipratropium-albuterol (DUONEB) 0.5-2.5 (3) MG/3ML SOLN USE 1 AMPULE IN NEBULIZER 4 TIMES A DAY AS NEEDED    (Dx: J84.112, J44.9)   methocarbamol (ROBAXIN) 500 MG tablet Take 1 tablet (500 mg total) by mouth at bedtime as needed for muscle spasms.   midodrine (PROAMATINE) 5 MG tablet Take 1 tablet (5 mg total) by mouth 2 (two) times daily with a meal for 14 days, THEN 0.5 tablets (2.5 mg total) 2 (two) times daily with a meal for 14 days.   oxybutynin (DITROPAN-XL) 10 MG 24 hr tablet Take 1 tablet (10 mg total) by mouth daily.   OXYGEN Inhale into the lungs. 3 liters at rest and 5 liters with any activity.-   pantoprazole (PROTONIX) 40 MG tablet Take 1 tablet by mouth once daily   Potassium 99 MG TABS Take 99 mg by mouth daily.   Respiratory Therapy Supplies (FLUTTER) DEVI Use as directed   roflumilast (DALIRESP) 500 MCG TABS tablet Take 1 tablet (500 mcg total) by mouth daily.   sodium chloride HYPERTONIC 3 % nebulizer solution Take by nebulization 2 (two) times daily.   Current Facility-Administered Medications on File Prior to Visit  Medication   Study - ARNASA [id number] - astegolimab 238 mg/1.7 mL or placebo SQ injection (PI-Ramaswamy)   Study - ARNASA [id number] - astegolimab 238 mg/1.7 mL or placebo SQ injection (PI-Ramaswamy)      Allergies  Allergen Reactions   Montelukast     Bad dreams    Review Of Systems:  Constitutional:   No  weight loss, night sweats,  Fevers, chills, fatigue, or  lassitude.  HEENT:   No headaches,  Difficulty swallowing,  Tooth/dental problems, or  Sore throat,                No sneezing, itching, ear ache, nasal congestion, post nasal drip,   CV:  No chest pain,  Orthopnea, PND, swelling in lower extremities, anasarca, dizziness, palpitations, syncope.   GI  No heartburn, indigestion, abdominal pain, nausea, vomiting, diarrhea, change in bowel habits, loss of appetite, bloody stools.   Resp: + shortness of breath with exertion less at rest.  + baseline  mucus, + baseline  productive cough,  No non-productive cough,  No coughing up of blood.  No change in color of mucus.  No wheezing.  No chest wall deformity  Skin: no rash or lesions.  GU: no dysuria, change in color of urine, no urgency or frequency.  No flank pain, no hematuria   MS:  No joint pain or swelling.  No decreased range of motion.  No back pain.  Psych:  No change in mood or affect. No depression or anxiety.  No memory loss.   Vital Signs BP 127/66   Pulse (!) 59   Temp 97.7 F (36.5 C) (Oral)   Ht 5\' 5"  (1.651 m)   Wt 146 lb (66.2 kg)   BMI 24.30 kg/m    Physical Exam:  General- No distress,  A&Ox3, pleasant ENT: No sinus tenderness, TM clear, pale nasal mucosa, no oral exudate,no post nasal drip, no LAN Cardiac: S1, S2, regular rate and rhythm, no murmur Chest: No wheeze/ rales/ dullness; no accessory muscle use, no nasal flaring, no sternal retractions, distant breath sounds, decreased in bases bilaterally Abd.: Soft Non-tender, ND, BS +, Body mass index is 24.3 kg/m.  Ext: No clubbing cyanosis, edema Neuro:  normal strength, MAE x 4, A&O x 3 Skin: No rashes, warm and dry, No lesions  Psych: normal mood and behavior   Assessment/Plan Former smoker  Abnormal LDCT 4B PET scan without  Hypermetabolic activity Most likely resolving infection or inflammation COPD Chronic hypoxic respiratory failure Research participant evaluating  the efficacy and safety of astegolimab in patients with chronic obstructive pulmonary disease through [facility].  Plan Your PET scan shows the nodule we were concerned about has decreased in size, and is most likley resolving infection or inflammation.  This is good news.  Plan will be to return to Lung Cancer screening October 2025. You will get a call to get this scheduled.  - Continue  Ipratropium-albuterol every 4-6 hours  (for shortness of breath)  - Make sure you are taking Daliresp once daily - Continue Breztri Aerosphere two puffs twice daily  - Continue oxygen at 3-4 L . -Make sure your oxygen saturations are > 88%. - Continue study drug per study protocol - Follow up appointments with [facility] 12/18 and 12/31 as scheduled.  -Note your daily symptoms > remember "red flags" for COPD:  Increase in cough, increase in sputum production, increase in shortness of breath or activity intolerance. If you notice these symptoms, please call to be seen.    Please contact office for sooner follow up if symptoms do not improve or worsen or seek emergency care     I spent 30 minutes dedicated to the care of this patient on the date of this encounter to include pre-visit review of records, face-to-face time with the patient discussing conditions above, post visit ordering of testing, clinical documentation with the electronic health record, making appropriate referrals as documented, and communicating necessary information to the patient's healthcare team.    Bevelyn Ngo, NP 06/30/2023  9:19 AM

## 2023-06-30 NOTE — Progress Notes (Signed)
Please let patient know CT showed decrease size of nodule, no activity and not concerning for malignancy at this time. We will follow up with repeat imaging in 1 year

## 2023-07-01 DIAGNOSIS — G894 Chronic pain syndrome: Secondary | ICD-10-CM | POA: Diagnosis not present

## 2023-07-01 DIAGNOSIS — S92352S Displaced fracture of fifth metatarsal bone, left foot, sequela: Secondary | ICD-10-CM | POA: Diagnosis not present

## 2023-07-01 DIAGNOSIS — M5010 Cervical disc disorder with radiculopathy, unspecified cervical region: Secondary | ICD-10-CM | POA: Diagnosis not present

## 2023-07-01 DIAGNOSIS — M25511 Pain in right shoulder: Secondary | ICD-10-CM | POA: Diagnosis not present

## 2023-07-01 DIAGNOSIS — Q761 Klippel-Feil syndrome: Secondary | ICD-10-CM | POA: Diagnosis not present

## 2023-07-04 ENCOUNTER — Ambulatory Visit: Payer: Medicare HMO | Admitting: Nurse Practitioner

## 2023-07-04 ENCOUNTER — Encounter: Payer: Self-pay | Admitting: Nurse Practitioner

## 2023-07-04 VITALS — BP 138/60 | HR 60 | Temp 98.0°F | Ht 65.0 in | Wt 147.4 lb

## 2023-07-04 DIAGNOSIS — J9611 Chronic respiratory failure with hypoxia: Secondary | ICD-10-CM

## 2023-07-04 DIAGNOSIS — I6529 Occlusion and stenosis of unspecified carotid artery: Secondary | ICD-10-CM | POA: Diagnosis not present

## 2023-07-04 DIAGNOSIS — I959 Hypotension, unspecified: Secondary | ICD-10-CM | POA: Diagnosis not present

## 2023-07-04 NOTE — Assessment & Plan Note (Signed)
Patient has been weaned off of midodrine.  Patient's blood pressures have been within normal limits.  No increased lightheadedness or dizziness.  Blood pressure within normal limits today.  We will discontinue midodrine altogether

## 2023-07-04 NOTE — Progress Notes (Signed)
   Established Patient Office Visit  Subjective   Patient ID: Maria [name], female    DOB: September 27, 1955  Age: 67 y.o. MRN: [id number]  Chief Complaint  Patient presents with   Follow-up    BP recheck. Pt states bp readings usually are 121/58. States readings are never over 138.     HPI   Hypotension: states that she has stopped the midroine and doing well.  Patient has been checking her blood pressure at home.  States she has had no hypotension per her report.  States sometimes she has her off balance issues but has been baseline prior to blood pressure issues.  Of note she states she has had increase her oxygen use at bedtime not sure why patient has follow-up pulmonology has appointment tomorrow   Review of Systems  Constitutional:  Negative for chills and fever.  Respiratory:  Positive for shortness of breath.   Cardiovascular:  Negative for chest pain.  Neurological:  Negative for dizziness and headaches.      Objective:     BP 138/60   Pulse 60   Temp 98 F (36.7 C) (Oral)   Ht 5\' 5"  (1.651 m)   Wt 147 lb 6.4 oz (66.9 kg)   SpO2 100%   BMI 24.53 kg/m  BP Readings from Last 3 Encounters:  07/04/23 138/60  06/30/23 127/66  06/29/23 135/62   Wt Readings from Last 3 Encounters:  07/04/23 147 lb 6.4 oz (66.9 kg)  06/30/23 146 lb (66.2 kg)  06/29/23 141 lb (64 kg)   SpO2 Readings from Last 3 Encounters:  07/04/23 100%  06/06/23 97%  05/15/23 100%      Physical Exam Vitals and nursing note reviewed.  Constitutional:      Appearance: Normal appearance.  Cardiovascular:     Rate and Rhythm: Normal rate and regular rhythm.     Heart sounds: Normal heart sounds.  Pulmonary:     Effort: Pulmonary effort is normal.     Comments: Globally decreased.  Neurological:     Mental Status: She is alert.      No results found for any visits on 07/04/23.    The 10-year ASCVD risk score (Arnett DK, et al., 2019) is: 6%    Assessment & Plan:   Problem List  Items Addressed This Visit       Cardiovascular and Mediastinum   Carotid stenosis - Primary   Transient hypotension   Patient has been weaned off of midodrine.  Patient's blood pressures have been within normal limits.  No increased lightheadedness or dizziness.  Blood pressure within normal limits today.  We will discontinue midodrine altogether        Respiratory   Chronic respiratory failure with hypoxia (HCC)    Return in about 3 months (around 10/02/2023) for BP recheck.    Audria Nine, NP

## 2023-07-04 NOTE — Patient Instructions (Signed)
Nice to see you today Blood pressure looks good Follow up with me in 3 months, sooner if you need me

## 2023-07-05 ENCOUNTER — Encounter: Payer: Medicare HMO | Admitting: *Deleted

## 2023-07-05 DIAGNOSIS — Z006 Encounter for examination for normal comparison and control in clinical research program: Secondary | ICD-10-CM

## 2023-07-05 DIAGNOSIS — J441 Chronic obstructive pulmonary disease with (acute) exacerbation: Secondary | ICD-10-CM

## 2023-07-05 MED ORDER — STUDY - ARNASA GB44332 - ASTEGOLIMAB 238 MG/1.7 ML OR PLACEBO SQ INJECTION (PI-RAMASWAMY)
476.0000 mg | INJECTION | SUBCUTANEOUS | Status: DC
  Administered 2023-07-05: 476 mg via SUBCUTANEOUS
  Filled 2023-07-05: qty 3.4

## 2023-07-05 NOTE — Research (Cosign Needed)
Title: A Phase III, randomized, double-blind, placebo controlled, multicenter study to evaluate the efficacy and safety of astegolimab in patients with chronic obstructive pulmonary disease    Dose and Duration of Treatment: Astegolimab is presented as a sterile, slightly brown-yellow solution. Each single-use, 2.25 mL pre-filled syringe contains 1.7 mL deliverable volume. Astegolimab drug product is formulated at 140 mg/mL astegolimab with 114 mM succinic acid, 200 mM L-arginine, 10 mM L-methionine, 0.06% (w/v) polysorbate 20, pH 5.7. Placebo for astegolimab is supplied in an identical pre-filled syringe configuration.   Protocol # [id number]   Sponsor: [employer] [phone] [place], [place]  Protocol: Version 5 dated 08Nov2024 IB: version 9.0 dated April 2024 ICF: Main version 12Apr2023, revised 17July2023 Mobile Nursing: 01Sep2023, revised 01Sep2023 Lab Manual: V4.0.0 20Oct2023    Mechanism of action: Astegolimab (also known as [id number] or [id number]) is a fully human, IgG2 monoclonal antibody that binds with high affinity to the interleukin (IL)-33 (IL-33) receptor, ST2, thereby blocking the signaling of IL-33, an inflammatory cytokine of the IL-1 family and member of the "alarmin" class of molecules. Astegolimab binds with high affinity to the human and cynomolgus monkey receptor for IL-33, ST2, and blocks IL-33 binding, thus inhibiting association with the IL-1R accessory protein (AcP) co-receptor and formation of an activated receptor complex.    Integrated Pharmacokinetic/Pharmacodynamic Analysis:  In Studies [id number], [id number], and [id number], exploratory biomarker analysis showed there were consistent decreases in blood eosinophil counts throughout the treatment period, potentially mediated by a direct effect of IL-33 on eosinophil progenitors. In [place], there was no significant difference in fractional exhaled nitric oxide (FeNO) levels (reflecting airway  IL-4/IL-13 activity) between astegolimab-treated groups relative to placebo throughout the treatment period. These data suggest that astegolimab has only a limited effect on Type 2 inflammation in asthma. No data are applicable for Study [id number]. No data are available yet for Study [id number].  Special Warnings/Considerations: Administration of astegolimab, a protein therapeutic, may lead to the development of anti-astegolimab antibodies which could lead to AEs and/or decreased exposure. In non-clinical studies (see Section 4.2), ADA incidence has generally been low and there was no apparent ADA impact on PK and safety in these studies. To date, the immunogenicity rates observed with astegolimab in clinical studies have been relatively low as well (see Immunogenicity Section 5.6). Several clinical studies have been conducted in patients with asthma, atopic dermatitis, COPD (IIS Study GM01027) and COVID-19 severe pneumonia, which generally showed low incidence of ADAs (Table 31). There has been no correlation between ADA status and clinical findings or increased incidence of AEs. Astegolimab is now being considered in a larger study for the treatment of COPD. This patient population is typically considered to have a hyper-responsive immune system. Route of administration for this molecule will be [place], either Q2W or Q4W. These factors increase the risk of development of an immune response to astegolimab, specifically with repeat dosing. From the previous IIS Study [id number], incidence of ADAs was low in COPD patients; this remains to be confirmed in a larger study. To monitor ADA development in ongoing studies, serum samples will be collected from patients at protocol-defined intervals. Patients who test positive for antibodies and have clinical sequelae that are considered potentially related to an ADA response may also be asked to return for additional follow-up testing.  Drug Interaction Studies: No PK drug  interaction studies have been conducted to date.  Serious Adverse Reactions Observed in Asthma Study: During the treatment period, a similar proportion of patients across  all cohorts experienced at least one AE, regardless of causality (Table 22). In total, 77.2%, 70.9%, 72.2%, and 72.1% of patients reported at least 1 AE in the placebo, 70 mg, 210 mg, and 490 mg groups, respectively. The most common AE's (>5% in any treatment group) were asthma, nasopharyngitis, upper respiratory tract infection, headache, and injection site reaction (ISR). The most common drug-related AE was ISR, which was reported more frequently in the astegolimab treatment groups than in the placebo group (1 [0.8%] patient in the placebo group, 10 [7.9%] patients with 70 mg, 8 [6.3%] patients with 210 mg, 6 [4.9%] patients with 490 mg). All ISRs were non-serious and mild or moderate in severity. During the treatment period, 50 SAEs were reported in 37 (7.4%) patients. The number of patients reporting SAEs was comparable across all cohorts (11 SAEs in 8 patients on placebo, 21 SAEs in 14 patients on 70 mg, 11 SAEs in 9 patients on 210 mg, and 7 SAEs in 6 patients on 490 mg). The most common SAE was asthma. One SAE of moderate livedo reticularis (70 mg) was considered a suspected unexpected serious adverse drug reaction (SUSAR) related to astegolimab and was reported two days after the second dose, leading to discontinuation of astegolimab. Two (0.4%) patients reported anaphylaxis and hypersensitivity reactions: 1 severe SAE of anaphylactic reaction (placebo), and 1 moderate hypersensitivity (490 mg) considered related to astegolimab. Three (0.6%) patients experienced a potential Major Adverse Cardiac Event (MACE) (1 patient each in the placebo, 70 mg, and 210 mg groups). None of the potential MACE were considered related to astegolimab. Overall, 233 (46.4%) patients reported events of infection. A comparable number of patients reported  infection across all treatment groups (65 [51.2%] patients on placebo, 55 [43.3%] patients on 70 mg, 58 [46.0%] patients on 210 mg, and 55 [45.1%] patients on 490 mg). The most frequently reported infection (>=10% incidence) was nasopharyngitis (12.7%). One patient on astegolimab 210 mg and the partner of one patient on placebo became pregnant during the study. Both delivered normal/healthy babies. Two deaths, unrelated to study drug, were reported: one patient on 210 mg astegolimab died following an SAE of asthma; the other patient on 490 mg astegolimab had an unexplained death. There were no clinically meaningful changes in laboratory parameters, vital signs, or ECG results, other than the 10% decrease in mean blood eosinophil counts in the astegolimab-treated groups, with no safety concerns. Treatment-induced ADAs were comparable between the astegolimab groups and had no impact on safety. Overall, astegolimab was well tolerated at all doses used and had a safety profile consistent with that observed in the previous astegolimab Phase I studies.  Serious Adverse Reactions Observed in Previous COPD Study: In the completed IIS Study [id number], a total of 81 patients received at least one dose of astegolimab or placebo. The safety profile of astegolimab was similar to that of placebo. There were a total of 222 AEs reported in 62 patients. A total of 28 (72%) patients in the placebo arm and 34 (81%) patients in the astegolimab arm reported at least one AE. The most commonly reported AEs were headache followed by urinary tract infection and viral upper respiratory tract infection. A total of 39 SAEs were reported in 28 (35%) patients; 16 (41%) patients in the placebo group and 12 (29%) in the astegolimab group. The most commonly reported SAE was hospital admission for community acquired pneumonia. Four SAEs resulted in patient discontinuation from treatment (1 patient in the placebo group and 3 patients in the  astegolimab  group). One patient in the placebo group experienced an AESI of potential MACE (heart failure, unrelated to the study treatment). No anaphylaxis or pregnancies were reported. Two deaths, unrelated to study treatment, were reported: 1 patient in the placebo group died after hospital acquired pneumonia, and another patient in the placebo group died after pneumonia and type 2 respiratory failure.  Safety Data: Astegolimab has been generally well tolerated. There have been 49 patient deaths across all astegolimab studies (Section 5.5.2), none of which were considered related to astegolimab. A total of 144 subjects experienced a total of 224 SAEs across all astegolimab studies. Of these, an SAE livedo reticularis observed in 1 patient was considered related to astegolimab by the investigator (Section 5.5.3). AEs leading to withdrawal (Section 5.5.4) have generally occurred at a rate similar to what would be expected for clinical trials in the studies' respective indications.   [facility] [place] Clinical Research Coordinator note :    This visit for Maria [name] with DOB: [birth year] on 05 Jul 2023 for the above protocol is Visit/Encounter #  week 40 and is for purpose of research.    Maria expressed continued interest and consent in continuing as a study Maria. Maria confirmed that there was no change in contact information (e.g. address, telephone, email). Maria thanked for participation in research and contribution to science  The Maria was informed that the PI Dr. Marchelle Gearing continues to have oversight of the Maria's visits and course  through relevant discussions, reviews and also specifically of this visit by routing of this note to the PI. [facility] [place] Clinical Research Coordinator note:   This visit for Maria Maria [name] with DOB: 10-28-1955 on 07/05/2023 for the above protocol is Visit/Encounter # week 40  and is for purpose of research.   Maria expressed  continued interest and consent in continuing as a study Maria. Maria confirmed that there was  no  change in contact information (e.g. address, telephone, email). Maria thanked for participation in research and contribution to science.  The Maria was yes, informed that the PI Murali Ramaswamy,MD continues to have oversight of the Maria's visits and course through relevant discussions, reviews, and also specifically of this visit by routing of this note to the PI. All assessments and procedures were performed per above stated protocol. Maria tolerated injections well. Please refer to subjects paper source binder for further details of this visit.    Signed by  Laurelyn Sickle  Clinical Research Coordinator / Nurse Judie Bonus, [place] 10:17 AM 07/05/2023

## 2023-07-17 ENCOUNTER — Encounter: Payer: Medicare HMO | Admitting: *Deleted

## 2023-07-17 DIAGNOSIS — Z006 Encounter for examination for normal comparison and control in clinical research program: Secondary | ICD-10-CM

## 2023-07-17 DIAGNOSIS — J441 Chronic obstructive pulmonary disease with (acute) exacerbation: Secondary | ICD-10-CM

## 2023-07-17 MED ORDER — STUDY - ARNASA GB44332 - ASTEGOLIMAB 238 MG/1.7 ML OR PLACEBO SQ INJECTION (PI-RAMASWAMY)
476.0000 mg | INJECTION | SUBCUTANEOUS | Status: DC
  Administered 2023-07-17: 476 mg via SUBCUTANEOUS
  Filled 2023-07-17: qty 3.4

## 2023-07-18 DIAGNOSIS — J9611 Chronic respiratory failure with hypoxia: Secondary | ICD-10-CM

## 2023-07-20 ENCOUNTER — Ambulatory Visit: Payer: Medicare Other | Admitting: Physician Assistant

## 2023-07-20 DIAGNOSIS — N3946 Mixed incontinence: Secondary | ICD-10-CM | POA: Diagnosis not present

## 2023-07-20 NOTE — Telephone Encounter (Signed)
 Yes same o2 amounths

## 2023-07-20 NOTE — Progress Notes (Signed)
 PTNS  Session # 14  Health & Social Factors: no change Caffeine: 4 Alcohol: 0 Daytime voids #per day: 3 Night-time voids #per night: 1 Urgency: strong Incontinence Episodes #per day: 3 Ankle used: right Treatment Setting: 4 Feeling/ Response: sensory Comments: Last maintenance session 02/08/2023, deferred follow up due to hospitalizations. Symptoms have worsened a bit, if resuming monthly maintenance does not offer improvement may consider restarting in the future.  Performed By: Shyvonne Chastang, PA-C   Follow Up: Return in about 4 weeks (around 08/17/2023) for PTNS maintenance.

## 2023-07-20 NOTE — Telephone Encounter (Signed)
 Order placed

## 2023-07-20 NOTE — Telephone Encounter (Signed)
 No order placed since 10/2021. Order needs to be placed to change DME to [facility] due to insurance and what is being ordered

## 2023-07-20 NOTE — Telephone Encounter (Addendum)
 Patients insurance is changing to [facility] and willl need her DME company changed to [place].  Can we send a new order to [facility]?  Do we need to keep the same oxygen amounts?  3L at rest and 5L with exertion (pt also has a POC)   Dr. Marchelle Gearing, Please advise.

## 2023-07-20 NOTE — Telephone Encounter (Signed)
 Same coverage continues for the patient's plan

## 2023-07-25 ENCOUNTER — Ambulatory Visit (INDEPENDENT_AMBULATORY_CARE_PROVIDER_SITE_OTHER): Payer: Medicare Other | Admitting: General Practice

## 2023-07-25 ENCOUNTER — Encounter: Payer: Self-pay | Admitting: General Practice

## 2023-07-25 VITALS — BP 108/62 | HR 64 | Temp 97.9°F | Resp 18 | Ht 65.0 in | Wt 146.4 lb

## 2023-07-25 DIAGNOSIS — J069 Acute upper respiratory infection, unspecified: Secondary | ICD-10-CM

## 2023-07-25 DIAGNOSIS — R52 Pain, unspecified: Secondary | ICD-10-CM

## 2023-07-25 LAB — POC COVID19 BINAXNOW: SARS Coronavirus 2 Ag: NEGATIVE

## 2023-07-25 LAB — POC INFLUENZA A&B (BINAX/QUICKVUE)
Influenza A, POC: NEGATIVE
Influenza B, POC: NEGATIVE

## 2023-07-25 NOTE — Progress Notes (Signed)
 Established Patient Office Visit  Subjective   Patient ID: Maria [name], female    DOB: [birth year]  Age: 68 y.o. MRN: [id number]  Chief Complaint  Patient presents with   Generalized Body Aches    X 1:30 am   Chills    HPI  Maria [name] is a 68 year old female, patient of Lynwood Crandall, NP, with past medical history of pulmonary HTN, Aortic atherosclerosis, GERD, Stage 2 COPD, presents today for an acute visit to discuss generalized body aches and chills.   Symptom onset was early this morning with generalized body aches, chills, scratchy throat and headache. She is on continuous 5l of o2 at home. She has noticed that her breathing has been worse for the past few days. She denies any cough, fever, chest pain or difficulty breathing.   She reports her husband and her brother have been sick. She is unsure of what they have. She is UTD on her flu and pneumonia vaccine.    Patient Active Problem List   Diagnosis Date Noted   Abscess of breast 06/06/2023   Transient hypotension 06/06/2023   Lung nodule 05/22/2023   COPD exacerbation (HCC) 05/10/2023   Dyslipidemia 05/10/2023   Pulmonary hypertension (HCC) 05/10/2023   GERD without esophagitis 05/10/2023   PVD (peripheral vascular disease) (HCC) 05/10/2023   Depression 05/10/2023   Peripheral neuropathy 05/10/2023   Acute cough 04/18/2023   Acute intractable tension-type headache 04/18/2023   Positive ANA (antinuclear antibody) 04/18/2023   Viral URI 04/05/2023   Purpura (HCC) 04/05/2023   Carotid stenosis, symptomatic w/o infarct, left 03/07/2023   Research study patient 01/03/2023   Left foot pain 11/30/2022   Acute left ankle pain 11/30/2022   Fall 09/01/2022   Sleep disturbance 09/01/2022   Closed displaced fracture of fifth metatarsal bone of left foot 08/09/2022   Generalized body aches 06/01/2022   Cervical fusion syndrome (C6-7) 04/07/2022   Cervical disc disorder with radiculopathy of cervical region (right)  04/07/2022   Requires continuous at home supplemental oxygen  (3L)  COPD 04/07/2022   Chronic pain syndrome 04/07/2022   Acute on chronic respiratory failure with hypoxemia (HCC) 02/27/2022   Atherosclerosis of native arteries of extremity with intermittent claudication (HCC) 01/28/2022   Preventative health care 12/15/2021   COPD with acute exacerbation (HCC) 10/25/2021   Current mild episode of major depressive disorder without prior episode (HCC) 10/01/2021   Chronic right shoulder pain 08/09/2021   History of cervical spinal surgery 08/09/2021   Radicular pain in right arm 08/09/2021   Numbness and tingling in both hands 06/17/2021   Carotid stenosis 06/15/2021   Hyperlipidemia 06/15/2021   Right foot pain 05/11/2021   Stress incontinence 03/15/2021   Urinary frequency 03/15/2021   Pulsatile tinnitus of left ear 03/15/2021   Chronic cough 12/11/2020   Prediabetes 09/30/2019   Tear of left gluteus minimus tendon 09/30/2019   Aortic atherosclerosis (HCC) 09/30/2019   Emphysema due to alpha-1-antitrypsin deficiency (HCC) 09/16/2019   Cheilitis 08/27/2019   Other microscopic hematuria 08/27/2019   Bronchiectasis without complication (HCC) 04/22/2019   Chronic respiratory failure with hypoxia (HCC) 04/09/2019   Pneumonia due to COVID-19 virus 02/27/2019   COVID-19 virus infection 02/26/2019   Stage 2 moderate COPD by GOLD classification (HCC) 02/26/2019   Syncope 02/26/2019   Orthostatic hypotension 02/26/2019   Left buttock pain 04/12/2018   Anxiety 01/30/2017   Chronic bronchitis (HCC) 07/14/2015   Smoking history 04/15/2015   Benign neoplasm of parathyroid gland 06/04/2012  Enthesopathy of ankle and tarsus 06/04/2012   History of DVT (deep vein thrombosis) 05/21/2010   Past Medical History:  Diagnosis Date   Atherosclerosis 1/0612014   aorta, iliacs and CFA bilaterally no greater than 0-49% - lower arterial doppler.   Blood transfusion without reported diagnosis 2005    Cancer [facility]) Ovarian 1978   Chest pain    COPD (chronic obstructive pulmonary disease) (HCC)    COVID-19 long hauler manifesting chronic dyspnea    Emphysema of lung (HCC) 2019   Oxygen  deficiency 03/2019   Oxygen  dependent    3 liters up to 5   Allergies  Allergen Reactions   Montelukast      Bad dreams         06/30/2023    8:50 AM 03/01/2023    9:01 AM 02/27/2023    4:36 PM  Depression screen PHQ 2/9  Decreased Interest 0 0 0  Down, Depressed, Hopeless 0 0 0  PHQ - 2 Score 0 0 0  Altered sleeping  1   Tired, decreased energy  1   Change in appetite  0   Feeling bad or failure about yourself   0   Trouble concentrating  0   Moving slowly or fidgety/restless  0   Suicidal thoughts  0   PHQ-9 Score  2   Difficult doing work/chores  Very difficult        03/01/2023    9:02 AM 11/30/2022    9:32 AM 09/01/2022    9:27 AM 04/18/2022   12:25 PM  GAD 7 : Generalized Anxiety Score  Nervous, Anxious, on Edge 1 2 1 1   Control/stop worrying 1 1 0 0  Worry too much - different things 1 2 1  0  Trouble relaxing 0 2 2 0  Restless 0 0 0 0  Easily annoyed or irritable 0 2 0 0  Afraid - awful might happen 0 1  1  Total GAD 7 Score 3 10  2   Anxiety Difficulty Very difficult Somewhat difficult Somewhat difficult Somewhat difficult      Review of Systems  Constitutional:  Positive for chills. Negative for fever.  HENT:  Positive for congestion, ear pain, sinus pain and sore throat.        Right ear pain.  Respiratory:  Negative for shortness of breath.   Cardiovascular:  Negative for chest pain.  Gastrointestinal:  Negative for abdominal pain, constipation, diarrhea, heartburn, nausea and vomiting.  Genitourinary:  Negative for dysuria, frequency and urgency.  Neurological:  Negative for dizziness and headaches.  Endo/Heme/Allergies:  Negative for polydipsia.  Psychiatric/Behavioral:  Negative for depression and suicidal ideas. The patient is not nervous/anxious.        Objective:     BP 108/62   Pulse 64   Temp 97.9 F (36.6 C)   Resp 18   Ht 5' 5 (1.651 m)   Wt 146 lb 6 oz (66.4 kg)   SpO2 97%   BMI 24.36 kg/m  BP Readings from Last 3 Encounters:  07/25/23 108/62  07/04/23 138/60  06/30/23 127/66   Wt Readings from Last 3 Encounters:  07/25/23 146 lb 6 oz (66.4 kg)  07/04/23 147 lb 6.4 oz (66.9 kg)  06/30/23 146 lb (66.2 kg)      Physical Exam Vitals and nursing note reviewed.  Constitutional:      Appearance: Normal appearance.  Cardiovascular:     Rate and Rhythm: Normal rate and regular rhythm.     Pulses: Normal pulses.  Heart sounds: Normal heart sounds.  Pulmonary:     Effort: Pulmonary effort is normal.     Breath sounds: Normal breath sounds.  Neurological:     Mental Status: She is alert and oriented to person, place, and time.  Psychiatric:        Mood and Affect: Mood normal.        Behavior: Behavior normal.        Thought Content: Thought content normal.        Judgment: Judgment normal.      Results for orders placed or performed in visit on 07/25/23  POC COVID-19  Result Value Ref Range   SARS Coronavirus 2 Ag Negative Negative  POC Influenza A&B (Binax test)  Result Value Ref Range   Influenza A, POC Negative Negative   Influenza B, POC Negative Negative       The 10-year ASCVD risk score (Arnett DK, et al., 2019) is: 3.8%    Assessment & Plan:  Generalized body aches Assessment & Plan: Covid and flu negative in office today.  Orders: -     POC COVID-19 BinaxNow -     POC Influenza A&B(BINAX/QUICKVUE)  Viral URI Assessment & Plan: Symptoms suggestive of viral URI at this time. Exam stable. No red flags.  Covid and flu negative today.   Recommendations given for OTC medications to try for symptom management.   She will update if symptoms worsen or fail to improve.   ER/UC precautions discussed.      Return if symptoms worsen or fail to improve.    Carrol Aurora, NP

## 2023-07-25 NOTE — Patient Instructions (Addendum)
 You can try a few things over the counter to help with your symptoms including:  Cough: Delsym or Robitussin (get the off brand, works just as well) Chest Congestion: Mucinex (plain) Nasal Congestion/Ear Pressure/Sinus Pressure: Try using Flonase (fluticasone) nasal spray. Instill 1 spray in each nostril twice daily. This can be purchased over the counter. Body aches, fevers, headache: Ibuprofen (not to exceed 2400 mg in 24 hours) or Acetaminophen-Tylenol (not to exceed 3000 mg in 24 hours) Runny Nose/Throat Drainage/Sneezing/Itchy or Watery Eyes: An antihistamine such as Zyrtec, Claritin, Xyzal, Allegra  You should be feeling better by day seven of symptoms, but please do contact me if this is not the case.  It was a pleasure meeting you!

## 2023-07-25 NOTE — Assessment & Plan Note (Addendum)
 Symptoms suggestive of viral URI at this time. Exam stable. No red flags.  Covid and flu negative today.   Recommendations given for OTC medications to try for symptom management.   She will update if symptoms worsen or fail to improve.   ER/UC precautions discussed.

## 2023-07-25 NOTE — Assessment & Plan Note (Signed)
 Covid and flu negative in office today.

## 2023-08-01 ENCOUNTER — Encounter: Payer: Medicare Other | Admitting: *Deleted

## 2023-08-01 DIAGNOSIS — M25511 Pain in right shoulder: Secondary | ICD-10-CM | POA: Diagnosis not present

## 2023-08-01 DIAGNOSIS — Z006 Encounter for examination for normal comparison and control in clinical research program: Secondary | ICD-10-CM

## 2023-08-01 DIAGNOSIS — G894 Chronic pain syndrome: Secondary | ICD-10-CM | POA: Diagnosis not present

## 2023-08-01 DIAGNOSIS — M5010 Cervical disc disorder with radiculopathy, unspecified cervical region: Secondary | ICD-10-CM | POA: Diagnosis not present

## 2023-08-01 DIAGNOSIS — J441 Chronic obstructive pulmonary disease with (acute) exacerbation: Secondary | ICD-10-CM

## 2023-08-01 DIAGNOSIS — S92352S Displaced fracture of fifth metatarsal bone, left foot, sequela: Secondary | ICD-10-CM | POA: Diagnosis not present

## 2023-08-01 DIAGNOSIS — G8929 Other chronic pain: Secondary | ICD-10-CM | POA: Diagnosis not present

## 2023-08-01 MED ORDER — STUDY - ARNASA GB44332 - ASTEGOLIMAB 238 MG/1.7 ML OR PLACEBO SQ INJECTION (PI-RAMASWAMY)
476.0000 mg | INJECTION | SUBCUTANEOUS | Status: DC
  Administered 2023-08-01: 476 mg via SUBCUTANEOUS
  Filled 2023-08-01: qty 3.4

## 2023-08-01 NOTE — Research (Signed)
 Title: A Phase III, randomized, double-blind, placebo controlled, multicenter study to evaluate the efficacy and safety of astegolimab in patients with chronic obstructive pulmonary disease    Dose and Duration of Treatment: Astegolimab is presented as a sterile, slightly brown-yellow solution. Each single-use, 2.25 mL pre-filled syringe contains 1.7 mL deliverable volume. Astegolimab drug product is formulated at 140 mg/mL astegolimab with 114 mM succinic acid, 200 mM L-arginine, 10 mM L-methionine, 0.06% (w/v) polysorbate 20, pH 5.7. Placebo for astegolimab is supplied in an identical pre-filled syringe configuration.   Protocol # [id number]   Sponsor: [employer] [phone] [place], [place] Protocol: Version 5 dated 08Nov2024 IB: version 9.0 dated April 2024 ICF: Main version 12Apr2023, revised 17July2023 Mobile Nursing: 01Sep2023, revised 01Sep2023 Lab Manual: V4.0.0 20Oct2023       Investigator Brochure Product: Astegolimab [phone])   Mechanism of action: Astegolimab (also known as [id number] or [id number]) is a fully human, IgG2 monoclonal antibody that binds with high affinity to the interleukin (IL)-33 (IL-33) receptor, ST2, thereby blocking the signaling of IL-33, an inflammatory cytokine of the IL-1 family and member of the alarmin class of molecules. Astegolimab binds with high affinity to the human and cynomolgus monkey receptor for IL-33, ST2, and blocks IL-33 binding, thus inhibiting association with the IL-1R accessory protein (AcP) co-receptor and formation of an activated receptor complex.   Key Inclusion Criteria: Age 86-80 years at Visit 1   Documented COPD diagnosis for >=12 months prior to Visit 1 History of frequent exacerbations, defined as having had 2 or more moderate or severe COPD exacerbations within 12 months prior to Visit 1 Post-bronchodilator FEV1 >=20% and <80% of predicted at Visit 1 or Visit 2  Post-bronchodilator FEV1/FVC  <0.70 at Visit 1 or Visit 2  mMRC score >=2 at screening   Current tobacco smoker or former smoker (having stopped smoking for at least 6 months prior to Visit 1) with a history of smoking >=10 pack-years On optimized COPD maintenance therapy as defined below for >=12 months prior to Visit 1            -Inhaled corticosteroid (ICS) plus long-acting beta-agonist (LABA)           -Long-acting muscarinic antagonist (LAMA) plus LABA           -ICS plus LAMA plus LABA     Integrated Pharmacokinetic/Pharmacodynamic Analysis:  In Studies HA60757, HD59034, and HA59431, exploratory biomarker analysis showed there were consistent decreases in blood eosinophil counts throughout the treatment period, potentially mediated by a direct effect of IL-33 on eosinophil progenitors. In [place], there was no significant difference in fractional exhaled nitric oxide (FeNO) levels (reflecting airway IL-4/IL-13 activity) between astegolimab-treated groups relative to placebo throughout the treatment period. These data suggest that astegolimab has only a limited effect on Type 2 inflammation in asthma. No data are applicable for Study [id number]. No data are available yet for Study HA56688.  Special Warnings/Considerations: Administration of astegolimab, a protein therapeutic, may lead to the development of anti-astegolimab antibodies which could lead to AEs and/or decreased exposure. In non-clinical studies (see Section 4.2), ADA incidence has generally been low and there was no apparent ADA impact on PK and safety in these studies. To date, the immunogenicity rates observed with astegolimab in clinical studies have been relatively low as well (see Immunogenicity Section 5.6). Several clinical studies have been conducted in patients with asthma, atopic dermatitis, COPD (IIS Study HA59431) and COVID-19 severe pneumonia, which generally showed low incidence of ADAs (  Table 31). There has been no correlation between ADA status  and clinical findings or increased incidence of AEs. Astegolimab is now being considered in a larger study for the treatment of COPD. This patient population is typically considered to have a hyper-responsive immune system. Route of administration for this molecule will be [place], either Q2W or Q4W. These factors increase the risk of development of an immune response to astegolimab, specifically with repeat dosing. From the previous IIS Study HA59431, incidence of ADAs was low in COPD patients; this remains to be confirmed in a larger study. To monitor ADA development in ongoing studies, serum samples will be collected from patients at protocol-defined intervals. Patients who test positive for antibodies and have clinical sequelae that are considered potentially related to an ADA response may also be asked to return for additional follow-up testing.  Drug Interaction Studies: No PK drug interaction studies have been conducted to date.  Serious Adverse Reactions Observed in Asthma Study: During the treatment period, a similar proportion of patients across all cohorts experienced at least one AE, regardless of causality (Table 22). In total, 77.2%, 70.9%, 72.2%, and 72.1% of patients reported at least 1 AE in the placebo, 70 mg, 210 mg, and 490 mg groups, respectively. The most common AE's (>5% in any treatment group) were asthma, nasopharyngitis, upper respiratory tract infection, headache, and injection site reaction (ISR). The most common drug-related AE was ISR, which was reported more frequently in the astegolimab treatment groups than in the placebo group (1 [0.8%] patient in the placebo group, 10 [7.9%] patients with 70 mg, 8 [6.3%] patients with 210 mg, 6 [4.9%] patients with 490 mg). All ISRs were non-serious and mild or moderate in severity. During the treatment period, 50 SAEs were reported in 37 (7.4%) patients. The number of patients reporting SAEs was comparable across all cohorts (11 SAEs in 8 patients  on placebo, 21 SAEs in 14 patients on 70 mg, 11 SAEs in 9 patients on 210 mg, and 7 SAEs in 6 patients on 490 mg). The most common SAE was asthma. One SAE of moderate livedo reticularis (70 mg) was considered a suspected unexpected serious adverse drug reaction (SUSAR) related to astegolimab and was reported two days after the second dose, leading to discontinuation of astegolimab. Two (0.4%) patients reported anaphylaxis and hypersensitivity reactions: 1 severe SAE of anaphylactic reaction (placebo), and 1 moderate hypersensitivity (490 mg) considered related to astegolimab. Three (0.6%) patients experienced a potential Major Adverse Cardiac Event (MACE) (1 patient each in the placebo, 70 mg, and 210 mg groups). None of the potential MACE were considered related to astegolimab. Overall, 233 (46.4%) patients reported events of infection. A comparable number of patients reported infection across all treatment groups (65 [51.2%] patients on placebo, 55 [43.3%] patients on 70 mg, 58 [46.0%] patients on 210 mg, and 55 [45.1%] patients on 490 mg). The most frequently reported infection (>=10% incidence) was nasopharyngitis (12.7%). One patient on astegolimab 210 mg and the partner of one patient on placebo became pregnant during the study. Both delivered normal/healthy babies. Two deaths, unrelated to study drug, were reported: one patient on 210 mg astegolimab died following an SAE of asthma; the other patient on 490 mg astegolimab had an unexplained death. There were no clinically meaningful changes in laboratory parameters, vital signs, or ECG results, other than the 10% decrease in mean blood eosinophil counts in the astegolimab-treated groups, with no safety concerns. Treatment-induced ADAs were comparable between the astegolimab groups and had no impact on  safety. Overall, astegolimab was well tolerated at all doses used and had a safety profile consistent with that observed in the previous astegolimab Phase I  studies.  Serious Adverse Reactions Observed in Previous COPD Study: In the completed IIS Study HA59431, a total of 81 patients received at least one dose of astegolimab or placebo. The safety profile of astegolimab was similar to that of placebo. There were a total of 222 AEs reported in 62 patients. A total of 28 (72%) patients in the placebo arm and 34 (81%) patients in the astegolimab arm reported at least one AE. The most commonly reported AEs were headache followed by urinary tract infection and viral upper respiratory tract infection. A total of 39 SAEs were reported in 28 (35%) patients; 16 (41%) patients in the placebo group and 12 (29%) in the astegolimab group. The most commonly reported SAE was hospital admission for community acquired pneumonia. Four SAEs resulted in patient discontinuation from treatment (1 patient in the placebo group and 3 patients in the astegolimab group). One patient in the placebo group experienced an AESI of potential MACE (heart failure, unrelated to the study treatment). No anaphylaxis or pregnancies were reported. Two deaths, unrelated to study treatment, were reported: 1 patient in the placebo group died after hospital acquired pneumonia, and another patient in the placebo group died after pneumonia and type 2 respiratory failure.  Safety Data: Astegolimab has been generally well tolerated. There have been 49 patient deaths across all astegolimab studies (Section 5.5.2), none of which were considered related to astegolimab. A total of 144 subjects experienced a total of 224 SAEs across all astegolimab studies. Of these, an SAE livedo reticularis observed in 1 patient was considered related to astegolimab by the investigator (Section 5.5.3). AEs leading to withdrawal (Section 5.5.4) have generally occurred at a rate similar to what would be expected for clinical trials in the studies' respective indications.   [facility] [place] Clinical Research Coordinator note :     The consent for this encounter is under Protocol Version 2.0 , Investigator Brochure Version 7, Consent Version IRB Approved  revised  and is currently IRB approved.    Subject expressed continued interest and consent in continuing as a study subject. Subject confirmed that there was no change in contact information (e.g. address, telephone, email). Subject thanked for participation in research and contribution to science.  I  The Subject was informed that the PI Dr. Geronimo continues to have oversight of the subject's visits and course  through relevant discussions, reviews and also specifically of this visit by routing of this note to the PI.    [facility] [place] Clinical Research Coordinator note:   This visit for Subject Maria [name] with DOB: 1955/09/13 on 08/01/2023 for the above protocol is Visit/Encounter # week 44  and is for purpose of research.   Subject  expressed continued interest and consent in continuing as a study subject. Subject confirmed that there was  no change in contact information (e.g. address, telephone, email). Subject thanked for participation in research and contribution to science.  The Subject was yes informed that the PI Murali Ramaswamy,MD continues to have oversight of the subject's visits and course through relevant discussions, reviews, and also specifically of this visit by routing of this note to the PI. All assessments and procedures were performed per above stated protocol. Subject tolerated injections well. Please refer to subjects paper source binder for further details of this visit.    Signed by  Samanda Buske [facility]  Clinical Research Coordinator / Nurse [facility]  [place], [place] 11:30 AM 08/01/2023

## 2023-08-04 ENCOUNTER — Other Ambulatory Visit: Payer: Self-pay | Admitting: Nurse Practitioner

## 2023-08-04 DIAGNOSIS — F320 Major depressive disorder, single episode, mild: Secondary | ICD-10-CM

## 2023-08-07 ENCOUNTER — Other Ambulatory Visit: Payer: Self-pay | Admitting: Nurse Practitioner

## 2023-08-07 DIAGNOSIS — G479 Sleep disorder, unspecified: Secondary | ICD-10-CM

## 2023-08-11 NOTE — Telephone Encounter (Signed)
I received a message from [place] with Christoper Allegra see below RE: Insurance change and needed 02 order sent to [facility] order placed 07/20/23 Received: Today [name], [name]   Good morning Patient scheduled to be setup with oxygen on 08/14/23.

## 2023-08-11 NOTE — Telephone Encounter (Signed)
I sent urgent message to [employer] to check on this issue

## 2023-08-15 ENCOUNTER — Encounter: Payer: Medicare HMO | Admitting: *Deleted

## 2023-08-15 DIAGNOSIS — J441 Chronic obstructive pulmonary disease with (acute) exacerbation: Secondary | ICD-10-CM

## 2023-08-15 DIAGNOSIS — Z006 Encounter for examination for normal comparison and control in clinical research program: Secondary | ICD-10-CM

## 2023-08-15 MED ORDER — STUDY - ARNASA GB44332 - ASTEGOLIMAB 238 MG/1.7 ML OR PLACEBO SQ INJECTION (PI-RAMASWAMY)
476.0000 mg | INJECTION | SUBCUTANEOUS | Status: DC
  Administered 2023-08-15: 476 mg via SUBCUTANEOUS
  Filled 2023-08-15: qty 3.4

## 2023-08-15 NOTE — Research (Signed)
Title: A Phase III, randomized, double-blind, placebo controlled, multicenter study to evaluate the efficacy and safety of astegolimab in patients with chronic obstructive pulmonary disease    Dose and Duration of Treatment: Astegolimab is presented as a sterile, slightly brown-yellow solution. Each single-use, 2.25 mL pre-filled syringe contains 1.7 mL deliverable volume. Astegolimab drug product is formulated at 140 mg/mL astegolimab with 114 mM succinic acid, 200 mM L-arginine, 10 mM L-methionine, 0.06% (w/v) polysorbate 20, pH 5.7. Placebo for astegolimab is supplied in an identical pre-filled syringe configuration.   Protocol # [id number]   Sponsor: [employer] [phone] [place], [place] Protocol: Version 5 dated 08Nov2024 IB: version 9.0 dated April 2024 ICF: Main version 12Apr2023, revised 17July2023 Mobile Nursing: 01Sep2023, revised 01Sep2023 Lab Manual: V4.0.0 20Oct2023     Investigator Brochure Product: Astegolimab [phone])   Mechanism of action: Astegolimab (also known as [id number] or [id number]) is a fully human, IgG2 monoclonal antibody that binds with high affinity to the interleukin (IL)-33 (IL-33) receptor, ST2, thereby blocking the signaling of IL-33, an inflammatory cytokine of the IL-1 family and member of the "alarmin" class of molecules. Astegolimab binds with high affinity to the human and cynomolgus monkey receptor for IL-33, ST2, and blocks IL-33 binding, thus inhibiting association with the IL-1R accessory protein (AcP) co-receptor and formation of an activated receptor complex.   Key Inclusion Criteria: Age 42-80 years at Visit 1   Documented COPD diagnosis for >=12 months prior to Visit 1 History of frequent exacerbations, defined as having had 2 or more moderate or severe COPD exacerbations within 12 months prior to Visit 1 Post-bronchodilator FEV1 >=20% and <80% of predicted at Visit 1 or Visit 2  Post-bronchodilator FEV1/FVC <0.70  at Visit 1 or Visit 2  mMRC score >=2 at screening   Current tobacco smoker or former smoker (having stopped smoking for at least 6 months prior to Visit 1) with a history of smoking >=10 pack-years On optimized COPD maintenance therapy as defined below for >=12 months prior to Visit 1            -Inhaled corticosteroid (ICS) plus long-acting beta-agonist (LABA)           -Long-acting muscarinic antagonist (LAMA) plus LABA           -ICS plus LAMA plus LABA  Key Exclusion Criteria: Current documented diagnosis of asthma  Diagnosis of a-1 antitrypsin deficiency  History of long-term treatment with oxygen at >4.0 liters/minute  Any infection that resulted in hospital admission for >=24 hours and/or treatment with oral, IV, or IM antibiotics within 4 weeks prior to Visit 1 or during screening Upper or lower respiratory tract infection within 4 weeks prior to Visit 1 or during screening Treatment with oral, IV, or IM corticosteroids (>10 mg/day prednisolone or equivalent) within 4 weeks prior to initiation of study drug Treatment with a licensed biologic agent (e.g., omalizumab, dupilumab, and/or anti-IL-5 therapies) within 3 months or 5 drug-elimination half-lives (whichever is longer) prior to screening  Planned surgical intervention during the study Known immunodeficiency, including but not limited to, HIV infection  AST, ALT, or total bilirubin elevation >=2.0 x the upper limit of normal (ULN) during screening  History of malignancy within 5 years prior to screening, with the exception of malignancies with a negligible risk of metastasis or death (e.g., 5-year overall survival rate >90%), such as adequately treated carcinoma in situ of the cervix, non-melanoma carcinoma, localized prostate cancer, or ductal carcinoma in situ Unstable cardiac disease, myocardial  infarction, or New York Heart Association Class III or IV heart failure within 12 months prior to screening History or absence of an  abnormal ECG that is deemed clinically significant by the investigator, including complete left bundle branch block or second- or third-degree atrioventricular heart block     Integrated Pharmacokinetic/Pharmacodynamic Analysis:  In Studies ZO10960, AV40981, and XB14782, exploratory biomarker analysis showed there were consistent decreases in blood eosinophil counts throughout the treatment period, potentially mediated by a direct effect of IL-33 on eosinophil progenitors. In [place], there was no significant difference in fractional exhaled nitric oxide (FeNO) levels (reflecting airway IL-4/IL-13 activity) between astegolimab-treated groups relative to placebo throughout the treatment period. These data suggest that astegolimab has only a limited effect on Type 2 inflammation in asthma. No data are applicable for Study NF62130. No data are available yet for Study [id number].  Special Warnings/Considerations: Administration of astegolimab, a protein therapeutic, may lead to the development of anti-astegolimab antibodies which could lead to AEs and/or decreased exposure. In non-clinical studies (see Section 4.2), ADA incidence has generally been low and there was no apparent ADA impact on PK and safety in these studies. To date, the immunogenicity rates observed with astegolimab in clinical studies have been relatively low as well (see Immunogenicity Section 5.6). Several clinical studies have been conducted in patients with asthma, atopic dermatitis, COPD (IIS Study NG29528) and COVID-19 severe pneumonia, which generally showed low incidence of ADAs (Table 31). There has been no correlation between ADA status and clinical findings or increased incidence of AEs. Astegolimab is now being considered in a larger study for the treatment of COPD. This patient population is typically considered to have a hyper-responsive immune system. Route of administration for this molecule will be [place], either Q2W or Q4W. These  factors increase the risk of development of an immune response to astegolimab, specifically with repeat dosing. From the previous IIS Study [id number], incidence of ADAs was low in COPD patients; this remains to be confirmed in a larger study. To monitor ADA development in ongoing studies, serum samples will be collected from patients at protocol-defined intervals. Patients who test positive for antibodies and have clinical sequelae that are considered potentially related to an ADA response may also be asked to return for additional follow-up testing.  Drug Interaction Studies: No PK drug interaction studies have been conducted to date.  Serious Adverse Reactions Observed in Asthma Study: During the treatment period, a similar proportion of patients across all cohorts experienced at least one AE, regardless of causality (Table 22). In total, 77.2%, 70.9%, 72.2%, and 72.1% of patients reported at least 1 AE in the placebo, 70 mg, 210 mg, and 490 mg groups, respectively. The most common AE's (>5% in any treatment group) were asthma, nasopharyngitis, upper respiratory tract infection, headache, and injection site reaction (ISR). The most common drug-related AE was ISR, which was reported more frequently in the astegolimab treatment groups than in the placebo group (1 [0.8%] patient in the placebo group, 10 [7.9%] patients with 70 mg, 8 [6.3%] patients with 210 mg, 6 [4.9%] patients with 490 mg). All ISRs were non-serious and mild or moderate in severity. During the treatment period, 50 SAEs were reported in 37 (7.4%) patients. The number of patients reporting SAEs was comparable across all cohorts (11 SAEs in 8 patients on placebo, 21 SAEs in 14 patients on 70 mg, 11 SAEs in 9 patients on 210 mg, and 7 SAEs in 6 patients on 490 mg). The most common SAE was  asthma. One SAE of moderate livedo reticularis (70 mg) was considered a suspected unexpected serious adverse drug reaction (SUSAR) related to astegolimab and was  reported two days after the second dose, leading to discontinuation of astegolimab. Two (0.4%) patients reported anaphylaxis and hypersensitivity reactions: 1 severe SAE of anaphylactic reaction (placebo), and 1 moderate hypersensitivity (490 mg) considered related to astegolimab. Three (0.6%) patients experienced a potential Major Adverse Cardiac Event (MACE) (1 patient each in the placebo, 70 mg, and 210 mg groups). None of the potential MACE were considered related to astegolimab. Overall, 233 (46.4%) patients reported events of infection. A comparable number of patients reported infection across all treatment groups (65 [51.2%] patients on placebo, 55 [43.3%] patients on 70 mg, 58 [46.0%] patients on 210 mg, and 55 [45.1%] patients on 490 mg). The most frequently reported infection (>=10% incidence) was nasopharyngitis (12.7%). One patient on astegolimab 210 mg and the partner of one patient on placebo became pregnant during the study. Both delivered normal/healthy babies. Two deaths, unrelated to study drug, were reported: one patient on 210 mg astegolimab died following an SAE of asthma; the other patient on 490 mg astegolimab had an unexplained death. There were no clinically meaningful changes in laboratory parameters, vital signs, or ECG results, other than the 10% decrease in mean blood eosinophil counts in the astegolimab-treated groups, with no safety concerns. Treatment-induced ADAs were comparable between the astegolimab groups and had no impact on safety. Overall, astegolimab was well tolerated at all doses used and had a safety profile consistent with that observed in the previous astegolimab Phase I studies.  Serious Adverse Reactions Observed in Previous COPD Study: In the completed IIS Study [id number], a total of 81 patients received at least one dose of astegolimab or placebo. The safety profile of astegolimab was similar to that of placebo. There were a total of 222 AEs reported in 62  patients. A total of 28 (72%) patients in the placebo arm and 34 (81%) patients in the astegolimab arm reported at least one AE. The most commonly reported AEs were headache followed by urinary tract infection and viral upper respiratory tract infection. A total of 39 SAEs were reported in 28 (35%) patients; 16 (41%) patients in the placebo group and 12 (29%) in the astegolimab group. The most commonly reported SAE was hospital admission for community acquired pneumonia. Four SAEs resulted in patient discontinuation from treatment (1 patient in the placebo group and 3 patients in the astegolimab group). One patient in the placebo group experienced an AESI of potential MACE (heart failure, unrelated to the study treatment). No anaphylaxis or pregnancies were reported. Two deaths, unrelated to study treatment, were reported: 1 patient in the placebo group died after hospital acquired pneumonia, and another patient in the placebo group died after pneumonia and type 2 respiratory failure.  Safety Data: Astegolimab has been generally well tolerated. There have been 49 patient deaths across all astegolimab studies (Section 5.5.2), none of which were considered related to astegolimab. A total of 144 subjects experienced a total of 224 SAEs across all astegolimab studies. Of these, an SAE livedo reticularis observed in 1 patient was considered related to astegolimab by the investigator (Section 5.5.3). AEs leading to withdrawal (Section 5.5.4) have generally occurred at a rate similar to what would be expected for clinical trials in the studies' respective indications.   [facility] Clinical Research Coordinator note :    This visit for Subject [id number] with DOB: Dec 07, 1955 on 15 Aug 2023 for the above protocol is Visit/Encounter #  week 46 and is for purpose of research.      Subject expressed continued interest and consent in continuing as a study subject. Subject confirmed that there was no change in  contact information (e.g. address, telephone, email). Subject thanked for participation in research and contribution to science.  The Subject was informed that the PI Dr. Marchelle Gearing continues to have oversight of the subject's visits and course  through relevant discussions, reviews and also specifically of this visit by routing of this note to the PI.   All assessments and procedures were performed per above stated protocol. Subject tolerated injections well. Please refer to subjects paper source binder for further details of this visit.   Signed by Laurelyn Sickle Clinical Research Coordinator  [facility]  [place], [place]

## 2023-08-20 ENCOUNTER — Other Ambulatory Visit: Payer: Self-pay | Admitting: Nurse Practitioner

## 2023-08-20 DIAGNOSIS — F320 Major depressive disorder, single episode, mild: Secondary | ICD-10-CM

## 2023-08-21 ENCOUNTER — Telehealth: Payer: Self-pay | Admitting: Acute Care

## 2023-08-21 NOTE — Telephone Encounter (Signed)
[name], Margarito Courser, Kathee Delton; [name], [name]; Loraine Grip; [place], Lurena Joiner Hello Patient does have 02 in the home and is needing to transition. Looking for all qualifying information to submit and this will go thru [facility] for authorization.

## 2023-08-22 ENCOUNTER — Ambulatory Visit: Payer: Medicare Other | Admitting: Physician Assistant

## 2023-08-23 ENCOUNTER — Encounter: Payer: Self-pay | Admitting: Internal Medicine

## 2023-08-23 ENCOUNTER — Ambulatory Visit: Payer: Medicare Other | Admitting: Physician Assistant

## 2023-08-23 VITALS — BP 132/73 | HR 73 | Ht 65.0 in | Wt 143.0 lb

## 2023-08-23 DIAGNOSIS — N3946 Mixed incontinence: Secondary | ICD-10-CM

## 2023-08-23 DIAGNOSIS — R300 Dysuria: Secondary | ICD-10-CM

## 2023-08-23 LAB — URINALYSIS, COMPLETE
Bilirubin, UA: NEGATIVE
Glucose, UA: NEGATIVE
Ketones, UA: NEGATIVE
Leukocytes,UA: NEGATIVE
Nitrite, UA: NEGATIVE
Protein,UA: NEGATIVE
Specific Gravity, UA: 1.02 (ref 1.005–1.030)
Urobilinogen, Ur: 1 mg/dL (ref 0.2–1.0)
pH, UA: 7 (ref 5.0–7.5)

## 2023-08-23 LAB — MICROSCOPIC EXAMINATION

## 2023-08-23 NOTE — Progress Notes (Signed)
 08/23/2023 10:57 AM   Maria [name] 1956-07-07 [id number]  CC: Chief Complaint  Patient presents with   Over Active Bladder   HPI: Maria [name] is a 68 y.o. female with PMH mixed incontinence on oxybutynin  and PTNS maintenance who presents today for monthly maintenance PTNS and evaluation of UTI.   Today she reports 4 days of dysuria that has been responding to Azo.  PMH: Past Medical History:  Diagnosis Date   Atherosclerosis 1/0612014   aorta, iliacs and CFA bilaterally no greater than 0-49% - lower arterial doppler.   Blood transfusion without reported diagnosis 2005   Cancer [facility]) Ovarian 1978   Chest pain    COPD (chronic obstructive pulmonary disease) (HCC)    COVID-19 long hauler manifesting chronic dyspnea    Emphysema of lung (HCC) 2019   Oxygen  deficiency 03/2019   Oxygen  dependent    3 liters up to 5    Surgical History: Past Surgical History:  Procedure Laterality Date   ABDOMINAL HYSTERECTOMY  1978   BREAST BIOPSY Left 04/04/2023   Stereo bx, X clip, path pending   BREAST BIOPSY Left 04/04/2023   MM LT BREAST BX W LOC DEV 1ST LESION IMAGE BX SPEC STEREO GUIDE 04/04/2023 [facility]   CAROTID PTA/STENT INTERVENTION Left 03/07/2023   Procedure: CAROTID PTA/STENT INTERVENTION;  Surgeon: Jama Cordella KANDICE, MD;  Location: [facility] INVASIVE CV LAB;  Service: Cardiovascular;  Laterality: Left;   CHOLECYSTECTOMY  2010   COLONOSCOPY     COLONOSCOPY     COLONOSCOPY WITH PROPOFOL  N/A 11/01/2021   Procedure: COLONOSCOPY WITH PROPOFOL ;  Surgeon: Therisa Bi, MD;  Location: [facility] ENDOSCOPY;  Service: Gastroenterology;  Laterality: N/A;   ESOPHAGOGASTRODUODENOSCOPY     NECK SURGERY  1999   OVARIAN CYST REMOVAL  1988   SPINE SURGERY  Neck 2002    Home Medications:  Allergies as of 08/23/2023       Reactions   Montelukast     Bad dreams        Medication List        Accurate as of August 23, 2023 10:57 AM. If you have any questions, ask your nurse or  doctor.          albuterol  108 (90 Base) MCG/ACT inhaler Commonly known as: VENTOLIN  HFA TAKE 2 PUFFS BY MOUTH EVERY 6 HOURS AS NEEDED FOR WHEEZE OR SHORTNESS OF BREATH   Aspirin  Low Dose 81 MG tablet Generic drug: aspirin  EC TAKE 1 TABLET BY MOUTH EVERY DAY   atorvastatin  10 MG tablet Commonly known as: LIPITOR Take 1 tablet (10 mg total) by mouth daily.   Azelastine  HCl 137 MCG/SPRAY Soln USE 1 SPRAY INTO EACH NOSTRIL TWICE A DAY   benzonatate  200 MG capsule Commonly known as: TESSALON  Take 1 capsule by mouth three times daily as needed   Breztri  Aerosphere 160-9-4.8 MCG/ACT Aero Generic drug: Budeson-Glycopyrrol-Formoterol  Inhale 2 puffs into the lungs in the morning and at bedtime.   clopidogrel  75 MG tablet Commonly known as: Plavix  Take 1 tablet (75 mg total) by mouth daily.   escitalopram  20 MG tablet Commonly known as: LEXAPRO  Take 1 tablet by mouth once daily   Flutter Devi Use as directed   gabapentin  600 MG tablet Commonly known as: Neurontin  Take 1 tablet (600 mg total) by mouth every 8 (eight) hours.   hydrOXYzine  25 MG capsule Commonly known as: VISTARIL  TAKE 1 CAPSULE BY MOUTH AT BEDTIME AS NEEDED   ipratropium-albuterol  0.5-2.5 (3) MG/3ML Soln Commonly known as: DUONEB  USE 1 AMPULE IN NEBULIZER 4 TIMES A DAY AS NEEDED    (Dx: J84.112, J44.9)   methocarbamol  500 MG tablet Commonly known as: ROBAXIN  Take 1 tablet (500 mg total) by mouth at bedtime as needed for muscle spasms.   oxybutynin  10 MG 24 hr tablet Commonly known as: DITROPAN -XL Take 1 tablet (10 mg total) by mouth daily.   OXYGEN  Inhale into the lungs. 3 liters at rest and 5 liters with any activity.-   pantoprazole  40 MG tablet Commonly known as: PROTONIX  Take 1 tablet by mouth once daily   Potassium 99 MG Tabs Take 99 mg by mouth daily.   roflumilast  500 MCG Tabs tablet Commonly known as: DALIRESP  Take 1 tablet (500 mcg total) by mouth daily.   sodium chloride   HYPERTONIC 3 % nebulizer solution Take by nebulization 2 (two) times daily.   Vitamin D -3 125 MCG (5000 UT) Tabs Take 5,000 Units by mouth every evening.        Allergies:  Allergies  Allergen Reactions   Montelukast      Bad dreams    Family History: Family History  Problem Relation Age of Onset   Heart failure Mother    Diabetes Mother    Kidney disease Mother    Hypertension Mother    Arthritis Mother    Heart disease Mother    Obesity Mother    COPD Father    Hearing loss Father    Lung cancer Brother    COPD Brother    COPD Brother    Asthma Son    Diabetes Son    Birth defects Son    Breast cancer Maternal Aunt 40    Social History:   reports that she quit smoking about 7 years ago. Her smoking use included cigarettes. She started smoking about 52 years ago. She has a 45 pack-year smoking history. She has been exposed to tobacco smoke. She has never used smokeless tobacco. She reports that she does not drink alcohol and does not use drugs.  Physical Exam: BP 132/73   Pulse 73   Ht 5' 5 (1.651 m)   Wt 143 lb (64.9 kg)   BMI 23.80 kg/m   Constitutional:  Alert and oriented, no acute distress, nontoxic appearing HEENT: [place], AT Cardiovascular: No clubbing, cyanosis, or edema Respiratory: Normal respiratory effort, no increased work of breathing Skin: No rashes, bruises or suspicious lesions Neurologic: Grossly intact, no focal deficits, moving all 4 extremities Psychiatric: Normal mood and affect  Assessment & Plan:   1. Mixed incontinence (Primary) Monthly maintenance PTNS done today, see separate procedure note.  Will see her back next month as scheduled. - PTNS-Percutaneous Tibial Nerve Stimulati  2. Dysuria 4 days of dysuria.  I asked her to leave a urine sample on the way out today and we will contact her with the results, treating as indicated. - Urinalysis, Complete - CULTURE, URINE COMPREHENSIVE  Return in about 4 weeks (around 09/20/2023) for  PTNS maintenance, Will call with results.  Lucie Hones, PA-C  [facility] [place] [address] [place], [place] [address] [phone]

## 2023-08-23 NOTE — Progress Notes (Signed)
 PTNS  Session # 15  Health & Social Factors: Dysuria, this week, see separate note Caffeine: 4 Alcohol: 0 Daytime voids #per day: 3 Night-time voids #per night: 2 Urgency: mild Incontinence Episodes #per day: 2-3 Ankle used: right Treatment Setting: 6 Feeling/ Response: both Comments: Patient tolerated well.  Performed By: Ski Polich, PA-C   Follow Up: Return in about 4 weeks (around 09/20/2023) for PTNS maintenance, Will call with results.

## 2023-08-26 LAB — CULTURE, URINE COMPREHENSIVE

## 2023-08-28 ENCOUNTER — Other Ambulatory Visit: Payer: Self-pay | Admitting: *Deleted

## 2023-08-28 ENCOUNTER — Telehealth: Payer: Self-pay | Admitting: *Deleted

## 2023-08-28 DIAGNOSIS — J9611 Chronic respiratory failure with hypoxia: Secondary | ICD-10-CM

## 2023-08-28 DIAGNOSIS — J449 Chronic obstructive pulmonary disease, unspecified: Secondary | ICD-10-CM

## 2023-08-28 NOTE — Telephone Encounter (Signed)
 Called and spoke with patient regarding POC, she states that she had a POC with the other company, however, she currently has tanks.  She walks with a cane and cannot pull the tank and walk with a cane.  She would like to get a POC from [employer].  I advised her I would send in another order to [employer].  She verbalized understanding.  New order sent to [employer].

## 2023-08-29 ENCOUNTER — Encounter: Payer: Medicare HMO | Admitting: *Deleted

## 2023-08-29 DIAGNOSIS — Z006 Encounter for examination for normal comparison and control in clinical research program: Secondary | ICD-10-CM

## 2023-08-29 DIAGNOSIS — J441 Chronic obstructive pulmonary disease with (acute) exacerbation: Secondary | ICD-10-CM

## 2023-08-29 MED ORDER — STUDY - ARNASA GB44332 - ASTEGOLIMAB 238 MG/1.7 ML OR PLACEBO SQ INJECTION (PI-RAMASWAMY)
476.0000 mg | INJECTION | SUBCUTANEOUS | Status: DC
  Administered 2023-08-29: 476 mg via SUBCUTANEOUS
  Filled 2023-08-29: qty 3.4

## 2023-08-29 NOTE — Research (Signed)
Title: A Phase III, randomized, double-blind, placebo controlled, multicenter study to evaluate the efficacy and safety of astegolimab in patients with chronic obstructive pulmonary disease    Dose and Duration of Treatment: Astegolimab is presented as a sterile, slightly brown-yellow solution. Each single-use, 2.25 mL pre-filled syringe contains 1.7 mL deliverable volume. Astegolimab drug product is formulated at 140 mg/mL astegolimab with 114 mM succinic acid, 200 mM L-arginine, 10 mM L-methionine, 0.06% (w/v) polysorbate 20, pH 5.7. Placebo for astegolimab is supplied in an identical pre-filled syringe configuration.   Protocol # [id number]   Sponsor: [employer] [id number] [place], [place]   Protocol: Version 5 dated 08Nov2024 IB: version 9.0 dated April 2024 ICF: Main version 12Apr2023, revised 17July2023 Mobile Nursing: 01Sep2023, revised 01Sep2023 Lab Manual: V4.0.0 20Oct2023   Mechanism of action: Astegolimab (also known as [id number] or [id number]) is a fully human, IgG2 monoclonal antibody that binds with high affinity to the interleukin (IL)-33 (IL-33) receptor, ST2, thereby blocking the signaling of IL-33, an inflammatory cytokine of the IL-1 family and member of the "alarmin" class of molecules. Astegolimab binds with high affinity to the human and cynomolgus monkey receptor for IL-33, ST2, and blocks IL-33 binding, thus inhibiting association with the IL-1R accessory protein (AcP) co-receptor and formation of an activated receptor complex.   Key Inclusion Criteria: Age 10-80 years at Visit 1   Documented COPD diagnosis for >=12 months prior to Visit 1 History of frequent exacerbations, defined as having had 2 or more moderate or severe COPD exacerbations within 12 months prior to Visit 1 Post-bronchodilator FEV1 >=20% and <80% of predicted at Visit 1 or Visit 2  Post-bronchodilator FEV1/FVC <0.70 at Visit 1 or Visit 2  mMRC score >=2 at screening    Current tobacco smoker or former smoker (having stopped smoking for at least 6 months prior to Visit 1) with a history of smoking >=10 pack-years On optimized COPD maintenance therapy as defined below for >=12 months prior to Visit 1            -Inhaled corticosteroid (ICS) plus long-acting beta-agonist (LABA)           -Long-acting muscarinic antagonist (LAMA) plus LABA           -ICS plus LAMA plus LABA  Key Exclusion Criteria: Current documented diagnosis of asthma  Diagnosis of a-1 antitrypsin deficiency  History of long-term treatment with oxygen at >4.0 liters/minute  Any infection that resulted in hospital admission for >=24 hours and/or treatment with oral, IV, or IM antibiotics within 4 weeks prior to Visit 1 or during screening Upper or lower respiratory tract infection within 4 weeks prior to Visit 1 or during screening Treatment with oral, IV, or IM corticosteroids (>10 mg/day prednisolone or equivalent) within 4 weeks prior to initiation of study drug Treatment with a licensed biologic agent (e.g., omalizumab, dupilumab, and/or anti-IL-5 therapies) within 3 months or 5 drug-elimination half-lives (whichever is longer) prior to screening  Planned surgical intervention during the study Known immunodeficiency, including but not limited to, HIV infection  AST, ALT, or total bilirubin elevation >=2.0 x the upper limit of normal (ULN) during screening  History of malignancy within 5 years prior to screening, with the exception of malignancies with a negligible risk of metastasis or death (e.g., 5-year overall survival rate >90%), such as adequately treated carcinoma in situ of the cervix, non-melanoma carcinoma, localized prostate cancer, or ductal carcinoma in situ Unstable cardiac disease, myocardial infarction, or New York Heart Association Class  III or IV heart failure within 12 months prior to screening History or absence of an abnormal ECG that is deemed clinically significant by  the investigator, including complete left bundle branch block or second- or third-degree atrioventricular heart block     Integrated Pharmacokinetic/Pharmacodynamic Analysis:  In Studies [id number], [id number], and [id number], exploratory biomarker analysis showed there were consistent decreases in blood eosinophil counts throughout the treatment period, potentially mediated by a direct effect of IL-33 on eosinophil progenitors. In [place], there was no significant difference in fractional exhaled nitric oxide (FeNO) levels (reflecting airway IL-4/IL-13 activity) between astegolimab-treated groups relative to placebo throughout the treatment period. These data suggest that astegolimab has only a limited effect on Type 2 inflammation in asthma. No data are applicable for Study [id number]. No data are available yet for Study [id number].  Special Warnings/Considerations: Administration of astegolimab, a protein therapeutic, may lead to the development of anti-astegolimab antibodies which could lead to AEs and/or decreased exposure. In non-clinical studies (see Section 4.2), ADA incidence has generally been low and there was no apparent ADA impact on PK and safety in these studies. To date, the immunogenicity rates observed with astegolimab in clinical studies have been relatively low as well (see Immunogenicity Section 5.6). Several clinical studies have been conducted in patients with asthma, atopic dermatitis, COPD (IIS Study NG29528) and COVID-19 severe pneumonia, which generally showed low incidence of ADAs (Table 31). There has been no correlation between ADA status and clinical findings or increased incidence of AEs. Astegolimab is now being considered in a larger study for the treatment of COPD. This patient population is typically considered to have a hyper-responsive immune system. Route of administration for this molecule will be [place], either Q2W or Q4W. These factors increase the risk of development of an immune  response to astegolimab, specifically with repeat dosing. From the previous IIS Study [id number], incidence of ADAs was low in COPD patients; this remains to be confirmed in a larger study. To monitor ADA development in ongoing studies, serum samples will be collected from patients at protocol-defined intervals. Patients who test positive for antibodies and have clinical sequelae that are considered potentially related to an ADA response may also be asked to return for additional follow-up testing.  Drug Interaction Studies: No PK drug interaction studies have been conducted to date.  Serious Adverse Reactions Observed in Asthma Study: During the treatment period, a similar proportion of patients across all cohorts experienced at least one AE, regardless of causality (Table 22). In total, 77.2%, 70.9%, 72.2%, and 72.1% of patients reported at least 1 AE in the placebo, 70 mg, 210 mg, and 490 mg groups, respectively. The most common AE's (>5% in any treatment group) were asthma, nasopharyngitis, upper respiratory tract infection, headache, and injection site reaction (ISR). The most common drug-related AE was ISR, which was reported more frequently in the astegolimab treatment groups than in the placebo group (1 [0.8%] patient in the placebo group, 10 [7.9%] patients with 70 mg, 8 [6.3%] patients with 210 mg, 6 [4.9%] patients with 490 mg). All ISRs were non-serious and mild or moderate in severity. During the treatment period, 50 SAEs were reported in 37 (7.4%) patients. The number of patients reporting SAEs was comparable across all cohorts (11 SAEs in 8 patients on placebo, 21 SAEs in 14 patients on 70 mg, 11 SAEs in 9 patients on 210 mg, and 7 SAEs in 6 patients on 490 mg). The most common SAE was asthma. One SAE of moderate livedo reticularis (  70 mg) was considered a suspected unexpected serious adverse drug reaction (SUSAR) related to astegolimab and was reported two days after the second dose, leading to  discontinuation of astegolimab. Two (0.4%) patients reported anaphylaxis and hypersensitivity reactions: 1 severe SAE of anaphylactic reaction (placebo), and 1 moderate hypersensitivity (490 mg) considered related to astegolimab. Three (0.6%) patients experienced a potential Major Adverse Cardiac Event (MACE) (1 patient each in the placebo, 70 mg, and 210 mg groups). None of the potential MACE were considered related to astegolimab. Overall, 233 (46.4%) patients reported events of infection. A comparable number of patients reported infection across all treatment groups (65 [51.2%] patients on placebo, 55 [43.3%] patients on 70 mg, 58 [46.0%] patients on 210 mg, and 55 [45.1%] patients on 490 mg). The most frequently reported infection (>=10% incidence) was nasopharyngitis (12.7%). One patient on astegolimab 210 mg and the partner of one patient on placebo became pregnant during the study. Both delivered normal/healthy babies. Two deaths, unrelated to study drug, were reported: one patient on 210 mg astegolimab died following an SAE of asthma; the other patient on 490 mg astegolimab had an unexplained death. There were no clinically meaningful changes in laboratory parameters, vital signs, or ECG results, other than the 10% decrease in mean blood eosinophil counts in the astegolimab-treated groups, with no safety concerns. Treatment-induced ADAs were comparable between the astegolimab groups and had no impact on safety. Overall, astegolimab was well tolerated at all doses used and had a safety profile consistent with that observed in the previous astegolimab Phase I studies.  Serious Adverse Reactions Observed in Previous COPD Study: In the completed IIS Study [id number], a total of 81 patients received at least one dose of astegolimab or placebo. The safety profile of astegolimab was similar to that of placebo. There were a total of 222 AEs reported in 62 patients. A total of 28 (72%) patients in the placebo arm  and 34 (81%) patients in the astegolimab arm reported at least one AE. The most commonly reported AEs were headache followed by urinary tract infection and viral upper respiratory tract infection. A total of 39 SAEs were reported in 28 (35%) patients; 16 (41%) patients in the placebo group and 12 (29%) in the astegolimab group. The most commonly reported SAE was hospital admission for community acquired pneumonia. Four SAEs resulted in patient discontinuation from treatment (1 patient in the placebo group and 3 patients in the astegolimab group). One patient in the placebo group experienced an AESI of potential MACE (heart failure, unrelated to the study treatment). No anaphylaxis or pregnancies were reported. Two deaths, unrelated to study treatment, were reported: 1 patient in the placebo group died after hospital acquired pneumonia, and another patient in the placebo group died after pneumonia and type 2 respiratory failure.  Safety Data: Astegolimab has been generally well tolerated. There have been 49 patient deaths across all astegolimab studies (Section 5.5.2), none of which were considered related to astegolimab. A total of 144 subjects experienced a total of 224 SAEs across all astegolimab studies. Of these, an SAE livedo reticularis observed in 1 patient was considered related to astegolimab by the investigator (Section 5.5.3). AEs leading to withdrawal (Section 5.5.4) have generally occurred at a rate similar to what would be expected for clinical trials in the studies' respective indications.   [facility] [place] Clinical Research Coordinator note :    This visit for Subject [id number] with DOB: [birth year] on 29 Aug 2023  for the above protocol is  Visit/Encounter #  week 48 and is for purpose of research.    The consent for this encounter is under Protocol Version 2.0 , Investigator Brochure Version 7, Consent Version IRB Approved  revised  and is currently IRB approved.    Subject expressed  continued interest and consent in continuing as a study subject. Subject confirmed that there was no change in contact information (e.g. address, telephone, email). Subject thanked for participation in research and contribution to science  The Subject was informed that the PI Dr. Marchelle Gearing continues to have oversight of the subject's visits and course  through relevant discussions, reviews and also specifically of this visit by routing of this note to the PI. All procedures and assessments were performed per above stated protocol.. Subject tolerated injections well. Please refer to subjects paper source binder for further details of this visit.     Signed by Laurelyn Sickle Clinical Research Coordinator  [facility]  [place], [place]

## 2023-09-01 DIAGNOSIS — M25511 Pain in right shoulder: Secondary | ICD-10-CM | POA: Diagnosis not present

## 2023-09-01 DIAGNOSIS — G894 Chronic pain syndrome: Secondary | ICD-10-CM | POA: Diagnosis not present

## 2023-09-01 DIAGNOSIS — S92352S Displaced fracture of fifth metatarsal bone, left foot, sequela: Secondary | ICD-10-CM | POA: Diagnosis not present

## 2023-09-01 NOTE — Telephone Encounter (Signed)
I'm not sure what [facility] needed from Chantel C., but it seems they are reviewing all available patient information and will be submitting it through [place] for authorization. After reaching out to [facility] for the referral, I found out that neither [facility] or [employer] offers a 5L POC. The Pt will have to pay out of pocket for this. I'll contact the patient to inform them.

## 2023-09-01 NOTE — Telephone Encounter (Signed)
[name] is unable to provide a 5L POC. I spoke with [facility], and they mentioned that during the delivery discussion with the patient, they explained they could offer an Oxygen Conserving Device (OCD) set to 5L. However, the order needs to be updated by the provider to reflect this. Maria [name] also suggested contacting them at [phone] for further assistance with this order.

## 2023-09-02 ENCOUNTER — Other Ambulatory Visit: Payer: Self-pay | Admitting: Nurse Practitioner

## 2023-09-02 DIAGNOSIS — G479 Sleep disorder, unspecified: Secondary | ICD-10-CM

## 2023-09-05 ENCOUNTER — Telehealth: Payer: Self-pay | Admitting: Primary Care

## 2023-09-05 NOTE — Telephone Encounter (Signed)
Can we please process PA for roflumilast , medication is no longer covered by patients insurance

## 2023-09-05 NOTE — Telephone Encounter (Signed)
Ok please change the order and call [facility] to facilitate this. I will co-sign

## 2023-09-07 ENCOUNTER — Other Ambulatory Visit (HOSPITAL_COMMUNITY): Payer: Self-pay

## 2023-09-07 NOTE — Telephone Encounter (Signed)
Spoke to [place] with [facility]. Order has been placed as instructed by [place].  Nothing further needed.

## 2023-09-07 NOTE — Addendum Note (Signed)
Addended by: Lajoyce Lauber A on: 09/07/2023 11:21 AM   Modules accepted: Orders

## 2023-09-07 NOTE — Telephone Encounter (Signed)
 Thanks

## 2023-09-07 NOTE — Telephone Encounter (Signed)
This was filled at pharmacy and sold 09-02-2023 for a quantity of 30.

## 2023-09-08 NOTE — Telephone Encounter (Signed)
Sent a message to [place]. Will provide an update.

## 2023-09-11 ENCOUNTER — Telehealth: Payer: Self-pay | Admitting: Internal Medicine

## 2023-09-11 DIAGNOSIS — J9611 Chronic respiratory failure with hypoxia: Secondary | ICD-10-CM

## 2023-09-11 NOTE — Telephone Encounter (Signed)
 This is the response from [facility]:  There are a lot of settings on this one order. From the conversation I had with your office the order is supposed to be stationary continuous 3 lpm and portable with OCD at a setting of 5.  Can order be redone?

## 2023-09-12 ENCOUNTER — Encounter: Payer: Medicare HMO | Admitting: *Deleted

## 2023-09-12 DIAGNOSIS — J441 Chronic obstructive pulmonary disease with (acute) exacerbation: Secondary | ICD-10-CM

## 2023-09-12 DIAGNOSIS — Z006 Encounter for examination for normal comparison and control in clinical research program: Secondary | ICD-10-CM

## 2023-09-12 MED ORDER — STUDY - ARNASA GB44332 - ASTEGOLIMAB 238 MG/1.7 ML OR PLACEBO SQ INJECTION (PI-RAMASWAMY)
476.0000 mg | INJECTION | SUBCUTANEOUS | Status: AC
  Administered 2023-09-12: 476 mg via SUBCUTANEOUS
  Filled 2023-09-12: qty 3.4

## 2023-09-12 NOTE — Telephone Encounter (Signed)
 Good morning,   I pulled the order for ocd at setting of 5. We do have an ocd that will accommodate this setting, but we do not have a portable oxygen concentrator that will do that setting long term.   We will have the ocd delivered to the patient.   Thank you,    NFN

## 2023-09-12 NOTE — Telephone Encounter (Signed)
 Dr Marchelle Gearing,   Molli Knock to resend order? Also, is this the correct settings?

## 2023-09-12 NOTE — Research (Signed)
 Title: A Phase III, randomized, double-blind, placebo controlled, multicenter study to evaluate the efficacy and safety of astegolimab in patients with chronic obstructive pulmonary disease    Dose and Duration of Treatment: Astegolimab is presented as a sterile, slightly brown-yellow solution. Each single-use, 2.25 mL pre-filled syringe contains 1.7 mL deliverable volume. Astegolimab drug product is formulated at 140 mg/mL astegolimab with 114 mM succinic acid, 200 mM L-arginine, 10 mM L-methionine, 0.06% (w/Maria) polysorbate 20, pH 5.7. Placebo for astegolimab is supplied in an identical pre-filled syringe configuration.   Protocol # [id number]   Sponsor: [employer] [phone] [employer], [place] Protocol: Version 5 dated 08Nov2024 IB: version 9.0 dated April 2024 ICF: Main version 12Apr2023, revised 17July2023 Mobile Nursing: 01Sep2023, revised 01Sep2023 Lab Manual: V4.0.0 20Oct2023      Mechanism of action: Astegolimab (also known as [id number] or [id number]) is a fully human, IgG2 monoclonal antibody that binds with high affinity to the interleukin (IL)-33 (IL-33) receptor, ST2, thereby blocking the signaling of IL-33, an inflammatory cytokine of the IL-1 family and member of the "alarmin" class of molecules. Astegolimab binds with high affinity to the human and cynomolgus monkey receptor for IL-33, ST2, and blocks IL-33 binding, thus inhibiting association with the IL-1R accessory protein (AcP) co-receptor and formation of an activated receptor complex.   Key Inclusion Criteria: Age 70-80 years at Visit 1   Documented COPD diagnosis for >=12 months prior to Visit 1 History of frequent exacerbations, defined as having had 2 or more moderate or severe COPD exacerbations within 12 months prior to Visit 1 Post-bronchodilator FEV1 >=20% and <80% of predicted at Visit 1 or Visit 2  Post-bronchodilator FEV1/FVC <0.70 at Visit 1 or Visit 2  mMRC score >=2 at screening    Current tobacco smoker or former smoker (having stopped smoking for at least 6 months prior to Visit 1) with a history of smoking >=10 pack-years On optimized COPD maintenance therapy as defined below for >=12 months prior to Visit 1            -Inhaled corticosteroid (ICS) plus long-acting beta-agonist (LABA)           -Long-acting muscarinic antagonist (LAMA) plus LABA           -ICS plus LAMA plus LABA  Key Exclusion Criteria: Current documented diagnosis of asthma  Diagnosis of a-1 antitrypsin deficiency  History of long-term treatment with oxygen at >4.0 liters/minute  Any infection that resulted in hospital admission for >=24 hours and/or treatment with oral, IV, or IM antibiotics within 4 weeks prior to Visit 1 or during screening Upper or lower respiratory tract infection within 4 weeks prior to Visit 1 or during screening Treatment with oral, IV, or IM corticosteroids (>10 mg/day prednisolone or equivalent) within 4 weeks prior to initiation of study drug Treatment with a licensed biologic agent (e.g., omalizumab, dupilumab, and/or anti-IL-5 therapies) within 3 months or 5 drug-elimination half-lives (whichever is longer) prior to screening  Planned surgical intervention during the study Known immunodeficiency, including but not limited to, HIV infection  AST, ALT, or total bilirubin elevation >=2.0 x the upper limit of normal (ULN) during screening  History of malignancy within 5 years prior to screening, with the exception of malignancies with a negligible risk of metastasis or death (e.g., 5-year overall survival rate >90%), such as adequately treated carcinoma in situ of the cervix, non-melanoma carcinoma, localized prostate cancer, or ductal carcinoma in situ Unstable cardiac disease, myocardial infarction, or [place] Heart Association  Class III or IV heart failure within 12 months prior to screening History or absence of an abnormal ECG that is deemed clinically significant by  the investigator, including complete left bundle branch block or second- or third-degree atrioventricular heart block     Integrated Pharmacokinetic/Pharmacodynamic Analysis:  In Studies [id number], [id number], and [id number], exploratory biomarker analysis showed there were consistent decreases in blood eosinophil counts throughout the treatment period, potentially mediated by a direct effect of IL-33 on eosinophil progenitors. In [place], there was no significant difference in fractional exhaled nitric oxide (FeNO) levels (reflecting airway IL-4/IL-13 activity) between astegolimab-treated groups relative to placebo throughout the treatment period. These data suggest that astegolimab has only a limited effect on Type 2 inflammation in asthma. No data are applicable for Study [id number]. No data are available yet for Study [id number].  Special Warnings/Considerations: Administration of astegolimab, a protein therapeutic, may lead to the development of anti-astegolimab antibodies which could lead to AEs and/or decreased exposure. In non-clinical studies (see Section 4.2), ADA incidence has generally been low and there was no apparent ADA impact on PK and safety in these studies. To date, the immunogenicity rates observed with astegolimab in clinical studies have been relatively low as well (see Immunogenicity Section 5.6). Several clinical studies have been conducted in patients with asthma, atopic dermatitis, COPD (IIS Study [id number]) and COVID-19 severe pneumonia, which generally showed low incidence of ADAs (Table 31). There has been no correlation between ADA status and clinical findings or increased incidence of AEs. Astegolimab is now being considered in a larger study for the treatment of COPD. This patient population is typically considered to have a hyper-responsive immune system. Route of administration for this molecule will be [place], either Q2W or Q4W. These factors increase the risk of development of an immune  response to astegolimab, specifically with repeat dosing. From the previous IIS Study [id number], incidence of ADAs was low in COPD patients; this remains to be confirmed in a larger study. To monitor ADA development in ongoing studies, serum samples will be collected from patients at protocol-defined intervals. Patients who test positive for antibodies and have clinical sequelae that are considered potentially related to an ADA response may also be asked to return for additional follow-up testing.  Drug Interaction Studies: No PK drug interaction studies have been conducted to date.  Serious Adverse Reactions Observed in Asthma Study: During the treatment period, a similar proportion of patients across all cohorts experienced at least one AE, regardless of causality (Table 22). In total, 77.2%, 70.9%, 72.2%, and 72.1% of patients reported at least 1 AE in the placebo, 70 mg, 210 mg, and 490 mg groups, respectively. The most common AE's (>5% in any treatment group) were asthma, nasopharyngitis, upper respiratory tract infection, headache, and injection site reaction (ISR). The most common drug-related AE was ISR, which was reported more frequently in the astegolimab treatment groups than in the placebo group (1 [0.8%] patient in the placebo group, 10 [7.9%] patients with 70 mg, 8 [6.3%] patients with 210 mg, 6 [4.9%] patients with 490 mg). All ISRs were non-serious and mild or moderate in severity. During the treatment period, 50 SAEs were reported in 37 (7.4%) patients. The number of patients reporting SAEs was comparable across all cohorts (11 SAEs in 8 patients on placebo, 21 SAEs in 14 patients on 70 mg, 11 SAEs in 9 patients on 210 mg, and 7 SAEs in 6 patients on 490 mg). The most common SAE was asthma. One SAE of moderate livedo  reticularis (70 mg) was considered a suspected unexpected serious adverse drug reaction (SUSAR) related to astegolimab and was reported two days after the second dose, leading to  discontinuation of astegolimab. Two (0.4%) patients reported anaphylaxis and hypersensitivity reactions: 1 severe SAE of anaphylactic reaction (placebo), and 1 moderate hypersensitivity (490 mg) considered related to astegolimab. Three (0.6%) patients experienced a potential Major Adverse Cardiac Event (MACE) (1 patient each in the placebo, 70 mg, and 210 mg groups). None of the potential MACE were considered related to astegolimab. Overall, 233 (46.4%) patients reported events of infection. A comparable number of patients reported infection across all treatment groups (65 [51.2%] patients on placebo, 55 [43.3%] patients on 70 mg, 58 [46.0%] patients on 210 mg, and 55 [45.1%] patients on 490 mg). The most frequently reported infection (>=10% incidence) was nasopharyngitis (12.7%). One patient on astegolimab 210 mg and the partner of one patient on placebo became pregnant during the study. Both delivered normal/healthy babies. Two deaths, unrelated to study drug, were reported: one patient on 210 mg astegolimab died following an SAE of asthma; the other patient on 490 mg astegolimab had an unexplained death. There were no clinically meaningful changes in laboratory parameters, vital signs, or ECG results, other than the 10% decrease in mean blood eosinophil counts in the astegolimab-treated groups, with no safety concerns. Treatment-induced ADAs were comparable between the astegolimab groups and had no impact on safety. Overall, astegolimab was well tolerated at all doses used and had a safety profile consistent with that observed in the previous astegolimab Phase I studies.  Serious Adverse Reactions Observed in Previous COPD Study: In the completed IIS Study [id number], a total of 81 patients received at least one dose of astegolimab or placebo. The safety profile of astegolimab was similar to that of placebo. There were a total of 222 AEs reported in 62 patients. A total of 28 (72%) patients in the placebo arm  and 34 (81%) patients in the astegolimab arm reported at least one AE. The most commonly reported AEs were headache followed by urinary tract infection and viral upper respiratory tract infection. A total of 39 SAEs were reported in 28 (35%) patients; 16 (41%) patients in the placebo group and 12 (29%) in the astegolimab group. The most commonly reported SAE was hospital admission for community acquired pneumonia. Four SAEs resulted in patient discontinuation from treatment (1 patient in the placebo group and 3 patients in the astegolimab group). One patient in the placebo group experienced an AESI of potential MACE (heart failure, unrelated to the study treatment). No anaphylaxis or pregnancies were reported. Two deaths, unrelated to study treatment, were reported: 1 patient in the placebo group died after hospital acquired pneumonia, and another patient in the placebo group died after pneumonia and type 2 respiratory failure.  Safety Data: Astegolimab has been generally well tolerated. There have been 49 patient deaths across all astegolimab studies (Section 5.5.2), none of which were considered related to astegolimab. A total of 144 subjects experienced a total of 224 SAEs across all astegolimab studies. Of these, an SAE livedo reticularis observed in 1 patient was considered related to astegolimab by the investigator (Section 5.5.3). AEs leading to withdrawal (Section 5.5.4) have generally occurred at a rate similar to what would be expected for clinical trials in the studies' respective indications.   [facility] [place] Clinical Research Coordinator note :    This visit for Subject [id number] with DOB: 18 Feb 1952 on 12 Sep 2023 for the above protocol is  Visit/Encounter #week 50  and is for purpose of research.    The consent for this encounter is under Protocol Version 2.0 , Investigator Brochure Version 7, Consent Version IRB Approved  revised  and is currently IRB approved.    Subject expressed  continued interest and consent in continuing as a study subject. Subject confirmed that there was no change in contact information (e.g. address, telephone, email). Subject thanked for participation in research and contribution to science.   The Subject was informed that the PI Dr. Marchelle Gearing continues to have oversight of the subject's visits and course  through relevant discussions, reviews and also specifically of  this visit by routing of this note to the PI. [facility] [place] Clinical Research Coordinator note:   This visit for Subject Maria [name] with DOB: 18-Jan-1956 on 09/12/2023 for the above protocol is Visit/Encounter # week 50  and is for purpose of research.   Subject expressed continued interest and consent in continuing as a study subject. Subject confirmed that there was  no  change in contact information (e.g. address, telephone, email). Subject thanked for participation in research and contribution to science. The Subject was yes, informed that the PI Murali Ramaswamy,MD continues to have oversight of the subject's visits and course through relevant discussions, reviews, and also specifically of this visit by routing of this note to the PI. All procedures ans assessments were performed per above stated protocol. Subject tolerated injections well. Please refer to subjects paper source binder for further details of this visit.   Signed by  Laurelyn Sickle  Clinical Research Coordinator / Nurse [facility]  [place], [place] 10:08 AM 09/12/2023

## 2023-09-13 NOTE — Telephone Encounter (Signed)
 Please double check with the patient and if that is the right settings you can send the order

## 2023-09-15 NOTE — Telephone Encounter (Signed)
 I called and spoke with the pt and notified of response from MR  She is using 4lpm o2 with rest and 5lpm with exertion  Order was placed  Nothing further needed

## 2023-09-25 ENCOUNTER — Ambulatory Visit: Payer: Medicare HMO | Attending: Internal Medicine | Admitting: Internal Medicine

## 2023-09-25 ENCOUNTER — Encounter: Payer: Self-pay | Admitting: Internal Medicine

## 2023-09-25 VITALS — BP 165/77 | HR 60 | Resp 20 | Ht 67.0 in | Wt 158.0 lb

## 2023-09-25 DIAGNOSIS — S76012D Strain of muscle, fascia and tendon of left hip, subsequent encounter: Secondary | ICD-10-CM | POA: Diagnosis not present

## 2023-09-25 DIAGNOSIS — R768 Other specified abnormal immunological findings in serum: Secondary | ICD-10-CM

## 2023-09-25 MED ORDER — CYCLOBENZAPRINE HCL 5 MG PO TABS: 5.0000 mg | ORAL_TABLET | Freq: Every evening | ORAL | 0 refills | Status: DC | PRN

## 2023-09-25 NOTE — Progress Notes (Signed)
 Office Visit Note  Patient: Maria [name]             Date of Birth: [birth year]           MRN: [id number]             PCP: Eden Emms, NP Referring: Eden Emms, NP Visit Date: 09/25/2023   Subjective:  New Patient (Initial Visit) (Patient states she has pain in her neck down to her right shoulder down to her right fingers. )    Discussed the use of AI scribe software for clinical note transcription with the patient, who gave verbal consent to proceed.  History of Present Illness   Maria [name] is a 68 y.o. female here for evaluation of positive RNP antibodies and elevated sed rate association with leg rashes and joint pain.   She experiences ongoing joint pain, particularly in her right hip and shoulder. The hip pain worsens with walking, and she describes a sensation of the bone 'going up in my hip'. She has had several falls, including one that resulted in a broken foot about a year ago. She uses a cane for support due to pain and balance issues and sometimes uses a walker for better balance. The hip pain can wake her at night, especially if she turns over onto it. She has received injections in her hip in the past, which provided temporary relief. No current numbness in her legs, rashes on her arms, torso, or face, and any color changes in her fingers unless they get very cold. No ulcers in her mouth or lymph node swelling.  She experienced rashes on her legs last fall, occurring twice, extending from her toes up to her waist, accompanied by numbness in her legs. Treatment with an antibiotic for three to four days helped clear the rash, although it took some time to resolve completely. She has not noticed any rashes elsewhere on her body.  She mentions having abnormal findings on lung imaging, including a nodule, but is unsure of the cause.  She previously worked as a Emergency planning/management officer at [employer] but was unable to keep up with the demands of the job.      Labs reviewed ANA pos RNP 2.2  Activities of Daily Living:  Patient reports morning stiffness for 1-2 hours.   Patient Reports nocturnal pain.  Difficulty dressing/grooming: Denies Difficulty climbing stairs: Reports Difficulty getting out of chair: Denies Difficulty using hands for taps, buttons, cutlery, and/or writing: Reports  Review of Systems  Constitutional:  Positive for fatigue.  HENT:  Positive for mouth dryness. Negative for mouth sores.   Eyes:  Positive for itching. Negative for dryness.  Respiratory:  Positive for shortness of breath.   Cardiovascular:  Negative for chest pain and palpitations.  Gastrointestinal:  Negative for blood in stool, constipation and diarrhea.  Endocrine: Negative for increased urination.  Genitourinary:  Positive for involuntary urination.  Musculoskeletal:  Positive for joint pain, gait problem, joint pain, morning stiffness and muscle tenderness. Negative for joint swelling, myalgias, muscle weakness and myalgias.  Skin:  Positive for hair loss and sensitivity to sunlight. Negative for color change and rash.  Allergic/Immunologic: Negative for susceptible to infections.  Neurological:  Negative for dizziness and headaches.  Hematological:  Negative for swollen glands.  Psychiatric/Behavioral:  Positive for depressed mood and sleep disturbance. The patient is nervous/anxious.     PMFS History:  Patient Active Problem List   Diagnosis Date Noted   Abscess  of breast 06/06/2023   Transient hypotension 06/06/2023   Lung nodule 05/22/2023   COPD exacerbation (HCC) 05/10/2023   Dyslipidemia 05/10/2023   Pulmonary hypertension (HCC) 05/10/2023   GERD without esophagitis 05/10/2023   PVD (peripheral vascular disease) (HCC) 05/10/2023   Depression 05/10/2023   Peripheral neuropathy 05/10/2023   Acute cough 04/18/2023   Acute intractable tension-type headache 04/18/2023   Positive ANA (antinuclear antibody) 04/18/2023   Viral URI  04/05/2023   Purpura (HCC) 04/05/2023   Carotid stenosis, symptomatic w/o infarct, left 03/07/2023   Research study patient 01/03/2023   Left foot pain 11/30/2022   Acute left ankle pain 11/30/2022   Fall 09/01/2022   Sleep disturbance 09/01/2022   Closed displaced fracture of fifth metatarsal bone of left foot 08/09/2022   Generalized body aches 06/01/2022   Cervical fusion syndrome (C6-7) 04/07/2022   Cervical disc disorder with radiculopathy of cervical region (right) 04/07/2022   Requires continuous at home supplemental oxygen (3L)  COPD 04/07/2022   Chronic pain syndrome 04/07/2022   Acute on chronic respiratory failure with hypoxemia (HCC) 02/27/2022   Atherosclerosis of native arteries of extremity with intermittent claudication (HCC) 01/28/2022   Preventative health care 12/15/2021   COPD with acute exacerbation (HCC) 10/25/2021   Current mild episode of major depressive disorder without prior episode (HCC) 10/01/2021   Chronic right shoulder pain 08/09/2021   History of cervical spinal surgery 08/09/2021   Radicular pain in right arm 08/09/2021   Numbness and tingling in both hands 06/17/2021   Carotid stenosis 06/15/2021   Hyperlipidemia 06/15/2021   Right foot pain 05/11/2021   Stress incontinence 03/15/2021   Urinary frequency 03/15/2021   Pulsatile tinnitus of left ear 03/15/2021   Chronic cough 12/11/2020   Prediabetes 09/30/2019   Tear of left gluteus minimus tendon 09/30/2019   Aortic atherosclerosis (HCC) 09/30/2019   Emphysema due to alpha-1-antitrypsin deficiency (HCC) 09/16/2019   Cheilitis 08/27/2019   Other microscopic hematuria 08/27/2019   Bronchiectasis without complication (HCC) 04/22/2019   Chronic respiratory failure with hypoxia (HCC) 04/09/2019   Pneumonia due to COVID-19 virus 02/27/2019   COVID-19 virus infection 02/26/2019   Stage 2 moderate COPD by GOLD classification (HCC) 02/26/2019   Syncope 02/26/2019   Orthostatic hypotension 02/26/2019    Left buttock pain 04/12/2018   Anxiety 01/30/2017   Chronic bronchitis (HCC) 07/14/2015   Smoking history 04/15/2015   Benign neoplasm of parathyroid gland 06/04/2012   Enthesopathy of ankle and tarsus 06/04/2012   History of DVT (deep vein thrombosis) 05/21/2010    Past Medical History:  Diagnosis Date   Atherosclerosis 1/0612014   aorta, iliacs and CFA bilaterally no greater than 0-49% - lower arterial doppler.   Blood transfusion without reported diagnosis 2005   Cancer [facility]) Ovarian 1978   Chest pain    COPD (chronic obstructive pulmonary disease) (HCC)    COVID-19 long hauler manifesting chronic dyspnea    Emphysema of lung (HCC) 2019   Oxygen deficiency 03/2019   Oxygen dependent    3 liters up to 5    Family History  Problem Relation Age of Onset   Heart failure Mother    Diabetes Mother    Kidney disease Mother    Hypertension Mother    Arthritis Mother    Heart disease Mother    Obesity Mother    COPD Father    Hearing loss Father    Lung cancer Brother    COPD Brother    COPD Brother    Asthma  Son    Diabetes Son    Birth defects Son    Breast cancer Maternal Aunt 40   Past Surgical History:  Procedure Laterality Date   ABDOMINAL HYSTERECTOMY  1978   BREAST BIOPSY Left 04/04/2023   Stereo bx, X clip, path pending   BREAST BIOPSY Left 04/04/2023   MM LT BREAST BX W LOC DEV 1ST LESION IMAGE BX SPEC STEREO GUIDE 04/04/2023 [facility]   CAROTID PTA/STENT INTERVENTION Left 03/07/2023   Procedure: CAROTID PTA/STENT INTERVENTION;  Surgeon: Renford Dills, MD;  Location: [facility] INVASIVE CV LAB;  Service: Cardiovascular;  Laterality: Left;   CHOLECYSTECTOMY  2010   COLONOSCOPY     COLONOSCOPY     COLONOSCOPY WITH PROPOFOL N/A 11/01/2021   Procedure: COLONOSCOPY WITH PROPOFOL;  Surgeon: Wyline Mood, MD;  Location: 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      Family: Daughter Recruitment consultant nearby, son Thereasa Distance in [place], and other daughter - 1 hour; 5 grandchildren      Enjoys: crochet, relax at home      Exercise: on hold but has been doing pulmonary rehab   Diet: not currently, but has lost weight      Safety   Seat belts: Yes    Guns: Yes  and secure   Safe in relationships: Yes    Immunization History  Administered Date(s) Administered   Fluad Quad(high Dose 65+) 03/30/2022   Influenza, High Dose Seasonal PF 03/30/2022, 03/27/2023   Influenza, Seasonal, Injecte, Preservative Fre 04/28/2008, 04/09/2010   Influenza,inj,Quad PF,6+ Mos 04/22/2019, 04/25/2020, 03/15/2021   Influenza-Unspecified 06/01/2008, 05/10/2016, 03/18/2017   Moderna Sars-Covid-2 Vaccination 07/30/2019, 08/28/2019, 06/27/2020   PNEUMOCOCCAL CONJUGATE-20 07/06/2022   Pfizer Covid-19 Vaccine Bivalent Booster 33yrs & up 07/28/2021   Pneumococcal Polysaccharide-23 04/15/2015, 04/22/2020   RSV,unspecified 07/06/2022   Respiratory Syncytial Virus Vaccine,Recomb Aduvanted(Arexvy) 05/30/2022   Td 10/01/2020   Tdap 11/17/2009, 03/27/2023   Zoster Recombinant(Shingrix) 04/16/2020, 06/22/2020     Objective: Vital Signs: BP (!) 165/77 (BP Location: Left Arm, Patient Position: Sitting, Cuff Size: Normal) Comment (BP Location): per patient, can only take in left arm  Pulse 60   Resp 20   Ht 5\' 7"  (1.702 m)   Wt 158 lb (71.7 kg)   BMI 24.75 kg/m    Physical Exam Eyes:     Conjunctiva/sclera: Conjunctivae normal.  Cardiovascular:     Rate and Rhythm: Normal rate and regular rhythm.  Pulmonary:     Effort: Pulmonary effort is normal.     Breath sounds: Normal breath sounds.  Musculoskeletal:     Right lower leg: No edema.     Left lower leg:  No edema.  Lymphadenopathy:     Cervical: No cervical adenopathy.  Skin:    General: Skin is warm and dry.     Comments: Superficial tortuous superficial veins in legs and feet No pitting edema No overlying dermatitis  Neurological:     Mental Status: She is alert.  Psychiatric:        Mood and Affect: Mood normal.      Musculoskeletal Exam:  Elbows full ROM no tenderness  or swelling Wrists full ROM no tenderness or swelling Fingers full ROM no tenderness or swelling Left lateral hip pain with palpation, pain provoked with internal and external rotation Knees full ROM no tenderness or swelling   Investigation: No additional findings.  Imaging: No results found.  Recent Labs: Lab Results  Component Value Date   WBC 15.2 (H) 05/12/2023   HGB 10.6 (L) 05/12/2023   PLT 256 05/12/2023   NA 135 05/12/2023   K 3.9 05/12/2023   CL 101 05/12/2023   CO2 24 05/12/2023   GLUCOSE 95 05/12/2023   BUN 17 05/12/2023   CREATININE 0.63 05/12/2023   BILITOT 0.7 05/12/2023   ALKPHOS 45 05/12/2023   AST 15 05/12/2023   ALT 20 05/12/2023   PROT 5.9 (L) 05/12/2023   ALBUMIN 3.1 (L) 05/12/2023   CALCIUM 9.0 05/12/2023   GFRAA >60 03/03/2019   QFTBGOLDPLUS NEGATIVE 03/31/2022    Speciality Comments: No specialty comments available.   Allergies: Montelukast   Assessment / Plan:     Visit Diagnoses: Positive ANA (antinuclear antibody) - Plan: RNP Antibody, C3 and C4, Sedimentation rate, C-reactive protein Previous abnormal lab results with weakly positive RNP antibodies and inflammatory markers. No current signs of systemic inflammation or vasculitis on examination. -Repeat sedimentation rate, C-reactive protein, and RNP antibodies to assess for changes since last fall. -Plan to call patient with results and determine need for follow-up based on these findings.  Tear of left gluteus minimus tendon, subsequent encounter - Plan: cyclobenzaprine (FLEXERIL) 5 MG tablet Hip  Pain Chronic hip pain, likely secondary to bursitis or tendonitis. Pain is worse with movement and at night, disrupting sleep. Previous trials of physical therapy and injections provided temporary relief. -Consider trial of muscle relaxant such as Flexeril 5 mg at night to help with sleep disruption, monitor for drowsiness or instability side effects. -Provide information on hip exercises to be done at home.  Recurrent Rashes Previous episodes of rashes on legs, resolved with antibiotic treatment. No current rash.  Falls/Balance Issues History of falls, difficulty with balance, and reliance on cane for support.  Lung Nodules Previous abnormal lung imaging, possibly related to past illness. -No active intervention discussed in this conversation.    Orders: Orders Placed This Encounter  Procedures   RNP Antibody   C3 and C4   Sedimentation rate   C-reactive protein   Meds ordered this encounter  Medications   cyclobenzaprine (FLEXERIL) 5 MG tablet    Sig: Take 1 tablet (5 mg total) by mouth at bedtime as needed for muscle spasms.    Dispense:  30 tablet    Refill:  0     Follow-Up Instructions: No follow-ups on file.   Fuller Plan, MD  Note - This record has been created using [employer].  Chart creation errors have been sought, but may not always  have been located. Such creation errors do not reflect on  the standard of medical care.

## 2023-09-26 LAB — RNP ANTIBODY: Ribonucleic Protein(ENA) Antibody, IgG: 3.1 AI — AB

## 2023-09-26 LAB — C3 AND C4
C3 Complement: 137 mg/dL (ref 83–193)
C4 Complement: 19 mg/dL (ref 15–57)

## 2023-09-26 LAB — C-REACTIVE PROTEIN: CRP: <3 mg/L (ref <8.0)

## 2023-09-26 LAB — SEDIMENTATION RATE: Sed Rate: 9 mm/h (ref 0–30)

## 2023-09-27 ENCOUNTER — Encounter

## 2023-09-27 ENCOUNTER — Other Ambulatory Visit (HOSPITAL_COMMUNITY): Payer: Self-pay

## 2023-09-27 ENCOUNTER — Telehealth: Payer: Self-pay | Admitting: Internal Medicine

## 2023-09-27 ENCOUNTER — Telehealth: Payer: Self-pay

## 2023-09-27 NOTE — Telephone Encounter (Signed)
 Pharmacy Patient Advocate Encounter   Received notification from [facility] that Prior Authorization for Roflumilast tablets  has been APPROVED from 09/27/2023 to 07/17/2024. Ran test claim, Copay is $46.83. This test claim was processed through [facility]- copay amounts may vary at other pharmacies due to pharmacy/plan contracts, or as the patient moves through the different stages of their insurance plan.        I spoke with the pt and notified of medication approval  Nothing further needed

## 2023-09-27 NOTE — Telephone Encounter (Signed)
*  Pulm  Pharmacy Patient Advocate Encounter   Received notification from CoverMyMeds that prior authorization for Roflumilast tablets  is required/requested.   Insurance verification completed.   The patient is insured through [facility] .   Per test claim: PA required; PA submitted to above mentioned insurance via CoverMyMeds Key/confirmation #/EOC [id number] Status is pending

## 2023-09-27 NOTE — Telephone Encounter (Signed)
Patient is returning phone call.  Patient phone number is [phone].

## 2023-09-27 NOTE — Telephone Encounter (Signed)
 Pharmacy Patient Advocate Encounter  Received notification from [facility] that Prior Authorization for Roflumilast tablets  has been APPROVED from 09/27/2023 to 07/17/2024. Ran test claim, Copay is $46.83. This test claim was processed through [facility]- copay amounts may vary at other pharmacies due to pharmacy/plan contracts, or as the patient moves through the different stages of their insurance plan.

## 2023-09-27 NOTE — Telephone Encounter (Signed)
 ATC x 1 left a message on patient's phone to call our office back.

## 2023-09-27 NOTE — Progress Notes (Signed)
 The RNP antibody test remains mildly positive at 3.1.  However the previously high sedimentation rate is now normal and the CRP and complement test for inflammation are also normal. I do not think we need any follow up appointment unless for new symptoms.

## 2023-09-28 ENCOUNTER — Other Ambulatory Visit: Payer: Self-pay

## 2023-09-28 ENCOUNTER — Telehealth: Payer: Self-pay

## 2023-09-28 MED ORDER — ROFLUMILAST 500 MCG PO TABS
500.0000 ug | ORAL_TABLET | Freq: Every day | ORAL | 11 refills | Status: DC
  Filled 2023-09-28: qty 30, 30d supply, fill #0
  Filled 2023-11-05 – 2023-12-19 (×2): qty 30, 30d supply, fill #1

## 2023-09-28 NOTE — Telephone Encounter (Signed)
 Roflumilast rx sent to [facility] per [employer].

## 2023-09-29 ENCOUNTER — Other Ambulatory Visit: Payer: Self-pay

## 2023-10-01 DIAGNOSIS — M25511 Pain in right shoulder: Secondary | ICD-10-CM | POA: Diagnosis not present

## 2023-10-01 DIAGNOSIS — M5010 Cervical disc disorder with radiculopathy, unspecified cervical region: Secondary | ICD-10-CM | POA: Diagnosis not present

## 2023-10-01 DIAGNOSIS — S92352S Displaced fracture of fifth metatarsal bone, left foot, sequela: Secondary | ICD-10-CM | POA: Diagnosis not present

## 2023-10-01 DIAGNOSIS — G894 Chronic pain syndrome: Secondary | ICD-10-CM | POA: Diagnosis not present

## 2023-10-02 ENCOUNTER — Ambulatory Visit: Payer: Medicare HMO | Admitting: Nurse Practitioner

## 2023-10-03 ENCOUNTER — Ambulatory Visit (INDEPENDENT_AMBULATORY_CARE_PROVIDER_SITE_OTHER): Payer: Medicare HMO | Admitting: Nurse Practitioner

## 2023-10-03 ENCOUNTER — Other Ambulatory Visit: Payer: Self-pay | Admitting: Nurse Practitioner

## 2023-10-03 VITALS — BP 126/64 | HR 65 | Temp 97.7°F | Ht 67.0 in | Wt 159.6 lb

## 2023-10-03 DIAGNOSIS — I959 Hypotension, unspecified: Secondary | ICD-10-CM

## 2023-10-03 DIAGNOSIS — G479 Sleep disorder, unspecified: Secondary | ICD-10-CM

## 2023-10-03 NOTE — Patient Instructions (Signed)
 Nice to see you today Blood pressure looks good I want to see you in 5 months for your physical and labs, sooner if you need me

## 2023-10-03 NOTE — Progress Notes (Signed)
   Established Patient Office Visit  Subjective   Patient ID: Maria [name], female    DOB: Apr 22, 1956  Age: 68 y.o. MRN: [id number]  Chief Complaint  Patient presents with   Follow-up    BP recheck . Pts states BP is good, usually runs 130/65 or 120/65.     HPI  Transient hypotension: Patient was seen by me on 06/06/2023.  Patient ended in the hospital at some point and was placed on midodrine after she had failed intervention and stenting.  We started weaning patient down on midodrine slowly.  Patient was seen back in office on 07/04/2023 after she had been weaned off of midodrine patient's blood pressure was within normal limits no increased lightheadedness or dizziness.  Additional recheck to make sure blood pressure has been maintaining  States that she is checking her blood pressure at home. But states that the blood pressure cuff has broken. She plans to get a new cuff.    Review of Systems  Constitutional:  Negative for chills and fever.  Respiratory:  Positive for shortness of breath (baseline).   Cardiovascular:  Negative for chest pain.  Neurological:  Negative for dizziness and headaches.      Objective:     BP 126/64   Pulse 65   Temp 97.7 F (36.5 C) (Oral)   Ht 5\' 7"  (1.702 m)   Wt 159 lb 9.6 oz (72.4 kg)   SpO2 97%   BMI 25.00 kg/m    Physical Exam Vitals and nursing note reviewed.  Constitutional:      Appearance: Normal appearance.  Cardiovascular:     Rate and Rhythm: Normal rate and regular rhythm.     Heart sounds: Normal heart sounds.  Pulmonary:     Effort: Pulmonary effort is normal.     Comments: Greatly decreased globally Neurological:     Mental Status: She is alert.      No results found for any visits on 10/03/23.    The 10-year ASCVD risk score (Arnett DK, et al., 2019) is: 5.1%    Assessment & Plan:   Problem List Items Addressed This Visit       Cardiovascular and Mediastinum   Transient hypotension - Primary    Patient not on any medications to aid in blood pressure support.  She was checking blood pressure intermittently at this point with a normal limits.  Patient having lightheadedness or dizziness.  Stable at this juncture       Return in about 5 months (around 03/04/2024) for CPE and Labs.    Audria Nine, NP

## 2023-10-03 NOTE — Assessment & Plan Note (Signed)
 Patient not on any medications to aid in blood pressure support.  She was checking blood pressure intermittently at this point with a normal limits.  Patient having lightheadedness or dizziness.  Stable at this juncture

## 2023-10-12 ENCOUNTER — Encounter: Admitting: *Deleted

## 2023-10-12 DIAGNOSIS — J441 Chronic obstructive pulmonary disease with (acute) exacerbation: Secondary | ICD-10-CM

## 2023-10-12 NOTE — Research (Signed)
 Title: A Phase III, randomized, double-blind, placebo controlled, multicenter study to evaluate the efficacy and safety of astegolimab in patients with chronic obstructive pulmonary disease    Dose and Duration of Treatment: Astegolimab is presented as a sterile, slightly brown-yellow solution. Each single-use, 2.25 mL pre-filled syringe contains 1.7 mL deliverable volume. Astegolimab drug product is formulated at 140 mg/mL astegolimab with 114 mM succinic acid, 200 mM L-arginine, 10 mM L-methionine, 0.06% (w/v) polysorbate 20, pH 5.7. Placebo for astegolimab is supplied in an identical pre-filled syringe configuration.   Protocol # [id number] Protocol: Version 5 dated 08Nov2024 IB: version 9.0 dated April 2024 ICF: Main version 12Apr2023, revised 17July2023 Mobile Nursing: 01Sep2023, revised 01Sep2023 Lab Manual: V4.0.0 20Oct2023      Mechanism of action: Astegolimab (also known as [id number] or [id number]) is a fully human, IgG2 monoclonal antibody that binds with high affinity to the interleukin (IL)-33 (IL-33) receptor, ST2, thereby blocking the signaling of IL-33, an inflammatory cytokine of the IL-1 family and member of the "alarmin" class of molecules. Astegolimab binds with high affinity to the human and cynomolgus monkey receptor for IL-33, ST2, and blocks IL-33 binding, thus inhibiting association with the IL-1R accessory protein (AcP) co-receptor and formation of an activated receptor complex.   Key Inclusion Criteria: Age 43-80 years at Visit 1   Documented COPD diagnosis for >=12 months prior to Visit 1 History of frequent exacerbations, defined as having had 2 or more moderate or severe COPD exacerbations within 12 months prior to Visit 1 Post-bronchodilator FEV1 >=20% and <80% of predicted at Visit 1 or Visit 2  Post-bronchodilator FEV1/FVC <0.70 at Visit 1 or Visit 2  mMRC score >=2 at screening   Current tobacco smoker or former smoker (having stopped smoking for at least 6 months  prior to Visit 1) with a history of smoking >=10 pack-years On optimized COPD maintenance therapy as defined below for >=12 months prior to Visit 1            -Inhaled corticosteroid (ICS) plus long-acting beta-agonist (LABA)           -Long-acting muscarinic antagonist (LAMA) plus LABA           -ICS plus LAMA plus LABA  Key Exclusion Criteria: Current documented diagnosis of asthma  Diagnosis of a-1 antitrypsin deficiency  History of long-term treatment with oxygen at >4.0 liters/minute  Any infection that resulted in hospital admission for >=24 hours and/or treatment with oral, IV, or IM antibiotics within 4 weeks prior to Visit 1 or during screening Upper or lower respiratory tract infection within 4 weeks prior to Visit 1 or during screening Treatment with oral, IV, or IM corticosteroids (>10 mg/day prednisolone or equivalent) within 4 weeks prior to initiation of study drug Treatment with a licensed biologic agent (e.g., omalizumab, dupilumab, and/or anti-IL-5 therapies) within 3 months or 5 drug-elimination half-lives (whichever is longer) prior to screening  Planned surgical intervention during the study Known immunodeficiency, including but not limited to, HIV infection  AST, ALT, or total bilirubin elevation >=2.0 x the upper limit of normal (ULN) during screening  History of malignancy within 5 years prior to screening, with the exception of malignancies with a negligible risk of metastasis or death (e.g., 5-year overall survival rate >90%), such as adequately treated carcinoma in situ of the cervix, non-melanoma carcinoma, localized prostate cancer, or ductal carcinoma in situ Unstable cardiac disease, myocardial infarction, or New York Heart Association Class III or IV heart failure within 12 months prior to screening History  or absence of an abnormal ECG that is deemed clinically significant by the investigator, including complete left bundle branch block or second- or third-degree  atrioventricular heart block     Integrated Pharmacokinetic/Pharmacodynamic Analysis:  In Studies [id number], [id number], and [id number], exploratory biomarker analysis showed there were consistent decreases in blood eosinophil counts throughout the treatment period, potentially mediated by a direct effect of IL-33 on eosinophil progenitors. In [place], there was no significant difference in fractional exhaled nitric oxide (FeNO) levels (reflecting airway IL-4/IL-13 activity) between astegolimab-treated groups relative to placebo throughout the treatment period. These data suggest that astegolimab has only a limited effect on Type 2 inflammation in asthma. No data are applicable for Study [id number]. No data are available yet for Study [id number].  Special Warnings/Considerations: Administration of astegolimab, a protein therapeutic, may lead to the development of anti-astegolimab antibodies which could lead to AEs and/or decreased exposure. In non-clinical studies (see Section 4.2), ADA incidence has generally been low and there was no apparent ADA impact on PK and safety in these studies. To date, the immunogenicity rates observed with astegolimab in clinical studies have been relatively low as well (see Immunogenicity Section 5.6). Several clinical studies have been conducted in patients with asthma, atopic dermatitis, COPD (IIS Study [id number]) and COVID-19 severe pneumonia, which generally showed low incidence of ADAs (Table 31). There has been no correlation between ADA status and clinical findings or increased incidence of AEs. Astegolimab is now being considered in a larger study for the treatment of COPD. This patient population is typically considered to have a hyper-responsive immune system. Route of administration for this molecule will be [place], either Q2W or Q4W. These factors increase the risk of development of an immune response to astegolimab, specifically with repeat dosing. From the previous IIS Study  [id number], incidence of ADAs was low in COPD patients; this remains to be confirmed in a larger study. To monitor ADA development in ongoing studies, serum samples will be collected from patients at protocol-defined intervals. Patients who test positive for antibodies and have clinical sequelae that are considered potentially related to an ADA response may also be asked to return for additional follow-up testing.  Drug Interaction Studies: No PK drug interaction studies have been conducted to date.  Serious Adverse Reactions Observed in Asthma Study: During the treatment period, a similar proportion of patients across all cohorts experienced at least one AE, regardless of causality (Table 22). In total, 77.2%, 70.9%, 72.2%, and 72.1% of patients reported at least 1 AE in the placebo, 70 mg, 210 mg, and 490 mg groups, respectively. The most common AE's (>5% in any treatment group) were asthma, nasopharyngitis, upper respiratory tract infection, headache, and injection site reaction (ISR). The most common drug-related AE was ISR, which was reported more frequently in the astegolimab treatment groups than in the placebo group (1 [0.8%] patient in the placebo group, 10 [7.9%] patients with 70 mg, 8 [6.3%] patients with 210 mg, 6 [4.9%] patients with 490 mg). All ISRs were non-serious and mild or moderate in severity. During the treatment period, 50 SAEs were reported in 37 (7.4%) patients. The number of patients reporting SAEs was comparable across all cohorts (11 SAEs in 8 patients on placebo, 21 SAEs in 14 patients on 70 mg, 11 SAEs in 9 patients on 210 mg, and 7 SAEs in 6 patients on 490 mg). The most common SAE was asthma. One SAE of moderate livedo reticularis (70 mg) was considered a suspected unexpected serious adverse drug reaction (SUSAR)  related to astegolimab and was reported two days after the second dose, leading to discontinuation of astegolimab. Two (0.4%) patients reported anaphylaxis and  hypersensitivity reactions: 1 severe SAE of anaphylactic reaction (placebo), and 1 moderate hypersensitivity (490 mg) considered related to astegolimab. Three (0.6%) patients experienced a potential Major Adverse Cardiac Event (MACE) (1 patient each in the placebo, 70 mg, and 210 mg groups). None of the potential MACE were considered related to astegolimab. Overall, 233 (46.4%) patients reported events of infection. A comparable number of patients reported infection across all treatment groups (65 [51.2%] patients on placebo, 55 [43.3%] patients on 70 mg, 58 [46.0%] patients on 210 mg, and 55 [45.1%] patients on 490 mg). The most frequently reported infection (>=10% incidence) was nasopharyngitis (12.7%). One patient on astegolimab 210 mg and the partner of one patient on placebo became pregnant during the study. Both delivered normal/healthy babies. Two deaths, unrelated to study drug, were reported: one patient on 210 mg astegolimab died following an SAE of asthma; the other patient on 490 mg astegolimab had an unexplained death. There were no clinically meaningful changes in laboratory parameters, vital signs, or ECG results, other than the 10% decrease in mean blood eosinophil counts in the astegolimab-treated groups, with no safety concerns. Treatment-induced ADAs were comparable between the astegolimab groups and had no impact on safety. Overall, astegolimab was well tolerated at all doses used and had a safety profile consistent with that observed in the previous astegolimab Phase I studies.  Serious Adverse Reactions Observed in Previous COPD Study: In the completed IIS Study [id number], a total of 81 patients received at least one dose of astegolimab or placebo. The safety profile of astegolimab was similar to that of placebo. There were a total of 222 AEs reported in 62 patients. A total of 28 (72%) patients in the placebo arm and 34 (81%) patients in the astegolimab arm reported at least one AE. The most  commonly reported AEs were headache followed by urinary tract infection and viral upper respiratory tract infection. A total of 39 SAEs were reported in 28 (35%) patients; 16 (41%) patients in the placebo group and 12 (29%) in the astegolimab group. The most commonly reported SAE was hospital admission for community acquired pneumonia. Four SAEs resulted in patient discontinuation from treatment (1 patient in the placebo group and 3 patients in the astegolimab group). One patient in the placebo group experienced an AESI of potential MACE (heart failure, unrelated to the study treatment). No anaphylaxis or pregnancies were reported. Two deaths, unrelated to study treatment, were reported: 1 patient in the placebo group died after hospital acquired pneumonia, and another patient in the placebo group died after pneumonia and type 2 respiratory failure.  Safety Data: Astegolimab has been generally well tolerated. There have been 49 patient deaths across all astegolimab studies (Section 5.5.2), none of which were considered related to astegolimab. A total of 144 subjects experienced a total of 224 SAEs across all astegolimab studies. Of these, an SAE livedo reticularis observed in 1 patient was considered related to astegolimab by the investigator (Section 5.5.3). AEs leading to withdrawal (Section 5.5.4) have generally occurred at a rate similar to what would be expected for clinical trials in the studies' respective indications.   [facility] @  Clinical Research Coordinator note :    This visit for Subject [id number] with DOB: 04/28/1956 on 12 Oct 2023 for the above protocol is Visit/Encounter #  week 52and is for purpose of research.  Subject expressed continued interest and consent in continuing as a study subject. Subject confirmed that there was no change in contact information (e.g. address, telephone, email). Subject thanked for participation in research and contribution to science.  In this visit  week 52 the subject will be evaluated by Erven Colla research coordinator has verified that the above investigator is up to date with his/her training logs.    The Subject was informed that the PI Dr. Marchelle Gearing continues to have oversight of the subject's visits and course  through relevant discussions, reviews and also specifically of this visit by routing of this note to the PI.    This visit is a key visit of  screening. The PI is available for this visit. In addition, ahead of the key visit of screening, the visit and subject were discussed with the PI Dr. Marchelle Gearing on date of 05 Oct 2023 face to face as part of direct PI oversight.    Signed by Laurelyn Sickle Clinical Research Coordinator  [facility]  [place], [place]

## 2023-10-17 ENCOUNTER — Encounter: Admitting: *Deleted

## 2023-10-17 DIAGNOSIS — J441 Chronic obstructive pulmonary disease with (acute) exacerbation: Secondary | ICD-10-CM

## 2023-10-17 NOTE — Research (Signed)
 Title: A Phase III, randomized, double-blind, placebo controlled, multicenter study to evaluate the efficacy and safety of astegolimab in patients with chronic obstructive pulmonary disease    Dose and Duration of Treatment: Astegolimab is presented as a sterile, slightly brown-yellow solution. Each single-use, 2.25 mL pre-filled syringe contains 1.7 mL deliverable volume. Astegolimab drug product is formulated at 140 mg/mL astegolimab with 114 mM succinic acid, 200 mM L-arginine, 10 mM L-methionine, 0.06% (w/v) polysorbate 20, pH 5.7. Placebo for astegolimab is supplied in an identical pre-filled syringe configuration.   Protocol # [id number] Protocol Version: v3 dated 15Oct2024 IB: version 9.0 dated April 2024 ICF:  ICFs: Make sure that the subject is re-consented with the updated ICFs Main ICF: [facility] Approved Version 15 Jun 2023, Revised 28Nov2024 Mobile Nursing: [facility] Approved Version 26Oct2023, Revised 08Aug2024 Optional Home Study Treatment: IRB Approved Version 12 May 2022 Revised 23 Feb 2023 ADDENDUM 1: IRB Approved Version 24Jul2024 Revised 8Aug2024 Lab Manual: V2.1.0 dated 21Jun2024    Mechanism of action: Astegolimab (also known as [id number] or [id number]) is a fully human, IgG2 monoclonal antibody that binds with high affinity to the interleukin (IL)-33 (IL-33) receptor, ST2, thereby blocking the signaling of IL-33, an inflammatory cytokine of the IL-1 family and member of the "alarmin" class of molecules. Astegolimab binds with high affinity to the human and cynomolgus monkey receptor for IL-33, ST2, and blocks IL-33 binding, thus inhibiting association with the IL-1R accessory protein (AcP) co-receptor and formation of an activated receptor complex.   Key Inclusion Criteria: Age 49-80 years at Visit 1   Documented COPD diagnosis for >=12 months prior to Visit 1 History of frequent exacerbations, defined as having had 2 or more moderate or severe COPD exacerbations within 12  months prior to Visit 1 Post-bronchodilator FEV1 >=20% and <80% of predicted at Visit 1 or Visit 2  Post-bronchodilator FEV1/FVC <0.70 at Visit 1 or Visit 2  mMRC score >=2 at screening   Current tobacco smoker or former smoker (having stopped smoking for at least 6 months prior to Visit 1) with a history of smoking >=10 pack-years On optimized COPD maintenance therapy as defined below for >=12 months prior to Visit 1            -Inhaled corticosteroid (ICS) plus long-acting beta-agonist (LABA)           -Long-acting muscarinic antagonist (LAMA) plus LABA           -ICS plus LAMA plus LABA  Key Exclusion Criteria: Current documented diagnosis of asthma  Diagnosis of a-1 antitrypsin deficiency  History of long-term treatment with oxygen at >4.0 liters/minute  Any infection that resulted in hospital admission for >=24 hours and/or treatment with oral, IV, or IM antibiotics within 4 weeks prior to Visit 1 or during screening Upper or lower respiratory tract infection within 4 weeks prior to Visit 1 or during screening Treatment with oral, IV, or IM corticosteroids (>10 mg/day prednisolone or equivalent) within 4 weeks prior to initiation of study drug Treatment with a licensed biologic agent (e.g., omalizumab, dupilumab, and/or anti-IL-5 therapies) within 3 months or 5 drug-elimination half-lives (whichever is longer) prior to screening  Planned surgical intervention during the study Known immunodeficiency, including but not limited to, HIV infection  AST, ALT, or total bilirubin elevation >=2.0 x the upper limit of normal (ULN) during screening  History of malignancy within 5 years prior to screening, with the exception of malignancies with a negligible risk of metastasis or death (e.g., 5-year overall survival rate >  90%), such as adequately treated carcinoma in situ of the cervix, non-melanoma carcinoma, localized prostate cancer, or ductal carcinoma in situ Unstable cardiac disease, myocardial  infarction, or New York Heart Association Class III or IV heart failure within 12 months prior to screening History or absence of an abnormal ECG that is deemed clinically significant by the investigator, including complete left bundle branch block or second- or third-degree atrioventricular heart block     Integrated Pharmacokinetic/Pharmacodynamic Analysis:  In Studies ZO10960, [id number], and [id number], exploratory biomarker analysis showed there were consistent decreases in blood eosinophil counts throughout the treatment period, potentially mediated by a direct effect of IL-33 on eosinophil progenitors. In [place], there was no significant difference in fractional exhaled nitric oxide (FeNO) levels (reflecting airway IL-4/IL-13 activity) between astegolimab-treated groups relative to placebo throughout the treatment period. These data suggest that astegolimab has only a limited effect on Type 2 inflammation in asthma. No data are applicable for Study [id number]. No data are available yet for Study [id number].  Special Warnings/Considerations: Administration of astegolimab, a protein therapeutic, may lead to the development of anti-astegolimab antibodies which could lead to AEs and/or decreased exposure. In non-clinical studies (see Section 4.2), ADA incidence has generally been low and there was no apparent ADA impact on PK and safety in these studies. To date, the immunogenicity rates observed with astegolimab in clinical studies have been relatively low as well (see Immunogenicity Section 5.6). Several clinical studies have been conducted in patients with asthma, atopic dermatitis, COPD (IIS Study NG29528) and COVID-19 severe pneumonia, which generally showed low incidence of ADAs (Table 31). There has been no correlation between ADA status and clinical findings or increased incidence of AEs. Astegolimab is now being considered in a larger study for the treatment of COPD. This patient population is typically  considered to have a hyper-responsive immune system. Route of administration for this molecule will be [place], either Q2W or Q4W. These factors increase the risk of development of an immune response to astegolimab, specifically with repeat dosing. From the previous IIS Study [id number], incidence of ADAs was low in COPD patients; this remains to be confirmed in a larger study. To monitor ADA development in ongoing studies, serum samples will be collected from patients at protocol-defined intervals. Patients who test positive for antibodies and have clinical sequelae that are considered potentially related to an ADA response may also be asked to return for additional follow-up testing.  Drug Interaction Studies: No PK drug interaction studies have been conducted to date.  Serious Adverse Reactions Observed in Asthma Study: During the treatment period, a similar proportion of patients across all cohorts experienced at least one AE, regardless of causality (Table 22). In total, 77.2%, 70.9%, 72.2%, and 72.1% of patients reported at least 1 AE in the placebo, 70 mg, 210 mg, and 490 mg groups, respectively. The most common AE's (>5% in any treatment group) were asthma, nasopharyngitis, upper respiratory tract infection, headache, and injection site reaction (ISR). The most common drug-related AE was ISR, which was reported more frequently in the astegolimab treatment groups than in the placebo group (1 [0.8%] patient in the placebo group, 10 [7.9%] patients with 70 mg, 8 [6.3%] patients with 210 mg, 6 [4.9%] patients with 490 mg). All ISRs were non-serious and mild or moderate in severity. During the treatment period, 50 SAEs were reported in 37 (7.4%) patients. The number of patients reporting SAEs was comparable across all cohorts (11 SAEs in 8 patients on placebo, 21 SAEs in 14 patients  on 70 mg, 11 SAEs in 9 patients on 210 mg, and 7 SAEs in 6 patients on 490 mg). The most common SAE was asthma. One SAE of moderate  livedo reticularis (70 mg) was considered a suspected unexpected serious adverse drug reaction (SUSAR) related to astegolimab and was reported two days after the second dose, leading to discontinuation of astegolimab. Two (0.4%) patients reported anaphylaxis and hypersensitivity reactions: 1 severe SAE of anaphylactic reaction (placebo), and 1 moderate hypersensitivity (490 mg) considered related to astegolimab. Three (0.6%) patients experienced a potential Major Adverse Cardiac Event (MACE) (1 patient each in the placebo, 70 mg, and 210 mg groups). None of the potential MACE were considered related to astegolimab. Overall, 233 (46.4%) patients reported events of infection. A comparable number of patients reported infection across all treatment groups (65 [51.2%] patients on placebo, 55 [43.3%] patients on 70 mg, 58 [46.0%] patients on 210 mg, and 55 [45.1%] patients on 490 mg). The most frequently reported infection (>=10% incidence) was nasopharyngitis (12.7%). One patient on astegolimab 210 mg and the partner of one patient on placebo became pregnant during the study. Both delivered normal/healthy babies. Two deaths, unrelated to study drug, were reported: one patient on 210 mg astegolimab died following an SAE of asthma; the other patient on 490 mg astegolimab had an unexplained death. There were no clinically meaningful changes in laboratory parameters, vital signs, or ECG results, other than the 10% decrease in mean blood eosinophil counts in the astegolimab-treated groups, with no safety concerns. Treatment-induced ADAs were comparable between the astegolimab groups and had no impact on safety. Overall, astegolimab was well tolerated at all doses used and had a safety profile consistent with that observed in the previous astegolimab Phase I studies.  Serious Adverse Reactions Observed in Previous COPD Study: In the completed IIS Study [id number], a total of 81 patients received at least one dose of  astegolimab or placebo. The safety profile of astegolimab was similar to that of placebo. There were a total of 222 AEs reported in 62 patients. A total of 28 (72%) patients in the placebo arm and 34 (81%) patients in the astegolimab arm reported at least one AE. The most commonly reported AEs were headache followed by urinary tract infection and viral upper respiratory tract infection. A total of 39 SAEs were reported in 28 (35%) patients; 16 (41%) patients in the placebo group and 12 (29%) in the astegolimab group. The most commonly reported SAE was hospital admission for community acquired pneumonia. Four SAEs resulted in patient discontinuation from treatment (1 patient in the placebo group and 3 patients in the astegolimab group). One patient in the placebo group experienced an AESI of potential MACE (heart failure, unrelated to the study treatment). No anaphylaxis or pregnancies were reported. Two deaths, unrelated to study treatment, were reported: 1 patient in the placebo group died after hospital acquired pneumonia, and another patient in the placebo group died after pneumonia and type 2 respiratory failure.  Safety Data: Astegolimab has been generally well tolerated. There have been 49 patient deaths across all astegolimab studies (Section 5.5.2), none of which were considered related to astegolimab. A total of 144 subjects experienced a total of 224 SAEs across all astegolimab studies. Of these, an SAE livedo reticularis observed in 1 patient was considered related to astegolimab by the investigator (Section 5.5.3). AEs leading to withdrawal (Section 5.5.4) have generally occurred at a rate similar to what would be expected for clinical trials in the studies' respective indications.  [facility] [place] Clinical Research Coordinator note :    This visit for Subject [id number] with DOB: [birth year] on 17 Oct 2023 for the above protocol is Visit/Encounter #  and is for purpose of research.    Subject  expressed continued interest and consent in continuing as a study subject. Subject confirmed that there was no change in contact information (e.g. address, telephone, email). Subject thanked for participation in research and contribution to science.   The Subject was informed that the PI Dr. Marchelle Gearing continues to have oversight of the subject's visits and course  through relevant discussions, reviews and also specifically of this visit by routing of this note to the PI.  Subject returned on this visit for repeat spirometry.  EKG was repeated per sub investigator request.    Signed by Laurelyn Sickle Clinical Research Coordinator  [facility]  [place], [place]

## 2023-10-18 ENCOUNTER — Ambulatory Visit (INDEPENDENT_AMBULATORY_CARE_PROVIDER_SITE_OTHER): Admitting: Physician Assistant

## 2023-10-18 ENCOUNTER — Telehealth: Payer: Self-pay | Admitting: Internal Medicine

## 2023-10-18 DIAGNOSIS — R079 Chest pain, unspecified: Secondary | ICD-10-CM

## 2023-10-18 DIAGNOSIS — R300 Dysuria: Secondary | ICD-10-CM

## 2023-10-18 DIAGNOSIS — N3946 Mixed incontinence: Secondary | ICD-10-CM

## 2023-10-18 DIAGNOSIS — I2510 Atherosclerotic heart disease of native coronary artery without angina pectoris: Secondary | ICD-10-CM

## 2023-10-18 LAB — MICROSCOPIC EXAMINATION: Epithelial Cells (non renal): >10 /HPF — AB (ref 0–10)

## 2023-10-18 LAB — URINALYSIS, COMPLETE
Bilirubin, UA: NEGATIVE
Glucose, UA: NEGATIVE
Ketones, UA: NEGATIVE
Nitrite, UA: NEGATIVE
Protein,UA: NEGATIVE
RBC, UA: NEGATIVE
Specific Gravity, UA: 1.015 (ref 1.005–1.030)
Urobilinogen, Ur: 0.2 mg/dL (ref 0.2–1.0)
pH, UA: 7 (ref 5.0–7.5)

## 2023-10-18 NOTE — Progress Notes (Signed)
 PTNS  Session # 16  Health & Social Factors: no change Caffeine: 2-3 Alcohol: 0 Daytime voids #per day: 3 Night-time voids #per night: 2 Urgency: strong Incontinence Episodes #per day: 2 Ankle used: right Treatment Setting: 8 Feeling/ Response: both Comments: Patient tolerated well. She has had persistent dysuria; urine testing at last visit showed microscopic hematuria but culture grew mixed urogenital flora. Will repeat UA/Ucx/atypical cx today and contact with results, considering hematuria workup as indicated.  Performed By: Carman Ching, PA-C   Follow Up: 1 month + will call with results

## 2023-10-18 NOTE — Progress Notes (Signed)
 Maria [name],DOB 1955-10-09, was seen as subject in a clinical trial /Protocol # [id number] Cardiopulmonary symptoms are intermittent and variable.  She states that she can have dyspnea even at rest, especially with exposure to cold weather or increased humidity.  She describes cough for several weeks.  Initially there was scant yellow sputum, but it is now clear.   She also describes intermittent "heavy, squeezing" chest pain at least once a day with exertion such as sweeping the floor.  She states that this has been present since she had active COVID infection in 2023.  The chest discomfort lasts until she rests for an hour and a half.  This is in the context of documented advanced atherosclerosis of the left carotid and aortic atherosclerosis as per CT angio of the neck on 02/14/2023.  She has had a balloon procedure of the left carotid.  Other or new symptoms include chronic extrinsic conjunctivitis described as itchy eyes.  She also has been employing a cane because of pain in the left foot which radiates to the left hip.  This is described as burning. There are no claudication features. Pertinent physical findings include: There is a tracheostomy scar over the anterior neck.  She has a raspy dry cough.  Sticky rales are noted in the right lower lobe.  Heart sounds are distant.  There is a faint bruit over the left carotid.  Slight clubbing of the nailbeds is noted.  Straight leg raising sign in the left lower extremity is positive. All physical findings NCS. The atypical chest discomfort with exertion in the context of documented atherosclerosis may warrant further evaluation.  Differential diagnosis would include COVID "Long-Hauler" as well as atypical angina.  She is not a candidate for stress test or cath.  Consideration could be given to coronary artery calcium scoring assessment or chemical rest/ stress test.  This would be deferred to her PCP and Cardiologist.  L5 radiculopathy is suggested;  imaging and neurosurgical follow-up is deferred to PCP as well.                                                                                                                             Pecola Lawless MD,SI

## 2023-10-18 NOTE — Telephone Encounter (Signed)
    Maria [name] informed Dr Mariann Barter on a research visit in what sounds likes table exertional chest pain > 1 year and since Covid in Nov 2023. WE have never gotten this information from her in past. She does have coronary calvidications. Last stress test on file was in 2006  I have referred her to cardiology  Please let her know  Thanks  MR

## 2023-10-19 NOTE — Telephone Encounter (Signed)
 I called and spoke with the pt and notified of response from Dr. Marchelle Gearing  Pt verbalized understanding  She was agreeable to cards referral  Nothing further needed

## 2023-10-23 ENCOUNTER — Other Ambulatory Visit: Payer: Self-pay | Admitting: Internal Medicine

## 2023-10-23 ENCOUNTER — Other Ambulatory Visit: Payer: Self-pay | Admitting: Nurse Practitioner

## 2023-10-23 DIAGNOSIS — S76012D Strain of muscle, fascia and tendon of left hip, subsequent encounter: Secondary | ICD-10-CM

## 2023-10-23 DIAGNOSIS — I700 Atherosclerosis of aorta: Secondary | ICD-10-CM

## 2023-10-23 DIAGNOSIS — F320 Major depressive disorder, single episode, mild: Secondary | ICD-10-CM

## 2023-10-23 NOTE — Telephone Encounter (Signed)
 Last Fill: 09/25/2023  Next Visit: No follow-ups on file.   Last Visit: 09/25/2023  Dx: Tear of left gluteus minimus tendon, subsequent encounter   Current Dose per office note on 09/25/2023: Flexeril 5 mg at night  Okay to refill Flexeril?

## 2023-10-24 ENCOUNTER — Other Ambulatory Visit: Payer: Self-pay | Admitting: Nurse Practitioner

## 2023-10-24 DIAGNOSIS — I700 Atherosclerosis of aorta: Secondary | ICD-10-CM

## 2023-10-24 LAB — CULTURE, URINE COMPREHENSIVE

## 2023-10-25 LAB — MYCOPLASMA / UREAPLASMA CULTURE
Mycoplasma hominis Culture: NEGATIVE
Ureaplasma urealyticum: NEGATIVE

## 2023-11-01 DIAGNOSIS — S92352S Displaced fracture of fifth metatarsal bone, left foot, sequela: Secondary | ICD-10-CM | POA: Diagnosis not present

## 2023-11-01 DIAGNOSIS — M5010 Cervical disc disorder with radiculopathy, unspecified cervical region: Secondary | ICD-10-CM | POA: Diagnosis not present

## 2023-11-01 DIAGNOSIS — G894 Chronic pain syndrome: Secondary | ICD-10-CM | POA: Diagnosis not present

## 2023-11-01 DIAGNOSIS — M25511 Pain in right shoulder: Secondary | ICD-10-CM | POA: Diagnosis not present

## 2023-11-05 ENCOUNTER — Other Ambulatory Visit: Payer: Self-pay

## 2023-11-06 ENCOUNTER — Other Ambulatory Visit: Payer: Self-pay

## 2023-11-08 ENCOUNTER — Encounter

## 2023-11-08 DIAGNOSIS — J441 Chronic obstructive pulmonary disease with (acute) exacerbation: Secondary | ICD-10-CM

## 2023-11-08 MED ORDER — STUDY - ARNASA GB43374 (OPEN-LABEL) - ASTEGOLIMAB 238 MG/1.7 ML SQ INJECTION (PI-RAMASWAMY)
476.0000 mg | INJECTION | SUBCUTANEOUS | Status: AC
  Administered 2023-11-08: 476 mg via SUBCUTANEOUS
  Filled 2023-11-08: qty 3.4

## 2023-11-08 NOTE — Research (Signed)
 Title: A phase III Open-Label extension study to evaluate the long-term safety of Astegolimab in patiens with Chronic Obstructive Pulmonary disease.   Dose and Duration of Treatment: Astegolimab is presented as a sterile, slightly brown-yellow solution. Each single-use, 2.25 mL pre-filled syringe contains 1.7 mL deliverable volume. Astegolimab drug product is formulated at 140 mg/mL astegolimab with 114 mM succinic acid, 200 mM L-arginine, 10 mM L-methionine, 0.06% (w/v) polysorbate 20, pH 5.7. Placebo for astegolimab is supplied in an identical pre-filled syringe configuration.   Protocol # [id number] Sponsor: [employer] [address] [place]  Protocol Version: v3 dated 15Oct2024 IB: version 9.0 dated April 2024 ICF:  ICFs: Make sure that the subject is re-consented with the updated ICFs Main ICF: [facility] Approved Version 15 Jun 2023, Revised 28Nov2024 Mobile Nursing: [facility] [facility] Approved Version 26Oct2023, Revised 08Aug2024 Optional Home Study Treatment: IRB Approved Version 12 May 2022 Revised 23 Feb 2023 ADDENDUM 1: IRB Approved Version 24Jul2024 Revised 8Aug2024 Lab Manual: V2.1.0 dated 21Jun2024      Mechanism of action: Astegolimab (also known as [id number] or [id number]) is a fully human, IgG2 monoclonal antibody that binds with high affinity to the interleukin (IL)-33 (IL-33) receptor, ST2, thereby blocking the signaling of IL-33, an inflammatory cytokine of the IL-1 family and member of the "alarmin" class of molecules. Astegolimab binds with high affinity to the human and cynomolgus monkey receptor for IL-33, ST2, and blocks IL-33 binding, thus inhibiting association with the IL-1R accessory protein (AcP) co-receptor and formation of an activated receptor complex.   Key Inclusion Criteria: Able and willing to provide written informed consent and to comply with the study protocol. Ability to comply with the requirements of study protocol,  according to the investigators's best judgement. Completion of the 52-week treatment period in either [place] or Study [id number].  Key Exclusion Criteria: Significant non- compliance in the parent study, specifically defined as missing scheduled visits, per investigators judgement. Any new clinically significant pulmonary disease other than COPD since enrolling in the study. Unstable cardiac disease, myocardial infarction, or [place]  Heart Association Class III or IV heart failure since enrolling in the parent study.      Integrated Pharmacokinetic/Pharmacodynamic Analysis:  In Studies [id number], [id number], and [id number], exploratory biomarker analysis showed there were consistent decreases in blood eosinophil counts throughout the treatment period, potentially mediated by a direct effect of IL-33 on eosinophil progenitors. In [place], there was no significant difference in fractional exhaled nitric oxide (FeNO) levels (reflecting airway IL-4/IL-13 activity) between astegolimab-treated groups relative to placebo throughout the treatment period. These data suggest that astegolimab has only a limited effect on Type 2 inflammation in asthma. No data are applicable for Study [id number]. No data are available yet for Study [id number].  Special Warnings/Considerations: Administration of astegolimab, a protein therapeutic, may lead to the development of anti-astegolimab antibodies which could lead to AEs and/or decreased exposure. In non-clinical studies (see Section 4.2), ADA incidence has generally been low and there was no apparent ADA impact on PK and safety in these studies. To date, the immunogenicity rates observed with astegolimab in clinical studies have been relatively low as well (see Immunogenicity Section 5.6). Several clinical studies have been conducted in patients with asthma, atopic dermatitis, COPD (IIS Study [id number]) and COVID-19 severe pneumonia, which generally showed low incidence of ADAs  (Table 31). There has been no correlation between ADA status and clinical findings or increased incidence of AEs. Astegolimab is now being considered in a larger study for  the treatment of COPD. This patient population is typically considered to have a hyper-responsive immune system. Route of administration for this molecule will be [place], either Q2W or Q4W. These factors increase the risk of development of an immune response to astegolimab, specifically with repeat dosing. From the previous IIS Study [id number], incidence of ADAs was low in COPD patients; this remains to be confirmed in a larger study. To monitor ADA development in ongoing studies, serum samples will be collected from patients at protocol-defined intervals. Patients who test positive for antibodies and have clinical sequelae that are considered potentially related to an ADA response may also be asked to return for additional follow-up testing.  Drug Interaction Studies: No PK drug interaction studies have been conducted to date.  Serious Adverse Reactions Observed in Asthma Study: During the treatment period, a similar proportion of patients across all cohorts experienced at least one AE, regardless of causality (Table 22). In total, 77.2%, 70.9%, 72.2%, and 72.1% of patients reported at least 1 AE in the placebo, 70 mg, 210 mg, and 490 mg groups, respectively. The most common AE's (>5% in any treatment group) were asthma, nasopharyngitis, upper respiratory tract infection, headache, and injection site reaction (ISR). The most common drug-related AE was ISR, which was reported more frequently in the astegolimab treatment groups than in the placebo group (1 [0.8%] patient in the placebo group, 10 [7.9%] patients with 70 mg, 8 [6.3%] patients with 210 mg, 6 [4.9%] patients with 490 mg). All ISRs were non-serious and mild or moderate in severity. During the treatment period, 50 SAEs were reported in 37 (7.4%) patients. The number of patients reporting  SAEs was comparable across all cohorts (11 SAEs in 8 patients on placebo, 21 SAEs in 14 patients on 70 mg, 11 SAEs in 9 patients on 210 mg, and 7 SAEs in 6 patients on 490 mg). The most common SAE was asthma. One SAE of moderate livedo reticularis (70 mg) was considered a suspected unexpected serious adverse drug reaction (SUSAR) related to astegolimab and was reported two days after the second dose, leading to discontinuation of astegolimab. Two (0.4%) patients reported anaphylaxis and hypersensitivity reactions: 1 severe SAE of anaphylactic reaction (placebo), and 1 moderate hypersensitivity (490 mg) considered related to astegolimab. Three (0.6%) patients experienced a potential Major Adverse Cardiac Event (MACE) (1 patient each in the placebo, 70 mg, and 210 mg groups). None of the potential MACE were considered related to astegolimab. Overall, 233 (46.4%) patients reported events of infection. A comparable number of patients reported infection across all treatment groups (65 [51.2%] patients on placebo, 55 [43.3%] patients on 70 mg, 58 [46.0%] patients on 210 mg, and 55 [45.1%] patients on 490 mg). The most frequently reported infection (>=10% incidence) was nasopharyngitis (12.7%). One patient on astegolimab 210 mg and the partner of one patient on placebo became pregnant during the study. Both delivered normal/healthy babies. Two deaths, unrelated to study drug, were reported: one patient on 210 mg astegolimab died following an SAE of asthma; the other patient on 490 mg astegolimab had an unexplained death. There were no clinically meaningful changes in laboratory parameters, vital signs, or ECG results, other than the 10% decrease in mean blood eosinophil counts in the astegolimab-treated groups, with no safety concerns. Treatment-induced ADAs were comparable between the astegolimab groups and had no impact on safety. Overall, astegolimab was well tolerated at all doses used and had a safety profile  consistent with that observed in the previous astegolimab Phase I studies.  Serious  Adverse Reactions Observed in Previous COPD Study: In the completed IIS Study [id number], a total of 81 patients received at least one dose of astegolimab or placebo. The safety profile of astegolimab was similar to that of placebo. There were a total of 222 AEs reported in 62 patients. A total of 28 (72%) patients in the placebo arm and 34 (81%) patients in the astegolimab arm reported at least one AE. The most commonly reported AEs were headache followed by urinary tract infection and viral upper respiratory tract infection. A total of 39 SAEs were reported in 28 (35%) patients; 16 (41%) patients in the placebo group and 12 (29%) in the astegolimab group. The most commonly reported SAE was hospital admission for community acquired pneumonia. Four SAEs resulted in patient discontinuation from treatment (1 patient in the placebo group and 3 patients in the astegolimab group). One patient in the placebo group experienced an AESI of potential MACE (heart failure, unrelated to the study treatment). No anaphylaxis or pregnancies were reported. Two deaths, unrelated to study treatment, were reported: 1 patient in the placebo group died after hospital acquired pneumonia, and another patient in the placebo group died after pneumonia and type 2 respiratory failure.  Safety Data: Astegolimab has been generally well tolerated. There have been 49 patient deaths across all astegolimab studies (Section 5.5.2), none of which were considered related to astegolimab. A total of 144 subjects experienced a total of 224 SAEs across all astegolimab studies. Of these, an SAE livedo reticularis observed in 1 patient was considered related to astegolimab by the investigator (Section 5.5.3). AEs leading to withdrawal (Section 5.5.4) have generally occurred at a rate similar to what would be expected for clinical trials in the studies' respective  indications.   [facility] Clinical Research Coordinator note :    This visit for Subject [id number] with DOB: 1955/12/22 on 08 Nov 2023 for the above protocol is Visit/Encounter # Enrollment visit/day 1 and is for purpose of research.    Subject expressed continued interest and consent in continuing as a study subject. Subject confirmed that there was no change in contact information (e.g. address, telephone, email). Subject thanked for participation in research and contribution to science.  In this visit Enrollment/Day 1 the subject will be evaluated by Elspeth Hals This research coordinator has verified that the above investigator is up to date with his/her training logs.    The Subject was informed that the PI Dr. Bertrum Brodie continues to have oversight of the subject's visits and course  through relevant discussions, reviews and also specifically of this visit by routing of this note to the PI.    This visit is a key visit of  screening. The PI is available for this visit. In addition, ahead of the key visit of screening, the visit and subject were discussed with the PI Dr. Bertrum Brodie on date of 06 Nov 2023 face to face as part of direct PI oversight. [facility] Clinical Research Coordinator note:   This visit for Subject Maria [name] with DOB: [birth year] on 11/08/2023 for the above protocol is Visit/Encounter # Enrollment/ Day 1  and is for purpose of research.     Subject  expressed continued interest and consent in continuing as a study subject. Subject confirmed that there was  no  change in contact information (e.g. address, telephone, email). Subject thanked for participation in research and contribution to science. In this visit 11/08/2023 the subject will be evaluated by Sammie Crigler  Hopper,MD (Sub-Investigator) n. This research coordinator has verified that the above investigator is yes, up to date with his/her training logs.   The Subject was yes, informed that the PI  Maire Scot, MD continues to have oversight of the subject's visits and course through relevant discussions, reviews, and also specifically of this visit by routing of this note to the PI.   1. This visit is a key visit of randomization. The PI is not  available for this visit.    Because the PI is NOT available, the Sub-Investigator reported and CRC has confirmed that the PI has discussed the visit a-priori with the Sub-Investigator.  2.  In addition, ahead of the key visit of randomization the visit and subject were discussed with the PI Maire Scot, MD  on date of 06 Nov 2023 ( face to face) as part of direct PI oversight.   All assessments and procedures were performed per above stated protocol. Subject tolerated injections well. Please refer to subjects paper source binder for further details of this visit.   Signed by  Dara Ear  Clinical Research Coordinator / Nurse [facility]  [place], [place] 12:35 PM 11/08/2023

## 2023-11-09 ENCOUNTER — Telehealth: Payer: Self-pay | Admitting: Internal Medicine

## 2023-11-09 NOTE — Telephone Encounter (Signed)
 Maria [name] has carotid stenosis and copd and co artr calcifcation. Recently she told us  about "stable" angina ongoing since covid > 1 year ago.  She has appt with cards but only in August 2025. We are gettn nervous esp she is vocalizing about CP now. If you can see her sooner will be awesome ? Next 1-2 weeks. EKG ok  Thanks  MR

## 2023-11-09 NOTE — Progress Notes (Deleted)
Marland Kitchen  [email or web address]

## 2023-11-10 ENCOUNTER — Other Ambulatory Visit: Payer: Self-pay | Admitting: Primary Care

## 2023-11-10 DIAGNOSIS — Z8709 Personal history of other diseases of the respiratory system: Secondary | ICD-10-CM

## 2023-11-10 NOTE — Telephone Encounter (Signed)
 I will add her in on my half day in 2 weeks as we are moving and next week is hectic. Thanks

## 2023-11-10 NOTE — Progress Notes (Signed)
 Maria G.  [name], DOB Jul 16, 1956, was seen as subject in a clinical trial /Protocol # [id number]. Not diagnostic for unstable angina; but are stable; but she continues to have substernal pressure sensation after sweeping the floor or performing ADLs such as shopping.  This does resolve after 5 minutes of rest.  She states that she has been experiencing this for at least 6 months.  This atypical chest pain is in the context of documented aortic & coronay artery atherosclerosis on imaging.  She is not on nitroglycerin. Additionally she had a balloon procedure on the left carotid artery for symptomatic stenosis.  Despite this history and these symptoms; EKG does not reveal evidence of prior MI or ischemic ST-T changes.  Cardiology appointment had been scheduled for August of this year. She has minor yellow sputum production.Her mouth stays dry and she has itchy , but not dry, eyes.  She denies any other extrinsic symptoms. Other symptoms denied include left lower extremity neuropathic type pain with ambulation. Pertinent physical findings include: Well-healed trach scar is present.  Bilateral carotid bruits are present, greater on the left.  She has a grade 1/2 systolic murmur at the right base.  Fine rales are noted in the right lower lobe.  There is positive straight leg raising on the left.  She has scattered ecchymotic lesions and vitiliginous scarring of the forearms.. All physical findings are NCS. Her chest pain is atypical ; but Cardiology evaluation will be facilitated to rule out any anginal variant.                                                                                                                  Alinda Apley MD,SI

## 2023-11-13 NOTE — Telephone Encounter (Signed)
 I spoke with patient and scheduled her to see Dr Berry Bristol on 5/7 at 2:40

## 2023-11-15 ENCOUNTER — Ambulatory Visit: Admitting: Physician Assistant

## 2023-11-15 ENCOUNTER — Telehealth: Payer: Self-pay | Admitting: Nurse Practitioner

## 2023-11-15 NOTE — Telephone Encounter (Signed)
 There were direct messages with in epic. Issue has been taken care of

## 2023-11-15 NOTE — Telephone Encounter (Signed)
 CRM # [phone] Owner: None Status: Unresolved Open  Priority: High Created on: 11/15/2023 01:57 PM By: Delsie Figures   Primary Information  Source  Dr. Clarisse Crosby (Vendor/Third Party)   Subject     Topic  General - Other    Communication  Reason for CRM: Dr. Bertrum Brodie wants Dr. Tivis Forster to give him a call About patient Maria [name] [birth year]. Please call Dr. Marylee Snowball @ [phone]

## 2023-11-16 ENCOUNTER — Telehealth: Payer: Self-pay | Admitting: Internal Medicine

## 2023-11-16 NOTE — Telephone Encounter (Signed)
  Title: A Phase III, randomized, double-blind, placebo controlled, multicenter study to evaluate the efficacy and safety of astegolimab in patients with chronic obstructive pulmonary disease    Dose and Duration of Treatment: Astegolimab is presented as a sterile, slightly brown-yellow solution. Each single-use, 2.25 mL pre-filled syringe contains 1.7 mL deliverable volume. Astegolimab drug product is formulated at 140 mg/mL astegolimab with 114 mM succinic acid, 200 mM L-arginine, 10 mM L-methionine, 0.06% (w/v) polysorbate 20, pH 5.7. Placebo for astegolimab is supplied in an identical pre-filled syringe configuration.   Protocol # [id number]   Sponsor: [employer] [address] [place], [place]  xxxxxxxxxxxxxxxxxxxxxxxxxxxxxxxxxxxxxxxxxxxxxxxx      PI note for [employer] study - regarding AE documentaion of Low complment and ENA positive in sept 2024. Back then had documented as AEs. However,subject saw rheumatolgy March 2025 and at this time rash was resolved and no signs of inflmaation. I confoirmed this on rheum notes. Also yesterday I secure chatted PCP Dr Joelle Musca who saw patient insept 2024. He was aligned that these labs are now to be considered Not clinically significant , will remove it as AE    Latest Reference Range & Units 04/05/23 11:39  SEE BELOW  Comment  Anti Nuclear Antibody (ANA) Negative  Positive !  Atypical pANCA Neg:<1:20 titer <1:20  dsDNA Ab 0 - 9 IU/mL 2  ENA RNP Ab 0.0 - 0.9 AI 2.2 (H)  ENA SSA (RO) Ab 0.0 - 0.9 AI 0.3  ENA SSB (LA) Ab 0.0 - 0.9 AI <0.2  Cytoplasmic (C-ANCA) Neg:<1:20 titer <1:20  P-ANCA Neg:<1:20 titer <1:20  ENA SM Ab Ser-aCnc 0.0 - 0.9 AI <0.2  Anti-MPO Antibodies 0.0 - 0.9 units <0.2  Anti-PR3 Antibodies 0.0 - 0.9 units <0.2  !: Data is abnormal (H): Data is abnormally high   Latest Reference Range & Units 04/05/23 11:39 09/25/23 08:57  C3 Complement 83 - 193 mg/dL  161  C4 Complement 15 - 57 mg/dL  19  Compl,  Total (WR60) 31 - 60 U/mL <13 (L)   (L): Data is abnormally low    SIGNATURE    Dr. Maire Scot, M.D., F.C.C.P, ACRP-CPI Pulmonary and Critical Care Medicine Research Investigator, [facility] Staff Physician, [facility] Center Director - Interstitial Lung Disease  Program  Pulmonary Fibrosis [place] [facility] and [facility] [place], [place], [address]   Pager: [phone], If no answer  OR between  19:00-7:00h: page [phone] Telephone (research): [phone]  8:39 AM 11/16/2023   8:39 AM 11/16/2023

## 2023-11-21 ENCOUNTER — Ambulatory Visit: Admitting: Physician Assistant

## 2023-11-21 DIAGNOSIS — N3946 Mixed incontinence: Secondary | ICD-10-CM

## 2023-11-21 NOTE — Progress Notes (Signed)
 PTNS  Session # 17  Health & Social Factors: no change Caffeine: 3 Alcohol: 0 Daytime voids #per day: 3 Night-time voids #per night: 2 Urgency: strong Incontinence Episodes #per day: 4, including SUI Ankle used: right Treatment Setting: 9 Feeling/ Response: sensory Comments: Patient tolerated well.  Performed By: Ruthanna Macchia, PA-C   Follow Up: 1 month

## 2023-11-22 ENCOUNTER — Other Ambulatory Visit (HOSPITAL_COMMUNITY): Payer: Self-pay

## 2023-11-22 ENCOUNTER — Encounter: Admitting: *Deleted

## 2023-11-22 ENCOUNTER — Encounter: Payer: Self-pay | Admitting: Cardiology

## 2023-11-22 ENCOUNTER — Ambulatory Visit: Attending: Cardiology | Admitting: Cardiology

## 2023-11-22 VITALS — BP 134/65 | HR 63 | Resp 16 | Ht 67.0 in | Wt 161.6 lb

## 2023-11-22 DIAGNOSIS — J441 Chronic obstructive pulmonary disease with (acute) exacerbation: Secondary | ICD-10-CM

## 2023-11-22 DIAGNOSIS — E7800 Pure hypercholesterolemia, unspecified: Secondary | ICD-10-CM | POA: Diagnosis not present

## 2023-11-22 DIAGNOSIS — I2089 Other forms of angina pectoris: Secondary | ICD-10-CM | POA: Diagnosis not present

## 2023-11-22 DIAGNOSIS — R0609 Other forms of dyspnea: Secondary | ICD-10-CM

## 2023-11-22 DIAGNOSIS — I739 Peripheral vascular disease, unspecified: Secondary | ICD-10-CM

## 2023-11-22 DIAGNOSIS — I771 Stricture of artery: Secondary | ICD-10-CM

## 2023-11-22 MED ORDER — NITROGLYCERIN 0.4 MG SL SUBL
0.4000 mg | SUBLINGUAL_TABLET | SUBLINGUAL | 1 refills | Status: AC | PRN
  Filled 2023-11-22: qty 25, 8d supply, fill #0

## 2023-11-22 MED ORDER — METOPROLOL SUCCINATE ER 25 MG PO TB24
25.0000 mg | ORAL_TABLET | Freq: Every evening | ORAL | 2 refills | Status: DC
Start: 2023-11-22 — End: 2024-06-11
  Filled 2023-11-22: qty 30, 30d supply, fill #0
  Filled 2023-12-19: qty 30, 30d supply, fill #1
  Filled 2024-04-08: qty 30, 30d supply, fill #2

## 2023-11-22 MED ORDER — ISOSORBIDE MONONITRATE ER 30 MG PO TB24
30.0000 mg | ORAL_TABLET | ORAL | 2 refills | Status: DC
  Filled 2023-11-22: qty 30, 30d supply, fill #0
  Filled 2023-12-19: qty 30, 30d supply, fill #1
  Filled 2024-02-06: qty 30, 30d supply, fill #2

## 2023-11-22 MED ORDER — STUDY - ARNASA GB43374 (OPEN-LABEL) - ASTEGOLIMAB 238 MG/1.7 ML SQ INJECTION (PI-RAMASWAMY)
476.0000 mg | INJECTION | SUBCUTANEOUS | Status: AC
  Administered 2023-11-22: 476 mg via SUBCUTANEOUS
  Filled 2023-11-22: qty 3.4

## 2023-11-22 MED ORDER — ATORVASTATIN CALCIUM 20 MG PO TABS
20.0000 mg | ORAL_TABLET | Freq: Every day | ORAL | 1 refills | Status: DC
  Filled 2023-11-22 (×2): qty 90, 90d supply, fill #0
  Filled 2024-03-11: qty 90, 90d supply, fill #1

## 2023-11-22 NOTE — Research (Signed)
 Title: A phase III Open-Label extension study to evaluate the long-term safety of Astegolimab in patiens with Chronic Obstructive Pulmonary disease.   Dose and Duration of Treatment: Astegolimab is presented as a sterile, slightly brown-yellow solution. Each single-use, 2.25 mL pre-filled syringe contains 1.7 mL deliverable volume. Astegolimab drug product is formulated at 140 mg/mL astegolimab with 114 mM succinic acid, 200 mM L-arginine, 10 mM L-methionine, 0.06% (w/v) polysorbate 20, pH 5.7. Placebo for astegolimab is supplied in an identical pre-filled syringe configuration.   Protocol # [id number] Sponsor: [employer] [address] [place]   Protocol Version: v3 dated 15Oct2024 IB: version 9.0 dated April 2024 ICF:  ICFs: Make sure that the Maria is re-consented with the updated ICFs Main ICF: [employer] Approved Version 15 Jun 2023, Revised 28Nov2024 Mobile Nursing: [employer] Approved Version 26Oct2023, Revised 08Aug2024 Optional Home Study Treatment: IRB Approved Version 12 May 2022 Revised 23 Feb 2023 ADDENDUM 1: IRB Approved Version 24Jul2024 Revised 8Aug2024 Lab Manual: V2.1.0 dated 21Jun2024   Mechanism of action: Astegolimab (also known as [id number] or [id number]) is a fully human, IgG2 monoclonal antibody that binds with high affinity to the interleukin (IL)-33 (IL-33) receptor, ST2, thereby blocking the signaling of IL-33, an inflammatory cytokine of the IL-1 family and member of the "alarmin" class of molecules. Astegolimab binds with high affinity to the human and cynomolgus monkey receptor for IL-33, ST2, and blocks IL-33 binding, thus inhibiting association with the IL-1R accessory protein (AcP) co-receptor and formation of an activated receptor complex.   Key Inclusion Criteria: Able and willing to provide written informed consent and to comply with the study protocol. Ability to comply with the requirements of study protocol, according to  the investigators's best judgement. Completion of the 52-week treatment period in either [place] or Study [id number].  Key Exclusion Criteria: Significant non- compliance in the parent study, specifically defined as missing scheduled visits, per investigators judgement. Any new clinically significant pulmonary disease other than COPD since enrolling in the study. Unstable cardiac disease, myocardial infarction, or [place]  Heart Association Class III or IV heart failure since enrolling in the parent study.      Integrated Pharmacokinetic/Pharmacodynamic Analysis:  In Studies [id number], [id number], and [id number], exploratory biomarker analysis showed there were consistent decreases in blood eosinophil counts throughout the treatment period, potentially mediated by a direct effect of IL-33 on eosinophil progenitors. In [place], there was no significant difference in fractional exhaled nitric oxide (FeNO) levels (reflecting airway IL-4/IL-13 activity) between astegolimab-treated groups relative to placebo throughout the treatment period. These data suggest that astegolimab has only a limited effect on Type 2 inflammation in asthma. No data are applicable for Study [id number]. No data are available yet for Study [id number].  Special Warnings/Considerations: Administration of astegolimab, a protein therapeutic, may lead to the development of anti-astegolimab antibodies which could lead to AEs and/or decreased exposure. In non-clinical studies (see Section 4.2), ADA incidence has generally been low and there was no apparent ADA impact on PK and safety in these studies. To date, the immunogenicity rates observed with astegolimab in clinical studies have been relatively low as well (see Immunogenicity Section 5.6). Several clinical studies have been conducted in patients with asthma, atopic dermatitis, COPD (IIS Study [id number]) and COVID-19 severe pneumonia, which generally showed low incidence of ADAs (Table 31).  There has been no correlation between ADA status and clinical findings or increased incidence of AEs. Astegolimab is now being considered in a larger study for the treatment  of COPD. This patient population is typically considered to have a hyper-responsive immune system. Route of administration for this molecule will be Jennings, either Q2W or Q4W. These factors increase the risk of development of an immune response to astegolimab, specifically with repeat dosing. From the previous IIS Study [id number], incidence of ADAs was low in COPD patients; this remains to be confirmed in a larger study. To monitor ADA development in ongoing studies, serum samples will be collected from patients at protocol-defined intervals. Patients who test positive for antibodies and have clinical sequelae that are considered potentially related to an ADA response may also be asked to return for additional follow-up testing.  Drug Interaction Studies: No PK drug interaction studies have been conducted to date.  Serious Adverse Reactions Observed in Asthma Study: During the treatment period, a similar proportion of patients across all cohorts experienced at least one AE, regardless of causality (Table 22). In total, 77.2%, 70.9%, 72.2%, and 72.1% of patients reported at least 1 AE in the placebo, 70 mg, 210 mg, and 490 mg groups, respectively. The most common AE's (>5% in any treatment group) were asthma, nasopharyngitis, upper respiratory tract infection, headache, and injection site reaction (ISR). The most common drug-related AE was ISR, which was reported more frequently in the astegolimab treatment groups than in the placebo group (1 [0.8%] patient in the placebo group, 10 [7.9%] patients with 70 mg, 8 [6.3%] patients with 210 mg, 6 [4.9%] patients with 490 mg). All ISRs were non-serious and mild or moderate in severity. During the treatment period, 50 SAEs were reported in 37 (7.4%) patients. The number of patients reporting SAEs was  comparable across all cohorts (11 SAEs in 8 patients on placebo, 21 SAEs in 14 patients on 70 mg, 11 SAEs in 9 patients on 210 mg, and 7 SAEs in 6 patients on 490 mg). The most common SAE was asthma. One SAE of moderate livedo reticularis (70 mg) was considered a suspected unexpected serious adverse drug reaction (SUSAR) related to astegolimab and was reported two days after the second dose, leading to discontinuation of astegolimab. Two (0.4%) patients reported anaphylaxis and hypersensitivity reactions: 1 severe SAE of anaphylactic reaction (placebo), and 1 moderate hypersensitivity (490 mg) considered related to astegolimab. Three (0.6%) patients experienced a potential Major Adverse Cardiac Event (MACE) (1 patient each in the placebo, 70 mg, and 210 mg groups). None of the potential MACE were considered related to astegolimab. Overall, 233 (46.4%) patients reported events of infection. A comparable number of patients reported infection across all treatment groups (65 [51.2%] patients on placebo, 55 [43.3%] patients on 70 mg, 58 [46.0%] patients on 210 mg, and 55 [45.1%] patients on 490 mg). The most frequently reported infection (>=10% incidence) was nasopharyngitis (12.7%). One patient on astegolimab 210 mg and the partner of one patient on placebo became pregnant during the study. Both delivered normal/healthy babies. Two deaths, unrelated to study drug, were reported: one patient on 210 mg astegolimab died following an SAE of asthma; the other patient on 490 mg astegolimab had an unexplained death. There were no clinically meaningful changes in laboratory parameters, vital signs, or ECG results, other than the 10% decrease in mean blood eosinophil counts in the astegolimab-treated groups, with no safety concerns. Treatment-induced ADAs were comparable between the astegolimab groups and had no impact on safety. Overall, astegolimab was well tolerated at all doses used and had a safety profile consistent with  that observed in the previous astegolimab Phase I studies.  Serious Adverse Reactions  Observed in Previous COPD Study: In the completed IIS Study [id number], a total of 81 patients received at least one dose of astegolimab or placebo. The safety profile of astegolimab was similar to that of placebo. There were a total of 222 AEs reported in 62 patients. A total of 28 (72%) patients in the placebo arm and 34 (81%) patients in the astegolimab arm reported at least one AE. The most commonly reported AEs were headache followed by urinary tract infection and viral upper respiratory tract infection. A total of 39 SAEs were reported in 28 (35%) patients; 16 (41%) patients in the placebo group and 12 (29%) in the astegolimab group. The most commonly reported SAE was hospital admission for community acquired pneumonia. Four SAEs resulted in patient discontinuation from treatment (1 patient in the placebo group and 3 patients in the astegolimab group). One patient in the placebo group experienced an AESI of potential MACE (heart failure, unrelated to the study treatment). No anaphylaxis or pregnancies were reported. Two deaths, unrelated to study treatment, were reported: 1 patient in the placebo group died after hospital acquired pneumonia, and another patient in the placebo group died after pneumonia and type 2 respiratory failure.  Safety Data: Astegolimab has been generally well tolerated. There have been 49 patient deaths across all astegolimab studies (Section 5.5.2), none of which were considered related to astegolimab. A total of 144 subjects experienced a total of 224 SAEs across all astegolimab studies. Of these, an SAE livedo reticularis observed in 1 patient was considered related to astegolimab by the investigator (Section 5.5.3). AEs leading to withdrawal (Section 5.5.4) have generally occurred at a rate similar to what would be expected for clinical trials in the studies' respective indications.   [facility]  [place] Clinical Research Coordinator note :    This visit for Maria [name] with DOB: 13-Sep-1955 on 22 Nov 2023 for the above protocol is Visit/Encounter #week 2  and is for purpose of research.    Maria expressed continued interest and consent in continuing as a study Maria. Maria confirmed that there was no change in contact information (e.g. address, telephone, email). Maria thanked for participation in research and contribution to science.  The Maria was informed that the PI Dr. Bertrum Brodie continues to have oversight of the Maria's visits and course  through relevant discussions, reviews and also specifically of this visit by routing of this note to the PI. All assessments and procedures were performed per above stated protocol. Maria tolerated injections well. Please refer to subjects paper source binder for further details of this visit.      Signed by Dara Ear Clinical Research Coordinator  [facility]  [place], [place]

## 2023-11-22 NOTE — Patient Instructions (Signed)
 Medication Instructions:  Your physician has recommended you make the following change in your medication:   1) START atorvastatin  (Lipitor) 20 mg daily 2) START isosorbide mononitrate (Imdur) 30 mg daily in the morning 3) START metoprolol  succinate (Toprol  XL) 25 mg daily in the evening 4) Nitroglycerin 0.4 mg as needed for chest pain. Dissolve 1 tablet under the tongue every 5 minutes as needed for chest pain. Max of 3 doses, then call 911.   *If you need a refill on your cardiac medications before your next appointment, please call your pharmacy*  Lab Work: In 6 weeks: Lipid panel If you have labs (blood work) drawn today and your tests are completely normal, you will receive your results only by: MyChart Message (if you have MyChart) OR A paper copy in the mail If you have any lab test that is abnormal or we need to change your treatment, we will call you to review the results.  Testing/Procedures: Your physician has requested that you have a lexiscan myoview. For further information please visit [email or web address] Please follow instruction sheet, as given.   Your physician has requested that you have an echocardiogram. Echocardiography is a painless test that uses sound waves to create images of your heart. It provides your doctor with information about the size and shape of your heart and how well your heart's chambers and valves are working. This procedure takes approximately one hour. There are no restrictions for this procedure. Please do NOT wear cologne, perfume, aftershave, or lotions (deodorant is allowed). Please arrive 15 minutes prior to your appointment time.  Please note: We ask at that you not bring children with you during ultrasound (echo/ vascular) testing. Due to room size and safety concerns, children are not allowed in the ultrasound rooms during exams. Our front office staff cannot provide observation of children in our lobby area while testing is being conducted.  An adult accompanying a patient to their appointment will only be allowed in the ultrasound room at the discretion of the ultrasound technician under special circumstances. We apologize for any inconvenience.  Follow-Up: At [facility], you and your health needs are our priority.  As part of our continuing mission to provide you with exceptional heart care, our providers are all part of one team.  This team includes your primary Cardiologist (physician) and Advanced Practice Providers or APPs (Physician Assistants and Nurse Practitioners) who all work together to provide you with the care you need, when you need it.  Your next appointment:   2 month(s)  The format for your next appointment:   In Person  Provider:   Knox Perl, MD{  We recommend signing up for the patient portal called "MyChart".  Sign up information is provided on this After Visit Summary.  MyChart is used to connect with patients for Virtual Visits (Telemedicine).  Patients are able to view lab/test results, encounter notes, upcoming appointments, etc.  Non-urgent messages can be sent to your provider as well.   To learn more about what you can do with MyChart, go to [email or web address].   Other Instructions Lexiscan Myoview (Stress Test) Instructions  Please arrive 15 minutes prior to your appointment time for registration and insurance purposes.   The test will take approximately 3 to 4 hours to complete; you may bring reading material.  If someone comes with you to your appointment, they will need to remain in the main lobby due to limited space in the testing area. **If you are pregnant or breastfeeding, please  notify the nuclear lab prior to your appointment**   How to prepare for your Myocardial Perfusion Test: Do not eat or drink 3 hours prior to your test, except you may have water. Do not consume products containing caffeine (regular or decaffeinated) 12 hours prior to your test. (ex: coffee,  chocolate, sodas, tea). Do bring a list of your current medications with you.  If not listed below, you may take your medications as normal. Do wear comfortable clothes (no dresses or overalls) and walking shoes, tennis shoes preferred (No heels or open toe shoes are allowed). Do NOT wear cologne, perfume, aftershave, or lotions (deodorant is allowed). If these instructions are not followed, your test will have to be rescheduled.   Please report to [address] for your test.  If you have questions or concerns about your appointment, you can call the Nuclear Lab at [phone].   If you cannot keep your appointment, please provide 24 hours notification to the Nuclear Lab, to avoid a possible $50 charge to your account.

## 2023-11-22 NOTE — Progress Notes (Signed)
 Cardiology Office Note:  .   Date:  11/22/2023  ID:  Maria [name], DOB [birth year], MRN [id number] PCP: Dorothe Gaster, NP  [facility] Providers Cardiologist:  Knox Perl, MD   History of Present Illness: .   Maria [name] is a 68 y.o. Caucasian female patient with history of left carotid balloon angioplasty on 03/07/2023 could not place a stent, COPD with pulmonary emphysema, prior tobacco use disorder qui9t smoking in 2017, chronic respiratory hypoxemic failure on chronic 3 to 5 L of oxygen  at baseline, hypercholesterolemia, referred to me by Dr. Bertrum Brodie for management of chest discomfort that started sometime a year ago.    She has chronic dyspnea, she has coronary calcification noted on the CT scan and hypercholesterolemia as risk factors.  Discussed the use of AI scribe software for clinical note transcription with the patient, who gave verbal consent to proceed.  History of Present Illness Maria [name] "Maria [name]" is a 68 year old female with COPD and chronic respiratory hypoxemic failure who presents with chest pain. She was referred by Dr. Bertrum Brodie for evaluation of chest disease.  She experiences chest pain described as tightness and heaviness during physical activities such as sweeping, which necessitates stopping the activity. This pain has been present for about a year, has worsened over time, and occurs at least twice a week, impacting her ability to complete household chores.  She has COPD and chronic respiratory hypoxemic failure, requiring continuous oxygen  therapy at 3 to 5 liters depending on her activity level. She quit smoking in 2017 after developing COPD.  She has difficulty sleeping, as she cannot lie on her right side due to a sensation of chest tightness. She sleeps either sitting up or on her left side.  Labs   Lab Results  Component Value Date   CHOL 143 03/01/2023   HDL 56.20 03/01/2023   LDLCALC 69 03/01/2023   TRIG 92.0 03/01/2023   CHOLHDL 3  03/01/2023   Lab Results  Component Value Date   NA 135 05/12/2023   K 3.9 05/12/2023   CO2 24 05/12/2023   GLUCOSE 95 05/12/2023   BUN 17 05/12/2023   CREATININE 0.63 05/12/2023   CALCIUM  9.0 05/12/2023   GFR 68.59 05/01/2023   GFRNONAA >60 05/12/2023      Latest Ref Rng & Units 05/12/2023    6:27 AM 05/11/2023    5:29 AM 05/10/2023    4:40 PM  BMP  Glucose 70 - 99 mg/dL 95  409  811   BUN 8 - 23 mg/dL 17  15  18    Creatinine 0.44 - 1.00 mg/dL 9.14  7.82  9.56   Sodium 135 - 145 mmol/L 135  138  140   Potassium 3.5 - 5.1 mmol/L 3.9  4.5  3.5   Chloride 98 - 111 mmol/L 101  102  106   CO2 22 - 32 mmol/L 24  26  26    Calcium  8.9 - 10.3 mg/dL 9.0  9.0  8.9       Latest Ref Rng & Units 05/12/2023    6:27 AM 05/11/2023    5:29 AM 05/10/2023    4:40 PM  CBC  WBC 4.0 - 10.5 K/uL 15.2  13.1  13.1   Hemoglobin 12.0 - 15.0 g/dL 21.3  08.6  57.8   Hematocrit 36.0 - 46.0 % 32.2  32.4  36.4   Platelets 150 - 400 K/uL 256  252  300    Lab Results  Component  Value Date   HGBA1C 5.7 03/01/2023    Lab Results  Component Value Date   TSH 2.16 03/01/2023     ROS  Review of Systems  Cardiovascular:  Positive for chest pain, claudication (right arm), dyspnea on exertion and orthopnea. Negative for leg swelling.    Physical Exam:   VS:  BP 134/65 (BP Location: Left Arm, Patient Position: Sitting, Cuff Size: Large)   Pulse 63   Resp 16   Ht 5\' 7"  (1.702 m)   Wt 161 lb 9.6 oz (73.3 kg)   SpO2 95% Comment: 4L  BMI 25.31 kg/m    Wt Readings from Last 3 Encounters:  11/22/23 161 lb 9.6 oz (73.3 kg)  10/03/23 159 lb 9.6 oz (72.4 kg)  09/25/23 158 lb (71.7 kg)    Physical Exam Neck:     Vascular: Carotid bruit (bilateral) present. No JVD.  Cardiovascular:     Rate and Rhythm: Normal rate and regular rhythm.     Pulses: Intact distal pulses.          Femoral pulses are 2+ on the right side and 0 on the left side.      Popliteal pulses are 2+ on the right side and 0 on  the left side.       Dorsalis pedis pulses are 2+ on the right side and 0 on the left side.       Posterior tibial pulses are 1+ on the right side and 0 on the left side.     Heart sounds: Normal heart sounds. No murmur heard.    No gallop.  Pulmonary:     Effort: Pulmonary effort is normal.     Breath sounds: Normal breath sounds.  Abdominal:     General: Bowel sounds are normal.     Palpations: Abdomen is soft.  Musculoskeletal:     Right lower leg: No edema.     Left lower leg: No edema.      ECHOCARDIOGRAM COMPLETE 05/12/2023  1. Left ventricular ejection fraction, by estimation, is 60 to 65%. The left ventricle has normal function. The left ventricle has no regional wall motion abnormalities. Left ventricular diastolic parameters are indeterminate. Elevated left atrial pressure. The E/e' is 19. 2. Right ventricular systolic function is normal. The right ventricular size is normal. 3. The mitral valve is normal in structure. No evidence of mitral valve regurgitation. No evidence of mitral stenosis. 4. The aortic valve is grossly normal. Unable to determine aortic valve morphology due to image quality. Aortic valve regurgitation is not visualized. No aortic stenosis is present. 5. The inferior vena cava is normal in size with greater than 50% respiratory variability, suggesting right atrial pressure of 3 mmHg.   CT angio chest 05/11/2023: 1. No evidence of pulmonary embolus. 2. Extensive mucous plugging of the bilateral lower lobes, likely due to aspiration. 3. Irregular solid nodule of the right lower lobe, unchanged when compared with the recent lung cancer screening CT and suspicious for primary lung malignancy. 4. Moderate coronary artery calcifications, aortic Atherosclerosis  Carotid artery duplex 03/28/2023: 1. Advanced atherosclerosis in the neck Positive for - short segment high-grade RADIOGRAPHIC STRING SIGN Stenosis at the Left Carotid Bifurcation. Left ICA remains  patent, with comparatively mild atherosclerosis at the distal bulb and in the visible left siphon. - High-grade stenosis of the proximal Right Subclavian Artery, numerically estimated at 80%.   2. Right ICA plaque with less than 50% stenosis.  3. Congenital diminutive right vertebral artery. Dominant  Left Vertebral Artery with no significant stenosis. Diminutive Basilar artery which might also be congenital (such as due to fetal PCAs) but is incompletely evaluated.  EKG:    EKG Interpretation Date/Time:  Wednesday Nov 22 2023 14:39:33 EDT Ventricular Rate:  65 PR Interval:  136 QRS Duration:  84 QT Interval:  440 QTC Calculation: 457 R Axis:   59  Text Interpretation: EKG 11/22/2023: Normal sinus rhythm at the rate of 65 beats provide, normal axis, poor R progression, cannot exclude anteroseptal infarct old.  Compared to 05/10/2023, QS complexes more pronounced.  However at this pattern was also seen on 10/04/2008 Confirmed by Nea Gittens, Jagadeesh ([id number]) on 11/22/2023 2:48:50 PM    Medications and allergies    Allergies  Allergen Reactions   Montelukast      Bad dreams     Current Outpatient Medications:    albuterol  (VENTOLIN  HFA) 108 (90 Base) MCG/ACT inhaler, TAKE 2 PUFFS BY MOUTH EVERY 6 HOURS AS NEEDED FOR WHEEZE OR SHORTNESS OF BREATH, Disp: 8.5 each, Rfl: 5   ASPIRIN  LOW DOSE 81 MG EC tablet, TAKE 1 TABLET BY MOUTH EVERY DAY, Disp: 90 tablet, Rfl: 3   atorvastatin  (LIPITOR) 20 MG tablet, Take 1 tablet (20 mg total) by mouth daily., Disp: 90 tablet, Rfl: 1   Azelastine  HCl 137 MCG/SPRAY SOLN, USE 1 SPRAY INTO EACH NOSTRIL TWICE A DAY, Disp: 30 mL, Rfl: 1   benzonatate  (TESSALON ) 200 MG capsule, Take 1 capsule by mouth three times daily as needed, Disp: 90 capsule, Rfl: 6   Budeson-Glycopyrrol-Formoterol  (BREZTRI  AEROSPHERE) 160-9-4.8 MCG/ACT AERO, Inhale 2 puffs into the lungs in the morning and at bedtime., Disp: 3 each, Rfl: 3   Cholecalciferol (VITAMIN D -3) 125 MCG (5000 UT) TABS,  Take 5,000 Units by mouth every evening., Disp: , Rfl:    clopidogrel  (PLAVIX ) 75 MG tablet, Take 1 tablet (75 mg total) by mouth daily., Disp: 90 tablet, Rfl: 3   cyclobenzaprine  (FLEXERIL ) 5 MG tablet, TAKE 1 TABLET BY MOUTH AT BEDTIME AS NEEDED FOR MUSCLE SPASM, Disp: 30 tablet, Rfl: 0   escitalopram  (LEXAPRO ) 20 MG tablet, Take 1 tablet by mouth once daily, Disp: 90 tablet, Rfl: 0   gabapentin  (NEURONTIN ) 600 MG tablet, Take 1 tablet (600 mg total) by mouth every 8 (eight) hours., Disp: 90 tablet, Rfl: 5   hydrOXYzine  (VISTARIL ) 25 MG capsule, TAKE 1 CAPSULE BY MOUTH AT BEDTIME AS NEEDED, Disp: 90 capsule, Rfl: 0   ipratropium-albuterol  (DUONEB) 0.5-2.5 (3) MG/3ML SOLN, USE 1 AMPULE IN NEBULIZER 4 TIMES DAILY AS NEEDED, Disp: 360 mL, Rfl: 0   isosorbide mononitrate (IMDUR) 30 MG 24 hr tablet, Take 1 tablet (30 mg total) by mouth every morning., Disp: 30 tablet, Rfl: 2   methocarbamol  (ROBAXIN ) 500 MG tablet, Take 1 tablet (500 mg total) by mouth at bedtime as needed for muscle spasms., Disp: 15 tablet, Rfl: 0   metoprolol  succinate (TOPROL -XL) 25 MG 24 hr tablet, Take 1 tablet (25 mg total) by mouth every evening. Take with or immediately following a meal., Disp: 30 tablet, Rfl: 2   nitroGLYCERIN (NITROSTAT) 0.4 MG SL tablet, Place 1 tablet (0.4 mg total) under the tongue every 5 (five) minutes as needed., Disp: 25 tablet, Rfl: 1   oxybutynin  (DITROPAN -XL) 10 MG 24 hr tablet, Take 1 tablet (10 mg total) by mouth daily., Disp: 90 tablet, Rfl: 3   OXYGEN , Inhale into the lungs. 3 liters at rest and 5 liters with any activity.-, Disp: , Rfl:    pantoprazole  (  PROTONIX ) 40 MG tablet, Take 1 tablet by mouth once daily, Disp: 30 tablet, Rfl: 3   Potassium 99 MG TABS, Take 99 mg by mouth daily., Disp: , Rfl:    Respiratory Therapy Supplies (FLUTTER) DEVI, Use as directed, Disp: 1 each, Rfl: 0   roflumilast  (DALIRESP ) 500 MCG TABS tablet, Take 1 tablet (500 mcg total) by mouth daily., Disp: 30 tablet,  Rfl: 11   sodium chloride  HYPERTONIC 3 % nebulizer solution, Take by nebulization 2 (two) times daily., Disp: 750 mL, Rfl: 12  Current Facility-Administered Medications:    Study - ARNASA [id number] - astegolimab 238 mg/1.7 mL or placebo SQ injection (PI-Ramaswamy), 476 mg, Subcutaneous, Q14 Days,    Study - ARNASA [id number] - astegolimab 238 mg/1.7 mL or placebo SQ injection (PI-Ramaswamy), 476 mg, Subcutaneous, Q14 Days, , 476 mg at 09/12/23 0930   Meds ordered this encounter  Medications   metoprolol  succinate (TOPROL -XL) 25 MG 24 hr tablet    Sig: Take 1 tablet (25 mg total) by mouth every evening. Take with or immediately following a meal.    Dispense:  30 tablet    Refill:  2   isosorbide mononitrate (IMDUR) 30 MG 24 hr tablet    Sig: Take 1 tablet (30 mg total) by mouth every morning.    Dispense:  30 tablet    Refill:  2   atorvastatin  (LIPITOR) 20 MG tablet    Sig: Take 1 tablet (20 mg total) by mouth daily.    Dispense:  90 tablet    Refill:  1   nitroGLYCERIN (NITROSTAT) 0.4 MG SL tablet    Sig: Place 1 tablet (0.4 mg total) under the tongue every 5 (five) minutes as needed.    Dispense:  25 tablet    Refill:  1     Medications Discontinued During This Encounter  Medication Reason   atorvastatin  (LIPITOR) 10 MG tablet Dose change     ASSESSMENT AND PLAN: .      ICD-10-CM   1. Stable angina pectoris (HCC)  I20.89 EKG 12-Lead    metoprolol  succinate (TOPROL -XL) 25 MG 24 hr tablet    isosorbide mononitrate (IMDUR) 30 MG 24 hr tablet    atorvastatin  (LIPITOR) 20 MG tablet    nitroGLYCERIN (NITROSTAT) 0.4 MG SL tablet    Cardiac Stress Test: Informed Consent Details: Physician/Practitioner Attestation; Transcribe to consent form and obtain patient signature    ECHOCARDIOGRAM COMPLETE    MYOCARDIAL PERFUSION IMAGING    2. Dyspnea on exertion  R06.09 ECHOCARDIOGRAM COMPLETE    3. Hypercholesteremia  E78.00 atorvastatin  (LIPITOR) 20 MG tablet    Lipid panel    4.  Claudication in peripheral vascular disease [facility])  I73.9 Cardiac Stress Test: Informed Consent Details: Physician/Practitioner Attestation; Transcribe to consent form and obtain patient signature    5. Stenosis of right subclavian artery (HCC)  I77.1       Assessment and Plan Assessment & Plan Stable angina   She experiences intermittent chest pain with heaviness during physical activity, worsening over the past year, indicative of stable angina due to coronary artery disease. Chest pain present in the precordium and is relieved with rest.  There has been worsening over the past 1 year and now she gets at least 2-3 episodes of anginal symptoms every week.  She also has musculoskeletal pain from chronic cough.  Risk factors for CAD include age,smoking and hyperlipidemia.  She already has established vascular disease and has had left carotid artery balloon angioplasty and has  known bilateral subclavian artery stenosis and occluded right subclavian artery with absent upper extremity pulses.  Continue aspirin  and clopidogrel  as per current regimen. Start metoprolol  succinate 25 mg once daily for cardiac protection and isosorbide mononitrate 30 mg once daily to improve chest pain symptoms. She was informed about potential headaches with isosorbide mononitrate, expected to resolve in 3-5 days, and advised to monitor for dizziness or hypotension. Prescribe sublingual nitroglycerin for acute chest pain relief, instructing her to use one tablet under the tongue if pain persists after 5-10 minutes of rest. If pain does not resolve after 2-3 tablets, she should call emergency services. Order a Engineer, mining stress test to evaluate for ischemia and an echocardiogram to assess cardiac function.  Subclavian artery stenosis   Right subclavian artery stenosis is causing weakness and numbness in the right arm, with a blood pressure discrepancy between arms. Conservative management is preferred unless symptoms  worsen significantly. Consider stenting if right arm symptoms become significantly disabling.  Hyperlipidemia   She has long-standing hyperlipidemia with current atorvastatin  therapy. The LDL cholesterol target is 01-02 mg/dL for optimal cardiovascular protection. Increase atorvastatin  dose from 10 mg to 20 mg daily. Instruct her to take two 10 mg tablets if 20 mg tablets are not available. Order a lipid profile in 6 weeks to assess response to the increased atorvastatin  dose.  Chronic obstructive pulmonary disease (COPD)   She has COPD with chronic respiratory symptoms, including shortness of breath and cough, potentially contributing to chest discomfort and exacerbated by physical activity. Continue the current COPD management regimen.  Chronic respiratory failure with hypoxia   She has chronic respiratory failure with hypoxia requiring continuous oxygen  therapy, with oxygen  requirements varying with activity level. Continue oxygen  therapy as per current regimen.  We will also evaluate her for pulmonary hypertension.   Signed,  Knox Perl, MD, [facility] 11/22/2023, 4:57 PM [facility] [address] [place], [place] [address] Phone: [phone]. Fax:  [phone]

## 2023-11-23 LAB — LIPID PANEL
Chol/HDL Ratio: 2.2 ratio (ref 0.0–4.4)
Cholesterol, Total: 151 mg/dL (ref 100–199)
HDL: 70 mg/dL (ref >39)
LDL Chol Calc (NIH): 65 mg/dL (ref 0–99)
Triglycerides: 86 mg/dL (ref 0–149)
VLDL Cholesterol Cal: 16 mg/dL (ref 5–40)

## 2023-11-24 ENCOUNTER — Encounter: Payer: Self-pay | Admitting: Cardiology

## 2023-11-24 NOTE — Progress Notes (Signed)
 Normal cholesterol, continue present medications

## 2023-11-28 ENCOUNTER — Encounter (HOSPITAL_COMMUNITY): Payer: Self-pay

## 2023-12-01 ENCOUNTER — Telehealth (HOSPITAL_COMMUNITY): Payer: Self-pay | Admitting: *Deleted

## 2023-12-01 ENCOUNTER — Other Ambulatory Visit: Payer: Self-pay | Admitting: Cardiology

## 2023-12-01 ENCOUNTER — Encounter: Payer: Self-pay | Admitting: Cardiology

## 2023-12-01 DIAGNOSIS — I2089 Other forms of angina pectoris: Secondary | ICD-10-CM

## 2023-12-01 NOTE — Telephone Encounter (Signed)
Left detailed instructions for MPI

## 2023-12-03 ENCOUNTER — Other Ambulatory Visit: Payer: Self-pay | Admitting: Internal Medicine

## 2023-12-03 DIAGNOSIS — S76012D Strain of muscle, fascia and tendon of left hip, subsequent encounter: Secondary | ICD-10-CM

## 2023-12-04 NOTE — Telephone Encounter (Signed)
 Last Fill: 10/24/2023  Next Visit: No follow-ups on file   Last Visit: 09/25/2023  Dx: Tear of left gluteus minimus tendon, subsequent encounter   Current Dose per office note on 09/25/2023: Flexeril  5 mg at night   Okay to refill Flexeril ?

## 2023-12-05 ENCOUNTER — Encounter (INDEPENDENT_AMBULATORY_CARE_PROVIDER_SITE_OTHER): Payer: Self-pay

## 2023-12-06 ENCOUNTER — Encounter: Admitting: *Deleted

## 2023-12-06 DIAGNOSIS — J441 Chronic obstructive pulmonary disease with (acute) exacerbation: Secondary | ICD-10-CM

## 2023-12-06 MED ORDER — STUDY - ARNASA GB43374 (OPEN-LABEL) - ASTEGOLIMAB 238 MG/1.7 ML SQ INJECTION (PI-RAMASWAMY)
476.0000 mg | INJECTION | SUBCUTANEOUS | Status: AC
  Administered 2023-12-06: 476 mg via SUBCUTANEOUS
  Filled 2023-12-06: qty 3.4

## 2023-12-06 NOTE — Research (Signed)
 Title: A phase III Open-Label extension study to evaluate the long-term safety of Astegolimab in patiens with Chronic Obstructive Pulmonary disease.   Dose and Duration of Treatment: Astegolimab is presented as a sterile, slightly brown-yellow solution. Each single-use, 2.25 mL pre-filled syringe contains 1.7 mL deliverable volume. Astegolimab drug product is formulated at 140 mg/mL astegolimab with 114 mM succinic acid, 200 mM L-arginine, 10 mM L-methionine, 0.06% (w/v) polysorbate 20, pH 5.7. Placebo for astegolimab is supplied in an identical pre-filled syringe configuration.   Protocol # [id number] Sponsor: [employer] [address] [place] Protocol Version: v3 dated 15Oct2024 IB: version 9.0 dated April 2024 ICF:  ICFs: Make sure that the subject is re-consented with the updated ICFs Main ICF: [facility] [facility] Approved Version 15 Jun 2023, Revised 28Nov2024 Mobile Nursing: [employer] Approved Version 26Oct2023, Revised 08Aug2024 Optional Home Study Treatment: IRB Approved Version 12 May 2022 Revised 23 Feb 2023 ADDENDUM 1: IRB Approved Version 24Jul2024 Revised 8Aug2024 Lab Manual: V2.1.0 dated 21Jun2024    Mechanism of action: Astegolimab (also known as [id number] or [id number]) is a fully human, IgG2 monoclonal antibody that binds with high affinity to the interleukin (IL)-33 (IL-33) receptor, ST2, thereby blocking the signaling of IL-33, an inflammatory cytokine of the IL-1 family and member of the "alarmin" class of molecules. Astegolimab binds with high affinity to the human and cynomolgus monkey receptor for IL-33, ST2, and blocks IL-33 binding, thus inhibiting association with the IL-1R accessory protein (AcP) co-receptor and formation of an activated receptor complex.   Key Inclusion Criteria: Able and willing to provide written informed consent and to comply with the study protocol. Ability to comply with the requirements of study protocol, according to  the investigators's best judgement. Completion of the 52-week treatment period in either [place] or Study [id number].  Key Exclusion Criteria: Significant non- compliance in the parent study, specifically defined as missing scheduled visits, per investigators judgement. Any new clinically significant pulmonary disease other than COPD since enrolling in the study. Unstable cardiac disease, myocardial infarction, or [place]  Heart Association Class III or IV heart failure since enrolling in the parent study.      Integrated Pharmacokinetic/Pharmacodynamic Analysis:  In Studies [id number], [id number], and [id number], exploratory biomarker analysis showed there were consistent decreases in blood eosinophil counts throughout the treatment period, potentially mediated by a direct effect of IL-33 on eosinophil progenitors. In [place], there was no significant difference in fractional exhaled nitric oxide (FeNO) levels (reflecting airway IL-4/IL-13 activity) between astegolimab-treated groups relative to placebo throughout the treatment period. These data suggest that astegolimab has only a limited effect on Type 2 inflammation in asthma. No data are applicable for Study [id number]. No data are available yet for Study [id number].  Special Warnings/Considerations: Administration of astegolimab, a protein therapeutic, may lead to the development of anti-astegolimab antibodies which could lead to AEs and/or decreased exposure. In non-clinical studies (see Section 4.2), ADA incidence has generally been low and there was no apparent ADA impact on PK and safety in these studies. To date, the immunogenicity rates observed with astegolimab in clinical studies have been relatively low as well (see Immunogenicity Section 5.6). Several clinical studies have been conducted in patients with asthma, atopic dermatitis, COPD (IIS Study [id number]) and COVID-19 severe pneumonia, which generally showed low incidence of ADAs (Table 31).  There has been no correlation between ADA status and clinical findings or increased incidence of AEs. Astegolimab is now being considered in a larger study for the treatment of  COPD. This patient population is typically considered to have a hyper-responsive immune system. Route of administration for this molecule will be [place], either Q2W or Q4W. These factors increase the risk of development of an immune response to astegolimab, specifically with repeat dosing. From the previous IIS Study [id number], incidence of ADAs was low in COPD patients; this remains to be confirmed in a larger study. To monitor ADA development in ongoing studies, serum samples will be collected from patients at protocol-defined intervals. Patients who test positive for antibodies and have clinical sequelae that are considered potentially related to an ADA response may also be asked to return for additional follow-up testing.  Drug Interaction Studies: No PK drug interaction studies have been conducted to date.  Serious Adverse Reactions Observed in Asthma Study: During the treatment period, a similar proportion of patients across all cohorts experienced at least one AE, regardless of causality (Table 22). In total, 77.2%, 70.9%, 72.2%, and 72.1% of patients reported at least 1 AE in the placebo, 70 mg, 210 mg, and 490 mg groups, respectively. The most common AE's (>5% in any treatment group) were asthma, nasopharyngitis, upper respiratory tract infection, headache, and injection site reaction (ISR). The most common drug-related AE was ISR, which was reported more frequently in the astegolimab treatment groups than in the placebo group (1 [0.8%] patient in the placebo group, 10 [7.9%] patients with 70 mg, 8 [6.3%] patients with 210 mg, 6 [4.9%] patients with 490 mg). All ISRs were non-serious and mild or moderate in severity. During the treatment period, 50 SAEs were reported in 37 (7.4%) patients. The number of patients reporting SAEs was  comparable across all cohorts (11 SAEs in 8 patients on placebo, 21 SAEs in 14 patients on 70 mg, 11 SAEs in 9 patients on 210 mg, and 7 SAEs in 6 patients on 490 mg). The most common SAE was asthma. One SAE of moderate livedo reticularis (70 mg) was considered a suspected unexpected serious adverse drug reaction (SUSAR) related to astegolimab and was reported two days after the second dose, leading to discontinuation of astegolimab. Two (0.4%) patients reported anaphylaxis and hypersensitivity reactions: 1 severe SAE of anaphylactic reaction (placebo), and 1 moderate hypersensitivity (490 mg) considered related to astegolimab. Three (0.6%) patients experienced a potential Major Adverse Cardiac Event (MACE) (1 patient each in the placebo, 70 mg, and 210 mg groups). None of the potential MACE were considered related to astegolimab. Overall, 233 (46.4%) patients reported events of infection. A comparable number of patients reported infection across all treatment groups (65 [51.2%] patients on placebo, 55 [43.3%] patients on 70 mg, 58 [46.0%] patients on 210 mg, and 55 [45.1%] patients on 490 mg). The most frequently reported infection (>=10% incidence) was nasopharyngitis (12.7%). One patient on astegolimab 210 mg and the partner of one patient on placebo became pregnant during the study. Both delivered normal/healthy babies. Two deaths, unrelated to study drug, were reported: one patient on 210 mg astegolimab died following an SAE of asthma; the other patient on 490 mg astegolimab had an unexplained death. There were no clinically meaningful changes in laboratory parameters, vital signs, or ECG results, other than the 10% decrease in mean blood eosinophil counts in the astegolimab-treated groups, with no safety concerns. Treatment-induced ADAs were comparable between the astegolimab groups and had no impact on safety. Overall, astegolimab was well tolerated at all doses used and had a safety profile consistent with  that observed in the previous astegolimab Phase I studies.  Serious Adverse Reactions Observed  in Previous COPD Study: In the completed IIS Study [id number], a total of 81 patients received at least one dose of astegolimab or placebo. The safety profile of astegolimab was similar to that of placebo. There were a total of 222 AEs reported in 62 patients. A total of 28 (72%) patients in the placebo arm and 34 (81%) patients in the astegolimab arm reported at least one AE. The most commonly reported AEs were headache followed by urinary tract infection and viral upper respiratory tract infection. A total of 39 SAEs were reported in 28 (35%) patients; 16 (41%) patients in the placebo group and 12 (29%) in the astegolimab group. The most commonly reported SAE was hospital admission for community acquired pneumonia. Four SAEs resulted in patient discontinuation from treatment (1 patient in the placebo group and 3 patients in the astegolimab group). One patient in the placebo group experienced an AESI of potential MACE (heart failure, unrelated to the study treatment). No anaphylaxis or pregnancies were reported. Two deaths, unrelated to study treatment, were reported: 1 patient in the placebo group died after hospital acquired pneumonia, and another patient in the placebo group died after pneumonia and type 2 respiratory failure.  Safety Data: Astegolimab has been generally well tolerated. There have been 49 patient deaths across all astegolimab studies (Section 5.5.2), none of which were considered related to astegolimab. A total of 144 subjects experienced a total of 224 SAEs across all astegolimab studies. Of these, an SAE livedo reticularis observed in 1 patient was considered related to astegolimab by the investigator (Section 5.5.3). AEs leading to withdrawal (Section 5.5.4) have generally occurred at a rate similar to what would be expected for clinical trials in the studies' respective indications.   [facility]  [place] Clinical Research Coordinator note :    This visit for Subject [id number] with DOB: August 08, 1955 on 06 Dec 2023 for the above protocol is Visit/Encounter # week 4 and is for purpose of research.    Subject expressed continued interest and consent in continuing as a study subject. Subject confirmed that there was no change in contact information (e.g. address, telephone, email). Subject thanked for participation in research and contribution to science.  In this visit week 4 the subject was evaluated by  Sub investigator Elspeth Hals.  This research coordinator has verified that the above investigator is up to date with his/her training logs. Subject was evaluated for heart rate of 56 and blood pressure of 140/60. Please see sub investigator note for further details.   The Subject was informed that the PI Dr. Bertrum Brodie continues to have oversight of the subject's visits and course  through relevant discussions, reviews and also specifically of this visit by routing of this note to the PI. All procedures and assessments were performed per above stated protocol. Subject tolerated injections well. Please refer to subjects paper source binder for further details of this visit.      Signed by Jerilynn Montenegro, [facility] Clinical Research Coordinator  [facility]  [place], 

## 2023-12-07 ENCOUNTER — Ambulatory Visit: Payer: Self-pay | Admitting: Cardiology

## 2023-12-07 ENCOUNTER — Ambulatory Visit (HOSPITAL_COMMUNITY): Attending: Cardiovascular Disease

## 2023-12-07 DIAGNOSIS — I2089 Other forms of angina pectoris: Secondary | ICD-10-CM | POA: Insufficient documentation

## 2023-12-07 DIAGNOSIS — I700 Atherosclerosis of aorta: Secondary | ICD-10-CM | POA: Diagnosis not present

## 2023-12-07 LAB — MYOCARDIAL PERFUSION IMAGING
LV dias vol: 72 mL (ref 46–106)
LV sys vol: 21 mL
Nuc Stress EF: 71 %
Peak HR: 69 {beats}/min
Rest HR: 52 {beats}/min
Rest Nuclear Isotope Dose: 9.9 mCi
SDS: 0
SRS: 3
SSS: 0
ST Depression (mm): 0 mm
Stress Nuclear Isotope Dose: 32.8 mCi
TID: 1

## 2023-12-07 MED ORDER — REGADENOSON 0.4 MG/5ML IV SOLN
0.4000 mg | Freq: Once | INTRAVENOUS | Status: AC
  Administered 2023-12-07: 0.4 mg via INTRAVENOUS

## 2023-12-07 MED ORDER — TECHNETIUM TC 99M TETROFOSMIN IV KIT
9.9000 | PACK | Freq: Once | INTRAVENOUS | Status: AC | PRN
  Administered 2023-12-07: 9.9 via INTRAVENOUS

## 2023-12-07 MED ORDER — TECHNETIUM TC 99M TETROFOSMIN IV KIT
32.8000 | PACK | Freq: Once | INTRAVENOUS | Status: AC | PRN
Start: 2023-12-07 — End: 2023-12-07
  Administered 2023-12-07: 32.8 via INTRAVENOUS

## 2023-12-07 MED ORDER — REGADENOSON 0.4 MG/5ML IV SOLN
INTRAVENOUS | Status: AC
  Filled 2023-12-07: qty 5

## 2023-12-07 NOTE — Progress Notes (Signed)
 Maria [name], DOB [birth year], was seen as subject in a clinical trial /Protocol # [id number].  I was asked to evaluate the patient because of bradycardia and slightly soft diastolic blood pressures.  Pulse rate was 53 and blood pressure 140/60.  O2 sats were 85% momentarily but corrected to 94% @ rest on O2. Cardiopulmonary symptoms are stable.  She states that she still has substernal pressure with housework..  Cardiology consultation was completed by Dr. Berry Bristol 11/22/2023.  He  prescribed metoprolol  (Toprol -XL) 25 mg daily , isosorbide   (Imdur ) 30 mg daily & as needed nitroglycerin  as prophylaxis.   She does state that she remains slightly tired and sleeps poorly.  She describes her sleep pattern as awakening 2 hours after going to sleep then staying awake for 3-4 hours.  She states that her husband has reported rarely she will exhibit apnea.  Pertinent physical findings include: She exhibits "silent lungs" of advanced COPD.  Heart sounds are distant. Pedal pulses are decreased to palpation. All physical findings NCS.  The bradycardia and slightly soft diastolic blood pressure are attributable to initiation of the beta-blocker and antianginal vasodilating medications and do not warrant protocol change at this time.  Cardiology evaluation will continue with a myocardial perfusion scan on May 22.                                                                                                                          Alinda Apley MD,SI

## 2023-12-07 NOTE — Progress Notes (Signed)
 Normal stress test and minor abnormality noted and will discuss on visit soon

## 2023-12-20 ENCOUNTER — Encounter

## 2023-12-20 MED ORDER — STUDY - ARNASA GB43374 (OPEN-LABEL) - ASTEGOLIMAB 238 MG/1.7 ML SQ INJECTION (PI-RAMASWAMY)
476.0000 mg | INJECTION | SUBCUTANEOUS | Status: DC
  Filled 2023-12-20: qty 3.4

## 2023-12-21 ENCOUNTER — Ambulatory Visit: Admitting: Physician Assistant

## 2023-12-21 DIAGNOSIS — H2513 Age-related nuclear cataract, bilateral: Secondary | ICD-10-CM | POA: Diagnosis not present

## 2023-12-21 DIAGNOSIS — N3946 Mixed incontinence: Secondary | ICD-10-CM | POA: Diagnosis not present

## 2023-12-21 DIAGNOSIS — H524 Presbyopia: Secondary | ICD-10-CM | POA: Diagnosis not present

## 2023-12-21 NOTE — Progress Notes (Signed)
 PTNS  Session # 18  Health & Social Factors: no change Caffeine: 4 Alcohol: 0 Daytime voids #per day: 3 Night-time voids #per night: 2 Urgency: strong Incontinence Episodes #per day: 3 Ankle used: right Treatment Setting: 2 Feeling/ Response: sensory Comments: Patient tolerated well.  Performed By: Poet Hineman, PA-C   Follow Up: 1 month

## 2023-12-22 ENCOUNTER — Other Ambulatory Visit: Payer: Self-pay | Admitting: *Deleted

## 2023-12-22 ENCOUNTER — Telehealth: Payer: Self-pay | Admitting: Internal Medicine

## 2023-12-22 ENCOUNTER — Encounter: Admitting: *Deleted

## 2023-12-22 DIAGNOSIS — J441 Chronic obstructive pulmonary disease with (acute) exacerbation: Secondary | ICD-10-CM

## 2023-12-22 MED ORDER — PREDNISONE 10 MG PO TABS: ORAL_TABLET | ORAL | 0 refills | Status: DC

## 2023-12-22 MED ORDER — AZITHROMYCIN 250 MG PO TABS: ORAL_TABLET | ORAL | 0 refills | Status: DC

## 2023-12-22 MED ORDER — PREDNISONE 10 MG PO TABS
ORAL_TABLET | ORAL | 0 refills | Status: DC
Start: 2023-12-22 — End: 2023-12-22

## 2023-12-22 MED ORDER — AZITHROMYCIN 250 MG PO TABS
ORAL_TABLET | ORAL | 0 refills | Status: DC
Start: 2023-12-22 — End: 2023-12-22

## 2023-12-22 MED ORDER — STUDY - ARNASA GB43374 (OPEN-LABEL) - ASTEGOLIMAB 238 MG/1.7 ML SQ INJECTION (PI-RAMASWAMY)
476.0000 mg | INJECTION | SUBCUTANEOUS | Status: DC
  Administered 2023-12-22: 476 mg via SUBCUTANEOUS
  Filled 2023-12-22: qty 3.4

## 2023-12-22 NOTE — Telephone Encounter (Addendum)
 I was able to successfully send rxs mentioned below to the preferred pharmacy electronically        Disp Refills Start End   azithromycin  (ZITHROMAX ) 250 MG tablet 6 tablet 0 12/22/2023 --   Sig: Azithromycin  500mg  day 1 and then 250mg  daily x next 4 days   Sent to pharmacy as: azithromycin  (ZITHROMAX ) 250 MG tablet   E-Prescribing Status: Receipt confirmed by pharmacy (12/22/2023  2:23 PM EDT)     Disp Refills Start End   predniSONE  (DELTASONE ) 10 MG tablet 11 tablet 0 12/22/2023 --   Sig: 40 mg x1 day, then 30 mg x1 day, then 20 mg x1 day, then 10 mg x1 day, and then 5 mg x1 day and stop   Sent to pharmacy as: predniSONE  (DELTASONE ) 10 MG tablet   E-Prescribing Status: Receipt confirmed by pharmacy (12/22/2023  2:23 PM EDT)

## 2023-12-22 NOTE — Telephone Encounter (Signed)
   S:  Alvie Ax = presented for research visit today.  Is open label extension of biologic injection to reduce COPD exacerbations.  However on the study visit she reported to the study coordinator Jerilynn Montenegro productive yellow cough x  days , increased  sob at night please advise.  Based on this I made remote diagnosis of COPD exacerbation  Plan - Go to the ER if worse - Okay to get investigational medical product today - Prednisone  5 days - Antibiotic azithromycin  5 days  HOSPITAL COMPTUER NOT LETTING ME PRESCRIPBE to [facility] at in [place] -d/w Dovie Gell in office   Allergies  Allergen Reactions   Montelukast      Bad dreams

## 2023-12-22 NOTE — Research (Signed)
 Title: A phase III Open-Label extension study to evaluate the long-term safety of Astegolimab in patiens with Chronic Obstructive Pulmonary disease.   Dose and Duration of Treatment: Astegolimab is presented as a sterile, slightly brown-yellow solution. Each single-use, 2.25 mL pre-filled syringe contains 1.7 mL deliverable volume. Astegolimab drug product is formulated at 140 mg/mL astegolimab with 114 mM succinic acid, 200 mM L-arginine, 10 mM L-methionine, 0.06% (w/v) polysorbate 20, pH 5.7. Placebo for astegolimab is supplied in an identical pre-filled syringe configuration.   Protocol # [id number] Sponsor: [employer] [address], [place]  Protocol Version: v3 dated 15Oct2024 IB: version 9.0 dated April 2024 ICF:  ICFs: Make sure that the subject is re-consented with the updated ICFs Main ICF: [facility] Approved Version 15 Jun 2023, Revised 28Nov2024 [facility]: [facility] Approved Version 26Oct2023, Revised 08Aug2024 Optional Home Study Treatment: IRB Approved Version 12 May 2022 Revised 23 Feb 2023 ADDENDUM 1: IRB Approved Version 24Jul2024 Revised 8Aug2024 Lab Manual: Lab Manual v2.2.0 dated 05Dec2024       Mechanism of action: Astegolimab (also known as [id number] or [id number]) is a fully human, IgG2 monoclonal antibody that binds with high affinity to the interleukin (IL)-33 (IL-33) receptor, ST2, thereby blocking the signaling of IL-33, an inflammatory cytokine of the IL-1 family and member of the "alarmin" class of molecules. Astegolimab binds with high affinity to the human and cynomolgus monkey receptor for IL-33, ST2, and blocks IL-33 binding, thus inhibiting association with the IL-1R accessory protein (AcP) co-receptor and formation of an activated receptor complex.   Key Inclusion Criteria: Able and willing to provide written informed consent and to comply with the study protocol. Ability to comply with the requirements of study  protocol, according to the investigators's best judgement. Completion of the 52-week treatment period in either [place] or Study [id number].  Key Exclusion Criteria: Significant non- compliance in the parent study, specifically defined as missing scheduled visits, per investigators judgement. Any new clinically significant pulmonary disease other than COPD since enrolling in the study. Unstable cardiac disease, myocardial infarction, or [place]  Heart Association Class III or IV heart failure since enrolling in the parent study.      Integrated Pharmacokinetic/Pharmacodynamic Analysis:  In Studies [id number], [id number], and [id number], exploratory biomarker analysis showed there were consistent decreases in blood eosinophil counts throughout the treatment period, potentially mediated by a direct effect of IL-33 on eosinophil progenitors. In [place], there was no significant difference in fractional exhaled nitric oxide (FeNO) levels (reflecting airway IL-4/IL-13 activity) between astegolimab-treated groups relative to placebo throughout the treatment period. These data suggest that astegolimab has only a limited effect on Type 2 inflammation in asthma. No data are applicable for Study [id number]. No data are available yet for Study [id number].  Special Warnings/Considerations: Administration of astegolimab, a protein therapeutic, may lead to the development of anti-astegolimab antibodies which could lead to AEs and/or decreased exposure. In non-clinical studies (see Section 4.2), ADA incidence has generally been low and there was no apparent ADA impact on PK and safety in these studies. To date, the immunogenicity rates observed with astegolimab in clinical studies have been relatively low as well (see Immunogenicity Section 5.6). Several clinical studies have been conducted in patients with asthma, atopic dermatitis, COPD (IIS Study [id number]) and COVID-19 severe pneumonia, which generally showed low  incidence of ADAs (Table 31). There has been no correlation between ADA status and clinical findings or increased incidence of AEs. Astegolimab is now being considered in a  larger study for the treatment of COPD. This patient population is typically considered to have a hyper-responsive immune system. Route of administration for this molecule will be Elim, either Q2W or Q4W. These factors increase the risk of development of an immune response to astegolimab, specifically with repeat dosing. From the previous IIS Study [id number], incidence of ADAs was low in COPD patients; this remains to be confirmed in a larger study. To monitor ADA development in ongoing studies, serum samples will be collected from patients at protocol-defined intervals. Patients who test positive for antibodies and have clinical sequelae that are considered potentially related to an ADA response may also be asked to return for additional follow-up testing.  Drug Interaction Studies: No PK drug interaction studies have been conducted to date.  Serious Adverse Reactions Observed in Asthma Study: During the treatment period, a similar proportion of patients across all cohorts experienced at least one AE, regardless of causality (Table 22). In total, 77.2%, 70.9%, 72.2%, and 72.1% of patients reported at least 1 AE in the placebo, 70 mg, 210 mg, and 490 mg groups, respectively. The most common AE's (>5% in any treatment group) were asthma, nasopharyngitis, upper respiratory tract infection, headache, and injection site reaction (ISR). The most common drug-related AE was ISR, which was reported more frequently in the astegolimab treatment groups than in the placebo group (1 [0.8%] patient in the placebo group, 10 [7.9%] patients with 70 mg, 8 [6.3%] patients with 210 mg, 6 [4.9%] patients with 490 mg). All ISRs were non-serious and mild or moderate in severity. During the treatment period, 50 SAEs were reported in 37 (7.4%) patients. The number of  patients reporting SAEs was comparable across all cohorts (11 SAEs in 8 patients on placebo, 21 SAEs in 14 patients on 70 mg, 11 SAEs in 9 patients on 210 mg, and 7 SAEs in 6 patients on 490 mg). The most common SAE was asthma. One SAE of moderate livedo reticularis (70 mg) was considered a suspected unexpected serious adverse drug reaction (SUSAR) related to astegolimab and was reported two days after the second dose, leading to discontinuation of astegolimab. Two (0.4%) patients reported anaphylaxis and hypersensitivity reactions: 1 severe SAE of anaphylactic reaction (placebo), and 1 moderate hypersensitivity (490 mg) considered related to astegolimab. Three (0.6%) patients experienced a potential Major Adverse Cardiac Event (MACE) (1 patient each in the placebo, 70 mg, and 210 mg groups). None of the potential MACE were considered related to astegolimab. Overall, 233 (46.4%) patients reported events of infection. A comparable number of patients reported infection across all treatment groups (65 [51.2%] patients on placebo, 55 [43.3%] patients on 70 mg, 58 [46.0%] patients on 210 mg, and 55 [45.1%] patients on 490 mg). The most frequently reported infection (>=10% incidence) was nasopharyngitis (12.7%). One patient on astegolimab 210 mg and the partner of one patient on placebo became pregnant during the study. Both delivered normal/healthy babies. Two deaths, unrelated to study drug, were reported: one patient on 210 mg astegolimab died following an SAE of asthma; the other patient on 490 mg astegolimab had an unexplained death. There were no clinically meaningful changes in laboratory parameters, vital signs, or ECG results, other than the 10% decrease in mean blood eosinophil counts in the astegolimab-treated groups, with no safety concerns. Treatment-induced ADAs were comparable between the astegolimab groups and had no impact on safety. Overall, astegolimab was well tolerated at all doses used and had a  safety profile consistent with that observed in the previous astegolimab Phase I  studies.  Serious Adverse Reactions Observed in Previous COPD Study: In the completed IIS Study [id number], a total of 81 patients received at least one dose of astegolimab or placebo. The safety profile of astegolimab was similar to that of placebo. There were a total of 222 AEs reported in 62 patients. A total of 28 (72%) patients in the placebo arm and 34 (81%) patients in the astegolimab arm reported at least one AE. The most commonly reported AEs were headache followed by urinary tract infection and viral upper respiratory tract infection. A total of 39 SAEs were reported in 28 (35%) patients; 16 (41%) patients in the placebo group and 12 (29%) in the astegolimab group. The most commonly reported SAE was hospital admission for community acquired pneumonia. Four SAEs resulted in patient discontinuation from treatment (1 patient in the placebo group and 3 patients in the astegolimab group). One patient in the placebo group experienced an AESI of potential MACE (heart failure, unrelated to the study treatment). No anaphylaxis or pregnancies were reported. Two deaths, unrelated to study treatment, were reported: 1 patient in the placebo group died after hospital acquired pneumonia, and another patient in the placebo group died after pneumonia and type 2 respiratory failure.  Safety Data: Astegolimab has been generally well tolerated. There have been 49 patient deaths across all astegolimab studies (Section 5.5.2), none of which were considered related to astegolimab. A total of 144 subjects experienced a total of 224 SAEs across all astegolimab studies. Of these, an SAE livedo reticularis observed in 1 patient was considered related to astegolimab by the investigator (Section 5.5.3). AEs leading to withdrawal (Section 5.5.4) have generally occurred at a rate similar to what would be expected for clinical trials in the studies'  respective indications.   [facility] Research Coordinator note :    This visit for Subject [id number] with DOB: May 04, 1956 on 22 Dec 2023 for the above protocol is Visit/Encounter # week 6 and is for purpose of research.    Subject expressed continued interest and consent in continuing as a study subject. Subject confirmed that there was no change in contact information (e.g. address, telephone, email). Subject thanked for participation in research and contribution to science.    The Subject was informed that the PI Dr. Bertrum Brodie continues to have oversight of the subject's visits and course  through relevant discussions, reviews and also specifically of this visit by routing of this note to the PI.    Pt presented for visit complaining of productive cough (yellow) x 5 days with increased sob at night. PI was informed and prescribed prednisone  taper and antibiotic. All procedures and assessments were performed per above stated protocol. Subject tolerated injections well. Please see subjects paper source binder for further details of this visit.   Signed by Dara Ear Clinical Research Coordinator  [facility]  [place], [place]

## 2023-12-27 ENCOUNTER — Encounter

## 2023-12-27 ENCOUNTER — Ambulatory Visit (HOSPITAL_COMMUNITY)
Admission: RE | Admit: 2023-12-27 | Discharge: 2023-12-27 | Disposition: A | Discharge status: Home / Self Care | Source: Ambulatory Visit | Attending: Cardiology | Admitting: Cardiology

## 2023-12-27 DIAGNOSIS — I2089 Other forms of angina pectoris: Secondary | ICD-10-CM | POA: Diagnosis not present

## 2023-12-27 DIAGNOSIS — R0609 Other forms of dyspnea: Secondary | ICD-10-CM | POA: Diagnosis not present

## 2023-12-27 LAB — ECHOCARDIOGRAM COMPLETE
Area-P 1/2: 3.85 cm2
S' Lateral: 2.4 cm

## 2023-12-28 NOTE — Progress Notes (Signed)
 Essentially normal echocardiogram.

## 2023-12-29 ENCOUNTER — Other Ambulatory Visit (INDEPENDENT_AMBULATORY_CARE_PROVIDER_SITE_OTHER): Payer: Self-pay | Admitting: Vascular Surgery

## 2023-12-29 DIAGNOSIS — I6522 Occlusion and stenosis of left carotid artery: Secondary | ICD-10-CM

## 2023-12-31 NOTE — Progress Notes (Unsigned)
 MRN : [id number]  Maria [name] is a 68 y.o. (1955-11-07) female who presents with chief complaint of check carotid arteries.  History of Present Illness:   The patient is seen for follow up evaluation of carotid stenosis status post left carotid angioplasty on 03/07/2023.     Procedure: Angioplasty of the left internal carotid artery to 5 mm with the use of a Nav 6 embolic protection device.   Attempted placement of a 6 x 8 x 40 exact stent with the use of the NAV-6 embolic protection device in the left carotid artery    There were no post operative complications related to the surgery.     The patient denies interval amaurosis fugax. There is no recent history of TIA symptoms or focal motor deficits. There is no prior documented CVA.  She is still noting some dizziness but this is manageable.   The patient denies headache.   The patient is taking Plavix  75 mg and enteric-coated aspirin  81 mg daily.   No recent shortening of the patient's walking distance or new symptoms consistent with claudication.  No history of rest pain symptoms. No new ulcers or wounds of the lower extremities have occurred.   There is no history of DVT, PE or superficial thrombophlebitis. No recent episodes of angina or shortness of breath documented.    Previous duplex ultrasound of the carotid arteries demonstrates RICA 40-59% and LICA 60-79% (post angioplasty)  No outpatient medications have been marked as taking for the 01/01/24 encounter (Appointment) with Prescilla Brod, Ninette Basque, MD.   Current Facility-Administered Medications for the 01/01/24 encounter (Appointment) with Prescilla Brod, Ninette Basque, MD  Medication   Study - ARNASA [phone] (open-label) - astegolimab 238 mg/1.7 mL SQ injection (PI-Ramaswamy)   Study - ARNASA [id number] - astegolimab 238 mg/1.7 mL or placebo SQ injection (PI-Ramaswamy)   Study - ARNASA [id number] - astegolimab 238 mg/1.7 mL or placebo SQ injection (PI-Ramaswamy)     Past Medical History:  Diagnosis Date   Atherosclerosis 1/0612014   aorta, iliacs and CFA bilaterally no greater than 0-49% - lower arterial doppler.   Blood transfusion without reported diagnosis 2005   Cancer [facility]) Ovarian 1978   Chest pain    COPD (chronic obstructive pulmonary disease) (HCC)    COVID-19 long hauler manifesting chronic dyspnea    Emphysema of lung (HCC) 2019   Oxygen  deficiency 03/2019   Oxygen  dependent    3 liters up to 5    Past Surgical History:  Procedure Laterality Date   ABDOMINAL HYSTERECTOMY  1978   BREAST BIOPSY Left 04/04/2023   Stereo bx, X clip, path pending   BREAST BIOPSY Left 04/04/2023   MM LT BREAST BX W LOC DEV 1ST LESION IMAGE BX SPEC STEREO GUIDE 04/04/2023 [facility]   CAROTID PTA/STENT INTERVENTION Left 03/07/2023   Procedure: CAROTID PTA/STENT INTERVENTION;  Surgeon: Jackquelyn Mass, MD;  Location: [facility] INVASIVE CV LAB;  Service: Cardiovascular;  Laterality: Left;   CHOLECYSTECTOMY  2010   COLONOSCOPY     COLONOSCOPY     COLONOSCOPY WITH PROPOFOL  N/A 11/01/2021   Procedure: COLONOSCOPY WITH PROPOFOL ;  Surgeon: Luke Salaam, MD;  Location: [facility] ENDOSCOPY;  Service: Gastroenterology;  Laterality: N/A;   ESOPHAGOGASTRODUODENOSCOPY     NECK SURGERY  1999   OVARIAN CYST REMOVAL  1988   SPINE SURGERY  Neck 2002    Social  History Social History   Tobacco Use   Smoking status: Former    Current packs/day: 0.00    Average packs/day: 1 pack/day for 45.0 years (45.0 ttl pk-yrs)    Types: Cigarettes    Start date: 03/19/1971    Quit date: 03/18/2016    Years since quitting: 7.7    Passive exposure: Past   Smokeless tobacco: Never  Vaping Use   Vaping status: Never Used  Substance Use Topics   Alcohol use: No    Alcohol/week: 0.0 standard drinks of alcohol   Drug use: No    Family History Family History  Problem Relation Age of Onset   Heart failure Mother    Diabetes Mother    Kidney disease Mother    Hypertension  Mother    Arthritis Mother    Heart disease Mother    Obesity Mother    COPD Father    Hearing loss Father    Lung cancer Brother    COPD Brother    COPD Brother    Asthma Son    Diabetes Son    Birth defects Son    Breast cancer Maternal Aunt 40    Allergies  Allergen Reactions   Montelukast      Bad dreams     REVIEW OF SYSTEMS (Negative unless checked)  Constitutional: [] Weight loss  [] Fever  [] Chills Cardiac: [] Chest pain   [] Chest pressure   [] Palpitations   [] Shortness of breath when laying flat   [] Shortness of breath with exertion. Vascular:  [x] Pain in legs with walking   [] Pain in legs at rest  [] History of DVT   [] Phlebitis   [] Swelling in legs   [] Varicose veins   [] Non-healing ulcers Pulmonary:   [] Uses home oxygen    [] Productive cough   [] Hemoptysis   [] Wheeze  [x] COPD   [] Asthma Neurologic:  [] Dizziness   [] Seizures   [] History of stroke   [] History of TIA  [] Aphasia   [] Vissual changes   [] Weakness or numbness in arm   [] Weakness or numbness in leg Musculoskeletal:   [] Joint swelling   [x] Joint pain   [] Low back pain Hematologic:  [] Easy bruising  [] Easy bleeding   [] Hypercoagulable state   [] Anemic Gastrointestinal:  [] Diarrhea   [] Vomiting  [x] Gastroesophageal reflux/heartburn   [] Difficulty swallowing. Genitourinary:  [] Chronic kidney disease   [] Difficult urination  [] Frequent urination   [] Blood in urine Skin:  [] Rashes   [] Ulcers  Psychological:  [] History of anxiety   []  History of major depression.  Physical Examination  There were no vitals filed for this visit. There is no height or weight on file to calculate BMI. Gen: WD/WN, NAD Head: [place]/AT, No temporalis wasting.  Ear/Nose/Throat: Hearing grossly intact, nares w/o erythema or drainage Eyes: PER, EOMI, sclera nonicteric.  Neck: Supple, no masses.  No bruit or JVD.  Pulmonary:  Good air movement, no audible wheezing, no use of accessory muscles.  Cardiac: RRR, normal S1, S2, no  Murmurs. Vascular:  carotid bruit noted Vessel Right Left  Radial Palpable Palpable  Carotid  Palpable  Palpable  Subclav  Palpable Palpable  Gastrointestinal: soft, non-distended. No guarding/no peritoneal signs.  Musculoskeletal: M/S 5/5 throughout.  No visible deformity.  Neurologic: CN 2-12 intact. Pain and light touch intact in extremities.  Symmetrical.  Speech is fluent. Motor exam as listed above. Psychiatric: Judgment intact, Mood & affect appropriate for pt's clinical situation. Dermatologic: No rashes or ulcers noted.  No changes consistent with cellulitis.   CBC Lab Results  Component Value  Date   WBC 15.2 (H) 05/12/2023   HGB 10.6 (L) 05/12/2023   HCT 32.2 (L) 05/12/2023   MCV 96.1 05/12/2023   PLT 256 05/12/2023    BMET    Component Value Date/Time   NA 135 05/12/2023 0627   K 3.9 05/12/2023 0627   CL 101 05/12/2023 0627   CO2 24 05/12/2023 0627   GLUCOSE 95 05/12/2023 0627   BUN 17 05/12/2023 0627   CREATININE 0.63 05/12/2023 0627   CALCIUM  9.0 05/12/2023 0627   GFRNONAA >60 05/12/2023 0627   GFRAA >60 03/03/2019 0154   CrCl cannot be calculated (Patient's most recent lab result is older than the maximum 21 days allowed.).  COAG Lab Results  Component Value Date   INR 1.0 10/03/2008   INR 1.0 10/02/2008    Radiology ECHOCARDIOGRAM COMPLETE Result Date: 12/27/2023    ECHOCARDIOGRAM REPORT   Patient Name:   Maria [name] Date of Exam: 12/27/2023 Medical Rec #:  [id number]      Height:       67.0 in Accession #:    [id number]     Weight:       161.6 lb Date of Birth:  1955-08-11       BSA:          1.847 m Patient Age:    67 years       BP:           134/65 mmHg Patient Gender: F              HR:           60 bpm. Exam Location:  [address] Procedure: 2D Echo, Cardiac Doppler, Color Doppler, 3D Echo and Strain Analysis            (Both Spectral and Color Flow Doppler were utilized during            procedure). Indications:    R06.00 SOB  History:         Patient has prior history of Echocardiogram examinations, most                 recent 05/12/2023. COPD, Signs/Symptoms:Shortness of Breath;                 Risk Factors:Former Smoker, Hypertension and Dyslipidemia.  Sonographer:    Lula Sale RDCS Referring Phys: [id number] IMPRESSIONS  1. Left ventricular ejection fraction, by estimation, is 60 to 65%. Left ventricular ejection fraction by 3D volume is 61 %. The left ventricle has normal function. The left ventricle has no regional wall motion abnormalities. Left ventricular diastolic  parameters were normal. The average left ventricular global longitudinal strain is -26.1 %. The global longitudinal strain is normal.  2. Right ventricular systolic function is normal. The right ventricular size is normal.  3. The mitral valve is normal in structure. No evidence of mitral valve regurgitation. No evidence of mitral stenosis.  4. The aortic valve is normal in structure. Aortic valve regurgitation is not visualized. No aortic stenosis is present.  5. The inferior vena cava is normal in size with greater than 50% respiratory variability, suggesting right atrial pressure of 3 mmHg. FINDINGS  Left Ventricle: Left ventricular ejection fraction, by estimation, is 60 to 65%. Left ventricular ejection fraction by 3D volume is 61 %. The left ventricle has normal function. The left ventricle has no regional wall motion abnormalities. The average left ventricular global longitudinal strain is -26.1 %. Strain was performed and  the global longitudinal strain is normal. The left ventricular internal cavity size was normal in size. There is no left ventricular hypertrophy. Left ventricular diastolic parameters were normal. Right Ventricle: The right ventricular size is normal. No increase in right ventricular wall thickness. Right ventricular systolic function is normal. Left Atrium: Left atrial size was normal in size. Right Atrium: Right atrial size was normal in size.  Pericardium: There is no evidence of pericardial effusion. Mitral Valve: The mitral valve is normal in structure. No evidence of mitral valve regurgitation. No evidence of mitral valve stenosis. Tricuspid Valve: The tricuspid valve is normal in structure. Tricuspid valve regurgitation is trivial. No evidence of tricuspid stenosis. Aortic Valve: The aortic valve is normal in structure. Aortic valve regurgitation is not visualized. No aortic stenosis is present. Pulmonic Valve: The pulmonic valve was normal in structure. Pulmonic valve regurgitation is not visualized. No evidence of pulmonic stenosis. Aorta: The aortic root is normal in size and structure. Venous: The inferior vena cava is normal in size with greater than 50% respiratory variability, suggesting right atrial pressure of 3 mmHg. IAS/Shunts: No atrial level shunt detected by color flow Doppler. Additional Comments: 3D was performed not requiring image post processing on an independent workstation and was normal.  LEFT VENTRICLE PLAX 2D LVIDd:         3.70 cm         Diastology LVIDs:         2.40 cm         LV e' medial:    8.16 cm/s LV PW:         0.80 cm         LV E/e' medial:  13.6 LV IVS:        1.00 cm         LV e' lateral:   8.38 cm/s LVOT diam:     2.00 cm         LV E/e' lateral: 13.2 LV SV:         78 LV SV Index:   42              2D Longitudinal LVOT Area:     3.14 cm        Strain                                2D Strain GLS   -26.7 %                                (A4C):                                2D Strain GLS   -23.5 %                                (A3C):                                2D Strain GLS   -28.1 %                                (A2C):  2D Strain GLS   -26.1 %                                Avg:                                 3D Volume EF                                LV 3D EF:    Left                                             ventricul                                             ar                                              ejection                                             fraction                                             by 3D                                             volume is                                             61 %.                                 3D Volume EF:                                3D EF:        61 %                                LV EDV:       111 ml                                LV ESV:       43 ml  LV SV:        68 ml RIGHT VENTRICLE             IVC RV S prime:     12.00 cm/s  IVC diam: 1.10 cm TAPSE (M-mode): 2.7 cm RVSP:           24.2 mmHg LEFT ATRIUM             Index        RIGHT ATRIUM           Index LA diam:        2.60 cm 1.41 cm/m   RA Pressure: 3.00 mmHg LA Vol (A2C):   32.8 ml 17.76 ml/m  RA Area:     8.09 cm LA Vol (A4C):   46.2 ml 25.01 ml/m  RA Volume:   16.90 ml  9.15 ml/m LA Biplane Vol: 38.3 ml 20.74 ml/m  AORTIC VALVE LVOT Vmax:   99.00 cm/s LVOT Vmean:  64.200 cm/s LVOT VTI:    0.249 m  AORTA Ao Root diam: 3.20 cm Ao Asc diam:  3.00 cm MITRAL VALVE                TRICUSPID VALVE MV Area (PHT): 3.85 cm     TR Peak grad:   21.2 mmHg MV Decel Time: 197 msec     TR Vmax:        230.00 cm/s MV E velocity: 111.00 cm/s  Estimated RAP:  3.00 mmHg MV A velocity: 98.50 cm/s   RVSP:           24.2 mmHg MV E/A ratio:  1.13                             SHUNTS                             Systemic VTI:  0.25 m                             Systemic Diam: 2.00 cm Arta Lark Electronically signed by Arta Lark Signature Date/Time: 12/27/2023/12:55:13 PM    Final    MYOCARDIAL PERFUSION/CT RAD READ Result Date: 12/11/2023 CLINICAL DATA:  This over-read does not include interpretation of cardiac or coronary anatomy or pathology. The cardiac SPECT CT interpretation by the cardiologist is attached. COMPARISON:  PET-CT June 26, 2023 and CTA chest May 11, 2023 FINDINGS: Vascular: Aortic atherosclerosis.  Mediastinum/Nodes: No pathologically enlarged mediastinal lymph nodes. Lungs/Pleura: Emphysema. Hypoventilatory change in the dependent lungs. Previously described irregular right lower lobe pulmonary nodule previously evaluated by PET-CT and not hypermetabolic on that examination is not well seen on today study. Upper Abdomen: Gallbladder surgically absent. Musculoskeletal: Multilevel degenerative change of the spine. IMPRESSION: Previously described irregular right lower lobe pulmonary nodule previously evaluated by PET-CT and not hypermetabolic on that examination is not well seen on today study. Aortic Atherosclerosis (ICD10-I70.0) and Emphysema (ICD10-J43.9). Electronically Signed   By: Tama Fails M.D.   On: 12/11/2023 12:39   MYOCARDIAL PERFUSION IMAGING Result Date: 12/07/2023   Findings are consistent with no ischemia. The study is low risk.   No ST deviation was noted. The ECG was not diagnostic due to pharmacologic protocol.   LV perfusion is normal.   Left ventricular function is normal. Nuclear stress EF: 71%. The left ventricular ejection fraction is  hyperdynamic (>65%). End diastolic cavity size is normal. End systolic cavity size is normal.   CT images were obtained for attenuation correction and were examined for the presence of coronary calcium  when appropriate.   Coronary calcium  was present on the attenuation correction CT images. Mild coronary calcifications were present. Aortic atherosclerosis was noted. Coronary calcifications were present in the left anterior descending artery distribution(s).   Prior study available for comparison from 10/04/2008. No changes compared to prior study.   ECG rhythm shows normal sinus rhythm. No reversible ischemia. LVEF 71% with normal wall motion. Mild LAD calcification and aortic atherosclerosis was noted. This is a low risk study. No change compared to prior study in 2010.     Assessment/Plan There are no diagnoses linked to this  encounter.   Devon Fogo, MD  12/31/2023 3:29 PM

## 2024-01-01 ENCOUNTER — Encounter (INDEPENDENT_AMBULATORY_CARE_PROVIDER_SITE_OTHER): Payer: Self-pay | Admitting: Vascular Surgery

## 2024-01-01 ENCOUNTER — Ambulatory Visit (INDEPENDENT_AMBULATORY_CARE_PROVIDER_SITE_OTHER): Payer: Medicare HMO | Admitting: Vascular Surgery

## 2024-01-01 ENCOUNTER — Ambulatory Visit (INDEPENDENT_AMBULATORY_CARE_PROVIDER_SITE_OTHER): Payer: Medicare HMO

## 2024-01-01 VITALS — BP 135/65 | HR 73 | Resp 18 | Wt 163.2 lb

## 2024-01-01 DIAGNOSIS — E782 Mixed hyperlipidemia: Secondary | ICD-10-CM | POA: Diagnosis not present

## 2024-01-01 DIAGNOSIS — I6522 Occlusion and stenosis of left carotid artery: Secondary | ICD-10-CM

## 2024-01-01 DIAGNOSIS — K219 Gastro-esophageal reflux disease without esophagitis: Secondary | ICD-10-CM

## 2024-01-01 DIAGNOSIS — I70213 Atherosclerosis of native arteries of extremities with intermittent claudication, bilateral legs: Secondary | ICD-10-CM

## 2024-01-01 DIAGNOSIS — J449 Chronic obstructive pulmonary disease, unspecified: Secondary | ICD-10-CM

## 2024-01-01 DIAGNOSIS — I6523 Occlusion and stenosis of bilateral carotid arteries: Secondary | ICD-10-CM

## 2024-01-03 ENCOUNTER — Other Ambulatory Visit: Payer: Self-pay | Admitting: Nurse Practitioner

## 2024-01-03 ENCOUNTER — Encounter: Admitting: Internal Medicine

## 2024-01-03 ENCOUNTER — Telehealth: Payer: Self-pay | Admitting: Internal Medicine

## 2024-01-03 ENCOUNTER — Encounter

## 2024-01-03 DIAGNOSIS — J441 Chronic obstructive pulmonary disease with (acute) exacerbation: Secondary | ICD-10-CM

## 2024-01-03 DIAGNOSIS — Z006 Encounter for examination for normal comparison and control in clinical research program: Secondary | ICD-10-CM

## 2024-01-03 DIAGNOSIS — G479 Sleep disorder, unspecified: Secondary | ICD-10-CM

## 2024-01-03 MED ORDER — STUDY - ARNASA GB43374 (OPEN-LABEL) - ASTEGOLIMAB 238 MG/1.7 ML SQ INJECTION (PI-RAMASWAMY)
476.0000 mg | INJECTION | SUBCUTANEOUS | Status: AC
  Administered 2024-01-03: 476 mg via SUBCUTANEOUS
  Filled 2024-01-03: qty 3.4

## 2024-01-03 NOTE — Telephone Encounter (Signed)
 DEvki (cc Gurney Lefort)   Maria [name] has advanced copd. She is on a study drug for recurre AECOPD. Symptmatic. AEcopd frequent. Like to get started on Othuvayre please  Thanks    SIGNATURE    Dr. Maire Scot, M.D., F.C.C.P,  Pulmonary and Critical Care Medicine Staff Physician, [facility] Center Director - Interstitial Lung Disease  Program  [facility] [place], [place], [address]   Pager: [phone], If no answer  -> Check AMION or Try [phone] Telephone (clinical office): [phone] Telephone (research): [phone]  3:42 PM 01/03/2024       Latest Ref Rng & Units 08/29/2022    8:29 AM 11/12/2020   10:43 AM 01/16/2020   10:57 AM 06/24/2019    8:45 AM  PFT Results  FVC-Pre L 2.79  2.94  3.17  2.93   FVC-Predicted Pre % 83  84  90  83   FVC-Post L  3.18  3.25  3.39   FVC-Predicted Post %  91  92  96   Pre FEV1/FVC % % 57  54  55  56   Post FEV1/FCV % %  54  56  59   FEV1-Pre L 1.60  1.60  1.75  1.64   FEV1-Predicted Pre % 62  59  65  60   FEV1-Post L  1.71  1.83  1.99   DLCO uncorrected ml/min/mmHg 9.85  8.06  10.81  12.24   DLCO UNC% % 47  37  50  57   DLCO corrected ml/min/mmHg 9.85  8.06  11.29    DLCO COR %Predicted % 47  37  52    DLVA Predicted % 50  57  61  60   TLC L  4.89  5.25  5.81   TLC % Predicted %  91  98  108   RV % Predicted %  88  86  110

## 2024-01-04 ENCOUNTER — Encounter (INDEPENDENT_AMBULATORY_CARE_PROVIDER_SITE_OTHER): Payer: Self-pay | Admitting: Vascular Surgery

## 2024-01-07 ENCOUNTER — Other Ambulatory Visit: Payer: Self-pay | Admitting: Internal Medicine

## 2024-01-07 DIAGNOSIS — S76012D Strain of muscle, fascia and tendon of left hip, subsequent encounter: Secondary | ICD-10-CM

## 2024-01-08 ENCOUNTER — Ambulatory Visit: Attending: Surgery | Admitting: Surgery

## 2024-01-08 ENCOUNTER — Encounter: Payer: Self-pay | Admitting: Surgery

## 2024-01-08 ENCOUNTER — Other Ambulatory Visit: Payer: Self-pay

## 2024-01-08 VITALS — BP 144/68 | HR 63 | Temp 97.8°F | Ht 67.0 in | Wt 165.0 lb

## 2024-01-08 DIAGNOSIS — I6523 Occlusion and stenosis of bilateral carotid arteries: Secondary | ICD-10-CM

## 2024-01-08 NOTE — H&P (View-Only) (Signed)
 [facility]  Patient name: Maria [name] MRN: [id number] DOB: January 12, 1956 Sex: female   REQUESTING PROVIDER:   Dr. Jama   REASON FOR CONSULT:    Left carotid stenosis  HISTORY OF PRESENT ILLNESS:   Maria [name] is a 68 y.o. female, who is referred by Dr. Jama for us  for evaluation of left carotid stenosis.  The patient has a history of an attempt at left carotid stenting on 03/07/2023.  The lesion could not be crossed.  Because of her pulmonary comorbidities, she is not a good candidate for general anesthesia and so she is being sent over for TCAR evaluation.  She does complain of some dizziness.  She also notes numbness in her right arm and right leg.  She is on dual antiplatelet therapy with aspirin  and Plavix .  She is taking a statin.  The patient suffers from COPD with oxygen  dependence (5 L).  She recently saw pulmonary, Dr. Geronimo.  She is followed by Dr. Ladona for her cardiac disease  PAST MEDICAL HISTORY    Past Medical History:  Diagnosis Date   Atherosclerosis 1/0612014   aorta, iliacs and CFA bilaterally no greater than 0-49% - lower arterial doppler.   Blood transfusion without reported diagnosis 2005   Cancer [facility]) Ovarian 1978   Chest pain    COPD (chronic obstructive pulmonary disease) (HCC)    COVID-19 long hauler manifesting chronic dyspnea    Emphysema of lung (HCC) 2019   Oxygen  deficiency 03/2019   Oxygen  dependent    3 liters up to 5     FAMILY HISTORY   Family History  Problem Relation Age of Onset   Heart failure Mother    Diabetes Mother    Kidney disease Mother    Hypertension Mother    Arthritis Mother    Heart disease Mother    Obesity Mother    COPD Father    Hearing loss Father    Lung cancer Brother    COPD Brother    COPD Brother    Asthma Son    Diabetes Son    Birth defects Son    Breast cancer Maternal Aunt 40    SOCIAL HISTORY:   Social History    Socioeconomic History   Marital status: Married    Spouse name: Evalene Manufacturing systems engineer)   Number of children: 3   Years of education: Some college   Highest education level: GED or equivalent  Occupational History   Not on file  Tobacco Use   Smoking status: Former    Current packs/day: 0.00    Average packs/day: 1 pack/day for 45.0 years (45.0 ttl pk-yrs)    Types: Cigarettes    Start date: 03/19/1971    Quit date: 03/18/2016    Years since quitting: 7.8    Passive exposure: Past   Smokeless tobacco: Never  Vaping Use   Vaping status: Never Used  Substance and Sexual Activity   Alcohol use: No    Alcohol/week: 0.0 standard drinks of alcohol   Drug use: No   Sexual activity: Not Currently  Other Topics Concern   Not on file  Social History Narrative   08/27/19   From: the area (but [place] area)   Living: with husband [name] - since 1987   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      Family: Daughter Mckinley [place] nearby, son Adriana in [place], and other daughter - 1 hour; 5 grandchildren  Enjoys: crochet, relax at home      Exercise: on hold but has been doing pulmonary rehab   Diet: not currently, but has lost weight      Safety   Seat belts: Yes    Guns: Yes  and secure   Safe in relationships: Yes    Social Drivers of Health   Financial Resource Strain: Low Risk  (06/05/2023)   Overall Financial Resource Strain (CARDIA)    Difficulty of Paying Living Expenses: Not very hard  Food Insecurity: No Food Insecurity (06/05/2023)   Hunger Vital Sign    Worried About Running Out of Food in the Last Year: Never true    Ran Out of Food in the Last Year: Never true  Transportation Needs: No Transportation Needs (06/05/2023)   PRAPARE - Administrator, Civil Service (Medical): No    Lack of Transportation (Non-Medical): No  Physical Activity: Inactive (06/05/2023)   Exercise Vital Sign    Days of Exercise per Week: 0 days    Minutes of Exercise per  Session: 0 min  Stress: Stress Concern Present (06/05/2023)   [employer] of Occupational Health - Occupational Stress Questionnaire    Feeling of Stress : Rather much  Social Connections: Socially Integrated (06/05/2023)   Social Connection and Isolation Panel    Frequency of Communication with Friends and Family: More than three times a week    Frequency of Social Gatherings with Friends and Family: Once a week    Attends Religious Services: More than 4 times per year    Active Member of [employer] or Organizations: Yes    Attends Engineer, structural: More than 4 times per year    Marital Status: Married  Catering manager Violence: Not At Risk (05/10/2023)   Humiliation, Afraid, Rape, and Kick questionnaire    Fear of Current or Ex-Partner: No    Emotionally Abused: No    Physically Abused: No    Sexually Abused: No    ALLERGIES:    Allergies  Allergen Reactions   Montelukast      Bad dreams    CURRENT MEDICATIONS:    Current Outpatient Medications  Medication Sig Dispense Refill   albuterol  (VENTOLIN  HFA) 108 (90 Base) MCG/ACT inhaler TAKE 2 PUFFS BY MOUTH EVERY 6 HOURS AS NEEDED FOR WHEEZE OR SHORTNESS OF BREATH 8.5 each 5   ASPIRIN  LOW DOSE 81 MG EC tablet TAKE 1 TABLET BY MOUTH EVERY DAY 90 tablet 3   atorvastatin  (LIPITOR) 20 MG tablet Take 1 tablet (20 mg total) by mouth daily. 90 tablet 1   Azelastine  HCl 137 MCG/SPRAY SOLN USE 1 SPRAY INTO EACH NOSTRIL TWICE A DAY 30 mL 1   azithromycin  (ZITHROMAX ) 250 MG tablet Azithromycin  500mg  day 1 and then 250mg  daily x next 4 days 6 tablet 0   benzonatate  (TESSALON ) 200 MG capsule Take 1 capsule by mouth three times daily as needed 90 capsule 0   Budeson-Glycopyrrol-Formoterol  (BREZTRI  AEROSPHERE) 160-9-4.8 MCG/ACT AERO Inhale 2 puffs into the lungs in the morning and at bedtime. 3 each 3   Cholecalciferol (VITAMIN D -3) 125 MCG (5000 UT) TABS Take 5,000 Units by mouth every evening.     clopidogrel  (PLAVIX ) 75 MG  tablet Take 1 tablet (75 mg total) by mouth daily. 90 tablet 3   cyclobenzaprine  (FLEXERIL ) 5 MG tablet TAKE 1 TABLET BY MOUTH AT BEDTIME AS NEEDED FOR MUSCLE SPASM 30 tablet 0   escitalopram  (LEXAPRO ) 20 MG tablet Take 1 tablet by mouth  once daily 90 tablet 0   gabapentin  (NEURONTIN ) 600 MG tablet Take 1 tablet (600 mg total) by mouth every 8 (eight) hours. 90 tablet 5   hydrOXYzine  (VISTARIL ) 25 MG capsule TAKE 1 CAPSULE BY MOUTH AT BEDTIME AS NEEDED 90 capsule 0   ipratropium-albuterol  (DUONEB) 0.5-2.5 (3) MG/3ML SOLN USE 1 AMPULE IN NEBULIZER 4 TIMES DAILY AS NEEDED 360 mL 0   isosorbide  mononitrate (IMDUR ) 30 MG 24 hr tablet Take 1 tablet (30 mg total) by mouth every morning. 30 tablet 2   methocarbamol  (ROBAXIN ) 500 MG tablet Take 1 tablet (500 mg total) by mouth at bedtime as needed for muscle spasms. 15 tablet 0   metoprolol  succinate (TOPROL -XL) 25 MG 24 hr tablet Take 1 tablet (25 mg total) by mouth every evening. Take with or immediately following a meal. 30 tablet 2   nitroGLYCERIN  (NITROSTAT ) 0.4 MG SL tablet Place 1 tablet (0.4 mg total) under the tongue every 5 (five) minutes as needed. 25 tablet 1   oxybutynin  (DITROPAN -XL) 10 MG 24 hr tablet Take 1 tablet (10 mg total) by mouth daily. 90 tablet 3   OXYGEN  Inhale into the lungs. 3 liters at rest and 5 liters with any activity.-     pantoprazole  (PROTONIX ) 40 MG tablet Take 1 tablet by mouth once daily 30 tablet 3   Potassium 99 MG TABS Take 99 mg by mouth daily.     predniSONE  (DELTASONE ) 10 MG tablet 40 mg x1 day, then 30 mg x1 day, then 20 mg x1 day, then 10 mg x1 day, and then 5 mg x1 day and stop 11 tablet 0   Respiratory Therapy Supplies (FLUTTER) DEVI Use as directed 1 each 0   roflumilast  (DALIRESP ) 500 MCG TABS tablet Take 1 tablet (500 mcg total) by mouth daily. 30 tablet 11   sodium chloride  HYPERTONIC 3 % nebulizer solution Take by nebulization 2 (two) times daily. 750 mL 12   Current Facility-Administered Medications   Medication Dose Route Frequency Provider Last Rate Last Myrna Rear - ARNASA [id number] (open-label) - astegolimab 238 mg/1.7 mL SQ injection (PI-Ramaswamy)  476 mg Subcutaneous Q14 Days        Study - ARNASA [id number] - astegolimab 238 mg/1.7 mL or placebo SQ injection (PI-Ramaswamy)  476 mg Subcutaneous Q14 Days        Study - ARNASA [id number] - astegolimab 238 mg/1.7 mL or placebo SQ injection (PI-Ramaswamy)  476 mg Subcutaneous Q14 Days    476 mg at 09/12/23 0930    REVIEW OF SYSTEMS:   [X]  denotes positive finding, [ ]  denotes negative finding Cardiac  Comments:  Chest pain or chest pressure:    Shortness of breath upon exertion:    Short of breath when lying flat:    Irregular heart rhythm:        Vascular    Pain in calf, thigh, or hip brought on by ambulation:    Pain in feet at night that wakes you up from your sleep:     Blood clot in your veins:    Leg swelling:         Pulmonary    Oxygen  at home: x   Productive cough:     Wheezing:         Neurologic    Sudden weakness in arms or legs:     Sudden numbness in arms or legs:     Sudden onset of difficulty speaking or slurred speech:    Temporary loss of  vision in one eye:     Problems with dizziness:         Gastrointestinal    Blood in stool:      Vomited blood:         Genitourinary    Burning when urinating:     Blood in urine:        Psychiatric    Major depression:         Hematologic    Bleeding problems:    Problems with blood clotting too easily:        Skin    Rashes or ulcers:        Constitutional    Fever or chills:     PHYSICAL EXAM:   Vitals:   01/08/24 0959 01/08/24 1001  BP: (!) 157/74 (!) 144/68  Pulse: 63   Temp: 97.8 F (36.6 C)   SpO2: 96%   Weight: 165 lb (74.8 kg)   Height: 5' 7 (1.702 m)     GENERAL: The patient is a well-nourished female, in no acute distress. The vital signs are documented above. CARDIAC: There is a regular rate and rhythm. PULMONARY: Nonlabored  respirations ABDOMEN: Soft and non-tender  MUSCULOSKELETAL: There are no major deformities or cyanosis. NEUROLOGIC: No focal weakness or paresthesias are detected. SKIN: There are no ulcers or rashes noted. PSYCHIATRIC: The patient has a normal affect.  STUDIES:   I have reviewed her CT scan with the following findings: 1. Advanced atherosclerosis in the neck Positive for - short segment high-grade RADIOGRAPHIC STRING SIGN Stenosis at the Left Carotid Bifurcation. Left ICA remains patent, with comparatively mild atherosclerosis at the distal bulb and in the visible left siphon. - High-grade stenosis of the proximal Right Subclavian Artery, numerically estimated at 80%.   2. Right ICA plaque with less than 50% stenosis.   3. Congenital diminutive right vertebral artery. Dominant Left Vertebral Artery with no significant stenosis. Diminutive Basilar artery which might also be congenital (such as due to fetal PCAs) but is incompletely evaluated.   4.  Aortic Atherosclerosis (ICD10-I70.0).   5. Emphysema (ICD10-J43.9). ASSESSMENT and PLAN   ?  Symptomatic left carotid stenosis: The patient has undergone an attempt at transfemoral stenting which was unsuccessful.  She is a poor candidate for general anesthesia given her oxygen  dependence.  I am evaluating her for left-sided TCAR which should be done under regional or MAC anesthesia.  I discussed the details of the procedure with the patient and her family including the risk of stroke, worsening pulmonary conditions, intubation and ventilator dependence.  She understands all these and wants to proceed to lower her risk for stroke.  We discussed continuing dual antiplatelet therapy and statin therapy.  She will be sent over for anesthesia clearance.  We also touched base with cardiology and pulmonary.   Malvina Serene CLORE, MD, FACS [facility] [phone] Pager [phone]

## 2024-01-08 NOTE — Progress Notes (Signed)
 [facility]  Patient name: Maria [name] MRN: [id number] DOB: January 12, 1956 Sex: female   REQUESTING PROVIDER:   Dr. Jama   REASON FOR CONSULT:    Left carotid stenosis  HISTORY OF PRESENT ILLNESS:   Maria [name] is a 68 y.o. female, who is referred by Dr. Jama for us  for evaluation of left carotid stenosis.  The patient has a history of an attempt at left carotid stenting on 03/07/2023.  The lesion could not be crossed.  Because of her pulmonary comorbidities, she is not a good candidate for general anesthesia and so she is being sent over for TCAR evaluation.  She does complain of some dizziness.  She also notes numbness in her right arm and right leg.  She is on dual antiplatelet therapy with aspirin  and Plavix .  She is taking a statin.  The patient suffers from COPD with oxygen  dependence (5 L).  She recently saw pulmonary, Dr. Geronimo.  She is followed by Dr. Ladona for her cardiac disease  PAST MEDICAL HISTORY    Past Medical History:  Diagnosis Date   Atherosclerosis 1/0612014   aorta, iliacs and CFA bilaterally no greater than 0-49% - lower arterial doppler.   Blood transfusion without reported diagnosis 2005   Cancer [facility]) Ovarian 1978   Chest pain    COPD (chronic obstructive pulmonary disease) (HCC)    COVID-19 long hauler manifesting chronic dyspnea    Emphysema of lung (HCC) 2019   Oxygen  deficiency 03/2019   Oxygen  dependent    3 liters up to 5     FAMILY HISTORY   Family History  Problem Relation Age of Onset   Heart failure Mother    Diabetes Mother    Kidney disease Mother    Hypertension Mother    Arthritis Mother    Heart disease Mother    Obesity Mother    COPD Father    Hearing loss Father    Lung cancer Brother    COPD Brother    COPD Brother    Asthma Son    Diabetes Son    Birth defects Son    Breast cancer Maternal Aunt 40    SOCIAL HISTORY:   Social History    Socioeconomic History   Marital status: Married    Spouse name: Evalene Manufacturing systems engineer)   Number of children: 3   Years of education: Some college   Highest education level: GED or equivalent  Occupational History   Not on file  Tobacco Use   Smoking status: Former    Current packs/day: 0.00    Average packs/day: 1 pack/day for 45.0 years (45.0 ttl pk-yrs)    Types: Cigarettes    Start date: 03/19/1971    Quit date: 03/18/2016    Years since quitting: 7.8    Passive exposure: Past   Smokeless tobacco: Never  Vaping Use   Vaping status: Never Used  Substance and Sexual Activity   Alcohol use: No    Alcohol/week: 0.0 standard drinks of alcohol   Drug use: No   Sexual activity: Not Currently  Other Topics Concern   Not on file  Social History Narrative   08/27/19   From: the area (but [place] area[place]   Living: with husband [name] - since 1987   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      Family: Daughter Mckinley [place] nearby, son Adriana in [place], and other daughter - 1 hour; 5 grandchildren  Enjoys: crochet, relax at home      Exercise: on hold but has been doing pulmonary rehab   Diet: not currently, but has lost weight      Safety   Seat belts: Yes    Guns: Yes  and secure   Safe in relationships: Yes    Social Drivers of Health   Financial Resource Strain: Low Risk  (06/05/2023)   Overall Financial Resource Strain (CARDIA)    Difficulty of Paying Living Expenses: Not very hard  Food Insecurity: No Food Insecurity (06/05/2023)   Hunger Vital Sign    Worried About Running Out of Food in the Last Year: Never true    Ran Out of Food in the Last Year: Never true  Transportation Needs: No Transportation Needs (06/05/2023)   PRAPARE - Administrator, Civil Service (Medical): No    Lack of Transportation (Non-Medical): No  Physical Activity: Inactive (06/05/2023)   Exercise Vital Sign    Days of Exercise per Week: 0 days    Minutes of Exercise per  Session: 0 min  Stress: Stress Concern Present (06/05/2023)   [employer] of Occupational Health - Occupational Stress Questionnaire    Feeling of Stress : Rather much  Social Connections: Socially Integrated (06/05/2023)   Social Connection and Isolation Panel    Frequency of Communication with Friends and Family: More than three times a week    Frequency of Social Gatherings with Friends and Family: Once a week    Attends Religious Services: More than 4 times per year    Active Member of [employer] or Organizations: Yes    Attends Engineer, structural: More than 4 times per year    Marital Status: Married  Catering manager Violence: Not At Risk (05/10/2023)   Humiliation, Afraid, Rape, and Kick questionnaire    Fear of Current or Ex-Partner: No    Emotionally Abused: No    Physically Abused: No    Sexually Abused: No    ALLERGIES:    Allergies  Allergen Reactions   Montelukast      Bad dreams    CURRENT MEDICATIONS:    Current Outpatient Medications  Medication Sig Dispense Refill   albuterol  (VENTOLIN  HFA) 108 (90 Base) MCG/ACT inhaler TAKE 2 PUFFS BY MOUTH EVERY 6 HOURS AS NEEDED FOR WHEEZE OR SHORTNESS OF BREATH 8.5 each 5   ASPIRIN  LOW DOSE 81 MG EC tablet TAKE 1 TABLET BY MOUTH EVERY DAY 90 tablet 3   atorvastatin  (LIPITOR) 20 MG tablet Take 1 tablet (20 mg total) by mouth daily. 90 tablet 1   Azelastine  HCl 137 MCG/SPRAY SOLN USE 1 SPRAY INTO EACH NOSTRIL TWICE A DAY 30 mL 1   azithromycin  (ZITHROMAX ) 250 MG tablet Azithromycin  500mg  day 1 and then 250mg  daily x next 4 days 6 tablet 0   benzonatate  (TESSALON ) 200 MG capsule Take 1 capsule by mouth three times daily as needed 90 capsule 0   Budeson-Glycopyrrol-Formoterol  (BREZTRI  AEROSPHERE) 160-9-4.8 MCG/ACT AERO Inhale 2 puffs into the lungs in the morning and at bedtime. 3 each 3   Cholecalciferol (VITAMIN D -3) 125 MCG (5000 UT) TABS Take 5,000 Units by mouth every evening.     clopidogrel  (PLAVIX ) 75 MG  tablet Take 1 tablet (75 mg total) by mouth daily. 90 tablet 3   cyclobenzaprine  (FLEXERIL ) 5 MG tablet TAKE 1 TABLET BY MOUTH AT BEDTIME AS NEEDED FOR MUSCLE SPASM 30 tablet 0   escitalopram  (LEXAPRO ) 20 MG tablet Take 1 tablet by mouth  once daily 90 tablet 0   gabapentin  (NEURONTIN ) 600 MG tablet Take 1 tablet (600 mg total) by mouth every 8 (eight) hours. 90 tablet 5   hydrOXYzine  (VISTARIL ) 25 MG capsule TAKE 1 CAPSULE BY MOUTH AT BEDTIME AS NEEDED 90 capsule 0   ipratropium-albuterol  (DUONEB) 0.5-2.5 (3) MG/3ML SOLN USE 1 AMPULE IN NEBULIZER 4 TIMES DAILY AS NEEDED 360 mL 0   isosorbide  mononitrate (IMDUR ) 30 MG 24 hr tablet Take 1 tablet (30 mg total) by mouth every morning. 30 tablet 2   methocarbamol  (ROBAXIN ) 500 MG tablet Take 1 tablet (500 mg total) by mouth at bedtime as needed for muscle spasms. 15 tablet 0   metoprolol  succinate (TOPROL -XL) 25 MG 24 hr tablet Take 1 tablet (25 mg total) by mouth every evening. Take with or immediately following a meal. 30 tablet 2   nitroGLYCERIN  (NITROSTAT ) 0.4 MG SL tablet Place 1 tablet (0.4 mg total) under the tongue every 5 (five) minutes as needed. 25 tablet 1   oxybutynin  (DITROPAN -XL) 10 MG 24 hr tablet Take 1 tablet (10 mg total) by mouth daily. 90 tablet 3   OXYGEN  Inhale into the lungs. 3 liters at rest and 5 liters with any activity.-     pantoprazole  (PROTONIX ) 40 MG tablet Take 1 tablet by mouth once daily 30 tablet 3   Potassium 99 MG TABS Take 99 mg by mouth daily.     predniSONE  (DELTASONE ) 10 MG tablet 40 mg x1 day, then 30 mg x1 day, then 20 mg x1 day, then 10 mg x1 day, and then 5 mg x1 day and stop 11 tablet 0   Respiratory Therapy Supplies (FLUTTER) DEVI Use as directed 1 each 0   roflumilast  (DALIRESP ) 500 MCG TABS tablet Take 1 tablet (500 mcg total) by mouth daily. 30 tablet 11   sodium chloride  HYPERTONIC 3 % nebulizer solution Take by nebulization 2 (two) times daily. 750 mL 12   Current Facility-Administered Medications   Medication Dose Route Frequency Provider Last Rate Last Myrna Rear - ARNASA [id number] (open-label) - astegolimab 238 mg/1.7 mL SQ injection (PI-Ramaswamy)  476 mg Subcutaneous Q14 Days        Study - ARNASA [id number] - astegolimab 238 mg/1.7 mL or placebo SQ injection (PI-Ramaswamy)  476 mg Subcutaneous Q14 Days        Study - ARNASA [id number] - astegolimab 238 mg/1.7 mL or placebo SQ injection (PI-Ramaswamy)  476 mg Subcutaneous Q14 Days    476 mg at 09/12/23 0930    REVIEW OF SYSTEMS:   [X]  denotes positive finding, [ ]  denotes negative finding Cardiac  Comments:  Chest pain or chest pressure:    Shortness of breath upon exertion:    Short of breath when lying flat:    Irregular heart rhythm:        Vascular    Pain in calf, thigh, or hip brought on by ambulation:    Pain in feet at night that wakes you up from your sleep:     Blood clot in your veins:    Leg swelling:         Pulmonary    Oxygen  at home: x   Productive cough:     Wheezing:         Neurologic    Sudden weakness in arms or legs:     Sudden numbness in arms or legs:     Sudden onset of difficulty speaking or slurred speech:    Temporary loss of  vision in one eye:     Problems with dizziness:         Gastrointestinal    Blood in stool:      Vomited blood:         Genitourinary    Burning when urinating:     Blood in urine:        Psychiatric    Major depression:         Hematologic    Bleeding problems:    Problems with blood clotting too easily:        Skin    Rashes or ulcers:        Constitutional    Fever or chills:     PHYSICAL EXAM:   Vitals:   01/08/24 0959 01/08/24 1001  BP: (!) 157/74 (!) 144/68  Pulse: 63   Temp: 97.8 F (36.6 C)   SpO2: 96%   Weight: 165 lb (74.8 kg)   Height: 5' 7 (1.702 m)     GENERAL: The patient is a well-nourished female, in no acute distress. The vital signs are documented above. CARDIAC: There is a regular rate and rhythm. PULMONARY: Nonlabored  respirations ABDOMEN: Soft and non-tender  MUSCULOSKELETAL: There are no major deformities or cyanosis. NEUROLOGIC: No focal weakness or paresthesias are detected. SKIN: There are no ulcers or rashes noted. PSYCHIATRIC: The patient has a normal affect.  STUDIES:   I have reviewed her CT scan with the following findings: 1. Advanced atherosclerosis in the neck Positive for - short segment high-grade RADIOGRAPHIC STRING SIGN Stenosis at the Left Carotid Bifurcation. Left ICA remains patent, with comparatively mild atherosclerosis at the distal bulb and in the visible left siphon. - High-grade stenosis of the proximal Right Subclavian Artery, numerically estimated at 80%.   2. Right ICA plaque with less than 50% stenosis.   3. Congenital diminutive right vertebral artery. Dominant Left Vertebral Artery with no significant stenosis. Diminutive Basilar artery which might also be congenital (such as due to fetal PCAs) but is incompletely evaluated.   4.  Aortic Atherosclerosis (ICD10-I70.0).   5. Emphysema (ICD10-J43.9). ASSESSMENT and PLAN   ?  Symptomatic left carotid stenosis: The patient has undergone an attempt at transfemoral stenting which was unsuccessful.  She is a poor candidate for general anesthesia given her oxygen  dependence.  I am evaluating her for left-sided TCAR which should be done under regional or MAC anesthesia.  I discussed the details of the procedure with the patient and her family including the risk of stroke, worsening pulmonary conditions, intubation and ventilator dependence.  She understands all these and wants to proceed to lower her risk for stroke.  We discussed continuing dual antiplatelet therapy and statin therapy.  She will be sent over for anesthesia clearance.  We also touched base with cardiology and pulmonary.   Malvina Serene CLORE, MD, FACS [facility] [phone] Pager [phone]

## 2024-01-10 ENCOUNTER — Encounter: Payer: Self-pay | Admitting: Nurse Practitioner

## 2024-01-10 ENCOUNTER — Encounter (HOSPITAL_COMMUNITY): Payer: Self-pay

## 2024-01-10 NOTE — Pre-Procedure Instructions (Signed)
 Surgical Instructions   Your procedure is scheduled on January 12, 2024. Report to [facility] Main Entrance A at 7:55 A.M., then check in with the Admitting office. Any questions or running late day of surgery: call [phone]  Questions prior to your surgery date: call [phone], Monday-Friday, 8am-4pm. If you experience any cold or flu symptoms such as cough, fever, chills, shortness of breath, etc. between now and your scheduled surgery, please notify us  at the above number.     Remember:  Do not eat or drink after midnight the night before your surgery    Take these medicines the morning of surgery with A SIP OF WATER: ASPIRIN   atorvastatin  (LIPITOR)  Azelastine  Nasal Spray Budeson-Glycopyrrol-Formoterol  (BREZTRI  AEROSPHERE) Inhaler clopidogrel  (PLAVIX )  escitalopram  (LEXAPRO )  gabapentin  (NEURONTIN )  isosorbide  mononitrate (IMDUR )  metoprolol  succinate (TOPROL -XL)  oxybutynin  (DITROPAN -XL)  pantoprazole  (PROTONIX )  roflumilast  (DALIRESP )  sodium chloride  HYPERTONIC 3 % nebulizer solution    May take these medicines IF NEEDED: albuterol  (VENTOLIN  HFA) inhaler - please bring inhaler with you morning of surgery ipratropium-albuterol  (DUONEB) nebulizer methocarbamol  (ROBAXIN )  nitroGLYCERIN  (NITROSTAT ) - if dose taken prior to surgery, please call either of the above phone numbers   One week prior to surgery, STOP taking any Aleve, Naproxen, Ibuprofen, Motrin, Advil, Goody's, BC's, all herbal medications, fish oil, and non-prescription vitamins.                     Do NOT Smoke (Tobacco/Vaping) for 24 hours prior to your procedure.  If you use a CPAP at night, you may bring your mask/headgear for your overnight stay.   You will be asked to remove any contacts, glasses, piercing's, hearing aid's, dentures/partials prior to surgery. Please bring cases for these items if needed.    Patients discharged the day of surgery will not be allowed to drive home, and someone  needs to stay with them for 24 hours.  SURGICAL WAITING ROOM VISITATION Patients may have no more than 2 support people in the waiting area - these visitors may rotate.   Pre-op nurse will coordinate an appropriate time for 1 ADULT support person, who may not rotate, to accompany patient in pre-op.  Children under the age of 73 must have an adult with them who is not the patient and must remain in the main waiting area with an adult.  If the patient needs to stay at the hospital during part of their recovery, the visitor guidelines for inpatient rooms apply.  Please refer to the [facility] website for the visitor guidelines for any additional information.   If you received a COVID test during your pre-op visit  it is requested that you wear a mask when out in public, stay away from anyone that may not be feeling well and notify your surgeon if you develop symptoms. If you have been in contact with anyone that has tested positive in the last 10 days please notify you surgeon.      Pre-operative CHG Bathing Instructions   You can play a key role in reducing the risk of infection after surgery. Your skin needs to be as free of germs as possible. You can reduce the number of germs on your skin by washing with CHG (chlorhexidine  gluconate) soap before surgery. CHG is an antiseptic soap that kills germs and continues to kill germs even after washing.   DO NOT use if you have an allergy to chlorhexidine /CHG or antibacterial soaps. If your skin becomes reddened or irritated, stop  using the CHG and notify one of our RNs at [phone].              TAKE A SHOWER THE NIGHT BEFORE SURGERY AND THE DAY OF SURGERY    Please keep in mind the following:  DO NOT shave, including legs and underarms, 48 hours prior to surgery.   You may shave your face before/day of surgery.  Place clean sheets on your bed the night before surgery Use a clean washcloth (not used since being washed) for each shower. DO  NOT sleep with pet's night before surgery.  CHG Shower Instructions:  Wash your face and private area with normal soap. If you choose to wash your hair, wash first with your normal shampoo.  After you use shampoo/soap, rinse your hair and body thoroughly to remove shampoo/soap residue.  Turn the water OFF and apply half the bottle of CHG soap to a CLEAN washcloth.  Apply CHG soap ONLY FROM YOUR NECK DOWN TO YOUR TOES (washing for 3-5 minutes)  DO NOT use CHG soap on face, private areas, open wounds, or sores.  Pay special attention to the area where your surgery is being performed.  If you are having back surgery, having someone wash your back for you may be helpful. Wait 2 minutes after CHG soap is applied, then you may rinse off the CHG soap.  Pat dry with a clean towel  Put on clean pajamas    Additional instructions for the day of surgery: DO NOT APPLY any lotions, deodorants, cologne, or perfumes.   Do not wear jewelry or makeup Do not wear nail polish, gel polish, artificial nails, or any other type of covering on natural nails (fingers and toes) Do not bring valuables to the hospital. [facility] is not responsible for valuables/personal belongings. Put on clean/comfortable clothes.  Please brush your teeth.  Ask your nurse before applying any prescription medications to the skin.

## 2024-01-10 NOTE — Telephone Encounter (Signed)
 Dr. Geronimo, Please see patient message regarding upcoming surgery.  Thank you.

## 2024-01-11 ENCOUNTER — Encounter (HOSPITAL_COMMUNITY)
Admission: RE | Admit: 2024-01-11 | Discharge: 2024-01-11 | Disposition: A | Discharge status: Home / Self Care | Source: Ambulatory Visit | Attending: Surgery | Admitting: Surgery

## 2024-01-11 ENCOUNTER — Telehealth: Payer: Self-pay | Admitting: Nurse Practitioner

## 2024-01-11 ENCOUNTER — Other Ambulatory Visit: Payer: Self-pay

## 2024-01-11 ENCOUNTER — Encounter (HOSPITAL_COMMUNITY): Payer: Self-pay

## 2024-01-11 ENCOUNTER — Telehealth: Payer: Self-pay

## 2024-01-11 VITALS — BP 137/61 | HR 64 | Temp 98.2°F | Resp 17

## 2024-01-11 DIAGNOSIS — Z79620 Long term (current) use of immunosuppressive biologic: Secondary | ICD-10-CM | POA: Diagnosis not present

## 2024-01-11 DIAGNOSIS — Z825 Family history of asthma and other chronic lower respiratory diseases: Secondary | ICD-10-CM | POA: Diagnosis not present

## 2024-01-11 DIAGNOSIS — Z841 Family history of disorders of kidney and ureter: Secondary | ICD-10-CM | POA: Diagnosis not present

## 2024-01-11 DIAGNOSIS — Z01818 Encounter for other preprocedural examination: Secondary | ICD-10-CM

## 2024-01-11 DIAGNOSIS — J9610 Chronic respiratory failure, unspecified whether with hypoxia or hypercapnia: Secondary | ICD-10-CM | POA: Insufficient documentation

## 2024-01-11 DIAGNOSIS — U099 Post covid-19 condition, unspecified: Secondary | ICD-10-CM | POA: Diagnosis not present

## 2024-01-11 DIAGNOSIS — Z803 Family history of malignant neoplasm of breast: Secondary | ICD-10-CM | POA: Diagnosis not present

## 2024-01-11 DIAGNOSIS — J439 Emphysema, unspecified: Secondary | ICD-10-CM | POA: Insufficient documentation

## 2024-01-11 DIAGNOSIS — I700 Atherosclerosis of aorta: Secondary | ICD-10-CM | POA: Diagnosis not present

## 2024-01-11 DIAGNOSIS — Z888 Allergy status to other drugs, medicaments and biological substances status: Secondary | ICD-10-CM | POA: Diagnosis not present

## 2024-01-11 DIAGNOSIS — Z9981 Dependence on supplemental oxygen: Secondary | ICD-10-CM | POA: Insufficient documentation

## 2024-01-11 DIAGNOSIS — Z01812 Encounter for preprocedural laboratory examination: Secondary | ICD-10-CM | POA: Insufficient documentation

## 2024-01-11 DIAGNOSIS — R0609 Other forms of dyspnea: Secondary | ICD-10-CM | POA: Diagnosis not present

## 2024-01-11 DIAGNOSIS — I6523 Occlusion and stenosis of bilateral carotid arteries: Secondary | ICD-10-CM | POA: Insufficient documentation

## 2024-01-11 DIAGNOSIS — Z822 Family history of deafness and hearing loss: Secondary | ICD-10-CM | POA: Diagnosis not present

## 2024-01-11 DIAGNOSIS — I2510 Atherosclerotic heart disease of native coronary artery without angina pectoris: Secondary | ICD-10-CM | POA: Diagnosis not present

## 2024-01-11 DIAGNOSIS — Z87891 Personal history of nicotine dependence: Secondary | ICD-10-CM | POA: Diagnosis not present

## 2024-01-11 DIAGNOSIS — Z801 Family history of malignant neoplasm of trachea, bronchus and lung: Secondary | ICD-10-CM | POA: Diagnosis not present

## 2024-01-11 DIAGNOSIS — Z7982 Long term (current) use of aspirin: Secondary | ICD-10-CM | POA: Diagnosis not present

## 2024-01-11 DIAGNOSIS — Z8261 Family history of arthritis: Secondary | ICD-10-CM | POA: Diagnosis not present

## 2024-01-11 DIAGNOSIS — Z79899 Other long term (current) drug therapy: Secondary | ICD-10-CM | POA: Diagnosis not present

## 2024-01-11 DIAGNOSIS — Z8249 Family history of ischemic heart disease and other diseases of the circulatory system: Secondary | ICD-10-CM | POA: Diagnosis not present

## 2024-01-11 DIAGNOSIS — Z833 Family history of diabetes mellitus: Secondary | ICD-10-CM | POA: Diagnosis not present

## 2024-01-11 DIAGNOSIS — Z7902 Long term (current) use of antithrombotics/antiplatelets: Secondary | ICD-10-CM | POA: Diagnosis not present

## 2024-01-11 DIAGNOSIS — I9581 Postprocedural hypotension: Secondary | ICD-10-CM | POA: Diagnosis not present

## 2024-01-11 HISTORY — DX: Depression, unspecified: F32.A

## 2024-01-11 HISTORY — DX: Peripheral vascular disease, unspecified: I73.9

## 2024-01-11 HISTORY — DX: Dyspnea, unspecified: R06.00

## 2024-01-11 HISTORY — DX: Pneumonia, unspecified organism: J18.9

## 2024-01-11 HISTORY — DX: Angina pectoris, unspecified: I20.9

## 2024-01-11 HISTORY — DX: Atherosclerotic heart disease of native coronary artery without angina pectoris: I25.10

## 2024-01-11 LAB — TYPE AND SCREEN
ABO/RH(D): O POS
Antibody Screen: NEGATIVE

## 2024-01-11 LAB — URINALYSIS, ROUTINE W REFLEX MICROSCOPIC
Bilirubin Urine: NEGATIVE
Glucose, UA: NEGATIVE mg/dL
Hgb urine dipstick: NEGATIVE
Ketones, ur: NEGATIVE mg/dL
Nitrite: NEGATIVE
Protein, ur: NEGATIVE mg/dL
Specific Gravity, Urine: 1.009 (ref 1.005–1.030)
pH: 6 (ref 5.0–8.0)

## 2024-01-11 LAB — COMPREHENSIVE METABOLIC PANEL WITH GFR
ALT: 19 U/L (ref 0–44)
AST: 25 U/L (ref 15–41)
Albumin: 3.5 g/dL (ref 3.5–5.0)
Alkaline Phosphatase: 68 U/L (ref 38–126)
Anion gap: 7 (ref 5–15)
BUN: 13 mg/dL (ref 8–23)
CO2: 23 mmol/L (ref 22–32)
Calcium: 9.4 mg/dL (ref 8.9–10.3)
Chloride: 105 mmol/L (ref 98–111)
Creatinine, Ser: 0.75 mg/dL (ref 0.44–1.00)
GFR, Estimated: >60 mL/min (ref >60)
Glucose, Bld: 88 mg/dL (ref 70–99)
Potassium: 4.4 mmol/L (ref 3.5–5.1)
Sodium: 135 mmol/L (ref 135–145)
Total Bilirubin: 0.5 mg/dL (ref 0.0–1.2)
Total Protein: 6.8 g/dL (ref 6.5–8.1)

## 2024-01-11 LAB — PROTIME-INR
INR: 0.9 (ref 0.8–1.2)
Prothrombin Time: 12.6 s (ref 11.4–15.2)

## 2024-01-11 LAB — CBC
HCT: 35.2 % — ABNORMAL LOW (ref 36.0–46.0)
Hemoglobin: 11.5 g/dL — ABNORMAL LOW (ref 12.0–15.0)
MCH: 30.5 pg (ref 26.0–34.0)
MCHC: 32.7 g/dL (ref 30.0–36.0)
MCV: 93.4 fL (ref 80.0–100.0)
Platelets: 326 10*3/uL (ref 150–400)
RBC: 3.77 MIL/uL — ABNORMAL LOW (ref 3.87–5.11)
RDW: 13.1 % (ref 11.5–15.5)
WBC: 9.1 10*3/uL (ref 4.0–10.5)
nRBC: 0 % (ref 0.0–0.2)

## 2024-01-11 LAB — SURGICAL PCR SCREEN
MRSA, PCR: NEGATIVE
Staphylococcus aureus: NEGATIVE

## 2024-01-11 LAB — APTT: aPTT: 26 s (ref 24–36)

## 2024-01-11 NOTE — Progress Notes (Signed)
 Alan Glance, RN with [facility], made aware of patients UA results and will make Dr. Serene aware.

## 2024-01-11 NOTE — Progress Notes (Signed)
 Patient made aware of surgery time change on 01/12/2024. Patient to arrive at 0530. Patient verbalized understanding. No further questions asked.

## 2024-01-11 NOTE — Telephone Encounter (Signed)
 Attempted to call patient re: surgery arrival time change.  Patient needs to arrive at 5:30 AM on 6/27.

## 2024-01-11 NOTE — Progress Notes (Signed)
 PCP - Lynwood Crandall, NP Cardiologist - Dr. Gordy Bergamo - last office visit 11/22/2023 Pulmonology - Dr. Dorethia Cave - Last office visit 06/30/2023. Also in research trial.  PPM/ICD - Denies Device Orders - n/a Rep Notified - n/a  Chest x-ray - 05/10/2023 - CT Chest 05/11/2023 EKG - 11/22/2023 Stress Test - 12/07/2023 ECHO - 12/27/2023 Cardiac Cath - Denies  Sleep Study - Denies CPAP - n/a  No DM  Last dose of GLP1 agonist- n/a GLP1 instructions: n/a  Blood Thinner Instructions: Pt instructed to continue Plavix  through the morning of surgery Aspirin  Instructions: Pt instructed to continue ASA through the morning of surgery  NPO after midnight  COVID TEST- n/a   Anesthesia review: Yes. CAD, HTN, Carotid artery stenosis, COPD on 3-5L oxygen  continuously (5L with exertion and 3-4L when resting). Seen by [name], PA-C during appointment. Recent COPD exacerbation and she completed antibiotic and prednisone  in May 2025   Patient denies shortness of breath, fever, cough and chest pain at PAT appointment. Pt denies any respiratory illness/infection in the last two months.    All instructions explained to the patient, with a verbal understanding of the material. Patient agrees to go over the instructions while at home for a better understanding. Patient also instructed to self quarantine after being tested for COVID-19. The opportunity to ask questions was provided.

## 2024-01-11 NOTE — Anesthesia Preprocedure Evaluation (Addendum)
 Anesthesia Evaluation  Patient identified by MRN, date of birth, ID band Patient awake    Reviewed: Allergy & Precautions, NPO status , Patient's Chart, lab work & pertinent test results, reviewed documented beta blocker date and time   History of Anesthesia Complications Negative for: history of anesthetic complications  Airway Mallampati: I  TM Distance: >3 FB Neck ROM: Full    Dental  (+) Edentulous Upper, Edentulous Lower   Pulmonary COPD,  COPD inhaler and oxygen  dependent, former smoker   breath sounds clear to auscultation       Cardiovascular hypertension, Pt. on medications and Pt. on home beta blockers (-) angina + CAD (mild calcifications on CT) and + Peripheral Vascular Disease   Rhythm:Regular Rate:Normal  12/27/2023 ECHO: EF 60-65%, normal LVF, normal RVF, no significant valvular abnormalities  11/2023 Stress: no ischemia, low risk study   Neuro/Psych  Headaches  Anxiety Depression       GI/Hepatic Neg liver ROS,GERD  Medicated and Controlled,,  Endo/Other  negative endocrine ROS    Renal/GU negative Renal ROS     Musculoskeletal negative musculoskeletal ROS (+)    Abdominal   Peds  Hematology Plavix  Hb 11.5, plt 326k   Anesthesia Other Findings   Reproductive/Obstetrics                             Anesthesia Physical Anesthesia Plan  ASA: 3  Anesthesia Plan: MAC   Post-op Pain Management: Tylenol  PO (pre-op)*   Induction:   PONV Risk Score and Plan: 2 and Dexamethasone  and Ondansetron   Airway Management Planned:   Additional Equipment: Arterial line  Intra-op Plan:   Post-operative Plan:   Informed Consent: I have reviewed the patients History and Physical, chart, labs and discussed the procedure including the risks, benefits and alternatives for the proposed anesthesia with the patient or authorized representative who has indicated his/her understanding and  acceptance.       Plan Discussed with: Surgeon and CRNA  Anesthesia Plan Comments: (PAT note by [name], PA-C: 68 yo female follows with pulmonology for hx of former smoker with COPD, emphysema, and chronic respiratory failure on 4-5L O2 continuous. Currently in a research trial investigating effect of monoclonal antibody astegolimab on COPD. She is followed by Dr. Chiquita.  Recent benign cardiology evaluation with Dr. Ladona. Echo 12/2023 showed EF 60-65% normal RV, no significant valvular abnormalities. Nuclear stress 11/2023 was low risk.   Follows with vascular surgery for history of carotid stenosis. The patient has a history of an angioplasty of the left internal carotid artery with unsuccessful stent placement on 03/07/2023. The lesion could not be crossed.  She initially did well after angioplasty, however, follow-up duplex ultrasound 01/01/24 showed progression of the left internal carotid lesion to >80%.  Because of her pulmonary comorbidities, she was felt not a good candidate for general anesthesia and was referred to [facility] for TCAR evaluation. Per Dr. Russ note 01/08/24, The patient has undergone an attempt at transfemoral stenting which was unsuccessful. She is a poor candidate for general anesthesia given her oxygen  dependence. I am evaluating her for left-sided TCAR which should be done under regional or MAC anesthesia.  I evaluated patient at preadmission testing appointment.  She reports that she currently feels quite stable from a pulmonary standpoint.  She is using 4 to 5 L of supplemental O2 continuously.  She walks with a cane and tries to be as active as possible.  She does  pace herself and stop and rest as needed.  She monitors her oxygen  saturation and titrates her oxygen  as needed.  Often 4 L at rest and 5 L with activity.  She has no current shortness of breath at rest, is able to carry conversations without dyspnea.  She states that she is able to lie flat, however, it  does make her feel a bit anxious and she feels she does not breathe as well.  She does note that she has tolerated recent procedures under sedation while lying flat without issue.  She says when she has some medication to control her anxiety, she is generally able to lie flat without a problem.  We discussed that MAC would be the preferred anesthesia approach.  She does understand that if she is unable to tolerate the procedure under MAC that it would likely be completed under general anesthesia.  We discussed that given her severe pulmonary disease she is at high risk of complications namely postoperative respiratory failure and prolonged mechanical ventilation.  She understands these risks and states that she would still wish to proceed with surgery under general anesthesia if necessary to address her carotid stenosis.  Preop labs reviewed, mild anemia hemoglobin 11.5, otherwise unremarkable.  EKG 11/22/23: Normal sinus rhythm at the rate of 65 beats per minute, normal axis, poor R progression, cannot exclude anteroseptal infarct old. Compared to 05/10/2023, QS complexes more pronounced. However at this pattern was also seen on 10/04/2008  TTE 12/27/23: 1. Left ventricular ejection fraction, by estimation, is 60 to 65%. Left  ventricular ejection fraction by 3D volume is 61 %. The left ventricle has  normal function. The left ventricle has no regional wall motion  abnormalities. Left ventricular diastolic  parameters were normal. The average left ventricular global longitudinal  strain is -26.1 %. The global longitudinal strain is normal.  2. Right ventricular systolic function is normal. The right ventricular  size is normal.  3. The mitral valve is normal in structure. No evidence of mitral valve  regurgitation. No evidence of mitral stenosis.  4. The aortic valve is normal in structure. Aortic valve regurgitation is  not visualized. No aortic stenosis is present.  5. The inferior vena  cava is normal in size with greater than 50%  respiratory variability, suggesting right atrial pressure of 3 mmHg.   Carotid duplex 01/01/24: Summary:  Right Carotid: The extracranial vessels were near-normal with only minimal wall thickening or plaque. Evidence of a right subclavian steal noted.   Left Carotid: Velocities in the left proximal ICA/bifurcation are consistent  with a 80-99% stenosis. The ECA appears >50% stenosed.   Nuclear stress 12/07/23:   Findings are consistent with no ischemia. The study is low risk.   No ST deviation was noted. The ECG was not diagnostic due to pharmacologic protocol.   LV perfusion is normal.   Left ventricular function is normal. Nuclear stress EF: 71%. The left ventricular ejection fraction is hyperdynamic (>65%). End diastolic cavity size is normal. End systolic cavity size is normal.   CT images were obtained for attenuation correction and were examined for the presence of coronary calcium  when appropriate.   Coronary calcium  was present on the attenuation correction CT images. Mild coronary calcifications were present. Aortic atherosclerosis was noted. Coronary calcifications were present in the left anterior descending artery distribution(s).   Prior study available for comparison from 10/04/2008. No changes compared to prior study.   ECG rhythm shows normal sinus rhythm.  No reversible ischemia. LVEF  71% with normal wall motion. Mild LAD calcification and aortic atherosclerosis was noted. This is a low risk study. No change compared to prior study in 2010.  )        Anesthesia Quick Evaluation

## 2024-01-11 NOTE — Progress Notes (Signed)
 Anesthesia Chart Review:  68 yo female follows with pulmonology for hx of former smoker with COPD, emphysema, and chronic respiratory failure on 4-5L O2 continuous. Currently in a research trial investigating effect of monoclonal antibody astegolimab on COPD. She is followed by Dr. Chiquita.  Recent benign cardiology evaluation with Dr. Ladona. Echo 12/2023 showed EF 60-65% normal RV, no significant valvular abnormalities. Nuclear stress 11/2023 was low risk.   Follows with vascular surgery for history of carotid stenosis. The patient has a history of an angioplasty of the left internal carotid artery with unsuccessful stent placement on 03/07/2023. The lesion could not be crossed.  She initially did well after angioplasty, however, follow-up duplex ultrasound 01/01/24 showed progression of the left internal carotid lesion to >80%.  Because of her pulmonary comorbidities, she was felt not a good candidate for general anesthesia and was referred to [facility] for TCAR evaluation. Per Dr. Russ note 01/08/24, The patient has undergone an attempt at transfemoral stenting which was unsuccessful. She is a poor candidate for general anesthesia given her oxygen  dependence. I am evaluating her for left-sided TCAR which should be done under regional or MAC anesthesia.  I evaluated patient at preadmission testing appointment.  She reports that she currently feels quite stable from a pulmonary standpoint.  She is using 4 to 5 L of supplemental O2 continuously.  She walks with a cane and tries to be as active as possible.  She does pace herself and stop and rest as needed.  She monitors her oxygen  saturation and titrates her oxygen  as needed.  Often 4 L at rest and 5 L with activity.  She has no current shortness of breath at rest, is able to carry conversations without dyspnea.  She states that she is able to lie flat, however, it does make her feel a bit anxious and she feels she does not breathe as well.  She does note that  she has tolerated recent procedures under sedation while lying flat without issue.  She says when she has some medication to control her anxiety, she is generally able to lie flat without a problem.  We discussed that MAC would be the preferred anesthesia approach.  She does understand that if she is unable to tolerate the procedure under MAC that it would likely be completed under general anesthesia.  We discussed that given her severe pulmonary disease she is at high risk of complications namely postoperative respiratory failure and prolonged mechanical ventilation.  She understands these risks and states that she would still wish to proceed with surgery under general anesthesia if necessary to address her carotid stenosis.  Preop labs reviewed, mild anemia hemoglobin 11.5, otherwise unremarkable.  EKG 11/22/23: Normal sinus rhythm at the rate of 65 beats per minute, normal axis, poor R progression, cannot exclude anteroseptal infarct old. Compared to 05/10/2023, QS complexes more pronounced. However at this pattern was also seen on 10/04/2008  TTE 12/27/23:  1. Left ventricular ejection fraction, by estimation, is 60 to 65%. Left  ventricular ejection fraction by 3D volume is 61 %. The left ventricle has  normal function. The left ventricle has no regional wall motion  abnormalities. Left ventricular diastolic   parameters were normal. The average left ventricular global longitudinal  strain is -26.1 %. The global longitudinal strain is normal.   2. Right ventricular systolic function is normal. The right ventricular  size is normal.   3. The mitral valve is normal in structure. No evidence of mitral valve  regurgitation.  No evidence of mitral stenosis.   4. The aortic valve is normal in structure. Aortic valve regurgitation is  not visualized. No aortic stenosis is present.   5. The inferior vena cava is normal in size with greater than 50%  respiratory variability, suggesting right atrial  pressure of 3 mmHg.   Carotid duplex 01/01/24: Summary:  Right Carotid: The extracranial vessels were near-normal with only minimal wall thickening or plaque. Evidence of a right subclavian steal noted.   Left Carotid: Velocities in the left proximal ICA/bifurcation are consistent  with a 80-99% stenosis. The ECA appears >50% stenosed.   Nuclear stress 12/07/23:   Findings are consistent with no ischemia. The study is low risk.   No ST deviation was noted. The ECG was not diagnostic due to pharmacologic protocol.   LV perfusion is normal.   Left ventricular function is normal. Nuclear stress EF: 71%. The left ventricular ejection fraction is hyperdynamic (>65%). End diastolic cavity size is normal. End systolic cavity size is normal.   CT images were obtained for attenuation correction and were examined for the presence of coronary calcium  when appropriate.   Coronary calcium  was present on the attenuation correction CT images. Mild coronary calcifications were present. Aortic atherosclerosis was noted. Coronary calcifications were present in the left anterior descending artery distribution(s).   Prior study available for comparison from 10/04/2008. No changes compared to prior study.   ECG rhythm shows normal sinus rhythm.   No reversible ischemia. LVEF 71% with normal wall motion. Mild LAD calcification and aortic atherosclerosis was noted. This is a low risk study. No change compared to prior study in 2010.    Lynwood Geofm RIGGERS [facility] Short Stay Center/Anesthesiology Phone [phone] 01/11/2024 2:36 PM

## 2024-01-11 NOTE — Telephone Encounter (Signed)
 Patient wants you to fill out a parking placard and form is in provider's box.

## 2024-01-12 ENCOUNTER — Other Ambulatory Visit: Payer: Self-pay

## 2024-01-12 ENCOUNTER — Inpatient Hospital Stay (HOSPITAL_COMMUNITY): Payer: Self-pay | Admitting: Certified Registered"

## 2024-01-12 ENCOUNTER — Encounter (HOSPITAL_COMMUNITY)
Admission: RE | Disposition: A | Discharge status: Home / Self Care | Payer: Self-pay | Source: Home / Self Care | Attending: Surgery

## 2024-01-12 ENCOUNTER — Encounter (HOSPITAL_COMMUNITY): Payer: Self-pay | Admitting: Surgery

## 2024-01-12 ENCOUNTER — Inpatient Hospital Stay (HOSPITAL_COMMUNITY)
Admission: RE | Admit: 2024-01-12 | Discharge: 2024-01-13 | DRG: 035 | Disposition: A | Discharge status: Home / Self Care | Attending: Surgery | Admitting: Surgery

## 2024-01-12 ENCOUNTER — Inpatient Hospital Stay (HOSPITAL_COMMUNITY)

## 2024-01-12 ENCOUNTER — Inpatient Hospital Stay (HOSPITAL_COMMUNITY): Payer: Self-pay | Admitting: Physician Assistant

## 2024-01-12 DIAGNOSIS — I700 Atherosclerosis of aorta: Secondary | ICD-10-CM | POA: Diagnosis present

## 2024-01-12 DIAGNOSIS — I6522 Occlusion and stenosis of left carotid artery: Secondary | ICD-10-CM

## 2024-01-12 DIAGNOSIS — U099 Post covid-19 condition, unspecified: Secondary | ICD-10-CM | POA: Diagnosis present

## 2024-01-12 DIAGNOSIS — Z803 Family history of malignant neoplasm of breast: Secondary | ICD-10-CM

## 2024-01-12 DIAGNOSIS — Z9981 Dependence on supplemental oxygen: Secondary | ICD-10-CM

## 2024-01-12 DIAGNOSIS — Z7982 Long term (current) use of aspirin: Secondary | ICD-10-CM

## 2024-01-12 DIAGNOSIS — J439 Emphysema, unspecified: Secondary | ICD-10-CM | POA: Diagnosis present

## 2024-01-12 DIAGNOSIS — Z822 Family history of deafness and hearing loss: Secondary | ICD-10-CM | POA: Diagnosis not present

## 2024-01-12 DIAGNOSIS — Z87891 Personal history of nicotine dependence: Secondary | ICD-10-CM

## 2024-01-12 DIAGNOSIS — J9610 Chronic respiratory failure, unspecified whether with hypoxia or hypercapnia: Secondary | ICD-10-CM | POA: Diagnosis present

## 2024-01-12 DIAGNOSIS — R0609 Other forms of dyspnea: Secondary | ICD-10-CM | POA: Diagnosis present

## 2024-01-12 DIAGNOSIS — I2510 Atherosclerotic heart disease of native coronary artery without angina pectoris: Secondary | ICD-10-CM | POA: Diagnosis not present

## 2024-01-12 DIAGNOSIS — Z841 Family history of disorders of kidney and ureter: Secondary | ICD-10-CM | POA: Diagnosis not present

## 2024-01-12 DIAGNOSIS — I10 Essential (primary) hypertension: Secondary | ICD-10-CM | POA: Diagnosis not present

## 2024-01-12 DIAGNOSIS — Z8261 Family history of arthritis: Secondary | ICD-10-CM | POA: Diagnosis not present

## 2024-01-12 DIAGNOSIS — I6523 Occlusion and stenosis of bilateral carotid arteries: Principal | ICD-10-CM | POA: Diagnosis present

## 2024-01-12 DIAGNOSIS — Z833 Family history of diabetes mellitus: Secondary | ICD-10-CM

## 2024-01-12 DIAGNOSIS — Z825 Family history of asthma and other chronic lower respiratory diseases: Secondary | ICD-10-CM

## 2024-01-12 DIAGNOSIS — Z8249 Family history of ischemic heart disease and other diseases of the circulatory system: Secondary | ICD-10-CM

## 2024-01-12 DIAGNOSIS — Z801 Family history of malignant neoplasm of trachea, bronchus and lung: Secondary | ICD-10-CM | POA: Diagnosis not present

## 2024-01-12 DIAGNOSIS — Z79620 Long term (current) use of immunosuppressive biologic: Secondary | ICD-10-CM

## 2024-01-12 DIAGNOSIS — I9581 Postprocedural hypotension: Secondary | ICD-10-CM | POA: Diagnosis not present

## 2024-01-12 DIAGNOSIS — Z79899 Other long term (current) drug therapy: Secondary | ICD-10-CM

## 2024-01-12 DIAGNOSIS — Z7902 Long term (current) use of antithrombotics/antiplatelets: Secondary | ICD-10-CM | POA: Diagnosis not present

## 2024-01-12 DIAGNOSIS — Z888 Allergy status to other drugs, medicaments and biological substances status: Secondary | ICD-10-CM | POA: Diagnosis not present

## 2024-01-12 HISTORY — PX: TRANSCAROTID ARTERY REVASCULARIZATIONÂ: SHX6778

## 2024-01-12 LAB — HEMOGLOBIN AND HEMATOCRIT, BLOOD
HCT: 29.4 % — ABNORMAL LOW (ref 36.0–46.0)
Hemoglobin: 9.6 g/dL — ABNORMAL LOW (ref 12.0–15.0)

## 2024-01-12 LAB — ABO/RH: ABO/RH(D): O POS

## 2024-01-12 SURGERY — TRANSCAROTID ARTERY REVASCULARIZATION (TCAR)
Anesthesia: Monitor Anesthesia Care | Laterality: Left

## 2024-01-12 MED ORDER — ONDANSETRON HCL 4 MG/2ML IJ SOLN: 4.0000 mg | Freq: Four times a day (QID) | INTRAMUSCULAR | Status: DC | PRN

## 2024-01-12 MED ORDER — ROCURONIUM BROMIDE 10 MG/ML (PF) SYRINGE
PREFILLED_SYRINGE | INTRAVENOUS | Status: AC
  Filled 2024-01-12: qty 10

## 2024-01-12 MED ORDER — ASPIRIN 81 MG PO TBEC
81.0000 mg | DELAYED_RELEASE_TABLET | Freq: Every day | ORAL | Status: DC
  Administered 2024-01-13: 81 mg via ORAL
  Filled 2024-01-12: qty 1

## 2024-01-12 MED ORDER — FENTANYL CITRATE (PF) 250 MCG/5ML IJ SOLN
INTRAMUSCULAR | Status: AC
  Filled 2024-01-12: qty 5

## 2024-01-12 MED ORDER — PHENOL 1.4 % MT LIQD
1.0000 | OROMUCOSAL | Status: DC | PRN
  Administered 2024-01-12: 1 via OROMUCOSAL

## 2024-01-12 MED ORDER — OXYCODONE HCL 5 MG PO TABS: 5.0000 mg | ORAL_TABLET | Freq: Once | ORAL | Status: DC | PRN

## 2024-01-12 MED ORDER — IODIXANOL 320 MG/ML IV SOLN
INTRAVENOUS | Status: DC | PRN
  Administered 2024-01-12: 42 mL

## 2024-01-12 MED ORDER — CHLORHEXIDINE GLUCONATE 0.12 % MT SOLN
OROMUCOSAL | Status: AC
  Administered 2024-01-12: 15 mL via OROMUCOSAL
  Filled 2024-01-12: qty 15

## 2024-01-12 MED ORDER — LACTATED RINGERS IV SOLN: INTRAVENOUS | Status: DC | PRN

## 2024-01-12 MED ORDER — SODIUM CHLORIDE 0.9 % IV SOLN: INTRAVENOUS | Status: AC

## 2024-01-12 MED ORDER — MORPHINE SULFATE (PF) 2 MG/ML IV SOLN: 2.0000 mg | INTRAVENOUS | Status: DC | PRN

## 2024-01-12 MED ORDER — NOREPINEPHRINE 4 MG/250ML-% IV SOLN
INTRAVENOUS | Status: AC
  Administered 2024-01-12: 2 ug/min via INTRAVENOUS
  Filled 2024-01-12: qty 250

## 2024-01-12 MED ORDER — DEXAMETHASONE SODIUM PHOSPHATE 10 MG/ML IJ SOLN
INTRAMUSCULAR | Status: AC
  Filled 2024-01-12: qty 1

## 2024-01-12 MED ORDER — CLOPIDOGREL BISULFATE 75 MG PO TABS
75.0000 mg | ORAL_TABLET | Freq: Every day | ORAL | Status: DC
  Administered 2024-01-13: 75 mg via ORAL
  Filled 2024-01-12: qty 1

## 2024-01-12 MED ORDER — DOCUSATE SODIUM 100 MG PO CAPS
100.0000 mg | ORAL_CAPSULE | Freq: Every day | ORAL | Status: DC
  Administered 2024-01-13: 100 mg via ORAL
  Filled 2024-01-12: qty 1

## 2024-01-12 MED ORDER — ORAL CARE MOUTH RINSE: 15.0000 mL | Freq: Once | OROMUCOSAL | Status: AC

## 2024-01-12 MED ORDER — SODIUM CHLORIDE 0.9 % IV SOLN: INTRAVENOUS | Status: DC

## 2024-01-12 MED ORDER — BUDESON-GLYCOPYRROL-FORMOTEROL 160-9-4.8 MCG/ACT IN AERO
2.0000 | INHALATION_SPRAY | Freq: Two times a day (BID) | RESPIRATORY_TRACT | Status: DC
  Administered 2024-01-12 – 2024-01-13 (×2): 2 via RESPIRATORY_TRACT
  Filled 2024-01-12: qty 5.9

## 2024-01-12 MED ORDER — DEXMEDETOMIDINE HCL IN NACL 400 MCG/100ML IV SOLN
INTRAVENOUS | Status: DC | PRN
  Administered 2024-01-12: 1 ug/kg/h via INTRAVENOUS

## 2024-01-12 MED ORDER — NITROGLYCERIN 0.4 MG SL SUBL: 0.4000 mg | SUBLINGUAL_TABLET | SUBLINGUAL | Status: DC | PRN

## 2024-01-12 MED ORDER — HEPARIN 6000 UNIT IRRIGATION SOLUTION
Status: DC | PRN
Start: 2024-01-12 — End: 2024-01-12
  Administered 2024-01-12: 1

## 2024-01-12 MED ORDER — PHENYLEPHRINE HCL-NACL 20-0.9 MG/250ML-% IV SOLN: 0.0000 ug/min | INTRAVENOUS | Status: DC

## 2024-01-12 MED ORDER — PROTAMINE SULFATE 10 MG/ML IV SOLN
INTRAVENOUS | Status: DC | PRN
Start: 2024-01-12 — End: 2024-01-12
  Administered 2024-01-12: 50 mg via INTRAVENOUS

## 2024-01-12 MED ORDER — MIDAZOLAM HCL 2 MG/2ML IJ SOLN: 0.5000 mg | Freq: Once | INTRAMUSCULAR | Status: DC | PRN

## 2024-01-12 MED ORDER — 0.9 % SODIUM CHLORIDE (POUR BTL) OPTIME
TOPICAL | Status: DC | PRN
  Administered 2024-01-12: 1000 mL

## 2024-01-12 MED ORDER — LACTATED RINGERS IV SOLN: INTRAVENOUS | Status: DC

## 2024-01-12 MED ORDER — GABAPENTIN 300 MG PO CAPS
600.0000 mg | ORAL_CAPSULE | Freq: Three times a day (TID) | ORAL | Status: DC
  Administered 2024-01-12 – 2024-01-13 (×3): 600 mg via ORAL
  Filled 2024-01-12 (×3): qty 2

## 2024-01-12 MED ORDER — GLYCOPYRROLATE PF 0.2 MG/ML IJ SOSY
PREFILLED_SYRINGE | INTRAMUSCULAR | Status: DC | PRN
  Administered 2024-01-12: .2 mg via INTRAVENOUS

## 2024-01-12 MED ORDER — LIDOCAINE HCL 1 % IJ SOLN
INTRAMUSCULAR | Status: DC | PRN
  Administered 2024-01-12: 8 mL

## 2024-01-12 MED ORDER — CLEVIDIPINE BUTYRATE 0.5 MG/ML IV EMUL
INTRAVENOUS | Status: AC
  Filled 2024-01-12: qty 50

## 2024-01-12 MED ORDER — ALBUTEROL SULFATE (2.5 MG/3ML) 0.083% IN NEBU
3.0000 mL | INHALATION_SOLUTION | Freq: Four times a day (QID) | RESPIRATORY_TRACT | Status: DC | PRN
  Administered 2024-01-13: 3 mL via RESPIRATORY_TRACT
  Filled 2024-01-12: qty 3

## 2024-01-12 MED ORDER — FENTANYL CITRATE (PF) 250 MCG/5ML IJ SOLN
INTRAMUSCULAR | Status: DC | PRN
  Administered 2024-01-12: 25 ug via INTRAVENOUS

## 2024-01-12 MED ORDER — HEPARIN 6000 UNIT IRRIGATION SOLUTION
Status: AC
  Filled 2024-01-12: qty 500

## 2024-01-12 MED ORDER — OXYBUTYNIN CHLORIDE ER 10 MG PO TB24
10.0000 mg | ORAL_TABLET | Freq: Every day | ORAL | Status: DC
  Administered 2024-01-13: 10 mg via ORAL
  Filled 2024-01-12: qty 1

## 2024-01-12 MED ORDER — METOPROLOL TARTRATE 5 MG/5ML IV SOLN: 2.5000 mg | INTRAVENOUS | Status: DC | PRN

## 2024-01-12 MED ORDER — ATORVASTATIN CALCIUM 10 MG PO TABS: 20.0000 mg | ORAL_TABLET | Freq: Every day | ORAL | Status: DC

## 2024-01-12 MED ORDER — ALBUMIN HUMAN 5 % IV SOLN
12.5000 g | Freq: Once | INTRAVENOUS | Status: AC
  Administered 2024-01-12: 12.5 g via INTRAVENOUS

## 2024-01-12 MED ORDER — ACETAMINOPHEN 650 MG RE SUPP: 325.0000 mg | RECTAL | Status: DC | PRN

## 2024-01-12 MED ORDER — LACTATED RINGERS IV BOLUS
500.0000 mL | Freq: Once | INTRAVENOUS | Status: AC
  Administered 2024-01-12: 500 mL via INTRAVENOUS

## 2024-01-12 MED ORDER — SODIUM CHLORIDE 0.9 % IV SOLN
250.0000 mL | INTRAVENOUS | Status: AC
  Administered 2024-01-12: 250 mL via INTRAVENOUS

## 2024-01-12 MED ORDER — SODIUM CHLORIDE 3 % IN NEBU
4.0000 mL | INHALATION_SOLUTION | Freq: Two times a day (BID) | RESPIRATORY_TRACT | Status: DC
  Administered 2024-01-12 – 2024-01-13 (×2): 4 mL via RESPIRATORY_TRACT
  Filled 2024-01-12 (×2): qty 4

## 2024-01-12 MED ORDER — CHLORHEXIDINE GLUCONATE CLOTH 2 % EX PADS: 6.0000 | MEDICATED_PAD | Freq: Once | CUTANEOUS | Status: DC

## 2024-01-12 MED ORDER — CEFAZOLIN SODIUM-DEXTROSE 2-4 GM/100ML-% IV SOLN
2.0000 g | INTRAVENOUS | Status: AC
  Administered 2024-01-12: 2 g via INTRAVENOUS
  Filled 2024-01-12: qty 100

## 2024-01-12 MED ORDER — VASOPRESSIN 20 UNIT/ML IV SOLN
INTRAVENOUS | Status: AC
  Filled 2024-01-12: qty 1

## 2024-01-12 MED ORDER — PHENYLEPHRINE HCL-NACL 20-0.9 MG/250ML-% IV SOLN: 25.0000 ug/min | INTRAVENOUS | Status: DC

## 2024-01-12 MED ORDER — SODIUM CHLORIDE 3 % IN NEBU: 4.0000 mL | INHALATION_SOLUTION | Freq: Two times a day (BID) | RESPIRATORY_TRACT | Status: DC

## 2024-01-12 MED ORDER — OXYCODONE HCL 5 MG/5ML PO SOLN: 5.0000 mg | Freq: Once | ORAL | Status: DC | PRN

## 2024-01-12 MED ORDER — ONDANSETRON HCL 4 MG/2ML IJ SOLN
INTRAMUSCULAR | Status: AC
  Filled 2024-01-12: qty 2

## 2024-01-12 MED ORDER — SURGIFLO WITH THROMBIN (HEMOSTATIC MATRIX KIT) OPTIME
TOPICAL | Status: DC | PRN
  Administered 2024-01-12: 1 via TOPICAL

## 2024-01-12 MED ORDER — PHENYLEPHRINE 80 MCG/ML (10ML) SYRINGE FOR IV PUSH (FOR BLOOD PRESSURE SUPPORT)
PREFILLED_SYRINGE | INTRAVENOUS | Status: AC
Start: 2024-01-12 — End: 2024-01-12
  Filled 2024-01-12: qty 10

## 2024-01-12 MED ORDER — LIDOCAINE 2% (20 MG/ML) 5 ML SYRINGE
INTRAMUSCULAR | Status: AC
  Filled 2024-01-12: qty 5

## 2024-01-12 MED ORDER — ATORVASTATIN CALCIUM 80 MG PO TABS
80.0000 mg | ORAL_TABLET | Freq: Every day | ORAL | Status: DC
  Administered 2024-01-13: 80 mg via ORAL
  Filled 2024-01-12: qty 1

## 2024-01-12 MED ORDER — ACETAMINOPHEN 325 MG PO TABS: 325.0000 mg | ORAL_TABLET | ORAL | Status: DC | PRN

## 2024-01-12 MED ORDER — ISOSORBIDE MONONITRATE ER 30 MG PO TB24: 30.0000 mg | ORAL_TABLET | ORAL | Status: DC

## 2024-01-12 MED ORDER — GLYCOPYRROLATE PF 0.2 MG/ML IJ SOSY
PREFILLED_SYRINGE | INTRAMUSCULAR | Status: AC
  Filled 2024-01-12: qty 1

## 2024-01-12 MED ORDER — SODIUM CHLORIDE 0.9 % IV SOLN
500.0000 mL | Freq: Once | INTRAVENOUS | Status: AC | PRN
Start: 2024-01-12 — End: 2024-01-12
  Administered 2024-01-12: 500 mL via INTRAVENOUS

## 2024-01-12 MED ORDER — BISACODYL 10 MG RE SUPP
10.0000 mg | Freq: Every day | RECTAL | Status: DC | PRN
Start: 2024-01-12 — End: 2024-01-13

## 2024-01-12 MED ORDER — ROFLUMILAST 500 MCG PO TABS
500.0000 ug | ORAL_TABLET | Freq: Every day | ORAL | Status: DC
  Administered 2024-01-13: 500 ug via ORAL
  Filled 2024-01-12: qty 1

## 2024-01-12 MED ORDER — ONDANSETRON HCL 4 MG/2ML IJ SOLN
INTRAMUSCULAR | Status: DC | PRN
  Administered 2024-01-12: 4 mg via INTRAVENOUS

## 2024-01-12 MED ORDER — CYCLOBENZAPRINE HCL 10 MG PO TABS: 5.0000 mg | ORAL_TABLET | Freq: Every evening | ORAL | Status: DC | PRN

## 2024-01-12 MED ORDER — BENZONATATE 100 MG PO CAPS: 200.0000 mg | ORAL_CAPSULE | Freq: Three times a day (TID) | ORAL | Status: DC | PRN

## 2024-01-12 MED ORDER — ACETAMINOPHEN 500 MG PO TABS
1000.0000 mg | ORAL_TABLET | Freq: Once | ORAL | Status: AC
  Administered 2024-01-12: 1000 mg via ORAL
  Filled 2024-01-12: qty 2

## 2024-01-12 MED ORDER — HYDRALAZINE HCL 20 MG/ML IJ SOLN: 5.0000 mg | INTRAMUSCULAR | Status: DC | PRN

## 2024-01-12 MED ORDER — PHENYLEPHRINE HCL-NACL 20-0.9 MG/250ML-% IV SOLN
INTRAVENOUS | Status: DC | PRN
  Administered 2024-01-12: 25 ug/min via INTRAVENOUS

## 2024-01-12 MED ORDER — PROPOFOL 10 MG/ML IV BOLUS
INTRAVENOUS | Status: AC
  Filled 2024-01-12: qty 20

## 2024-01-12 MED ORDER — EPHEDRINE 5 MG/ML INJ
INTRAVENOUS | Status: AC
  Filled 2024-01-12: qty 5

## 2024-01-12 MED ORDER — HEPARIN SODIUM (PORCINE) 1000 UNIT/ML IJ SOLN
INTRAMUSCULAR | Status: DC | PRN
  Administered 2024-01-12: 8000 [IU] via INTRAVENOUS

## 2024-01-12 MED ORDER — METOPROLOL SUCCINATE ER 25 MG PO TB24: 25.0000 mg | ORAL_TABLET | Freq: Every morning | ORAL | Status: DC

## 2024-01-12 MED ORDER — GABAPENTIN 600 MG PO TABS: 600.0000 mg | ORAL_TABLET | Freq: Three times a day (TID) | ORAL | Status: DC

## 2024-01-12 MED ORDER — POLYETHYLENE GLYCOL 3350 17 G PO PACK: 17.0000 g | PACK | Freq: Every day | ORAL | Status: DC | PRN

## 2024-01-12 MED ORDER — HYDROXYZINE HCL 25 MG PO TABS
25.0000 mg | ORAL_TABLET | Freq: Every day | ORAL | Status: DC
  Administered 2024-01-12: 25 mg via ORAL
  Filled 2024-01-12: qty 1

## 2024-01-12 MED ORDER — CEFAZOLIN SODIUM-DEXTROSE 2-4 GM/100ML-% IV SOLN
2.0000 g | Freq: Three times a day (TID) | INTRAVENOUS | Status: AC
  Administered 2024-01-12 (×2): 2 g via INTRAVENOUS
  Filled 2024-01-12 (×2): qty 100

## 2024-01-12 MED ORDER — OXYCODONE-ACETAMINOPHEN 5-325 MG PO TABS: 1.0000 | ORAL_TABLET | ORAL | Status: DC | PRN

## 2024-01-12 MED ORDER — PROPOFOL 500 MG/50ML IV EMUL
INTRAVENOUS | Status: DC | PRN
  Administered 2024-01-12: 50 ug/kg/min via INTRAVENOUS

## 2024-01-12 MED ORDER — CHLORHEXIDINE GLUCONATE 0.12 % MT SOLN: 15.0000 mL | Freq: Once | OROMUCOSAL | Status: AC

## 2024-01-12 MED ORDER — ADULT MULTIVITAMIN W/MINERALS CH
1.0000 | ORAL_TABLET | Freq: Every day | ORAL | Status: DC
  Administered 2024-01-13: 1 via ORAL
  Filled 2024-01-12: qty 1

## 2024-01-12 MED ORDER — EPHEDRINE SULFATE-NACL 50-0.9 MG/10ML-% IV SOSY
PREFILLED_SYRINGE | INTRAVENOUS | Status: DC | PRN
  Administered 2024-01-12: 5 mg via INTRAVENOUS

## 2024-01-12 MED ORDER — ALBUMIN HUMAN 5 % IV SOLN
INTRAVENOUS | Status: AC
Start: 2024-01-12 — End: 2024-01-12
  Filled 2024-01-12: qty 250

## 2024-01-12 MED ORDER — NOREPINEPHRINE 4 MG/250ML-% IV SOLN: 0.0000 ug/min | INTRAVENOUS | Status: DC

## 2024-01-12 MED ORDER — ESCITALOPRAM OXALATE 10 MG PO TABS
20.0000 mg | ORAL_TABLET | Freq: Every day | ORAL | Status: DC
  Administered 2024-01-13: 20 mg via ORAL
  Filled 2024-01-12: qty 2

## 2024-01-12 MED ORDER — HYDROXYZINE PAMOATE 25 MG PO CAPS
25.0000 mg | ORAL_CAPSULE | Freq: Every day | ORAL | Status: DC
  Filled 2024-01-12: qty 1

## 2024-01-12 MED ORDER — MULTI-VITAMIN/MINERALS PO TABS: 1.0000 | ORAL_TABLET | Freq: Every day | ORAL | Status: DC

## 2024-01-12 MED ORDER — ATROPINE SULFATE 0.4 MG/ML IV SOLN
INTRAVENOUS | Status: AC
Start: 2024-01-12 — End: 2024-01-12
  Filled 2024-01-12: qty 1

## 2024-01-12 MED ORDER — CHLORHEXIDINE GLUCONATE CLOTH 2 % EX PADS
6.0000 | MEDICATED_PAD | Freq: Every day | CUTANEOUS | Status: DC
  Administered 2024-01-12 – 2024-01-13 (×2): 6 via TOPICAL

## 2024-01-12 MED ORDER — LIDOCAINE HCL (PF) 1 % IJ SOLN
INTRAMUSCULAR | Status: AC
  Filled 2024-01-12: qty 30

## 2024-01-12 MED ORDER — FENTANYL CITRATE (PF) 100 MCG/2ML IJ SOLN: 25.0000 ug | INTRAMUSCULAR | Status: DC | PRN

## 2024-01-12 MED ORDER — POTASSIUM CHLORIDE CRYS ER 20 MEQ PO TBCR: 40.0000 meq | EXTENDED_RELEASE_TABLET | Freq: Every day | ORAL | Status: DC | PRN

## 2024-01-12 SURGICAL SUPPLY — 40 items
BAG BANDED W/RUBBER/TAPE 36X54 (MISCELLANEOUS) ×1 IMPLANT
BAG COUNTER SPONGE SURGICOUNT (BAG) ×1 IMPLANT
CANISTER SUCTION 3000ML PPV (SUCTIONS) ×1 IMPLANT
CATH BALLN ENROUTE 5X35 (CATHETERS) IMPLANT
CLIP TI MEDIUM 6 (CLIP) ×1 IMPLANT
CLIP TI WIDE RED SMALL 6 (CLIP) ×1 IMPLANT
COVER DOME SNAP 22 D (MISCELLANEOUS) ×1 IMPLANT
COVER PROBE W GEL 5X96 (DRAPES) ×1 IMPLANT
DERMABOND ADVANCED .7 DNX12 (GAUZE/BANDAGES/DRESSINGS) ×1 IMPLANT
DRAPE FEMORAL ANGIO 80X135IN (DRAPES) ×1 IMPLANT
ELECTRODE REM PT RTRN 9FT ADLT (ELECTROSURGICAL) ×1 IMPLANT
GAUZE KITTNER 1.5X5 (MISCELLANEOUS) IMPLANT
GLOVE SURG SS PI 7.5 STRL IVOR (GLOVE) ×3 IMPLANT
GOWN STRL REUS W/ TWL LRG LVL3 (GOWN DISPOSABLE) ×2 IMPLANT
GOWN STRL REUS W/ TWL XL LVL3 (GOWN DISPOSABLE) ×1 IMPLANT
GUIDEWIRE ENROUTE 0.014 (WIRE) ×1 IMPLANT
HEMOSTAT SNOW SURGICEL 2X4 (HEMOSTASIS) IMPLANT
KIT BASIN OR (CUSTOM PROCEDURE TRAY) ×1 IMPLANT
KIT ENCORE 26 ADVANTAGE (KITS) ×1 IMPLANT
KIT INTRODUCER GALT 7 (INTRODUCER) ×1 IMPLANT
KIT TURNOVER KIT B (KITS) ×1 IMPLANT
NDL HYPO 25GX1X1/2 BEV (NEEDLE) IMPLANT
NEEDLE HYPO 25GX1X1/2 BEV (NEEDLE) IMPLANT
PACK CAROTID (CUSTOM PROCEDURE TRAY) ×1 IMPLANT
POSITIONER HEAD DONUT 9IN (MISCELLANEOUS) ×1 IMPLANT
SET MICROPUNCTURE 5F STIFF (MISCELLANEOUS) ×1 IMPLANT
STENT TRANSCAROTID SYSTEM 8X40 (Permanent Stent) IMPLANT
SURGIFLO W/THROMBIN 8M KIT (HEMOSTASIS) IMPLANT
SUT PROLENE 5 0 C 1 24 (SUTURE) ×2 IMPLANT
SUT SILK 2 0 PERMA HAND 18 BK (SUTURE) ×1 IMPLANT
SUT SILK 2 0 SH (SUTURE) ×1 IMPLANT
SUT VIC AB 3-0 SH 27X BRD (SUTURE) ×2 IMPLANT
SUT VIC AB 4-0 PS2 27 (SUTURE) ×1 IMPLANT
SYR 10ML LL (SYRINGE) ×3 IMPLANT
SYR 20ML LL LF (SYRINGE) ×1 IMPLANT
SYR CONTROL 10ML LL (SYRINGE) IMPLANT
SYSTEM TRANSCAROTID NEUROPRTCT (MISCELLANEOUS) ×1 IMPLANT
TOWEL GREEN STERILE (TOWEL DISPOSABLE) ×1 IMPLANT
WATER STERILE IRR 1000ML POUR (IV SOLUTION) ×1 IMPLANT
WIRE BENTSON .035X145CM (WIRE) ×1 IMPLANT

## 2024-01-12 NOTE — Anesthesia Procedure Notes (Signed)
 Arterial Line Insertion Start/End6/27/2025 7:00 AM, 01/12/2024 7:05 AM Performed by: Leonce Athens, MD, Mannie Krystal LABOR, CRNA, CRNA  Patient location: PACU. Preanesthetic checklist: patient identified, IV checked, site marked, risks and benefits discussed, surgical consent, monitors and equipment checked, pre-op evaluation, timeout performed and anesthesia consent Lidocaine  1% used for infiltration Left, radial was placed Catheter size: 20 G Hand hygiene performed , maximum sterile barriers used  and Seldinger technique used Allen's test indicative of satisfactory collateral circulation Attempts: 1 Procedure performed using ultrasound guided technique. Ultrasound Notes:anatomy identified, needle tip was noted to be adjacent to the nerve/plexus identified and no ultrasound evidence of intravascular and/or intraneural injection Following insertion, Biopatch. Post procedure assessment: normal  Patient tolerated the procedure well with no immediate complications.

## 2024-01-12 NOTE — Interval H&P Note (Signed)
 History and Physical Interval Note:  01/12/2024 7:31 AM  Maria [name]  has presented today for surgery, with the diagnosis of carotid artery stenosis - bilateral.  The various methods of treatment have been discussed with the patient and family. After consideration of risks, benefits and other options for treatment, the patient has consented to  Procedure(s): TRANSCAROTID ARTERY REVASCULARIZATION (TCAR) (Left) as a surgical intervention.  The patient's history has been reviewed, patient examined, no change in status, stable for surgery.  I have reviewed the patient's chart and labs.  Questions were answered to the patient's satisfaction.     Malvina New

## 2024-01-12 NOTE — Progress Notes (Signed)
 s/p L TCAR Admitted to ICU due to low BP and HR, requiring Neo Neuro intact Small left neck hematoma  -try levophed to see if get a better BP and HR response -ok to d/c once HR and BP issues resolved    Maria [name]

## 2024-01-12 NOTE — Anesthesia Postprocedure Evaluation (Signed)
 Anesthesia Post Note  Patient: Maria [name]  Procedure(s) Performed: TRANSCAROTID ARTERY REVASCULARIZATION (TCAR) (Left)     Patient location during evaluation: PACU Anesthesia Type: MAC Level of consciousness: awake and alert Pain management: pain level controlled Vital Signs Assessment: post-procedure vital signs reviewed and stable Respiratory status: spontaneous breathing, nonlabored ventilation, respiratory function stable and patient connected to nasal cannula oxygen  Cardiovascular status: stable and blood pressure returned to baseline Postop Assessment: no apparent nausea or vomiting Anesthetic complications: no   No notable events documented.  Last Vitals:  Vitals:   01/12/24 1253 01/12/24 1300  BP: (!) 146/54 (!) 128/52  Pulse: (!) 53 (!) 51  Resp: 17 16  Temp:  36.6 C  SpO2: 93% 96%    Last Pain:  Vitals:   01/12/24 1215  TempSrc:   PainSc: 0-No pain                 Yee Gangi S

## 2024-01-12 NOTE — Telephone Encounter (Signed)
 [place] and d/w her surgeon. She is doing well

## 2024-01-12 NOTE — Op Note (Signed)
 Patient name: Maria [name] MRN: [id number] DOB: 03-14-1956 Sex: female  01/12/2024 Pre-operative Diagnosis:?  Symptomatic left carotid stenosis Post-operative diagnosis:  Same Surgeon:  Malvina New Assistants:  Lucie Apt, PA Procedure:   #1: Left carotid stent (TCAR)   #2: Retrograde flow reversal neuroprotection   #3: Ultrasound-guided right common femoral vein access Anesthesia:  MAC Blood Loss:  minimal Specimens:  none  Stent: ENROUTE 8 x 40 Predilation balloon: 5 x 3.5 Postdilation balloon: 5 x 3.5 Contrast: 42 cc Flow reversal time: 10 minutes Fluoroscopy time: 3 minutes Procedure time: 63 minutes Dose area: One 144.18  Indications: This is a 68 year old female with history of high-grade left carotid stenosis.  She underwent an attempt at transfemoral stenting however the lesion could not be crossed.  She was referred for TCAR.  She describes some dizziness and right sided numbness.  She is on 5 L of oxygen  and is a very poor candidate for general anesthesia.  Therefore we discussed left-sided TCAR.  Procedure:  The patient was identified in the holding area and taken to [facility] OR ROOM 16  The patient was then placed supine on the table. MAC anesthesia was administered.  The patient was prepped and draped in the usual sterile fashion.  A time out was called and antibiotics were administered.  A PA was necessary to expedite the procedure and assist with technical details.  She helped with exposure by providing suction and retraction, wire exchanges, and sheath removal  Ultrasound was used to evaluate the right common femoral vein which was widely patent and easily compressible.  It was cannulated under ultrasound guidance with a micropuncture needle.  A 018 wire was inserted followed by placement of a micropuncture sheath.  Next, a 035 wire was placed followed by the TCAR sheath  Attention was then turned towards the left neck.  A transverse incision was made just above the  clavicle.  Cautery was used to divide the subcutaneous tissue and platysma muscle.  I then identified the 2 heads of the sternocleidomastoid and developed an avascular plane between them.  The carotid was below the omohyoid muscle and so this had to be partially cauterized in order to expose the carotid.  Once the carotid was exposed, it was fully mobilized and encircled with a vessel loop and umbilical tape.  The patient was fully heparinized.  Next, a 5-0 Prolene U-stitch was placed on the anterior surface of the carotid.  The carotid was then cannulated with a micropuncture needle.  A 018 wire was then inserted followed by placing the micropuncture sheath.  A contrast injection was then performed locating the bifurcation which was marked on the screen.  A carotid Amplatz wire was then inserted and the TCAR sheath was placed.  This was sutured to the skin with 2-0 silk.  The flow reversal tubing was then connected and passive flow reversal was initiated.  A TCAR timeout was called.  ACT was greater than 250 and hemodynamics were appropriate.  Active flow reversal was initiated by tightening the vessel loop.  This was confirmed with a saline flush.  Injection was then performed to better define her anatomy.  I then advanced a 014 wire easily across the bifurcation into the distal internal carotid artery.  I then selected a 5 x 3.5 balloon and performed predilation.  The patient had minimal hemodynamic response.  I then removed the balloon and inserted the stent which was an 8 x 40 stent.  This was deployed across  the lesion.  I waited 2 minutes and performed completion imaging.  I elected to post dilate with a 5 x 3.5 balloon which did not have any waist on inflation.  I waited another 2 minutes and performed final imaging.  I was satisfied that there was no residual stenosis.  The wires were then removed.  Flow reversal was discontinued.  The tubing was then disconnected and blood returned to the sheath in the  right groin.  The TCAR sheath in the carotid was removed and the Prolene was used to secure the arteriotomy site.  Hand-held Doppler was used to evaluate the signals in the carotid which were normal.  I then reversed the heparin  with 50 mg of protamine.  The sheath in the groin was removed and manual pressure was held for hemostasis.  Once the neck was hemostatic, the platysma muscle was reapproximated 3-0 Vicryl and the skin was closed with 4-0 Vicryl.  Dermabond was placed on the incisions.  The patient was moving all 4 extremities to command.   Disposition: To PACU stable.   ALONSO Malvina New, M.D., [facility] [place] Office: [phone] Pager:  [phone]

## 2024-01-12 NOTE — Transfer of Care (Signed)
 Immediate Anesthesia Transfer of Care Note  Patient: Maria [name]  Procedure(s) Performed: LERETHA ARTERY REVASCULARIZATION (TCAR) (Left)  Patient Location: PACU  Anesthesia Type:MAC  Level of Consciousness: awake, alert , and oriented  Airway & Oxygen  Therapy: Patient Spontanous Breathing and Patient connected to face mask oxygen   Post-op Assessment: Report given to RN and Post -op Vital signs reviewed and stable  Post vital signs: Reviewed and stable  Last Vitals:  Vitals Value Taken Time  BP 121/56 01/12/24 09:57  Temp    Pulse 48 01/12/24 10:00  Resp 15 01/12/24 10:00  SpO2 97 % 01/12/24 10:00  Vitals shown include unfiled device data.  Last Pain:  Vitals:   01/12/24 0612  TempSrc:   PainSc: 0-No pain         Complications: No notable events documented.

## 2024-01-12 NOTE — Discharge Instructions (Signed)

## 2024-01-12 NOTE — Progress Notes (Signed)
 PHARMACIST LIPID MONITORING   Maria [name] is a 68 y.o. female admitted on 01/12/2024 s/p TCAR.  Pharmacy has been consulted to optimize lipid-lowering therapy with the indication of secondary prevention for clinical ASCVD.  Recent Labs:  Lipid Panel (last 6 months):   Lab Results  Component Value Date   CHOL 151 11/22/2023   TRIG 86 11/22/2023   HDL 70 11/22/2023   CHOLHDL 2.2 11/22/2023   LDLCALC 65 11/22/2023    Hepatic function panel (last 6 months):   Lab Results  Component Value Date   AST 25 01/11/2024   ALT 19 01/11/2024   ALKPHOS 68 01/11/2024   BILITOT 0.5 01/11/2024    SCr (since admission):   Serum creatinine: 0.75 mg/dL 93/73/74 8985 Estimated creatinine clearance: 72.8 mL/min  Current therapy and lipid therapy tolerance Current lipid-lowering therapy: atorvastatin  20mg /d Documented or reported allergies or intolerances to lipid-lowering therapies (if applicable): none    Plan:    1.Statin intensity (high intensity recommended for all patients regardless of the LDL):  Add or increase statin to high intensity.  2.Add ezetimibe (if any one of the following):   Not indicated at this time.  3.Refer to lipid clinic:   No  4.Follow-up with:  Primary care provider - Maria Lynwood HERO, NP  5.Follow-up labs after discharge:  Changes in lipid therapy were made. Check a lipid panel in 8-12 weeks then annually.      Prentice Poisson, PharmD Clinical Pharmacist **Pharmacist phone directory can now be found on [email or web address] ([email or web address]).  Listed under [facility].

## 2024-01-13 LAB — CBC
HCT: 29.2 % — ABNORMAL LOW (ref 36.0–46.0)
Hemoglobin: 9.6 g/dL — ABNORMAL LOW (ref 12.0–15.0)
MCH: 30.7 pg (ref 26.0–34.0)
MCHC: 32.9 g/dL (ref 30.0–36.0)
MCV: 93.3 fL (ref 80.0–100.0)
Platelets: 275 10*3/uL (ref 150–400)
RBC: 3.13 MIL/uL — ABNORMAL LOW (ref 3.87–5.11)
RDW: 13.4 % (ref 11.5–15.5)
WBC: 10.6 10*3/uL — ABNORMAL HIGH (ref 4.0–10.5)
nRBC: 0 % (ref 0.0–0.2)

## 2024-01-13 LAB — BASIC METABOLIC PANEL WITH GFR
Anion gap: 8 (ref 5–15)
BUN: 9 mg/dL (ref 8–23)
CO2: 25 mmol/L (ref 22–32)
Calcium: 8.5 mg/dL — ABNORMAL LOW (ref 8.9–10.3)
Chloride: 107 mmol/L (ref 98–111)
Creatinine, Ser: 0.6 mg/dL (ref 0.44–1.00)
GFR, Estimated: >60 mL/min (ref >60)
Glucose, Bld: 114 mg/dL — ABNORMAL HIGH (ref 70–99)
Potassium: 3.8 mmol/L (ref 3.5–5.1)
Sodium: 140 mmol/L (ref 135–145)

## 2024-01-13 LAB — LIPID PANEL
Cholesterol: 125 mg/dL (ref 0–200)
HDL: 61 mg/dL (ref >40)
LDL Cholesterol: 56 mg/dL (ref 0–99)
Total CHOL/HDL Ratio: 2 ratio
Triglycerides: 41 mg/dL (ref <150)
VLDL: 8 mg/dL (ref 0–40)

## 2024-01-13 LAB — POCT ACTIVATED CLOTTING TIME: Activated Clotting Time: 256 s

## 2024-01-13 MED ORDER — OXYCODONE-ACETAMINOPHEN 5-325 MG PO TABS: 1.0000 | ORAL_TABLET | ORAL | 0 refills | Status: DC | PRN

## 2024-01-13 NOTE — Progress Notes (Signed)
 A-line becoming more positional.  Flushes sluggishly. Hand warm and dry.

## 2024-01-13 NOTE — Plan of Care (Signed)
  Problem: Education: Goal: Knowledge of General Education information will improve Description: Including pain rating scale, medication(s)/side effects and non-pharmacologic comfort measures Outcome: Adequate for Discharge   Problem: Health Behavior/Discharge Planning: Goal: Ability to manage health-related needs will improve Outcome: Adequate for Discharge   Problem: Clinical Measurements: Goal: Ability to maintain clinical measurements within normal limits will improve Outcome: Adequate for Discharge Goal: Will remain free from infection Outcome: Adequate for Discharge Goal: Diagnostic test results will improve Outcome: Adequate for Discharge Goal: Respiratory complications will improve Outcome: Adequate for Discharge Goal: Cardiovascular complication will be avoided Outcome: Adequate for Discharge   Problem: Activity: Goal: Risk for activity intolerance will decrease Outcome: Adequate for Discharge   Problem: Nutrition: Goal: Adequate nutrition will be maintained Outcome: Adequate for Discharge   Problem: Coping: Goal: Level of anxiety will decrease Outcome: Adequate for Discharge   Problem: Elimination: Goal: Will not experience complications related to bowel motility Outcome: Adequate for Discharge Goal: Will not experience complications related to urinary retention Outcome: Adequate for Discharge   Problem: Pain Managment: Goal: General experience of comfort will improve and/or be controlled Outcome: Adequate for Discharge   Problem: Safety: Goal: Ability to remain free from injury will improve Outcome: Adequate for Discharge   Problem: Skin Integrity: Goal: Risk for impaired skin integrity will decrease Outcome: Adequate for Discharge   Problem: Education: Goal: Knowledge of discharge needs will improve Outcome: Adequate for Discharge   Problem: Clinical Measurements: Goal: Postoperative complications will be avoided or minimized Outcome: Adequate for  Discharge   Problem: Respiratory: Goal: Will achieve and/or maintain a regular respiratory rate, without signs or symptoms of dyspnea Outcome: Adequate for Discharge   Problem: Skin Integrity: Goal: Demonstration of wound healing without infection will improve Outcome: Adequate for Discharge

## 2024-01-13 NOTE — Progress Notes (Signed)
  Daily Progress Note  S/p: Left TCAR  Subjective: Feeling well this morning, explained she has reviewed home.  Off pressors this morning Cuff pressure 120  Objective: Vitals:   01/13/24 0640 01/13/24 0700  BP:    Pulse: 67   Resp: (!) 23   Temp:  (!) 96.7 F (35.9 C)  SpO2: 94%     Physical Examination HDS, off pressors Nonlabored breathing Neuro intact, moving all extremities to command, tongue midline Left neck incision with some ecchymosis, no drainage, small left neck hematoma.  CBC and BMP reviewed.  ASSESSMENT/PLAN:  68 year old female who underwent left TCAR yesterday for symptomatic left-sided stenosis.  She was admitted to the ICU postoperatively due to bradycardia and hypotension with pressor requirement.  This morning she is completely off all pressors.  Her Cuff pressures have been greater than 120 throughout the morning. Will plan to remove A-line this morning and ambulate. If no hypotension or bradycardia through the morning can plan for afternoon discharge. Will continue aspirin  and Plavix  as well as her home medications.   Norman GORMAN Serve MD [facility] [phone] 01/13/2024  7:45 AM

## 2024-01-13 NOTE — Progress Notes (Signed)
 Title: A phase III Open-Label extension study to evaluate the long-term safety of Astegolimab in patiens with Chronic Obstructive Pulmonary disease.   Dose and Duration of Treatment: Astegolimab is presented as a sterile, slightly brown-yellow solution. Each single-use, 2.25 mL pre-filled syringe contains 1.7 mL deliverable volume. Astegolimab drug product is formulated at 140 mg/mL astegolimab with 114 mM succinic acid, 200 mM L-arginine, 10 mM L-methionine, 0.06% (w/v) polysorbate 20, pH 5.7. Placebo for astegolimab is supplied in an identical pre-filled syringe configuration.   Protocol # [id number] Sponsor: [employer] [address], [place]     .xxxxx This visit for Subject Maria [name] with DOB: [birth year] on 01/03/2024 for the above protocol is Visit/Encounter # reseatr  and is for purpose of inectivn sivit . Subject/LAR expressed continued interest and consent in continuing as a study subject. Subject thanked for participation in research and contribution to science.    LATE ENTRY  No new issues Study drug was given by PI on LLQ and RLQ    SIGNATURE - late entry    Dr. Dorethia Cave, M.D., F.C.C.P, ACRP-CPI Pulmonary and Critical Care Medicine Research Investigator, [facility] @ [facility] Health Staff Physician, [facility] Center Director - Interstitial Lung Disease  Program  Pulmonary Fibrosis [facility] and [facility] @ [facility] [place], [place], [address]   Pager: [phone], If no answer  OR between  19:00-7:00h: page [phone] Telephone (research): [phone]  4:32 PM 01/13/2024   4:32 PM 01/13/2024

## 2024-01-13 NOTE — Plan of Care (Signed)

## 2024-01-15 ENCOUNTER — Encounter (HOSPITAL_COMMUNITY): Payer: Self-pay | Admitting: Surgery

## 2024-01-16 NOTE — Discharge Summary (Signed)
 Physician Discharge Summary  Patient ID: Maria [name] MRN: [id number] DOB/AGE: 18-Jul-1956 68 y.o.  Admit date: 01/12/2024 Discharge date: 01/16/2024  Admission Diagnoses:  Discharge Diagnoses:  Principal Problem:   Bilateral carotid artery occlusion Active Problems:   Symptomatic carotid artery stenosis, left   Discharged Condition: stable  Hospital Course:  Presented for elective left TCAR for symptomatic L ICA stenosis. Was mildly hypotensive postoperatively requiring ICU stay and low dose pressors overnight. Recovered by the following morning. Was off pressors with RRR and SBP > 120. Tolerated breakfast and lunch and walked with some SOB which is her baseline.  Dc'd home with plan to restart BP meds once her home BP checks reach > 140.  Consults: None  Treatments: surgery: TCAR  Discharge Exam: Blood pressure (!) 140/50, pulse 73, temperature 98 F (36.7 C), temperature source Axillary, resp. rate (!) 22, height 5' 7 (1.702 m), weight 74.4 kg, SpO2 99%.  HDS, off pressors Nonlabored breathing Neuro intact, moving all extremities to command, tongue midline Left neck incision with some ecchymosis, no drainage, small left neck hematoma.   Disposition: Discharge disposition: 01-Home or Self Care        Allergies as of 01/13/2024       Reactions   Montelukast     Bad dreams        Medication List     PAUSE taking these medications    isosorbide  mononitrate 30 MG 24 hr tablet Wait to take this until your doctor or other care provider tells you to start again. Commonly known as: IMDUR  Take 1 tablet (30 mg total) by mouth every morning.   metoprolol  succinate 25 MG 24 hr tablet Wait to take this until your doctor or other care provider tells you to start again. Commonly known as: TOPROL -XL Take 1 tablet (25 mg total) by mouth every evening. Take with or immediately following a meal. What changed: when to take this       TAKE these medications     albuterol  108 (90 Base) MCG/ACT inhaler Commonly known as: VENTOLIN  HFA TAKE 2 PUFFS BY MOUTH EVERY 6 HOURS AS NEEDED FOR WHEEZE OR SHORTNESS OF BREATH   Aspirin  Low Dose 81 MG tablet Generic drug: aspirin  EC TAKE 1 TABLET BY MOUTH EVERY DAY   atorvastatin  20 MG tablet Commonly known as: LIPITOR Take 1 tablet (20 mg total) by mouth daily.   Azelastine  HCl 137 MCG/SPRAY Soln USE 1 SPRAY INTO EACH NOSTRIL TWICE A DAY   azithromycin  250 MG tablet Commonly known as: ZITHROMAX  Azithromycin  500mg  day 1 and then 250mg  daily x next 4 days   benzonatate  200 MG capsule Commonly known as: TESSALON  Take 1 capsule by mouth three times daily as needed   Breztri  Aerosphere 160-9-4.8 MCG/ACT Aero inhaler Generic drug: budesonide -glycopyrrolate-formoterol  Inhale 2 puffs into the lungs in the morning and at bedtime.   clopidogrel  75 MG tablet Commonly known as: Plavix  Take 1 tablet (75 mg total) by mouth daily.   cyclobenzaprine  5 MG tablet Commonly known as: FLEXERIL  TAKE 1 TABLET BY MOUTH AT BEDTIME AS NEEDED FOR MUSCLE SPASM   escitalopram  20 MG tablet Commonly known as: LEXAPRO  Take 1 tablet by mouth once daily   Flutter Devi Use as directed   gabapentin  600 MG tablet Commonly known as: Neurontin  Take 1 tablet (600 mg total) by mouth every 8 (eight) hours.   hydrOXYzine  25 MG capsule Commonly known as: VISTARIL  TAKE 1 CAPSULE BY MOUTH AT BEDTIME AS NEEDED What changed: when to take  this   ipratropium-albuterol  0.5-2.5 (3) MG/3ML Soln Commonly known as: DUONEB USE 1 AMPULE IN NEBULIZER 4 TIMES DAILY AS NEEDED   methocarbamol  500 MG tablet Commonly known as: ROBAXIN  Take 1 tablet (500 mg total) by mouth at bedtime as needed for muscle spasms.   multivitamin with minerals tablet Take 1 tablet by mouth daily. Alive Woman 50+   nitroGLYCERIN  0.4 MG SL tablet Commonly known as: Nitrostat  Place 1 tablet (0.4 mg total) under the tongue every 5 (five) minutes as needed.    OVER THE COUNTER MEDICATION Place 1 drop into both eyes daily as needed (Dry and itching eyes). Thera Tears   oxybutynin  10 MG 24 hr tablet Commonly known as: DITROPAN -XL Take 1 tablet (10 mg total) by mouth daily.   oxyCODONE -acetaminophen  5-325 MG tablet Commonly known as: PERCOCET/ROXICET Take 1-2 tablets by mouth every 4 (four) hours as needed for moderate pain (pain score 4-6).   OXYGEN  Inhale 3-5 L into the lungs continuous. 3-4 liters at rest and 5 liters with any activity.-   pantoprazole  40 MG tablet Commonly known as: PROTONIX  Take 1 tablet by mouth once daily   predniSONE  10 MG tablet Commonly known as: DELTASONE  40 mg x1 day, then 30 mg x1 day, then 20 mg x1 day, then 10 mg x1 day, and then 5 mg x1 day and stop   roflumilast  500 MCG Tabs tablet Commonly known as: DALIRESP  Take 1 tablet (500 mcg total) by mouth daily.   sodium chloride  HYPERTONIC 3 % nebulizer solution Take by nebulization 2 (two) times daily.         Signed: Norman GORMAN Serve 01/16/2024, 7:40 AM

## 2024-01-16 NOTE — Telephone Encounter (Signed)
 Called pt to inform her of handicap placard ready for pick up.  Pt states that she will stop by the office tomorrow.  No further questions or concerns.

## 2024-01-17 ENCOUNTER — Encounter: Admitting: Internal Medicine

## 2024-01-17 ENCOUNTER — Telehealth: Payer: Self-pay

## 2024-01-17 ENCOUNTER — Encounter

## 2024-01-17 DIAGNOSIS — Z006 Encounter for examination for normal comparison and control in clinical research program: Secondary | ICD-10-CM

## 2024-01-17 MED ORDER — STUDY - ARNASA GB43374 (OPEN-LABEL) - ASTEGOLIMAB 238 MG/1.7 ML SQ INJECTION (PI-RAMASWAMY)
476.0000 mg | INJECTION | SUBCUTANEOUS | Status: AC
  Administered 2024-01-17: 476 mg via SUBCUTANEOUS
  Filled 2024-01-17: qty 3.4

## 2024-01-17 NOTE — Research (Signed)
 Title: A phase III Open-Label extension study to evaluate the long-term safety of Astegolimab in patiens with Chronic Obstructive Pulmonary disease.   Dose and Duration of Treatment: Astegolimab is presented as a sterile, slightly brown-yellow solution. Each single-use, 2.25 mL pre-filled syringe contains 1.7 mL deliverable volume. Astegolimab drug product is formulated at 140 mg/mL astegolimab with 114 mM succinic acid, 200 mM L-arginine, 10 mM L-methionine, 0.06% (w/v) polysorbate 20, pH 5.7. Placebo for astegolimab is supplied in an identical pre-filled syringe configuration.   Protocol # [id number] Sponsor: [employer] [address], [place]     Mechanism of action: Astegolimab (also known as [id number] or [phone]) is a fully human, IgG2 monoclonal antibody that binds with high affinity to the interleukin (IL)-33 (IL-33) receptor, ST2, thereby blocking the signaling of IL-33, an inflammatory cytokine of the IL-1 family and member of the alarmin class of molecules. Astegolimab binds with high affinity to the human and cynomolgus monkey receptor for IL-33, ST2, and blocks IL-33 binding, thus inhibiting association with the IL-1R accessory protein (AcP) co-receptor and formation of an activated receptor complex.   Key Inclusion Criteria: Able and willing to provide written informed consent and to comply with the study protocol. Ability to comply with the requirements of study protocol, according to the investigators's best judgement. Completion of the 52-week treatment period in either [place] or Study HA55667.  Key Exclusion Criteria: Significant non- compliance in the parent study, specifically defined as missing scheduled visits, per investigators judgement. Any new clinically significant pulmonary disease other than COPD since enrolling in the study. Unstable cardiac disease, myocardial infarction, or [place]  Heart Association Class III or IV heart  failure since enrolling in the parent study.      Integrated Pharmacokinetic/Pharmacodynamic Analysis:  In Studies HA60757, N6248990, and I843982, exploratory biomarker analysis showed there were consistent decreases in blood eosinophil counts throughout the treatment period, potentially mediated by a direct effect of IL-33 on eosinophil progenitors. In [place], there was no significant difference in fractional exhaled nitric oxide (FeNO) levels (reflecting airway IL-4/IL-13 activity) between astegolimab-treated groups relative to placebo throughout the treatment period. These data suggest that astegolimab has only a limited effect on Type 2 inflammation in asthma. No data are applicable for Study HJ57530. No data are available yet for Study HA56688.  Special Warnings/Considerations: Administration of astegolimab, a protein therapeutic, may lead to the development of anti-astegolimab antibodies which could lead to AEs and/or decreased exposure. In non-clinical studies (see Section 4.2), ADA incidence has generally been low and there was no apparent ADA impact on PK and safety in these studies. To date, the immunogenicity rates observed with astegolimab in clinical studies have been relatively low as well (see Immunogenicity Section 5.6). Several clinical studies have been conducted in patients with asthma, atopic dermatitis, COPD (IIS Study HA59431) and COVID-19 severe pneumonia, which generally showed low incidence of ADAs (Table 31). There has been no correlation between ADA status and clinical findings or increased incidence of AEs. Astegolimab is now being considered in a larger study for the treatment of COPD. This patient population is typically considered to have a hyper-responsive immune system. Route of administration for this molecule will be [place], either Q2W or Q4W. These factors increase the risk of development of an immune response to astegolimab, specifically with repeat dosing. From the  previous IIS Study HA59431, incidence of ADAs was low in COPD patients; this remains to be confirmed in a larger study. To monitor ADA development in  ongoing studies, serum samples will be collected from patients at protocol-defined intervals. Patients who test positive for antibodies and have clinical sequelae that are considered potentially related to an ADA response may also be asked to return for additional follow-up testing.  Drug Interaction Studies: No PK drug interaction studies have been conducted to date.  Serious Adverse Reactions Observed in Asthma Study: During the treatment period, a similar proportion of patients across all cohorts experienced at least one AE, regardless of causality (Table 22). In total, 77.2%, 70.9%, 72.2%, and 72.1% of patients reported at least 1 AE in the placebo, 70 mg, 210 mg, and 490 mg groups, respectively. The most common AE's (>5% in any treatment group) were asthma, nasopharyngitis, upper respiratory tract infection, headache, and injection site reaction (ISR). The most common drug-related AE was ISR, which was reported more frequently in the astegolimab treatment groups than in the placebo group (1 [0.8%] patient in the placebo group, 10 [7.9%] patients with 70 mg, 8 [6.3%] patients with 210 mg, 6 [4.9%] patients with 490 mg). All ISRs were non-serious and mild or moderate in severity. During the treatment period, 50 SAEs were reported in 37 (7.4%) patients. The number of patients reporting SAEs was comparable across all cohorts (11 SAEs in 8 patients on placebo, 21 SAEs in 14 patients on 70 mg, 11 SAEs in 9 patients on 210 mg, and 7 SAEs in 6 patients on 490 mg). The most common SAE was asthma. One SAE of moderate livedo reticularis (70 mg) was considered a suspected unexpected serious adverse drug reaction (SUSAR) related to astegolimab and was reported two days after the second dose, leading to discontinuation of astegolimab. Two (0.4%) patients reported  anaphylaxis and hypersensitivity reactions: 1 severe SAE of anaphylactic reaction (placebo), and 1 moderate hypersensitivity (490 mg) considered related to astegolimab. Three (0.6%) patients experienced a potential Major Adverse Cardiac Event (MACE) (1 patient each in the placebo, 70 mg, and 210 mg groups). None of the potential MACE were considered related to astegolimab. Overall, 233 (46.4%) patients reported events of infection. A comparable number of patients reported infection across all treatment groups (65 [51.2%] patients on placebo, 55 [43.3%] patients on 70 mg, 58 [46.0%] patients on 210 mg, and 55 [45.1%] patients on 490 mg). The most frequently reported infection (>=10% incidence) was nasopharyngitis (12.7%). One patient on astegolimab 210 mg and the partner of one patient on placebo became pregnant during the study. Both delivered normal/healthy babies. Two deaths, unrelated to study drug, were reported: one patient on 210 mg astegolimab died following an SAE of asthma; the other patient on 490 mg astegolimab had an unexplained death. There were no clinically meaningful changes in laboratory parameters, vital signs, or ECG results, other than the 10% decrease in mean blood eosinophil counts in the astegolimab-treated groups, with no safety concerns. Treatment-induced ADAs were comparable between the astegolimab groups and had no impact on safety. Overall, astegolimab was well tolerated at all doses used and had a safety profile consistent with that observed in the previous astegolimab Phase I studies.  Serious Adverse Reactions Observed in Previous COPD Study: In the completed IIS Study HA59431, a total of 81 patients received at least one dose of astegolimab or placebo. The safety profile of astegolimab was similar to that of placebo. There were a total of 222 AEs reported in 62 patients. A total of 28 (72%) patients in the placebo arm and 34 (81%) patients in the astegolimab arm reported at least  one AE. The most  commonly reported AEs were headache followed by urinary tract infection and viral upper respiratory tract infection. A total of 39 SAEs were reported in 28 (35%) patients; 16 (41%) patients in the placebo group and 12 (29%) in the astegolimab group. The most commonly reported SAE was hospital admission for community acquired pneumonia. Four SAEs resulted in patient discontinuation from treatment (1 patient in the placebo group and 3 patients in the astegolimab group). One patient in the placebo group experienced an AESI of potential MACE (heart failure, unrelated to the study treatment). No anaphylaxis or pregnancies were reported. Two deaths, unrelated to study treatment, were reported: 1 patient in the placebo group died after hospital acquired pneumonia, and another patient in the placebo group died after pneumonia and type 2 respiratory failure.  Safety Data: Astegolimab has been generally well tolerated. There have been 49 patient deaths across all astegolimab studies (Section 5.5.2), none of which were considered related to astegolimab. A total of 144 subjects experienced a total of 224 SAEs across all astegolimab studies. Of these, an SAE livedo reticularis observed in 1 patient was considered related to astegolimab by the investigator (Section 5.5.3). AEs leading to withdrawal (Section 5.5.4) have generally occurred at a rate similar to what would be expected for clinical trials in the studies' respective indications.  \xxxxxxxxxxxxxxxxxxxxxxxxxxxxxxxxxxxxxxxxxxxxxxxxxxxxxxxxxxxxx [facility] [place] Clinical Research Coordinator note :    This visit for Subject [id number] with DOB: [birth year] on 02Jul2025 for the above protocol is Visit/Encounter # week 10 and is for purpose of research.    The consent for this encounter is under Protocol Version 2.0 , Investigator Brochure Version 7, Consent Version IRB Approved  revised  and is currently IRB approved.    Subject expressed continued  interest and consent in continuing as a study subject. Subject confirmed that there was no change in contact information (e.g. address, telephone, email). Subject thanked for participation in research and contribution to science.  In this visit Montie Hurst the subject will be evaluated by Octaviano Paras clinical research coordinator. This research coordinator has verified that the above investigator is up to date with his/her training logs.    The Subject was informed that the PI Dr. Geronimo continues to have oversight of the subject's visits and course  through relevant discussions, reviews and also specifically of this visit by routing of this note to the PI.  The patient received the 2 IP injections that were given by Dr. Dorethia Geronimo. Subject tolerated injection well without complaints.     Signed by Octaviano Paras Clinical Research Coordinator  [facility]  [place], [place]

## 2024-01-17 NOTE — Telephone Encounter (Signed)
 Patient's husband picked up ppwk

## 2024-01-17 NOTE — Telephone Encounter (Unsigned)
 Copied from CRM [id number]. Topic: Clinical - Order For Equipment >> Jan 17, 2024  1:08 PM Nathanel DEL wrote: Reason for CRM: [facility] w/ [employer] calling to confirm pt's dr and fax number.  She will be sending order for a portable O2 concentrator.

## 2024-01-17 NOTE — Progress Notes (Signed)
 Title: A phase III Open-Label extension study to evaluate the long-term safety of Astegolimab in patiens with Chronic Obstructive Pulmonary disease.   Dose and Duration of Treatment: Astegolimab is presented as a sterile, slightly brown-yellow solution. Each single-use, 2.25 mL pre-filled syringe contains 1.7 mL deliverable volume. Astegolimab drug product is formulated at 140 mg/mL astegolimab with 114 mM succinic acid, 200 mM L-arginine, 10 mM L-methionine, 0.06% (w/v) polysorbate 20, pH 5.7. Placebo for astegolimab is supplied in an identical pre-filled syringe configuration.   Protocol # [id number] Sponsor: [employer] [address] [place]    Xxxx   PI note  She is just out of surgery Doing well No complaitns LUQ at 10:06 and RUQ at 10:07 am both on 01/17/2024 given by me the PI   Of note this drug was josephina ordered on [facility] on January 03, 2024 by new CRC with intent to order for July 2nd 2025. The corret visit is July 2nd and IP was prepared and dispensed on July 2nd 2025    SIGNATURE    Dr. Dorethia Cave, M.D., F.C.C.P, ACRP-CPI Pulmonary and Critical Care Medicine Research Investigator, [facility] Staff Physician, [facility] Center Director - Interstitial Lung Disease  Program  Pulmonary Fibrosis [facility] Network -  Pulmonary and [facility] [place], [place], [address]   Pager: [phone], If no answer  OR between  19:00-7:00h: page [phone] Telephone (research): [phone]  3:38 PM 01/17/2024   3:38 PM 01/17/2024

## 2024-01-22 ENCOUNTER — Ambulatory Visit: Admitting: Physician Assistant

## 2024-01-22 NOTE — Progress Notes (Signed)
 " Cardiology Office Note:  .   Date:  01/24/2024  ID:  Maria [name], DOB [birth year], MRN [id number] PCP: [name], NP  [facility] Providers Cardiologist:  Gordy Bergamo, MD   History of Present Illness: .   Maria [name] is a 68 y.o. Caucasian female patient with history of left carotid balloon angioplasty on 03/07/2023 staged TCAR on 01/12/24, COPD with pulmonary emphysema, 27/2025prior tobacco use disorder quit smoking in 2017, chronic respiratory hypoxemic failure on chronic 3 to 5 L of oxygen  at baseline, hypercholesterolemia, coronary calcification noted on the CT scan referred to me by Dr. Geronimo for management of chest discomfort that started sometime a year ago.  I seen her on 11/22/2023 and started aggressive therapy for chronic stable angina and PAD and scheduled for cardiac workup including stress test and echocardiogram.  She now presents for follow-up.  Fortunately stress test on 12/07/2023 revealing no evidence of ischemia with preserved and normal LVEF considered to be low risk and no significant change from 2010 study.  Echocardiogram revealed normal LVEF with no significant valvular abnormality.  Discussed the use of AI scribe software for clinical note transcription with the patient, who gave verbal consent to proceed.  History of Present Illness Maria [name] is a 68 year old female with coronary artery disease and interstitial fibrosis who presents with chest pain and shortness of breath.  She experiences exertional chest pain relieved by nitroglycerin . A CT scan for interstitial fibrosis revealed coronary artery plaque buildup without significant blockage. She underwent carotid artery stent placement on January 12, 2024, without complications. Current medications include aspirin , atorvastatin  20 mg daily, and Plavix . Her LDL is at goal. She experiences shortness of breath that is chronic and stable but no leg swelling.  Labs   No results found for: LIPOA   Lab Results  Component Value Date   CHOL 125 01/13/2024   HDL 61 01/13/2024   LDLCALC 56 01/13/2024   TRIG 41 01/13/2024   CHOLHDL 2.0 01/13/2024   No results found for: LIPOA  Lab Results  Component Value Date   NA 140 01/13/2024   K 3.8 01/13/2024   CO2 25 01/13/2024   GLUCOSE 114 (H) 01/13/2024   BUN 9 01/13/2024   CREATININE 0.60 01/13/2024   CALCIUM  8.5 (L) 01/13/2024   GFR 68.59 05/01/2023   GFRNONAA >60 01/13/2024      Latest Ref Rng & Units 01/13/2024    5:21 AM 01/11/2024   10:14 AM 05/12/2023    6:27 AM  BMP  Glucose 70 - 99 mg/dL 885  88  95   BUN 8 - 23 mg/dL 9  13  17    Creatinine 0.44 - 1.00 mg/dL 9.39  9.24  9.36   Sodium 135 - 145 mmol/L 140  135  135   Potassium 3.5 - 5.1 mmol/L 3.8  4.4  3.9   Chloride 98 - 111 mmol/L 107  105  101   CO2 22 - 32 mmol/L 25  23  24    Calcium  8.9 - 10.3 mg/dL 8.5  9.4  9.0       Latest Ref Rng & Units 01/13/2024    5:21 AM 01/12/2024   10:56 AM 01/11/2024   10:14 AM  CBC  WBC 4.0 - 10.5 K/uL 10.6   9.1   Hemoglobin 12.0 - 15.0 g/dL 9.6  9.6  88.4   Hematocrit 36.0 - 46.0 % 29.2  29.4  35.2   Platelets 150 -  400 K/uL 275   326    Lab Results  Component Value Date   HGBA1C 5.7 03/01/2023    Lab Results  Component Value Date   TSH 2.16 03/01/2023     ROS  Review of Systems  Cardiovascular:  Positive for chest pain (stable) and dyspnea on exertion (stable). Negative for leg swelling.    Physical Exam:   VS:  BP 124/60 (BP Location: Left Arm, Patient Position: Sitting, Cuff Size: Normal)   Pulse (!) 59   Resp 16   Ht 5' 7 (1.702 m)   Wt 165 lb 3.2 oz (74.9 kg)   SpO2 95%   BMI 25.87 kg/m    Wt Readings from Last 3 Encounters:  01/24/24 165 lb 3.2 oz (74.9 kg)  01/12/24 164 lb (74.4 kg)  01/08/24 165 lb (74.8 kg)   Physical Exam Neck:     Vascular: Carotid bruit (left. Right TCAR scar evident) present. No JVD.  Cardiovascular:     Rate and Rhythm: Normal rate and regular rhythm.     Pulses:  Intact distal pulses.          Popliteal pulses are 0 on the right side and 0 on the left side.       Dorsalis pedis pulses are 0 on the right side and 0 on the left side.     Heart sounds: Normal heart sounds. No murmur heard.    No gallop.  Pulmonary:     Effort: Pulmonary effort is normal.     Breath sounds: Normal breath sounds.  Abdominal:     General: Bowel sounds are normal.     Palpations: Abdomen is soft.  Musculoskeletal:     Right lower leg: No edema.     Left lower leg: No edema.    Studies Reviewed: .    MYOCARDIAL PERFUSION IMAGING 12/07/2023    Findings are consistent with no ischemia. The study is low risk.   No ST deviation was noted. The ECG was not diagnostic due to pharmacologic protocol.   LV perfusion is normal.   Left ventricular function is normal. Nuclear stress EF: 71%. The left ventricular ejection fraction is hyperdynamic (>65%). End diastolic cavity size is normal. End systolic cavity size is normal.   CT images were obtained for attenuation correction and were examined for the presence of coronary calcium  when appropriate.   Coronary calcium  was present on the attenuation correction CT images. Mild coronary calcifications were present. Aortic atherosclerosis was noted. Coronary calcifications were present in the left anterior descending artery distribution(s).   Prior study available for comparison from 10/04/2008. No changes compared to prior study.   ECG rhythm shows normal sinus rhythm.   No reversible ischemia. LVEF 71% with normal wall motion. Mild LAD calcification and aortic atherosclerosis was noted. This is a low risk study. No change compared to prior study in 2010.  ECHOCARDIOGRAM COMPLETE 12/27/2023  1. Left ventricular ejection fraction, by estimation, is 60 to 65%. Left ventricular ejection fraction by 3D volume is 61 %. The left ventricle has normal function. The left ventricle has no regional wall motion abnormalities. Left ventricular  diastolic parameters were normal. The average left ventricular global longitudinal strain is -26.1 %. The global longitudinal strain is normal. 2. Right ventricular systolic function is normal. The right ventricular size is normal. 3. The mitral valve is normal in structure. No evidence of mitral valve regurgitation. No evidence of mitral stenosis. 4. The aortic valve is normal in structure. Aortic  valve regurgitation is not visualized. No aortic stenosis is present. 5. The inferior vena cava is normal in size with greater than 50% respiratory variability, suggesting right atrial pressure of 3 mmHg.  EKG:         Medications ordered    No orders of the defined types were placed in this encounter.    ASSESSMENT AND PLAN: .      ICD-10-CM   1. Stable angina pectoris (HCC)  I20.89     2. Dyspnea on exertion  R06.09     3. Hypercholesteremia  E78.00      Assessment & Plan Chronic stable angina Intermittent exertional chest pain with normal heart function and low risk nuclear stress test. Chest pain could still be related to coronary artery disease. Current medical therapy includes aspirin , atorvastatin , and Plavix . Nitroglycerin  is available for use as needed. - Resume isosorbide  mononitrate for angina management. - Hold metoprolol  succinate unless chest discomfort persists after resuming isosorbide  mononitrate. - Continue aspirin , atorvastatin , and Plavix  as prescribed. - Use nitroglycerin  as needed for chest pain. - Monitor for worsening chest pain or increased frequency of nitroglycerin  use. - Continue atorvastatin  20 mg once daily. - Ensure LDL cholesterol remains below 55 mg/dL.  Hyperlipidemia Cholesterol levels are well-controlled with atorvastatin . LDL is at goal, slightly above target but acceptable. - Continue atorvastatin  20 mg once daily. - Monitor cholesterol levels to maintain LDL below 55 mg/dL.  Carotid artery stenosis, post-stent placement June 2025 Successful  stent placement in left carotid artery with no complications. Managed by Dr. Serene. - Patient to Communicate with Dr. Serene regarding ongoing management and medication adjustments and my preference to continue plavix  indefinitely instead of asa.  Interstitial lung disease Shortness of breath likely related to interstitial lung disease rather than cardiac issues. No evidence of elevated right heart pressures. - Monitor for changes in respiratory symptoms.  OV in 6 months or sooner if problems Signed,  Gordy Bergamo, MD, [facility] 01/24/2024, 8:04 PM [facility] [address] [place], [place] [address] Phone: [phone]. Fax:  [phone]  "

## 2024-01-24 ENCOUNTER — Encounter: Payer: Self-pay | Admitting: Cardiology

## 2024-01-24 ENCOUNTER — Ambulatory Visit: Attending: Cardiovascular Disease | Admitting: Cardiology

## 2024-01-24 VITALS — BP 124/60 | HR 59 | Resp 16 | Ht 67.0 in | Wt 165.2 lb

## 2024-01-24 DIAGNOSIS — I2089 Other forms of angina pectoris: Secondary | ICD-10-CM

## 2024-01-24 DIAGNOSIS — R0609 Other forms of dyspnea: Secondary | ICD-10-CM

## 2024-01-24 DIAGNOSIS — E7800 Pure hypercholesterolemia, unspecified: Secondary | ICD-10-CM | POA: Diagnosis not present

## 2024-01-24 DIAGNOSIS — I739 Peripheral vascular disease, unspecified: Secondary | ICD-10-CM

## 2024-01-24 NOTE — Patient Instructions (Signed)
 Medication Instructions:  Resume Isosorbide  mononitrate *If you need a refill on your cardiac medications before your next appointment, please call your pharmacy*  Lab Work: none If you have labs (blood work) drawn today and your tests are completely normal, you will receive your results only by: MyChart Message (if you have MyChart) OR A paper copy in the mail If you have any lab test that is abnormal or we need to change your treatment, we will call you to review the results.  Testing/Procedures: none  Follow-Up: At [facility], you and your health needs are our priority.  As part of our continuing mission to provide you with exceptional heart care, our providers are all part of one team.  This team includes your primary Cardiologist (physician) and Advanced Practice Providers or APPs (Physician Assistants and Nurse Practitioners) who all work together to provide you with the care you need, when you need it.  Your next appointment:   6 month(s)  Provider:   Gordy Bergamo, MD    We recommend signing up for the patient portal called MyChart.  Sign up information is provided on this After Visit Summary.  MyChart is used to connect with patients for Virtual Visits (Telemedicine).  Patients are able to view lab/test results, encounter notes, upcoming appointments, etc.  Non-urgent messages can be sent to your provider as well.   To learn more about what you can do with MyChart, go to [email or web address].   Other Instructions

## 2024-01-25 ENCOUNTER — Other Ambulatory Visit: Payer: Self-pay | Admitting: Nurse Practitioner

## 2024-01-25 DIAGNOSIS — I700 Atherosclerosis of aorta: Secondary | ICD-10-CM

## 2024-01-31 ENCOUNTER — Encounter

## 2024-01-31 ENCOUNTER — Telehealth: Payer: Self-pay

## 2024-01-31 ENCOUNTER — Other Ambulatory Visit: Payer: Self-pay | Admitting: Nurse Practitioner

## 2024-01-31 DIAGNOSIS — J441 Chronic obstructive pulmonary disease with (acute) exacerbation: Secondary | ICD-10-CM

## 2024-01-31 DIAGNOSIS — F320 Major depressive disorder, single episode, mild: Secondary | ICD-10-CM

## 2024-01-31 DIAGNOSIS — Z006 Encounter for examination for normal comparison and control in clinical research program: Secondary | ICD-10-CM

## 2024-01-31 MED ORDER — STUDY - ARNASA GB43374 (OPEN-LABEL) - ASTEGOLIMAB 238 MG/1.7 ML SQ INJECTION (PI-RAMASWAMY)
476.0000 mg | INJECTION | SUBCUTANEOUS | Status: AC
  Administered 2024-01-31: 476 mg via SUBCUTANEOUS
  Filled 2024-01-31: qty 3.4

## 2024-01-31 NOTE — Telephone Encounter (Signed)
 Received message from provider stating that he would like to start pt on [place] . Paperwork does not appear to have been completed in any capacity. Will have to start from scratch.  Contacted pt and obtained verbal consent to send her portion via Docu[employer]. Verified that email address on file is correct. Will await return of signed portion.

## 2024-01-31 NOTE — Telephone Encounter (Signed)
 Pt completed and returned her portion.   Completed prescriber form and faxed with clinicals and insurance card copy to [facility]   Phone#: [phone] Fax#: [phone]

## 2024-02-01 NOTE — Research (Addendum)
 Title: A phase III Open-Label extension study to evaluate the long-term safety of Astegolimab in patiens with Chronic Obstructive Pulmonary disease.   Dose and Duration of Treatment: Astegolimab is presented as a sterile, slightly brown-yellow solution. Each single-use, 2.25 mL pre-filled syringe contains 1.7 mL deliverable volume. Astegolimab drug product is formulated at 140 mg/mL astegolimab with 114 mM succinic acid, 200 mM L-arginine, 10 mM L-methionine, 0.06% (w/v) polysorbate 20, pH 5.7. Placebo for astegolimab is supplied in an identical pre-filled syringe configuration.    Protocol # [id number] Sponsor: [employer] [address], [place]   Protocol Version: v3 dated 15Oct2024 IB: version 9.0 dated April 2024 ICF:  ICFs: Make sure that the subject is re-consented with the updated ICFs Main ICF: [facility] Approved Version 15 Jun 2023, Revised 28Nov2024 Mobile Nursing: [facility] [facility] Approved Version 26Oct2023, Revised 08Aug2024 Optional Home Study Treatment: IRB Approved Version 12 May 2022 Revised 23 Feb 2023 ADDENDUM 1: IRB Approved Version 24Jul2024 Revised 8Aug2024 Lab Manual: Lab Manual v2.2.0 dated 05Dec2024        Mechanism of action: Astegolimab (also known as [id number] or [id number]) is a fully human, IgG2 monoclonal antibody that binds with high affinity to the interleukin (IL)-33 (IL-33) receptor, ST2, thereby blocking the signaling of IL-33, an inflammatory cytokine of the IL-1 family and member of the alarmin class of molecules. Astegolimab binds with high affinity to the human and cynomolgus monkey receptor for IL-33, ST2, and blocks IL-33 binding, thus inhibiting association with the IL-1R accessory protein (AcP) co-receptor and formation of an activated receptor complex.   Key Inclusion Criteria: Able and willing to provide written informed consent and to comply with the study protocol. Ability to comply with the requirements of study  protocol, according to the investigators's best judgement. Completion of the 52-week treatment period in either [place] or Study HA55667.   Key Exclusion Criteria: Significant non- compliance in the parent study, specifically defined as missing scheduled visits, per investigators judgement. Any new clinically significant pulmonary disease other than COPD since enrolling in the study. Unstable cardiac disease, myocardial infarction, or [place]  Heart Association Class III or IV heart failure since enrolling in the parent study.          Integrated Pharmacokinetic/Pharmacodynamic Analysis:  In Studies HA60757, [id number], and [id number], exploratory biomarker analysis showed there were consistent decreases in blood eosinophil counts throughout the treatment period, potentially mediated by a direct effect of IL-33 on eosinophil progenitors. In [place], there was no significant difference in fractional exhaled nitric oxide (FeNO) levels (reflecting airway IL-4/IL-13 activity) between astegolimab-treated groups relative to placebo throughout the treatment period. These data suggest that astegolimab has only a limited effect on Type 2 inflammation in asthma. No data are applicable for Study [id number]. No data are available yet for Study HA56688.   Special Warnings/Considerations: Administration of astegolimab, a protein therapeutic, may lead to the development of anti-astegolimab antibodies which could lead to AEs and/or decreased exposure. In non-clinical studies (see Section 4.2), ADA incidence has generally been low and there was no apparent ADA impact on PK and safety in these studies. To date, the immunogenicity rates observed with astegolimab in clinical studies have been relatively low as well (see Immunogenicity Section 5.6). Several clinical studies have been conducted in patients with asthma, atopic dermatitis, COPD (IIS Study HA59431) and COVID-19 severe pneumonia, which generally showed low  incidence of ADAs (Table 31). There has been no correlation between ADA status and clinical findings or increased incidence  of AEs. Astegolimab is now being considered in a larger study for the treatment of COPD. This patient population is typically considered to have a hyper-responsive immune system. Route of administration for this molecule will be Spangle, either Q2W or Q4W. These factors increase the risk of development of an immune response to astegolimab, specifically with repeat dosing. From the previous IIS Study HA59431, incidence of ADAs was low in COPD patients; this remains to be confirmed in a larger study. To monitor ADA development in ongoing studies, serum samples will be collected from patients at protocol-defined intervals. Patients who test positive for antibodies and have clinical sequelae that are considered potentially related to an ADA response may also be asked to return for additional follow-up testing.   Drug Interaction Studies: No PK drug interaction studies have been conducted to date.   Serious Adverse Reactions Observed in Asthma Study: During the treatment period, a similar proportion of patients across all cohorts experienced at least one AE, regardless of causality (Table 22). In total, 77.2%, 70.9%, 72.2%, and 72.1% of patients reported at least 1 AE in the placebo, 70 mg, 210 mg, and 490 mg groups, respectively. The most common AE's (>5% in any treatment group) were asthma, nasopharyngitis, upper respiratory tract infection, headache, and injection site reaction (ISR). The most common drug-related AE was ISR, which was reported more frequently in the astegolimab treatment groups than in the placebo group (1 [0.8%] patient in the placebo group, 10 [7.9%] patients with 70 mg, 8 [6.3%] patients with 210 mg, 6 [4.9%] patients with 490 mg). All ISRs were non-serious and mild or moderate in severity. During the treatment period, 50 SAEs were reported in 37 (7.4%) patients. The number of  patients reporting SAEs was comparable across all cohorts (11 SAEs in 8 patients on placebo, 21 SAEs in 14 patients on 70 mg, 11 SAEs in 9 patients on 210 mg, and 7 SAEs in 6 patients on 490 mg). The most common SAE was asthma. One SAE of moderate livedo reticularis (70 mg) was considered a suspected unexpected serious adverse drug reaction (SUSAR) related to astegolimab and was reported two days after the second dose, leading to discontinuation of astegolimab. Two (0.4%) patients reported anaphylaxis and hypersensitivity reactions: 1 severe SAE of anaphylactic reaction (placebo), and 1 moderate hypersensitivity (490 mg) considered related to astegolimab. Three (0.6%) patients experienced a potential Major Adverse Cardiac Event (MACE) (1 patient each in the placebo, 70 mg, and 210 mg groups). None of the potential MACE were considered related to astegolimab. Overall, 233 (46.4%) patients reported events of infection. A comparable number of patients reported infection across all treatment groups (65 [51.2%] patients on placebo, 55 [43.3%] patients on 70 mg, 58 [46.0%] patients on 210 mg, and 55 [45.1%] patients on 490 mg). The most frequently reported infection (>=10% incidence) was nasopharyngitis (12.7%). One patient on astegolimab 210 mg and the partner of one patient on placebo became pregnant during the study. Both delivered normal/healthy babies. Two deaths, unrelated to study drug, were reported: one patient on 210 mg astegolimab died following an SAE of asthma; the other patient on 490 mg astegolimab had an unexplained death. There were no clinically meaningful changes in laboratory parameters, vital signs, or ECG results, other than the 10% decrease in mean blood eosinophil counts in the astegolimab-treated groups, with no safety concerns. Treatment-induced ADAs were comparable between the astegolimab groups and had no impact on safety. Overall, astegolimab was well tolerated at all doses used and had a  safety  profile consistent with that observed in the previous astegolimab Phase I studies.   Serious Adverse Reactions Observed in Previous COPD Study: In the completed IIS Study HA59431, a total of 81 patients received at least one dose of astegolimab or placebo. The safety profile of astegolimab was similar to that of placebo. There were a total of 222 AEs reported in 62 patients. A total of 28 (72%) patients in the placebo arm and 34 (81%) patients in the astegolimab arm reported at least one AE. The most commonly reported AEs were headache followed by urinary tract infection and viral upper respiratory tract infection. A total of 39 SAEs were reported in 28 (35%) patients; 16 (41%) patients in the placebo group and 12 (29%) in the astegolimab group. The most commonly reported SAE was hospital admission for community acquired pneumonia. Four SAEs resulted in patient discontinuation from treatment (1 patient in the placebo group and 3 patients in the astegolimab group). One patient in the placebo group experienced an AESI of potential MACE (heart failure, unrelated to the study treatment). No anaphylaxis or pregnancies were reported. Two deaths, unrelated to study treatment, were reported: 1 patient in the placebo group died after hospital acquired pneumonia, and another patient in the placebo group died after pneumonia and type 2 respiratory failure.   Safety Data: Astegolimab has been generally well tolerated. There have been 49 patient deaths across all astegolimab studies (Section 5.5.2), none of which were considered related to astegolimab. A total of 144 subjects experienced a total of 224 SAEs across all astegolimab studies. Of these, an SAE livedo reticularis observed in 1 patient was considered related to astegolimab by the investigator (Section 5.5.3). AEs leading to withdrawal (Section 5.5.4) have generally occurred at a rate similar to what would be expected for clinical trials in the studies'  respective indications.   [facility] Clinical Research Coordinator note :    This visit for Subject [id number] with DOB: May 25, 1956 on 01 Jan 2024 for the above protocol is Visit/Encounter # week 12 and is for purpose of research.    Subject expressed continued interest and consent in continuing as a study subject. Subject confirmed that there was no change in contact information (e.g. address, telephone, email). Subject thanked for participation in research and contribution to science.    The Subject was informed that the PI Dr. Geronimo continues to have oversight of the subject's visits and course  through relevant discussions, reviews and also specifically of this visit by routing of this note to the PI.

## 2024-02-02 NOTE — Progress Notes (Signed)
 [name],DOB 1956/03/25,was seen as subject in a clinical trial /Protocol # [id number] Cardiopulmonary symptoms are stable with special reference to her diagnosis of stable angina. Other or new symptoms denied.  Epic medical record was reviewed.  There has been some progression of her normochromic, normocytic anemia.  On 05/01/2023 H/H were 12.8/39.5.  As of 01/13/2024 H/H 9.6/29.2.; but this was in the context left TCAR for symptomatic L ICA stenosis 01/12/2024,most likely representing perioperative blood loss. She was mildly hypotensive post op.  She denies any bleeding dyscrasias other than minor epistaxis which she relates to use of nasal oxygen .  She also has had isolated episodes of minor hematuria.  She does bruise easily in the context of taking Plavix  and low-dose aspirin . The anemia is in the context of having 2 tubular adenomas resected at colonoscopy 11/01/2021.  Apparently prep was suboptimal and repeat colonoscopy was to be rescheduled in 2 weeks as per Dr. Ruel Anna,GI.  This was never completed.  She denies any active GI symptoms. Pertinent physical findings include: Decreased breath sounds with minor rales at the bases; faint right carotid bruit; and minor clubbing. All physical findings NCS. EKG revealed new inversion of T waves in V1 and V2.  Dr. Ladona, her Cardiologist, felt these were nonspecific changes as her angina is stable. Her PCP will be notified of the normochromic, normocytic anemia which has progressed. Again this most likely represents perioperative blood loss rather than any GI etiology. The anemia would be an AE but unrelated to study participation. She received injection of drug or placebo into each lower quadrant.  She was monitored for 30 minutes and no adverse events noted.                                                                     Maria JULIANNA Roses MD,SI

## 2024-02-05 ENCOUNTER — Other Ambulatory Visit: Payer: Self-pay | Admitting: Pain Medicine

## 2024-02-05 ENCOUNTER — Other Ambulatory Visit: Payer: Self-pay | Admitting: Internal Medicine

## 2024-02-05 DIAGNOSIS — G8929 Other chronic pain: Secondary | ICD-10-CM

## 2024-02-05 DIAGNOSIS — Q761 Klippel-Feil syndrome: Secondary | ICD-10-CM

## 2024-02-05 DIAGNOSIS — M5010 Cervical disc disorder with radiculopathy, unspecified cervical region: Secondary | ICD-10-CM

## 2024-02-05 DIAGNOSIS — G894 Chronic pain syndrome: Secondary | ICD-10-CM

## 2024-02-05 NOTE — Telephone Encounter (Signed)
 Received fax from [employer] with summary of benefits. Referral form for [place]  received. Rx will be triaged to [facility].. Once benefits investigation completed, pharmacy will reach out the patient to schedule shipment. If medication is unaffordable, patient will need to express financial hardship to be referred back to [facility] for patient assistance program pre-screening.   Patient ID: [id number] Pharmacy phone: [phone] [facility] Phone#: [phone]  Sherry Pennant, PharmD, MPH, BCPS, CPP Clinical Pharmacist (Rheumatology and Pulmonology)

## 2024-02-06 ENCOUNTER — Other Ambulatory Visit (HOSPITAL_COMMUNITY): Payer: Self-pay

## 2024-02-07 ENCOUNTER — Ambulatory Visit: Admitting: Physician Assistant

## 2024-02-07 DIAGNOSIS — N3946 Mixed incontinence: Secondary | ICD-10-CM | POA: Diagnosis not present

## 2024-02-07 NOTE — Progress Notes (Signed)
 PTNS  Session # 19  Health & Social Factors: change Caffeine: 3 Alcohol: 0 Daytime voids #per day: 2 Night-time voids #per night: 2 Urgency: none  Incontinence Episodes #per day: 3 Ankle used: right  Treatment Setting: 8 Feeling/ Response: sensory Comments: patient tolerated well  Performed By: Powell Sink CMA  Follow Up: 1 month

## 2024-02-12 ENCOUNTER — Other Ambulatory Visit: Payer: Self-pay | Admitting: *Deleted

## 2024-02-12 DIAGNOSIS — M5010 Cervical disc disorder with radiculopathy, unspecified cervical region: Secondary | ICD-10-CM

## 2024-02-12 DIAGNOSIS — G8929 Other chronic pain: Secondary | ICD-10-CM

## 2024-02-12 DIAGNOSIS — Q761 Klippel-Feil syndrome: Secondary | ICD-10-CM

## 2024-02-12 DIAGNOSIS — G894 Chronic pain syndrome: Secondary | ICD-10-CM

## 2024-02-12 MED ORDER — GABAPENTIN 600 MG PO TABS: 600.0000 mg | ORAL_TABLET | Freq: Three times a day (TID) | ORAL | 2 refills | Status: DC

## 2024-02-13 NOTE — Telephone Encounter (Signed)
 Maria [name]

## 2024-02-14 ENCOUNTER — Encounter: Admitting: Internal Medicine

## 2024-02-14 DIAGNOSIS — J441 Chronic obstructive pulmonary disease with (acute) exacerbation: Secondary | ICD-10-CM

## 2024-02-14 DIAGNOSIS — Z006 Encounter for examination for normal comparison and control in clinical research program: Secondary | ICD-10-CM

## 2024-02-14 MED ORDER — STUDY - ARNASA GB43374 (OPEN-LABEL) - ASTEGOLIMAB 238 MG/1.7 ML SQ INJECTION (PI-RAMASWAMY)
476.0000 mg | INJECTION | SUBCUTANEOUS | Status: DC
  Administered 2024-02-14: 476 mg via SUBCUTANEOUS
  Filled 2024-02-14: qty 3.4

## 2024-02-14 NOTE — Research (Unsigned)
 Title: A phase III Open-Label extension study to evaluate the long-term safety of Astegolimab in patiens with Chronic Obstructive Pulmonary disease.   Dose and Duration of Treatment: Astegolimab is presented as a sterile, slightly brown-yellow solution. Each single-use, 2.25 mL pre-filled syringe contains 1.7 mL deliverable volume. Astegolimab drug product is formulated at 140 mg/mL astegolimab with 114 mM succinic acid, 200 mM L-arginine, 10 mM L-methionine, 0.06% (w/v) polysorbate 20, pH 5.7. Placebo for astegolimab is supplied in an identical pre-filled syringe configuration.   Protocol # [id number] Sponsor: [employer] [address], [place]   Protocol Version: v3 dated 15Oct2024 IB: version 9.0 dated April 2024 ICF:  ICFs: Make sure that the subject is re-consented with the updated ICFs Main ICF: [facility] Approved Version 15 Jun 2023, Revised 28Nov2024 Mobile Nursing: [facility] [facility] Approved Version 26Oct2023, Revised 08Aug2024 Optional Home Study Treatment: IRB Approved Version 12 May 2022 Revised 23 Feb 2023 ADDENDUM 1: IRB Approved Version 24Jul2024 Revised 8Aug2024 Lab Manual: Lab Manual v2.2.0 dated 05Dec2024    Mechanism of action: Astegolimab (also known as [id number] or [id number]) is a fully human, IgG2 monoclonal antibody that binds with high affinity to the interleukin (IL)-33 (IL-33) receptor, ST2, thereby blocking the signaling of IL-33, an inflammatory cytokine of the IL-1 family and member of the alarmin class of molecules. Astegolimab binds with high affinity to the human and cynomolgus monkey receptor for IL-33, ST2, and blocks IL-33 binding, thus inhibiting association with the IL-1R accessory protein (AcP) co-receptor and formation of an activated receptor complex.   Key Inclusion Criteria: Able and willing to provide written informed consent and to comply with the study protocol. Ability to comply with the requirements of study protocol,  according to the investigators's best judgement. Completion of the 52-week treatment period in either [place] or Study HA55667.   Key Exclusion Criteria: Significant non- compliance in the parent study, specifically defined as missing scheduled visits, per investigators judgement. Any new clinically significant pulmonary disease other than COPD since enrolling in the study. Unstable cardiac disease, myocardial infarction, or [place]  Heart Association Class III or IV heart failure since enrolling in the parent study.   Integrated Pharmacokinetic/Pharmacodynamic Analysis:  In Studies HA60757, [id number], and [id number], exploratory biomarker analysis showed there were consistent decreases in blood eosinophil counts throughout the treatment period, potentially mediated by a direct effect of IL-33 on eosinophil progenitors. In [place], there was no significant difference in fractional exhaled nitric oxide (FeNO) levels (reflecting airway IL-4/IL-13 activity) between astegolimab-treated groups relative to placebo throughout the treatment period. These data suggest that astegolimab has only a limited effect on Type 2 inflammation in asthma. No data are applicable for Study HJ57530. No data are available yet for Study HA56688.   Special Warnings/Considerations: Administration of astegolimab, a protein therapeutic, may lead to the development of anti-astegolimab antibodies which could lead to AEs and/or decreased exposure. In non-clinical studies (see Section 4.2), ADA incidence has generally been low and there was no apparent ADA impact on PK and safety in these studies. To date, the immunogenicity rates observed with astegolimab in clinical studies have been relatively low as well (see Immunogenicity Section 5.6). Several clinical studies have been conducted in patients with asthma, atopic dermatitis, COPD (IIS Study HA59431) and COVID-19 severe pneumonia, which generally showed low incidence of ADAs  (Table 31). There has been no correlation between ADA status and clinical findings or increased incidence of AEs. Astegolimab is now being considered in a larger study for  the treatment of COPD. This patient population is typically considered to have a hyper-responsive immune system. Route of administration for this molecule will be [place], either Q2W or Q4W. These factors increase the risk of development of an immune response to astegolimab, specifically with repeat dosing. From the previous IIS Study HA59431, incidence of ADAs was low in COPD patients; this remains to be confirmed in a larger study. To monitor ADA development in ongoing studies, serum samples will be collected from patients at protocol-defined intervals. Patients who test positive for antibodies and have clinical sequelae that are considered potentially related to an ADA response may also be asked to return for additional follow-up testing.   Drug Interaction Studies: No PK drug interaction studies have been conducted to date.   Serious Adverse Reactions Observed in Asthma Study: During the treatment period, a similar proportion of patients across all cohorts experienced at least one AE, regardless of causality (Table 22). In total, 77.2%, 70.9%, 72.2%, and 72.1% of patients reported at least 1 AE in the placebo, 70 mg, 210 mg, and 490 mg groups, respectively. The most common AE's (>5% in any treatment group) were asthma, nasopharyngitis, upper respiratory tract infection, headache, and injection site reaction (ISR). The most common drug-related AE was ISR, which was reported more frequently in the astegolimab treatment groups than in the placebo group (1 [0.8%] patient in the placebo group, 10 [7.9%] patients with 70 mg, 8 [6.3%] patients with 210 mg, 6 [4.9%] patients with 490 mg). All ISRs were non-serious and mild or moderate in severity. During the treatment period, 50 SAEs were reported in 37 (7.4%) patients. The number of patients  reporting SAEs was comparable across all cohorts (11 SAEs in 8 patients on placebo, 21 SAEs in 14 patients on 70 mg, 11 SAEs in 9 patients on 210 mg, and 7 SAEs in 6 patients on 490 mg). The most common SAE was asthma. One SAE of moderate livedo reticularis (70 mg) was considered a suspected unexpected serious adverse drug reaction (SUSAR) related to astegolimab and was reported two days after the second dose, leading to discontinuation of astegolimab. Two (0.4%) patients reported anaphylaxis and hypersensitivity reactions: 1 severe SAE of anaphylactic reaction (placebo), and 1 moderate hypersensitivity (490 mg) considered related to astegolimab. Three (0.6%) patients experienced a potential Major Adverse Cardiac Event (MACE) (1 patient each in the placebo, 70 mg, and 210 mg groups). None of the potential MACE were considered related to astegolimab. Overall, 233 (46.4%) patients reported events of infection. A comparable number of patients reported infection across all treatment groups (65 [51.2%] patients on placebo, 55 [43.3%] patients on 70 mg, 58 [46.0%] patients on 210 mg, and 55 [45.1%] patients on 490 mg). The most frequently reported infection (>=10% incidence) was nasopharyngitis (12.7%). One patient on astegolimab 210 mg and the partner of one patient on placebo became pregnant during the study. Both delivered normal/healthy babies. Two deaths, unrelated to study drug, were reported: one patient on 210 mg astegolimab died following an SAE of asthma; the other patient on 490 mg astegolimab had an unexplained death. There were no clinically meaningful changes in laboratory parameters, vital signs, or ECG results, other than the 10% decrease in mean blood eosinophil counts in the astegolimab-treated groups, with no safety concerns. Treatment-induced ADAs were comparable between the astegolimab groups and had no impact on safety. Overall, astegolimab was well tolerated at all doses used and had a safety  profile consistent with that observed in the previous astegolimab Phase I studies.  Serious Adverse Reactions Observed in Previous COPD Study: In the completed IIS Study HA59431, a total of 81 patients received at least one dose of astegolimab or placebo. The safety profile of astegolimab was similar to that of placebo. There were a total of 222 AEs reported in 62 patients. A total of 28 (72%) patients in the placebo arm and 34 (81%) patients in the astegolimab arm reported at least one AE. The most commonly reported AEs were headache followed by urinary tract infection and viral upper respiratory tract infection. A total of 39 SAEs were reported in 28 (35%) patients; 16 (41%) patients in the placebo group and 12 (29%) in the astegolimab group. The most commonly reported SAE was hospital admission for community acquired pneumonia. Four SAEs resulted in patient discontinuation from treatment (1 patient in the placebo group and 3 patients in the astegolimab group). One patient in the placebo group experienced an AESI of potential MACE (heart failure, unrelated to the study treatment). No anaphylaxis or pregnancies were reported. Two deaths, unrelated to study treatment, were reported: 1 patient in the placebo group died after hospital acquired pneumonia, and another patient in the placebo group died after pneumonia and type 2 respiratory failure.   Safety Data: Astegolimab has been generally well tolerated. There have been 49 patient deaths across all astegolimab studies (Section 5.5.2), none of which were considered related to astegolimab. A total of 144 subjects experienced a total of 224 SAEs across all astegolimab studies. Of these, an SAE livedo reticularis observed in 1 patient was considered related to astegolimab by the investigator (Section 5.5.3). AEs leading to withdrawal (Section 5.5.4) have generally occurred at a rate similar to what would be expected for clinical trials in the studies' respective  indications.   [facility] [place] Clinical Research Coordinator note:    This visit for Subject [id number] with DOB: 1955-12-23 on 30Jul2025 for the above protocol is Visit/Encounter # week 14 and is for purpose of research.    Subject expressed continued interest and consent in continuing as a study subject. Subject confirmed that there was no change in contact information (e.g. address, telephone, email). Subject thanked for participation in research and contribution to science.     The Subject was informed that the PI Dr. Geronimo continues to have oversight of the subject's visits and course through relevant discussions, reviews and specifically of this visit by routing of this note to the PI.        Signed by  Octaviano Paras  Clinical Research Coordinator  [facility]  [place], [place] Signature Time/Date: 2:35 PM 02/15/2024

## 2024-02-16 ENCOUNTER — Other Ambulatory Visit: Payer: Self-pay

## 2024-02-16 DIAGNOSIS — J441 Chronic obstructive pulmonary disease with (acute) exacerbation: Secondary | ICD-10-CM

## 2024-02-16 MED ORDER — ALBUTEROL SULFATE HFA 108 (90 BASE) MCG/ACT IN AERS: 2.0000 | INHALATION_SPRAY | Freq: Four times a day (QID) | RESPIRATORY_TRACT | 5 refills | Status: DC | PRN

## 2024-02-27 ENCOUNTER — Ambulatory Visit

## 2024-02-27 ENCOUNTER — Telehealth: Payer: Self-pay

## 2024-02-27 VITALS — BP 120/70 | Ht 67.0 in | Wt 165.0 lb

## 2024-02-27 DIAGNOSIS — Z0000 Encounter for general adult medical examination without abnormal findings: Secondary | ICD-10-CM

## 2024-02-27 NOTE — Patient Instructions (Signed)
 [name] , Thank you for taking time out of your busy schedule to complete your Annual Wellness Visit with me. I enjoyed our conversation and look forward to speaking with you again next year. I, as well as your care team,  appreciate your ongoing commitment to your health goals. Please review the following plan we discussed and let me know if I can assist you in the future. Your Game plan/ To Do List    Referrals: If you haven't heard from the office you've been referred to, please reach out to them at the phone provided.   Follow up Visits: We will see or speak with you next year for your Next Medicare AWV with our clinical staff Have you seen your provider in the last 6 months (3 months if uncontrolled diabetes)? Yes  Clinician Recommendations:  Aim for 30 minutes of exercise or brisk walking, 6-8 glasses of water, and 5 servings of fruits and vegetables each day.       This is a list of the screenings recommended for you:  Health Maintenance  Topic Date Due   Flu Shot  02/16/2024   COVID-19 Vaccine (5 - 2024-25 season) 07/03/2024*   Screening for Lung Cancer  05/10/2024   Medicare Annual Wellness Visit  02/26/2025   Mammogram  03/15/2025   Colon Cancer Screening  11/02/2031   DTaP/Tdap/Td vaccine (4 - Td or Tdap) 03/26/2033   Pneumococcal Vaccine for age over 80  Completed   DEXA scan (bone density measurement)  Completed   Hepatitis C Screening  Completed   Zoster (Shingles) Vaccine  Completed   Hepatitis B Vaccine  Aged Out   HPV Vaccine  Aged Out   Meningitis B Vaccine  Aged Out  *Topic was postponed. The date shown is not the original due date.    Advanced directives: (In Chart) A copy of your advanced directives are scanned into your chart should your provider ever need it. Advance Care Planning is important because it:  [x]  Makes sure you receive the medical care that is consistent with your values, goals, and preferences  [x]  It provides guidance to your family and  loved ones and reduces their decisional burden about whether or not they are making the right decisions based on your wishes.  Follow the link provided in your after visit summary or read over the paperwork we have mailed to you to help you started getting your Advance Directives in place. If you need assistance in completing these, please reach out to us  so that we can help you!  See attachments for Preventive Care and Fall Prevention Tips.

## 2024-02-27 NOTE — Telephone Encounter (Signed)
 Copied from CRM [id number]. Topic: General - Other >> Feb 27, 2024  4:08 PM Celestine FALCON wrote: Reason for CRM: Natalya from [employer]  is calling to confirm if the pt is still using oxygen . I did see an encounter from feb 2025 stating she was an order for o2 was being placed.   Natalya stated she would like a fax sent to [phone] confirming the pt is still on oxygen  to establish continuity of care.

## 2024-02-27 NOTE — Progress Notes (Signed)
 Because this visit was a virtual/telehealth visit,  certain criteria was not obtained, such a blood pressure, CBG if applicable, and timed get up and go. Any medications not marked as taking were not mentioned during the medication reconciliation part of the visit. Any vitals not documented were not able to be obtained due to this being a telehealth visit or patient was unable to self-report a recent blood pressure reading due to a lack of equipment at home via telehealth. Vitals that have been documented are verbally provided by the patient.  This visit was performed by a medical professional under my direct supervision. I was immediately available for consultation/collaboration. I have reviewed and agree with the Annual Wellness Visit documentation.  Subjective:   Maria [name] is a 68 y.o. who presents for a Medicare Wellness preventive visit.  As a reminder, Annual Wellness Visits don't include a physical exam, and some assessments may be limited, especially if this visit is performed virtually. We may recommend an in-person follow-up visit with your provider if needed.  Visit Complete: Virtual I connected with  Maria [name] on 02/27/24 by a audio enabled telemedicine application and verified that I am speaking with the correct person using two identifiers.  Patient Location: Home  Provider Location: Home Office  I discussed the limitations of evaluation and management by telemedicine. The patient expressed understanding and agreed to proceed.  Vital Signs: Because this visit was a virtual/telehealth visit, some criteria may be missing or patient reported. Any vitals not documented were not able to be obtained and vitals that have been documented are patient reported.  VideoDeclined- This patient declined Librarian, academic. Therefore the visit was completed with audio only.  Persons Participating in Visit: Patient.  AWV Questionnaire: No: Patient Medicare  AWV questionnaire was not completed prior to this visit.  Cardiac Risk Factors include: advanced age (>41men, >41 women);hypertension;dyslipidemia     Objective:    Today's Vitals   02/27/24 1027  BP: 120/70  Weight: 165 lb (74.8 kg)  Height: 5' 7 (1.702 m)   Body mass index is 25.84 kg/m.     02/27/2024   10:26 AM 01/13/2024    2:37 PM 01/11/2024   10:01 AM 05/10/2023    4:00 PM 03/23/2023    9:20 AM 03/07/2023    4:45 PM 02/06/2023    9:29 AM  Advanced Directives  Does Patient Have a Medical Advance Directive? Yes Unable to assess, patient is non-responsive or altered mental status Yes Yes Yes Yes Yes  Type of Advance Directive Healthcare Power of [place];Living will  Healthcare Power of [place];Living will Living will;Healthcare Power of [employer] Power of [place];Living will Healthcare Power of Attorney   Does patient want to make changes to medical advance directive? No - Patient declined   No - Patient declined  No - Patient declined No - Patient declined  Copy of Healthcare Power of Attorney in Chart? Yes - validated most recent copy scanned in chart (See row information)  Yes - validated most recent copy scanned in chart (See row information)        Current Medications (verified) Outpatient Encounter Medications as of 02/27/2024  Medication Sig   albuterol  (VENTOLIN  HFA) 108 (90 Base) MCG/ACT inhaler Inhale 2 puffs into the lungs every 6 (six) hours as needed for wheezing or shortness of breath.   ASPIRIN  LOW DOSE 81 MG EC tablet TAKE 1 TABLET BY MOUTH EVERY DAY   atorvastatin  (LIPITOR) 20 MG tablet Take 1  tablet (20 mg total) by mouth daily.   Azelastine  HCl 137 MCG/SPRAY SOLN USE 1 SPRAY INTO EACH NOSTRIL TWICE A DAY   benzonatate  (TESSALON ) 200 MG capsule Take 1 capsule by mouth three times daily as needed   Budeson-Glycopyrrol-Formoterol  (BREZTRI  AEROSPHERE) 160-9-4.8 MCG/ACT AERO Inhale 2 puffs into the lungs in the morning and at bedtime.   clopidogrel   (PLAVIX ) 75 MG tablet Take 1 tablet (75 mg total) by mouth daily.   escitalopram  (LEXAPRO ) 20 MG tablet Take 1 tablet by mouth once daily   gabapentin  (NEURONTIN ) 600 MG tablet Take 1 tablet (600 mg total) by mouth every 8 (eight) hours.   hydrOXYzine  (VISTARIL ) 25 MG capsule TAKE 1 CAPSULE BY MOUTH AT BEDTIME AS NEEDED (Patient taking differently: Take 25 mg by mouth at bedtime.)   ipratropium-albuterol  (DUONEB) 0.5-2.5 (3) MG/3ML SOLN USE 1 AMPULE IN NEBULIZER 4 TIMES DAILY AS NEEDED   isosorbide  mononitrate (IMDUR ) 30 MG 24 hr tablet Take 1 tablet (30 mg total) by mouth every morning.   [Paused] metoprolol  succinate (TOPROL -XL) 25 MG 24 hr tablet Take 1 tablet (25 mg total) by mouth every evening. Take with or immediately following a meal.   Multiple Vitamins-Minerals (MULTIVITAMIN WITH MINERALS) tablet Take 1 tablet by mouth daily. Alive Woman 50+   nitroGLYCERIN  (NITROSTAT ) 0.4 MG SL tablet Place 1 tablet (0.4 mg total) under the tongue every 5 (five) minutes as needed.   OVER THE COUNTER MEDICATION Place 1 drop into both eyes daily as needed (Dry and itching eyes). Thera Tears   oxybutynin  (DITROPAN -XL) 10 MG 24 hr tablet Take 1 tablet (10 mg total) by mouth daily.   oxyCODONE -acetaminophen  (PERCOCET/ROXICET) 5-325 MG tablet Take 1-2 tablets by mouth every 4 (four) hours as needed for moderate pain (pain score 4-6).   OXYGEN  Inhale 3-5 L into the lungs continuous. 3-4 liters at rest and 5 liters with any activity.-   pantoprazole  (PROTONIX ) 40 MG tablet Take 1 tablet by mouth once daily   Respiratory Therapy Supplies (FLUTTER) DEVI Use as directed   roflumilast  (DALIRESP ) 500 MCG TABS tablet Take 1 tablet (500 mcg total) by mouth daily.   sodium chloride  HYPERTONIC 3 % nebulizer solution Take by nebulization 2 (two) times daily.   Facility-Administered Encounter Medications as of 02/27/2024  Medication   Study - ARNASA [id number] (open-label) - astegolimab 238 mg/1.7 mL SQ injection  (PI-Ramaswamy)   Study - ARNASA [id number] (open-label) - astegolimab 238 mg/1.7 mL SQ injection (PI-Ramaswamy)   Study - ARNASA [id number] (open-label) - astegolimab 238 mg/1.7 mL SQ injection (PI-Ramaswamy)   Study - ARNASA [id number] - astegolimab 238 mg/1.7 mL or placebo SQ injection (PI-Ramaswamy)   Study - ARNASA [id number] - astegolimab 238 mg/1.7 mL or placebo SQ injection (PI-Ramaswamy)    Allergies (verified) Montelukast    History: Past Medical History:  Diagnosis Date   Anginal pain (HCC)    Atherosclerosis 1/0612014   aorta, iliacs and CFA bilaterally no greater than 0-49% - lower arterial doppler.   Blood transfusion without reported diagnosis 2005   Cancer [facility]) Ovarian 1978   Chest pain    COPD (chronic obstructive pulmonary disease) (HCC)    Coronary artery disease    COVID-19 long hauler manifesting chronic dyspnea    Depression    Dyspnea    with minimal exertion   Emphysema of lung (HCC) 2019   Oxygen  deficiency 03/2019   Oxygen  dependent    3 liters up to 5   Peripheral vascular disease (HCC)    Carotid  Artery Stenosis   Pneumonia    Past Surgical History:  Procedure Laterality Date   ABDOMINAL HYSTERECTOMY  1978   BREAST BIOPSY Left 04/04/2023   Stereo bx, X clip, path pending   BREAST BIOPSY Left 04/04/2023   MM LT BREAST BX W LOC DEV 1ST LESION IMAGE BX SPEC STEREO GUIDE 04/04/2023 [facility]   CAROTID PTA/STENT INTERVENTION Left 03/07/2023   Procedure: CAROTID PTA/STENT INTERVENTION;  Surgeon: Jama Cordella MATSU, MD;  Location: [facility] INVASIVE CV LAB;  Service: Cardiovascular;  Laterality: Left;   CHOLECYSTECTOMY  2010   COLONOSCOPY     COLONOSCOPY     COLONOSCOPY WITH PROPOFOL  N/A 11/01/2021   Procedure: COLONOSCOPY WITH PROPOFOL ;  Surgeon: Therisa Bi, MD;  Location: [facility] ENDOSCOPY;  Service: Gastroenterology;  Laterality: N/A;   ESOPHAGOGASTRODUODENOSCOPY     NECK SURGERY  1999   Trach   OVARIAN CYST REMOVAL Bilateral 1988   SPINE SURGERY  Neck  2002   TRANSCAROTID ARTERY REVASCULARIZATION  Left 01/12/2024   Procedure: TRANSCAROTID ARTERY REVASCULARIZATION (TCAR);  Surgeon: Serene Gaile ORN, MD;  Location: [facility] OR;  Service: Vascular;  Laterality: Left;   Family History  Problem Relation Age of Onset   Heart failure Mother    Diabetes Mother    Kidney disease Mother    Hypertension Mother    Arthritis Mother    Heart disease Mother    Obesity Mother    COPD Father    Hearing loss Father    Lung cancer Brother    COPD Brother    COPD Brother    Asthma Son    Diabetes Son    Birth defects Son    Breast cancer Maternal Aunt 40   Social History   Socioeconomic History   Marital status: Married    Spouse name: Evalene Manufacturing systems engineer)   Number of children: 3   Years of education: Some college   Highest education level: GED or equivalent  Occupational History   Not on file  Tobacco Use   Smoking status: Former    Current packs/day: 0.00    Average packs/day: 1 pack/day for 45.0 years (45.0 ttl pk-yrs)    Types: Cigarettes    Start date: 03/19/1971    Quit date: 03/18/2016    Years since quitting: 7.9    Passive exposure: Past   Smokeless tobacco: Never  Vaping Use   Vaping status: Never Used  Substance and Sexual Activity   Alcohol use: No    Alcohol/week: 0.0 standard drinks of alcohol   Drug use: No   Sexual activity: Not Currently  Other Topics Concern   Not on file  Social History Narrative   08/27/19   From: the area (but [place] area[place]   Living: with husband [name] - since 1987   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      Family: Daughter Recruitment consultant nearby, son Adriana in [place], and other daughter - 1 hour; 5 grandchildren      Enjoys: crochet, relax at home      Exercise: on hold but has been doing pulmonary rehab   Diet: not currently, but has lost weight      Safety   Seat belts: Yes    Guns: Yes  and secure   Safe in relationships: Yes    Social Drivers of Health   Financial Resource  Strain: Low Risk  (02/27/2024)   Overall Financial Resource Strain (CARDIA)    Difficulty of Paying Living Expenses: Not very hard  Food Insecurity: Patient Unable To Answer (02/27/2024)   Hunger Vital Sign    Worried About Running Out of Food in the Last Year: Patient unable to answer    Ran Out of Food in the Last Year: Patient unable to answer  Transportation Needs: No Transportation Needs (02/27/2024)   PRAPARE - Administrator, Civil Service (Medical): No    Lack of Transportation (Non-Medical): No  Physical Activity: Patient Declined (02/27/2024)   Exercise Vital Sign    Days of Exercise per Week: Patient declined    Minutes of Exercise per Session: Patient declined  Stress: Stress Concern Present (02/27/2024)   [employer] of Occupational Health - Occupational Stress Questionnaire    Feeling of Stress: Rather much  Social Connections: Socially Integrated (02/27/2024)   Social Connection and Isolation Panel    Frequency of Communication with Friends and Family: Three times a week    Frequency of Social Gatherings with Friends and Family: Three times a week    Attends Religious Services: More than 4 times per year    Active Member of Clubs or Organizations: Yes    Attends Engineer, structural: More than 4 times per year    Marital Status: Married    Tobacco Counseling Counseling given: Not Answered    Clinical Intake:  Pre-visit preparation completed: Yes  Pain : No/denies pain     BMI - recorded: 25.48 Nutritional Status: BMI 25 -29 Overweight Nutritional Risks: None Diabetes: No  Lab Results  Component Value Date   HGBA1C 5.7 03/01/2023   HGBA1C 6.0 10/15/2021   HGBA1C 6.2 08/29/2019     How often do you need to have someone help you when you read instructions, pamphlets, or other written materials from your doctor or pharmacy?: 1 - Never  Interpreter Needed?: No  Information entered by :: Loren Vicens,CMA   Activities of  Daily Living     02/27/2024   10:29 AM 01/13/2024    2:36 PM  In your present state of health, do you have any difficulty performing the following activities:  Hearing? 0   Vision? 0   Difficulty concentrating or making decisions? 1   Walking or climbing stairs? 1   Dressing or bathing? 0   Doing errands, shopping? 0 0  Preparing Food and eating ? N   Using the Toilet? N   In the past six months, have you accidently leaked urine? Y   Do you have problems with loss of bowel control? Y   Managing your Medications? N   Managing your Finances? N   Housekeeping or managing your Housekeeping? N     Patient Care Team: [name], NP as PCP - General (Nurse Practitioner) Ladona Heinz, MD as PCP - Cardiology (Cardiology)  I have updated your Care Teams any recent Medical Services you may have received from other providers in the past year.     Assessment:   This is a routine wellness examination for Catia.  Hearing/Vision screen Hearing Screening - Comments:: No difficulties  Vision Screening - Comments:: Patient wears glasses    Goals Addressed               This Visit's Progress     Stay Alive! (pt-stated)   On track      Depression Screen     02/27/2024   10:33 AM 07/25/2023    1:56 PM 06/30/2023    8:50 AM 03/01/2023    9:01 AM 02/27/2023  4:36 PM 11/30/2022    9:32 AM 11/03/2022    8:45 AM  PHQ 2/9 Scores  PHQ - 2 Score 2  0 0 0 2 0  PHQ- 9 Score 5   2  7    Exception Documentation  Patient refusal         Fall Risk     02/27/2024   10:29 AM 06/30/2023    8:50 AM 03/23/2023    9:20 AM 03/01/2023    9:02 AM 02/27/2023    4:39 PM  Fall Risk   Falls in the past year? 1 1 1 1 1   Number falls in past yr: 0 1 1 1 1   Injury with Fall? 0 0 0 1 1  Comment     Fx Left foot followed by medical attention  Risk for fall due to : History of fall(s);Impaired balance/gait;Orthopedic patient;Impaired mobility   History of fall(s);Other (Comment)   Follow up Falls  evaluation completed;Education provided   Falls evaluation completed     MEDICARE RISK AT HOME:  Medicare Risk at Home Any stairs in or around the home?: Yes If so, are there any without handrails?: No Home free of loose throw rugs in walkways, pet beds, electrical cords, etc?: Yes Adequate lighting in your home to reduce risk of falls?: Yes Life alert?: No Use of a cane, walker or w/c?: No Grab bars in the bathroom?: Yes Shower chair or bench in shower?: Yes Elevated toilet seat or a handicapped toilet?: Yes  TIMED UP AND GO:  Was the test performed?  No  Cognitive Function: 6CIT completed        02/27/2024   10:34 AM 02/27/2023    4:42 PM  6CIT Screen  What Year? 0 points 0 points  What month? 0 points 0 points  What time? 0 points 0 points  Count back from 20 0 points 0 points  Months in reverse 0 points 0 points  Repeat phrase 0 points 0 points  Total Score 0 points 0 points    Immunizations Immunization History  Administered Date(s) Administered   Fluad Quad(high Dose 65+) 03/30/2022   Influenza, High Dose Seasonal PF 03/30/2022, 03/27/2023   Influenza, Seasonal, Injecte, Preservative Fre 04/28/2008, 04/09/2010   Influenza,inj,Quad PF,6+ Mos 04/22/2019, 04/25/2020, 03/15/2021   Influenza-Unspecified 06/01/2008, 05/10/2016, 03/18/2017   Moderna Sars-Covid-2 Vaccination 07/30/2019, 08/28/2019, 06/27/2020   PNEUMOCOCCAL CONJUGATE-20 07/06/2022   Pfizer Covid-19 Vaccine Bivalent Booster 30yrs & up 07/28/2021   Pneumococcal Polysaccharide-23 04/15/2015, 04/22/2020   RSV,unspecified 07/06/2022   Respiratory Syncytial Virus Vaccine,Recomb Aduvanted(Arexvy) 05/30/2022   Td 10/01/2020   Tdap 11/17/2009, 03/27/2023   Zoster Recombinant(Shingrix) 04/16/2020, 06/22/2020    Screening Tests Health Maintenance  Topic Date Due   INFLUENZA VACCINE  02/16/2024   COVID-19 Vaccine (5 - 2024-25 season) 07/03/2024 (Originally 03/19/2023)   Lung Cancer Screening  05/10/2024    Medicare Annual Wellness (AWV)  02/26/2025   MAMMOGRAM  03/15/2025   Colonoscopy  11/02/2031   DTaP/Tdap/Td (4 - Td or Tdap) 03/26/2033   Pneumococcal Vaccine: 50+ Years  Completed   DEXA SCAN  Completed   Hepatitis C Screening  Completed   Zoster Vaccines- Shingrix  Completed   Hepatitis B Vaccines  Aged Out   HPV VACCINES  Aged Out   Meningococcal B Vaccine  Aged Out    Health Maintenance  Health Maintenance Due  Topic Date Due   INFLUENZA VACCINE  02/16/2024   Health Maintenance Items Addressed:patient declined flu vaccination  Additional Screening:  Vision  Screening: Recommended annual ophthalmology exams for early detection of glaucoma and other disorders of the eye. Would you like a referral to an eye doctor? No    Dental Screening: Recommended annual dental exams for proper oral hygiene  Community Resource Referral / Chronic Care Management: CRR required this visit?  No   CCM required this visit?  No   Plan:    I have personally reviewed and noted the following in the patient's chart:   Medical and social history Use of alcohol, tobacco or illicit drugs  Current medications and supplements including opioid prescriptions. Patient is not currently taking opioid prescriptions. Functional ability and status Nutritional status Physical activity Advanced directives List of other physicians Hospitalizations, surgeries, and ER visits in previous 12 months Vitals Screenings to include cognitive, depression, and falls Referrals and appointments  In addition, I have reviewed and discussed with patient certain preventive protocols, quality metrics, and best practice recommendations. A written personalized care plan for preventive services as well as general preventive health recommendations were provided to patient.   Lyle MARLA Right, [place]   02/27/2024   After Visit Summary: (MyChart) Due to this being a telephonic visit, the after visit summary with patients  personalized plan was offered to patient via MyChart   Notes: Nothing significant to report at this time.

## 2024-02-28 ENCOUNTER — Encounter: Admitting: Internal Medicine

## 2024-02-28 DIAGNOSIS — Z006 Encounter for examination for normal comparison and control in clinical research program: Secondary | ICD-10-CM

## 2024-02-28 DIAGNOSIS — J441 Chronic obstructive pulmonary disease with (acute) exacerbation: Secondary | ICD-10-CM

## 2024-02-28 MED ORDER — STUDY - ARNASA GB43374 (OPEN-LABEL) - ASTEGOLIMAB 238 MG/1.7 ML SQ INJECTION (PI-RAMASWAMY)
476.0000 mg | INJECTION | SUBCUTANEOUS | Status: AC
  Administered 2024-02-28: 476 mg via SUBCUTANEOUS
  Filled 2024-02-28: qty 3.4

## 2024-02-28 NOTE — Telephone Encounter (Signed)
 Please advise,this began on Monday,has had wheezing and shortness and breath that has gotten worse than yesterday,no fever,thinks it is a flare up

## 2024-02-28 NOTE — Research (Signed)
 Title: A phase III Open-Label extension study to evaluate the long-term safety of Astegolimab in patients with Chronic Obstructive Pulmonary disease.   Dose and Duration of Treatment: Astegolimab is presented as a sterile, slightly brown-yellow solution. Each single-use, 2.25 mL pre-filled syringe contains 1.7 mL deliverable volume. Astegolimab drug product is formulated at 140 mg/mL astegolimab with 114 mM succinic acid, 200 mM L-arginine, 10 mM L-methionine, 0.06% (w/v) polysorbate 20, pH 5.7. Placebo for astegolimab is supplied in an identical pre-filled syringe configuration.   Protocol # [id number] Sponsor: [employer] [id number], [place]   Protocol Version: v3 dated 15Oct2024 IB: version 9.0 dated April 2024 ICF:  ICFs: Make sure that the subject is re-consented with the updated ICFs Main ICF: [employer] IR[facility] Approved Version 15 Jun 2023, Revised 28Nov2024 Mobile Nursing: [employer] IR[facility] Approved Version 26Oct2023, Revised 08Aug2024 Optional Home Study Treatment: IRB Approved Version 12 May 2022 Revised 23 Feb 2023 ADDENDUM 1: IRB Approved Version 24Jul2024 Revised 8Aug2024 Lab Manual: V2.1.0 dated 21Jun2024   Mechanism of action: Astegolimab (also known as [id number] or [id number]) is a fully human, IgG2 monoclonal antibody that binds with high affinity to the interleukin (IL)-33 (IL-33) receptor, ST2, thereby blocking the signaling of IL-33, an inflammatory cytokine of the IL-1 family and member of the alarmin class of molecules. Astegolimab binds with high affinity to the human and cynomolgus monkey receptor for IL-33, ST2, and blocks IL-33 binding, thus inhibiting association with the IL-1R accessory protein (AcP) co-receptor and formation of an activated receptor complex.   Key Inclusion Criteria: Able and willing to provide written informed consent and to comply with the study protocol. Ability to comply with the requirements of study protocol, according  to the investigators's best judgement. Completion of the 52-week treatment period in either [place] or Study HA55667.   Key Exclusion Criteria: Significant non- compliance in the parent study, specifically defined as missing scheduled visits, per investigators judgement. Any new clinically significant pulmonary disease other than COPD since enrolling in the study. Unstable cardiac disease, myocardial infarction, or [place]  Heart Association Class III or IV heart failure since enrolling in the parent study.   Integrated Pharmacokinetic/Pharmacodynamic Analysis:  In Studies HA60757, K3610841, and [id number], exploratory biomarker analysis showed there were consistent decreases in blood eosinophil counts throughout the treatment period, potentially mediated by a direct effect of IL-33 on eosinophil progenitors. In [place], there was no significant difference in fractional exhaled nitric oxide (FeNO) levels (reflecting airway IL-4/IL-13 activity) between astegolimab-treated groups relative to placebo throughout the treatment period. These data suggest that astegolimab has only a limited effect on Type 2 inflammation in asthma. No data are applicable for Study [id number]. No data are available yet for Study [id number].   Special Warnings/Considerations: Administration of astegolimab, a protein therapeutic, may lead to the development of anti-astegolimab antibodies which could lead to AEs and/or decreased exposure. In non-clinical studies (see Section 4.2), ADA incidence has generally been low and there was no apparent ADA impact on PK and safety in these studies. To date, the immunogenicity rates observed with astegolimab in clinical studies have been relatively low as well (see Immunogenicity Section 5.6). Several clinical studies have been conducted in patients with asthma, atopic dermatitis, COPD (IIS Study HA59431) and COVID-19 severe pneumonia, which generally showed low incidence of ADAs (Table 31).  There has been no correlation between ADA status and clinical findings or increased incidence of AEs. Astegolimab is now being considered in a larger study for the treatment of  COPD. This patient population is typically considered to have a hyper-responsive immune system. Route of administration for this molecule will be [place], either Q2W or Q4W. These factors increase the risk of development of an immune response to astegolimab, specifically with repeat dosing. From the previous IIS Study HA59431, incidence of ADAs was low in COPD patients; this remains to be confirmed in a larger study. To monitor ADA development in ongoing studies, serum samples will be collected from patients at protocol-defined intervals. Patients who test positive for antibodies and have clinical sequelae that are considered potentially related to an ADA response may also be asked to return for additional follow-up testing.   Drug Interaction Studies: No PK drug interaction studies have been conducted to date.   Serious Adverse Reactions Observed in Asthma Study: During the treatment period, a similar proportion of patients across all cohorts experienced at least one AE, regardless of causality (Table 22). In total, 77.2%, 70.9%, 72.2%, and 72.1% of patients reported at least 1 AE in the placebo, 70 mg, 210 mg, and 490 mg groups, respectively. The most common AE's (>5% in any treatment group) were asthma, nasopharyngitis, upper respiratory tract infection, headache, and injection site reaction (ISR). The most common drug-related AE was ISR, which was reported more frequently in the astegolimab treatment groups than in the placebo group (1 [0.8%] patient in the placebo group, 10 [7.9%] patients with 70 mg, 8 [6.3%] patients with 210 mg, 6 [4.9%] patients with 490 mg). All ISRs were non-serious and mild or moderate in severity. During the treatment period, 50 SAEs were reported in 37 (7.4%) patients. The number of patients reporting SAEs was  comparable across all cohorts (11 SAEs in 8 patients on placebo, 21 SAEs in 14 patients on 70 mg, 11 SAEs in 9 patients on 210 mg, and 7 SAEs in 6 patients on 490 mg). The most common SAE was asthma. One SAE of moderate livedo reticularis (70 mg) was considered a suspected unexpected serious adverse drug reaction (SUSAR) related to astegolimab and was reported two days after the second dose, leading to discontinuation of astegolimab. Two (0.4%) patients reported anaphylaxis and hypersensitivity reactions: 1 severe SAE of anaphylactic reaction (placebo), and 1 moderate hypersensitivity (490 mg) considered related to astegolimab. Three (0.6%) patients experienced a potential Major Adverse Cardiac Event (MACE) (1 patient each in the placebo, 70 mg, and 210 mg groups). None of the potential MACE were considered related to astegolimab. Overall, 233 (46.4%) patients reported events of infection. A comparable number of patients reported infection across all treatment groups (65 [51.2%] patients on placebo, 55 [43.3%] patients on 70 mg, 58 [46.0%] patients on 210 mg, and 55 [45.1%] patients on 490 mg). The most frequently reported infection (>=10% incidence) was nasopharyngitis (12.7%). One patient on astegolimab 210 mg and the partner of one patient on placebo became pregnant during the study. Both delivered normal/healthy babies. Two deaths, unrelated to study drug, were reported: one patient on 210 mg astegolimab died following an SAE of asthma; the other patient on 490 mg astegolimab had an unexplained death. There were no clinically meaningful changes in laboratory parameters, vital signs, or ECG results, other than the 10% decrease in mean blood eosinophil counts in the astegolimab-treated groups, with no safety concerns. Treatment-induced ADAs were comparable between the astegolimab groups and had no impact on safety. Overall, astegolimab was well tolerated at all doses used and had a safety profile consistent with  that observed in the previous astegolimab Phase I studies.   Serious  Adverse Reactions Observed in Previous COPD Study: In the completed IIS Study HA59431, a total of 81 patients received at least one dose of astegolimab or placebo. The safety profile of astegolimab was similar to that of placebo. There were a total of 222 AEs reported in 62 patients. A total of 28 (72%) patients in the placebo arm and 34 (81%) patients in the astegolimab arm reported at least one AE. The most commonly reported AEs were headache followed by urinary tract infection and viral upper respiratory tract infection. A total of 39 SAEs were reported in 28 (35%) patients; 16 (41%) patients in the placebo group and 12 (29%) in the astegolimab group. The most commonly reported SAE was hospital admission for community acquired pneumonia. Four SAEs resulted in patient discontinuation from treatment (1 patient in the placebo group and 3 patients in the astegolimab group). One patient in the placebo group experienced an AESI of potential MACE (heart failure, unrelated to the study treatment). No anaphylaxis or pregnancies were reported. Two deaths, unrelated to study treatment, were reported: 1 patient in the placebo group died after hospital acquired pneumonia, and another patient in the placebo group died after pneumonia and type 2 respiratory failure.   Safety Data: Astegolimab has been generally well tolerated. There have been 49 patient deaths across all astegolimab studies (Section 5.5.2), none of which were considered related to astegolimab. A total of 144 subjects experienced a total of 224 SAEs across all astegolimab studies. Of these, an SAE livedo reticularis observed in 1 patient was considered related to astegolimab by the investigator (Section 5.5.3). AEs leading to withdrawal (Section 5.5.4) have generally occurred at a rate similar to what would be expected for clinical trials in the studies' respective indications.   [facility]  @ [place] Clinical Research Coordinator note:    This visit for Subject: [id number] DOB: [birth year] on 13Aug2025 for the above protocol is Visit/Encounter # week 16 and is for purpose of research.    Subject expressed continued interest and consent in continuing as a study subject. Subject confirmed that there was no change in contact information (e.g. address, telephone, email). Subject thanked for participation in research and contribution to science. The Subject was informed that the PI Dr. Geronimo continues to have oversight of the subject's visits and course through relevant discussions, reviews and specifically of this visit by routing of this note to the PI.  All assessments and procedures were performed per above stated protocol. Subject tolerated injections well. Please refer to subject's paper source binder for further details of this visit.   Signed by Octaviano Paras, CCRP, COT Clinical Research Coordinator  [facility]  [place], [place]

## 2024-02-28 NOTE — Telephone Encounter (Signed)
 Last ov note from Beth indicates pt is to continue o2- 05/15/23. Copy of this note faxed to Natalya at the number provided.

## 2024-02-28 NOTE — Telephone Encounter (Signed)
 Duplicate already called patient and sent it over to sarah for dod

## 2024-03-01 ENCOUNTER — Ambulatory Visit (HOSPITAL_BASED_OUTPATIENT_CLINIC_OR_DEPARTMENT_OTHER): Admitting: Cardiology

## 2024-03-01 MED ORDER — PREDNISONE 10 MG PO TABS: 40.0000 mg | ORAL_TABLET | Freq: Every day | ORAL | 0 refills | Status: DC

## 2024-03-01 MED ORDER — AZITHROMYCIN 250 MG PO TABS: ORAL_TABLET | ORAL | 0 refills | Status: DC

## 2024-03-01 NOTE — Telephone Encounter (Signed)
**Note De-identified  [place] Obfuscation** Please advise 

## 2024-03-06 ENCOUNTER — Encounter: Admitting: Nurse Practitioner

## 2024-03-07 ENCOUNTER — Encounter

## 2024-03-11 ENCOUNTER — Ambulatory Visit: Admitting: Physician Assistant

## 2024-03-11 ENCOUNTER — Other Ambulatory Visit: Payer: Self-pay | Admitting: Cardiology

## 2024-03-11 VITALS — BP 153/75 | HR 75 | Wt 165.0 lb

## 2024-03-11 DIAGNOSIS — I2089 Other forms of angina pectoris: Secondary | ICD-10-CM

## 2024-03-11 DIAGNOSIS — N3946 Mixed incontinence: Secondary | ICD-10-CM | POA: Diagnosis not present

## 2024-03-11 NOTE — Progress Notes (Signed)
 PTNS  Session # 20  Health & Social Factors: no change Caffeine: 2-3 Alcohol: 0 Daytime voids #per day: 3 Night-time voids #per night: 2 Urgency: mild Incontinence Episodes #per day: 3 Ankle used: right Treatment Setting: 9 Feeling/ Response: Toe flex/Sensory Comments: patient tolerated well, no complaints   Performed By: Beauford Browner, CCMA  Follow Up: 1 month follow up PTNS maintenance

## 2024-03-11 NOTE — Telephone Encounter (Signed)
**Note De-identified  [place] Obfuscation** Please advise 

## 2024-03-12 ENCOUNTER — Telehealth: Payer: Self-pay | Admitting: Internal Medicine

## 2024-03-12 ENCOUNTER — Other Ambulatory Visit (HOSPITAL_COMMUNITY): Payer: Self-pay

## 2024-03-12 ENCOUNTER — Other Ambulatory Visit: Payer: Self-pay

## 2024-03-12 DIAGNOSIS — R053 Chronic cough: Secondary | ICD-10-CM

## 2024-03-12 MED ORDER — ISOSORBIDE MONONITRATE ER 30 MG PO TB24
30.0000 mg | ORAL_TABLET | ORAL | 11 refills | Status: AC
  Filled 2024-03-12: qty 30, 30d supply, fill #0
  Filled 2024-04-08: qty 30, 30d supply, fill #1
  Filled 2024-05-03 – 2024-05-22 (×2): qty 30, 30d supply, fill #2
  Filled 2024-06-05 – 2024-06-16 (×2): qty 30, 30d supply, fill #3
  Filled 2024-07-16 – 2024-08-07 (×2): qty 30, 30d supply, fill #4
  Filled 2024-08-21: qty 30, 30d supply, fill #5
  Filled ????-??-??: fill #5

## 2024-03-12 MED ORDER — DOXYCYCLINE HYCLATE 100 MG PO TABS: 100.0000 mg | ORAL_TABLET | Freq: Two times a day (BID) | ORAL | 0 refills | Status: DC

## 2024-03-12 MED ORDER — PREDNISONE 10 MG PO TABS: ORAL_TABLET | ORAL | 0 refills | Status: AC

## 2024-03-12 NOTE — Telephone Encounter (Signed)
 Spoke with patient regarding prior message . Advised patient per [facility]   We have to redo antibiotic and prednisone  for AECOPD             - Take doxycycline  100mg  po twice daily x 5 days; take after meals and avoid sunlight (needs to be different from what dr Kara sent )               -= Please take Take prednisone  40mg  once daily x 3 days, then 30mg  once daily x 3 days, then 20mg  once daily x 3 days, then prednisone  10mg  once daily  x 3 days and then prednisone  5mg  daily x 3 days and stop (longer taper)   Ensure there Is a standard of care visit with me in next 8 weeks      Patient has been scheduled for a 8 week f/u . Patient's voice was understanding. Nothing else further needed.

## 2024-03-12 NOTE — Telephone Encounter (Signed)
  We have to redo antibiotic and prednisone  for AECOPD  - Take doxycycline  100mg  po twice daily x 5 days; take after meals and avoid sunlight (needs to be different from what dr Kara sent )   -= Please take Take prednisone  40mg  once daily x 3 days, then 30mg  once daily x 3 days, then 20mg  once daily x 3 days, then prednisone  10mg  once daily  x 3 days and then prednisone  5mg  daily x 3 days and stop (longer taper)  Ensure there Is a standard of care visit with me in next 8 weeks    Allergies  Allergen Reactions   Montelukast      Bad dreams

## 2024-03-13 ENCOUNTER — Encounter: Admitting: Internal Medicine

## 2024-03-13 DIAGNOSIS — Z006 Encounter for examination for normal comparison and control in clinical research program: Secondary | ICD-10-CM

## 2024-03-13 MED ORDER — STUDY - ARNASA GB43374 (OPEN-LABEL) - ASTEGOLIMAB 238 MG/1.7 ML SQ INJECTION (PI-RAMASWAMY)
476.0000 mg | INJECTION | SUBCUTANEOUS | Status: AC
  Administered 2024-03-13: 476 mg via SUBCUTANEOUS
  Filled 2024-03-13: qty 3.4

## 2024-03-13 NOTE — Research (Signed)
 Title: A phase III Open-Label extension study to evaluate the long-term safety of Astegolimab in patients with Chronic Obstructive Pulmonary disease.   Dose and Duration of Treatment: Astegolimab is presented as a sterile, slightly brown-yellow solution. Each single-use, 2.25 mL pre-filled syringe contains 1.7 mL deliverable volume. Astegolimab drug product is formulated at 140 mg/mL astegolimab with 114 mM succinic acid, 200 mM L-arginine, 10 mM L-methionine, 0.06% (w/v) polysorbate 20, pH 5.7. Placebo for astegolimab is supplied in an identical pre-filled syringe configuration.   Protocol # [id number] Sponsor: [employer] [address], [place]   Protocol Version: v3 dated 15Oct2024 IB: version 9.0 dated April 2024 ICF:  ICFs: Make sure that the subject is re-consented with the updated ICFs Main ICF: [employer] IRB Approved Version 15 Jun 2023, Revised 28Nov2024 Mobile Nursing: [employer] IR[facility] Approved Version 26Oct2023, Revised 08Aug2024 Optional Home Study Treatment: IRB Approved Version 12 May 2022 Revised 23 Feb 2023 ADDENDUM 1: IRB Approved Version 24Jul2024 Revised 8Aug2024 Lab Manual: V2.1.0 dated 21Jun2024   Mechanism of action: Astegolimab (also known as [id number] or [id number]) is a fully human, IgG2 monoclonal antibody that binds with high affinity to the interleukin (IL)-33 (IL-33) receptor, ST2, thereby blocking the signaling of IL-33, an inflammatory cytokine of the IL-1 family and member of the alarmin class of molecules. Astegolimab binds with high affinity to the human and cynomolgus monkey receptor for IL-33, ST2, and blocks IL-33 binding, thus inhibiting association with the IL-1R accessory protein (AcP) co-receptor and formation of an activated receptor complex.   Key Inclusion Criteria: Able and willing to provide written informed consent and to comply with the study protocol. Ability to comply with the requirements of study protocol, according  to the investigators's best judgement. Completion of the 52-week treatment period in either [place] or Study HA55667.   Key Exclusion Criteria: Significant non- compliance in the parent study, specifically defined as missing scheduled visits, per investigators judgement. Any new clinically significant pulmonary disease other than COPD since enrolling in the study. Unstable cardiac disease, myocardial infarction, or [place]  Heart Association Class III or IV heart failure since enrolling in the parent study.   Integrated Pharmacokinetic/Pharmacodynamic Analysis:  In Studies HA60757, [id number], and [id number], exploratory biomarker analysis showed there were consistent decreases in blood eosinophil counts throughout the treatment period, potentially mediated by a direct effect of IL-33 on eosinophil progenitors. In [place], there was no significant difference in fractional exhaled nitric oxide (FeNO) levels (reflecting airway IL-4/IL-13 activity) between astegolimab-treated groups relative to placebo throughout the treatment period. These data suggest that astegolimab has only a limited effect on Type 2 inflammation in asthma. No data are applicable for Study [id number]. No data are available yet for Study [id number].   Special Warnings/Considerations: Administration of astegolimab, a protein therapeutic, may lead to the development of anti-astegolimab antibodies which could lead to AEs and/or decreased exposure. In non-clinical studies (see Section 4.2), ADA incidence has generally been low and there was no apparent ADA impact on PK and safety in these studies. To date, the immunogenicity rates observed with astegolimab in clinical studies have been relatively low as well (see Immunogenicity Section 5.6). Several clinical studies have been conducted in patients with asthma, atopic dermatitis, COPD (IIS Study HA59431) and COVID-19 severe pneumonia, which generally showed low incidence of ADAs (Table 31).  There has been no correlation between ADA status and clinical findings or increased incidence of AEs. Astegolimab is now being considered in a larger study for the treatment of  COPD. This patient population is typically considered to have a hyper-responsive immune system. Route of administration for this molecule will be [place], either Q2W or Q4W. These factors increase the risk of development of an immune response to astegolimab, specifically with repeat dosing. From the previous IIS Study HA59431, incidence of ADAs was low in COPD patients; this remains to be confirmed in a larger study. To monitor ADA development in ongoing studies, serum samples will be collected from patients at protocol-defined intervals. Patients who test positive for antibodies and have clinical sequelae that are considered potentially related to an ADA response may also be asked to return for additional follow-up testing.   Drug Interaction Studies: No PK drug interaction studies have been conducted to date.   Serious Adverse Reactions Observed in Asthma Study: During the treatment period, a similar proportion of patients across all cohorts experienced at least one AE, regardless of causality (Table 22). In total, 77.2%, 70.9%, 72.2%, and 72.1% of patients reported at least 1 AE in the placebo, 70 mg, 210 mg, and 490 mg groups, respectively. The most common AE's (>5% in any treatment group) were asthma, nasopharyngitis, upper respiratory tract infection, headache, and injection site reaction (ISR). The most common drug-related AE was ISR, which was reported more frequently in the astegolimab treatment groups than in the placebo group (1 [0.8%] patient in the placebo group, 10 [7.9%] patients with 70 mg, 8 [6.3%] patients with 210 mg, 6 [4.9%] patients with 490 mg). All ISRs were non-serious and mild or moderate in severity. During the treatment period, 50 SAEs were reported in 37 (7.4%) patients. The number of patients reporting SAEs was  comparable across all cohorts (11 SAEs in 8 patients on placebo, 21 SAEs in 14 patients on 70 mg, 11 SAEs in 9 patients on 210 mg, and 7 SAEs in 6 patients on 490 mg). The most common SAE was asthma. One SAE of moderate livedo reticularis (70 mg) was considered a suspected unexpected serious adverse drug reaction (SUSAR) related to astegolimab and was reported two days after the second dose, leading to discontinuation of astegolimab. Two (0.4%) patients reported anaphylaxis and hypersensitivity reactions: 1 severe SAE of anaphylactic reaction (placebo), and 1 moderate hypersensitivity (490 mg) considered related to astegolimab. Three (0.6%) patients experienced a potential Major Adverse Cardiac Event (MACE) (1 patient each in the placebo, 70 mg, and 210 mg groups). None of the potential MACE were considered related to astegolimab. Overall, 233 (46.4%) patients reported events of infection. A comparable number of patients reported infection across all treatment groups (65 [51.2%] patients on placebo, 55 [43.3%] patients on 70 mg, 58 [46.0%] patients on 210 mg, and 55 [45.1%] patients on 490 mg). The most frequently reported infection (>=10% incidence) was nasopharyngitis (12.7%). One patient on astegolimab 210 mg and the partner of one patient on placebo became pregnant during the study. Both delivered normal/healthy babies. Two deaths, unrelated to study drug, were reported: one patient on 210 mg astegolimab died following an SAE of asthma; the other patient on 490 mg astegolimab had an unexplained death. There were no clinically meaningful changes in laboratory parameters, vital signs, or ECG results, other than the 10% decrease in mean blood eosinophil counts in the astegolimab-treated groups, with no safety concerns. Treatment-induced ADAs were comparable between the astegolimab groups and had no impact on safety. Overall, astegolimab was well tolerated at all doses used and had a safety profile consistent with  that observed in the previous astegolimab Phase I studies.   Serious  Adverse Reactions Observed in Previous COPD Study: In the completed IIS Study HA59431, a total of 81 patients received at least one dose of astegolimab or placebo. The safety profile of astegolimab was similar to that of placebo. There were a total of 222 AEs reported in 62 patients. A total of 28 (72%) patients in the placebo arm and 34 (81%) patients in the astegolimab arm reported at least one AE. The most commonly reported AEs were headache followed by urinary tract infection and viral upper respiratory tract infection. A total of 39 SAEs were reported in 28 (35%) patients; 16 (41%) patients in the placebo group and 12 (29%) in the astegolimab group. The most commonly reported SAE was hospital admission for community acquired pneumonia. Four SAEs resulted in patient discontinuation from treatment (1 patient in the placebo group and 3 patients in the astegolimab group). One patient in the placebo group experienced an AESI of potential MACE (heart failure, unrelated to the study treatment). No anaphylaxis or pregnancies were reported. Two deaths, unrelated to study treatment, were reported: 1 patient in the placebo group died after hospital acquired pneumonia, and another patient in the placebo group died after pneumonia and type 2 respiratory failure.   Safety Data: Astegolimab has been generally well tolerated. There have been 49 patient deaths across all astegolimab studies (Section 5.5.2), none of which were considered related to astegolimab. A total of 144 subjects experienced a total of 224 SAEs across all astegolimab studies. Of these, an SAE livedo reticularis observed in 1 patient was considered related to astegolimab by the investigator (Section 5.5.3). AEs leading to withdrawal (Section 5.5.4) have generally occurred at a rate similar to what would be expected for clinical trials in the studies' respective indications.   [facility]  @ [place] Clinical Research Coordinator note:    This visit for Subject: Maria [name] DOB: 1955/12/30 on 27Aug2025 for the above protocol is Visit/Encounter # week 18 and is for purpose of research.    Subject expressed continued interest and consent in continuing as a study subject. Subject confirmed that there was no change in contact information (e.g. address, telephone, email). Subject thanked for participation in research and contribution to science. The Subject was informed that the PI Dr. Geronimo continues to have oversight of the subject's visits and course through relevant discussions, reviews and specifically of this visit by routing of this note to the PI.  The subject acknowledged she is taking the antibiotic and prednisone  medicines that Dr. Geronimo had prescribed to her for a cough and mucus on 26Aug2025  All assessments and procedures were performed per above stated protocol. Subject tolerated injections well. Please refer to subject's paper source binder for further details of this visit.   Signed by Octaviano Paras, CCRP, COT Clinical Research Coordinator  [facility]  [place], [place]

## 2024-03-27 ENCOUNTER — Encounter

## 2024-03-27 MED ORDER — STUDY - ARNASA GB43374 (OPEN-LABEL) - ASTEGOLIMAB 238 MG/1.7 ML SQ INJECTION (PI-RAMASWAMY)
476.0000 mg | INJECTION | SUBCUTANEOUS | Status: AC
  Administered 2024-03-27: 476 mg via SUBCUTANEOUS
  Filled 2024-03-27: qty 3.4

## 2024-03-27 NOTE — Research (Unsigned)
 Title: A phase III Open-Label extension study to evaluate the long-term safety of Astegolimab in patients with Chronic Obstructive Pulmonary disease.   Dose and Duration of Treatment: Astegolimab is presented as a sterile, slightly brown-yellow solution. Each single-use, 2.25 mL pre-filled syringe contains 1.7 mL deliverable volume. Astegolimab drug product is formulated at 140 mg/mL astegolimab with 114 mM succinic acid, 200 mM L-arginine, 10 mM L-methionine, 0.06% (w/v) polysorbate 20, pH 5.7. Placebo for astegolimab is supplied in an identical pre-filled syringe configuration.   Protocol # [id number] Sponsor: [employer] [address], [place]   Protocol Version: v3 dated 15Oct2024 IB: version 9.0 dated April 2024 ICF:  ICFs: Make sure that the subject is re-consented with the updated ICFs Main ICF: [facility] Approved Version 15 Jun 2023, Revised 28Nov2024 Mobile Nursing: [facility] Approved Version 26Oct2023, Revised 08Aug2024 Optional Home Study Treatment: IRB Approved Version 12 May 2022 Revised 23 Feb 2023 ADDENDUM 1: IRB Approved Version 24Jul2024 Revised 8Aug2024 Lab Manual: V2.1.0 dated 21Jun2024   Mechanism of action: Astegolimab (also known as [id number] or [id number]) is a fully human, IgG2 monoclonal antibody that binds with high affinity to the interleukin (IL)-33 (IL-33) receptor, ST2, thereby blocking the signaling of IL-33, an inflammatory cytokine of the IL-1 family and member of the alarmin class of molecules. Astegolimab binds with high affinity to the human and cynomolgus monkey receptor for IL-33, ST2, and blocks IL-33 binding, thus inhibiting association with the IL-1R accessory protein (AcP) co-receptor and formation of an activated receptor complex.   Key Inclusion Criteria: Able and willing to provide written informed consent and to comply with the study protocol. Ability to comply with the requirements of study protocol, according  to the investigators's best judgement. Completion of the 52-week treatment period in either [place] or Study HA55667.   Key Exclusion Criteria: Significant non- compliance in the parent study, specifically defined as missing scheduled visits, per investigators judgement. Any new clinically significant pulmonary disease other than COPD since enrolling in the study. Unstable cardiac disease, myocardial infarction, or [place]  Heart Association Class III or IV heart failure since enrolling in the parent study.   Integrated Pharmacokinetic/Pharmacodynamic Analysis:  In Studies HA60757, [id number], and [id number], exploratory biomarker analysis showed there were consistent decreases in blood eosinophil counts throughout the treatment period, potentially mediated by a direct effect of IL-33 on eosinophil progenitors. In [place], there was no significant difference in fractional exhaled nitric oxide (FeNO) levels (reflecting airway IL-4/IL-13 activity) between astegolimab-treated groups relative to placebo throughout the treatment period. These data suggest that astegolimab has only a limited effect on Type 2 inflammation in asthma. No data are applicable for Study HJ57530. No data are available yet for Study HA56688.   Special Warnings/Considerations: Administration of astegolimab, a protein therapeutic, may lead to the development of anti-astegolimab antibodies which could lead to AEs and/or decreased exposure. In non-clinical studies (see Section 4.2), ADA incidence has generally been low and there was no apparent ADA impact on PK and safety in these studies. To date, the immunogenicity rates observed with astegolimab in clinical studies have been relatively low as well (see Immunogenicity Section 5.6). Several clinical studies have been conducted in patients with asthma, atopic dermatitis, COPD (IIS Study HA59431) and COVID-19 severe pneumonia, which generally showed low incidence of ADAs (Table 31).  There has been no correlation between ADA status and clinical findings or increased incidence of AEs. Astegolimab is now being considered in a larger study for the treatment of  COPD. This patient population is typically considered to have a hyper-responsive immune system. Route of administration for this molecule will be [place], either Q2W or Q4W. These factors increase the risk of development of an immune response to astegolimab, specifically with repeat dosing. From the previous IIS Study HA59431, incidence of ADAs was low in COPD patients; this remains to be confirmed in a larger study. To monitor ADA development in ongoing studies, serum samples will be collected from patients at protocol-defined intervals. Patients who test positive for antibodies and have clinical sequelae that are considered potentially related to an ADA response may also be asked to return for additional follow-up testing.   Drug Interaction Studies: No PK drug interaction studies have been conducted to date.   Serious Adverse Reactions Observed in Asthma Study: During the treatment period, a similar proportion of patients across all cohorts experienced at least one AE, regardless of causality (Table 22). In total, 77.2%, 70.9%, 72.2%, and 72.1% of patients reported at least 1 AE in the placebo, 70 mg, 210 mg, and 490 mg groups, respectively. The most common AE's (>5% in any treatment group) were asthma, nasopharyngitis, upper respiratory tract infection, headache, and injection site reaction (ISR). The most common drug-related AE was ISR, which was reported more frequently in the astegolimab treatment groups than in the placebo group (1 [0.8%] patient in the placebo group, 10 [7.9%] patients with 70 mg, 8 [6.3%] patients with 210 mg, 6 [4.9%] patients with 490 mg). All ISRs were non-serious and mild or moderate in severity. During the treatment period, 50 SAEs were reported in 37 (7.4%) patients. The number of patients reporting SAEs was  comparable across all cohorts (11 SAEs in 8 patients on placebo, 21 SAEs in 14 patients on 70 mg, 11 SAEs in 9 patients on 210 mg, and 7 SAEs in 6 patients on 490 mg). The most common SAE was asthma. One SAE of moderate livedo reticularis (70 mg) was considered a suspected unexpected serious adverse drug reaction (SUSAR) related to astegolimab and was reported two days after the second dose, leading to discontinuation of astegolimab. Two (0.4%) patients reported anaphylaxis and hypersensitivity reactions: 1 severe SAE of anaphylactic reaction (placebo), and 1 moderate hypersensitivity (490 mg) considered related to astegolimab. Three (0.6%) patients experienced a potential Major Adverse Cardiac Event (MACE) (1 patient each in the placebo, 70 mg, and 210 mg groups). None of the potential MACE were considered related to astegolimab. Overall, 233 (46.4%) patients reported events of infection. A comparable number of patients reported infection across all treatment groups (65 [51.2%] patients on placebo, 55 [43.3%] patients on 70 mg, 58 [46.0%] patients on 210 mg, and 55 [45.1%] patients on 490 mg). The most frequently reported infection (>=10% incidence) was nasopharyngitis (12.7%). One patient on astegolimab 210 mg and the partner of one patient on placebo became pregnant during the study. Both delivered normal/healthy babies. Two deaths, unrelated to study drug, were reported: one patient on 210 mg astegolimab died following an SAE of asthma; the other patient on 490 mg astegolimab had an unexplained death. There were no clinically meaningful changes in laboratory parameters, vital signs, or ECG results, other than the 10% decrease in mean blood eosinophil counts in the astegolimab-treated groups, with no safety concerns. Treatment-induced ADAs were comparable between the astegolimab groups and had no impact on safety. Overall, astegolimab was well tolerated at all doses used and had a safety profile consistent with  that observed in the previous astegolimab Phase I studies.   Serious  Adverse Reactions Observed in Previous COPD Study: In the completed IIS Study HA59431, a total of 81 patients received at least one dose of astegolimab or placebo. The safety profile of astegolimab was similar to that of placebo. There were a total of 222 AEs reported in 62 patients. A total of 28 (72%) patients in the placebo arm and 34 (81%) patients in the astegolimab arm reported at least one AE. The most commonly reported AEs were headache followed by urinary tract infection and viral upper respiratory tract infection. A total of 39 SAEs were reported in 28 (35%) patients; 16 (41%) patients in the placebo group and 12 (29%) in the astegolimab group. The most commonly reported SAE was hospital admission for community acquired pneumonia. Four SAEs resulted in patient discontinuation from treatment (1 patient in the placebo group and 3 patients in the astegolimab group). One patient in the placebo group experienced an AESI of potential MACE (heart failure, unrelated to the study treatment). No anaphylaxis or pregnancies were reported. Two deaths, unrelated to study treatment, were reported: 1 patient in the placebo group died after hospital acquired pneumonia, and another patient in the placebo group died after pneumonia and type 2 respiratory failure.   Safety Data: Astegolimab has been generally well tolerated. There have been 49 patient deaths across all astegolimab studies (Section 5.5.2), none of which were considered related to astegolimab. A total of 144 subjects experienced a total of 224 SAEs across all astegolimab studies. Of these, an SAE livedo reticularis observed in 1 patient was considered related to astegolimab by the investigator (Section 5.5.3). AEs leading to withdrawal (Section 5.5.4) have generally occurred at a rate similar to what would be expected for clinical trials in the studies' respective indications.   [facility]  @ [place] Clinical Research Coordinator note:    This visit for Subject: Maria [name] DOB: [birth year] on 10Sep2025 for the above protocol is Visit/Encounter # week 20 and is for purpose of research.    Subject expressed continued interest and consent in continuing as a study subject. Subject confirmed that there was no change in contact information (e.g. address, telephone, email). Subject thanked for participation in research and contribution to science. The Subject was informed that the PI Dr. Geronimo continues to have oversight of the subject's visits and course through relevant discussions, reviews and specifically of this visit by routing of this note to the PI.  All assessments and procedures were performed per above stated protocol. Subject tolerated injections well. Please refer to subject's paper source binder for further details of this visit.   Signed by Maria [name], CCRP, COT Clinical Research Coordinator  [facility]  [place], [place]

## 2024-03-29 ENCOUNTER — Encounter: Payer: Self-pay | Admitting: Nurse Practitioner

## 2024-03-29 ENCOUNTER — Ambulatory Visit: Admitting: Nurse Practitioner

## 2024-03-29 ENCOUNTER — Other Ambulatory Visit: Payer: Self-pay | Admitting: Nurse Practitioner

## 2024-03-29 VITALS — BP 138/60 | HR 60 | Temp 97.5°F | Ht 65.0 in | Wt 173.2 lb

## 2024-03-29 DIAGNOSIS — I6522 Occlusion and stenosis of left carotid artery: Secondary | ICD-10-CM

## 2024-03-29 DIAGNOSIS — Z23 Encounter for immunization: Secondary | ICD-10-CM | POA: Diagnosis not present

## 2024-03-29 DIAGNOSIS — F419 Anxiety disorder, unspecified: Secondary | ICD-10-CM

## 2024-03-29 DIAGNOSIS — R7303 Prediabetes: Secondary | ICD-10-CM | POA: Diagnosis not present

## 2024-03-29 DIAGNOSIS — Z0000 Encounter for general adult medical examination without abnormal findings: Secondary | ICD-10-CM

## 2024-03-29 DIAGNOSIS — Z780 Asymptomatic menopausal state: Secondary | ICD-10-CM | POA: Diagnosis not present

## 2024-03-29 DIAGNOSIS — I700 Atherosclerosis of aorta: Secondary | ICD-10-CM | POA: Diagnosis not present

## 2024-03-29 DIAGNOSIS — E785 Hyperlipidemia, unspecified: Secondary | ICD-10-CM

## 2024-03-29 DIAGNOSIS — G479 Sleep disorder, unspecified: Secondary | ICD-10-CM

## 2024-03-29 DIAGNOSIS — I739 Peripheral vascular disease, unspecified: Secondary | ICD-10-CM

## 2024-03-29 DIAGNOSIS — J449 Chronic obstructive pulmonary disease, unspecified: Secondary | ICD-10-CM | POA: Diagnosis not present

## 2024-03-29 DIAGNOSIS — Z1231 Encounter for screening mammogram for malignant neoplasm of breast: Secondary | ICD-10-CM

## 2024-03-29 DIAGNOSIS — Z13820 Encounter for screening for osteoporosis: Secondary | ICD-10-CM

## 2024-03-29 DIAGNOSIS — F320 Major depressive disorder, single episode, mild: Secondary | ICD-10-CM | POA: Diagnosis not present

## 2024-03-29 DIAGNOSIS — Z87891 Personal history of nicotine dependence: Secondary | ICD-10-CM | POA: Diagnosis not present

## 2024-03-29 DIAGNOSIS — J9611 Chronic respiratory failure with hypoxia: Secondary | ICD-10-CM

## 2024-03-29 LAB — CBC WITH DIFFERENTIAL/PLATELET
Basophils Absolute: 0 K/uL (ref 0.0–0.1)
Basophils Relative: 0.5 % (ref 0.0–3.0)
Eosinophils Absolute: 0.2 K/uL (ref 0.0–0.7)
Eosinophils Relative: 2 % (ref 0.0–5.0)
HCT: 33.6 % — ABNORMAL LOW (ref 36.0–46.0)
Hemoglobin: 11.2 g/dL — ABNORMAL LOW (ref 12.0–15.0)
Lymphocytes Relative: 25.7 % (ref 12.0–46.0)
Lymphs Abs: 2.2 K/uL (ref 0.7–4.0)
MCHC: 33.4 g/dL (ref 30.0–36.0)
MCV: 92.1 fl (ref 78.0–100.0)
Monocytes Absolute: 0.6 K/uL (ref 0.1–1.0)
Monocytes Relative: 7 % (ref 3.0–12.0)
Neutro Abs: 5.5 K/uL (ref 1.4–7.7)
Neutrophils Relative %: 64.8 % (ref 43.0–77.0)
Platelets: 327 K/uL (ref 150.0–400.0)
RBC: 3.65 Mil/uL — ABNORMAL LOW (ref 3.87–5.11)
RDW: 13.1 % (ref 11.5–15.5)
WBC: 8.4 K/uL (ref 4.0–10.5)

## 2024-03-29 LAB — HEMOGLOBIN A1C: Hgb A1c MFr Bld: 6.4 % (ref 4.6–6.5)

## 2024-03-29 LAB — COMPREHENSIVE METABOLIC PANEL WITH GFR
ALT: 16 U/L (ref 0–35)
AST: 18 U/L (ref 0–37)
Albumin: 3.8 g/dL (ref 3.5–5.2)
Alkaline Phosphatase: 86 U/L (ref 39–117)
BUN: 14 mg/dL (ref 6–23)
CO2: 32 meq/L (ref 19–32)
Calcium: 9.5 mg/dL (ref 8.4–10.5)
Chloride: 106 meq/L (ref 96–112)
Creatinine, Ser: 0.92 mg/dL (ref 0.40–1.20)
GFR: 64.61 mL/min (ref >60.00)
Glucose, Bld: 87 mg/dL (ref 70–99)
Potassium: 4 meq/L (ref 3.5–5.1)
Sodium: 144 meq/L (ref 135–145)
Total Bilirubin: 0.4 mg/dL (ref 0.2–1.2)
Total Protein: 6.4 g/dL (ref 6.0–8.3)

## 2024-03-29 LAB — URINALYSIS, MICROSCOPIC ONLY

## 2024-03-29 LAB — LIPID PANEL
Cholesterol: 161 mg/dL (ref 0–200)
HDL: 68 mg/dL (ref >39.00)
LDL Cholesterol: 76 mg/dL (ref 0–99)
NonHDL: 92.78
Total CHOL/HDL Ratio: 2
Triglycerides: 82 mg/dL (ref 0.0–149.0)
VLDL: 16.4 mg/dL (ref 0.0–40.0)

## 2024-03-29 LAB — TSH: TSH: 1.14 u[IU]/mL (ref 0.35–5.50)

## 2024-03-29 MED ORDER — SERTRALINE HCL 50 MG PO TABS: 50.0000 mg | ORAL_TABLET | Freq: Every day | ORAL | 1 refills | Status: DC

## 2024-03-29 NOTE — Assessment & Plan Note (Signed)
 Currently maintained on atorvastatin  20 mg daily

## 2024-03-29 NOTE — Assessment & Plan Note (Signed)
 Has tried multiple medications in the past.  Will discontinue escitalopram  and start patient on sertraline  50 mg 1-1 transition.

## 2024-03-29 NOTE — Assessment & Plan Note (Signed)
 Pending A1c

## 2024-03-29 NOTE — Assessment & Plan Note (Signed)
 Discussed age-appropriate immunizations and screening exams.  Did review patient's personal, surgical, social, family histories.  Patient is up-to-date with all age-appropriate vaccinations she would like.  Update flu vaccine today.  Patient is overdue for CRC screening.  Ambulatory order placed today.  Patient is due for breast cancer screening and bone density scan screening.  Orders placed today.  Patient was given information at discharge about preventative healthcare maintenance with anticipatory guidance.

## 2024-03-29 NOTE — Progress Notes (Signed)
 Established Patient Office Visit  Subjective   Patient ID: Maria [name], female    DOB: Dec 29, 1955  Age: 68 y.o. MRN: [id number]  Chief Complaint  Patient presents with   Annual Exam    Flu vaccine     HPI  Chronic respiratory failure: Followed by pulmonology maintained on oxygen , DuoNeb, albuterol , Breztri , Roflumilast .  Carotid occlusion: Patient currently followed by vascular maintained on atorvastatin  20 mg daily and clopidogrel  75 mg daily  OAB: Followed by urology currently maintained on oxybutynin  10 mg daily  Angina: Patient followed by cardiology currently maintained on isosorbide  30 mg daily along with metoprolol  25 mg daily  Mood: Currently maintained on Lexapro  20 mg daily. Someties it is ok and sometimes not. She is not sleeping well. She will sleep   Chronic pain: Patient followed by pain management clinic currently maintained on gabapentin  600 mg 3 times daily  for complete physical and follow up of chronic conditions.  Immunizations: -Tetanus: Completed in 2024 -Influenza: Updated today -Shingles: Completed Shingrix series -Pneumonia: Completed 2023  Diet: Fair diet. She is eating at least 2 meals a day. She does snack. She is drinking coffee water and tea Exercise: No regular exercise.  Eye exam: annually with glasses Dentist: dentures as needed  Colonoscopy: Completed in 11/01/2021, repeat 2 weeks Lung Cancer Screening: Completed in   Pap smear: Hysterectomy, aged out  Mammogram: 04/04/2023, due  DEXA: 12/13/2021, normal   Advanced directive: does have one     Review of Systems  Constitutional:  Negative for chills and fever.  Respiratory:  Positive for cough and shortness of breath.   Cardiovascular:  Negative for chest pain and leg swelling.  Gastrointestinal:  Negative for abdominal pain, blood in stool, constipation, diarrhea, nausea and vomiting.       BM every other day to every 3rd   Genitourinary:  Negative for dysuria and  hematuria.  Neurological:  Positive for dizziness. Negative for tingling and headaches.  Psychiatric/Behavioral:  Negative for hallucinations and suicidal ideas.       Objective:     BP 138/60   Pulse 60   Temp (!) 97.5 F (36.4 C) (Oral)   Ht 5' 5 (1.651 m)   Wt 173 lb 3.2 oz (78.6 kg)   SpO2 97%   BMI 28.82 kg/m  BP Readings from Last 3 Encounters:  03/29/24 138/60  03/11/24 (!) 153/75  02/27/24 120/70   Wt Readings from Last 3 Encounters:  03/29/24 173 lb 3.2 oz (78.6 kg)  03/11/24 165 lb (74.8 kg)  02/27/24 165 lb (74.8 kg)   SpO2 Readings from Last 3 Encounters:  03/29/24 97%  03/11/24 96%  01/24/24 95%      Physical Exam Vitals and nursing note reviewed.  Constitutional:      Appearance: Normal appearance.  HENT:     Right Ear: Tympanic membrane, ear canal and external ear normal.     Left Ear: Tympanic membrane, ear canal and external ear normal.     Mouth/Throat:     Mouth: Mucous membranes are moist.     Pharynx: Oropharynx is clear.  Eyes:     Extraocular Movements: Extraocular movements intact.     Pupils: Pupils are equal, round, and reactive to light.  Cardiovascular:     Rate and Rhythm: Normal rate and regular rhythm.     Pulses: Normal pulses.     Heart sounds: Normal heart sounds.  Pulmonary:     Effort: Pulmonary effort is normal.  Comments: Greatly decreased Abdominal:     General: Bowel sounds are normal. There is no distension.     Palpations: There is no mass.     Tenderness: There is no abdominal tenderness.     Hernia: No hernia is present.  Musculoskeletal:     Right lower leg: No edema.     Left lower leg: No edema.  Lymphadenopathy:     Cervical: No cervical adenopathy.  Skin:    General: Skin is warm.  Neurological:     General: No focal deficit present.     Mental Status: She is alert.     Deep Tendon Reflexes:     Reflex Scores:      Bicep reflexes are 2+ on the right side and 2+ on the left side.      Patellar  reflexes are 2+ on the right side and 2+ on the left side.    Comments: Bilateral upper and lower extremity strength 5/5  Psychiatric:        Mood and Affect: Mood normal.        Behavior: Behavior normal.        Thought Content: Thought content normal.        Judgment: Judgment normal.      No results found for any visits on 03/29/24.    The ASCVD Risk score (Arnett DK, et al., 2019) failed to calculate for the following reasons:   The valid total cholesterol range is 130 to 320 mg/dL    Assessment & Plan:   Problem List Items Addressed This Visit       Cardiovascular and Mediastinum   Aortic atherosclerosis (HCC)   Currently maintained on atorvastatin  20 mg daily      Relevant Orders   Lipid panel   PVD (peripheral vascular disease) (HCC) - Primary   History of same followed by cardiology.  Currently maintained on atorvastatin  20 mg daily      Symptomatic carotid artery stenosis, left   History of the same followed by vascular surgery has underwent 2 procedures currently maintained on aspirin  and clopidogrel .  Continue taking medication as prescribed follow-up with specialist as recommended        Respiratory   Stage 2 moderate COPD by GOLD classification (HCC)   Patient currently on continuous oxygen  followed by pulmonology maintained on Breztri  Roflumilast  and albuterol  as needed.  Continue taking medication as prescribed follow-up with specialist as recommended      Chronic respiratory failure with hypoxia (HCC)   Followed by pulmonology maintained on continuous oxygen         Other   Prediabetes   Pending A1c      Relevant Orders   Hemoglobin A1c   Hyperlipidemia   Currently maintained on atorvastatin  20 mg pending lipid panel      Relevant Orders   Hemoglobin A1c   Lipid panel   Anxiety   Discontinue escitalopram  and start sertraline  50 mg daily      Relevant Medications   sertraline  (ZOLOFT ) 50 MG tablet   Other Relevant Orders   TSH    Smoking history   Pending urine microscopy rule out microscopic hematuria      Relevant Orders   Urine Microscopic   Current mild episode of major depressive disorder without prior episode (HCC)   Has tried multiple medications in the past.  Will discontinue escitalopram  and start patient on sertraline  50 mg 1-1 transition.      Relevant Medications   sertraline  (ZOLOFT ) 50  MG tablet   Preventative health care   Discussed age-appropriate immunizations and screening exams.  Did review patient's personal, surgical, social, family histories.  Patient is up-to-date with all age-appropriate vaccinations she would like.  Update flu vaccine today.  Patient is overdue for CRC screening.  Ambulatory order placed today.  Patient is due for breast cancer screening and bone density scan screening.  Orders placed today.  Patient was given information at discharge about preventative healthcare maintenance with anticipatory guidance.      Relevant Orders   CBC with Differential/Platelet   Comprehensive metabolic panel with GFR   TSH   Other Visit Diagnoses       Need for influenza vaccination       Relevant Orders   Flu vaccine HIGH DOSE PF(Fluzone Trivalent) (Completed)     Screening mammogram for breast cancer       Relevant Orders   MM 3D SCREENING MAMMOGRAM BILATERAL BREAST     Screening for osteoporosis       Relevant Orders   DG Bone Density     Postmenopausal       Relevant Orders   DG Bone Density       Return in about 6 weeks (around 05/10/2024) for MDD.    Adina Crandall, NP

## 2024-03-29 NOTE — Assessment & Plan Note (Signed)
 Followed by pulmonology maintained on continuous oxygen 

## 2024-03-29 NOTE — Assessment & Plan Note (Addendum)
 Discontinue escitalopram  and start sertraline  50 mg daily

## 2024-03-29 NOTE — Patient Instructions (Signed)
 Nice to see you today  I have sent in the Zolfot (sertraline ). It is a 1 to 1 transitions Follow up with me virtually in 6 week Follow up with me in 6 months in person

## 2024-03-29 NOTE — Assessment & Plan Note (Signed)
 Patient currently on continuous oxygen  followed by pulmonology maintained on Breztri  Roflumilast  and albuterol  as needed.  Continue taking medication as prescribed follow-up with specialist as recommended

## 2024-03-29 NOTE — Assessment & Plan Note (Signed)
 Pending urine microscopy rule out microscopic hematuria

## 2024-03-29 NOTE — Assessment & Plan Note (Signed)
 Currently maintained on atorvastatin  20 mg pending lipid panel

## 2024-03-29 NOTE — Assessment & Plan Note (Signed)
 History of the same followed by vascular surgery has underwent 2 procedures currently maintained on aspirin  and clopidogrel .  Continue taking medication as prescribed follow-up with specialist as recommended

## 2024-03-29 NOTE — Assessment & Plan Note (Signed)
 History of same followed by cardiology.  Currently maintained on atorvastatin  20 mg daily

## 2024-04-01 ENCOUNTER — Ambulatory Visit: Payer: Self-pay | Admitting: Nurse Practitioner

## 2024-04-01 DIAGNOSIS — M8580 Other specified disorders of bone density and structure, unspecified site: Secondary | ICD-10-CM

## 2024-04-08 ENCOUNTER — Encounter: Payer: Self-pay | Admitting: Acute Care

## 2024-04-10 ENCOUNTER — Other Ambulatory Visit (HOSPITAL_COMMUNITY): Payer: Self-pay

## 2024-04-10 ENCOUNTER — Encounter

## 2024-04-10 MED ORDER — STUDY - ARNASA GB43374 (OPEN-LABEL) - ASTEGOLIMAB 238 MG/1.7 ML SQ INJECTION (PI-RAMASWAMY)
476.0000 mg | INJECTION | SUBCUTANEOUS | Status: AC
  Administered 2024-03-27 – 2024-04-10 (×2): 476 mg via SUBCUTANEOUS
  Filled 2024-04-10: qty 3.4

## 2024-04-10 NOTE — Research (Unsigned)
 Title: A phase III Open-Label extension study to evaluate the long-term safety of Astegolimab in patients with Chronic Obstructive Pulmonary disease.   Dose and Duration of Treatment: Astegolimab is presented as a sterile, slightly brown-yellow solution. Each single-use, 2.25 mL pre-filled syringe contains 1.7 mL deliverable volume. Astegolimab drug product is formulated at 140 mg/mL astegolimab with 114 mM succinic acid, 200 mM L-arginine, 10 mM L-methionine, 0.06% (w/v) polysorbate 20, pH 5.7. Placebo for astegolimab is supplied in an identical pre-filled syringe configuration.   Protocol # [id number] Sponsor: [employer] [address], [place]   Protocol Version: v3 dated 15Oct2024 IB: version 9.0 dated April 2024 ICF:  ICFs: Make sure that the subject is re-consented with the updated ICFs Main ICF: [facility] IRB Approved Version 15 Jun 2023, Revised 28Nov2024 Mobile Nursing: [employer] IRB Approved Version 26Oct2023, Revised 08Aug2024 Optional Home Study Treatment: IRB Approved Version 12 May 2022 Revised 23 Feb 2023 ADDENDUM 1: IRB Approved Version 24Jul2024 Revised 8Aug2024 Lab Manual: V2.1.0 dated 21Jun2024   Mechanism of action: Astegolimab (also known as [id number] or [id number]) is a fully human, IgG2 monoclonal antibody that binds with high affinity to the interleukin (IL)-33 (IL-33) receptor, ST2, thereby blocking the signaling of IL-33, an inflammatory cytokine of the IL-1 family and member of the alarmin class of molecules. Astegolimab binds with high affinity to the human and cynomolgus monkey receptor for IL-33, ST2, and blocks IL-33 binding, thus inhibiting association with the IL-1R accessory protein (AcP) co-receptor and formation of an activated receptor complex.   Key Inclusion Criteria: Able and willing to provide written informed consent and to comply with the study protocol. Ability to comply with the requirements of study protocol, according  to the investigators's best judgement. Completion of the 52-week treatment period in either [place] or Study HA55667.   Key Exclusion Criteria: Significant non- compliance in the parent study, specifically defined as missing scheduled visits, per investigators judgement. Any new clinically significant pulmonary disease other than COPD since enrolling in the study. Unstable cardiac disease, myocardial infarction, or [place]  Heart Association Class III or IV heart failure since enrolling in the parent study.   Integrated Pharmacokinetic/Pharmacodynamic Analysis:  In Studies HA60757, [id number], and [id number], exploratory biomarker analysis showed there were consistent decreases in blood eosinophil counts throughout the treatment period, potentially mediated by a direct effect of IL-33 on eosinophil progenitors. In [place], there was no significant difference in fractional exhaled nitric oxide (FeNO) levels (reflecting airway IL-4/IL-13 activity) between astegolimab-treated groups relative to placebo throughout the treatment period. These data suggest that astegolimab has only a limited effect on Type 2 inflammation in asthma. No data are applicable for Study [id number]. No data are available yet for Study HA56688.   Special Warnings/Considerations: Administration of astegolimab, a protein therapeutic, may lead to the development of anti-astegolimab antibodies which could lead to AEs and/or decreased exposure. In non-clinical studies (see Section 4.2), ADA incidence has generally been low and there was no apparent ADA impact on PK and safety in these studies. To date, the immunogenicity rates observed with astegolimab in clinical studies have been relatively low as well (see Immunogenicity Section 5.6). Several clinical studies have been conducted in patients with asthma, atopic dermatitis, COPD (IIS Study HA59431) and COVID-19 severe pneumonia, which generally showed low incidence of ADAs (Table 31).  There has been no correlation between ADA status and clinical findings or increased incidence of AEs. Astegolimab is now being considered in a larger study for the treatment of  COPD. This patient population is typically considered to have a hyper-responsive immune system. Route of administration for this molecule will be [place], either Q2W or Q4W. These factors increase the risk of development of an immune response to astegolimab, specifically with repeat dosing. From the previous IIS Study HA59431, incidence of ADAs was low in COPD patients; this remains to be confirmed in a larger study. To monitor ADA development in ongoing studies, serum samples will be collected from patients at protocol-defined intervals. Patients who test positive for antibodies and have clinical sequelae that are considered potentially related to an ADA response may also be asked to return for additional follow-up testing.   Drug Interaction Studies: No PK drug interaction studies have been conducted to date.   Serious Adverse Reactions Observed in Asthma Study: During the treatment period, a similar proportion of patients across all cohorts experienced at least one AE, regardless of causality (Table 22). In total, 77.2%, 70.9%, 72.2%, and 72.1% of patients reported at least 1 AE in the placebo, 70 mg, 210 mg, and 490 mg groups, respectively. The most common AE's (>5% in any treatment group) were asthma, nasopharyngitis, upper respiratory tract infection, headache, and injection site reaction (ISR). The most common drug-related AE was ISR, which was reported more frequently in the astegolimab treatment groups than in the placebo group (1 [0.8%] patient in the placebo group, 10 [7.9%] patients with 70 mg, 8 [6.3%] patients with 210 mg, 6 [4.9%] patients with 490 mg). All ISRs were non-serious and mild or moderate in severity. During the treatment period, 50 SAEs were reported in 37 (7.4%) patients. The number of patients reporting SAEs was  comparable across all cohorts (11 SAEs in 8 patients on placebo, 21 SAEs in 14 patients on 70 mg, 11 SAEs in 9 patients on 210 mg, and 7 SAEs in 6 patients on 490 mg). The most common SAE was asthma. One SAE of moderate livedo reticularis (70 mg) was considered a suspected unexpected serious adverse drug reaction (SUSAR) related to astegolimab and was reported two days after the second dose, leading to discontinuation of astegolimab. Two (0.4%) patients reported anaphylaxis and hypersensitivity reactions: 1 severe SAE of anaphylactic reaction (placebo), and 1 moderate hypersensitivity (490 mg) considered related to astegolimab. Three (0.6%) patients experienced a potential Major Adverse Cardiac Event (MACE) (1 patient each in the placebo, 70 mg, and 210 mg groups). None of the potential MACE were considered related to astegolimab. Overall, 233 (46.4%) patients reported events of infection. A comparable number of patients reported infection across all treatment groups (65 [51.2%] patients on placebo, 55 [43.3%] patients on 70 mg, 58 [46.0%] patients on 210 mg, and 55 [45.1%] patients on 490 mg). The most frequently reported infection (>=10% incidence) was nasopharyngitis (12.7%). One patient on astegolimab 210 mg and the partner of one patient on placebo became pregnant during the study. Both delivered normal/healthy babies. Two deaths, unrelated to study drug, were reported: one patient on 210 mg astegolimab died following an SAE of asthma; the other patient on 490 mg astegolimab had an unexplained death. There were no clinically meaningful changes in laboratory parameters, vital signs, or ECG results, other than the 10% decrease in mean blood eosinophil counts in the astegolimab-treated groups, with no safety concerns. Treatment-induced ADAs were comparable between the astegolimab groups and had no impact on safety. Overall, astegolimab was well tolerated at all doses used and had a safety profile consistent with  that observed in the previous astegolimab Phase I studies.   Serious  Adverse Reactions Observed in Previous COPD Study: In the completed IIS Study HA59431, a total of 81 patients received at least one dose of astegolimab or placebo. The safety profile of astegolimab was similar to that of placebo. There were a total of 222 AEs reported in 62 patients. A total of 28 (72%) patients in the placebo arm and 34 (81%) patients in the astegolimab arm reported at least one AE. The most commonly reported AEs were headache followed by urinary tract infection and viral upper respiratory tract infection. A total of 39 SAEs were reported in 28 (35%) patients; 16 (41%) patients in the placebo group and 12 (29%) in the astegolimab group. The most commonly reported SAE was hospital admission for community acquired pneumonia. Four SAEs resulted in patient discontinuation from treatment (1 patient in the placebo group and 3 patients in the astegolimab group). One patient in the placebo group experienced an AESI of potential MACE (heart failure, unrelated to the study treatment). No anaphylaxis or pregnancies were reported. Two deaths, unrelated to study treatment, were reported: 1 patient in the placebo group died after hospital acquired pneumonia, and another patient in the placebo group died after pneumonia and type 2 respiratory failure.   Safety Data: Astegolimab has been generally well tolerated. There have been 49 patient deaths across all astegolimab studies (Section 5.5.2), none of which were considered related to astegolimab. A total of 144 subjects experienced a total of 224 SAEs across all astegolimab studies. Of these, an SAE livedo reticularis observed in 1 patient was considered related to astegolimab by the investigator (Section 5.5.3). AEs leading to withdrawal (Section 5.5.4) have generally occurred at a rate similar to what would be expected for clinical trials in the studies' respective indications.   [facility]  @ [place] Clinical Research Coordinator note:    This visit for Subject: [id number] DOB: [birth year] on 24Sep2025 for the above protocol is Visit/Encounter # week 22 and is for purpose of research.    Subject expressed continued interest and consent in continuing as a study subject. Subject confirmed that there was no change in contact information (e.g. address, telephone, email). Subject thanked for participation in research and contribution to science. The Subject was informed that the PI Dr. Geronimo continues to have oversight of the subject's visits and course through relevant discussions, reviews and specifically of this visit by routing of this note to the PI.  All assessments and procedures were performed per above stated protocol. Subject tolerated injections well. Please refer to subject's paper source binder for further details of this visit.   Signed by Octaviano Paras, CCRP, COT Clinical Research Coordinator  [facility]  [place], [place]

## 2024-04-11 ENCOUNTER — Ambulatory Visit: Payer: Self-pay | Admitting: Physician Assistant

## 2024-04-11 ENCOUNTER — Ambulatory Visit: Admitting: Physician Assistant

## 2024-04-11 VITALS — BP 162/74 | HR 79

## 2024-04-11 DIAGNOSIS — N3946 Mixed incontinence: Secondary | ICD-10-CM | POA: Diagnosis not present

## 2024-04-11 DIAGNOSIS — R3129 Other microscopic hematuria: Secondary | ICD-10-CM

## 2024-04-11 LAB — URINALYSIS, COMPLETE
Bilirubin, UA: NEGATIVE
Glucose, UA: NEGATIVE
Ketones, UA: NEGATIVE
Leukocytes,UA: NEGATIVE
Nitrite, UA: POSITIVE — AB
Protein,UA: NEGATIVE
Specific Gravity, UA: 1.01 (ref 1.005–1.030)
Urobilinogen, Ur: 0.2 mg/dL (ref 0.2–1.0)
pH, UA: 6 (ref 5.0–7.5)

## 2024-04-11 LAB — MICROSCOPIC EXAMINATION

## 2024-04-11 NOTE — Progress Notes (Signed)
 In and Out Catheterization  Patient is present today for a I & O catheterization due to Cath UA needed. Patient was cleaned and prepped in a sterile fashion with betadine . A 14FR cath was inserted no complications were noted , of urine return was noted, urine was yellow in color. A clean urine sample was collected for Urinalysis. Bladder was drained  And catheter was removed with out difficulty.    Performed by: Beauford Browner, CCMA  Follow up/ Additional notes: 1 month follow-up

## 2024-04-11 NOTE — Progress Notes (Signed)
 PTNS  Session # 21  Health & Social Factors: no change Caffeine: 2 Alcohol: 0 Daytime voids #per day: 3 Night-time voids #per night: 2-3 Urgency: strong Incontinence Episodes #per day: 3 Ankle used: right Treatment Setting: 6 Feeling/ Response: both Comments: Patient tolerated well  Performed By: Lucie Hones, PA-C   Follow Up: 1 month

## 2024-04-14 ENCOUNTER — Other Ambulatory Visit: Payer: Self-pay | Admitting: Internal Medicine

## 2024-04-17 ENCOUNTER — Other Ambulatory Visit: Payer: Self-pay

## 2024-04-19 ENCOUNTER — Other Ambulatory Visit: Payer: Self-pay

## 2024-04-22 ENCOUNTER — Ambulatory Visit
Admission: RE | Admit: 2024-04-22 | Discharge: 2024-04-22 | Disposition: A | Discharge status: Home / Self Care | Source: Ambulatory Visit | Attending: Acute Care | Admitting: Acute Care

## 2024-04-22 DIAGNOSIS — Z87891 Personal history of nicotine dependence: Secondary | ICD-10-CM | POA: Diagnosis not present

## 2024-04-22 DIAGNOSIS — Z122 Encounter for screening for malignant neoplasm of respiratory organs: Secondary | ICD-10-CM | POA: Insufficient documentation

## 2024-04-24 ENCOUNTER — Encounter: Admitting: Internal Medicine

## 2024-04-24 ENCOUNTER — Other Ambulatory Visit: Payer: Self-pay

## 2024-04-24 ENCOUNTER — Telehealth: Payer: Self-pay | Admitting: Acute Care

## 2024-04-24 DIAGNOSIS — Z122 Encounter for screening for malignant neoplasm of respiratory organs: Secondary | ICD-10-CM

## 2024-04-24 DIAGNOSIS — Z87891 Personal history of nicotine dependence: Secondary | ICD-10-CM

## 2024-04-24 DIAGNOSIS — R911 Solitary pulmonary nodule: Secondary | ICD-10-CM

## 2024-04-24 DIAGNOSIS — Z006 Encounter for examination for normal comparison and control in clinical research program: Secondary | ICD-10-CM

## 2024-04-24 MED ORDER — STUDY - ARNASA GB43374 (OPEN-LABEL) - ASTEGOLIMAB 238 MG/1.7 ML SQ INJECTION (PI-RAMASWAMY)
476.0000 mg | INJECTION | SUBCUTANEOUS | Status: AC
  Administered 2024-04-24: 476 mg via SUBCUTANEOUS
  Filled 2024-04-24: qty 3.4

## 2024-04-24 NOTE — Telephone Encounter (Signed)
 Please order PET scan and follow up in office with me 1-2 weeks after the PET scan.

## 2024-04-24 NOTE — Telephone Encounter (Signed)
 Received call report on pt's LDCT, impression below:  IMPRESSION: 1. New 11.6 mm posterior left lower lobe nodule. Lung-RADS 4B, suspicious. Additional imaging evaluation or consultation with Pulmonology or Thoracic Surgery recommended. These results will be called to the ordering clinician or representative by the Radiologist Assistant, and communication documented in the PACS or [employer]. 2. Aortic atherosclerosis (ICD10-I70.0). Coronary artery calcification. 3.  Emphysema (ICD10-J43.9).     Electronically Signed   By: Newell Eke M.D.   On: 04/24/2024 10:50

## 2024-04-24 NOTE — Telephone Encounter (Signed)
 Spoke with patient and reviewed recent lung CT results. She is in agreement to complete a PET scan to evaluate the new 11.6 mm nodule. Order placed. She will follow up in office 1-2 weeks after scan to review results. Results and plan to PCP.

## 2024-04-25 NOTE — Research (Signed)
 Title: A phase III Open-Label extension study to evaluate the long-term safety of Astegolimab in patients with Chronic Obstructive Pulmonary disease.   Dose and Duration of Treatment: Astegolimab is presented as a sterile, slightly brown-yellow solution. Each single-use, 2.25 mL pre-filled syringe contains 1.7 mL deliverable volume. Astegolimab drug product is formulated at 140 mg/mL astegolimab with 114 mM succinic acid, 200 mM L-arginine, 10 mM L-methionine, 0.06% (w/v) polysorbate 20, pH 5.7. Placebo for astegolimab is supplied in an identical pre-filled syringe configuration.   Protocol # [id number] Sponsor: [employer] [address], [place]   Protocol Version: v3 dated 15Oct2024 IB: version 9.0 dated April 2024 ICF:  ICFs: Make sure that the subject is re-consented with the updated ICFs Main ICF: [facility] IRB Approved Version 15 Jun 2023, Revised 28Nov2024 Mobile Nursing: [employer] IRB Approved Version 26Oct2023, Revised 08Aug2024 Optional Home Study Treatment: IRB Approved Version 12 May 2022 Revised 23 Feb 2023 ADDENDUM 1: IRB Approved Version 24Jul2024 Revised 8Aug2024 Lab Manual: V2.1.0 dated 21Jun2024   Mechanism of action: Astegolimab (also known as [id number] or [id number]) is a fully human, IgG2 monoclonal antibody that binds with high affinity to the interleukin (IL)-33 (IL-33) receptor, ST2, thereby blocking the signaling of IL-33, an inflammatory cytokine of the IL-1 family and member of the alarmin class of molecules. Astegolimab binds with high affinity to the human and cynomolgus monkey receptor for IL-33, ST2, and blocks IL-33 binding, thus inhibiting association with the IL-1R accessory protein (AcP) co-receptor and formation of an activated receptor complex.   Key Inclusion Criteria: Able and willing to provide written informed consent and to comply with the study protocol. Ability to comply with the requirements of study protocol, according  to the investigators's best judgement. Completion of the 52-week treatment period in either [place] or Study HA55667.   Key Exclusion Criteria: Significant non- compliance in the parent study, specifically defined as missing scheduled visits, per investigators judgement. Any new clinically significant pulmonary disease other than COPD since enrolling in the study. Unstable cardiac disease, myocardial infarction, or [place]  Heart Association Class III or IV heart failure since enrolling in the parent study.   Integrated Pharmacokinetic/Pharmacodynamic Analysis:  In Studies HA60757, [id number], and [id number], exploratory biomarker analysis showed there were consistent decreases in blood eosinophil counts throughout the treatment period, potentially mediated by a direct effect of IL-33 on eosinophil progenitors. In [place], there was no significant difference in fractional exhaled nitric oxide (FeNO) levels (reflecting airway IL-4/IL-13 activity) between astegolimab-treated groups relative to placebo throughout the treatment period. These data suggest that astegolimab has only a limited effect on Type 2 inflammation in asthma. No data are applicable for Study [id number]. No data are available yet for Study HA56688.   Special Warnings/Considerations: Administration of astegolimab, a protein therapeutic, may lead to the development of anti-astegolimab antibodies which could lead to AEs and/or decreased exposure. In non-clinical studies (see Section 4.2), ADA incidence has generally been low and there was no apparent ADA impact on PK and safety in these studies. To date, the immunogenicity rates observed with astegolimab in clinical studies have been relatively low as well (see Immunogenicity Section 5.6). Several clinical studies have been conducted in patients with asthma, atopic dermatitis, COPD (IIS Study HA59431) and COVID-19 severe pneumonia, which generally showed low incidence of ADAs (Table 31).  There has been no correlation between ADA status and clinical findings or increased incidence of AEs. Astegolimab is now being considered in a larger study for the treatment of  COPD. This patient population is typically considered to have a hyper-responsive immune system. Route of administration for this molecule will be [place], either Q2W or Q4W. These factors increase the risk of development of an immune response to astegolimab, specifically with repeat dosing. From the previous IIS Study HA59431, incidence of ADAs was low in COPD patients; this remains to be confirmed in a larger study. To monitor ADA development in ongoing studies, serum samples will be collected from patients at protocol-defined intervals. Patients who test positive for antibodies and have clinical sequelae that are considered potentially related to an ADA response may also be asked to return for additional follow-up testing.   Drug Interaction Studies: No PK drug interaction studies have been conducted to date.   Serious Adverse Reactions Observed in Asthma Study: During the treatment period, a similar proportion of patients across all cohorts experienced at least one AE, regardless of causality (Table 22). In total, 77.2%, 70.9%, 72.2%, and 72.1% of patients reported at least 1 AE in the placebo, 70 mg, 210 mg, and 490 mg groups, respectively. The most common AE's (>5% in any treatment group) were asthma, nasopharyngitis, upper respiratory tract infection, headache, and injection site reaction (ISR). The most common drug-related AE was ISR, which was reported more frequently in the astegolimab treatment groups than in the placebo group (1 [0.8%] patient in the placebo group, 10 [7.9%] patients with 70 mg, 8 [6.3%] patients with 210 mg, 6 [4.9%] patients with 490 mg). All ISRs were non-serious and mild or moderate in severity. During the treatment period, 50 SAEs were reported in 37 (7.4%) patients. The number of patients reporting SAEs was  comparable across all cohorts (11 SAEs in 8 patients on placebo, 21 SAEs in 14 patients on 70 mg, 11 SAEs in 9 patients on 210 mg, and 7 SAEs in 6 patients on 490 mg). The most common SAE was asthma. One SAE of moderate livedo reticularis (70 mg) was considered a suspected unexpected serious adverse drug reaction (SUSAR) related to astegolimab and was reported two days after the second dose, leading to discontinuation of astegolimab. Two (0.4%) patients reported anaphylaxis and hypersensitivity reactions: 1 severe SAE of anaphylactic reaction (placebo), and 1 moderate hypersensitivity (490 mg) considered related to astegolimab. Three (0.6%) patients experienced a potential Major Adverse Cardiac Event (MACE) (1 patient each in the placebo, 70 mg, and 210 mg groups). None of the potential MACE were considered related to astegolimab. Overall, 233 (46.4%) patients reported events of infection. A comparable number of patients reported infection across all treatment groups (65 [51.2%] patients on placebo, 55 [43.3%] patients on 70 mg, 58 [46.0%] patients on 210 mg, and 55 [45.1%] patients on 490 mg). The most frequently reported infection (>=10% incidence) was nasopharyngitis (12.7%). One patient on astegolimab 210 mg and the partner of one patient on placebo became pregnant during the study. Both delivered normal/healthy babies. Two deaths, unrelated to study drug, were reported: one patient on 210 mg astegolimab died following an SAE of asthma; the other patient on 490 mg astegolimab had an unexplained death. There were no clinically meaningful changes in laboratory parameters, vital signs, or ECG results, other than the 10% decrease in mean blood eosinophil counts in the astegolimab-treated groups, with no safety concerns. Treatment-induced ADAs were comparable between the astegolimab groups and had no impact on safety. Overall, astegolimab was well tolerated at all doses used and had a safety profile consistent with  that observed in the previous astegolimab Phase I studies.   Serious  Adverse Reactions Observed in Previous COPD Study: In the completed IIS Study HA59431, a total of 81 patients received at least one dose of astegolimab or placebo. The safety profile of astegolimab was similar to that of placebo. There were a total of 222 AEs reported in 62 patients. A total of 28 (72%) patients in the placebo arm and 34 (81%) patients in the astegolimab arm reported at least one AE. The most commonly reported AEs were headache followed by urinary tract infection and viral upper respiratory tract infection. A total of 39 SAEs were reported in 28 (35%) patients; 16 (41%) patients in the placebo group and 12 (29%) in the astegolimab group. The most commonly reported SAE was hospital admission for community acquired pneumonia. Four SAEs resulted in patient discontinuation from treatment (1 patient in the placebo group and 3 patients in the astegolimab group). One patient in the placebo group experienced an AESI of potential MACE (heart failure, unrelated to the study treatment). No anaphylaxis or pregnancies were reported. Two deaths, unrelated to study treatment, were reported: 1 patient in the placebo group died after hospital acquired pneumonia, and another patient in the placebo group died after pneumonia and type 2 respiratory failure.   Safety Data: Astegolimab has been generally well tolerated. There have been 49 patient deaths across all astegolimab studies (Section 5.5.2), none of which were considered related to astegolimab. A total of 144 subjects experienced a total of 224 SAEs across all astegolimab studies. Of these, an SAE livedo reticularis observed in 1 patient was considered related to astegolimab by the investigator (Section 5.5.3). AEs leading to withdrawal (Section 5.5.4) have generally occurred at a rate similar to what would be expected for clinical trials in the studies' respective indications.   [facility]  @ [place] Clinical Research Coordinator note:    This visit for Subject: [id number] DOB: 1955-09-12 on 08Sep2025 for the above protocol is Visit/Encounter # week 24 and is for purpose of research.    Subject expressed continued interest and consent in continuing as a study subject. Subject confirmed that there was no change in contact information (e.g. address, telephone, email). Subject thanked for participation in research and contribution to science. The Subject was informed that the PI Dr. Geronimo continues to have oversight of the subject's visits and course through relevant discussions, reviews and specifically of this visit by routing of this note to the PI.  All assessments and procedures were performed per above stated protocol. Subject tolerated injections well. Please refer to subject's paper source binder for further details of this visit.   Signed by Octaviano Paras, CCRP, COT Clinical Research Coordinator  [facility]  [place], [place]

## 2024-04-29 ENCOUNTER — Other Ambulatory Visit: Payer: Self-pay | Admitting: Nurse Practitioner

## 2024-04-29 DIAGNOSIS — F419 Anxiety disorder, unspecified: Secondary | ICD-10-CM

## 2024-04-29 DIAGNOSIS — F320 Major depressive disorder, single episode, mild: Secondary | ICD-10-CM

## 2024-04-29 NOTE — Progress Notes (Signed)
 Maria G.  [name], DOB 12-08-1955, was seen as subject in a clinical trial /Protocol # [id number]. She describes a small uproar in my lungs 6 weeks ago with wheezing & dyspnea for which she received antibiotics and prednisone  on 2 occasions from Dr. Geronimo.  She also described itchy,watery eyes.  She denied any associated fever, nasal purulence, or yellow sputum . After the wheezing resolved she has had a residual nonproductive cough, worse when supine.  Because of that she has raised the head of her bed .  She does have ongoing postnasal drainage.  Tessalon  Perles as needed have provided only partial response.  She is also using Robitussin.  She is using a nasal spray which she believes is a steroid. Pertinent physical findings : she is wearing nasal oxygen ; there is erythema of the nares and septum.Clear rhinitis is present. Complete dentures worn. Heart sounds are distant and rhythm is irregular.  Breath sounds are markedly decreased.  Slight clubbing of nailbeds is noted.  She has ecchymoses of the dorsum of the right hand and 2 areas of the left forearm.  She has scattered vitiliginous scarring of the forearms. All physical findings NCS. She has a follow-up appointment with Dr. Geronimo on October 21.                                                                     Maria JULIANNA Roses MD,SI

## 2024-05-03 ENCOUNTER — Ambulatory Visit
Admission: RE | Admit: 2024-05-03 | Discharge: 2024-05-03 | Disposition: A | Discharge status: Home / Self Care | Source: Ambulatory Visit | Attending: Acute Care | Admitting: Acute Care

## 2024-05-03 ENCOUNTER — Other Ambulatory Visit: Payer: Self-pay | Admitting: Cardiology

## 2024-05-03 ENCOUNTER — Other Ambulatory Visit: Payer: Self-pay

## 2024-05-03 DIAGNOSIS — I700 Atherosclerosis of aorta: Secondary | ICD-10-CM | POA: Insufficient documentation

## 2024-05-03 DIAGNOSIS — Z87891 Personal history of nicotine dependence: Secondary | ICD-10-CM | POA: Diagnosis not present

## 2024-05-03 DIAGNOSIS — J439 Emphysema, unspecified: Secondary | ICD-10-CM | POA: Insufficient documentation

## 2024-05-03 DIAGNOSIS — I2089 Other forms of angina pectoris: Secondary | ICD-10-CM

## 2024-05-03 DIAGNOSIS — R911 Solitary pulmonary nodule: Secondary | ICD-10-CM | POA: Diagnosis not present

## 2024-05-03 DIAGNOSIS — Z122 Encounter for screening for malignant neoplasm of respiratory organs: Secondary | ICD-10-CM | POA: Diagnosis not present

## 2024-05-03 DIAGNOSIS — I2510 Atherosclerotic heart disease of native coronary artery without angina pectoris: Secondary | ICD-10-CM | POA: Insufficient documentation

## 2024-05-03 DIAGNOSIS — R918 Other nonspecific abnormal finding of lung field: Secondary | ICD-10-CM | POA: Diagnosis not present

## 2024-05-03 LAB — GLUCOSE, CAPILLARY: Glucose-Capillary: 91 mg/dL (ref 70–99)

## 2024-05-03 MED ORDER — FLUDEOXYGLUCOSE F - 18 (FDG) INJECTION
8.8000 | Freq: Once | INTRAVENOUS | Status: AC | PRN
  Administered 2024-05-03: 8.75 via INTRAVENOUS

## 2024-05-06 NOTE — Progress Notes (Unsigned)
 Subjective:  Patient ID: Maria [name], female , DOB: 1956-05-05 , age 68 y.o. , MRN: [id number] , ADDRESS: [address] [place] [address]   03/19/2019 -   Chief Complaint  Patient presents with   Hospitalization Follow-up    Post covid symptoms   Post COVID  follow-up in the pulmonary office  HPI Maria [name] 68 y.o. 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  And also poor intake.  Was diagnosed to have acute COVID-19.  She remembers being on oxygen .  At the time prior to admission she is had chronic hip pain based on her history and review of the records for which she was using a cane.  She spent a total of 5 days at [facility] the COVID hospital and was discharged on March 03, 2019.  She was treated with oxygen , Solu-Medrol  and REM does Advair.  Last set of laboratories reviewed on March 03, 2019 which showed that she had normal hemoglobin and creatinine and his CRP had improved.  The only chest x-ray that was available is 1 on admission in February 26, 2019 but she has some basal infiltrates.  She now presents to the pulmonary clinic on March 19, 2019.  It is 21 days since admission and she is over 21 days since her first set of symptoms.  She tells me that she is a lot more fatigue.  She is so fatigued that she is not even able to take her inhaler properly which is Trelegy for her COPD.  She feels short of breath all the time.  She is requiring a lot of help taking a shower.  Her hip pain continues.  She is now needing the use of a walker.  Her ECOG is 4.  Her appetite is good.  She is currently not using oxygen  either at night or in the daytime.   Results for Risby, Taja G CINDY (MRN [id number]) as of  08/22/2019 12:57  Ref. Range 03/01/2019 03:22 03/02/2019 01:55 03/03/2019 01:54  D-Dimer, Earleen Latest Ref Range: 0.00 - 0.50 ug/mL-FEU 1.34 (H) 1.12 (H) 1.28 (H)       04/03/2019 Presents today for a 2-week follow-up.  Echocardiogram showed normal systolic function with estimated EF 60 to 65%.  Left ventricle diastolic doppler consistent with impaired relaxation-determinant filling pressures.  Right ventricle with normal systolic function, increased wall thickness-systolic pressure could not be assessed.  HRCT is scheduled for 9/29.  Patient called on 9/4 asking about portable oxygen  tank order.  Reports that her O2 level has read 84 to 85% after exertion with a heart rate of 110.  She reports that her oxygen  level recovers greater than 90% after resting.  Is not currently on oxygen  at home will need to be walked in office to qualify for POC. Repeat COVID testing was positive, patient reports that she still has lost her taste and smell with increased shortness of breath.   She is feeling a little better today. Family is checking on her. No other help at home. Reports feeling fine when sitting but her O2 drops as low as 85% on exertion. She is able to recover with rest. Taking prednisone  taper as prescribed, feels it has helped her chest tightness. She has  dry/hacking cough with no mucus production. She is eating and drinking ok. Denies fever.   04/09/2019 Patient presents today for 1 week follow-up. Unfortunately we were unable to get her set up with home health d/t her insurance. She is here today for qualifying O2 walk. She continues to have some shortness of breath and dry cough. Low energy. Her taste and smell as returned some. She is drinking plenty of fluids. Reports that her cough is not quite as bad, states the more she talks she has to cough. Has trouble sleeping at night d/t her respiratory symptoms. Feels delsym and tessalon  perles have been helpful. ONO on RA showed O2 low 70% with baseline 87%.  Ambulatory O2 today showed O2 desaturation 84% on RA, requiring 3L. Using [employer] for nebulizer supplies. Denies fever, weight loss, hemoptysis, purulent mucus.   04/22/2019 Patient presents today for 2 week follow-up. Breathing is the same. Continues to have a dry cough. Completed prednisone  2 days ago. Using incentive spirometry. She has tried to go without her oxygen  at home but states that she gets too short winded. 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 Diffuse bronchial wall thickening with moderate centrilobular and paraseptal emphysema; imaging findings suggestive of underlying COPD. She had COPD symptoms before getting COVID. Her breathing was getting a little worse before her diagnosis.  Sleeping better at night with oxygen . Ambulating with rolling walker, still deconditioned/weak. Due for influenza vaccine today.   Oxygen  readings:  Resting O2 93% on RA  Ambulatory O2 98-100 on 3L Ambulatory O2 85% on RA  OV 07/03/2019  Subjective:  Patient ID: Maria [name], female , DOB: [birth year] , age 68 y.o. , MRN: [id number] , ADDRESS: [address] [place] [address]   07/03/2019 -   Chief Complaint  Patient presents with   Follow-up    PFT performed 12/7 and pt is here following up after that. Pt states she is still becoming SOB with exertion.  Follow-up emphysema Follow-up interstitial lung disease post COVID-19 in August 2020 (had elevatd d-dimer) Follow-up chronic hypoxemic respiratory failure secondary to both   HPI Maria [name] 68 y.o. -presents for the above issues.  She says her condition is returned to normal.  She is back working as a Equities trader.  However she continues to be still significantly symptomatic.  She had a pulmonary function test June 24, 2019.  I visualized the result.  Shows isolated reduction in diffusion capacity  was only moderate at 57%.  FVC and FEV1 are normal.  She continues to be significantly symptomatic as documented below.  She is taking Trelegy.  She says she desaturates to 85% with exertion echocardiogram recently was normal.  She is asking if she should take the Moderna COVID-19 vaccine available in January 2021         IMPRESSION: CT chest 04/16/2019 1. 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 Given the presence of some bronchiectasis, this is technically characterized as probable UIP per current ATS guidelines, however, that is not favored. If there is persistent clinical concern for interstitial lung disease, repeat high-resolution chest CT would be recommended in 12 months to assess for temporal changes in the appearance of the lung parenchyma. 2. Diffuse bronchial wall thickening with moderate centrilobular and  paraseptal emphysema; imaging findings suggestive of underlying COPD. 3. Aortic atherosclerosis, in addition to right coronary artery disease. Please note that although the presence of coronary artery calcium  documents the presence of coronary artery disease, the severity of this disease and any potential stenosis cannot be assessed on this non-gated CT examination. Assessment for potential risk factor modification, dietary therapy or pharmacologic therapy may be warranted, if clinically indicated.   Aortic Atherosclerosis (ICD10-I70.0) and Emphysema (ICD10-J43.9).     Electronically Signed   By: Toribio Aye M.D.   On: 04/16/2019 13:12   OV 08/22/2019  Subjective:  Patient ID: Maria [name], female , DOB: 18-Apr-1956 , age 21 y.o. , MRN: [id number] , ADDRESS: [address] [place] [address]   08/22/2019 -   Chief Complaint  Patient presents with   Follow-up    History of 2019 Novel coronavirus disease (COVID-19)    Follow-up emphysema - alpha 1 MZ in dec 2020 Follow-up interstitial lung disease post COVID-19 in August 2020 Follow-up chronic hypoxemic respiratory failure secondary to both Ex smoker Echo sept 202 0 - ef 65% and diast dysfn  HPI Maria [name] 68 y.o. -presents for follow-up of the above issues.  In the interim she did have serologic work-up for her ILD that was discovered post Covid setting.  Her ANA and rheumatoid factor were trace positive.  Her ESR was 41.  Therefore we committed her to few to several week prednisone  taper.  She says she is on a 5-week taper.  She is got 2 more weeks of this.  She says since her last visit she is feeling worse.  The worsening is in terms of shortness of breath.  Also has dizziness and floaters when she exerts.  In fact on a virtual rehab class she had to get on more oxygen .  Her dyspnea symptom scores are worse than before but cough and fatigue around the same.  She is requiring 3 L now with exertion.  Although in rehab the asked her to increase it to 6 L which helped but this was not documented because of worsening hypoxemia.  Today when we walked her she had to stop several times because of dizziness and fatigue although she did not desaturate.  Her lung function itself in December 2020 shows only isolated reduction in DLCO.   Of note she is alpha-1 MZ from her labs at last visit.    OV 04/29/2020   Subjective:  Patient ID: Maria [name], female , DOB: 12-27-1955, age 17 y.o. years. , MRN: [id number],  ADDRESS: [address] [place] [address] PCP  Velma Harlene SAUNDERS, MD Providers : Treatment Team:  Attending Provider: Geronimo Amel, MD Patient Care Team: Velma Harlene SAUNDERS, MD as PCP - General (Family Medicine)   Follow-up emphysema - alpha 1 MZ in dec 2020 Follow-up interstitial lung disease post COVID-19 in August 2020 -> feb 2021 Case conf ILD: NO ILD Follow-up chronic hypoxemic respiratory failure secondary to both Ex smoker Echo sept  202 0 - ef 65% and diast dysfn Last CT scan of the chest February 2021  Chief Complaint  Patient presents with   Follow-up    Pt states that she has had good days and bad days since last visit. Pt said that she is still having problems with SOB also has an occ cough.       HPI Maria [name] 68 y.o. -returns for follow-up.  Last seen by myself in February 2021.  After that she  is seen nurse practitioner.  Alpha-1 replacement was advised by nurse practitioner.  Initially denied by insurance but now apparently approved by insurance.  The still waiting for some of paperwork to be clear.  Currently on Trelegy.  She is on oxygen  3 L at rest 5 L with exertion.  She is fully vaccinated.  She is in need of Covid booster.  Overall doing well.  No flowsheet data found.        Simple office walk 185 feet x  3 laps goal with forehead probe 08/22/2019   O2 used 3L  Number laps completed Did only 2 of 3. Stopped 8 times and site down x 2. Got dizzy and floaters in eyes  Comments about pace Very slow pace  Resting Pulse Ox/HR 99% and 91/min  Final Pulse Ox/HR 99% during entire walk  Desaturated </= 88% x  Desaturated <= 3% points x  Got Tachycardic >/= 90/min yes  Symptoms at end of test As above  Miscellaneous comments Sub maximal exertion     Las\bs - alph 1 MZ in dec 2020 - autoimmune  - ANA 1:40, RF 23, and ESR 40s   PFT dec 2020  - isolated reduction in dlco 57%   OV 08/27/2020  Subjective:  Patient ID: Maria [name], female , DOB: Mar 03, 1956 , age 29 y.o. , MRN: [id number] , ADDRESS: [address] [place] [address] PCP Velma Harlene SAUNDERS, MD Patient Care Team: Velma Harlene SAUNDERS, MD as PCP - General (Family Medicine)  This Provider for this visit: Treatment Team:  Attending Provider: Geronimo Amel, MD    08/27/2020 -   Chief Complaint  Patient presents with   Follow-up    Pt states she still has problems with SOB. States when the weather is colder her breathing  is worse. Pt also has complaints of cough which happens all throughout the day.    Follow-up emphysema - alpha 1 MZ in dec 2020 Follow-up interstitial lung disease post COVID-19 in August 2020 -> feb 2021 Case conf ILD: NO ILD Follow-up chronic hypoxemic respiratory failure secondary to both Ex smoker 40 pack quit Echo sept 202 0 - ef 65% and diast dysfn Last CT scan of the chest February 2021  HPI Maria [name] 68 y.o. -returns to see me for the above issues.  Since last visit she has had a Covid vaccine booster.  Overall she is stable but for the last few months has had worsening cough.  The cough is dry.  She continues with oxygen  3-5 L.  We will try to get her alpha-1 replacement but she got denied by insurance.  She showed me the insurance denial letters on August 10, 2020.  Apparently there was a hearing which she was asked to participate but they sent the note is late and on the same day they denied her appeal.  She feels this is all premeditated by the insurance company.  She is frustrated.  She continues with Trelegy and oxygen .  Review of the records indicate last CT scan of the chest was in February 2021.  She quit smoking less than 15 years ago.  She she is in need of annual CT scan now.  Other than the dry worsening cough she feels well.  There is no worsening wheezing or sputum.   CT Chest data  No results found.       OV 11/13/2020  Subjective:  Patient ID: Maria [name], female , DOB: [birth year] , age 49 y.o. ,  MRN: [id number] , ADDRESS: [address] [place] [address] PCP Velma Harlene SAUNDERS, MD Patient Care Team: Velma Harlene SAUNDERS, MD as PCP - General (Family Medicine)  This Provider for this visit: Treatment Team:  Attending Provider: Geronimo Amel, MD    11/13/2020 -   Chief Complaint  Patient presents with   Follow-up    PFT performed 4/28.  Pt states her cough is worse since last visit and states she still becomes SOB easily.   Follow-up emphysema -  alpha 1 MZ in dec 2020  -- On Trelegy for few years as of 2022.  Dose increased early 2022  -Insurance denied alpha-1 replacement in 2020 07/2020 Follow-up interstitial lung disease post COVID-19 in August 2020 -> feb 2021 Case conf ILD: NO ILD Follow-up chronic hypoxemic respiratory failure secondary to both Ex smoker 40 pack quit Echo sept 202 0 - ef 65% and diast dysfn Last CT scan of the chest February 2021  Chronic coough since jan 2022  HPI Maria [name] 68 y.o. -returns for work-up.  I last saw her in February 2022.  At that time she was reporting worsening cough.  We decided to full work-up.  She had pulmonary function test that shows stability since 2020 although the DLCO is down [?  Technical issues, last hemoglobin 12.1 g% in 2021 May] her oxygen  use is the same around 3 L at rest 5 L with exertion.  She uses Trelegy.  She is not on any antihypertensives.  Not on any acid reflux medication.  Not on fish oil.  Her smoking is in remission.  The cough wakes her up at night.  Cough severity is documented below.  It significantly high suggesting irritable larynx syndrome/cough neuropathy.  She had CT scan of the chest that only shows emphysema.  No fibrosis or pneumonia or nodules or cancer.  She had an arterial blood gas and there is no hypercapnia.  She is frustrated by her quality of life with excessive cough.  The cough is driving most of the high COPD CAT score of 35.    Dr Felice Reflux Symptom Index (> 13-15 suggestive of LPR cough) 0 -> 5  =  none ->severe problem 11/13/2020   Hoarseness of problem with voice 1  Clearing  Of Throat 1  Excess throat mucus or feeling of post nasal drip 2  Difficulty swallowing food, liquid or tablets 0  Cough after eating or lying down 4.5  Breathing difficulties or choking episodes 3.5  Troublesome or annoying cough 4  Sensation of something sticking in throat or lump in throat 3  Heartburn, chest pain, indigestion, or stomach acid coming up  0  TOTAL 19    CAT Score 11/13/2020  Total CAT Score 35         ABG Feb 2022  Results for MIKEILA, BURGEN (MRN [id number]) as of 11/13/2020 09:07  Ref. Range 08/31/2020 13:35  Sample type Unknown ARTERIAL DRAW  FIO2 Unknown 32.00  pH, Arterial Latest Ref Range: 7.350 - 7.450  7.417  pCO2 arterial Latest Ref Range: 32.0 - 48.0 mmHg 37.3  pO2, Arterial Latest Ref Range: 83.0 - 108.0 mmHg 87.3   CT Chest data feb 2022   IMPRESSION: 1.  No acute process in the chest. 2. Moderate centrilobular and paraseptal emphysema. No other explanation for patient's symptoms. Emphysema (ICD10-J43.9). 3.  Aortic Atherosclerosis (ICD10-I70.0).     Electronically Signed   By: Rockey Kilts M.D.   On: 09/04/2020 09:01  12/11/2020 Follow up : COPD with emphysema, oxygen  dependent respiratory failure, chronic cough Patient returns for a 1 month follow-up.  Patient has underlying moderate COPD with emphysema.  She also has alpha-1 phenotype MZ with a normal level. She is maintained on oxygen  3 L at rest and 5 L with activity. Patient has been dealing with an ongoing chronic cough since COVID-19 infection in August  2020.  Cough is persistent and feels getting progressively worse. Cough is very frustrating with cough associated urinary incontinence .  High resolution CT chest in 2021 and repeat CT chest February 2022 showed no evidence of interstitial lung disease.  Moderate emphysema. Patient has been recommended on cough control regimen.  Last visit she was started on gabapentin  300 mg twice daily.  She was also changed from Trelegy to BREZTRI  in case the dry powder inhaler was aggravating her upper airway Patient says her cough is no better , unchanged .  Using Halls cough drops.  Has been tried on steroid tapers in the past without any improvement.  Is not using any cough syrups.  Denies any fever, discolored mucus, hemoptysis, chest pain cough is all through the day and night . Sometimes  worse at night     OV 01/19/2021  Subjective:  Patient ID: Maria [name], female , DOB: Sep 16, 1955 , age 51 y.o. , MRN: [id number] , ADDRESS: [address] [place] [facility] [address] PCP Velma Harlene SAUNDERS, MD Patient Care Team: Velma Harlene SAUNDERS, MD as PCP - General (Family Medicine)  This Provider for this visit: Treatment Team:  Attending Provider: Geronimo Amel, MD  Follow-up emphysema - alpha 1 MZ in dec 2020  -- On Trelegy for few years as of 2022.  Dose increased early 2022  -Insurance denied alpha-1 replacement in 2020 07/2020 Follow-up interstitial lung disease post COVID-19 in August 2020 -> feb 2021 Case conf ILD: NO ILD Follow-up chronic hypoxemic respiratory failure secondary to both Ex smoker 40 pack quit Echo sept 202 0 - ef 65% and diast dysfn Last CT scan of the chest February 2022  Chronic coough since jan 2022  01/19/2021 -   Chief Complaint  Patient presents with   Follow-up    Pt states she still has complaints of coughing which is about the same since last visit. States she is coughing up clear thick phlegm. States SOB is worse when she goes outside in the humidity but is fine if she stays inside.     HPI Maria [name] 68 y.o. -returns for follow-up of her COPD and severe chronic cough.  She continues to use 3 L of oxygen .  She says without oxygen  at rest she starts getting dizzy.  However after return the oxygen  off her pulse ox was 92% on room air at rest 10 minutes later.  Overall she feels she is stable but she continues to have significant amount of cough that she rates as moderate.  Wakes her up at night.  There is no associated wheezing.  She does clear her throat and has a laryngeal quality to the cough.  Last visit nurse practitioner added Chlor-Trimeton and Tessalon  and Robitussin but despite this cough persist.  She continues on gabapentin  3 mg twice daily.  She has never been to voice rehabilitation.  She is not sure if prednisone  has helped her cough  but she knows prednisone  makes her irritable but she is willing to give it a short-term empiric trial    CAT Score 12/11/2020 11/13/2020  Total CAT  Score 18 35     IMPRESSION: 1.  No acute process in the chest. 2. Moderate centrilobular and paraseptal emphysema. No other explanation for patient's symptoms. Emphysema (ICD10-J43.9). 3.  Aortic Atherosclerosis (ICD10-I70.0).     Electronically Signed   By: Rockey Kilts M.D.   On: 09/04/2020 09:18 Jul 2021   07/28/2021 Follow up : COPD with emphysema, oxygen  dependent respiratory failure, chronic cough Patient returns for 67-month follow-up.  Patient remains on Breztri  inhaler twice daily.  She continues to have ongoing chronic cough despite aggressive cough control regimen.  She is on gabapentin  300 mg 3 times daily, Robitussin DM every 4 hours, Tessalon  Perles and Chlor-Trimeton.  Along with reflux diet and PPI. Ran out of Duoneb 2 weeks ago. Typically uses Twice daily . Notices difference in breathing without it.  She remains on oxygen  3 L at rest and 5 L with activity.Uses POC when out from home. Really helps.  Overall feels she is doing some better. Still has cough but not quite is bad. Does have some tightness in chest and back on right side at times. No chest pain, palpitations,  Flu shot is utd. PVX is utd.   Lives at home with husband. Drives. Brother has COPD on O2 . Husband works fulltime Adult Public affairs consultant. 6 grandkids.  Stays active, light housework, shopping.   Patient's insurance will not cover Protonix  20 mg twice daily.  Will only cover Protonix  40 mg.  We discussed changing her prescription over She denies any increased reflux symptoms  OV 10/20/2021  Subjective:  Patient ID: Maria [name], female , DOB: Jun 23, 1956 , age 56 y.o. , MRN: [id number] , ADDRESS: [address] [place] [facility] [address] PCP Velma Harlene SAUNDERS, MD Patient Care Team: Velma Harlene SAUNDERS, MD as PCP - General (Family Medicine)  This Provider for this  visit: Treatment Team:  Attending Provider: Geronimo Amel, MD    10/20/2021 -   Chief Complaint  Patient presents with   Follow-up    2 mo f/u for COPD. Wants to switch from [employer] to [employer] for her O2. 3L at rest, 5L w/exertion.    Follow-up emphysema - alpha 1 MZ in dec 2020  -- On Trelegy for few years as of 2022.  Dose increased early 2022  -Insurance denied alpha-1 replacement in 2020 07/2020 Follow-up interstitial lung disease post COVID-19 in August 2020 -> feb 2021 Case conf ILD: NO ILD Follow-up chronic hypoxemic respiratory failure secondary to both Ex smoker 40 pack quit Echo sept 202 0 - ef 65% and diast dysfn Last CT scan of the chest February 2022  Chronic coough since jan 2022 -on gabapentin    HPI Maria [name] 68 y.o. -returns for follow-up.  Her COPD CAT score is actually better than previous 2022 but similar to May 2022.  Nevertheless she is on 3 L oxygen  at rest and 5 L with exertion.  She states in the last 6 months has been on prednisone  quite a few times for COPD exacerbation and antibiotics at least 2 times.  But currently she is feeling stable.  She does have chronic cough for which she is on a bunch of medications.  She wants to qualify for oxygen .  She is having insurance coverage issues.  She is waiting till she turns 58 in August 2023 before she can enroll in Medicare.  She is interested in joining the [facility] insurance program with Medicare.  Her last CT scan of the chest was a  year ago.  We discussed the prevention of COPD exacerbations.  We discussed Roflumilast .  She is interested in this.  We also discussed the potential insurance approval with Dupixent.  She is willing to get a CBC with differential checked.  We discussed upcoming potential clinical trials such as the one being sponsored by [employer] which might be available in the future.  She is interested in that if she qualifies.  At this point in time she is okay starting  Daliresp .  She was sitting on 3 L oxygen .  I turned this off to get a qualifying walk test done.       07/28/2021   10:24 AM 12/11/2020    9:24 AM 11/13/2020    8:49 AM  CAT Score  Total CAT Score 19 18 35     12/15/2021 Follow up : COPD with emphysema, oxygen  dependent respiratory failure Patient presents for a 78-month follow-up and preop surgical risk assessment.  Patient has underlying moderate COPD with emphysema.  She is oxygen  dependent she says overall she has been doing well with her breathing.  She still gets short of breath with minimal activities.  Has had no flare of her breathing recently.  No recent antibiotics or steroid use.  Patient was seen early last month.  She was set up for an ABG that showed no significant hypercarbia.  She has been set up for CT chest that is pending, routine as she has a heavy smoking history.  Also Daliresp  was added into her maintenance regimen.  She remains on Breztri  twice daily. Spirometry today in the office shows stable lung function with FEV1 at 66%, ratio 87, FVC 59%.  Going to have neck surgery [facility] in [place]. Neurosurgeon, Dr. Cleotilde . Has degenerative cervical spinal stenosis. Has chronic neck pain.  Has hard time dressing due to neck and left arm pain. Becoming hard to use left arm due to weakness.   Lives at home with spouse. Able to drive. Can do very light house chores and cooking . Can only walk short distances and has to rest. Gets winded easily .   Remains on Oxygen  3l/m at rest and 5l/m with activity .  She denies any increased oxygen  demands.    Was seen 02/15/22 by PCP with aecopd rx zpak and pred > no better  Dyspnea:  room to room but comfortable at rest / no cp  Cough: yellowish worse than usual x 02/12/22 very congested sounding / assoc nasal congestion  SABA use: nebulizer not helping   baseline using 3 x daily  /  5-6 x daily since onset of flare 02 turned up to 4lpm POC sats low 90s   Rx  prednisone   OV 03/31/2022  Subjective:  Patient ID: Maria [name], female , DOB: 25-Jul-1955 , age 34 y.o. , MRN: [id number] , ADDRESS: [address] [place] [facility] [id number] PCP Velma Harlene SAUNDERS, MD Patient Care Team: Velma Harlene SAUNDERS, MD as PCP - General (Family Medicine)  This Provider for this visit: Treatment Team:  Attending Provider: Geronimo Amel, MD  03/31/2022 -   Chief Complaint  Patient presents with   Follow-up    Pt states that she is still having problems with coughing. States sometimes when she coughs it is clear but will also have a yellow tint to it. Also has some wheezing associated.     HPI Maria [name] 68 y.o. -mabelene entry] she was seen early August for COPD exacerbation.  She is alpha-1  MZ.  She has quit smoking but still she is not feeling better.  She is having coughing occasional sputum and wheezing.  Symptoms seem worse than baseline and consistent with a COPD flareup.  She is frustrated with repeated flareups.  In 2020 when she was able to walk 3 L and this was adequate.  Currently using 3 L at rest and 5 L with exertion although it is unclear if it is based on subjective needs.  At follow-up we will check walking desaturation test on 3 and 4 L.  Her blood eosinophils have been normal in the past.    CT Chest data sept 022  IMPRESSION: 1. No acute cardiopulmonary process. 2. Aortic Atherosclerosis (ICD10-I70.0) and Emphysema (ICD10-J43.9).     Electronically Signed   By: Greig Pique M.D.   On: 03/27/2022 21:05  No results found.   Latest Reference Range & Units 10/02/08 22:05 11/09/16 22:06 02/27/19 03:45 02/28/19 01:15 03/01/19 03:22 03/02/19 01:55 03/03/19 01:54 03/19/19 09:56 11/18/19 13:26 10/20/21 09:53  Eosinophils Absolute 0.0 - 0.7 K/uL 0.1 0.2 0.0 0.0 0.0 0.0 0.0 0.1 0.2 0.1    OV 07/05/2022  Subjective:  Patient ID: Maria [name], female , DOB: 27-Feb-1956 , age 45 y.o. , MRN: [id number] , ADDRESS: [address] [place] [facility]  [id number] PCP [name], NP Patient Care Team: [name], NP as PCP - General (Nurse Practitioner)  This Provider for this visit: Treatment Team:  Attending Provider: Geronimo Amel, MD   07/05/2022 -   Chief Complaint  Patient presents with   Follow-up    Pt states she has been having some heaviness in her chest. States she is about the same as last visit.     HPI Maria [name] 68 y.o. -returns for follow-up.  After last visit she wanted to participate in [place] biological injection research protocol to prevent COPD exacerbations.  However she screen failed because of prolonged QTc which we attributed to her recent antidepressant.  We updated her primary care physician about it.  She continues on the same dose of antidepressant.  No arrhythmias and no hospitalizations due to cardiac issues since.  She went on a cruise with her family and return mid November 2023.  Then 1 or 2 days before Thanksgiving 2023 she developed COVID-19.  She believes she was treated with Paxlovid  and prednisone .  However the cough still lingers and she feels she is has an ongoing exacerbation.  She is open to the idea of retaking prednisone .  She is frustrated she did not qualify for research protocol.  While she was ill the Wednesday before Thanksgiving she did trip and then fractured her left lower extremity foot.  She has a boot on.  At this point in time for her pregnancy with exacerbation she is continuing on Breztri  and also Roflumilast .  She is never taken and acetylcysteine  [1 study from [place] showing benefit].  We tested her eosinophils in the past and they were normal so she did not qualify for Dupixent.   Of note: She recently got her living will and advanced care planning directives done.  She has assigned her daughter who I need a is the main healthcare power of attorney.  She also prefers to have a natural death and she has signed advanced directive papers for this.  Regarding her  prolonged QTc: In October 2023 QTc was 475 ms.  We did an EKG today and I personally visualized the trace.  Her QT is 4  and 8 ms.  Her QTc H is 429 ms..  This is equivalent to the QTc [place] of 434 ms for a heart rate of 72 bpm.  Much improved and normal PFT  OV 08/29/2022  Subjective:  Patient ID: Maria [name], female , DOB: 1956/02/02 , age 47 y.o. , MRN: [id number] , ADDRESS: [address] [place] [facility] [id number] PCP [name], NP Patient Care Team: [name], NP as PCP - General (Nurse Practitioner)  This Provider for this visit: Treatment Team:  Attending Provider: Geronimo Amel, MD    08/29/2022 -   Chief Complaint  Patient presents with   Follow-up    Cough  and wheeze persistent.  PFT today.     HPI Maria [name] 68 y.o. -returns for follow-up.  Is because of repeated flareups we got a pulmonary function test but it is actually stable in the last few years.  She is reassured by this.  She continues on her baseline 3 L oxygen , Breztri  inhaler, Roflumilast .  She is also started and acetylcysteine  over-the-counter.  She continues with Mucinex  as needed.  Despite all this her symptoms are still significant especially her chronic cough.  She was last seen in research for a screening study for frequent exacerbations by [place].  Since then no new emergency room visits.  No medication changes no hospitalizations no urgent care visits no antibiotics no prednisone .  However in the interim she did get rid of boot in her feet.  Her gabapentin  dose has been reduced.  She reports her cough is 4 out of 5.  The gabapentin  really has not helped.  She is using the gabapentin  for shoulder pain though.  She does have some thick mucus.  She is on Mucinex  already.  She has never tried 3% saline nebulizer.  She is willing to try this.  Chart review shows that she has never had RSV vaccine.    OV 05/07/2024  Subjective:  Patient ID: Maria [name], female , DOB: 02-22-1956 ,  age 83 y.o. , MRN: [id number] , ADDRESS: [address] [place] [facility] [id number] PCP [name], NP Patient Care Team: [name], NP as PCP - General (Nurse Practitioner) Ladona Heinz, MD as PCP - Cardiology (Cardiology)  This Provider for this visit: Treatment Team:  Attending Provider: Geronimo Amel, MD    Follow-up emphysema - alpha 1 MZ in dec 2020  -- On Trelegy for few years as of 2022.  Dose increased early 2022  -Insurance denied alpha-1 replacement in 2020 07/2020 Follow-up concern interstitial lung disease post COVID-19 in August 2020 -> feb 2021 Case conf ILD: NO ILD Follow-up chronic hypoxemic respiratory failure secondary to cp[d  -ABG without hypercapnia April 2023 Ex smoker 40 pack quit Echo sept 202 0 - ef 65% and diast dysfn Last CT scan of the chest February 2022 -> sep 2023   - emphysema +. No cancer  Chronic coough since jan 2022 -on gabapentin   05/07/2024 -   Chief Complaint  Patient presents with   COPD    Pt stated since LOV breathing hasn't been very well Dry cough that won't go away  SOB w and w/o exertion, pt stated she could just be sitting down and become SOB  Pt is on 5L of O2     HPI Maria [name] 68 y.o. -returns for standard of care follow-up.  She is a research patient but this is a standard of care visit.  She tells me  that compared to 1 year ago shortness of breath is better but stable compared to 6 months ago and 3 months ago.  She still continues to deal with significant dry cough it is severe.  This is unchanged in the last 1 year.  This is despite Robitussin and Tessalon  Perles.  Despite think she is stable and better she is also saying she is increasing more oxygen  need at rest.  Baseline she uses 3 L nasal cannula but sometimes she subjectively cranks it up to 5 L.  Today at 3 L nasal cannula she was saturating fine at 97%.  She is requesting palliative care relief for the shortness of breath.  But more so especially for the  cough.  We discussed opioid therapy and she is open to this.**  In terms of her COPD treatment she is on oxygen , Breztri  and recent injections every 2 weeks which she is tolerating well and is 100% compliant.  We looked at standard of care [facility] for her but is $800 on her insurance per month and therefore she does not want to do it.  Last eosinophil check was few years ago [standard of care medications or Dupixent and Nucala] last blood gas was a couple of years ago and she did not have hypercapnia.  If she does have hypercapnia she will qualify for BiPAP.  These measures can improve her functional status.   Of note she had a PET scan she has a new lung nodule this month on the CT and the PET scan is low indeterminate.  Repeat follow-up in 3 months is recommended.  I did share these results with her.  This would be an adverse event for the trial unrelated to the study drug.  Routine finding.  The lung nodule is 1 cm.    CT Chest data from date: 04/24/24  - personally visualized and independently interpreted : .  Personally visualized - my findings are: Agree with these findings   IMPRESSION: 1. New 11.6 mm posterior left lower lobe nodule. Lung-RADS 4B, suspicious. Additional imaging evaluation or consultation with Pulmonology or Thoracic Surgery recommended. These results will be called to the ordering clinician or representative by the Radiologist Assistant, and communication documented in the PACS or [employer]. 2. Aortic atherosclerosis (ICD10-I70.0). Coronary artery calcification. 3.  Emphysema (ICD10-J43.9).     Electronically Signed   By: Newell Eke M.D.   On: 04/24/2024 10:50   PET SCAN 05/03/24   BONES AND SOFT TISSUE: A focus of hypermetabolism about the anterior aspect of the right femoral head-neck junction is likely due to muscular tendinous strain at SUV 4.9. No adjacent osseous abnormality. Osteopenia. Right femoral head avascular necrosis suspected  on image 140/6. Probable bone island in the left iliac. Cervical spine fixation. No metabolically active aggressive osseous lesion.   IMPRESSION: 1. The left lower lobe pulmonary nodule of interest is less apparent today, likely due to nondedicated technique and volume loss at the lung bases. No correlate hypermetabolism. Given that the nodule is at the low end of PET resolution, this remains indeterminate and lung cancer screening CT follow-up in 3 months is recommended. 2. Aortic atherosclerosis (ICD10-I70.0), coronary artery atherosclerosis, and emphysema (ICD10-J43.9). 3. Probable right femoral head avascular necrosis   Electronically signed by: Rockey Kilts MD 05/06/2024 04:05 PM EDT RP Workstation: [id number]     PFT     Latest Ref Rng & Units 08/29/2022    8:29 AM 11/12/2020   10:43 AM 01/16/2020   10:57 AM  06/24/2019    8:45 AM  PFT Results  FVC-Pre L 2.79  2.94  3.17  2.93   FVC-Predicted Pre % 83  84  90  83   FVC-Post L  3.18  3.25  3.39   FVC-Predicted Post %  91  92  96   Pre FEV1/FVC % % 57  54  55  56   Post FEV1/FCV % %  54  56  59   FEV1-Pre L 1.60  1.60  1.75  1.64   FEV1-Predicted Pre % 62  59  65  60   FEV1-Post L  1.71  1.83  1.99   DLCO uncorrected ml/min/mmHg 9.85  8.06  10.81  12.24   DLCO UNC% % 47  37  50  57   DLCO corrected ml/min/mmHg 9.85  8.06  11.29    DLCO COR %Predicted % 47  37  52    DLVA Predicted % 50  57  61  60   TLC L  4.89  5.25  5.81   TLC % Predicted %  91  98  108   RV % Predicted %  88  86  110        LAB RESULTS last 96 hours NM PET Image Initial (PI) Skull Base To Thigh Result Date: 05/06/2024 EXAM: PET AND CT SKULL BASE TO MID THIGH 05/03/2024 01:18:26 PM TECHNIQUE: RADIOPHARMACEUTICAL: 8.75 mCi F-18 FDG Uptake time 60 minutes. Glucose level 91 mg/dl. PET imaging was acquired from the base of the skull to the mid thighs. Non-contrast enhanced computed tomography was obtained for attenuation correction and anatomic  localization. COMPARISON: Chest CT of 10/25 and PET of 06/26/2023. CLINICAL HISTORY: Lung nodule, > 8mm; 11.6 mm nodule since 06/2023 PET. 8.33mCi FDG GIVEN VIA LT HAND IV; BG = 91mg /dL; *LAST PET/CT SCAN [id number]; LUNG NODULE; LUNG >75mm; PERSONAL HX OF TOBACCO USE PRESENTING HAZARDS TO HEALTH; SCREENING FOR MALIGNANT NEOPLASM OF RESPIRATORY ORGAN; NO HX DM; NO RECENT SX / BX; NO ANTIBIOTICS / STEROIDS FINDINGS: HEAD AND NECK: No cervical nodal hypermetabolism or adenopathy. Right maxillary sinus mucous retention cyst or polyp. Left carotid stent right carotid atherosclerosis. CHEST: No thoracic nodal hypermetabolism. The previously described left lower lobe pulmonary nodule is less apparent today, secondary to non-dedicated CT technique and poor inspiratory effort, likely identified at 8 mm on image 74/6. There is volume loss at the lung bases. No correlate hypermetabolism in this area. . Aortic and LAD coronary artery calcification. Aortic atherosclerosis. Moderate centrilobular emphysema. ABDOMEN AND PELVIS: No abdominal or pelvic nodal or parenchymal hypermetabolism. Cholecystectomy. A 6 mm upper pole right renal hyperattenuating lesion is too small to characterize but favored to represent a complex cyst. Colonic stool burden suggests constipation. Scattered colonic diverticula. Hysterectomy. Pelvic floor laxity. Physiologic activity within the gastrointestinal and genitourinary systems. BONES AND SOFT TISSUE: A focus of hypermetabolism about the anterior aspect of the right femoral head-neck junction is likely due to muscular tendinous strain at SUV 4.9. No adjacent osseous abnormality. Osteopenia. Right femoral head avascular necrosis suspected on image 140/6. Probable bone island in the left iliac. Cervical spine fixation. No metabolically active aggressive osseous lesion. IMPRESSION: 1. The left lower lobe pulmonary nodule of interest is less apparent today, likely due to nondedicated technique and volume  loss at the lung bases. No correlate hypermetabolism. Given that the nodule is at the low end of PET resolution, this remains indeterminate and lung cancer screening CT follow-up in 3 months is recommended. 2. Aortic  atherosclerosis (ICD10-I70.0), coronary artery atherosclerosis, and emphysema (ICD10-J43.9). 3. Probable right femoral head avascular necrosis Electronically signed by: Rockey Kilts MD 05/06/2024 04:05 PM EDT RP Workstation: [id number]         has a past medical history of Anginal pain, Anxiety, Atherosclerosis ([id number]), Blood transfusion without reported diagnosis (2005), Cancer (HCC) (Ovarian 1978), Chest pain, COPD (chronic obstructive pulmonary disease) (HCC) (2019), Coronary artery disease, COVID-19 long hauler manifesting chronic dyspnea, Depression (December), Dyspnea, Emphysema of lung (HCC) (2019), Oxygen  deficiency (Sept 2020), Oxygen  dependent, Peripheral vascular disease, and Pneumonia.   reports that she quit smoking about 8 years ago. Her smoking use included cigarettes. She started smoking about 53 years ago. She has a 45 pack-year smoking history. She has been exposed to tobacco smoke. She has never used smokeless tobacco.  Past Surgical History:  Procedure Laterality Date   ABDOMINAL HYSTERECTOMY  1978   BREAST BIOPSY Left 04/04/2023   Stereo bx, X clip, path pending   BREAST BIOPSY Left 04/04/2023   MM LT BREAST BX W LOC DEV 1ST LESION IMAGE BX SPEC STEREO GUIDE 04/04/2023 [facility]   CAROTID PTA/STENT INTERVENTION Left 03/07/2023   Procedure: CAROTID PTA/STENT INTERVENTION;  Surgeon: Jama Cordella MATSU, MD;  Location: [facility] INVASIVE CV LAB;  Service: Cardiovascular;  Laterality: Left;   CHOLECYSTECTOMY  2010   COLONOSCOPY     COLONOSCOPY     COLONOSCOPY WITH PROPOFOL  N/A 11/01/2021   Procedure: COLONOSCOPY WITH PROPOFOL ;  Surgeon: Therisa Bi, MD;  Location: [facility] ENDOSCOPY;  Service: Gastroenterology;  Laterality: N/A;   ESOPHAGOGASTRODUODENOSCOPY      NECK SURGERY  1999   Trach   OVARIAN CYST REMOVAL Bilateral 1988   SPINE SURGERY  Neck 2002   TRANSCAROTID ARTERY REVASCULARIZATION  Left 01/12/2024   Procedure: TRANSCAROTID ARTERY REVASCULARIZATION (TCAR);  Surgeon: Serene Gaile ORN, MD;  [facility] OR;  Service: Vascular;  Laterality: Left;    Allergies  Allergen Reactions   Montelukast      Bad dreams    Immunization History  Administered Date(s) Administered   Fluad Quad(high Dose 65+) 03/30/2022   INFLUENZA, HIGH DOSE SEASONAL PF 03/30/2022, 03/27/2023, 03/29/2024   Influenza, Seasonal, Injecte, Preservative Fre 04/28/2008, 04/09/2010   Influenza,inj,Quad PF,6+ Mos 04/22/2019, 04/25/2020, 03/15/2021   Influenza-Unspecified 06/01/2008, 05/10/2016, 03/18/2017   Moderna Sars-Covid-2 Vaccination 07/30/2019, 08/28/2019, 06/27/2020   PNEUMOCOCCAL CONJUGATE-20 07/06/2022   Pfizer Covid-19 Vaccine Bivalent Booster 25yrs & up 07/28/2021   Pneumococcal Polysaccharide-23 04/15/2015, 04/22/2020   RSV,unspecified 07/06/2022   Respiratory Syncytial Virus Vaccine,Recomb Aduvanted(Arexvy) 05/30/2022   Td 10/01/2020   Tdap 11/17/2009, 03/27/2023   Zoster Recombinant(Shingrix) 04/16/2020, 06/22/2020    Family History  Problem Relation Age of Onset   Heart failure Mother    Diabetes Mother    Kidney disease Mother    Hypertension Mother    Arthritis Mother    Heart disease Mother    Obesity Mother    COPD Father    Hearing loss Father    Lung cancer Brother    COPD Brother    COPD Brother    Asthma Son    Diabetes Son    Birth defects Son    Breast cancer Maternal Aunt 40     Current Outpatient Medications:    albuterol  (VENTOLIN  HFA) 108 (90 Base) MCG/ACT inhaler, Inhale 2 puffs into the lungs every 6 (six) hours as needed for wheezing or shortness of breath., Disp: 8.5 each, Rfl: 5   ASPIRIN  LOW DOSE 81 MG EC tablet, TAKE 1 TABLET BY MOUTH EVERY  DAY, Disp: 90 tablet, Rfl: 3   atorvastatin  (LIPITOR) 20 MG tablet, Take 1  tablet (20 mg total) by mouth daily., Disp: 90 tablet, Rfl: 1   Azelastine  HCl 137 MCG/SPRAY SOLN, USE 1 SPRAY INTO EACH NOSTRIL TWICE A DAY, Disp: 30 mL, Rfl: 1   benzonatate  (TESSALON ) 200 MG capsule, Take 1 capsule by mouth three times daily as needed, Disp: 90 capsule, Rfl: 0   Budeson-Glycopyrrol-Formoterol  (BREZTRI  AEROSPHERE) 160-9-4.8 MCG/ACT AERO, Inhale 2 puffs into the lungs in the morning and at bedtime., Disp: 3 each, Rfl: 3   clopidogrel  (PLAVIX ) 75 MG tablet, Take 1 tablet (75 mg total) by mouth daily., Disp: 90 tablet, Rfl: 3   gabapentin  (NEURONTIN ) 600 MG tablet, Take 1 tablet (600 mg total) by mouth every 8 (eight) hours., Disp: 90 tablet, Rfl: 2   hydrOXYzine  (VISTARIL ) 25 MG capsule, TAKE 1 CAPSULE BY MOUTH AT BEDTIME AS NEEDED, Disp: 90 capsule, Rfl: 0   ipratropium-albuterol  (DUONEB) 0.5-2.5 (3) MG/3ML SOLN, USE 1 AMPULE IN NEBULIZER 4 TIMES DAILY AS NEEDED, Disp: 360 mL, Rfl: 0   isosorbide  mononitrate (IMDUR ) 30 MG 24 hr tablet, Take 1 tablet (30 mg total) by mouth every morning., Disp: 30 tablet, Rfl: 11   [Paused] metoprolol  succinate (TOPROL -XL) 25 MG 24 hr tablet, Take 1 tablet (25 mg total) by mouth every evening. Take with or immediately following a meal., Disp: 30 tablet, Rfl: 2   Multiple Vitamins-Minerals (MULTIVITAMIN WITH MINERALS) tablet, Take 1 tablet by mouth daily. Alive Woman 50+, Disp: , Rfl:    nitroGLYCERIN  (NITROSTAT ) 0.4 MG SL tablet, Place 1 tablet (0.4 mg total) under the tongue every 5 (five) minutes as needed., Disp: 25 tablet, Rfl: 1   OVER THE COUNTER MEDICATION, Place 1 drop into both eyes daily as needed (Dry and itching eyes). Thera Tears, Disp: , Rfl:    oxybutynin  (DITROPAN -XL) 10 MG 24 hr tablet, Take 1 tablet (10 mg total) by mouth daily., Disp: 90 tablet, Rfl: 3   OXYGEN , Inhale 3-5 L into the lungs continuous. 3-4 liters at rest and 5 liters with any activity.-, Disp: , Rfl:    Respiratory Therapy Supplies (FLUTTER) DEVI, Use as directed,  Disp: 1 each, Rfl: 0   roflumilast  (DALIRESP ) 500 MCG TABS tablet, Take 1 tablet (500 mcg total) by mouth daily., Disp: 30 tablet, Rfl: 11   sertraline  (ZOLOFT ) 50 MG tablet, Take 1 tablet by mouth once daily, Disp: 90 tablet, Rfl: 0   sodium chloride  HYPERTONIC 3 % nebulizer solution, Take by nebulization 2 (two) times daily., Disp: 750 mL, Rfl: 12  Current Facility-Administered Medications:    Study - ARNASA [id number] (open-label) - astegolimab 238 mg/1.7 mL SQ injection (PI-Suvi Archuletta), 476 mg, Subcutaneous, Q14 Days, , 476 mg at 01/03/24 0955   Study - ARNASA [id number] (open-label) - astegolimab 238 mg/1.7 mL SQ injection (PI-Tyger Oka), 476 mg, Subcutaneous, Q14 Days, , 476 mg at 01/17/24 1006   Study - ARNASA [id number] (open-label) - astegolimab 238 mg/1.7 mL SQ injection (PI-Drayden Lukas), 476 mg, Subcutaneous, Q14 Days, , 476 mg at 01/31/24 1141   Study - ARNASA [id number] (open-label) - astegolimab 238 mg/1.7 mL SQ injection (PI-Lawerence Dery), 476 mg, Subcutaneous, Q14 Days, , 476 mg at 02/28/24 1002   Study - ARNASA [id number] (open-label) - astegolimab 238 mg/1.7 mL SQ injection (PI-Hallee Mckenny), 476 mg, Subcutaneous, Q14 Days, , 476 mg at 03/13/24 1002   Study - ARNASA [id number] (open-label) - astegolimab 238 mg/1.7 mL SQ injection (PI-Roberto Romanoski), 476 mg, Subcutaneous, Q14 Days, , 476 mg  at 03/27/24 1019   Study - ARNASA [id number] (open-label) - astegolimab 238 mg/1.7 mL SQ injection (PI-Moncia Annas), 476 mg, Subcutaneous, Q14 Days, , 476 mg at 04/10/24 0856   Study - ARNASA [id number] (open-label) - astegolimab 238 mg/1.7 mL SQ injection (PI-Tanisia Yokley), 476 mg, Subcutaneous, Q14 Days, , 476 mg at 04/24/24 0844   Study - ARNASA [id number] - astegolimab 238 mg/1.7 mL or placebo SQ injection (PI-Damarea Merkel), 476 mg, Subcutaneous, Q14 Days,    Study - ARNASA [id number] - astegolimab 238 mg/1.7 mL or placebo SQ injection (PI-Malayah Demuro), 476 mg, Subcutaneous, Q14 Days, , 476 mg at 09/12/23 0930      Objective:   Vitals:    05/07/24 0854  BP: 124/72  Pulse: (!) 58  Temp: 98.2 F (36.8 C)  TempSrc: Oral  SpO2: 98%  Weight: 185 lb (83.9 kg)  Height: 5' 5 (1.651 m)    Estimated body mass index is 30.79 kg/m as calculated from the following:   Height as of this encounter: 5' 5 (1.651 m).   Weight as of this encounter: 185 lb (83.9 kg).  @WEIGHTCHANGE @  [employer]   05/07/24 0854  Weight: 185 lb (83.9 kg)     Physical Exam   General: No distress. Looks ssam O2 at rest: yes 3l [place] 97% Cane present: YES Sitting in wheel chair: no Frail: no Obese: no Neuro: Alert and Oriented x 3. GCS 15. Speech normal Psych: Pleasant Resp:  Barrel Chest - YES.  Wheeze - no, Crackles - no, No overt respiratory distress CVS: Normal heart sounds. Murmurs - o Ext: Stigmata of Connective Tissue Disease - no HEENT: Normal upper airway. PEERL +. No post nasal drip        Assessment/     Assessment & Plan Stage 4 very severe COPD by GOLD classification (HCC)  Chronic respiratory failure with hypoxia (HCC)  Nodule of lower lobe of left lung  Avascular necrosis of hip, right [facility])    PLAN Patient Instructions  Stage 4 very severe COPD by GOLD classification (HCC) Chronic respiratory failure with hypoxia (HCC)  -Stable disease without flareup although you might of progressed over time but it is unclear if you have - 3L [place] pulse ox 97% RA at r 50est  Plan - Do spirometry without DLCO in 3 months - Do blood work for CBC differential and blood IgE   - If blood eosinophils high we will have to take it physician about starting you on Dupixent or Nucala - Do blood work for arterial blood gas; if there is high, dioxide will recommend BiPAP  - continue oxygen  3-5 L - Continue - Breztri  scheduled - Continue research injections for now  - UNABLE TO  afford othuvayre  Severe chronic cough due to COPD  -Cough significantly affecting quality of life and not controlled despite Robitussin and Tessalon   Perles  PLAN - Hycodan cough syrup 5 mL twice daily for palliative relief of cough - Continue Robitussin as needed - Continue Tessalon  Perles as needed  Nodule of lower lobe of left lung - OCt 2025 - NEew. Low uptake on PET  - this might not be cancer  Plan  - repeat ct chest without contrast in 3 months  Avascular necrosis of hip, right (HCC)- *  - *Probably the reason for hip pain and using a cane  Plan - Please talk to primary care about this  Fllowup  - 3 months staandard of care visit with APP Camie after CT chest - keep q2 week  research visit     FOLLOWUP    Return for  - 3 months staandard of care visit with APP Camie after CT chest.    SIGNATURE    Dr. Dorethia Cave, M.D., F.C.C.P,  Pulmonary and Critical Care Medicine Staff Physician, [facility] Director - Interstitial Lung Disease  Program  Pulmonary Fibrosis [facility] at [facility] [place], [place], [address]  Pager: [phone], If no answer or between  15:00h - 7:00h: call [phone] Telephone: [phone]  9:24 AM 05/07/2024       HIGh Complexity  OFFICE   2021 E/M guidelines, first released in 2021, with minor revisions added in 2023. Must meet the requirements for 2 out of 3 dimensions to qualify.    Number and complexity of problems addressed Amount and/or complexity of data reviewed Risk of complications and/or morbidity  Severe exacerbation of chronic illness  Acute or chronic illnesses that may pose a threat to life or bodily function, e.g., multiple trauma, acute MI, pulmonary embolus, severe respiratory distress, progressive rheumatoid arthritis, psychiatric illness with potential threat to self or others, peritonitis, acute renal failure, abrupt change in neurological status Must meet the requirements for 2 of 3 of the categories)  Category 1: Tests and documents, historian  Any combination of 3 of the following:  Assessment  requiring an independent historian  Review of prior external note(s) from each unique source  Review of results of each unique test  Ordering of each unique test    Category 2: Interpretation of tests    Independent interpretation of a test performed by another physician/other qualified health care professional (not separately reported)  Category 3: Discuss management/tests  Discussion of management or test interpretation with external physician/other qualified health care professional/appropriate source (not separately reported)  HIGH risk of morbidity from additional diagnostic testing or treatment Examples only:  Drug therapy requiring intensive monitoring for toxicity  Decision for elective major surgery with identified pateint or procedure risk factors  Decision regarding hospitalization or escalation of level of care  Decision for DNR or to de-escalate care   Parenteral controlled  substances

## 2024-05-07 ENCOUNTER — Encounter: Payer: Self-pay | Admitting: Internal Medicine

## 2024-05-07 ENCOUNTER — Ambulatory Visit: Admitting: Internal Medicine

## 2024-05-07 VITALS — BP 124/72 | HR 58 | Temp 98.2°F | Ht 65.0 in | Wt 185.0 lb

## 2024-05-07 DIAGNOSIS — J449 Chronic obstructive pulmonary disease, unspecified: Secondary | ICD-10-CM

## 2024-05-07 DIAGNOSIS — R911 Solitary pulmonary nodule: Secondary | ICD-10-CM | POA: Diagnosis not present

## 2024-05-07 DIAGNOSIS — J9611 Chronic respiratory failure with hypoxia: Secondary | ICD-10-CM | POA: Diagnosis not present

## 2024-05-07 DIAGNOSIS — M87051 Idiopathic aseptic necrosis of right femur: Secondary | ICD-10-CM | POA: Diagnosis not present

## 2024-05-07 LAB — CBC WITH DIFFERENTIAL/PLATELET
Basophils Absolute: 0.1 K/uL (ref 0.0–0.1)
Basophils Relative: 0.8 % (ref 0.0–3.0)
Eosinophils Absolute: 0.4 K/uL (ref 0.0–0.7)
Eosinophils Relative: 4.8 % (ref 0.0–5.0)
HCT: 33.7 % — ABNORMAL LOW (ref 36.0–46.0)
Hemoglobin: 11.1 g/dL — ABNORMAL LOW (ref 12.0–15.0)
Lymphocytes Relative: 26.8 % (ref 12.0–46.0)
Lymphs Abs: 2.2 K/uL (ref 0.7–4.0)
MCHC: 33.1 g/dL (ref 30.0–36.0)
MCV: 91.8 fl (ref 78.0–100.0)
Monocytes Absolute: 0.6 K/uL (ref 0.1–1.0)
Monocytes Relative: 6.9 % (ref 3.0–12.0)
Neutro Abs: 5 K/uL (ref 1.4–7.7)
Neutrophils Relative %: 60.7 % (ref 43.0–77.0)
Platelets: 300 K/uL (ref 150.0–400.0)
RBC: 3.67 Mil/uL — ABNORMAL LOW (ref 3.87–5.11)
RDW: 13.1 % (ref 11.5–15.5)
WBC: 8.2 K/uL (ref 4.0–10.5)

## 2024-05-07 MED ORDER — HYDROCODONE BIT-HOMATROP MBR 5-1.5 MG/5ML PO SOLN: 5.0000 mL | Freq: Two times a day (BID) | ORAL | 0 refills | Status: DC | PRN

## 2024-05-07 NOTE — Patient Instructions (Signed)
 Stage 4 very severe COPD by GOLD classification (HCC) Chronic respiratory failure with hypoxia (HCC)  -Stable disease without flareup although you might of progressed over time but it is unclear if you have - 3L Rhea pulse ox 97% RA at r 50est  Plan - Do spirometry without DLCO in 3 months - Do blood work for CBC differential and blood IgE   - If blood eosinophils high we will have to take it physician about starting you on Dupixent or Nucala - Do blood work for arterial blood gas; if there is high, dioxide will recommend BiPAP  - continue oxygen  3-5 L - Continue - Breztri  scheduled - Continue research injections for now  - UNABLE TO  afford othuvayre  Severe chronic cough due to COPD  -Cough significantly affecting quality of life and not controlled despite Robitussin and Tessalon  Perles  PLAN - Hycodan cough syrup 5 mL twice daily for palliative relief of cough - Continue Robitussin as needed - Continue Tessalon  Perles as needed  Nodule of lower lobe of left lung - OCt 2025 - NEew. Low uptake on PET  - this might not be cancer  Plan  - repeat ct chest without contrast in 3 months  Avascular necrosis of hip, right (HCC)- *  - *Probably the reason for hip pain and using a cane  Plan - Please talk to primary care about this  Fllowup  - 3 months staandard of care visit with APP Camie after CT chest - keep q2 week research visit

## 2024-05-08 ENCOUNTER — Encounter: Admitting: Internal Medicine

## 2024-05-08 ENCOUNTER — Other Ambulatory Visit (HOSPITAL_COMMUNITY): Payer: Self-pay

## 2024-05-08 ENCOUNTER — Encounter (HOSPITAL_COMMUNITY): Payer: Self-pay

## 2024-05-08 ENCOUNTER — Other Ambulatory Visit: Payer: Self-pay | Admitting: Cardiology

## 2024-05-08 DIAGNOSIS — Z006 Encounter for examination for normal comparison and control in clinical research program: Secondary | ICD-10-CM

## 2024-05-08 DIAGNOSIS — I2089 Other forms of angina pectoris: Secondary | ICD-10-CM

## 2024-05-08 LAB — IGE: IgE (Immunoglobulin E), Serum: 262 kU/L — ABNORMAL HIGH (ref <=114)

## 2024-05-08 MED ORDER — STUDY - ARNASA GB43374 (OPEN-LABEL) - ASTEGOLIMAB 238 MG/1.7 ML SQ INJECTION (PI-RAMASWAMY)
476.0000 mg | INJECTION | SUBCUTANEOUS | Status: DC
Start: 2024-05-08 — End: 2024-06-05
  Administered 2024-05-08: 476 mg via SUBCUTANEOUS
  Filled 2024-05-08: qty 3.4

## 2024-05-08 NOTE — Research (Signed)
 Title: A phase III Open-Label extension study to evaluate the long-term safety of Astegolimab in patients with Chronic Obstructive Pulmonary disease.   Dose and Duration of Treatment: Astegolimab is presented as a sterile, slightly brown-yellow solution. Each single-use, 2.25 mL pre-filled syringe contains 1.7 mL deliverable volume. Astegolimab drug product is formulated at 140 mg/mL astegolimab with 114 mM succinic acid, 200 mM L-arginine, 10 mM L-methionine, 0.06% (w/v) polysorbate 20, pH 5.7. Placebo for astegolimab is supplied in an identical pre-filled syringe configuration.   Protocol # [id number] Sponsor: [employer] [address] [place]   Protocol Version: v3 dated 15Oct2024 IB: version 9.0 dated April 2024 ICF:  ICFs: Make sure that the subject is re-consented with the updated ICFs Main ICF: [employer] IR[employer] Approved Version 15 Jun 2023, Revised 28Nov2024 Mobile Nursing: [employer] IR[employer] Approved Version 26Oct2023, Revised 08Aug2024 Optional Home Study Treatment: IRB Approved Version 12 May 2022 Revised 23 Feb 2023 ADDENDUM 1: IRB Approved Version 24Jul2024 Revised 8Aug2024 Lab Manual: V2.1.0 dated 21Jun2024   Mechanism of action: Astegolimab (also known as [id number] or [id number]) is a fully human, IgG2 monoclonal antibody that binds with high affinity to the interleukin (IL)-33 (IL-33) receptor, ST2, thereby blocking the signaling of IL-33, an inflammatory cytokine of the IL-1 family and member of the alarmin class of molecules. Astegolimab binds with high affinity to the human and cynomolgus monkey receptor for IL-33, ST2, and blocks IL-33 binding, thus inhibiting association with the IL-1R accessory protein (AcP) co-receptor and formation of an activated receptor complex.   Key Inclusion Criteria: Able and willing to provide written informed consent and to comply with the study protocol. Ability to comply with the requirements of study protocol, according  to the investigators's best judgement. Completion of the 52-week treatment period in either [place] or Study HA55667.   Key Exclusion Criteria: Significant non- compliance in the parent study, specifically defined as missing scheduled visits, per investigators judgement. Any new clinically significant pulmonary disease other than COPD since enrolling in the study. Unstable cardiac disease, myocardial infarction, or [place]  Heart Association Class III or IV heart failure since enrolling in the parent study.   Integrated Pharmacokinetic/Pharmacodynamic Analysis:  In Studies HA60757, [id number], and [id number], exploratory biomarker analysis showed there were consistent decreases in blood eosinophil counts throughout the treatment period, potentially mediated by a direct effect of IL-33 on eosinophil progenitors. In [place], there was no significant difference in fractional exhaled nitric oxide (FeNO) levels (reflecting airway IL-4/IL-13 activity) between astegolimab-treated groups relative to placebo throughout the treatment period. These data suggest that astegolimab has only a limited effect on Type 2 inflammation in asthma. No data are applicable for Study [id number]. No data are available yet for Study HA56688.   Special Warnings/Considerations: Administration of astegolimab, a protein therapeutic, may lead to the development of anti-astegolimab antibodies which could lead to AEs and/or decreased exposure. In non-clinical studies (see Section 4.2), ADA incidence has generally been low and there was no apparent ADA impact on PK and safety in these studies. To date, the immunogenicity rates observed with astegolimab in clinical studies have been relatively low as well (see Immunogenicity Section 5.6). Several clinical studies have been conducted in patients with asthma, atopic dermatitis, COPD (IIS Study HA59431) and COVID-19 severe pneumonia, which generally showed low incidence of ADAs (Table 31).  There has been no correlation between ADA status and clinical findings or increased incidence of AEs. Astegolimab is now being considered in a larger study for the treatment of  COPD. This patient population is typically considered to have a hyper-responsive immune system. Route of administration for this molecule will be [place], either Q2W or Q4W. These factors increase the risk of development of an immune response to astegolimab, specifically with repeat dosing. From the previous IIS Study HA59431, incidence of ADAs was low in COPD patients; this remains to be confirmed in a larger study. To monitor ADA development in ongoing studies, serum samples will be collected from patients at protocol-defined intervals. Patients who test positive for antibodies and have clinical sequelae that are considered potentially related to an ADA response may also be asked to return for additional follow-up testing.   Drug Interaction Studies: No PK drug interaction studies have been conducted to date.   Serious Adverse Reactions Observed in Asthma Study: During the treatment period, a similar proportion of patients across all cohorts experienced at least one AE, regardless of causality (Table 22). In total, 77.2%, 70.9%, 72.2%, and 72.1% of patients reported at least 1 AE in the placebo, 70 mg, 210 mg, and 490 mg groups, respectively. The most common AE's (>5% in any treatment group) were asthma, nasopharyngitis, upper respiratory tract infection, headache, and injection site reaction (ISR). The most common drug-related AE was ISR, which was reported more frequently in the astegolimab treatment groups than in the placebo group (1 [0.8%] patient in the placebo group, 10 [7.9%] patients with 70 mg, 8 [6.3%] patients with 210 mg, 6 [4.9%] patients with 490 mg). All ISRs were non-serious and mild or moderate in severity. During the treatment period, 50 SAEs were reported in 37 (7.4%) patients. The number of patients reporting SAEs was  comparable across all cohorts (11 SAEs in 8 patients on placebo, 21 SAEs in 14 patients on 70 mg, 11 SAEs in 9 patients on 210 mg, and 7 SAEs in 6 patients on 490 mg). The most common SAE was asthma. One SAE of moderate livedo reticularis (70 mg) was considered a suspected unexpected serious adverse drug reaction (SUSAR) related to astegolimab and was reported two days after the second dose, leading to discontinuation of astegolimab. Two (0.4%) patients reported anaphylaxis and hypersensitivity reactions: 1 severe SAE of anaphylactic reaction (placebo), and 1 moderate hypersensitivity (490 mg) considered related to astegolimab. Three (0.6%) patients experienced a potential Major Adverse Cardiac Event (MACE) (1 patient each in the placebo, 70 mg, and 210 mg groups). None of the potential MACE were considered related to astegolimab. Overall, 233 (46.4%) patients reported events of infection. A comparable number of patients reported infection across all treatment groups (65 [51.2%] patients on placebo, 55 [43.3%] patients on 70 mg, 58 [46.0%] patients on 210 mg, and 55 [45.1%] patients on 490 mg). The most frequently reported infection (>=10% incidence) was nasopharyngitis (12.7%). One patient on astegolimab 210 mg and the partner of one patient on placebo became pregnant during the study. Both delivered normal/healthy babies. Two deaths, unrelated to study drug, were reported: one patient on 210 mg astegolimab died following an SAE of asthma; the other patient on 490 mg astegolimab had an unexplained death. There were no clinically meaningful changes in laboratory parameters, vital signs, or ECG results, other than the 10% decrease in mean blood eosinophil counts in the astegolimab-treated groups, with no safety concerns. Treatment-induced ADAs were comparable between the astegolimab groups and had no impact on safety. Overall, astegolimab was well tolerated at all doses used and had a safety profile consistent with  that observed in the previous astegolimab Phase I studies.   Serious  Adverse Reactions Observed in Previous COPD Study: In the completed IIS Study HA59431, a total of 81 patients received at least one dose of astegolimab or placebo. The safety profile of astegolimab was similar to that of placebo. There were a total of 222 AEs reported in 62 patients. A total of 28 (72%) patients in the placebo arm and 34 (81%) patients in the astegolimab arm reported at least one AE. The most commonly reported AEs were headache followed by urinary tract infection and viral upper respiratory tract infection. A total of 39 SAEs were reported in 28 (35%) patients; 16 (41%) patients in the placebo group and 12 (29%) in the astegolimab group. The most commonly reported SAE was hospital admission for community acquired pneumonia. Four SAEs resulted in patient discontinuation from treatment (1 patient in the placebo group and 3 patients in the astegolimab group). One patient in the placebo group experienced an AESI of potential MACE (heart failure, unrelated to the study treatment). No anaphylaxis or pregnancies were reported. Two deaths, unrelated to study treatment, were reported: 1 patient in the placebo group died after hospital acquired pneumonia, and another patient in the placebo group died after pneumonia and type 2 respiratory failure.   Safety Data: Astegolimab has been generally well tolerated. There have been 49 patient deaths across all astegolimab studies (Section 5.5.2), none of which were considered related to astegolimab. A total of 144 subjects experienced a total of 224 SAEs across all astegolimab studies. Of these, an SAE livedo reticularis observed in 1 patient was considered related to astegolimab by the investigator (Section 5.5.3). AEs leading to withdrawal (Section 5.5.4) have generally occurred at a rate similar to what would be expected for clinical trials in the studies' respective indications.   [facility]  @ [place] Clinical Research Coordinator note:    This visit for Subject: [id number] DOB: 10/12/1955 on 22Oct2025 for the above protocol is Visit/Encounter # week 26 and is for purpose of research.    Subject expressed continued interest and consent in continuing as a study subject. Subject confirmed that there was no change in contact information (e.g. address, telephone, email). Subject thanked for participation in research and contribution to science. The Subject was informed that the PI Dr. Geronimo continues to have oversight of the subject's visits and course through relevant discussions, reviews and specifically of this visit by routing of this note to the PI.  All assessments and procedures were performed per above stated protocol. Subject tolerated injections well. Please refer to subject's paper source binder for further details of this visit.   Signed by Maria [name], CCRP, COT Clinical Research Coordinator  [facility]  [place], [place]

## 2024-05-09 ENCOUNTER — Ambulatory Visit: Admitting: Physician Assistant

## 2024-05-09 VITALS — BP 135/71 | HR 78

## 2024-05-09 DIAGNOSIS — N3946 Mixed incontinence: Secondary | ICD-10-CM

## 2024-05-09 NOTE — Progress Notes (Signed)
 PTNS  Session # 22  Health & Social Factors: no change Caffeine: 2 Alcohol: 0 Daytime voids #per day: 2-3 Night-time voids #per night: 2-3 Urgency: strong Incontinence Episodes #per day: 3-5 Ankle used: right Treatment Setting: 5 Feeling/ Response: sensory Comments: Patient tolerated well.  Performed By: Zylah Elsbernd, PA-C   Follow Up: 1 month

## 2024-05-10 ENCOUNTER — Other Ambulatory Visit: Payer: Self-pay | Admitting: Nurse Practitioner

## 2024-05-10 ENCOUNTER — Telehealth: Admitting: Nurse Practitioner

## 2024-05-10 ENCOUNTER — Encounter: Payer: Self-pay | Admitting: Nurse Practitioner

## 2024-05-10 VITALS — Wt 185.0 lb

## 2024-05-10 DIAGNOSIS — G894 Chronic pain syndrome: Secondary | ICD-10-CM

## 2024-05-10 DIAGNOSIS — G8929 Other chronic pain: Secondary | ICD-10-CM

## 2024-05-10 DIAGNOSIS — M5010 Cervical disc disorder with radiculopathy, unspecified cervical region: Secondary | ICD-10-CM

## 2024-05-10 DIAGNOSIS — F320 Major depressive disorder, single episode, mild: Secondary | ICD-10-CM | POA: Diagnosis not present

## 2024-05-10 DIAGNOSIS — Q761 Klippel-Feil syndrome: Secondary | ICD-10-CM

## 2024-05-10 NOTE — Progress Notes (Signed)
 Ph: [phone] Fax: [phone]   Patient ID: Maria [name], female    DOB: Jul 20, 1955, 68 y.o.   MRN: [id number]  Virtual visit completed through MyChart, a video enabled telemedicine application. Due to national recommendations of social distancing due to COVID-19, a virtual visit is felt to be most appropriate for this patient at this time. Reviewed limitations, risks, security and privacy concerns of performing a virtual visit and the availability of in person appointments. I also reviewed that there may be a patient responsible charge related to this service. The patient agreed to proceed.   Patient location: home Provider location: [place] at [facility], office Persons participating in this virtual visit: patient, provider   If any vitals were documented, they were collected by patient at home unless specified below.    Wt 185 lb (83.9 kg)   BMI 30.79 kg/m    CC: Anxiety and depression  Subjective:   HPI: Maria [name] is a 68 y.o. female presenting on 05/10/2024 for Medical Management of Chronic Issues (6 week f/u )    States that she was able to switch the medications nad felt like the swtich went well. States that her sleep is better. She is still having trouble going to sleep. State that when she goes to sleep better.  State that she si not having an HI/SI/AVH  She is going through a lot with testing as of late and has some external stressors. Overall seems to be doing well and mood is unaffected     05/10/2024   10:20 AM 02/27/2024   10:33 AM 06/30/2023    8:50 AM  PHQ9 SCORE ONLY  PHQ-9 Total Score 9 5  0      Data saved with a previous flowsheet row definition       03/01/2023    9:02 AM 11/30/2022    9:32 AM 09/01/2022    9:27 AM 04/18/2022   12:25 PM  GAD 7 : Generalized Anxiety Score  Nervous, Anxious, on Edge 1 2 1 1   Control/stop worrying 1 1 0 0  Worry too much - different things 1 2 1  0  Trouble relaxing 0 2 2 0  Restless 0 0 0 0  Easily  annoyed or irritable 0 2 0 0  Afraid - awful might happen 0 1  1  Total GAD 7 Score 3 10  2   Anxiety Difficulty Very difficult Somewhat difficult Somewhat difficult Somewhat difficult      Relevant past medical, surgical, family and social history reviewed and updated as indicated. Interim medical history since our last visit reviewed. Allergies and medications reviewed and updated. Outpatient Medications Prior to Visit  Medication Sig Dispense Refill   albuterol  (VENTOLIN  HFA) 108 (90 Base) MCG/ACT inhaler Inhale 2 puffs into the lungs every 6 (six) hours as needed for wheezing or shortness of breath. 8.5 each 5   ASPIRIN  LOW DOSE 81 MG EC tablet TAKE 1 TABLET BY MOUTH EVERY DAY 90 tablet 3   atorvastatin  (LIPITOR) 20 MG tablet Take 1 tablet (20 mg total) by mouth daily. 90 tablet 1   Azelastine  HCl 137 MCG/SPRAY SOLN USE 1 SPRAY INTO EACH NOSTRIL TWICE A DAY 30 mL 1   benzonatate  (TESSALON ) 200 MG capsule Take 1 capsule by mouth three times daily as needed 90 capsule 0   Budeson-Glycopyrrol-Formoterol  (BREZTRI  AEROSPHERE) 160-9-4.8 MCG/ACT AERO Inhale 2 puffs into the lungs in the morning and at bedtime. 3 each 3   clopidogrel  (PLAVIX ) 75 MG  tablet Take 1 tablet (75 mg total) by mouth daily. 90 tablet 3   gabapentin  (NEURONTIN ) 600 MG tablet Take 1 tablet (600 mg total) by mouth every 8 (eight) hours. 90 tablet 2   HYDROcodone  bit-homatropine (HYCODAN) 5-1.5 MG/5ML syrup Take 5 mLs by mouth 2 (two) times daily as needed for cough. 300 mL 0   hydrOXYzine  (VISTARIL ) 25 MG capsule TAKE 1 CAPSULE BY MOUTH AT BEDTIME AS NEEDED 90 capsule 0   ipratropium-albuterol  (DUONEB) 0.5-2.5 (3) MG/3ML SOLN USE 1 AMPULE IN NEBULIZER 4 TIMES DAILY AS NEEDED 360 mL 0   isosorbide  mononitrate (IMDUR ) 30 MG 24 hr tablet Take 1 tablet (30 mg total) by mouth every morning. 30 tablet 11   Multiple Vitamins-Minerals (MULTIVITAMIN WITH MINERALS) tablet Take 1 tablet by mouth daily. Alive Woman 50+     nitroGLYCERIN   (NITROSTAT ) 0.4 MG SL tablet Place 1 tablet (0.4 mg total) under the tongue every 5 (five) minutes as needed. 25 tablet 1   OVER THE COUNTER MEDICATION Place 1 drop into both eyes daily as needed (Dry and itching eyes). Thera Tears     oxybutynin  (DITROPAN -XL) 10 MG 24 hr tablet Take 1 tablet (10 mg total) by mouth daily. 90 tablet 3   OXYGEN  Inhale 3-5 L into the lungs continuous. 3-4 liters at rest and 5 liters with any activity.-     Respiratory Therapy Supplies (FLUTTER) DEVI Use as directed 1 each 0   roflumilast  (DALIRESP ) 500 MCG TABS tablet Take 1 tablet (500 mcg total) by mouth daily. 30 tablet 11   sertraline  (ZOLOFT ) 50 MG tablet Take 1 tablet by mouth once daily 90 tablet 0   sodium chloride  HYPERTONIC 3 % nebulizer solution Take by nebulization 2 (two) times daily. 750 mL 12   metoprolol  succinate (TOPROL -XL) 25 MG 24 hr tablet Take 1 tablet (25 mg total) by mouth every evening. Take with or immediately following a meal. 30 tablet 2   Facility-Administered Medications Prior to Visit  Medication Dose Route Frequency Provider Last Rate Last Myrna Rear - ARNASA [id number] (open-label) - astegolimab 238 mg/1.7 mL SQ injection (PI-Ramaswamy)  476 mg Subcutaneous Q14 Days    476 mg at 01/03/24 0955   Study - ARNASA [id number] (open-label) - astegolimab 238 mg/1.7 mL SQ injection (PI-Ramaswamy)  476 mg Subcutaneous Q14 Days    476 mg at 01/17/24 1006   Study - ARNASA [id number] (open-label) - astegolimab 238 mg/1.7 mL SQ injection (PI-Ramaswamy)  476 mg Subcutaneous Q14 Days    476 mg at 01/31/24 1141   Study - ARNASA [id number] (open-label) - astegolimab 238 mg/1.7 mL SQ injection (PI-Ramaswamy)  476 mg Subcutaneous Q14 Days    476 mg at 02/28/24 1002   Study - ARNASA [id number] (open-label) - astegolimab 238 mg/1.7 mL SQ injection (PI-Ramaswamy)  476 mg Subcutaneous Q14 Days    476 mg at 03/13/24 1002   Study - ARNASA [id number] (open-label) - astegolimab 238 mg/1.7 mL SQ injection (PI-Ramaswamy)  476 mg  Subcutaneous Q14 Days    476 mg at 03/27/24 1019   Study - ARNASA [id number] (open-label) - astegolimab 238 mg/1.7 mL SQ injection (PI-Ramaswamy)  476 mg Subcutaneous Q14 Days    476 mg at 04/10/24 0856   Study - ARNASA [id number] (open-label) - astegolimab 238 mg/1.7 mL SQ injection (PI-Ramaswamy)  476 mg Subcutaneous Q14 Days    476 mg at 04/24/24 0844   Study - ARNASA [id number] (open-label) - astegolimab 238 mg/1.7 mL SQ injection (PI-Ramaswamy)  476  mg Subcutaneous Q14 Days    476 mg at 05/08/24 0958   Study - ARNASA [id number] - astegolimab 238 mg/1.7 mL or placebo SQ injection (PI-Ramaswamy)  476 mg Subcutaneous Q14 Days        Study - ARNASA [id number] - astegolimab 238 mg/1.7 mL or placebo SQ injection (PI-Ramaswamy)  476 mg Subcutaneous Q14 Days    476 mg at 09/12/23 0930     Per HPI unless specifically indicated in ROS section below Review of Systems  Constitutional:  Negative for chills and fever.  Cardiovascular:  Negative for chest pain.  Psychiatric/Behavioral:  Positive for sleep disturbance. Negative for hallucinations. The patient is not nervous/anxious.    Objective:  Wt 185 lb (83.9 kg)   BMI 30.79 kg/m   Wt Readings from Last 3 Encounters:  05/10/24 185 lb (83.9 kg)  05/07/24 185 lb (83.9 kg)  04/22/24 169 lb (76.7 kg)       Physical exam: Gen: alert, NAD, not ill appearing Pulm: speaks in complete sentences without increased work of breathing Psych: normal mood, normal thought content      Results for orders placed or performed in visit on 05/07/24  IgE   Collection Time: 05/07/24  9:28 AM  Result Value Ref Range   IgE (Immunoglobulin E), Serum 262 (H) <OR=114 kU/L  CBC w/Diff   Collection Time: 05/07/24  9:28 AM  Result Value Ref Range   WBC 8.2 4.0 - 10.5 K/uL   RBC 3.67 (L) 3.87 - 5.11 Mil/uL   Hemoglobin 11.1 (L) 12.0 - 15.0 g/dL   HCT 66.2 (L) 63.9 - 53.9 %   MCV 91.8 78.0 - 100.0 fl   MCHC 33.1 30.0 - 36.0 g/dL   RDW 86.8 88.4 - 84.4 %   Platelets 300.0  150.0 - 400.0 K/uL   Neutrophils Relative % 60.7 43.0 - 77.0 %   Lymphocytes Relative 26.8 12.0 - 46.0 %   Monocytes Relative 6.9 3.0 - 12.0 %   Eosinophils Relative 4.8 0.0 - 5.0 %   Basophils Relative 0.8 0.0 - 3.0 %   Neutro Abs 5.0 1.4 - 7.7 K/uL   Lymphs Abs 2.2 0.7 - 4.0 K/uL   Monocytes Absolute 0.6 0.1 - 1.0 K/uL   Eosinophils Absolute 0.4 0.0 - 0.7 K/uL   Basophils Absolute 0.1 0.0 - 0.1 K/uL   Assessment & Plan:   Current mild episode of major depressive disorder without prior episode Assessment & Plan: Patient was previously on escitalopram  without great benefit.  We switch patient over to sertraline  50 mg daily.  Patient tolerated switch well.  No current side effects.  Patient states mood is doing well with improvement in sleep when she is able to get to sleep.  Patient denies HI/SI/AVH.  Will continue sertraline  50 mg daily.      I discussed the assessment and treatment plan with the patient. The patient was provided an opportunity to ask questions and all were answered. The patient agreed with the plan and demonstrated an understanding of the instructions. The patient was advised to call back or seek an in-person evaluation if the symptoms worsen or if the condition fails to improve as anticipated.  Follow up plan: Return if symptoms worsen or fail to improve, for as scheduled .  Adina Crandall, NP

## 2024-05-10 NOTE — Assessment & Plan Note (Signed)
 Patient was previously on escitalopram  without great benefit.  We switch patient over to sertraline  50 mg daily.  Patient tolerated switch well.  No current side effects.  Patient states mood is doing well with improvement in sleep when she is able to get to sleep.  Patient denies HI/SI/AVH.  Will continue sertraline  50 mg daily.

## 2024-05-10 NOTE — Patient Instructions (Signed)
 Nice to see you today We will continue the sertraline  as is Follow up with me as scheduled, sooner if you need me

## 2024-05-14 ENCOUNTER — Encounter: Payer: Self-pay | Admitting: Internal Medicine

## 2024-05-14 ENCOUNTER — Other Ambulatory Visit: Payer: Self-pay | Admitting: Internal Medicine

## 2024-05-15 ENCOUNTER — Telehealth: Payer: Self-pay

## 2024-05-15 ENCOUNTER — Other Ambulatory Visit (HOSPITAL_COMMUNITY): Payer: Self-pay

## 2024-05-15 ENCOUNTER — Ambulatory Visit: Payer: Self-pay | Admitting: Internal Medicine

## 2024-05-15 DIAGNOSIS — R053 Chronic cough: Secondary | ICD-10-CM

## 2024-05-15 DIAGNOSIS — R7689 Other specified abnormal immunological findings in serum: Secondary | ICD-10-CM

## 2024-05-15 NOTE — Telephone Encounter (Signed)
 My chart msg:  I was looking over the blood test you had ordered. There are some that look way out of wack. Could you explain these to me please.   Pt concerned with some of her labs being out of range.   Please advise

## 2024-05-15 NOTE — Telephone Encounter (Signed)
 Called patient.  Patient calling wanting refills on benzonatate  and gabapentin .  Please send refills into [facility] on [address] in [place].  Patient also calling wanting results on recent blood work from 05/07/2024.   Last OV 05/07/2024.  Please advise.

## 2024-05-15 NOTE — Telephone Encounter (Signed)
 Pt also requesting refill on gabapentin 

## 2024-05-16 NOTE — Telephone Encounter (Signed)
**Note De-identified  [place] Obfuscation** Please advise 

## 2024-05-17 ENCOUNTER — Other Ambulatory Visit: Payer: Self-pay

## 2024-05-17 MED ORDER — BENZONATATE 200 MG PO CAPS: 200.0000 mg | ORAL_CAPSULE | Freq: Three times a day (TID) | ORAL | 0 refills | Status: DC | PRN

## 2024-05-17 NOTE — Telephone Encounter (Signed)
 Done per Dr. Ramaswamy medications refilled, nothing farther needed.

## 2024-05-17 NOTE — Telephone Encounter (Signed)
 Okay to refill the medicines that she wants refilled.  The test results suggest that some allergic predisposition that might be going on.  This was not the case 4  years ago.  Plan - Come and do rest of allergy panel in order to understand more. - Okay to refill medications    Latest Reference Range & Units 11/18/19 13:26 05/07/24 09:28  IgE (Immunoglobulin E), Serum <OR=114 kU/L 97 262 (H)  (H): Data is abnormally high    Latest Reference Range & Units 10/02/08 22:05 11/09/16 22:06 02/27/19 03:45 02/28/19 01:15 03/01/19 03:22 03/02/19 01:55 03/03/19 01:54 03/19/19 09:56 11/18/19 13:26 10/20/21 09:53 03/31/22 10:19 07/05/22 11:43 05/01/23 08:55 05/10/23 16:40 03/29/24 12:00 05/07/24 09:28  Eosinophils Absolute 0.0 - 0.7 K/uL 0.1 0.2 0.0 0.0 0.0 0.0 0.0 0.1 0.2 0.1 0.1 0.1 0.1 0.1 0.2 0.4

## 2024-05-22 ENCOUNTER — Other Ambulatory Visit (HOSPITAL_COMMUNITY): Payer: Self-pay

## 2024-05-22 ENCOUNTER — Encounter: Payer: Self-pay | Admitting: Nurse Practitioner

## 2024-05-22 ENCOUNTER — Encounter

## 2024-05-22 ENCOUNTER — Encounter: Admitting: Internal Medicine

## 2024-05-22 DIAGNOSIS — Z006 Encounter for examination for normal comparison and control in clinical research program: Secondary | ICD-10-CM

## 2024-05-22 MED ORDER — CLOPIDOGREL BISULFATE 75 MG PO TABS
75.0000 mg | ORAL_TABLET | Freq: Every day | ORAL | 1 refills | Status: DC
  Filled 2024-06-04: qty 90, 90d supply, fill #0

## 2024-05-22 MED ORDER — STUDY - ARNASA GB44332 - ASTEGOLIMAB 238 MG/1.7 ML OR PLACEBO SQ INJECTION (PI-RAMASWAMY)
476.0000 mg | INJECTION | SUBCUTANEOUS | Status: DC
  Filled 2024-05-22: qty 3.4

## 2024-05-22 MED ORDER — BENZONATATE 200 MG PO CAPS
200.0000 mg | ORAL_CAPSULE | Freq: Three times a day (TID) | ORAL | 0 refills | Status: DC | PRN
  Filled 2024-06-16: qty 90, 30d supply, fill #0

## 2024-05-22 MED ORDER — IPRATROPIUM-ALBUTEROL 0.5-2.5 (3) MG/3ML IN SOLN
3.0000 mL | Freq: Four times a day (QID) | RESPIRATORY_TRACT | 0 refills | Status: AC | PRN
Start: 2023-11-10 — End: ?

## 2024-05-22 MED ORDER — ESCITALOPRAM OXALATE 20 MG PO TABS
20.0000 mg | ORAL_TABLET | Freq: Every day | ORAL | 1 refills | Status: DC
  Filled 2024-06-05: qty 90, 90d supply, fill #0

## 2024-05-22 MED ORDER — ALBUTEROL SULFATE HFA 108 (90 BASE) MCG/ACT IN AERS: 2.0000 | INHALATION_SPRAY | Freq: Four times a day (QID) | RESPIRATORY_TRACT | 3 refills | Status: AC | PRN

## 2024-05-22 NOTE — Telephone Encounter (Signed)
 Good morning Dr. Geronimo, The patient has reviewed her lab work on [place] and is requesting some explanation of the results.  Please advise.  Thank you.

## 2024-05-22 NOTE — Telephone Encounter (Signed)
 Maria [name],  See mychart request for gabapentin  pharmacy change

## 2024-05-22 NOTE — Research (Addendum)
 Title: A phase III Open-Label extension study to evaluate the long-term safety of Astegolimab in patients with Chronic Obstructive Pulmonary disease.   Dose and Duration of Treatment: Astegolimab is presented as a sterile, slightly brown-yellow solution. Each single-use, 2.25 mL pre-filled syringe contains 1.7 mL deliverable volume. Astegolimab drug product is formulated at 140 mg/mL astegolimab with 114 mM succinic acid, 200 mM L-arginine, 10 mM L-methionine, 0.06% (w/v) polysorbate 20, pH 5.7. Placebo for astegolimab is supplied in an identical pre-filled syringe configuration.   Protocol # [id number] Sponsor: [employer] [address], [place]   Protocol Version: v3 dated 15Oct2024 IB: version 9.0 dated April 2024 ICF:  ICFs: Make sure that the subject is re-consented with the updated ICFs Main ICF: [employer] IR[employer] Approved Version 15 Jun 2023, Revised 28Nov2024 Mobile Nursing: [employer] IR[employer] Approved Version 26Oct2023, Revised 08Aug2024 Optional Home Study Treatment: IRB Approved Version 12 May 2022 Revised 23 Feb 2023 ADDENDUM 1: IRB Approved Version 24Jul2024 Revised 8Aug2024 Lab Manual: V2.1.0 dated 21Jun2024   Mechanism of action: Astegolimab (also known as [id number] or [id number]) is a fully human, IgG2 monoclonal antibody that binds with high affinity to the interleukin (IL)-33 (IL-33) receptor, ST2, thereby blocking the signaling of IL-33, an inflammatory cytokine of the IL-1 family and member of the alarmin class of molecules. Astegolimab binds with high affinity to the human and cynomolgus monkey receptor for IL-33, ST2, and blocks IL-33 binding, thus inhibiting association with the IL-1R accessory protein (AcP) co-receptor and formation of an activated receptor complex.   Key Inclusion Criteria: Able and willing to provide written informed consent and to comply with the study protocol. Ability to comply with the requirements of study protocol, according  to the investigators's best judgement. Completion of the 52-week treatment period in either [place] or Study HA55667.   Key Exclusion Criteria: Significant non- compliance in the parent study, specifically defined as missing scheduled visits, per investigators judgement. Any new clinically significant pulmonary disease other than COPD since enrolling in the study. Unstable cardiac disease, myocardial infarction, or [place]  Heart Association Class III or IV heart failure since enrolling in the parent study.   Integrated Pharmacokinetic/Pharmacodynamic Analysis:  In Studies HA60757, K3610841, and [id number], exploratory biomarker analysis showed there were consistent decreases in blood eosinophil counts throughout the treatment period, potentially mediated by a direct effect of IL-33 on eosinophil progenitors. In [place], there was no significant difference in fractional exhaled nitric oxide (FeNO) levels (reflecting airway IL-4/IL-13 activity) between astegolimab-treated groups relative to placebo throughout the treatment period. These data suggest that astegolimab has only a limited effect on Type 2 inflammation in asthma. No data are applicable for Study HJ57530. No data are available yet for Study HA56688.   Special Warnings/Considerations: Administration of astegolimab, a protein therapeutic, may lead to the development of anti-astegolimab antibodies which could lead to AEs and/or decreased exposure. In non-clinical studies (see Section 4.2), ADA incidence has generally been low and there was no apparent ADA impact on PK and safety in these studies. To date, the immunogenicity rates observed with astegolimab in clinical studies have been relatively low as well (see Immunogenicity Section 5.6). Several clinical studies have been conducted in patients with asthma, atopic dermatitis, COPD (IIS Study HA59431) and COVID-19 severe pneumonia, which generally showed low incidence of ADAs (Table 31).  There has been no correlation between ADA status and clinical findings or increased incidence of AEs. Astegolimab is now being considered in a larger study for the treatment of  COPD. This patient population is typically considered to have a hyper-responsive immune system. Route of administration for this molecule will be [place], either Q2W or Q4W. These factors increase the risk of development of an immune response to astegolimab, specifically with repeat dosing. From the previous IIS Study HA59431, incidence of ADAs was low in COPD patients; this remains to be confirmed in a larger study. To monitor ADA development in ongoing studies, serum samples will be collected from patients at protocol-defined intervals. Patients who test positive for antibodies and have clinical sequelae that are considered potentially related to an ADA response may also be asked to return for additional follow-up testing.   Drug Interaction Studies: No PK drug interaction studies have been conducted to date.   Serious Adverse Reactions Observed in Asthma Study: During the treatment period, a similar proportion of patients across all cohorts experienced at least one AE, regardless of causality (Table 22). In total, 77.2%, 70.9%, 72.2%, and 72.1% of patients reported at least 1 AE in the placebo, 70 mg, 210 mg, and 490 mg groups, respectively. The most common AE's (>5% in any treatment group) were asthma, nasopharyngitis, upper respiratory tract infection, headache, and injection site reaction (ISR). The most common drug-related AE was ISR, which was reported more frequently in the astegolimab treatment groups than in the placebo group (1 [0.8%] patient in the placebo group, 10 [7.9%] patients with 70 mg, 8 [6.3%] patients with 210 mg, 6 [4.9%] patients with 490 mg). All ISRs were non-serious and mild or moderate in severity. During the treatment period, 50 SAEs were reported in 37 (7.4%) patients. The number of patients reporting SAEs was  comparable across all cohorts (11 SAEs in 8 patients on placebo, 21 SAEs in 14 patients on 70 mg, 11 SAEs in 9 patients on 210 mg, and 7 SAEs in 6 patients on 490 mg). The most common SAE was asthma. One SAE of moderate livedo reticularis (70 mg) was considered a suspected unexpected serious adverse drug reaction (SUSAR) related to astegolimab and was reported two days after the second dose, leading to discontinuation of astegolimab. Two (0.4%) patients reported anaphylaxis and hypersensitivity reactions: 1 severe SAE of anaphylactic reaction (placebo), and 1 moderate hypersensitivity (490 mg) considered related to astegolimab. Three (0.6%) patients experienced a potential Major Adverse Cardiac Event (MACE) (1 patient each in the placebo, 70 mg, and 210 mg groups). None of the potential MACE were considered related to astegolimab. Overall, 233 (46.4%) patients reported events of infection. A comparable number of patients reported infection across all treatment groups (65 [51.2%] patients on placebo, 55 [43.3%] patients on 70 mg, 58 [46.0%] patients on 210 mg, and 55 [45.1%] patients on 490 mg). The most frequently reported infection (>=10% incidence) was nasopharyngitis (12.7%). One patient on astegolimab 210 mg and the partner of one patient on placebo became pregnant during the study. Both delivered normal/healthy babies. Two deaths, unrelated to study drug, were reported: one patient on 210 mg astegolimab died following an SAE of asthma; the other patient on 490 mg astegolimab had an unexplained death. There were no clinically meaningful changes in laboratory parameters, vital signs, or ECG results, other than the 10% decrease in mean blood eosinophil counts in the astegolimab-treated groups, with no safety concerns. Treatment-induced ADAs were comparable between the astegolimab groups and had no impact on safety. Overall, astegolimab was well tolerated at all doses used and had a safety profile consistent with  that observed in the previous astegolimab Phase I studies.   Serious  Adverse Reactions Observed in Previous COPD Study: In the completed IIS Study HA59431, a total of 81 patients received at least one dose of astegolimab or placebo. The safety profile of astegolimab was similar to that of placebo. There were a total of 222 AEs reported in 62 patients. A total of 28 (72%) patients in the placebo arm and 34 (81%) patients in the astegolimab arm reported at least one AE. The most commonly reported AEs were headache followed by urinary tract infection and viral upper respiratory tract infection. A total of 39 SAEs were reported in 28 (35%) patients; 16 (41%) patients in the placebo group and 12 (29%) in the astegolimab group. The most commonly reported SAE was hospital admission for community acquired pneumonia. Four SAEs resulted in patient discontinuation from treatment (1 patient in the placebo group and 3 patients in the astegolimab group). One patient in the placebo group experienced an AESI of potential MACE (heart failure, unrelated to the study treatment). No anaphylaxis or pregnancies were reported. Two deaths, unrelated to study treatment, were reported: 1 patient in the placebo group died after hospital acquired pneumonia, and another patient in the placebo group died after pneumonia and type 2 respiratory failure.   Safety Data: Astegolimab has been generally well tolerated. There have been 49 patient deaths across all astegolimab studies (Section 5.5.2), none of which were considered related to astegolimab. A total of 144 subjects experienced a total of 224 SAEs across all astegolimab studies. Of these, an SAE livedo reticularis observed in 1 patient was considered related to astegolimab by the investigator (Section 5.5.3). AEs leading to withdrawal (Section 5.5.4) have generally occurred at a rate similar to what would be expected for clinical trials in the studies' respective indications.   [facility]  @ [place] Clinical Research Coordinator note:    This visit for Subject: [id number] DOB: February 09, 1956 on 05Nov2025 for the above protocol is Visit/Encounter # week 28 and is for purpose of research.    Subject expressed continued interest and consent in continuing as a study subject. Subject confirmed that there was no change in contact information (e.g. address, telephone, email). Subject thanked for participation in research and contribution to science. The Subject was informed that the PI Dr. Geronimo continues to have oversight of the subject's visits and course through relevant discussions, reviews and specifically of this visit by routing of this note to the PI.  All assessments and procedures were performed per above stated protocol. Subject tolerated injections well. Please refer to subject's paper source binder for further details of this visit.   Signed by Octaviano Paras, CCRP, COT Clinical Research Coordinator  [facility]  [place], [place]

## 2024-05-23 ENCOUNTER — Other Ambulatory Visit (HOSPITAL_COMMUNITY): Payer: Self-pay

## 2024-05-23 ENCOUNTER — Other Ambulatory Visit: Payer: Self-pay | Admitting: Nurse Practitioner

## 2024-05-23 DIAGNOSIS — G894 Chronic pain syndrome: Secondary | ICD-10-CM

## 2024-05-23 DIAGNOSIS — Q761 Klippel-Feil syndrome: Secondary | ICD-10-CM

## 2024-05-23 DIAGNOSIS — M5010 Cervical disc disorder with radiculopathy, unspecified cervical region: Secondary | ICD-10-CM

## 2024-05-23 DIAGNOSIS — G8929 Other chronic pain: Secondary | ICD-10-CM

## 2024-05-23 MED ORDER — GABAPENTIN 600 MG PO TABS
600.0000 mg | ORAL_TABLET | Freq: Three times a day (TID) | ORAL | 0 refills | Status: DC
  Filled 2024-05-23: qty 90, 30d supply, fill #0

## 2024-05-23 NOTE — Telephone Encounter (Signed)
 Thank you  I assume your staff will reach out and get her scheduled

## 2024-05-24 ENCOUNTER — Other Ambulatory Visit (HOSPITAL_COMMUNITY): Payer: Self-pay

## 2024-05-24 ENCOUNTER — Other Ambulatory Visit: Payer: Self-pay | Admitting: Primary Care

## 2024-05-24 NOTE — Telephone Encounter (Signed)
 Screening test for allergy is positive.  Plan - Given standard of care visit with me first available to discuss.  If she wants this treated we have to discuss options because there are a lot of restrictions while on the study drug and we will have to see which 1 is a better fit for her based on individual decision.

## 2024-05-28 ENCOUNTER — Other Ambulatory Visit (HOSPITAL_COMMUNITY): Payer: Self-pay

## 2024-05-31 NOTE — Telephone Encounter (Signed)
 I called and spoke to pt. Pt informed of Dr Reeves note and verbalized understanding. Pt has been scheduled for Monday, 05-03-24 with Dr Geronimo. NFN

## 2024-06-02 NOTE — Progress Notes (Unsigned)
 Subjective:  Patient ID: Maria [name], female , DOB: 1956/04/28 , age 68 y.o. , MRN: [id number] , ADDRESS: [address] [place] [address]   03/19/2019 -   Chief Complaint  Patient presents with   Hospitalization Follow-up    Post covid symptoms   Post COVID  follow-up in the pulmonary office  HPI Maria [name] 68 y.o. 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  And also poor intake.  Was diagnosed to have acute COVID-19.  She remembers being on oxygen .  At the time prior to admission she is had chronic hip pain based on her history and review of the records for which she was using a cane.  She spent a total of 5 days at [facility] the COVID hospital and was discharged on March 03, 2019.  She was treated with oxygen , Solu-Medrol  and REM does Advair.  Last set of laboratories reviewed on March 03, 2019 which showed that she had normal hemoglobin and creatinine and his CRP had improved.  The only chest x-ray that was available is 1 on admission in February 26, 2019 but she has some basal infiltrates.  She now presents to the pulmonary clinic on March 19, 2019.  It is 21 days since admission and she is over 21 days since her first set of symptoms.  She tells me that she is a lot more fatigue.  She is so fatigued that she is not even able to take her inhaler properly which is Trelegy for her COPD.  She feels short of breath all the time.  She is requiring a lot of help taking a shower.  Her hip pain continues.  She is now needing the use of a walker.  Her ECOG is 4.  Her appetite is good.  She is currently not using oxygen  either at night or in the daytime.   Results for Maria, Maria [name] (MRN [id number]) as of  08/22/2019 12:57  Ref. Range 03/01/2019 03:22 03/02/2019 01:55 03/03/2019 01:54  D-Dimer, Earleen Latest Ref Range: 0.00 - 0.50 ug/mL-FEU 1.34 (H) 1.12 (H) 1.28 (H)       04/03/2019 Presents today for a 2-week follow-up.  Echocardiogram showed normal systolic function with estimated EF 60 to 65%.  Left ventricle diastolic doppler consistent with impaired relaxation-determinant filling pressures.  Right ventricle with normal systolic function, increased wall thickness-systolic pressure could not be assessed.  HRCT is scheduled for 9/29.  Patient called on 9/4 asking about portable oxygen  tank order.  Reports that her O2 level has read 84 to 85% after exertion with a heart rate of 110.  She reports that her oxygen  level recovers greater than 90% after resting.  Is not currently on oxygen  at home will need to be walked in office to qualify for POC. Repeat COVID testing was positive, patient reports that she still has lost her taste and smell with increased shortness of breath.   She is feeling a little better today. Family is checking on her. No other help at home. Reports feeling fine when sitting but her O2 drops as low as 85% on exertion. She is able to recover with rest. Taking prednisone  taper as prescribed, feels it has helped her chest tightness. She has  dry/hacking cough with no mucus production. She is eating and drinking ok. Denies fever.   04/09/2019 Patient presents today for 1 week follow-up. Unfortunately we were unable to get her set up with home health d/t her insurance. She is here today for qualifying O2 walk. She continues to have some shortness of breath and dry cough. Low energy. Her taste and smell as returned some. She is drinking plenty of fluids. Reports that her cough is not quite as bad, states the more she talks she has to cough. Has trouble sleeping at night d/t her respiratory symptoms. Feels delsym and tessalon  perles have been helpful. ONO on RA showed O2 low 70% with baseline 87%.  Ambulatory O2 today showed O2 desaturation 84% on RA, requiring 3L. Using [employer] for nebulizer supplies. Denies fever, weight loss, hemoptysis, purulent mucus.   04/22/2019 Patient presents today for 2 week follow-up. Breathing is the same. Continues to have a dry cough. Completed prednisone  2 days ago. Using incentive spirometry. She has tried to go without her oxygen  at home but states that she gets too short winded. 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 Diffuse bronchial wall thickening with moderate centrilobular and paraseptal emphysema; imaging findings suggestive of underlying COPD. She had COPD symptoms before getting COVID. Her breathing was getting a little worse before her diagnosis.  Sleeping better at night with oxygen . Ambulating with rolling walker, still deconditioned/weak. Due for influenza vaccine today.   Oxygen  readings:  Resting O2 93% on RA  Ambulatory O2 98-100 on 3L Ambulatory O2 85% on RA  OV 07/03/2019  Subjective:  Patient ID: Maria [name], female , DOB: Dec 03, 1955 , age 21 y.o. , MRN: [id number] , ADDRESS: [address] [place] [address]   07/03/2019 -   Chief Complaint  Patient presents with   Follow-up    PFT performed 12/7 and pt is here following up after that. Pt states she is still becoming SOB with exertion.  Follow-up emphysema Follow-up interstitial lung disease post COVID-19 in August 2020 (had elevatd d-dimer) Follow-up chronic hypoxemic respiratory failure secondary to both   HPI Maria [name] 68 y.o. -presents for the above issues.  She says her condition is returned to normal.  She is back working as a equities trader.  However she continues to be still significantly symptomatic.  She had a pulmonary function test June 24, 2019.  I visualized the result.  Shows isolated reduction in diffusion capacity  was only moderate at 57%.  FVC and FEV1 are normal.  She continues to be significantly symptomatic as documented below.  She is taking Trelegy.  She says she desaturates to 85% with exertion echocardiogram recently was normal.  She is asking if she should take the Moderna COVID-19 vaccine available in January 2021         IMPRESSION: CT chest 04/16/2019 1. 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 Given the presence of some bronchiectasis, this is technically characterized as probable UIP per current ATS guidelines, however, that is not favored. If there is persistent clinical concern for interstitial lung disease, repeat high-resolution chest CT would be recommended in 12 months to assess for temporal changes in the appearance of the lung parenchyma. 2. Diffuse bronchial wall thickening with moderate centrilobular and  paraseptal emphysema; imaging findings suggestive of underlying COPD. 3. Aortic atherosclerosis, in addition to right coronary artery disease. Please note that although the presence of coronary artery calcium  documents the presence of coronary artery disease, the severity of this disease and any potential stenosis cannot be assessed on this non-gated CT examination. Assessment for potential risk factor modification, dietary therapy or pharmacologic therapy may be warranted, if clinically indicated.   Aortic Atherosclerosis (ICD10-I70.0) and Emphysema (ICD10-J43.9).     Electronically Signed   By: Toribio Aye M.D.   On: 04/16/2019 13:12   OV 08/22/2019  Subjective:  Patient ID: Maria [name], female , DOB: [birth year] , age 63 y.o. , MRN: [id number] , ADDRESS: [address] [place] [address]   08/22/2019 -   Chief Complaint  Patient presents with   Follow-up    History of 2019 Novel coronavirus disease (COVID-19)    Follow-up emphysema - alpha 1 MZ in dec 2020 Follow-up interstitial lung disease post COVID-19 in August 2020 Follow-up chronic hypoxemic respiratory failure secondary to both Ex smoker Echo sept 202 0 - ef 65% and diast dysfn  HPI Maria [name] 68 y.o. -presents for follow-up of the above issues.  In the interim she did have serologic work-up for her ILD that was discovered post Covid setting.  Her ANA and rheumatoid factor were trace positive.  Her ESR was 41.  Therefore we committed her to few to several week prednisone  taper.  She says she is on a 5-week taper.  She is got 2 more weeks of this.  She says since her last visit she is feeling worse.  The worsening is in terms of shortness of breath.  Also has dizziness and floaters when she exerts.  In fact on a virtual rehab class she had to get on more oxygen .  Her dyspnea symptom scores are worse than before but cough and fatigue around the same.  She is requiring 3 L now with exertion.  Although in rehab the asked her to increase it to 6 L which helped but this was not documented because of worsening hypoxemia.  Today when we walked her she had to stop several times because of dizziness and fatigue although she did not desaturate.  Her lung function itself in December 2020 shows only isolated reduction in DLCO.   Of note she is alpha-1 MZ from her labs at last visit.    OV 04/29/2020   Subjective:  Patient ID: Maria [name], female , DOB: [birth year], age 71 y.o. years. , MRN: [id number],  ADDRESS: [address] [place] [address] PCP  Velma Harlene SAUNDERS, MD Providers : Treatment Team:  Attending Provider: Geronimo Amel, MD Patient Care Team: Velma Harlene SAUNDERS, MD as PCP - General (Family Medicine)   Follow-up emphysema - alpha 1 MZ in dec 2020 Follow-up interstitial lung disease post COVID-19 in August 2020 -> feb 2021 Case conf ILD: NO ILD Follow-up chronic hypoxemic respiratory failure secondary to both Ex smoker Echo sept  202 0 - ef 65% and diast dysfn Last CT scan of the chest February 2021  Chief Complaint  Patient presents with   Follow-up    Pt states that she has had good days and bad days since last visit. Pt said that she is still having problems with SOB also has an occ cough.       HPI Maria [name] 68 y.o. -returns for follow-up.  Last seen by myself in February 2021.  After that she  is seen nurse practitioner.  Alpha-1 replacement was advised by nurse practitioner.  Initially denied by insurance but now apparently approved by insurance.  The still waiting for some of paperwork to be clear.  Currently on Trelegy.  She is on oxygen  3 L at rest 5 L with exertion.  She is fully vaccinated.  She is in need of Covid booster.  Overall doing well.  No flowsheet data found.        Simple office walk 185 feet x  3 laps goal with forehead probe 08/22/2019   O2 used 3L  Number laps completed Did only 2 of 3. Stopped 8 times and site down x 2. Got dizzy and floaters in eyes  Comments about pace Very slow pace  Resting Pulse Ox/HR 99% and 91/min  Final Pulse Ox/HR 99% during entire walk  Desaturated </= 88% x  Desaturated <= 3% points x  Got Tachycardic >/= 90/min yes  Symptoms at end of test As above  Miscellaneous comments Sub maximal exertion     Las\bs - alph 1 MZ in dec 2020 - autoimmune  - ANA 1:40, RF 23, and ESR 40s   PFT dec 2020  - isolated reduction in dlco 57%   OV 08/27/2020  Subjective:  Patient ID: Maria [name], female , DOB: 03-16-1956 , age 68 y.o. , MRN: [id number] , ADDRESS: [address] [place] [address] PCP Velma Harlene SAUNDERS, MD Patient Care Team: Velma Harlene SAUNDERS, MD as PCP - General (Family Medicine)  This Provider for this visit: Treatment Team:  Attending Provider: Geronimo Amel, MD    08/27/2020 -   Chief Complaint  Patient presents with   Follow-up    Pt states she still has problems with SOB. States when the weather is colder her breathing  is worse. Pt also has complaints of cough which happens all throughout the day.    Follow-up emphysema - alpha 1 MZ in dec 2020 Follow-up interstitial lung disease post COVID-19 in August 2020 -> feb 2021 Case conf ILD: NO ILD Follow-up chronic hypoxemic respiratory failure secondary to both Ex smoker 40 pack quit Echo sept 202 0 - ef 65% and diast dysfn Last CT scan of the chest February 2021  HPI Maria [name] 68 y.o. -returns to see me for the above issues.  Since last visit she has had a Covid vaccine booster.  Overall she is stable but for the last few months has had worsening cough.  The cough is dry.  She continues with oxygen  3-5 L.  We will try to get her alpha-1 replacement but she got denied by insurance.  She showed me the insurance denial letters on August 10, 2020.  Apparently there was a hearing which she was asked to participate but they sent the note is late and on the same day they denied her appeal.  She feels this is all premeditated by the insurance company.  She is frustrated.  She continues with Trelegy and oxygen .  Review of the records indicate last CT scan of the chest was in February 2021.  She quit smoking less than 15 years ago.  She she is in need of annual CT scan now.  Other than the dry worsening cough she feels well.  There is no worsening wheezing or sputum.   CT Chest data  No results found.       OV 11/13/2020  Subjective:  Patient ID: Maria [name], female , DOB: 06-14-1956 , age 61 y.o. ,  MRN: [id number] , ADDRESS: [address] [place]  [address] PCP Velma Harlene SAUNDERS, MD Patient Care Team: Velma Harlene SAUNDERS, MD as PCP - General (Family Medicine)  This Provider for this visit: Treatment Team:  Attending Provider: Geronimo Amel, MD    11/13/2020 -   Chief Complaint  Patient presents with   Follow-up    PFT performed 4/28.  Pt states her cough is worse since last visit and states she still becomes SOB easily.   Follow-up emphysema -  alpha 1 MZ in dec 2020  -- On Trelegy for few years as of 2022.  Dose increased early 2022  -Insurance denied alpha-1 replacement in 2020 07/2020 Follow-up interstitial lung disease post COVID-19 in August 2020 -> feb 2021 Case conf ILD: NO ILD Follow-up chronic hypoxemic respiratory failure secondary to both Ex smoker 40 pack quit Echo sept 202 0 - ef 65% and diast dysfn Last CT scan of the chest February 2021  Chronic coough since jan 2022  HPI Maria [name] 68 y.o. -returns for work-up.  I last saw her in February 2022.  At that time she was reporting worsening cough.  We decided to full work-up.  She had pulmonary function test that shows stability since 2020 although the DLCO is down [?  Technical issues, last hemoglobin 12.1 g% in 2021 May] her oxygen  use is the same around 3 L at rest 5 L with exertion.  She uses Trelegy.  She is not on any antihypertensives.  Not on any acid reflux medication.  Not on fish oil.  Her smoking is in remission.  The cough wakes her up at night.  Cough severity is documented below.  It significantly high suggesting irritable larynx syndrome/cough neuropathy.  She had CT scan of the chest that only shows emphysema.  No fibrosis or pneumonia or nodules or cancer.  She had an arterial blood gas and there is no hypercapnia.  She is frustrated by her quality of life with excessive cough.  The cough is driving most of the high COPD CAT score of 35.    Dr Felice Reflux Symptom Index (> 13-15 suggestive of LPR cough) 0 -> 5  =  none ->severe problem 11/13/2020   Hoarseness of problem with voice 1  Clearing  Of Throat 1  Excess throat mucus or feeling of post nasal drip 2  Difficulty swallowing food, liquid or tablets 0  Cough after eating or lying down 4.5  Breathing difficulties or choking episodes 3.5  Troublesome or annoying cough 4  Sensation of something sticking in throat or lump in throat 3  Heartburn, chest pain, indigestion, or stomach acid coming up  0  TOTAL 19    CAT Score 11/13/2020  Total CAT Score 35         ABG Feb 2022  Results for CACI, ORREN (MRN [id number]) as of 11/13/2020 09:07  Ref. Range 08/31/2020 13:35  Sample type Unknown ARTERIAL DRAW  FIO2 Unknown 32.00  pH, Arterial Latest Ref Range: 7.350 - 7.450  7.417  pCO2 arterial Latest Ref Range: 32.0 - 48.0 mmHg 37.3  pO2, Arterial Latest Ref Range: 83.0 - 108.0 mmHg 87.3   CT Chest data feb 2022   IMPRESSION: 1.  No acute process in the chest. 2. Moderate centrilobular and paraseptal emphysema. No other explanation for patient's symptoms. Emphysema (ICD10-J43.9). 3.  Aortic Atherosclerosis (ICD10-I70.0).     Electronically Signed   By: Rockey Kilts M.D.   On: 09/04/2020 09:01  12/11/2020 Follow up : COPD with emphysema, oxygen  dependent respiratory failure, chronic cough Patient returns for a 1 month follow-up.  Patient has underlying moderate COPD with emphysema.  She also has alpha-1 phenotype MZ with a normal level. She is maintained on oxygen  3 L at rest and 5 L with activity. Patient has been dealing with an ongoing chronic cough since COVID-19 infection in August  2020.  Cough is persistent and feels getting progressively worse. Cough is very frustrating with cough associated urinary incontinence .  High resolution CT chest in 2021 and repeat CT chest February 2022 showed no evidence of interstitial lung disease.  Moderate emphysema. Patient has been recommended on cough control regimen.  Last visit she was started on gabapentin  300 mg twice daily.  She was also changed from Trelegy to BREZTRI  in case the dry powder inhaler was aggravating her upper airway Patient says her cough is no better , unchanged .  Using Halls cough drops.  Has been tried on steroid tapers in the past without any improvement.  Is not using any cough syrups.  Denies any fever, discolored mucus, hemoptysis, chest pain cough is all through the day and night . Sometimes  worse at night     OV 01/19/2021  Subjective:  Patient ID: Maria [name], female , DOB: 13-Jun-1956 , age 37 y.o. , MRN: [id number] , ADDRESS: [address] [facility] [id number] PCP Velma Harlene SAUNDERS, MD Patient Care Team: Velma Harlene SAUNDERS, MD as PCP - General (Family Medicine)  This Provider for this visit: Treatment Team:  Attending Provider: Geronimo Amel, MD  Follow-up emphysema - alpha 1 MZ in dec 2020  -- On Trelegy for few years as of 2022.  Dose increased early 2022  -Insurance denied alpha-1 replacement in 2020 07/2020 Follow-up interstitial lung disease post COVID-19 in August 2020 -> feb 2021 Case conf ILD: NO ILD Follow-up chronic hypoxemic respiratory failure secondary to both Ex smoker 40 pack quit Echo sept 202 0 - ef 65% and diast dysfn Last CT scan of the chest February 2022  Chronic coough since jan 2022  01/19/2021 -   Chief Complaint  Patient presents with   Follow-up    Pt states she still has complaints of coughing which is about the same since last visit. States she is coughing up clear thick phlegm. States SOB is worse when she goes outside in the humidity but is fine if she stays inside.     HPI Maria [name] 68 y.o. -returns for follow-up of her COPD and severe chronic cough.  She continues to use 3 L of oxygen .  She says without oxygen  at rest she starts getting dizzy.  However after return the oxygen  off her pulse ox was 92% on room air at rest 10 minutes later.  Overall she feels she is stable but she continues to have significant amount of cough that she rates as moderate.  Wakes her up at night.  There is no associated wheezing.  She does clear her throat and has a laryngeal quality to the cough.  Last visit nurse practitioner added Chlor-Trimeton and Tessalon  and Robitussin but despite this cough persist.  She continues on gabapentin  3 mg twice daily.  She has never been to voice rehabilitation.  She is not sure if prednisone  has helped her cough  but she knows prednisone  makes her irritable but she is willing to give it a short-term empiric trial    CAT Score 12/11/2020 11/13/2020  Total CAT  Score 18 35     IMPRESSION: 1.  No acute process in the chest. 2. Moderate centrilobular and paraseptal emphysema. No other explanation for patient's symptoms. Emphysema (ICD10-J43.9). 3.  Aortic Atherosclerosis (ICD10-I70.0).     Electronically Signed   By: Rockey Kilts M.D.   On: 09/04/2020 09:18 Jul 2021   07/28/2021 Follow up : COPD with emphysema, oxygen  dependent respiratory failure, chronic cough Patient returns for 46-month follow-up.  Patient remains on Breztri  inhaler twice daily.  She continues to have ongoing chronic cough despite aggressive cough control regimen.  She is on gabapentin  300 mg 3 times daily, Robitussin DM every 4 hours, Tessalon  Perles and Chlor-Trimeton.  Along with reflux diet and PPI. Ran out of Duoneb 2 weeks ago. Typically uses Twice daily . Notices difference in breathing without it.  She remains on oxygen  3 L at rest and 5 L with activity.Uses POC when out from home. Really helps.  Overall feels she is doing some better. Still has cough but not quite is bad. Does have some tightness in chest and back on right side at times. No chest pain, palpitations,  Flu shot is utd. PVX is utd.   Lives at home with husband. Drives. Brother has COPD on O2 . Husband works fulltime Adult public affairs consultant. 6 grandkids.  Stays active, light housework, shopping.   Patient's insurance will not cover Protonix  20 mg twice daily.  Will only cover Protonix  40 mg.  We discussed changing her prescription over She denies any increased reflux symptoms  OV 10/20/2021  Subjective:  Patient ID: Maria [name], female , DOB: 01/29/1956 , age 56 y.o. , MRN: [id number] , ADDRESS: [address] [place] [facility] [id number] PCP Velma Harlene SAUNDERS, MD Patient Care Team: Velma Harlene SAUNDERS, MD as PCP - General (Family Medicine)  This Provider for this  visit: Treatment Team:  Attending Provider: Geronimo Amel, MD    10/20/2021 -   Chief Complaint  Patient presents with   Follow-up    2 mo f/u for COPD. Wants to switch from [employer] to [employer] for her O2. 3L at rest, 5L w/exertion.    Follow-up emphysema - alpha 1 MZ in dec 2020  -- On Trelegy for few years as of 2022.  Dose increased early 2022  -Insurance denied alpha-1 replacement in 2020 07/2020 Follow-up interstitial lung disease post COVID-19 in August 2020 -> feb 2021 Case conf ILD: NO ILD Follow-up chronic hypoxemic respiratory failure secondary to both Ex smoker 40 pack quit Echo sept 202 0 - ef 65% and diast dysfn Last CT scan of the chest February 2022  Chronic coough since jan 2022 -on gabapentin    HPI Maria [name] 68 y.o. -returns for follow-up.  Her COPD CAT score is actually better than previous 2022 but similar to May 2022.  Nevertheless she is on 3 L oxygen  at rest and 5 L with exertion.  She states in the last 6 months has been on prednisone  quite a few times for COPD exacerbation and antibiotics at least 2 times.  But currently she is feeling stable.  She does have chronic cough for which she is on a bunch of medications.  She wants to qualify for oxygen .  She is having insurance coverage issues.  She is waiting till she turns 61 in August 2023 before she can enroll in Medicare.  She is interested in joining the [facility] insurance program with Medicare.  Her last CT scan of the chest was a  year ago.  We discussed the prevention of COPD exacerbations.  We discussed Roflumilast .  She is interested in this.  We also discussed the potential insurance approval with Dupixent.  She is willing to get a CBC with differential checked.  We discussed upcoming potential clinical trials such as the one being sponsored by [employer] which might be available in the future.  She is interested in that if she qualifies.  At this point in time she is okay starting  Daliresp .  She was sitting on 3 L oxygen .  I turned this off to get a qualifying walk test done.       07/28/2021   10:24 AM 12/11/2020    9:24 AM 11/13/2020    8:49 AM  CAT Score  Total CAT Score 19 18 35     12/15/2021 Follow up : COPD with emphysema, oxygen  dependent respiratory failure Patient presents for a 76-month follow-up and preop surgical risk assessment.  Patient has underlying moderate COPD with emphysema.  She is oxygen  dependent she says overall she has been doing well with her breathing.  She still gets short of breath with minimal activities.  Has had no flare of her breathing recently.  No recent antibiotics or steroid use.  Patient was seen early last month.  She was set up for an ABG that showed no significant hypercarbia.  She has been set up for CT chest that is pending, routine as she has a heavy smoking history.  Also Daliresp  was added into her maintenance regimen.  She remains on Breztri  twice daily. Spirometry today in the office shows stable lung function with FEV1 at 66%, ratio 87, FVC 59%.  Going to have neck surgery [facility] in [place]. Neurosurgeon, Dr. Cleotilde . Has degenerative cervical spinal stenosis. Has chronic neck pain.  Has hard time dressing due to neck and left arm pain. Becoming hard to use left arm due to weakness.   Lives at home with spouse. Able to drive. Can do very light house chores and cooking . Can only walk short distances and has to rest. Gets winded easily .   Remains on Oxygen  3l/m at rest and 5l/m with activity .  She denies any increased oxygen  demands.    Was seen 02/15/22 by PCP with aecopd rx zpak and pred > no better  Dyspnea:  room to room but comfortable at rest / no cp  Cough: yellowish worse than usual x 02/12/22 very congested sounding / assoc nasal congestion  SABA use: nebulizer not helping   baseline using 3 x daily  /  5-6 x daily since onset of flare 02 turned up to 4lpm POC sats low 90s   Rx  prednisone   OV 03/31/2022  Subjective:  Patient ID: Maria [name], female , DOB: 08-09-1955 , age 48 y.o. , MRN: [id number] , ADDRESS: [address] [place] [facility] [id number] PCP Velma Harlene SAUNDERS, MD Patient Care Team: Velma Harlene SAUNDERS, MD as PCP - General (Family Medicine)  This Provider for this visit: Treatment Team:  Attending Provider: Geronimo Amel, MD  03/31/2022 -   Chief Complaint  Patient presents with   Follow-up    Pt states that she is still having problems with coughing. States sometimes when she coughs it is clear but will also have a yellow tint to it. Also has some wheezing associated.     HPI Maria [name] 68 y.o. -mabelene entry] she was seen early August for COPD exacerbation.  She is alpha-1  MZ.  She has quit smoking but still she is not feeling better.  She is having coughing occasional sputum and wheezing.  Symptoms seem worse than baseline and consistent with a COPD flareup.  She is frustrated with repeated flareups.  In 2020 when she was able to walk 3 L and this was adequate.  Currently using 3 L at rest and 5 L with exertion although it is unclear if it is based on subjective needs.  At follow-up we will check walking desaturation test on 3 and 4 L.  Her blood eosinophils have been normal in the past.    CT Chest data sept 022  IMPRESSION: 1. No acute cardiopulmonary process. 2. Aortic Atherosclerosis (ICD10-I70.0) and Emphysema (ICD10-J43.9).     Electronically Signed   By: Greig Pique M.D.   On: 03/27/2022 21:05  No results found.   Latest Reference Range & Units 10/02/08 22:05 11/09/16 22:06 02/27/19 03:45 02/28/19 01:15 03/01/19 03:22 03/02/19 01:55 03/03/19 01:54 03/19/19 09:56 11/18/19 13:26 10/20/21 09:53  Eosinophils Absolute 0.0 - 0.7 K/uL 0.1 0.2 0.0 0.0 0.0 0.0 0.0 0.1 0.2 0.1    OV 07/05/2022  Subjective:  Patient ID: Maria [name], female , DOB: [birth year] , age 89 y.o. , MRN: [id number] , ADDRESS: [address] [place] [facility]  [id number] PCP [name], NP Patient Care Team: [name], NP as PCP - General (Nurse Practitioner)  This Provider for this visit: Treatment Team:  Attending Provider: Geronimo Amel, MD   07/05/2022 -   Chief Complaint  Patient presents with   Follow-up    Pt states she has been having some heaviness in her chest. States she is about the same as last visit.     HPI Maria [name] 68 y.o. -returns for follow-up.  After last visit she wanted to participate in [employer] biological injection research protocol to prevent COPD exacerbations.  However she screen failed because of prolonged QTc which we attributed to her recent antidepressant.  We updated her primary care physician about it.  She continues on the same dose of antidepressant.  No arrhythmias and no hospitalizations due to cardiac issues since.  She went on a cruise with her family and return mid November 2023.  Then 1 or 2 days before Thanksgiving 2023 she developed COVID-19.  She believes she was treated with Paxlovid  and prednisone .  However the cough still lingers and she feels she is has an ongoing exacerbation.  She is open to the idea of retaking prednisone .  She is frustrated she did not qualify for research protocol.  While she was ill the Wednesday before Thanksgiving she did trip and then fractured her left lower extremity foot.  She has a boot on.  At this point in time for her pregnancy with exacerbation she is continuing on Breztri  and also Roflumilast .  She is never taken and acetylcysteine  [1 study from [place] showing benefit].  We tested her eosinophils in the past and they were normal so she did not qualify for Dupixent.   Of note: She recently got her living will and advanced care planning directives done.  She has assigned her daughter who I need a is the main healthcare power of attorney.  She also prefers to have a natural death and she has signed advanced directive papers for this.  Regarding her  prolonged QTc: In October 2023 QTc was 475 ms.  We did an EKG today and I personally visualized the trace.  Her QT is 4  and 8 ms.  Her QTc H is 429 ms..  This is equivalent to the QTc Fredericka of 434 ms for a heart rate of 72 bpm.  Much improved and normal PFT  OV 08/29/2022  Subjective:  Patient ID: Maria [name], female , DOB: 12/21/1955 , age 33 y.o. , MRN: [id number] , ADDRESS: [address] [place] [facility] [id number] PCP [name], NP Patient Care Team: [name], NP as PCP - General (Nurse Practitioner)  This Provider for this visit: Treatment Team:  Attending Provider: Geronimo Amel, MD    08/29/2022 -   Chief Complaint  Patient presents with   Follow-up    Cough  and wheeze persistent.  PFT today.     HPI Maria [name] 68 y.o. -returns for follow-up.  Is because of repeated flareups we got a pulmonary function test but it is actually stable in the last few years.  She is reassured by this.  She continues on her baseline 3 L oxygen , Breztri  inhaler, Roflumilast .  She is also started and acetylcysteine  over-the-counter.  She continues with Mucinex  as needed.  Despite all this her symptoms are still significant especially her chronic cough.  She was last seen in research for a screening study for frequent exacerbations by [employer].  Since then no new emergency room visits.  No medication changes no hospitalizations no urgent care visits no antibiotics no prednisone .  However in the interim she did get rid of boot in her feet.  Her gabapentin  dose has been reduced.  She reports her cough is 4 out of 5.  The gabapentin  really has not helped.  She is using the gabapentin  for shoulder pain though.  She does have some thick mucus.  She is on Mucinex  already.  She has never tried 3% saline nebulizer.  She is willing to try this.  Chart review shows that she has never had RSV vaccine.    OV 05/07/2024  Subjective:  Patient ID: Maria [name], female , DOB: 23-Oct-1955 ,  age 56 y.o. , MRN: [id number] , ADDRESS: [address] [place] [facility] [id number] PCP [name], NP Patient Care Team: [name], NP as PCP - General (Nurse Practitioner) Ladona Heinz, MD as PCP - Cardiology (Cardiology)  This Provider for this visit: Treatment Team:  Attending Provider: Geronimo Amel, MD   05/07/2024 -   Chief Complaint  Patient presents with   COPD    Pt stated since LOV breathing hasn't been very well Dry cough that won't go away  SOB w and w/o exertion, pt stated she could just be sitting down and become SOB  Pt is on 5L of O2     HPI Maria [name] 68 y.o. -returns for standard of care follow-up.  She is a research patient but this is a standard of care visit.  She tells me that compared to 1 year ago shortness of breath is better but stable compared to 6 months ago and 3 months ago.  She still continues to deal with significant dry cough it is severe.  This is unchanged in the last 1 year.  This is despite Robitussin and Tessalon  Perles.  Despite think she is stable and better she is also saying she is increasing more oxygen  need at rest.  Baseline she uses 3 L nasal cannula but sometimes she subjectively cranks it up to 5 L.  Today at 3 L nasal cannula she was saturating fine at 97%.  She is requesting  palliative care relief for the shortness of breath.  But more so especially for the cough.  We discussed opioid therapy and she is open to this.**  In terms of her COPD treatment she is on oxygen , Breztri  and recent injections every 2 weeks which she is tolerating well and is 100% compliant.  We looked at standard of care [facility] for her but is $800 on her insurance per month and therefore she does not want to do it.  Last eosinophil check was few years ago [standard of care medications or Dupixent and Nucala] last blood gas was a couple of years ago and she did not have hypercapnia.  If she does have hypercapnia she will qualify for BiPAP.  These measures  can improve her functional status.   Of note she had a PET scan she has a new lung nodule this month on the CT and the PET scan is low indeterminate.  Repeat follow-up in 3 months is recommended.  I did share these results with her.  This would be an adverse event for the trial unrelated to the study drug.  Routine finding.  The lung nodule is 1 cm.    CT Chest data from date: 04/24/24  - personally visualized and independently interpreted : .  Personally visualized - my findings are: Agree with these findings   IMPRESSION: 1. New 11.6 mm posterior left lower lobe nodule. Lung-RADS 4B, suspicious. Additional imaging evaluation or consultation with Pulmonology or Thoracic Surgery recommended. These results will be called to the ordering clinician or representative by the Radiologist Assistant, and communication documented in the PACS or [employer]. 2. Aortic atherosclerosis (ICD10-I70.0). Coronary artery calcification. 3.  Emphysema (ICD10-J43.9).     Electronically Signed   By: Newell Eke M.D.   On: 04/24/2024 10:50   PET SCAN 05/03/24   BONES AND SOFT TISSUE: A focus of hypermetabolism about the anterior aspect of the right femoral head-neck junction is likely due to muscular tendinous strain at SUV 4.9. No adjacent osseous abnormality. Osteopenia. Right femoral head avascular necrosis suspected on image 140/6. Probable bone island in the left iliac. Cervical spine fixation. No metabolically active aggressive osseous lesion.   IMPRESSION: 1. The left lower lobe pulmonary nodule of interest is less apparent today, likely due to nondedicated technique and volume loss at the lung bases. No correlate hypermetabolism. Given that the nodule is at the low end of PET resolution, this remains indeterminate and lung cancer screening CT follow-up in 3 months is recommended. 2. Aortic atherosclerosis (ICD10-I70.0), coronary artery atherosclerosis, and emphysema  (ICD10-J43.9). 3. 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 , MRN: [id number] , ADDRESS: [address] [place] [facility] [id number] PCP [name], NP Patient Care Team: [name], NP as PCP - General (Nurse Practitioner) Ladona Heinz, MD as PCP - Cardiology (Cardiology)  This Provider for this visit: Treatment Team:  Attending Provider: Geronimo Amel, MD    Follow-up emphysema - alpha 1 MZ in dec 2020  -- On Trelegy for few years as of 2022.  Dose increased early 2022  -Insurance denied alpha-1 replacement in 2020 07/2020 Follow-up concern interstitial lung disease post COVID-19 in August 2020 -> feb 2021 Case conf ILD: NO ILD Follow-up chronic hypoxemic respiratory failure secondary to cp[d  -ABG without  hypercapnia April 2023 Ex smoker 40 pack quit Echo sept 202 0 - ef 65% and diast dysfn Last CT scan of the chest February 2022 -> sep 2023   - emphysema +. No cancer  Chronic coough since jan 2022 -on gabapentin   06/03/2024 -   Chief Complaint  Patient presents with   COPD    Pt stated since LOV breathing has been short SOB occurs with any activity Dry cough Pt on 4l of 02 on POC     HPI Maria CABEZA 68 y.o. -Adaleen Hulgan is a 68 year old female with COPD who presents with cough and shortness of breath.Presents with husband who Is independent hitorian. She experiences a persistent cough and shortness of breath, which she describes as 'miserable'. She is currently participating in a clinical study and receives a study drug every two weeks.  This study sponsored by [employer].  The study name is called  ALNASA.  The study drug is to reduce COPD exacerbations.  She is on open label extension.  The final part of the open label extension is due to and in the  spring or summer 2026.  Overall she feels 'about the same' since starting the study drug, indicating no significant improvement in her symptoms. She does not take daily prednisone .  Despite the study drug she has had exacerbations but not at an alarming rate but nevertheless she has had exacerbations.  Currently she wants to improve her quality of life with cough and shortness of breath particularly with cough.  There for last visit we checked blood work and this shows elevated eosinophils and IgE.  Eosinophils of 100 cells per cubic millimeter.  In the past we tried to get standard of care Othuvayre to help her symptoms but this was too unaffordable $700 co-pay.  She is interested in Dupixent at this point.  She understands if she enrolls into Dupixent she will have to come off the study drug.  We checked with the sponsor of the study drug and because she is currently in the advanced ages of the study with open label extension and then not allowing recently approved drugs such as Nucala, Dupixent and Othuvuare.  I went over the benefits, limitations and side effects of all these drugs.  She settled on Dupixent.  Her current symptoms include cough and shortness of breath. She has received her flu shot but is unsure about her pneumonia vaccination status.    PFT     Latest Ref Rng & Units 08/29/2022    8:29 AM 11/12/2020   10:43 AM 01/16/2020   10:57 AM 06/24/2019    8:45 AM  PFT Results  FVC-Pre L 2.79  2.94  3.17  2.93   FVC-Predicted Pre % 83  84  90  83   FVC-Post L  3.18  3.25  3.39   FVC-Predicted Post %  91  92  96   Pre FEV1/FVC % % 57  54  55  56   Post FEV1/FCV % %  54  56  59   FEV1-Pre L 1.60  1.60  1.75  1.64   FEV1-Predicted Pre % 62  59  65  60   FEV1-Post L  1.71  1.83  1.99   DLCO uncorrected ml/min/mmHg 9.85  8.06  10.81  12.24   DLCO UNC% % 47  37  50  57   DLCO corrected ml/min/mmHg 9.85  8.06  11.29    DLCO COR %Predicted % 47  37  52    DLVA Predicted % 50  57  61  60    TLC L  4.89  5.25  5.81   TLC % Predicted %  91  98  108   RV % Predicted %  88  86  110        LAB RESULTS last 96 hours No results found.       has a past medical history of Anginal pain, Anxiety, Atherosclerosis ([id number]), Blood transfusion without reported diagnosis (2005), Cancer (HCC) (Ovarian 1978), Chest pain, COPD (chronic obstructive pulmonary disease) (HCC) (2019), Coronary artery disease, COVID-19 long hauler manifesting chronic dyspnea, Depression (December), Dyspnea, Emphysema of lung (HCC) (2019), Oxygen  deficiency (Sept 2020), Oxygen  dependent, Peripheral vascular disease, and Pneumonia.   reports that she quit smoking about 8 years ago. Her smoking use included cigarettes. She started smoking about 53 years ago. She has a 45 pack-year smoking history. She has been exposed to tobacco smoke. She has never used smokeless tobacco.  Past Surgical History:  Procedure Laterality Date   ABDOMINAL HYSTERECTOMY  1978   BREAST BIOPSY Left 04/04/2023   Stereo bx, X clip, path pending   BREAST BIOPSY Left 04/04/2023   MM LT BREAST BX W LOC DEV 1ST LESION IMAGE BX SPEC STEREO GUIDE 04/04/2023 [facility]   CAROTID PTA/STENT INTERVENTION Left 03/07/2023   Procedure: CAROTID PTA/STENT INTERVENTION;  Surgeon: Jama Cordella MATSU, MD;  Location: [facility] INVASIVE CV LAB;  Service: Cardiovascular;  Laterality: Left;   CHOLECYSTECTOMY  2010   COLONOSCOPY     COLONOSCOPY     COLONOSCOPY WITH PROPOFOL  N/A 11/01/2021   Procedure: COLONOSCOPY WITH PROPOFOL ;  Surgeon: Therisa Bi, MD;  Location: [facility] END[facility]COPY;  Service: Gastroenterology;  Laterality: N/A;   ESOPHAGOGASTRODUODENOSCOPY     NECK SURGERY  1999   Trach   OVARIAN CYST REMOVAL Bilateral 1988   SPINE SURGERY  Neck 2002   TRANSCAROTID ARTERY REVASCULARIZATION  Left 01/12/2024   Procedure: TRANSCAROTID ARTERY REVASCULARIZATION (TCAR);  Surgeon: Serene Gaile ORN, MD;  Location: [facility] OR;  Service: Vascular;  Laterality: Left;     Allergies  Allergen Reactions   Montelukast      Bad dreams    Immunization History  Administered Date(s) Administered   Fluad Quad(high Dose 65+) 03/30/2022   INFLUENZA, HIGH DOSE SEASONAL PF 03/30/2022, 03/27/2023, 03/29/2024   Influenza, Seasonal, Injecte, Preservative Fre 04/28/2008, 04/09/2010   Influenza,inj,Quad PF,6+ Mos 04/22/2019, 04/25/2020, 03/15/2021   Influenza-Unspecified 06/01/2008, 05/10/2016, 03/18/2017   Moderna Sars-Covid-2 Vaccination 07/30/2019, 08/28/2019, 06/27/2020   PNEUMOCOCCAL CONJUGATE-20 07/06/2022   Pfizer Covid-19 Vaccine Bivalent Booster 51yrs & up 07/28/2021   Pneumococcal Polysaccharide-23 04/15/2015, 04/22/2020   RSV,unspecified 07/06/2022   Respiratory Syncytial Virus Vaccine,Recomb Aduvanted(Arexvy) 05/30/2022   Td 10/01/2020   Tdap 11/17/2009, 03/27/2023   Zoster Recombinant(Shingrix) 04/16/2020, 06/22/2020    Family History  Problem Relation Age of Onset   Heart failure Mother    Diabetes Mother    Kidney disease Mother    Hypertension Mother    Arthritis Mother    Heart disease Mother    Obesity Mother    COPD Father    Hearing loss Father    Lung cancer Brother    COPD Brother    COPD Brother    Asthma Son    Diabetes Son    Birth defects Son    Breast cancer Maternal Aunt 40     Current Outpatient Medications:    albuterol  (VENTOLIN  HFA) 108 (90 Base) MCG/ACT inhaler, Inhale 2  puffs into the lungs every 6 (six) hours as needed for wheezing or shortness of breath., Disp: 8.5 each, Rfl: 5   albuterol  (VENTOLIN  HFA) 108 (90 Base) MCG/ACT inhaler, Inhale 2 puffs into the lungs every 6 (six) hours as needed for wheezing or shortness of breath., Disp: 6.7 g, Rfl: 3   ASPIRIN  LOW DOSE 81 MG EC tablet, TAKE 1 TABLET BY MOUTH EVERY DAY, Disp: 90 tablet, Rfl: 3   atorvastatin  (LIPITOR) 20 MG tablet, Take 1 tablet (20 mg total) by mouth daily., Disp: 90 tablet, Rfl: 1   Azelastine  HCl 137 MCG/SPRAY SOLN, USE 1 SPRAY INTO EACH  NOSTRIL TWICE A DAY, Disp: 30 mL, Rfl: 1   benzonatate  (TESSALON ) 200 MG capsule, Take 1 capsule (200 mg total) by mouth 3 (three) times daily as needed for cough., Disp: 90 capsule, Rfl: 0   benzonatate  (TESSALON ) 200 MG capsule, Take 1 capsule (200 mg total) by mouth 3 (three) times daily as needed for cough., Disp: 90 capsule, Rfl: 0   Budeson-Glycopyrrol-Formoterol  (BREZTRI  AEROSPHERE) 160-9-4.8 MCG/ACT AERO, Inhale 2 puffs into the lungs in the morning and at bedtime., Disp: 3 each, Rfl: 3   clopidogrel  (PLAVIX ) 75 MG tablet, Take 1 tablet (75 mg total) by mouth daily., Disp: 90 tablet, Rfl: 3   clopidogrel  (PLAVIX ) 75 MG tablet, Take 1 tablet (75 mg total) by mouth daily., Disp: 90 tablet, Rfl: 1   escitalopram  (LEXAPRO ) 20 MG tablet, Take 1 tablet (20 mg total) by mouth daily., Disp: 90 tablet, Rfl: 1   gabapentin  (NEURONTIN ) 600 MG tablet, Take 1 tablet (600 mg total) by mouth every 8 (eight) hours., Disp: 90 tablet, Rfl: 0   HYDROcodone  bit-homatropine (HYCODAN) 5-1.5 MG/5ML syrup, Take 5 mLs by mouth 2 (two) times daily as needed for cough., Disp: 300 mL, Rfl: 0   hydrOXYzine  (VISTARIL ) 25 MG capsule, TAKE 1 CAPSULE BY MOUTH AT BEDTIME AS NEEDED, Disp: 90 capsule, Rfl: 0   ipratropium-albuterol  (DUONEB) 0.5-2.5 (3) MG/3ML SOLN, USE 1 AMPULE IN NEBULIZER 4 TIMES DAILY AS NEEDED, Disp: 360 mL, Rfl: 0   ipratropium-albuterol  (DUONEB) 0.5-2.5 (3) MG/3ML SOLN, Take 1 vial (3 mLs) by nebulization 4 (four) times daily as needed., Disp: 360 mL, Rfl: 0   isosorbide  mononitrate (IMDUR ) 30 MG 24 hr tablet, Take 1 tablet (30 mg total) by mouth every morning., Disp: 30 tablet, Rfl: 11   Multiple Vitamins-Minerals (MULTIVITAMIN WITH MINERALS) tablet, Take 1 tablet by mouth daily. Alive Woman 50+, Disp: , Rfl:    nitroGLYCERIN  (NITROSTAT ) 0.4 MG SL tablet, Place 1 tablet (0.4 mg total) under the tongue every 5 (five) minutes as needed., Disp: 25 tablet, Rfl: 1   OVER THE COUNTER MEDICATION, Place 1 drop  into both eyes daily as needed (Dry and itching eyes). Thera Tears, Disp: , Rfl:    oxybutynin  (DITROPAN -XL) 10 MG 24 hr tablet, Take 1 tablet (10 mg total) by mouth daily., Disp: 90 tablet, Rfl: 3   OXYGEN , Inhale 3-5 L into the lungs continuous. 3-4 liters at rest and 5 liters with any activity.-, Disp: , Rfl:    Respiratory Therapy Supplies (FLUTTER) DEVI, Use as directed, Disp: 1 each, Rfl: 0   roflumilast  (DALIRESP ) 500 MCG TABS tablet, Take 1 tablet by mouth once daily, Disp: 30 tablet, Rfl: 0   sertraline  (ZOLOFT ) 50 MG tablet, Take 1 tablet by mouth once daily, Disp: 90 tablet, Rfl: 0   sodium chloride  HYPERTONIC 3 % nebulizer solution, Take by nebulization 2 (two) times daily., Disp:  750 mL, Rfl: 12   [Paused] metoprolol  succinate (TOPROL -XL) 25 MG 24 hr tablet, Take 1 tablet (25 mg total) by mouth every evening. Take with or immediately following a meal., Disp: 30 tablet, Rfl: 2  Current Facility-Administered Medications:    Study - ARNASA [id number] (open-label) - astegolimab 238 mg/1.7 mL SQ injection (PI-Danyelle Brookover), 476 mg, Subcutaneous, Q14 Days, , 476 mg at 01/03/24 0955   Study - ARNASA [id number] (open-label) - astegolimab 238 mg/1.7 mL SQ injection (PI-Sharlynn Seckinger), 476 mg, Subcutaneous, Q14 Days, , 476 mg at 01/17/24 1006   Study - ARNASA [id number] (open-label) - astegolimab 238 mg/1.7 mL SQ injection (PI-Anokhi Shannon), 476 mg, Subcutaneous, Q14 Days, , 476 mg at 01/31/24 1141   Study - ARNASA [id number] (open-label) - astegolimab 238 mg/1.7 mL SQ injection (PI-Ramiro Pangilinan), 476 mg, Subcutaneous, Q14 Days, , 476 mg at 02/28/24 1002   Study - ARNASA [id number] (open-label) - astegolimab 238 mg/1.7 mL SQ injection (PI-Osiel Stick), 476 mg, Subcutaneous, Q14 Days, , 476 mg at 03/13/24 1002   Study - ARNASA [id number] (open-label) - astegolimab 238 mg/1.7 mL SQ injection (PI-Bryce Cheever), 476 mg, Subcutaneous, Q14 Days, , 476 mg at 03/27/24 1019   Study - ARNASA [id number] (open-label) - astegolimab 238 mg/1.7 mL SQ  injection (PI-Syrianna Schillaci), 476 mg, Subcutaneous, Q14 Days, , 476 mg at 04/10/24 0856   Study - ARNASA [id number] (open-label) - astegolimab 238 mg/1.7 mL SQ injection (PI-Christie Copley), 476 mg, Subcutaneous, Q14 Days, , 476 mg at 04/24/24 0844   Study - ARNASA [id number] (open-label) - astegolimab 238 mg/1.7 mL SQ injection (PI-Dallana Mavity), 476 mg, Subcutaneous, Q14 Days, , 476 mg at 05/08/24 0958   Study - ARNASA [id number] - astegolimab 238 mg/1.7 mL or placebo SQ injection (PI-Jedidiah Demartini), 476 mg, Subcutaneous, Q14 Days,    Study - ARNASA [id number] - astegolimab 238 mg/1.7 mL or placebo SQ injection (PI-Shanterica Biehler), 476 mg, Subcutaneous, Q14 Days, , 476 mg at 09/12/23 0930   Study - ARNASA [id number] - astegolimab 238 mg/1.7 mL or placebo SQ injection (PI-Kwasi Joung), 476 mg, Subcutaneous, Q14 Days,       Objective:   Vitals:   06/03/24 0825  BP: 128/72  Pulse: 60  Temp: 97.7 F (36.5 C)  TempSrc: Oral  SpO2: 97%  Weight: 177 lb (80.3 kg)  Height: 5' 6 (1.676 m)    Estimated body mass index is 28.57 kg/m as calculated from the following:   Height as of this encounter: 5' 6 (1.676 m).   Weight as of this encounter: 177 lb (80.3 kg).  @WEIGHTCHANGE @  Filed Weights   06/03/24 0825  Weight: 177 lb (80.3 kg)     Physical Exam   General: No distress. Looks same, coughs intermittent O2 at rest: yes Cane present: no Sitting in wheel chair: no Frail: no Obese: no Neuro: Alert and Oriented x 3. GCS 15. Speech normal Psych: Pleasant Resp:  Barrel Chest - YES.  Wheeze - no, Crackles - no, No overt respiratory distress CVS: Normal heart sounds. Murmurs - no Ext: Stigmata of Connective Tissue Disease - no HEENT: Normal upper airway. PEERL +. No post nasal drip        Assessment/     Assessment & Plan Stage 4 very severe COPD by GOLD classification (HCC)  COPD, frequent exacerbations (HCC)  Eosinophilia, unspecified type  Chronic cough  Nodule of lower lobe of left lung  Need for  pneumococcal vaccine    PLAN Patient Instructions  Stage 4 very severe COPD by GOLD classification (HCC) Chronic respiratory failure with hypoxia (HCC)  Severe chronic cough due to COPD  -Stable disease without flareup at this visit 06/03/2024 though despite study you have had your share of flare ups although  - Cough continues to be significant but baseline - oingoing ALNASA study injection Open Label Extension every 2 weeks - you qualify for DUpixent and othuvayre standard of care Treatment   - past unable to afford Othuvare  Plan - continue oxygen  3-5 L - Continue - Breztri  scheduled - Continue research injections for now - start Dupixent   - once aproved and prior to first injection you have to formally exit study which you agree to - continue Hycodan cough syrup 5 mL twice daily for palliative relief of cough - Continue Robitussin as needed - Continue Tessalon  Perles as needed  Nodule of lower lobe of left lung - OCt 2025 - NEew. Low uptake on PET Oct 2025  - this might not be cancer  Plan  - repeat ct chest without contrast in Jan 2026 (3 montns)    - CMA please ensure there is order  Vaccination Pneumooccall  - last in 2021  Plan  - prevnar 06/03/2024  and then done for lifetime  Fllowup  - Jan 2026  staandard of care visit with APP Camie after CT chest - keep q2 week research visit    FOLLOWUP    Return for  Jan 2026  staandard of care visit with APP Camie after CT chest.  ( Level 05 visit E&M 2024: Estb >= 40 min visit type: on-site physical face to visit  in total care time and counseling or/and coordination of care by this undersigned MD - Dr Dorethia Cave. This includes one or more of the following on this same day 06/03/2024: pre-charting, chart review, note writing, documentation discussion of test results, diagnostic or treatment recommendations, prognosis, risks and benefits of management options, instructions, education, compliance or risk-factor  reduction. It excludes time spent by the CMA or office staff in the care of the patient. Actual time 40 min)   SIGNATURE    Dr. Dorethia Cave, M.D., F.C.C.P,  Pulmonary and Critical Care Medicine Staff Physician, [facility] Director - Interstitial Lung Disease  Program  Pulmonary Fibrosis [facility] [place], [place], [address]  Pager: [phone], If no answer or between  15:00h - 7:00h: call [phone] Telephone: [phone]  9:08 AM 06/03/2024

## 2024-06-02 NOTE — Patient Instructions (Signed)
 Stage 4 very severe COPD by GOLD classification (HCC) Chronic respiratory failure with hypoxia (HCC) Severe chronic cough due to COPD  -Stable disease without flareup at this visit 06/03/2024 though despite study you have had your share of flare ups although  - Cough continues to be significant but baseline - oingoing ALNASA study injection Open Label Extension every 2 weeks - you qualify for DUpixent and othuvayre standard of care Treatment   - past unable to afford Othuvare  Plan - continue oxygen  3-5 L - Continue - Breztri  scheduled - Continue research injections for now - start Dupixent   - once aproved and prior to first injection you have to formally exit study which you agree to - continue Hycodan cough syrup 5 mL twice daily for palliative relief of cough - Continue Robitussin as needed - Continue Tessalon  Perles as needed  Nodule of lower lobe of left lung - OCt 2025 - NEew. Low uptake on PET Oct 2025  - this might not be cancer  Plan  - repeat ct chest without contrast in Jan 2026 (3 montns)    - CMA please ensure there is order  Vaccination Pneumooccall  - last in 2021  Plan  - prevnar 06/03/2024  and then done for lifetime  Fllowup  - Jan 2026  staandard of care visit with APP Camie after CT chest - keep q2 week research visit

## 2024-06-03 ENCOUNTER — Ambulatory Visit: Admitting: Internal Medicine

## 2024-06-03 ENCOUNTER — Encounter: Payer: Self-pay | Admitting: Internal Medicine

## 2024-06-03 VITALS — BP 128/72 | HR 60 | Temp 97.7°F | Ht 66.0 in | Wt 177.0 lb

## 2024-06-03 DIAGNOSIS — J441 Chronic obstructive pulmonary disease with (acute) exacerbation: Secondary | ICD-10-CM

## 2024-06-03 DIAGNOSIS — J449 Chronic obstructive pulmonary disease, unspecified: Secondary | ICD-10-CM

## 2024-06-03 DIAGNOSIS — D721 Eosinophilia, unspecified: Secondary | ICD-10-CM | POA: Diagnosis not present

## 2024-06-03 DIAGNOSIS — R053 Chronic cough: Secondary | ICD-10-CM

## 2024-06-03 DIAGNOSIS — J9611 Chronic respiratory failure with hypoxia: Secondary | ICD-10-CM

## 2024-06-03 DIAGNOSIS — Z23 Encounter for immunization: Secondary | ICD-10-CM

## 2024-06-03 DIAGNOSIS — R911 Solitary pulmonary nodule: Secondary | ICD-10-CM

## 2024-06-03 NOTE — Addendum Note (Signed)
 Addended by: BURT EVERTT RAMAN on: 06/03/2024 10:04 AM   Modules accepted: Orders

## 2024-06-04 ENCOUNTER — Other Ambulatory Visit (HOSPITAL_COMMUNITY): Payer: Self-pay

## 2024-06-05 ENCOUNTER — Other Ambulatory Visit: Payer: Self-pay

## 2024-06-05 ENCOUNTER — Encounter: Admitting: Internal Medicine

## 2024-06-05 ENCOUNTER — Other Ambulatory Visit: Payer: Self-pay | Admitting: Cardiology

## 2024-06-05 ENCOUNTER — Other Ambulatory Visit (HOSPITAL_COMMUNITY): Payer: Self-pay

## 2024-06-05 DIAGNOSIS — I2089 Other forms of angina pectoris: Secondary | ICD-10-CM

## 2024-06-05 DIAGNOSIS — E7800 Pure hypercholesterolemia, unspecified: Secondary | ICD-10-CM

## 2024-06-05 DIAGNOSIS — Z006 Encounter for examination for normal comparison and control in clinical research program: Secondary | ICD-10-CM

## 2024-06-05 MED ORDER — STUDY - ARNASA GB43374 (OPEN-LABEL) - ASTEGOLIMAB 238 MG/1.7 ML SQ INJECTION (PI-RAMASWAMY)
476.0000 mg | INJECTION | SUBCUTANEOUS | Status: AC
  Administered 2024-06-05: 238 mg via SUBCUTANEOUS
  Filled 2024-06-05: qty 3.4

## 2024-06-05 MED ORDER — ATORVASTATIN CALCIUM 20 MG PO TABS
20.0000 mg | ORAL_TABLET | Freq: Every day | ORAL | 1 refills | Status: AC
  Filled 2024-06-05 – 2024-06-06 (×2): qty 90, 90d supply, fill #0

## 2024-06-05 NOTE — Progress Notes (Signed)
 Seen by sub-I Eulalio

## 2024-06-05 NOTE — Research (Signed)
 Title: A phase III Open-Label extension study to evaluate the long-term safety of Astegolimab in patients with Chronic Obstructive Pulmonary disease.   Dose and Duration of Treatment: Astegolimab is presented as a sterile, slightly brown-yellow solution. Each single-use, 2.25 mL pre-filled syringe contains 1.7 mL deliverable volume. Astegolimab drug product is formulated at 140 mg/mL astegolimab with 114 mM succinic acid, 200 mM L-arginine, 10 mM L-methionine, 0.06% (w/v) polysorbate 20, pH 5.7. Placebo for astegolimab is supplied in an identical pre-filled syringe configuration.   Protocol # [id number] Sponsor: [employer] [id number], [place]   Protocol Version: v3 dated 15Oct2024 IB: version 9.0 dated April 2024 ICF:  ICFs: Make sure that the subject is re-consented with the updated ICFs Main ICF: [employer] IR[employer] Approved Version 15 Jun 2023, Revised 28Nov2024 Mobile Nursing: [employer] IR[employer] Approved Version 26Oct2023, Revised 08Aug2024 Optional Home Study Treatment: IRB Approved Version 12 May 2022 Revised 23 Feb 2023 ADDENDUM 1: IRB Approved Version 24Jul2024 Revised 8Aug2024 Lab Manual: V2.1.0 dated 21Jun2024   Mechanism of action: Astegolimab (also known as [id number] or [id number]) is a fully human, IgG2 monoclonal antibody that binds with high affinity to the interleukin (IL)-33 (IL-33) receptor, ST2, thereby blocking the signaling of IL-33, an inflammatory cytokine of the IL-1 family and member of the alarmin class of molecules. Astegolimab binds with high affinity to the human and cynomolgus monkey receptor for IL-33, ST2, and blocks IL-33 binding, thus inhibiting association with the IL-1R accessory protein (AcP) co-receptor and formation of an activated receptor complex.   Key Inclusion Criteria: Able and willing to provide written informed consent and to comply with the study protocol. Ability to comply with the requirements of study protocol, according  to the investigators's best judgement. Completion of the 52-week treatment period in either [place] or Study HA55667.   Key Exclusion Criteria: Significant non- compliance in the parent study, specifically defined as missing scheduled visits, per investigators judgement. Any new clinically significant pulmonary disease other than COPD since enrolling in the study. Unstable cardiac disease, myocardial infarction, or [place]  Heart Association Class III or IV heart failure since enrolling in the parent study.   Integrated Pharmacokinetic/Pharmacodynamic Analysis:  In Studies HA60757, [id number], and [id number], exploratory biomarker analysis showed there were consistent decreases in blood eosinophil counts throughout the treatment period, potentially mediated by a direct effect of IL-33 on eosinophil progenitors. In [place], there was no significant difference in fractional exhaled nitric oxide (FeNO) levels (reflecting airway IL-4/IL-13 activity) between astegolimab-treated groups relative to placebo throughout the treatment period. These data suggest that astegolimab has only a limited effect on Type 2 inflammation in asthma. No data are applicable for Study [id number]. No data are available yet for Study HA56688.   Special Warnings/Considerations: Administration of astegolimab, a protein therapeutic, may lead to the development of anti-astegolimab antibodies which could lead to AEs and/or decreased exposure. In non-clinical studies (see Section 4.2), ADA incidence has generally been low and there was no apparent ADA impact on PK and safety in these studies. To date, the immunogenicity rates observed with astegolimab in clinical studies have been relatively low as well (see Immunogenicity Section 5.6). Several clinical studies have been conducted in patients with asthma, atopic dermatitis, COPD (IIS Study HA59431) and COVID-19 severe pneumonia, which generally showed low incidence of ADAs (Table 31).  There has been no correlation between ADA status and clinical findings or increased incidence of AEs. Astegolimab is now being considered in a larger study for the treatment of  COPD. This patient population is typically considered to have a hyper-responsive immune system. Route of administration for this molecule will be Connell, either Q2W or Q4W. These factors increase the risk of development of an immune response to astegolimab, specifically with repeat dosing. From the previous IIS Study HA59431, incidence of ADAs was low in COPD patients; this remains to be confirmed in a larger study. To monitor ADA development in ongoing studies, serum samples will be collected from patients at protocol-defined intervals. Patients who test positive for antibodies and have clinical sequelae that are considered potentially related to an ADA response may also be asked to return for additional follow-up testing.   Drug Interaction Studies: No PK drug interaction studies have been conducted to date.   Serious Adverse Reactions Observed in Asthma Study: During the treatment period, a similar proportion of patients across all cohorts experienced at least one AE, regardless of causality (Table 22). In total, 77.2%, 70.9%, 72.2%, and 72.1% of patients reported at least 1 AE in the placebo, 70 mg, 210 mg, and 490 mg groups, respectively. The most common AE's (>5% in any treatment group) were asthma, nasopharyngitis, upper respiratory tract infection, headache, and injection site reaction (ISR). The most common drug-related AE was ISR, which was reported more frequently in the astegolimab treatment groups than in the placebo group (1 [0.8%] patient in the placebo group, 10 [7.9%] patients with 70 mg, 8 [6.3%] patients with 210 mg, 6 [4.9%] patients with 490 mg). All ISRs were non-serious and mild or moderate in severity. During the treatment period, 50 SAEs were reported in 37 (7.4%) patients. The number of patients reporting SAEs was  comparable across all cohorts (11 SAEs in 8 patients on placebo, 21 SAEs in 14 patients on 70 mg, 11 SAEs in 9 patients on 210 mg, and 7 SAEs in 6 patients on 490 mg). The most common SAE was asthma. One SAE of moderate livedo reticularis (70 mg) was considered a suspected unexpected serious adverse drug reaction (SUSAR) related to astegolimab and was reported two days after the second dose, leading to discontinuation of astegolimab. Two (0.4%) patients reported anaphylaxis and hypersensitivity reactions: 1 severe SAE of anaphylactic reaction (placebo), and 1 moderate hypersensitivity (490 mg) considered related to astegolimab. Three (0.6%) patients experienced a potential Major Adverse Cardiac Event (MACE) (1 patient each in the placebo, 70 mg, and 210 mg groups). None of the potential MACE were considered related to astegolimab. Overall, 233 (46.4%) patients reported events of infection. A comparable number of patients reported infection across all treatment groups (65 [51.2%] patients on placebo, 55 [43.3%] patients on 70 mg, 58 [46.0%] patients on 210 mg, and 55 [45.1%] patients on 490 mg). The most frequently reported infection (>=10% incidence) was nasopharyngitis (12.7%). One patient on astegolimab 210 mg and the partner of one patient on placebo became pregnant during the study. Both delivered normal/healthy babies. Two deaths, unrelated to study drug, were reported: one patient on 210 mg astegolimab died following an SAE of asthma; the other patient on 490 mg astegolimab had an unexplained death. There were no clinically meaningful changes in laboratory parameters, vital signs, or ECG results, other than the 10% decrease in mean blood eosinophil counts in the astegolimab-treated groups, with no safety concerns. Treatment-induced ADAs were comparable between the astegolimab groups and had no impact on safety. Overall, astegolimab was well tolerated at all doses used and had a safety profile consistent with  that observed in the previous astegolimab Phase I studies.   Serious  Adverse Reactions Observed in Previous COPD Study: In the completed IIS Study HA59431, a total of 81 patients received at least one dose of astegolimab or placebo. The safety profile of astegolimab was similar to that of placebo. There were a total of 222 AEs reported in 62 patients. A total of 28 (72%) patients in the placebo arm and 34 (81%) patients in the astegolimab arm reported at least one AE. The most commonly reported AEs were headache followed by urinary tract infection and viral upper respiratory tract infection. A total of 39 SAEs were reported in 28 (35%) patients; 16 (41%) patients in the placebo group and 12 (29%) in the astegolimab group. The most commonly reported SAE was hospital admission for community acquired pneumonia. Four SAEs resulted in patient discontinuation from treatment (1 patient in the placebo group and 3 patients in the astegolimab group). One patient in the placebo group experienced an AESI of potential MACE (heart failure, unrelated to the study treatment). No anaphylaxis or pregnancies were reported. Two deaths, unrelated to study treatment, were reported: 1 patient in the placebo group died after hospital acquired pneumonia, and another patient in the placebo group died after pneumonia and type 2 respiratory failure.   Safety Data: Astegolimab has been generally well tolerated. There have been 49 patient deaths across all astegolimab studies (Section 5.5.2), none of which were considered related to astegolimab. A total of 144 subjects experienced a total of 224 SAEs across all astegolimab studies. Of these, an SAE livedo reticularis observed in 1 patient was considered related to astegolimab by the investigator (Section 5.5.3). AEs leading to withdrawal (Section 5.5.4) have generally occurred at a rate similar to what would be expected for clinical trials in the studies' respective indications.   [facility]  @ [place] Clinical Research Coordinator note:    This visit for Subject: [id number] DOB: December 20, 1955 on 19Nov2025 for the above protocol is Visit/Encounter # week 30 and is for purpose of research.    Subject expressed continued interest and consent in continuing as a study subject. Subject confirmed that there was no change in contact information (e.g. address, telephone, email). Subject thanked for participation in research and contribution to science. The Subject was informed that the PI Dr. Geronimo continues to have oversight of the subject's visits and course through relevant discussions, reviews and specifically of this visit by routing of this note to the PI.   Concomitant medications and adverse events were reviewed with the subject. Vital signs were obtained and reviewed by the sub-investigator. Only half (1.7 mL) of the investigational product was administered to the subject for safety reasons related to inability to use one of the two pre-loaded syringes. The patient was monitored for 30 minutes after the injection. Please refer to subject binder for more information.  Signed by Tinnie Mulberry Clinical Research Coordinator  [facility]  [place], [place]

## 2024-06-06 ENCOUNTER — Other Ambulatory Visit (HOSPITAL_COMMUNITY): Payer: Self-pay

## 2024-06-06 ENCOUNTER — Other Ambulatory Visit: Payer: Self-pay

## 2024-06-07 ENCOUNTER — Telehealth: Payer: Self-pay

## 2024-06-07 NOTE — Telephone Encounter (Signed)
 Maria [name] - this is for Dupixent but if approved she needs to formaly exit research study before first dose. So, please coordinate  Received referral for new start Dupixent. Opening benefits investigation in this thread.

## 2024-06-10 ENCOUNTER — Ambulatory Visit: Attending: Nurse Practitioner | Admitting: Nurse Practitioner

## 2024-06-10 ENCOUNTER — Other Ambulatory Visit (HOSPITAL_COMMUNITY): Payer: Self-pay

## 2024-06-10 ENCOUNTER — Encounter: Payer: Self-pay | Admitting: Nurse Practitioner

## 2024-06-10 VITALS — BP 148/73 | HR 77 | Temp 97.3°F | Resp 20 | Ht 65.0 in | Wt 177.0 lb

## 2024-06-10 DIAGNOSIS — G8929 Other chronic pain: Secondary | ICD-10-CM | POA: Diagnosis present

## 2024-06-10 DIAGNOSIS — Q761 Klippel-Feil syndrome: Secondary | ICD-10-CM | POA: Diagnosis not present

## 2024-06-10 DIAGNOSIS — M5010 Cervical disc disorder with radiculopathy, unspecified cervical region: Secondary | ICD-10-CM | POA: Insufficient documentation

## 2024-06-10 DIAGNOSIS — Z79899 Other long term (current) drug therapy: Secondary | ICD-10-CM | POA: Diagnosis present

## 2024-06-10 DIAGNOSIS — M25511 Pain in right shoulder: Secondary | ICD-10-CM | POA: Insufficient documentation

## 2024-06-10 DIAGNOSIS — G894 Chronic pain syndrome: Secondary | ICD-10-CM | POA: Diagnosis present

## 2024-06-10 MED ORDER — GABAPENTIN 600 MG PO TABS
600.0000 mg | ORAL_TABLET | Freq: Three times a day (TID) | ORAL | 5 refills | Status: AC
  Filled 2024-06-10 – 2024-06-25 (×3): qty 90, 30d supply, fill #0
  Filled 2024-07-24: qty 90, 30d supply, fill #1
  Filled 2024-08-21: qty 90, 30d supply, fill #2

## 2024-06-10 NOTE — Progress Notes (Signed)
 Safety precautions to be maintained throughout the outpatient stay will include: orient to surroundings, keep bed in low position, maintain call bell within reach at all times, provide assistance with transfer out of bed and ambulation.

## 2024-06-10 NOTE — Progress Notes (Signed)
 PROVIDER NOTE: Interpretation of information contained herein should be left to medically-trained personnel. Specific patient instructions are provided elsewhere under Patient Instructions section of medical record. This document was created in part using AI and STT-dictation technology, any transcriptional errors that may result from this process are unintentional.  Patient: Maria [name]  Service: E/M   PCP: [name], NP  DOB: Aug 15, 1955  DOS: 06/10/2024  Provider: Emmy MARLA Blanch, NP  MRN: [id number]  Delivery: Face-to-face  Specialty: Interventional Pain Management  Type: Established Patient  Setting: Ambulatory outpatient facility  Specialty designation: 09  Referring Prov.: [name], NP  Location: Outpatient office facility       History of present illness (HPI) Maria [name], a 68 y.o. year old female, is here today because of her neck pain (right). Maria [name] primary complain today is Neck Pain  Pertinent problems: Maria [name] has Stage 2 moderate COPD by GOLD classification (HCC); Tear of left gluteus minimus tendon; Aortic atherosclerosis; Right foot pain; Benign neoplasm of parathyroid gland; Chronic bronchitis (HCC); History of DVT (deep vein thrombosis); Left buttock pain; Smoking history; Numbness and tingling in both hands; Chronic right shoulder pain; Radicular pain in right arm; Cervical fusion syndrome (C6-7); Cervical disc disorder with radiculopathy of cervical region (right); Chronic pain syndrome; Generalized body aches; Closed displaced fracture of fifth metatarsal bone of left foot; Left foot pain; Peripheral neuropathy; and Lung nodule on their pertinent problem list.  Pain Assessment: Severity of Chronic pain is reported as a 6 /10. Location: Neck Right/Radiates down whole body to legs. Onset: More than a month ago. Quality: Constant, Numbness, Burning, Pins and needles. Timing: Constant. Modifying factor(s): Denies. Vitals:  height is 5' 5 (1.651 m) and weight is  177 lb (80.3 kg). Her temporal temperature is 97.3 F (36.3 C) (abnormal). Her blood pressure is 148/73 (abnormal) and her pulse is 77. Her respiration is 20 and oxygen  saturation is 98%.  BMI: Estimated body mass index is 29.45 kg/m as calculated from the following:   Height as of this encounter: 5' 5 (1.651 m).   Weight as of this encounter: 177 lb (80.3 kg).  Last encounter: Visit date not found. Last procedure: Visit date not found.  Reason for encounter: medication management. No change in medical history since last visit.  Patient's pain is at baseline.  Patient continues multimodal pain regimen as prescribed.  States that it provides pain relief and improvement in functional status.   Discussed the use of AI scribe software for clinical note transcription with the patient, who gave verbal consent to proceed.  History of Present Illness   Maria [name] is a 68 year old female who presents for pain management.  She experiences chronic neck pain radiating to her arm, with a current pain level of six out of ten. The pain significantly impacts her sleep, often requiring her to sit up in bed.  She has been taking gabapentin , one tablet every eight hours, without side effects, but has run out of her prescription and has no refills available. She is also on Plavix  but has not been using any other medications for her pain besides gabapentin .  She has undergone interventional therapies, including cervical epidural injections, which provided temporary relief for a day or two. She had a cervical fusion years ago, but further surgical intervention is not possible due to her breathing and lung condition.     Pharmacotherapy Assessment   Gabapentin  (Neurontin ) 600 mg tablet every 8  hours as needed for neuropathy Monitoring: [place] PMP: PDMP reviewed during this encounter.       Pharmacotherapy: No side-effects or adverse reactions reported. Compliance: No problems  identified. Effectiveness: Clinically acceptable.  Erlene Doyal SAUN[place], [place]  06/10/2024  1:52 PM  Sign when Signing Visit Safety precautions to be maintained throughout the outpatient stay will include: orient to surroundings, keep bed in low position, maintain call bell within reach at all times, provide assistance with transfer out of bed and ambulation.     UDS:  No results found for: SUMMARY  No results found for: CBDTHCR No results found for: D8THCCBX No results found for: D9THCCBX  ROS  Constitutional: Denies any fever or chills Gastrointestinal: No reported hemesis, hematochezia, vomiting, or acute GI distress Musculoskeletal: Neck pain (right> left) Neurological: No reported episodes of acute onset apraxia, aphasia, dysarthria, agnosia, amnesia, paralysis, loss of coordination, or loss of consciousness  Medication Review  Azelastine  HCl, Flutter, HYDROcodone  bit-homatropine, OVER THE COUNTER MEDICATION, Oxygen -Helium, albuterol , aspirin  EC, atorvastatin , benzonatate , budesonide -glycopyrrolate -formoterol , clopidogrel , escitalopram , gabapentin , hydrOXYzine , ipratropium-albuterol , isosorbide  mononitrate, metoprolol  succinate, multivitamin with minerals, nitroGLYCERIN , oxybutynin , roflumilast , sertraline , and sodium chloride  HYPERTONIC  History Review  Allergy: Maria [name] is allergic to montelukast . Drug: Maria [name]  reports no history of drug use. Alcohol:  reports no history of alcohol use. Tobacco:  reports that she quit smoking about 8 years ago. Her smoking use included cigarettes. She started smoking about 53 years ago. She has a 45 pack-year smoking history. She has been exposed to tobacco smoke. She has never used smokeless tobacco. Social: Maria [name]  reports that she quit smoking about 8 years ago. Her smoking use included cigarettes. She started smoking about 53 years ago. She has a 45 pack-year smoking history. She has been exposed to tobacco smoke. She has never used  smokeless tobacco. She reports that she does not drink alcohol and does not use drugs. Medical:  has a past medical history of Anginal pain, Anxiety, Atherosclerosis ([id number]), Blood transfusion without reported diagnosis (2005), Cancer (HCC) (Ovarian 1978), Chest pain, COPD (chronic obstructive pulmonary disease) (HCC) (2019), Coronary artery disease, COVID-19 long hauler manifesting chronic dyspnea, Depression (December), Dyspnea, Emphysema of lung (HCC) (2019), Oxygen  deficiency (Sept 2020), Oxygen  dependent, Peripheral vascular disease, and Pneumonia. Surgical: Maria [name]  has a past surgical history that includes Abdominal hysterectomy (1978); Ovarian cyst removal (Bilateral, 1988); Neck surgery (1999); Spine surgery (Neck 2002); Cholecystectomy (2010); Colonoscopy; Colonoscopy; Esophagogastroduodenoscopy; Colonoscopy with propofol  (N/A, 11/01/2021); CAROTID PTA/STENT INTERVENTION (Left, 03/07/2023); Breast biopsy (Left, 04/04/2023); Breast biopsy (Left, 04/04/2023); and Transcarotid artery revascularization (Left, 01/12/2024). Family: family history includes Arthritis in her mother; Asthma in her son; Birth defects in her son; Breast cancer (age of onset: 74) in her maternal aunt; COPD in her brother, brother, and father; Diabetes in her mother and son; Hearing loss in her father; Heart disease in her mother; Heart failure in her mother; Hypertension in her mother; Kidney disease in her mother; Lung cancer in her brother; Obesity in her mother.  Laboratory Chemistry Profile   Renal Lab Results  Component Value Date   BUN 14 03/29/2024   CREATININE 0.92 03/29/2024   GFR 64.61 03/29/2024   GFRAA >60 03/03/2019   GFRNONAA >60 01/13/2024    Hepatic Lab Results  Component Value Date   AST 18 03/29/2024   ALT 16 03/29/2024   ALBUMIN  3.8 03/29/2024   ALKPHOS 86 03/29/2024   LIPASE 19 11/09/2016    Electrolytes Lab Results  Component Value Date  NA 144 03/29/2024   K 4.0 03/29/2024   CL  106 03/29/2024   CALCIUM  9.5 03/29/2024   MG 2.3 05/13/2023   PHOS 3.9 05/13/2023    Bone Lab Results  Component Value Date   VD25OH 35.22 06/03/2019    Inflammation (CRP: Acute Phase) (ESR: Chronic Phase) Lab Results  Component Value Date   CRP <3.0 09/25/2023   ESRSEDRATE 9 09/25/2023         Note: Above Lab results reviewed.  Recent Imaging Review  NM PET Image Initial (PI) Skull Base To Thigh EXAM: PET AND CT SKULL BASE TO MID THIGH 05/03/2024 01:18:26 PM  TECHNIQUE:  RADIOPHARMACEUTICAL: 8.75 mCi F-18 FDG Uptake time 60 minutes. Glucose level 91 mg/dl.  PET imaging was acquired from the base of the skull to the mid thighs. Non-contrast enhanced computed tomography was obtained for attenuation correction and anatomic localization.  COMPARISON: Chest CT of 10/25 and PET of 06/26/2023.  CLINICAL HISTORY: Lung nodule, > 8mm; 11.6 mm nodule since 06/2023 PET. 8.38mCi FDG GIVEN VIA LT HAND IV; BG = 91mg /dL; *LAST PET/CT SCAN [id number]; LUNG NODULE; LUNG >41mm; PERSONAL HX OF TOBACCO USE PRESENTING HAZARDS TO HEALTH; SCREENING FOR MALIGNANT NEOPLASM OF RESPIRATORY ORGAN; NO HX DM; NO RECENT SX / BX; NO ANTIBIOTICS / STEROIDS  FINDINGS:  HEAD AND NECK: No cervical nodal hypermetabolism or adenopathy. Right maxillary sinus mucous retention cyst or polyp. Left carotid stent right carotid atherosclerosis.  CHEST: No thoracic nodal hypermetabolism. The previously described left lower lobe pulmonary nodule is less apparent today, secondary to non-dedicated CT technique and poor inspiratory effort, likely identified at 8 mm on image 74/6. There is volume loss at the lung bases. No correlate hypermetabolism in this area. . Aortic and LAD coronary artery calcification. Aortic atherosclerosis. Moderate centrilobular emphysema.  ABDOMEN AND PELVIS: No abdominal or pelvic nodal or parenchymal hypermetabolism. Cholecystectomy. A 6 mm upper pole right renal  hyperattenuating lesion is too small to characterize but favored to represent a complex cyst. Colonic stool burden suggests constipation. Scattered colonic diverticula. Hysterectomy. Pelvic floor laxity. Physiologic activity within the gastrointestinal and genitourinary systems.  BONES AND SOFT TISSUE: A focus of hypermetabolism about the anterior aspect of the right femoral head-neck junction is likely due to muscular tendinous strain at SUV 4.9. No adjacent osseous abnormality. Osteopenia. Right femoral head avascular necrosis suspected on image 140/6. Probable bone island in the left iliac. Cervical spine fixation. No metabolically active aggressive osseous lesion.  IMPRESSION: 1. The left lower lobe pulmonary nodule of interest is less apparent today, likely due to nondedicated technique and volume loss at the lung bases. No correlate hypermetabolism. Given that the nodule is at the low end of PET resolution, this remains indeterminate and lung cancer screening CT follow-up in 3 months is recommended. 2. Aortic atherosclerosis (ICD10-I70.0), coronary artery atherosclerosis, and emphysema (ICD10-J43.9). 3. 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   Weight as of this encounter: 177 lb (80.3 kg). Ideal: Ideal body weight: 57 kg (125 lb 10.6 oz) Adjusted ideal body weight: 66.3 kg (146 lb 3.2 oz) General appearance: Well nourished, well developed, and well hydrated. In no  apparent acute distress Mental status: Alert, oriented x 3 (person, place, & time)       Respiratory: No evidence of acute respiratory distress Eyes: PERLA  Musculoskeletal: Right neck pain Cervical Spine Exam  Skin & Axial Inspection: No masses, redness, edema, swelling, or associated skin lesions Alignment: Symmetrical Functional ROM: Pain restricted ROM, to the right Stability: No instability detected Muscle Tone/Strength: Functionally intact. No obvious neuro-muscular anomalies detected. Sensory (Neurological): Referred pain pattern Palpation: No palpable anomalies             Assessment   Diagnosis Status  1. Medication management   2. Cervical fusion syndrome (C6-7)   3. Chronic right shoulder pain   4. Cervical disc disorder with radiculopathy of cervical region (right)   5. Chronic pain syndrome    Controlled Controlled Controlled   Updated Problems: No problems updated.  Plan of Care  Problem-specific:  Assessment and Plan    Cervical disc disorder with radiculopathy and chronic pain syndrome Chronic cervical disc disorder with radiculopathy causing neck and arm pain. Previous interventions provided temporary relief. Gabapentin  used for pain management with dosage adjustments for optimal balance. - Prescribed gabapentin  180 tablets with 5 refills. - Advised to contact office if additional medication needed before next refill.  Prescribing drug monitoring (PDMP) reviewed.  Schedule follow-up in 6 months for medication management.      Maria [name] has a current medication list which includes the following long-term medication(s): albuterol , albuterol , atorvastatin , azelastine  hcl, ipratropium-albuterol , ipratropium-albuterol , isosorbide  mononitrate, nitroglycerin , roflumilast , sertraline , gabapentin , and [Paused] metoprolol  succinate.  Pharmacotherapy (Medications Ordered): Meds ordered this encounter  Medications   gabapentin  (NEURONTIN ) 600 MG tablet     Sig: Take 1 tablet (600 mg total) by mouth every 8 (eight) hours.    Dispense:  90 tablet    Refill:  5    Fill one day early if pharmacy is closed on scheduled refill date. May substitute for generic if available.   Orders:  No orders of the defined types were placed in this encounter.     Return in about 6 months (around 12/08/2024) for (F2F), (MM), Emmy Blanch NP.    Recent Visits No visits were found meeting these conditions. Showing recent visits within past 90 days and meeting all other requirements Today's Visits Date Type Provider Dept  06/10/24 Office Visit Travarius Lange K, NP [facility]  Showing today's visits and meeting all other requirements Future Appointments No visits were found meeting these conditions. Showing future appointments within next 90 days and meeting all other requirements  I discussed the assessment and treatment plan with the patient. The patient was provided an opportunity to ask questions and all were answered. The patient agreed with the plan and demonstrated an understanding of the instructions.  Patient advised to call back or seek an in-person evaluation if the symptoms or condition worsens.  I personally spent a total of 30 minutes in the care of the patient today including preparing to see the patient, getting/reviewing separately obtained history, performing a medically appropriate exam/evaluation, counseling and educating, placing orders, referring and communicating with other health care professionals, documenting clinical information in the EHR, independently interpreting results, communicating results, and coordinating  care.   Note by: Adelita Hone K Louine Tenpenny, NP (TTS and AI technology used. I apologize for any typographical errors that were not detected and corrected.) Date: 06/10/2024; Time: 2:22 PM

## 2024-06-10 NOTE — Telephone Encounter (Signed)
 Submitted a Prior Authorization request to [employer] for DUPIXENT via CoverMyMeds. Will update once we receive a response.  Key: [id number]

## 2024-06-11 ENCOUNTER — Ambulatory Visit: Admitting: Physician Assistant

## 2024-06-11 VITALS — BP 152/75 | HR 73

## 2024-06-11 DIAGNOSIS — N3946 Mixed incontinence: Secondary | ICD-10-CM

## 2024-06-11 NOTE — Progress Notes (Signed)
 PTNS  Session # 23  Health & Social Factors: no change Caffeine: 2 Alcohol: 0 Daytime voids #per day: 3 Night-time voids #per night: 2 Urgency: mild Incontinence Episodes #per day: 2 Ankle used: right Treatment Setting: 6 Feeling/ Response: both Comments: Patient tolerated well.  Performed By: Corrinne Benegas, PA-C   Follow Up: 1 month

## 2024-06-11 NOTE — Progress Notes (Addendum)
 Maria PARAS.  [name], date of birth 07-25-1955, was seen for Phase 3 Open -Label extension study to evaluate the long-term safety of Astegolimab .in patients with chronic obstructive pulmonary disease. No exam was mandated by protocol. Protocol deviation of subject being underdosed resulted as staff member experienced needlestick as the needle was being removed from the protective cap.  1 dose of Astegolimab was administered into the right upper quadrant without complication.                                                                                                                                [name], MD, SI

## 2024-06-12 ENCOUNTER — Other Ambulatory Visit (HOSPITAL_COMMUNITY): Payer: Self-pay

## 2024-06-12 ENCOUNTER — Ambulatory Visit
Admission: RE | Admit: 2024-06-12 | Discharge: 2024-06-12 | Disposition: A | Discharge status: Home / Self Care | Source: Ambulatory Visit | Attending: Nurse Practitioner | Admitting: Nurse Practitioner

## 2024-06-12 DIAGNOSIS — Z13820 Encounter for screening for osteoporosis: Secondary | ICD-10-CM | POA: Insufficient documentation

## 2024-06-12 DIAGNOSIS — Z1231 Encounter for screening mammogram for malignant neoplasm of breast: Secondary | ICD-10-CM | POA: Diagnosis present

## 2024-06-12 DIAGNOSIS — Z780 Asymptomatic menopausal state: Secondary | ICD-10-CM | POA: Diagnosis present

## 2024-06-12 NOTE — Telephone Encounter (Signed)
 Received notification from [facility] regarding a prior authorization for DUPIXENT. Authorization has been APPROVED from 06/10/2024 to 12/08/2024. Approval letter sent to scan center.  Per test claim, copay for 28 days supply (4mL) is $1201.35  Authorization # [id number]  Will need ot submit PAP

## 2024-06-17 ENCOUNTER — Other Ambulatory Visit: Payer: Self-pay

## 2024-06-17 ENCOUNTER — Other Ambulatory Visit (HOSPITAL_COMMUNITY): Payer: Self-pay

## 2024-06-18 DIAGNOSIS — M8580 Other specified disorders of bone density and structure, unspecified site: Secondary | ICD-10-CM | POA: Insufficient documentation

## 2024-06-18 NOTE — Telephone Encounter (Signed)
 Received Dupixent new start paperwork. Completed form and faxed with insurance card, med list and pa approval letter to [employer]. Will update when we receive a response.  Phone #: [phone] Fax #: [phone]

## 2024-06-19 ENCOUNTER — Other Ambulatory Visit (HOSPITAL_COMMUNITY): Payer: Self-pay

## 2024-06-19 ENCOUNTER — Encounter

## 2024-06-19 DIAGNOSIS — Z8709 Personal history of other diseases of the respiratory system: Secondary | ICD-10-CM

## 2024-06-19 DIAGNOSIS — Z006 Encounter for examination for normal comparison and control in clinical research program: Secondary | ICD-10-CM

## 2024-06-19 MED ORDER — STUDY - ARNASA GB43374 (OPEN-LABEL) - ASTEGOLIMAB 238 MG/1.7 ML SQ INJECTION (PI-RAMASWAMY)
476.0000 mg | INJECTION | SUBCUTANEOUS | Status: AC
  Administered 2024-05-22: 476 mg via SUBCUTANEOUS

## 2024-06-19 MED ORDER — STUDY - ARNASA GB43374 (OPEN-LABEL) - ASTEGOLIMAB 238 MG/1.7 ML SQ INJECTION (PI-RAMASWAMY)
476.0000 mg | INJECTION | SUBCUTANEOUS | Status: AC
  Administered 2024-06-19: 476 mg via SUBCUTANEOUS
  Filled 2024-06-19: qty 3.4

## 2024-06-19 NOTE — Addendum Note (Signed)
 Addended by: Terrence Pizana C on: 06/19/2024 01:00 PM   Modules accepted: Orders

## 2024-06-19 NOTE — Research (Signed)
 Maria [name] ([id number]) Epic Progress Note  Title: A phase III Open-Label extension study to evaluate the long-term safety of Astegolimab in patients with Chronic Obstructive Pulmonary disease.   Dose and Duration of Treatment: Astegolimab is presented as a sterile, slightly brown-yellow solution. Each single-use, 2.25 mL pre-filled syringe contains 1.7 mL deliverable volume. Astegolimab drug product is formulated at 140 mg/mL astegolimab with 114 mM succinic acid, 200 mM L-arginine, 10 mM L-methionine, 0.06% (w/v) polysorbate 20, pH 5.7. Placebo for astegolimab is supplied in an identical pre-filled syringe configuration.   Protocol # [id number] Sponsor: [employer] [address] [place]   Protocol Version: v3 dated 15Oct2024 IB: version 9.0 dated April 2024 ICF:  ICFs: Make sure that the subject is re-consented with the updated ICFs Main ICF: [facility] [facility] Approved Version 15 Jun 2023, Revised 28Nov2024 Mobile Nursing: [employer] [facility] Approved Version 26Oct2023, Revised 08Aug2024 Optional Home Study Treatment: IRB Approved Version 12 May 2022 Revised 23 Feb 2023 ADDENDUM 1: IRB Approved Version 24Jul2024 Revised 8Aug2024 Lab Manual: V2.1.0 dated 21Jun2024   Mechanism of action: Astegolimab (also known as [id number] or [id number]) is a fully human, IgG2 monoclonal antibody that binds with high affinity to the interleukin (IL)-33 (IL-33) receptor, ST2, thereby blocking the signaling of IL-33, an inflammatory cytokine of the IL-1 family and member of the alarmin class of molecules. Astegolimab binds with high affinity to the human and cynomolgus monkey receptor for IL-33, ST2, and blocks IL-33 binding, thus inhibiting association with the IL-1R accessory protein (AcP) co-receptor and formation of an activated receptor complex.   Key Inclusion Criteria: Able and willing to provide written informed consent and to comply with the study protocol. Ability to comply with the  requirements of study protocol, according to the investigators's best judgement. Completion of the 52-week treatment period in either [place] or Study HA55667.   Key Exclusion Criteria: Significant non- compliance in the parent study, specifically defined as missing scheduled visits, per investigators judgement. Any new clinically significant pulmonary disease other than COPD since enrolling in the study. Unstable cardiac disease, myocardial infarction, or [place]  Heart Association Class III or IV heart failure since enrolling in the parent study.   Integrated Pharmacokinetic/Pharmacodynamic Analysis:  In Studies HA60757, K3610841, and [id number], exploratory biomarker analysis showed there were consistent decreases in blood eosinophil counts throughout the treatment period, potentially mediated by a direct effect of IL-33 on eosinophil progenitors. In [place], there was no significant difference in fractional exhaled nitric oxide (FeNO) levels (reflecting airway IL-4/IL-13 activity) between astegolimab-treated groups relative to placebo throughout the treatment period. These data suggest that astegolimab has only a limited effect on Type 2 inflammation in asthma. No data are applicable for Study HJ57530. No data are available yet for Study HA56688.   Special Warnings/Considerations: Administration of astegolimab, a protein therapeutic, may lead to the development of anti-astegolimab antibodies which could lead to AEs and/or decreased exposure. In non-clinical studies (see Section 4.2), ADA incidence has generally been low and there was no apparent ADA impact on PK and safety in these studies. To date, the immunogenicity rates observed with astegolimab in clinical studies have been relatively low as well (see Immunogenicity Section 5.6). Several clinical studies have been conducted in patients with asthma, atopic dermatitis, COPD (IIS Study HA59431) and COVID-19 severe pneumonia, which generally  showed low incidence of ADAs (Table 31). There has been no correlation between ADA status and clinical findings or increased incidence of AEs. Astegolimab is now being considered in  a larger study for the treatment of COPD. This patient population is typically considered to have a hyper-responsive immune system. Route of administration for this molecule will be [place], either Q2W or Q4W. These factors increase the risk of development of an immune response to astegolimab, specifically with repeat dosing. From the previous IIS Study HA59431, incidence of ADAs was low in COPD patients; this remains to be confirmed in a larger study. To monitor ADA development in ongoing studies, serum samples will be collected from patients at protocol-defined intervals. Patients who test positive for antibodies and have clinical sequelae that are considered potentially related to an ADA response may also be asked to return for additional follow-up testing.   Drug Interaction Studies: No PK drug interaction studies have been conducted to date.   Serious Adverse Reactions Observed in Asthma Study: During the treatment period, a similar proportion of patients across all cohorts experienced at least one AE, regardless of causality (Table 22). In total, 77.2%, 70.9%, 72.2%, and 72.1% of patients reported at least 1 AE in the placebo, 70 mg, 210 mg, and 490 mg groups, respectively. The most common AE's (>5% in any treatment group) were asthma, nasopharyngitis, upper respiratory tract infection, headache, and injection site reaction (ISR). The most common drug-related AE was ISR, which was reported more frequently in the astegolimab treatment groups than in the placebo group (1 [0.8%] patient in the placebo group, 10 [7.9%] patients with 70 mg, 8 [6.3%] patients with 210 mg, 6 [4.9%] patients with 490 mg). All ISRs were non-serious and mild or moderate in severity. During the treatment period, 50 SAEs were reported in 37 (7.4%) patients.  The number of patients reporting SAEs was comparable across all cohorts (11 SAEs in 8 patients on placebo, 21 SAEs in 14 patients on 70 mg, 11 SAEs in 9 patients on 210 mg, and 7 SAEs in 6 patients on 490 mg). The most common SAE was asthma. One SAE of moderate livedo reticularis (70 mg) was considered a suspected unexpected serious adverse drug reaction (SUSAR) related to astegolimab and was reported two days after the second dose, leading to discontinuation of astegolimab. Two (0.4%) patients reported anaphylaxis and hypersensitivity reactions: 1 severe SAE of anaphylactic reaction (placebo), and 1 moderate hypersensitivity (490 mg) considered related to astegolimab. Three (0.6%) patients experienced a potential Major Adverse Cardiac Event (MACE) (1 patient each in the placebo, 70 mg, and 210 mg groups). None of the potential MACE were considered related to astegolimab. Overall, 233 (46.4%) patients reported events of infection. A comparable number of patients reported infection across all treatment groups (65 [51.2%] patients on placebo, 55 [43.3%] patients on 70 mg, 58 [46.0%] patients on 210 mg, and 55 [45.1%] patients on 490 mg). The most frequently reported infection (>=10% incidence) was nasopharyngitis (12.7%). One patient on astegolimab 210 mg and the partner of one patient on placebo became pregnant during the study. Both delivered normal/healthy babies. Two deaths, unrelated to study drug, were reported: one patient on 210 mg astegolimab died following an SAE of asthma; the other patient on 490 mg astegolimab had an unexplained death. There were no clinically meaningful changes in laboratory parameters, vital signs, or ECG results, other than the 10% decrease in mean blood eosinophil counts in the astegolimab-treated groups, with no safety concerns. Treatment-induced ADAs were comparable between the astegolimab groups and had no impact on safety. Overall, astegolimab was well tolerated at all doses  used and had a safety profile consistent with that observed in the previous  astegolimab Phase I studies.   Serious Adverse Reactions Observed in Previous COPD Study: In the completed IIS Study HA59431, a total of 81 patients received at least one dose of astegolimab or placebo. The safety profile of astegolimab was similar to that of placebo. There were a total of 222 AEs reported in 62 patients. A total of 28 (72%) patients in the placebo arm and 34 (81%) patients in the astegolimab arm reported at least one AE. The most commonly reported AEs were headache followed by urinary tract infection and viral upper respiratory tract infection. A total of 39 SAEs were reported in 28 (35%) patients; 16 (41%) patients in the placebo group and 12 (29%) in the astegolimab group. The most commonly reported SAE was hospital admission for community acquired pneumonia. Four SAEs resulted in patient discontinuation from treatment (1 patient in the placebo group and 3 patients in the astegolimab group). One patient in the placebo group experienced an AESI of potential MACE (heart failure, unrelated to the study treatment). No anaphylaxis or pregnancies were reported. Two deaths, unrelated to study treatment, were reported: 1 patient in the placebo group died after hospital acquired pneumonia, and another patient in the placebo group died after pneumonia and type 2 respiratory failure.   Safety Data: Astegolimab has been generally well tolerated. There have been 49 patient deaths across all astegolimab studies (Section 5.5.2), none of which were considered related to astegolimab. A total of 144 subjects experienced a total of 224 SAEs across all astegolimab studies. Of these, an SAE livedo reticularis observed in 1 patient was considered related to astegolimab by the investigator (Section 5.5.3). AEs leading to withdrawal (Section 5.5.4) have generally occurred at a rate similar to what would be expected for clinical trials in the  studies' respective indications.   [facility] [place] Clinical Research Coordinator note:    This visit for Subject: [id number] DOB: 07/07/1956 on 03Dec2025 for the above protocol is Visit/Encounter # week 32 and is for purpose of research.    Subject expressed continued interest and consent in continuing as a study subject. Subject confirmed that there was no change in contact information (e.g. address, telephone, email). Subject thanked for participation in research and contribution to science. The Subject was informed that the PI Dr. Geronimo continues to have oversight of the subject's visits and course through relevant discussions, reviews and specifically of this visit by routing of this note to the PI.  All assessments and procedures were performed per above stated protocol. Subject tolerated injections well. Please refer to subject's paper source binder for further details of this visit.   Signed by Octaviano Paras, CCRP, COT Clinical Research Coordinator  [facility]  [place], [place]

## 2024-06-19 NOTE — Research (Deleted)
 Patient tolerated 2 injections with no reactions or 03Dec2025 LLQ 1.7 at 09:55 RLQ 1.7 at 09:56

## 2024-06-23 ENCOUNTER — Other Ambulatory Visit: Payer: Self-pay | Admitting: Internal Medicine

## 2024-06-23 ENCOUNTER — Other Ambulatory Visit: Payer: Self-pay | Admitting: Nurse Practitioner

## 2024-06-23 DIAGNOSIS — G479 Sleep disorder, unspecified: Secondary | ICD-10-CM

## 2024-06-24 ENCOUNTER — Telehealth: Payer: Self-pay | Admitting: Internal Medicine

## 2024-06-24 ENCOUNTER — Ambulatory Visit: Admitting: Internal Medicine

## 2024-06-24 ENCOUNTER — Ambulatory Visit: Payer: Self-pay | Admitting: Internal Medicine

## 2024-06-24 ENCOUNTER — Encounter: Payer: Self-pay | Admitting: Internal Medicine

## 2024-06-24 VITALS — BP 151/84 | HR 83 | Temp 97.7°F | Ht 65.5 in | Wt 173.8 lb

## 2024-06-24 DIAGNOSIS — J441 Chronic obstructive pulmonary disease with (acute) exacerbation: Secondary | ICD-10-CM

## 2024-06-24 DIAGNOSIS — R911 Solitary pulmonary nodule: Secondary | ICD-10-CM | POA: Diagnosis not present

## 2024-06-24 DIAGNOSIS — J449 Chronic obstructive pulmonary disease, unspecified: Secondary | ICD-10-CM

## 2024-06-24 LAB — CBC WITH DIFFERENTIAL/PLATELET
Basophils Absolute: 0 K/uL (ref 0.0–0.1)
Basophils Relative: 0.2 % (ref 0.0–3.0)
Eosinophils Absolute: 0.1 K/uL (ref 0.0–0.7)
Eosinophils Relative: 1.4 % (ref 0.0–5.0)
HCT: 38.4 % (ref 36.0–46.0)
Hemoglobin: 12.8 g/dL (ref 12.0–15.0)
Lymphocytes Relative: 25.6 % (ref 12.0–46.0)
Lymphs Abs: 2.6 K/uL (ref 0.7–4.0)
MCHC: 33.3 g/dL (ref 30.0–36.0)
MCV: 90.8 fl (ref 78.0–100.0)
Monocytes Absolute: 0.5 K/uL (ref 0.1–1.0)
Monocytes Relative: 5 % (ref 3.0–12.0)
Neutro Abs: 6.9 K/uL (ref 1.4–7.7)
Neutrophils Relative %: 67.8 % (ref 43.0–77.0)
Platelets: 342 K/uL (ref 150.0–400.0)
RBC: 4.23 Mil/uL (ref 3.87–5.11)
RDW: 13.2 % (ref 11.5–15.5)
WBC: 10.2 K/uL (ref 4.0–10.5)

## 2024-06-24 MED ORDER — METHYLPREDNISOLONE ACETATE 80 MG/ML IJ SUSP: 80.0000 mg | Freq: Once | INTRAMUSCULAR | Status: AC

## 2024-06-24 NOTE — Patient Instructions (Addendum)
 COPD excerbation ATypical chest pain  Plan  - stat albulterol x 1  - IM depotmedrol 80mg  x 1 - EKG x 1 stat - blood work cbc with diff, troponin - need new neb machine - go to ER if worse   Stage 4 very severe COPD by GOLD classification (HCC) Chronic respiratory failure with hypoxia (HCC) Severe chronic cough due to COPD  -Stable disease without flareup at this visit 06/03/2024 though despite study you have had your share of flare ups although  - Cough continues to be significant but baseline - oingoing ALNASA study injection Open Label Extension every 2 weeks - you qualify for DUpixent and othuvayre standard of care Treatment   -  now  Dupixent is FREE  Plan - continue oxygen  3-5 L - Continue - Breztri  scheduled - Continue research injections for now - MD has asked pharmacist to investigate Nucala or Dupixent - continue Hycodan cough syrup 5 mL twice daily for palliative relief of cough - Continue Robitussin as needed - Continue Tessalon  Perles as needed - consider ABG for hyprecapnia at next visit  Nodule of lower lobe of left lung - OCt 2025 - NEew. Low uptake on PET Oct 2025  - this might not be cancer  Plan  - repeat ct chest without contrast in Jan 2026   - CMA please ensure there is order   [place]  - Jan 2026  staandard of care visit with APP Camie after CT chest - keep q2 week research visit

## 2024-06-24 NOTE — Telephone Encounter (Signed)
 Received fax from [facility] stating rx has been triaged to [facility] [phone].

## 2024-06-24 NOTE — Telephone Encounter (Signed)
   FYI Only or Action Required?: FYI only for provider: appointment scheduled on 06/24/2024.  Patient is followed in Pulmonology for COPD, last seen on 06/19/2024 by Geronimo Amel, MD.  Called Nurse Triage reporting Cough and Shortness of Breath.  Symptoms began several days ago.  Interventions attempted: Prescription medications: tessalon  pearls, cough syrup, Rescue inhaler, and Maintenance inhaler.  Symptoms are: unchanged.  Triage Disposition: See HCP Within 4 Hours (Or PCP Triage)  Patient/caregiver understands and will follow disposition?: Yes  Copied from CRM #[id number]. Topic: Clinical - Red Word Triage >> Jun 24, 2024  9:24 AM Ismael A wrote: Red Word that prompted transfer to Nurse Triage:  pt is having a COPD flare up - difficulty breathing, pain in chest and ribs, coughing - states she feels miserable Reason for Disposition  Continuous (nonstop) coughing interferes with work, school, or sleeping  Answer Assessment - Initial Assessment Questions 1. RESPIRATORY STATUS: Describe your breathing? (e.g., wheezing, shortness of breath, unable to speak, severe coughing)      Severe non productive cough, wheezing 2. ONSET: When did this breathing problem begin?      Saturday, two days ago 3. PATTERN Does the difficult breathing come and go, or has it been constant since it started?      constant 4. SEVERITY: How bad is your breathing? (e.g., mild, moderate, severe)      mild   7. LUNG HISTORY: Do you have any history of lung disease?  (e.g., pulmonary embolus, asthma, emphysema)     COPD 8. CAUSE: What do you think is causing the breathing problem?      COPD exacerbation 9. OTHER SYMPTOMS: Do you have any other symptoms? (e.g., chest pain, cough, dizziness, fever, runny nose)     Cough, chest soreness from cough 10. O2 SATURATION MONITOR:  Do you use an oxygen  saturation monitor (pulse oximeter) at home? If Yes, ask: What is your reading (oxygen  level)  today? What is your usual oxygen  saturation reading? (e.g., 95%)       95%  Protocols used: Breathing Difficulty-A-AH

## 2024-06-24 NOTE — Telephone Encounter (Signed)
 Maria [name] Maria [name]  Has decided to do standard of care dupixent. Pleaes double check with PPD/Medical Monitor -> if this is not allowed, then arrange for end of study visit ASAP before she takes standard of care DUpixent     SIGNATURE    Dr. Dorethia Cave, M.D., F.C.C.P,  Pulmonary and Critical Care Medicine Staff Physician, [facility] Center Director - Interstitial Lung Disease  Program  Pulmonary Fibrosis [facility] [place], [place], [address]   Pager: [phone], If no answer  -> Check AMION or Try [phone] Telephone (clinical office): [phone] Telephone (research): [phone]  1:30 PM 06/24/2024

## 2024-06-24 NOTE — Telephone Encounter (Signed)
 Spoke to patient in-office. Educated that she will NOT start Dupixent until she has end of study visit. She and spouse verbalize understanding.

## 2024-06-24 NOTE — Progress Notes (Signed)
 OV 06/24/2024  Subjective:  Patient ID: Maria [name], female , DOB: February 09, 1956 , age 68 y.o. , MRN: [id number] , ADDRESS: [id number] [place] [facility] [id number] PCP [name], NP Patient Care Team: [name], NP as PCP - General (Nurse Practitioner) Ladona Heinz, MD as PCP - Cardiology (Cardiology)  This Provider for this visit: Treatment Team:  Attending Provider: Geronimo Amel, MD   Follow-up emphysema - alpha 1 MZ in dec 2020  -- On Trelegy for few years as of 2022.  Dose increased early 2022  -Insurance denied alpha-1 replacement in 2020 07/2020 Follow-up concern interstitial lung disease post COVID-19 in August 2020 -> feb 2021 Case conf ILD: NO ILD Follow-up chronic hypoxemic respiratory failure secondary to cp[d  -ABG without hypercapnia April 2023 Ex smoker 40 pack quit Echo sept 202 0 - ef 65% and diast dysfn Last CT scan of the chest February 2022 -> sep 2023   - emphysema +. No cancer  Chronic coough since jan 2022 -on gabapentin    06/24/2024 -   Chief Complaint  Patient presents with   Acute Visit    Pt states she is having a lot of pressure in chest, can hardly breathe, pain around rib 2 days now  Prod ( phlegm clear) and Dry cough  SOB occurs w/ any activity  Pt is on 4L of poc     HPI Maria [name] 68 y.o. -Maria [name] is a 68 year old female with COPD who presents with chest pain and shortness of breath.  She has been experiencing chest pain described as feeling like 'a ton of bricks' on her chest since Saturday. The pain is present both at rest and with exertion, radiating around her chest, and has been intermittent, with periods of relief followed by worsening.  She feels the symptoms consistent with COPD exacerbation.  In review of the external medical records indicates that in May 2025 she did have cardiac stress test that was low risk.  She also had an EKG at that time that looked normal to me.  But she does have carotid  atherosclerotic disease and she has had carotid stents. There is an increase in shortness of breath compared to a few weeks ago. Her cough remains unchanged, and her phlegm is more clear than before. She has not been using her nebulizer since it broke on Friday.  She needs a new nebulizer.  She is currently taking a research drug and has not started Dupixent due to cost concerns. She is awaiting a response from the pharmacy regarding a potential price reduction.  I personally spoke to Maria [name] the pharmacist and it is now free.  Therefore she will start Dupixent.  Because of this she need to stop the study drug and come off the study.  I have communicated this with the study coordinator via epic phone message and also secure chat.  EKG done 06/24/24 - visualized in office - NIL ACUTE   PFT     Latest Ref Rng & Units 08/29/2022    8:29 AM 11/12/2020   10:43 AM 01/16/2020   10:57 AM 06/24/2019    8:45 AM  PFT Results  FVC-Pre L 2.79  2.94  3.17  2.93   FVC-Predicted Pre % 83  84  90  83   FVC-Post L  3.18  3.25  3.39   FVC-Predicted Post %  91  92  96   Pre FEV1/FVC % %  57  54  55  56   Post FEV1/FCV % %  54  56  59   FEV1-Pre L 1.60  1.60  1.75  1.64   FEV1-Predicted Pre % 62  59  65  60   FEV1-Post L  1.71  1.83  1.99   DLCO uncorrected ml/min/mmHg 9.85  8.06  10.81  12.24   DLCO UNC% % 47  37  50  57   DLCO corrected ml/min/mmHg 9.85  8.06  11.29    DLCO COR %Predicted % 47  37  52    DLVA Predicted % 50  57  61  60   TLC L  4.89  5.25  5.81   TLC % Predicted %  91  98  108   RV % Predicted %  88  86  110        LAB RESULTS last 96 hours No results found.       has a past medical history of Anginal pain, Anxiety, Atherosclerosis ([id number]), Blood transfusion without reported diagnosis (2005), Cancer (HCC) (Ovarian 1978), Chest pain, COPD (chronic obstructive pulmonary disease) (HCC) (2019), Coronary artery disease, COVID-19 long hauler manifesting chronic dyspnea,  Depression (December), Dyspnea, Emphysema of lung (HCC) (2019), Oxygen  deficiency (Sept 2020), Oxygen  dependent, Peripheral vascular disease, and Pneumonia.   reports that she quit smoking about 8 years ago. Her smoking use included cigarettes. She started smoking about 53 years ago. She has a 45 pack-year smoking history. She has been exposed to tobacco smoke. She has never used smokeless tobacco.  Past Surgical History:  Procedure Laterality Date   ABDOMINAL HYSTERECTOMY  1978   BREAST BIOPSY Left 04/04/2023   Stereo bx, X clip, BENIGN BREAST PARENCHYMA WITH STROMAL FIBROSIS, FOCAL FIBROADENOMATOID CHANGE AND ASSOCIATED MICROCALCIFICATIONS - NEGATIVE FOR MALIGNANCY.   BREAST BIOPSY Left 04/04/2023   MM LT BREAST BX W LOC DEV 1ST LESION IMAGE BX SPEC STEREO GUIDE 04/04/2023 [facility]   CAROTID PTA/STENT INTERVENTION Left 03/07/2023   Procedure: CAROTID PTA/STENT INTERVENTION;  Surgeon: Jama Cordella MATSU, MD;  Location: [facility] INVASIVE CV LAB;  Service: Cardiovascular;  Laterality: Left;   CHOLECYSTECTOMY  2010   COLONOSCOPY     COLONOSCOPY     COLONOSCOPY WITH PROPOFOL  N/A 11/01/2021   Procedure: COLONOSCOPY WITH PROPOFOL ;  Surgeon: Therisa Bi, MD;  Location: [facility]COPY;  Service: Gastroenterology;  Laterality: N/A;   ESOPHAGOGASTRODUODENOSCOPY     NECK SURGERY  1999   Trach   OVARIAN CYST REMOVAL Bilateral 1988   SPINE SURGERY  Neck 2002   TRANSCAROTID ARTERY REVASCULARIZATION  Left 01/12/2024   Procedure: TRANSCAROTID ARTERY REVASCULARIZATION (TCAR);  Surgeon: Serene Gaile ORN, MD;  Location: [facility] OR;  Service: Vascular;  Laterality: Left;    Allergies  Allergen Reactions   Montelukast      Bad dreams    Immunization History  Administered Date(s) Administered   Fluad Quad(high Dose 65+) 03/30/2022   INFLUENZA, HIGH DOSE SEASONAL PF 03/30/2022, 03/27/2023, 03/29/2024   Influenza, Seasonal, Injecte, Preservative Fre 04/28/2008, 04/09/2010   Influenza,inj,Quad PF,6+ Mos  04/22/2019, 04/25/2020, 03/15/2021   Influenza-Unspecified 06/01/2008, 05/10/2016, 03/18/2017   Moderna Sars-Covid-2 Vaccination 07/30/2019, 08/28/2019, 06/27/2020   PNEUMOCOCCAL CONJUGATE-20 07/06/2022   Pfizer Covid-19 Vaccine Bivalent Booster 28yrs & up 07/28/2021   Pneumococcal Conjugate-13 06/03/2024   Pneumococcal Polysaccharide-23 04/15/2015, 04/22/2020   RSV,unspecified 07/06/2022   Respiratory Syncytial Virus Vaccine,Recomb Aduvanted(Arexvy) 05/30/2022   Td 10/01/2020   Tdap 11/17/2009, 03/27/2023   Zoster Recombinant(Shingrix) 04/16/2020, 06/22/2020    Family History  Problem Relation Age of Onset   Heart failure Mother    Diabetes Mother    Kidney disease Mother    Hypertension Mother    Arthritis Mother    Heart disease Mother    Obesity Mother    COPD Father    Hearing loss Father    Lung cancer Brother    COPD Brother    COPD Brother    Asthma Son    Diabetes Son    Birth defects Son    Breast cancer Maternal Aunt 40     Current Outpatient Medications:    albuterol  (VENTOLIN  HFA) 108 (90 Base) MCG/ACT inhaler, Inhale 2 puffs into the lungs every 6 (six) hours as needed for wheezing or shortness of breath., Disp: 6.7 g, Rfl: 3   ASPIRIN  LOW DOSE 81 MG EC tablet, TAKE 1 TABLET BY MOUTH EVERY DAY, Disp: 90 tablet, Rfl: 3   atorvastatin  (LIPITOR) 20 MG tablet, Take 1 tablet (20 mg total) by mouth daily., Disp: 90 tablet, Rfl: 1   Azelastine  HCl 137 MCG/SPRAY SOLN, USE 1 SPRAY INTO EACH NOSTRIL TWICE A DAY, Disp: 30 mL, Rfl: 1   benzonatate  (TESSALON ) 200 MG capsule, Take 1 capsule (200 mg total) by mouth 3 (three) times daily as needed for cough., Disp: 90 capsule, Rfl: 0   Budeson-Glycopyrrol-Formoterol  (BREZTRI  AEROSPHERE) 160-9-4.8 MCG/ACT AERO, Inhale 2 puffs into the lungs in the morning and at bedtime., Disp: 3 each, Rfl: 3   clopidogrel  (PLAVIX ) 75 MG tablet, Take 1 tablet (75 mg total) by mouth daily., Disp: 90 tablet, Rfl: 3   escitalopram  (LEXAPRO ) 20 MG  tablet, Take 1 tablet (20 mg total) by mouth daily., Disp: 90 tablet, Rfl: 1   gabapentin  (NEURONTIN ) 600 MG tablet, Take 1 tablet (600 mg total) by mouth every 8 (eight) hours., Disp: 90 tablet, Rfl: 5   HYDROcodone  bit-homatropine (HYCODAN) 5-1.5 MG/5ML syrup, Take 5 mLs by mouth 2 (two) times daily as needed for cough., Disp: 300 mL, Rfl: 0   hydrOXYzine  (VISTARIL ) 25 MG capsule, TAKE 1 CAPSULE BY MOUTH AT BEDTIME AS NEEDED, Disp: 90 capsule, Rfl: 0   isosorbide  mononitrate (IMDUR ) 30 MG 24 hr tablet, Take 1 tablet (30 mg total) by mouth every morning., Disp: 30 tablet, Rfl: 11   Multiple Vitamins-Minerals (MULTIVITAMIN WITH MINERALS) tablet, Take 1 tablet by mouth daily. Alive Woman 50+, Disp: , Rfl:    nitroGLYCERIN  (NITROSTAT ) 0.4 MG SL tablet, Place 1 tablet (0.4 mg total) under the tongue every 5 (five) minutes as needed., Disp: 25 tablet, Rfl: 1   OVER THE COUNTER MEDICATION, Place 1 drop into both eyes daily as needed (Dry and itching eyes). Thera Tears, Disp: , Rfl:    oxybutynin  (DITROPAN -XL) 10 MG 24 hr tablet, Take 1 tablet (10 mg total) by mouth daily., Disp: 90 tablet, Rfl: 3   OXYGEN , Inhale 3-5 L into the lungs continuous. 3-4 liters at rest and 5 liters with any activity.-, Disp: , Rfl:    Respiratory Therapy Supplies (FLUTTER) DEVI, Use as directed, Disp: 1 each, Rfl: 0   roflumilast  (DALIRESP ) 500 MCG TABS tablet, Take 1 tablet by mouth once daily, Disp: 30 tablet, Rfl: 0   sertraline  (ZOLOFT ) 50 MG tablet, Take 1 tablet by mouth once daily, Disp: 90 tablet, Rfl: 0   sodium chloride  HYPERTONIC 3 % nebulizer solution, Take by nebulization 2 (two) times daily., Disp: 750 mL, Rfl: 12   ipratropium-albuterol  (DUONEB) 0.5-2.5 (3) MG/3ML SOLN, Take 1 vial (3 mLs) by nebulization  4 (four) times daily as needed. (Patient not taking: Reported on 06/24/2024), Disp: 360 mL, Rfl: 0  Current Facility-Administered Medications:    Study - ARNASA [id number] (open-label) - astegolimab 238 mg/1.7 mL  SQ injection (PI-Jaielle Dlouhy), 476 mg, Subcutaneous, Q14 Days, , 476 mg at 01/03/24 0955   Study - ARNASA [id number] (open-label) - astegolimab 238 mg/1.7 mL SQ injection (PI-Jomo Forand), 476 mg, Subcutaneous, Q14 Days, , 476 mg at 01/17/24 1006   Study - ARNASA [id number] (open-label) - astegolimab 238 mg/1.7 mL SQ injection (PI-Kavita Bartl), 476 mg, Subcutaneous, Q14 Days, , 476 mg at 01/31/24 1141   Study - ARNASA [id number] (open-label) - astegolimab 238 mg/1.7 mL SQ injection (PI-Porche Steinberger), 476 mg, Subcutaneous, Q14 Days, , 476 mg at 02/28/24 1002   Study - ARNASA [id number] (open-label) - astegolimab 238 mg/1.7 mL SQ injection (PI-Elfreda Blanchet), 476 mg, Subcutaneous, Q14 Days, , 476 mg at 03/13/24 1002   Study - ARNASA [id number] (open-label) - astegolimab 238 mg/1.7 mL SQ injection (PI-Andrell Tallman), 476 mg, Subcutaneous, Q14 Days, , 476 mg at 03/27/24 1019   Study - ARNASA [id number] (open-label) - astegolimab 238 mg/1.7 mL SQ injection (PI-Vivian Okelley), 476 mg, Subcutaneous, Q14 Days, , 476 mg at 04/10/24 0856   Study - ARNASA [id number] (open-label) - astegolimab 238 mg/1.7 mL SQ injection (PI-Boluwatife Mutchler), 476 mg, Subcutaneous, Q14 Days, , 476 mg at 04/24/24 0844   Study - ARNASA [id number] (open-label) - astegolimab 238 mg/1.7 mL SQ injection (PI-Valencia Kassa), 476 mg, Subcutaneous, Q14 Days, , 238 mg at 06/05/24 1101   Study - ARNASA [id number] (open-label) - astegolimab 238 mg/1.7 mL SQ injection (PI-Normand Damron), 476 mg, Subcutaneous, Q14 Days, , 476 mg at 06/19/24 1046   Study - ARNASA [id number] (open-label) - astegolimab 238 mg/1.7 mL SQ injection (PI-Kierstan Auer), 476 mg, Subcutaneous, Q14 Days, , 476 mg at 05/22/24 0930   Study - ARNASA [id number] - astegolimab 238 mg/1.7 mL or placebo SQ injection (PI-Ly Wass), 476 mg, Subcutaneous, Q14 Days,    Study - ARNASA [id number] - astegolimab 238 mg/1.7 mL or placebo SQ injection (PI-Victory Dresden), 476 mg, Subcutaneous, Q14 Days, , 476 mg at 09/12/23 0930      Objective:   Vitals:   06/24/24  1307  BP: (!) 151/84  Pulse: 83  Temp: 97.7 F (36.5 C)  TempSrc: Oral  SpO2: 94%  Weight: 173 lb 12.8 oz (78.8 kg)  Height: 5' 5.5 (1.664 m)    Estimated body mass index is 28.48 kg/m as calculated from the following:   Height as of this encounter: 5' 5.5 (1.664 m).   Weight as of this encounter: 173 lb 12.8 oz (78.8 kg).  @WEIGHTCHANGE @  [employer]   06/24/24 1307  Weight: 173 lb 12.8 oz (78.8 kg)     Physical Exam   General: No distress. Looks same O2 at rest: yes Rexford present: no Sitting in wheel chair: no Frail: no Obese: no Neuro: Alert and Oriented x 3. GCS 15. Speech normal Psych: Pleasant Resp:  Barrel Chest - yes.  Wheeze - no, Crackles - no, No overt respiratory distress CVS: Normal heart sounds. Murmurs - no Ext: Stigmata of Connective Tissue Disease - no HEENT: Normal upper airway. PEERL +. No post nasal drip        Assessment/     Assessment & Plan COPD with acute exacerbation (HCC)  Stage 4 very severe COPD by GOLD classification (HCC)  Nodule of lower lobe of left lung    PLAN Patient Instructions  COPD excerbation ATypical chest pain  Plan  - stat albulterol  x 1  - IM depotmedrol 80mg  x 1 - EKG x 1 stat - blood work cbc with diff, troponin - need new neb machine - go to ER if worse   Stage 4 very severe COPD by GOLD classification (HCC) Chronic respiratory failure with hypoxia (HCC) Severe chronic cough due to COPD  -Stable disease without flareup at this visit 06/03/2024 though despite study you have had your share of flare ups although  - Cough continues to be significant but baseline - oingoing ALNASA study injection Open Label Extension every 2 weeks - you qualify for DUpixent and othuvayre standard of care Treatment   -  now  Dupixent is FREE  Plan - continue oxygen  3-5 L - Continue - Breztri  scheduled - Continue research injections for now - MD has asked pharmacist to investigate Nucala or Dupixent -  continue Hycodan cough syrup 5 mL twice daily for palliative relief of cough - Continue Robitussin as needed - Continue Tessalon  Perles as needed - consider ABG for hyprecapnia at next visit  Nodule of lower lobe of left lung - OCt 2025 - NEew. Low uptake on PET Oct 2025  - this might not be cancer  Plan  - repeat ct chest without contrast in Jan 2026   - CMA please ensure there is order   Fllowup  - Jan 2026  staandard of care visit with APP Camie after CT chest - keep q2 week research visit    FOLLOWUP    Return for Jan 2026  staandard of care visit with APP Camie after CT chest.    SIGNATURE    Dr. Dorethia Cave, M.D., F.C.C.P,  Pulmonary and Critical Care Medicine Staff Physician, [facility] Center Director - Interstitial Lung Disease  Program  Pulmonary Fibrosis [facility] at [facility] [place], [place], [address]  Pager: [phone], If no answer or between  15:00h - 7:00h: call [phone] Telephone: [phone]  1:51 PM 06/24/2024

## 2024-06-24 NOTE — Telephone Encounter (Signed)
 Order was sent today at [facility] for neb

## 2024-06-24 NOTE — Telephone Encounter (Signed)
 PAS unable to transfer call. Attempted to contact patient x 1 to discuss symptoms; LVM to return call, Will attempt to contact patient at a later time to further discuss concerns.         Copied from CRM #[id number]. Topic: Clinical - Red Word Triage >> Jun 24, 2024  9:24 AM Ismael A wrote: Red Word that prompted transfer to Nurse Triage:  pt is having a COPD flare up - difficulty breathing, pain in chest and ribs, coughing - states she feels miserable

## 2024-06-25 ENCOUNTER — Other Ambulatory Visit (HOSPITAL_COMMUNITY): Payer: Self-pay

## 2024-06-25 ENCOUNTER — Encounter: Payer: Self-pay | Admitting: Internal Medicine

## 2024-06-27 ENCOUNTER — Encounter: Payer: Self-pay | Admitting: Cardiology

## 2024-06-27 LAB — TROPONIN T, HIGH SENSITIVITY (HS-TNT): Troponin T (Highly Sensitive): <6 ng/L (ref <15)

## 2024-06-28 ENCOUNTER — Other Ambulatory Visit: Payer: Self-pay

## 2024-06-28 ENCOUNTER — Telehealth: Payer: Self-pay

## 2024-06-28 ENCOUNTER — Telehealth: Payer: Self-pay | Admitting: *Deleted

## 2024-06-28 ENCOUNTER — Ambulatory Visit: Payer: Self-pay | Admitting: Internal Medicine

## 2024-06-28 DIAGNOSIS — J9611 Chronic respiratory failure with hypoxia: Secondary | ICD-10-CM

## 2024-06-28 DIAGNOSIS — J441 Chronic obstructive pulmonary disease with (acute) exacerbation: Secondary | ICD-10-CM

## 2024-06-28 MED ORDER — BREZTRI AEROSPHERE 160-9-4.8 MCG/ACT IN AERO: 2.0000 | INHALATION_SPRAY | Freq: Two times a day (BID) | RESPIRATORY_TRACT | 3 refills | Status: DC

## 2024-06-28 NOTE — Telephone Encounter (Signed)
 Copied from CRM #[id number]. Topic: Clinical - Order For Equipment >> Jun 28, 2024  1:43 PM Joesph PARAS wrote: Reason for CRM: Patient is calling to state that she has not gotten any notice about the nebulizer. Patient would like order to e placed to [facility] ASAP.   Spoke with patient new order place to [facility]

## 2024-06-28 NOTE — Telephone Encounter (Signed)
 Breztri  PAP forms signed by MR and faxed to [place] [facility] [place]. Confirmation received and placed forms in folder to be scanned. Rx sent electronically to [facility]

## 2024-07-01 NOTE — Telephone Encounter (Signed)
 Called Dupixent [employer] to check status of application. Per representative, they were unable to move forward with application as they were unable to complete benefits investigation with insurance due to third party restrictions.   I advised that we had prior authorization approval on file and test claim >$1000. Per representative, our office can fax PA approval for their records and to move along the process.   Called patient to discuss. Patient reports she called and expressed financial hardship. Our office will fax PA approval. Will await response from [employer].

## 2024-07-02 NOTE — Telephone Encounter (Signed)
 [name], [address], [place]; [name], [name]; Nickola Stabs; Able, Jada Good morning Pulled nebulizer order, submitted for auth approval, once approval received from insurance will dispense equipment.  When sending messages please also add Jada Able.  Thank you!       Previous Messages    ----- Message ----- From: Render Sherlean SAILOR Sent: 07/02/2024  10:40 AM EST To: Sonny Ee; [facility]; Stabs [place]* Subject: Urgent:New Neb                                [birth year] Order placed please advise. Thanks!

## 2024-07-02 NOTE — Telephone Encounter (Signed)
 Order has been sent as urgent to [facility].

## 2024-07-02 NOTE — Telephone Encounter (Signed)
>>   Jul 01, 2024  2:05 PM Joesph PARAS wrote: Patient is very upset, as Kimber states that they have not gotten the order from us  yet and patient feels this is an emergency. She has not had a nebulizer in over 10 days and counting. Patient is requesting we contact [facility] and rush this order.    [facility] can we check on the order that was placed 12/12 and send this over to [facility].

## 2024-07-03 ENCOUNTER — Encounter

## 2024-07-03 DIAGNOSIS — Z006 Encounter for examination for normal comparison and control in clinical research program: Secondary | ICD-10-CM

## 2024-07-04 ENCOUNTER — Encounter: Payer: Self-pay | Admitting: Internal Medicine

## 2024-07-04 NOTE — Progress Notes (Signed)
 Maria [name], DOB 1956/06/06,s seen as baseline.  Subject in a clinical trial /Protocol # [id number] Cardiopulmonary symptoms are variable; she states that some days she basically remains sedentary while other days she can function @ her baseline with oxygen . There are no definite exacerbating factors for this pattern. Other or new symptoms denied. Pertinent physical findings include: Bradycardia is present; breath sounds are markedly distant and difficult to auscultate; she has subtle clubbing of the nailbeds; vitiliginous scarring present of the forearms. All physical findings NCS                                                                     Elsie JULIANNA Roses MD,SI

## 2024-07-04 NOTE — Research (Signed)
 Title: A phase III Open-Label extension study to evaluate the long-term safety of Astegolimab in patients with Chronic Obstructive Pulmonary disease.   Dose and Duration of Treatment: Astegolimab is presented as a sterile, slightly brown-yellow solution. Each single-use, 2.25 mL pre-filled syringe contains 1.7 mL deliverable volume. Astegolimab drug product is formulated at 140 mg/mL astegolimab with 114 mM succinic acid, 200 mM L-arginine, 10 mM L-methionine, 0.06% (w/v) polysorbate 20, pH 5.7. Placebo for astegolimab is supplied in an identical pre-filled syringe configuration.   Protocol # [id number] Sponsor: [employer] [address] [place]   Protocol Version: v3 dated 15Oct2024 IB: version 9.0 dated April 2024 ICF:  ICFs: Make sure that the subject is re-consented with the updated ICFs Main ICF: [facility] Approved Version 15 Jun 2023, Revised 28Nov2024 Mobile Nursing: [facility] [facility] Approved Version 26Oct2023, Revised 08Aug2024 Optional Home Study Treatment: IRB Approved Version 12 May 2022 Revised 23 Feb 2023 ADDENDUM 1: IRB Approved Version 24Jul2024 Revised 8Aug2024 Lab Manual: V2.1.0 dated 21Jun2024   Mechanism of action: Astegolimab (also known as [id number] or [id number]) is a fully human, IgG2 monoclonal antibody that binds with high affinity to the interleukin (IL)-33 (IL-33) receptor, ST2, thereby blocking the signaling of IL-33, an inflammatory cytokine of the IL-1 family and member of the alarmin class of molecules. Astegolimab binds with high affinity to the human and cynomolgus monkey receptor for IL-33, ST2, and blocks IL-33 binding, thus inhibiting association with the IL-1R accessory protein (AcP) co-receptor and formation of an activated receptor complex.   Key Inclusion Criteria: Able and willing to provide written informed consent and to comply with the study protocol. Ability to comply with the requirements of study protocol, according  to the investigators's best judgement. Completion of the 52-week treatment period in either [place] or Study HA55667.   Key Exclusion Criteria: Significant non- compliance in the parent study, specifically defined as missing scheduled visits, per investigators judgement. Any new clinically significant pulmonary disease other than COPD since enrolling in the study. Unstable cardiac disease, myocardial infarction, or [place]  Heart Association Class III or IV heart failure since enrolling in the parent study.   Integrated Pharmacokinetic/Pharmacodynamic Analysis:  In Studies HA60757, [id number], and [id number], exploratory biomarker analysis showed there were consistent decreases in blood eosinophil counts throughout the treatment period, potentially mediated by a direct effect of IL-33 on eosinophil progenitors. In [place], there was no significant difference in fractional exhaled nitric oxide (FeNO) levels (reflecting airway IL-4/IL-13 activity) between astegolimab-treated groups relative to placebo throughout the treatment period. These data suggest that astegolimab has only a limited effect on Type 2 inflammation in asthma. No data are applicable for Study [id number]. No data are available yet for Study HA56688.   Special Warnings/Considerations: Administration of astegolimab, a protein therapeutic, may lead to the development of anti-astegolimab antibodies which could lead to AEs and/or decreased exposure. In non-clinical studies (see Section 4.2), ADA incidence has generally been low and there was no apparent ADA impact on PK and safety in these studies. To date, the immunogenicity rates observed with astegolimab in clinical studies have been relatively low as well (see Immunogenicity Section 5.6). Several clinical studies have been conducted in patients with asthma, atopic dermatitis, COPD (IIS Study HA59431) and COVID-19 severe pneumonia, which generally showed low incidence of ADAs (Table 31).  There has been no correlation between ADA status and clinical findings or increased incidence of AEs. Astegolimab is now being considered in a larger study for the treatment of  COPD. This patient population is typically considered to have a hyper-responsive immune system. Route of administration for this molecule will be [place], either Q2W or Q4W. These factors increase the risk of development of an immune response to astegolimab, specifically with repeat dosing. From the previous IIS Study HA59431, incidence of ADAs was low in COPD patients; this remains to be confirmed in a larger study. To monitor ADA development in ongoing studies, serum samples will be collected from patients at protocol-defined intervals. Patients who test positive for antibodies and have clinical sequelae that are considered potentially related to an ADA response may also be asked to return for additional follow-up testing.   Drug Interaction Studies: No PK drug interaction studies have been conducted to date.   Serious Adverse Reactions Observed in Asthma Study: During the treatment period, a similar proportion of patients across all cohorts experienced at least one AE, regardless of causality (Table 22). In total, 77.2%, 70.9%, 72.2%, and 72.1% of patients reported at least 1 AE in the placebo, 70 mg, 210 mg, and 490 mg groups, respectively. The most common AE's (>5% in any treatment group) were asthma, nasopharyngitis, upper respiratory tract infection, headache, and injection site reaction (ISR). The most common drug-related AE was ISR, which was reported more frequently in the astegolimab treatment groups than in the placebo group (1 [0.8%] patient in the placebo group, 10 [7.9%] patients with 70 mg, 8 [6.3%] patients with 210 mg, 6 [4.9%] patients with 490 mg). All ISRs were non-serious and mild or moderate in severity. During the treatment period, 50 SAEs were reported in 37 (7.4%) patients. The number of patients reporting SAEs was  comparable across all cohorts (11 SAEs in 8 patients on placebo, 21 SAEs in 14 patients on 70 mg, 11 SAEs in 9 patients on 210 mg, and 7 SAEs in 6 patients on 490 mg). The most common SAE was asthma. One SAE of moderate livedo reticularis (70 mg) was considered a suspected unexpected serious adverse drug reaction (SUSAR) related to astegolimab and was reported two days after the second dose, leading to discontinuation of astegolimab. Two (0.4%) patients reported anaphylaxis and hypersensitivity reactions: 1 severe SAE of anaphylactic reaction (placebo), and 1 moderate hypersensitivity (490 mg) considered related to astegolimab. Three (0.6%) patients experienced a potential Major Adverse Cardiac Event (MACE) (1 patient each in the placebo, 70 mg, and 210 mg groups). None of the potential MACE were considered related to astegolimab. Overall, 233 (46.4%) patients reported events of infection. A comparable number of patients reported infection across all treatment groups (65 [51.2%] patients on placebo, 55 [43.3%] patients on 70 mg, 58 [46.0%] patients on 210 mg, and 55 [45.1%] patients on 490 mg). The most frequently reported infection (>=10% incidence) was nasopharyngitis (12.7%). One patient on astegolimab 210 mg and the partner of one patient on placebo became pregnant during the study. Both delivered normal/healthy babies. Two deaths, unrelated to study drug, were reported: one patient on 210 mg astegolimab died following an SAE of asthma; the other patient on 490 mg astegolimab had an unexplained death. There were no clinically meaningful changes in laboratory parameters, vital signs, or ECG results, other than the 10% decrease in mean blood eosinophil counts in the astegolimab-treated groups, with no safety concerns. Treatment-induced ADAs were comparable between the astegolimab groups and had no impact on safety. Overall, astegolimab was well tolerated at all doses used and had a safety profile consistent with  that observed in the previous astegolimab Phase I studies.   Serious  Adverse Reactions Observed in Previous COPD Study: In the completed IIS Study HA59431, a total of 81 patients received at least one dose of astegolimab or placebo. The safety profile of astegolimab was similar to that of placebo. There were a total of 222 AEs reported in 62 patients. A total of 28 (72%) patients in the placebo arm and 34 (81%) patients in the astegolimab arm reported at least one AE. The most commonly reported AEs were headache followed by urinary tract infection and viral upper respiratory tract infection. A total of 39 SAEs were reported in 28 (35%) patients; 16 (41%) patients in the placebo group and 12 (29%) in the astegolimab group. The most commonly reported SAE was hospital admission for community acquired pneumonia. Four SAEs resulted in patient discontinuation from treatment (1 patient in the placebo group and 3 patients in the astegolimab group). One patient in the placebo group experienced an AESI of potential MACE (heart failure, unrelated to the study treatment). No anaphylaxis or pregnancies were reported. Two deaths, unrelated to study treatment, were reported: 1 patient in the placebo group died after hospital acquired pneumonia, and another patient in the placebo group died after pneumonia and type 2 respiratory failure.   Safety Data: Astegolimab has been generally well tolerated. There have been 49 patient deaths across all astegolimab studies (Section 5.5.2), none of which were considered related to astegolimab. A total of 144 subjects experienced a total of 224 SAEs across all astegolimab studies. Of these, an SAE livedo reticularis observed in 1 patient was considered related to astegolimab by the investigator (Section 5.5.3). AEs leading to withdrawal (Section 5.5.4) have generally occurred at a rate similar to what would be expected for clinical trials in the studies' respective indications.   [facility]  @ [place] Clinical Research Coordinator note:    This visit for Subject: [id number] DOB: 1956-02-07 on 17Dec2025 for the above protocol is Visit/Encounter # End of Study before week 52.   Subject expressed continued interest and consent in continuing as a study subject. Subject confirmed that there was no change in contact information (e.g. address, telephone, email). Subject thanked for participation in research and contribution to science. The Subject was informed that the PI Dr. Geronimo continues to have oversight of the subject's visits and course through relevant discussions, reviews and specifically of this visit by routing of this note to the PI.  All assessments and procedures were performed per above stated protocol. Please refer to subject's paper source binder for further details of this visit.   Signed by Octaviano Paras, CCRP, COT Clinical Research Coordinator  [facility]  [place], [place]

## 2024-07-08 NOTE — Telephone Encounter (Signed)
 Called [facility] for update. They received our fax with the PA approval on 12/15. They confirm they have also documented that patient expressed financial hardship.   The representative I spoke with said she would message the case worker with a reminder to review the PA that was sent so that they can move the application forward.   They report this process may take 3-5 business days.  Will await further communication.

## 2024-07-09 ENCOUNTER — Ambulatory Visit: Admitting: Urology

## 2024-07-09 ENCOUNTER — Encounter: Payer: Self-pay | Admitting: Urology

## 2024-07-09 VITALS — BP 142/83 | HR 82 | Ht 65.5 in | Wt 173.0 lb

## 2024-07-09 DIAGNOSIS — N3946 Mixed incontinence: Secondary | ICD-10-CM | POA: Diagnosis not present

## 2024-07-09 NOTE — Progress Notes (Signed)
 PTNS  Session # 24  Health & Social Factors: no change Caffeine: 2 Alcohol: 0 Daytime voids #per day: 2 Night-time voids #per night: 1 Urgency: mild Incontinence Episodes #per day: 5-6 Ankle used: right Treatment Setting: 19 Feeling/ Response: sensory in heel  Comments: No comments  Performed By: Myrtha dante LATHER and Florrie Gobble, NT  Follow Up: Follow up in one month for monthly maintance visit

## 2024-07-09 NOTE — Patient Instructions (Signed)

## 2024-07-10 NOTE — Telephone Encounter (Signed)
 Maria [name]  She had end of Treatment visit. She still has to to safety visit but at this point she is off study drug. She can take Dupoixent. Please find out first date of dupixent  Thanks  MR

## 2024-07-15 ENCOUNTER — Other Ambulatory Visit: Payer: Self-pay | Admitting: Internal Medicine

## 2024-07-15 MED ORDER — BREZTRI AEROSPHERE 160-9-4.8 MCG/ACT IN AERO: 2.0000 | INHALATION_SPRAY | Freq: Two times a day (BID) | RESPIRATORY_TRACT | 3 refills | Status: AC

## 2024-07-22 ENCOUNTER — Other Ambulatory Visit: Payer: Self-pay | Admitting: Internal Medicine

## 2024-07-22 DIAGNOSIS — Z006 Encounter for examination for normal comparison and control in clinical research program: Secondary | ICD-10-CM

## 2024-07-22 NOTE — Telephone Encounter (Signed)
 No updates received. Emailed FRM, [place], to ask if there are any updates. Scheduled patient for new start Dupixent injection in clinic on 07/24/24 to avoid further delays in care.

## 2024-07-24 ENCOUNTER — Other Ambulatory Visit: Payer: Self-pay | Admitting: Internal Medicine

## 2024-07-24 ENCOUNTER — Other Ambulatory Visit (HOSPITAL_COMMUNITY): Payer: Self-pay

## 2024-07-24 ENCOUNTER — Ambulatory Visit

## 2024-07-24 ENCOUNTER — Other Ambulatory Visit: Payer: Self-pay

## 2024-07-24 DIAGNOSIS — Z7189 Other specified counseling: Secondary | ICD-10-CM

## 2024-07-24 DIAGNOSIS — J449 Chronic obstructive pulmonary disease, unspecified: Secondary | ICD-10-CM

## 2024-07-24 MED FILL — Benzonatate Cap 200 MG: ORAL | 30 days supply | Qty: 90 | Fill #0 | Status: AC

## 2024-07-24 NOTE — Progress Notes (Signed)
 "  HPI Patient presents today to [facility] to see pharmacy team for Dupixent new start. She is referred by Dr. Geronimo. Past medical history includes stage 4 very severe COPD. Last visit with Dr. Geronimo was on 06/24/24 when she had acute exacerbation. Of note, she was a publishing rights manager. Per 06/24/24 telephone thread, she had end of treatment visit and is off study drug, can start Dupixent.  Respiratory Medications Current regimen:  - Breztri  160-9-4.25mcg/act (Inhale 2 puffs into the lungs in the morning and at bedtime) - albuterol  108 mcg/act (Inhale 2 puffs into the lungs every 6 (six) hours as needed for wheezing or shortness of breath) - Hycodan 5-1.44mcg/5mL syrup (Take 5 mLs by mouth 2 (two) times daily as needed for cough) - for palliative relief of cough  Patient reports no known adherence challenges  OBJECTIVE Allergies[1]  Outpatient Encounter Medications as of 07/24/2024  Medication Sig   albuterol  (VENTOLIN  HFA) 108 (90 Base) MCG/ACT inhaler Inhale 2 puffs into the lungs every 6 (six) hours as needed for wheezing or shortness of breath.   ASPIRIN  LOW DOSE 81 MG EC tablet TAKE 1 TABLET BY MOUTH EVERY DAY   atorvastatin  (LIPITOR) 20 MG tablet Take 1 tablet (20 mg total) by mouth daily.   Azelastine  HCl 137 MCG/SPRAY SOLN USE 1 SPRAY INTO EACH NOSTRIL TWICE A DAY   benzonatate  (TESSALON ) 200 MG capsule Take 1 capsule (200 mg total) by mouth 3 (three) times daily as needed for cough.   budesonide -glycopyrrolate -formoterol  (BREZTRI  AEROSPHERE) 160-9-4.8 MCG/ACT AERO inhaler Inhale 2 puffs into the lungs in the morning and at bedtime.   clopidogrel  (PLAVIX ) 75 MG tablet Take 1 tablet (75 mg total) by mouth daily.   escitalopram  (LEXAPRO ) 20 MG tablet Take 1 tablet (20 mg total) by mouth daily.   gabapentin  (NEURONTIN ) 600 MG tablet Take 1 tablet (600 mg total) by mouth every 8 (eight) hours.   HYDROcodone  bit-homatropine (HYCODAN) 5-1.5 MG/5ML syrup Take 5 mLs by mouth 2  (two) times daily as needed for cough.   hydrOXYzine  (VISTARIL ) 25 MG capsule TAKE 1 CAPSULE BY MOUTH AT BEDTIME AS NEEDED   ipratropium-albuterol  (DUONEB) 0.5-2.5 (3) MG/3ML SOLN Take 1 vial (3 mLs) by nebulization 4 (four) times daily as needed.   isosorbide  mononitrate (IMDUR ) 30 MG 24 hr tablet Take 1 tablet (30 mg total) by mouth every morning.   Multiple Vitamins-Minerals (MULTIVITAMIN WITH MINERALS) tablet Take 1 tablet by mouth daily. Alive Woman 50+   nitroGLYCERIN  (NITROSTAT ) 0.4 MG SL tablet Place 1 tablet (0.4 mg total) under the tongue every 5 (five) minutes as needed.   OVER THE COUNTER MEDICATION Place 1 drop into both eyes daily as needed (Dry and itching eyes). Thera Tears   oxybutynin  (DITROPAN -XL) 10 MG 24 hr tablet Take 1 tablet (10 mg total) by mouth daily.   OXYGEN  Inhale 3-5 L into the lungs continuous. 3-4 liters at rest and 5 liters with any activity.-   Respiratory Therapy Supplies (FLUTTER) DEVI Use as directed   roflumilast  (DALIRESP ) 500 MCG TABS tablet Take 1 tablet by mouth once daily   sertraline  (ZOLOFT ) 50 MG tablet Take 1 tablet by mouth once daily   sodium chloride  HYPERTONIC 3 % nebulizer solution Take by nebulization 2 (two) times daily.   Facility-Administered Encounter Medications as of 07/24/2024  Medication   methylPREDNISolone  acetate (DEPO-MEDROL ) injection 80 mg   Study - ARNASA HA56625 (open-label) - astegolimab 238 mg/1.7 mL SQ injection (PI-Ramaswamy)   Study - ARNASA HA56625 (open-label) -  astegolimab 238 mg/1.7 mL SQ injection (PI-Ramaswamy)   Study - ARNASA HA56625 (open-label) - astegolimab 238 mg/1.7 mL SQ injection (PI-Ramaswamy)   Study - ARNASA HA56625 (open-label) - astegolimab 238 mg/1.7 mL SQ injection (PI-Ramaswamy)   Study - ARNASA HA56625 (open-label) - astegolimab 238 mg/1.7 mL SQ injection (PI-Ramaswamy)   Study - ARNASA HA56625 (open-label) - astegolimab 238 mg/1.7 mL SQ injection (PI-Ramaswamy)   Study - ARNASA HA56625  (open-label) - astegolimab 238 mg/1.7 mL SQ injection (PI-Ramaswamy)   Study - ARNASA HA56625 (open-label) - astegolimab 238 mg/1.7 mL SQ injection (PI-Ramaswamy)   Study - ARNASA HA56625 (open-label) - astegolimab 238 mg/1.7 mL SQ injection (PI-Ramaswamy)   Study - ARNASA HA56625 (open-label) - astegolimab 238 mg/1.7 mL SQ injection (PI-Ramaswamy)   Study - ARNASA HA56625 (open-label) - astegolimab 238 mg/1.7 mL SQ injection (PI-Ramaswamy)   Study - ARNASA HA55667 - astegolimab 238 mg/1.7 mL or placebo SQ injection (PI-Ramaswamy)   Study - ARNASA HA55667 - astegolimab 238 mg/1.7 mL or placebo SQ injection (PI-Ramaswamy)     Immunization History  Administered Date(s) Administered   Fluad Quad(high Dose 65+) 03/30/2022   INFLUENZA, HIGH DOSE SEASONAL PF 03/30/2022, 03/27/2023, 03/29/2024   Influenza, Seasonal, Injecte, Preservative Fre 04/28/2008, 04/09/2010   Influenza,inj,Quad PF,6+ Mos 04/22/2019, 04/25/2020, 03/15/2021   Influenza-Unspecified 06/01/2008, 05/10/2016, 03/18/2017   Moderna Sars-Covid-2 Vaccination 07/30/2019, 08/28/2019, 06/27/2020   PNEUMOCOCCAL CONJUGATE-20 07/06/2022   Pfizer Covid-19 Vaccine Bivalent Booster 10yrs & up 07/28/2021   Pneumococcal Conjugate-13 06/03/2024   Pneumococcal Polysaccharide-23 04/15/2015, 04/22/2020   RSV,unspecified 07/06/2022   Respiratory Syncytial Virus Vaccine,Recomb Aduvanted(Arexvy) 05/30/2022   Td 10/01/2020   Tdap 11/17/2009, 03/27/2023   Zoster Recombinant(Shingrix) 04/16/2020, 06/22/2020     PFTs    Latest Ref Rng & Units 08/29/2022    8:29 AM 11/12/2020   10:43 AM 01/16/2020   10:57 AM 06/24/2019    8:45 AM  PFT Results  FVC-Pre L 2.79  2.94  3.17  2.93   FVC-Predicted Pre % 83  84  90  83   FVC-Post L  3.18  3.25  3.39   FVC-Predicted Post %  91  92  96   Pre FEV1/FVC % % 57  54  55  56   Post FEV1/FCV % %  54  56  59   FEV1-Pre L 1.60  1.60  1.75  1.64   FEV1-Predicted Pre % 62  59  65  60   FEV1-Post L  1.71  1.83   1.99   DLCO uncorrected ml/min/mmHg 9.85  8.06  10.81  12.24   DLCO UNC% % 47  37  50  57   DLCO corrected ml/min/mmHg 9.85  8.06  11.29    DLCO COR %Predicted % 47  37  52    DLVA Predicted % 50  57  61  60   TLC L  4.89  5.25  5.81   TLC % Predicted %  91  98  108   RV % Predicted %  88  86  110      Eosinophils Most recent blood eosinophil count was 100 cells/microL taken on 06/24/24.   IgE: 262 on 05/07/24   Assessment   Biologics training for dupilumab (Dupixent)  Goals of therapy: Mechanism: human monoclonal IgG4 antibody that inhibits interleukin-4 and interleukin-13 cytokine-induced responses, including release of proinflammatory cytokines, chemokines, and IgE Reviewed that Dupixent is add-on medication and patient must continue maintenance inhaler regimen. Response to therapy: may take 4 months to determine efficacy.  Discussed that patients generally feel improvement sooner than 4 months.  Side effects: injection site reaction (6-18%), antibody development (5-16%), ophthalmic conjunctivitis (2-16%), transient blood eosinophilia (1-2%)  Dose: 300mg  every 14 days (for COPD)  Administration/Storage:  Reviewed administration sites of thigh or abdomen (at least 2-3 inches away from abdomen). Reviewed the upper arm is only appropriate if caregiver is administering injection  Do not shake pen/syringe as this could lead to product foaming or precipitation. Do not use if solution is discolored or contains particulate matter or if window on prefilled pen is yellow (indicates pen has been used).  Reviewed storage of medication in refrigerator. Reviewed that Dupixent can be stored at room temperature in unopened carton for up to 14 days.  Access: Approval of Dupixent through: patient assistance program  Patient self-administered Dupixent 300mg /52ml x 1 (total dose 300mg ) in right lower abdomen   Dupixent 300mg /71mL autoinjector pen NDC: 0024-5915-02  Sent remaining Dupixent  300mg /36mL autoinjector pen home with patient for next dose due on 08/07/24. We extensively reviewed appropriate storage of medication.  Patient monitored for 30 minutes for adverse reaction.  Patient tolerated well. Patient denies itchiness and irritation at injection.  PLAN Continue Dupixent 300mg  every 14 days.  Next dose is due 08/07/24 and every 14 days thereafter. Rx will be sent from [facility], a pharmacy contracted through [facility] Patient assistance program Continue maintenance regimen without changes - emphasized that Dupixent does NOT replace other medications.  All questions encouraged and answered.  Instructed patient to reach out with any further questions or concerns.  Thank you for allowing pharmacy to participate in this patient's care.  This appointment required 45 minutes of patient care (this includes precharting, chart review, review of results, face-to-face care, etc.).  Aleck Puls, PharmD, BCPS, CPP Clinical Pharmacist  [facility]  [facility]     [1]  Allergies Allergen Reactions   Montelukast      Bad dreams   "

## 2024-07-24 NOTE — Patient Instructions (Addendum)
 Your next Dupixent dose is due on 08/07/24, 08/21/24, and every 14 days thereafter  This medication is stored in the refrigerator. Please note, you should remove the drug from the refrigerator AT LEAST 45 minutes prior to injection to ensure the injection comes down to room temperature before injecting the medication.   If needed, this medication can be stored at room temperature for no longer than 14 days.   If you miss a dose, you should take it as soon as you remember it within 7 days of the missed dose, then continue your usual dosing schedule.  As a reminder, some side effects include injection site reaction, headache, and back pain or joint pain. If you are struggling with side effects, please call our office.   It can take several weeks to see the potential benefit of the medication. Please continue your other medications.   CONTINUE all other medications as prescribed. Dupixent does NOT replace other medications.  Your prescription will be shipped from [facility] through the [facility] Program.   You will need to be seen by your provider in 3 to 4 months to assess how Dupixent is working for you. Your next follow-up appointment is scheduled on 08/07/24 with Maria Lites, NP.   Stay up to date on all routine vaccines: influenza, pneumonia, COVID19, Shingles  How to manage an injection site reaction: Remember the 5 C's: COUNTER - leave on the counter at least 30 minutes but up to overnight to bring medication to room temperature. This may help prevent stinging COLD - place something cold (like an ice gel pack or cold water bottle) on the injection site just before cleansing with alcohol. This may help reduce pain CLARITIN - use Claritin (generic name is loratadine) for the first two weeks of treatment or the day of, the day before, and the day after injecting. This will help to minimize injection site reactions CORTISONE CREAM - apply if injection site is irritated and  itching CALL ME - if injection site reaction is bigger than the size of your fist, looks infected, blisters, or if you develop hives

## 2024-07-24 NOTE — Telephone Encounter (Signed)
 Patient scheduled for first dose in clinic today. Received first dose of Dupixent, tolerated well. She reports she was told by [facility] representative that she will hear from [facility] pharmacy for shipment of medication in the next couple days.

## 2024-07-24 NOTE — Telephone Encounter (Signed)
 First Dupixent injection administered in office 07/24/24.

## 2024-07-24 NOTE — Telephone Encounter (Signed)
 Received email from [place], [place] that they have received the PA but based on his information, they did not have documented that the patient had expressed financial hardship. I replied letting him know that I was advised on 07/08/24 that there was documentation that the patient had called to express financial hardship. Will await update.

## 2024-07-24 NOTE — Telephone Encounter (Signed)
 Received Dupixent PAP approval letter. Pt is approved from 07/24/24 up to a period of 12 months. Letter scanned to media tab.

## 2024-07-26 ENCOUNTER — Ambulatory Visit (HOSPITAL_BASED_OUTPATIENT_CLINIC_OR_DEPARTMENT_OTHER)
Admission: RE | Admit: 2024-07-26 | Discharge: 2024-07-26 | Disposition: A | Source: Ambulatory Visit | Attending: Internal Medicine | Admitting: Internal Medicine

## 2024-07-26 ENCOUNTER — Other Ambulatory Visit (HOSPITAL_COMMUNITY): Payer: Self-pay

## 2024-07-26 DIAGNOSIS — M87051 Idiopathic aseptic necrosis of right femur: Secondary | ICD-10-CM | POA: Insufficient documentation

## 2024-07-26 DIAGNOSIS — J449 Chronic obstructive pulmonary disease, unspecified: Secondary | ICD-10-CM | POA: Diagnosis present

## 2024-07-26 DIAGNOSIS — J9611 Chronic respiratory failure with hypoxia: Secondary | ICD-10-CM | POA: Diagnosis present

## 2024-07-26 DIAGNOSIS — R911 Solitary pulmonary nodule: Secondary | ICD-10-CM | POA: Insufficient documentation

## 2024-07-27 ENCOUNTER — Other Ambulatory Visit (INDEPENDENT_AMBULATORY_CARE_PROVIDER_SITE_OTHER): Payer: Self-pay | Admitting: Vascular Surgery

## 2024-07-27 DIAGNOSIS — I6522 Occlusion and stenosis of left carotid artery: Secondary | ICD-10-CM

## 2024-07-29 ENCOUNTER — Other Ambulatory Visit (HOSPITAL_COMMUNITY): Payer: Self-pay

## 2024-07-30 ENCOUNTER — Other Ambulatory Visit (HOSPITAL_COMMUNITY): Payer: Self-pay

## 2024-08-02 ENCOUNTER — Encounter: Payer: Self-pay | Admitting: Nurse Practitioner

## 2024-08-02 DIAGNOSIS — I6522 Occlusion and stenosis of left carotid artery: Secondary | ICD-10-CM

## 2024-08-02 MED ORDER — CLOPIDOGREL BISULFATE 75 MG PO TABS: 75.0000 mg | ORAL_TABLET | Freq: Every day | ORAL | 3 refills | Status: AC

## 2024-08-02 NOTE — Telephone Encounter (Signed)
 She should follow with [place] because they did last intervention

## 2024-08-07 ENCOUNTER — Ambulatory Visit: Admitting: Acute Care

## 2024-08-07 ENCOUNTER — Encounter: Payer: Self-pay | Admitting: Acute Care

## 2024-08-07 ENCOUNTER — Other Ambulatory Visit: Payer: Self-pay | Admitting: Acute Care

## 2024-08-07 ENCOUNTER — Other Ambulatory Visit (HOSPITAL_COMMUNITY): Payer: Self-pay

## 2024-08-07 ENCOUNTER — Ambulatory Visit

## 2024-08-07 VITALS — BP 142/53 | HR 74 | Temp 98.3°F | Ht 65.5 in | Wt 179.8 lb

## 2024-08-07 DIAGNOSIS — J439 Emphysema, unspecified: Secondary | ICD-10-CM | POA: Diagnosis not present

## 2024-08-07 DIAGNOSIS — Z87891 Personal history of nicotine dependence: Secondary | ICD-10-CM | POA: Diagnosis not present

## 2024-08-07 DIAGNOSIS — I700 Atherosclerosis of aorta: Secondary | ICD-10-CM

## 2024-08-07 DIAGNOSIS — J449 Chronic obstructive pulmonary disease, unspecified: Secondary | ICD-10-CM

## 2024-08-07 DIAGNOSIS — R911 Solitary pulmonary nodule: Secondary | ICD-10-CM

## 2024-08-07 DIAGNOSIS — R058 Other specified cough: Secondary | ICD-10-CM | POA: Diagnosis not present

## 2024-08-07 DIAGNOSIS — J9611 Chronic respiratory failure with hypoxia: Secondary | ICD-10-CM | POA: Diagnosis not present

## 2024-08-07 DIAGNOSIS — R918 Other nonspecific abnormal finding of lung field: Secondary | ICD-10-CM | POA: Diagnosis not present

## 2024-08-07 DIAGNOSIS — R053 Chronic cough: Secondary | ICD-10-CM

## 2024-08-07 DIAGNOSIS — R9389 Abnormal findings on diagnostic imaging of other specified body structures: Secondary | ICD-10-CM

## 2024-08-07 DIAGNOSIS — M87051 Idiopathic aseptic necrosis of right femur: Secondary | ICD-10-CM

## 2024-08-07 LAB — PULMONARY FUNCTION TEST
FEF 25-75 Pre: 0.42 L/s
FEF2575-%Pred-Pre: 19 %
FEV1-%Pred-Pre: 43 %
FEV1-Pre: 1.1 L
FEV1FVC-%Pred-Pre: 61 %
FEV6-%Pred-Pre: 71 %
FEV6-Pre: 2.24 L
FEV6FVC-%Pred-Pre: 100 %
FVC-%Pred-Pre: 71 %
FVC-Pre: 2.33 L
Pre FEV1/FVC ratio: 47 %
Pre FEV6/FVC Ratio: 96 %

## 2024-08-07 MED ORDER — HYDROCODONE BIT-HOMATROP MBR 5-1.5 MG/5ML PO SOLN: 5.0000 mL | Freq: Two times a day (BID) | ORAL | 0 refills | Status: AC | PRN

## 2024-08-07 NOTE — Progress Notes (Signed)
 Spiro only performed today

## 2024-08-07 NOTE — Progress Notes (Signed)
 "  History of Present Illness Maria [name] is a 69 y.o. female female former smoker followed through the lung cancer  screening program, and by Dr. Geronimo. Medical history significant for COPD, chronic respiratory failure, bronchiectasis, positive ANA, emphysema, pulmonary hypertension, aortic arthrosclerosis, cervical fusion, lipidemia, history of DVT. She was  recently  participating in a research protocol  evaluating  the efficacy and safety of astegolimab in patients with chronic obstructive pulmonary disease .She has had to drop out of the protocol due to need to start biologic treatment to see if it will improve her frequency of COPD flares.     08/07/2024 Discussed the use of AI scribe software for clinical note transcription with the patient, who gave verbal consent to proceed.  History of Present Illness Pt. Presents for follow up to review surveillance CT Chest as follow up of a LR 4 B scan noted through the lung cancer screening program 04/2023. PET scan at that time showed the nodule was decreasing in size , and was most likley infectious or inflammatory in nature.  She was last seen by Dr. Geronimo 06/24/2024. She started her Dupixent 07/24/2024. She is schedule for her second injection today.    Maria [name] is a 69 year old female with COPD who presents for a three-month follow-up to review surveillance CT Chest for stability after an abnormal low dose Ct Chest.  She has experienced progression of her COPD symptoms. She quit smoking in 2018. Her current medications include Breztri , two puffs twice a day, which has become less effective over time. She started Dupixent on January 7 and uses DuoNebs up to four times a day, typically twice daily. Although she finds the nebulizer treatment effective, it is limiting. She also takes Tessalon  pearls for her cough, which have been helpful, and was recently prescribed Roflumilast . She uses a flutter valve and performs isotonic saline  treatments once daily.  A recent CT scan on January 9 showed resolution of a previously noted left lower lobe nodule, and another nodule remains unchanged.This is reassuring.   She experiences frequent coughing, often producing clear sputum, though it sometimes turns yellow with a bitter taste. She describes episodes of intense coughing fits, particularly at night, which can be distressing. She was last on antibiotics in November or December, along with prednisone , which helps when her symptoms become severe. She is hesitant to take antibiotics unless necessary, noting a temporary improvement followed by a return of symptoms.She does not feel she is flaring at present. She does not want antibiotic or prednisone .  She is currently on oxygen  therapy, using a machine set to four liters at home, and increases to five liters when active. She mentions the financial burden of her medications and equipment, including the cost of batteries for her portable oxygen  machine, which cost $900.00 each.  Plan is to return the patient to the annual lung cancer screening program. Scan will be due 07/2025.     Test Results: CT chest without contrast 07/26/2024 he previously noted left lower lobe solid pulmonary nodule is no longer identified, consistent with infectious or inflammatory nodule. Recommend resume annual low-dose lung cancer screening chest CT 2. Advanced emphysema. 3. Aortic atherosclerosis.   PET scan 05/03/2024 The left lower lobe pulmonary nodule of interest is less apparent today, likely due to nondedicated technique and volume loss at the lung bases. No correlate hypermetabolism. Given that the nodule is at the low end of PET resolution, this remains indeterminate and lung cancer  screening CT follow-up in 3 months is recommended.   LDCT 04/22/2024 New 11.6 mm posterior left lower lobe nodule. Lung-RADS 4B, suspicious. Additional imaging evaluation or consultation with Pulmonology or  Thoracic Surgery recommended. These results will be called to the ordering clinician or representative by the Radiologist Assistant, and communication documented in the PACS or [employer]. 2. Aortic atherosclerosis (ICD10-I70.0). Coronary artery calcification. 3.  Emphysema (ICD10-J43.9).   PET Scan 06/26/2023 The previously demonstrated irregular right lower lobe pulmonary nodule has decreased in size and density since the prior CT of 6 weeks ago and demonstrates no hypermetabolic activity, most consistent with resolving focal inflammation/infection. Continue annual screening with low-dose chest CT without contrast in 12 months. 2. No suspicious findings. 3. Aortic Atherosclerosis (ICD10-I70.0) and Emphysema (ICD10-J43.9).   04/21/2023 Lung-RADS 4B, suspicious. 20.2 mm irregular nodular opacity in the posterior right base associated with other peripheral irregular nodularity and posterior right lower lobe airway impaction. Features may well be infectious/inflammatory but warrants close follow-up. Additional imaging evaluation or consultation with Pulmonology or Thoracic Surgery recommended. 2.  Emphysema (ICD10-J43.9) and Aortic Atherosclerosis (ICD10-170.0)      Latest Ref Rng & Units 06/24/2024    2:26 PM 05/07/2024    9:28 AM 03/29/2024   12:00 PM  CBC  WBC 4.0 - 10.5 K/uL 10.2  8.2  8.4   Hemoglobin 12.0 - 15.0 g/dL 87.1  88.8  88.7   Hematocrit 36.0 - 46.0 % 38.4  33.7  33.6   Platelets 150.0 - 400.0 K/uL 342.0  300.0  327.0        Latest Ref Rng & Units 03/29/2024   12:00 PM 01/13/2024    5:21 AM 01/11/2024   10:14 AM  BMP  Glucose 70 - 99 mg/dL 87  885  88   BUN 6 - 23 mg/dL 14  9  13    Creatinine 0.40 - 1.20 mg/dL 9.07  9.39  9.24   Sodium 135 - 145 mEq/L 144  140  135   Potassium 3.5 - 5.1 mEq/L 4.0  3.8  4.4   Chloride 96 - 112 mEq/L 106  107  105   CO2 19 - 32 mEq/L 32  25  23   Calcium  8.4 - 10.5 mg/dL 9.5  8.5  9.4     BNP    Component Value  Date/Time   BNP 173.0 (H) 05/12/2023 0627    ProBNP    Component Value Date/Time   PROBNP 36.0 03/31/2022 1019    PFT    Component Value Date/Time   FEV1PRE 1.10 08/07/2024 0853   FEV1POST 1.71 11/12/2020 1043   FVCPRE 2.33 08/07/2024 0853   FVCPOST 3.18 11/12/2020 1043   TLC 4.89 11/12/2020 1043   DLCOUNC 9.85 08/29/2022 0829   PREFEV1FVCRT 47 08/07/2024 0853   PSTFEV1FVCRT 54 11/12/2020 1043    CT Chest Wo Contrast Result Date: 08/01/2024 CLINICAL DATA:  Lung nodule EXAM: CT CHEST WITHOUT CONTRAST TECHNIQUE: Multidetector CT imaging of the chest was performed following the standard protocol without IV contrast. RADIATION DOSE REDUCTION: This exam was performed according to the departmental dose-optimization program which includes automated exposure control, adjustment of the mA and/or kV according to patient size and/or use of iterative reconstruction technique. COMPARISON:  PET CT 05/03/2024, chest CT 04/22/2024, 05/11/2023 FINDINGS: Cardiovascular: Limited without intravenous contrast. Advanced aortic atherosclerosis. No aneurysm. Multi vessel coronary vascular calcification. Normal cardiac size. Trace pericardial effusion Mediastinum/Nodes: Patent trachea. No thyroid  mass. No suspicious lymph nodes. Esophagus within normal limits Lungs/Pleura: Advanced  emphysema. No acute airspace disease, pleural effusion or pneumothorax. The previously noted left lower lobe solid pulmonary nodule is no longer identified, consistent with infectious or inflammatory nodule. A central left lung base 4 mm nodule on series 3, image 110 is unchanged consistent with benign finding. No new lung nodule. Upper Abdomen: Nonacute Musculoskeletal: No suspicious osseous lesion IMPRESSION: 1. The previously noted left lower lobe solid pulmonary nodule is no longer identified, consistent with infectious or inflammatory nodule. Recommend resume annual low-dose lung cancer screening chest CT 2. Advanced emphysema. 3.  Aortic atherosclerosis. Aortic Atherosclerosis (ICD10-I70.0) and Emphysema (ICD10-J43.9). Electronically Signed   By: Luke Bun M.D.   On: 08/01/2024 23:23     Past medical hx Past Medical History:  Diagnosis Date   Anginal pain    Anxiety    Atherosclerosis 1/0612014   aorta, iliacs and CFA bilaterally no greater than 0-49% - lower arterial doppler.   Blood transfusion without reported diagnosis 2005   Cancer [facility]) Ovarian 1978   Chest pain    COPD (chronic obstructive pulmonary disease) (HCC) 2019   Coronary artery disease    COVID-19 long hauler manifesting chronic dyspnea    Depression December   Dyspnea    with minimal exertion   Emphysema of lung (HCC) 2019   Oxygen  deficiency Sept 2020   Oxygen  dependent    3 liters up to 5   Peripheral vascular disease    Carotid Artery Stenosis   Pneumonia      Social History[1]  Ms.Walpole reports that she quit smoking about 8 years ago. Her smoking use included cigarettes. She started smoking about 53 years ago. She has a 45 pack-year smoking history. She has been exposed to tobacco smoke. She has never used smokeless tobacco. She reports that she does not drink alcohol and does not use drugs.  Tobacco Cessation: Counseling given: Not Answered Former smoker   Past surgical hx, Family hx, Social hx all reviewed.  Current Outpatient Medications on File Prior to Visit  Medication Sig   albuterol  (VENTOLIN  HFA) 108 (90 Base) MCG/ACT inhaler Inhale 2 puffs into the lungs every 6 (six) hours as needed for wheezing or shortness of breath.   ASPIRIN  LOW DOSE 81 MG EC tablet TAKE 1 TABLET BY MOUTH EVERY DAY   atorvastatin  (LIPITOR) 20 MG tablet Take 1 tablet (20 mg total) by mouth daily.   Azelastine  HCl 137 MCG/SPRAY SOLN USE 1 SPRAY INTO EACH NOSTRIL TWICE A DAY   benzonatate  (TESSALON ) 200 MG capsule Take 1 capsule (200 mg total) by mouth 3 (three) times daily as needed for cough.   budesonide -glycopyrrolate -formoterol  (BREZTRI   AEROSPHERE) 160-9-4.8 MCG/ACT AERO inhaler Inhale 2 puffs into the lungs in the morning and at bedtime.   clopidogrel  (PLAVIX ) 75 MG tablet Take 1 tablet (75 mg total) by mouth daily.   Dupilumab (DUPIXENT) 300 MG/2ML SOAJ Inject 300 mg into the skin every 14 (fourteen) days.   escitalopram  (LEXAPRO ) 20 MG tablet Take 1 tablet (20 mg total) by mouth daily.   gabapentin  (NEURONTIN ) 600 MG tablet Take 1 tablet (600 mg total) by mouth every 8 (eight) hours.   HYDROcodone  bit-homatropine (HYCODAN) 5-1.5 MG/5ML syrup Take 5 mLs by mouth 2 (two) times daily as needed for cough.   hydrOXYzine  (VISTARIL ) 25 MG capsule TAKE 1 CAPSULE BY MOUTH AT BEDTIME AS NEEDED   ipratropium-albuterol  (DUONEB) 0.5-2.5 (3) MG/3ML SOLN Take 1 vial (3 mLs) by nebulization 4 (four) times daily as needed.   isosorbide  mononitrate (IMDUR ) 30  MG 24 hr tablet Take 1 tablet (30 mg total) by mouth every morning.   Multiple Vitamins-Minerals (MULTIVITAMIN WITH MINERALS) tablet Take 1 tablet by mouth daily. Alive Woman 50+   nitroGLYCERIN  (NITROSTAT ) 0.4 MG SL tablet Place 1 tablet (0.4 mg total) under the tongue every 5 (five) minutes as needed.   OVER THE COUNTER MEDICATION Place 1 drop into both eyes daily as needed (Dry and itching eyes). Thera Tears   oxybutynin  (DITROPAN -XL) 10 MG 24 hr tablet Take 1 tablet (10 mg total) by mouth daily.   OXYGEN  Inhale 3-5 L into the lungs continuous. 3-4 liters at rest and 5 liters with any activity.-   Respiratory Therapy Supplies (FLUTTER) DEVI Use as directed   roflumilast  (DALIRESP ) 500 MCG TABS tablet Take 1 tablet by mouth once daily   sertraline  (ZOLOFT ) 50 MG tablet Take 1 tablet by mouth once daily   sodium chloride  HYPERTONIC 3 % nebulizer solution Take by nebulization 2 (two) times daily.   Current Facility-Administered Medications on File Prior to Visit  Medication   methylPREDNISolone  acetate (DEPO-MEDROL ) injection 80 mg   Study - ARNASA HA56625 (open-label) - astegolimab 238  mg/1.7 mL SQ injection (PI-Ramaswamy)   Study - ARNASA HA56625 (open-label) - astegolimab 238 mg/1.7 mL SQ injection (PI-Ramaswamy)   Study - ARNASA HA56625 (open-label) - astegolimab 238 mg/1.7 mL SQ injection (PI-Ramaswamy)   Study - ARNASA HA56625 (open-label) - astegolimab 238 mg/1.7 mL SQ injection (PI-Ramaswamy)   Study - ARNASA HA56625 (open-label) - astegolimab 238 mg/1.7 mL SQ injection (PI-Ramaswamy)   Study - ARNASA HA56625 (open-label) - astegolimab 238 mg/1.7 mL SQ injection (PI-Ramaswamy)   Study - ARNASA HA56625 (open-label) - astegolimab 238 mg/1.7 mL SQ injection (PI-Ramaswamy)   Study - ARNASA HA56625 (open-label) - astegolimab 238 mg/1.7 mL SQ injection (PI-Ramaswamy)   Study - ARNASA HA56625 (open-label) - astegolimab 238 mg/1.7 mL SQ injection (PI-Ramaswamy)   Study - ARNASA HA56625 (open-label) - astegolimab 238 mg/1.7 mL SQ injection (PI-Ramaswamy)   Study - ARNASA HA56625 (open-label) - astegolimab 238 mg/1.7 mL SQ injection (PI-Ramaswamy)   Study - ARNASA HA55667 - astegolimab 238 mg/1.7 mL or placebo SQ injection (PI-Ramaswamy)   Study - ARNASA HA55667 - astegolimab 238 mg/1.7 mL or placebo SQ injection (PI-Ramaswamy)     Allergies[2]  Review Of Systems:  Constitutional:   No  weight loss, night sweats,  Fevers, chills, +fatigue, or  lassitude.  HEENT:   No headaches,  Difficulty swallowing,  Tooth/dental problems, or  Sore throat,                No sneezing, itching, ear ache, nasal congestion, post nasal drip,   CV:  No chest pain,  Orthopnea, PND, swelling in lower extremities, anasarca, dizziness, palpitations, syncope.   GI  No heartburn, indigestion, abdominal pain, nausea, vomiting, diarrhea, change in bowel habits, loss of appetite, bloody stools.   Resp: + shortness of breath with exertion less at rest.  + baseline  excess mucus, + baseline  productive cough,  No non-productive cough,  No coughing up of blood.  No change in color of mucus.  + baseline   wheezing.  No chest wall deformity  Skin: no rash or lesions.  GU: no dysuria, change in color of urine, no urgency or frequency.  No flank pain, no hematuria   MS:  No joint pain or swelling.  No decreased range of motion.  No back pain.  Psych:  No change in mood or affect. No depression or anxiety.  No memory loss.   Vital Signs BP (!) 142/53   Pulse 74   Temp 98.3 F (36.8 C) (Oral)   Ht 5' 5.5 (1.664 m)   Wt 179 lb 12.8 oz (81.6 kg)   SpO2 96% Comment: on 5L POC  BMI 29.47 kg/m    Physical Exam: Physical Exam VITALS: SaO2- 96% GENERAL: No distress, alert and oriented times 3. EARS NOSE THROAT: No sinus tenderness, tympanic membranes clear, pale nasal mucosa, no oral exudate, no post nasal drip, no lymphadenopathy. CHEST: Lungs clear to auscultation with minimal wheezing, no rales, dullness, no accessory muscle use, no nasal flaring, no sternal retractions. CARDIAC: S1, S2, regular rate and rhythm, no murmur. ABDOMINAL: Soft, non tender. ND, BS present., Body mass index is 29.47 kg/m.  EXTREMITIES: No clubbing, cyanosis, edema. No obvious deformities. NEUROLOGICAL: Normal strength. Alert and oriented x 3, MAE x 4. SKIN: No rashes, warm and dry. No obvious skin lesions. PSYCHIATRIC: Normal mood and behavior.   Assessment/Plan  Assessment and Plan Assessment & Plan Chronic obstructive pulmonary disease Chronic hypoxic respiratory failure Progression of COPD symptoms and flares with advanced emphysema and aortic atherosclerosis.  Persistent cough and sputum production.  Dupixent started 07/24/2024 Dupixent expected to show improvement in 3-4 months. Plan - Continue Breztri , Dupixent, Duo nebs, Tessalon , Roflumilast , and isotonic saline. - Maintain oxygen  therapy at 3-5 L/min. - Saturation goal is > 88% at all times  - Monitor for need of antibiotics and contact if symptoms worsen. - Follow up with research visit on February 25th, 2026 as is scheduled . - Continue  annual low-dose lung cancer screening 07/2025. - Hycodan Cough syrup has been re-filled. - Follow up in 3 months with Dr. Geronimo or Lauraine NP.   CT scan shows resolution of left lower lobe nodule and a benign 4 mm nodule at the left base.  - Continue annual low-dose lung cancer screening. - Call for hemoptysis, unexplained weight loss so we can see you sooner.   I spent 25 minutes dedicated to the care of this patient on the date of this encounter to include pre-visit review of records, face-to-face time with the patient discussing conditions above, post visit ordering of testing, clinical documentation with the electronic health record, making appropriate referrals as documented, and communicating necessary information to the patient's healthcare team.    Lauraine JULIANNA Lites, NP 08/07/2024  9:49 AM             [1]  Social History Tobacco Use   Smoking status: Former    Current packs/day: 0.00    Average packs/day: 1 pack/day for 45.0 years (45.0 ttl pk-yrs)    Types: Cigarettes    Start date: 03/19/1971    Quit date: 03/18/2016    Years since quitting: 8.3    Passive exposure: Past   Smokeless tobacco: Never  Vaping Use   Vaping status: Never Used  Substance Use Topics   Alcohol use: No    Alcohol/week: 0.0 standard drinks of alcohol   Drug use: No  [2]  Allergies Allergen Reactions   Montelukast      Bad dreams   "

## 2024-08-07 NOTE — Patient Instructions (Signed)
 Spiro only performed today

## 2024-08-07 NOTE — Patient Instructions (Addendum)
 It is good to see you today. We have reviewed your CT Chest . The nodules are stable. This is good news. We will return you to the lung cancer screening program , next scan in 1 year, so 07/2025. For your COPD, and chronic hypoxemia - continue oxygen  3-5 L - Continue - Breztri  scheduled - Continue research injections for now - MD has asked pharmacist to investigate Nucala or Dupixent - continue Hycodan cough syrup 5 mL twice daily for palliative relief of cough, I have refilled this medication - Continue Robitussin as needed - Continue Tessalon  Perles as needed Follow up in 3 months with Dr. Geronimo or Lauraine NP Note your daily symptoms > remember red flags for COPD:  Increase in cough, increase in sputum production, increase in shortness of breath or activity intolerance. If you notice these symptoms, please call to be seen.    Please contact office for sooner follow up if symptoms do not improve or worsen or seek emergency care

## 2024-08-09 ENCOUNTER — Telehealth: Payer: Self-pay | Admitting: Acute Care

## 2024-08-09 NOTE — Telephone Encounter (Signed)
 Refill has been sent in to pharmacy on 08/07/2024.

## 2024-08-12 ENCOUNTER — Other Ambulatory Visit: Payer: Self-pay | Admitting: Nurse Practitioner

## 2024-08-12 ENCOUNTER — Ambulatory Visit: Admitting: Physician Assistant

## 2024-08-12 DIAGNOSIS — F419 Anxiety disorder, unspecified: Secondary | ICD-10-CM

## 2024-08-12 DIAGNOSIS — F320 Major depressive disorder, single episode, mild: Secondary | ICD-10-CM

## 2024-08-13 NOTE — Progress Notes (Signed)
 Nodule has resolve. Just emhhysema on scan

## 2024-08-14 NOTE — Telephone Encounter (Signed)
 Got a refill request for sertraline . It shows she has escitalopram  and the sertraline  on her med list. She only needs to be taking one. Can we verify which she is taking

## 2024-08-14 NOTE — Telephone Encounter (Signed)
 Copied from CRM #[id number]. Topic: General - Call Back - No Documentation >> Aug 14, 2024 10:25 AM Rea BROCKS wrote: Reason for CRM: Pt confirmed she is only taking the sertraline .    [phone] (M)

## 2024-08-14 NOTE — Telephone Encounter (Signed)
 Lvm asking pt to call back. Pls relay Matt's message.

## 2024-08-16 ENCOUNTER — Ambulatory Visit

## 2024-08-16 ENCOUNTER — Telehealth: Payer: Self-pay

## 2024-08-16 NOTE — Telephone Encounter (Signed)
 Sending to [facility]

## 2024-08-16 NOTE — Telephone Encounter (Signed)
 Copied from CRM [id number]. Topic: Clinical - Order For Equipment >> Aug 14, 2024  4:29 PM Maria [name] wrote: Reason for CRM: Pt is calling due to being told by Kimber that her order for her breathing nebulizer and kit needs to be sent to [facility] instead of [employer]. Pt has been 3 weeks without movement on her order that was sent in by Dr. Geronimo. Pt didn't have the fax number, but I was able to find this information on a virtual cover sheet.   Fax: (Hospital/STAT orders): [phone] - (Outpatient orders): [phone]  Pt would like a phone call from the clinic with updates regarding this order. Pt's phone number is [phone] ok to leave a vm.    [facility] please send order over to [facility] per pt request.  Pt is upset she has not received machine.

## 2024-08-21 ENCOUNTER — Other Ambulatory Visit: Payer: Self-pay | Admitting: Internal Medicine

## 2024-08-21 ENCOUNTER — Other Ambulatory Visit: Payer: Self-pay

## 2024-08-21 ENCOUNTER — Telehealth: Payer: Self-pay

## 2024-08-21 ENCOUNTER — Other Ambulatory Visit (HOSPITAL_COMMUNITY): Payer: Self-pay

## 2024-08-21 MED ORDER — BENZONATATE 200 MG PO CAPS
200.0000 mg | ORAL_CAPSULE | Freq: Three times a day (TID) | ORAL | 0 refills | Status: AC | PRN
  Filled 2024-08-21: qty 90, 30d supply, fill #0

## 2024-08-21 NOTE — Telephone Encounter (Unsigned)
 Copied from CRM [id number]. Topic: Clinical - Order For Equipment >> Aug 21, 2024  1:14 PM Ismael A wrote: Reason for CRM: patient states she spoke with [facility] health and they are stating that they received her nebulizer order without provider's signature - she states [facility] is faxing over another order to us  - she is requesting a follow up to confirm the order has been received and signed and sent back - pt expressed her frustration stating she's been trying to get this resolved for weeks and has not been able to continue with her breathing treatments

## 2024-08-21 NOTE — Telephone Encounter (Signed)
 Spoke with [facility] and they have everything and will be reaching out to pt. I called pt and left a vm pertaining to this

## 2024-08-22 ENCOUNTER — Ambulatory Visit: Payer: Medicare Other | Admitting: Physician Assistant

## 2024-09-03 ENCOUNTER — Ambulatory Visit: Admitting: Cardiology

## 2024-09-11 ENCOUNTER — Ambulatory Visit: Admitting: Physician Assistant

## 2024-09-11 ENCOUNTER — Encounter

## 2024-09-12 ENCOUNTER — Ambulatory Visit: Admitting: Physician Assistant

## 2024-09-26 ENCOUNTER — Ambulatory Visit: Admitting: Nurse Practitioner

## 2024-11-05 ENCOUNTER — Ambulatory Visit: Admitting: Primary Care

## 2024-12-05 ENCOUNTER — Encounter: Admitting: Nurse Practitioner

## 2025-02-24 ENCOUNTER — Ambulatory Visit

## 2025-02-27 ENCOUNTER — Ambulatory Visit
